# Patient Record
Sex: Female | Born: 1970
Health system: Southern US, Community
[De-identification: ages and names within clinical notes are randomized; demographics above are authoritative.]

## PROBLEM LIST (undated history)

## (undated) DIAGNOSIS — R42 Dizziness and giddiness: Secondary | ICD-10-CM

## (undated) DIAGNOSIS — F1120 Opioid dependence, uncomplicated: Secondary | ICD-10-CM

## (undated) DIAGNOSIS — F4310 Post-traumatic stress disorder, unspecified: Secondary | ICD-10-CM

## (undated) DIAGNOSIS — G2581 Restless legs syndrome: Secondary | ICD-10-CM

## (undated) DIAGNOSIS — E663 Overweight: Secondary | ICD-10-CM

## (undated) DIAGNOSIS — R7301 Impaired fasting glucose: Secondary | ICD-10-CM

## (undated) DIAGNOSIS — M329 Systemic lupus erythematosus, unspecified: Secondary | ICD-10-CM

## (undated) DIAGNOSIS — K219 Gastro-esophageal reflux disease without esophagitis: Secondary | ICD-10-CM

## (undated) DIAGNOSIS — M79671 Pain in right foot: Secondary | ICD-10-CM

## (undated) DIAGNOSIS — I10 Essential (primary) hypertension: Secondary | ICD-10-CM

## (undated) DIAGNOSIS — E1165 Type 2 diabetes mellitus with hyperglycemia: Secondary | ICD-10-CM

## (undated) DIAGNOSIS — G4730 Sleep apnea, unspecified: Secondary | ICD-10-CM

## (undated) DIAGNOSIS — J301 Allergic rhinitis due to pollen: Secondary | ICD-10-CM

## (undated) DIAGNOSIS — M79609 Pain in unspecified limb: Secondary | ICD-10-CM

## (undated) DIAGNOSIS — R911 Solitary pulmonary nodule: Secondary | ICD-10-CM

## (undated) DIAGNOSIS — M797 Fibromyalgia: Secondary | ICD-10-CM

## (undated) DIAGNOSIS — Z720 Tobacco use: Secondary | ICD-10-CM

## (undated) DIAGNOSIS — I209 Angina pectoris, unspecified: Secondary | ICD-10-CM

## (undated) DIAGNOSIS — E119 Type 2 diabetes mellitus without complications: Secondary | ICD-10-CM

## (undated) DIAGNOSIS — E785 Hyperlipidemia, unspecified: Secondary | ICD-10-CM

## (undated) DIAGNOSIS — K754 Autoimmune hepatitis: Secondary | ICD-10-CM

## (undated) DIAGNOSIS — R7302 Impaired glucose tolerance (oral): Secondary | ICD-10-CM

## (undated) DIAGNOSIS — M1990 Unspecified osteoarthritis, unspecified site: Secondary | ICD-10-CM

## (undated) DIAGNOSIS — R110 Nausea: Secondary | ICD-10-CM

## (undated) DIAGNOSIS — G609 Hereditary and idiopathic neuropathy, unspecified: Secondary | ICD-10-CM

## (undated) DIAGNOSIS — H409 Unspecified glaucoma: Secondary | ICD-10-CM

## (undated) DIAGNOSIS — F329 Major depressive disorder, single episode, unspecified: Secondary | ICD-10-CM

## (undated) DIAGNOSIS — IMO0002 Reserved for concepts with insufficient information to code with codable children: Secondary | ICD-10-CM

## (undated) DIAGNOSIS — I880 Nonspecific mesenteric lymphadenitis: Secondary | ICD-10-CM

## (undated) DIAGNOSIS — M359 Systemic involvement of connective tissue, unspecified: Secondary | ICD-10-CM

## (undated) DIAGNOSIS — K589 Irritable bowel syndrome without diarrhea: Secondary | ICD-10-CM

## (undated) DIAGNOSIS — R0602 Shortness of breath: Secondary | ICD-10-CM

## (undated) DIAGNOSIS — M549 Dorsalgia, unspecified: Secondary | ICD-10-CM

## (undated) DIAGNOSIS — I499 Cardiac arrhythmia, unspecified: Secondary | ICD-10-CM

## (undated) DIAGNOSIS — I639 Cerebral infarction, unspecified: Secondary | ICD-10-CM

## (undated) DIAGNOSIS — F419 Anxiety disorder, unspecified: Secondary | ICD-10-CM

## (undated) DIAGNOSIS — I2510 Atherosclerotic heart disease of native coronary artery without angina pectoris: Secondary | ICD-10-CM

## (undated) DIAGNOSIS — F32A Depression, unspecified: Secondary | ICD-10-CM

## (undated) HISTORY — DX: Pain in right foot: M79.671

## (undated) HISTORY — DX: Post-traumatic stress disorder, unspecified: F43.10

## (undated) HISTORY — PX: BACK SURGERY: SHX140

## (undated) HISTORY — DX: Major depressive disorder, single episode, unspecified: F32.9

## (undated) HISTORY — PX: ABDOMINAL HYSTERECTOMY: SHX81

## (undated) HISTORY — PX: TOTAL ABDOMINAL HYSTERECTOMY W/ BILATERAL SALPINGOOPHORECTOMY: SHX83

## (undated) HISTORY — DX: Sleep apnea, unspecified: G47.30

## (undated) HISTORY — DX: Gastro-esophageal reflux disease without esophagitis: K21.9

## (undated) HISTORY — DX: Type 2 diabetes mellitus with hyperglycemia: E11.65

## (undated) HISTORY — PX: CHOLECYSTECTOMY: SHX55

## (undated) HISTORY — PX: TUBAL LIGATION: SHX77

## (undated) HISTORY — DX: Restless legs syndrome: G25.81

## (undated) HISTORY — DX: Cerebral infarction, unspecified: I63.9

## (undated) HISTORY — DX: Opioid dependence, uncomplicated: F11.20

## (undated) HISTORY — DX: Systemic lupus erythematosus, unspecified: M32.9

## (undated) HISTORY — DX: Atherosclerotic heart disease of native coronary artery without angina pectoris: I25.10

## (undated) HISTORY — DX: Overweight: E66.3

## (undated) HISTORY — DX: Morbid (severe) obesity due to excess calories: E66.01

## (undated) HISTORY — DX: Impaired fasting glucose: R73.01

## (undated) HISTORY — DX: Impaired glucose tolerance (oral): R73.02

## (undated) HISTORY — DX: Type 2 diabetes mellitus without complications: E11.9

## (undated) HISTORY — DX: Hereditary and idiopathic neuropathy, unspecified: G60.9

## (undated) HISTORY — DX: Allergic rhinitis due to pollen: J30.1

## (undated) HISTORY — DX: Essential (primary) hypertension: I10

## (undated) HISTORY — DX: Irritable bowel syndrome, unspecified: K58.9

## (undated) HISTORY — DX: Tobacco use: Z72.0

## (undated) HISTORY — DX: Solitary pulmonary nodule: R91.1

## (undated) HISTORY — DX: Nausea: R11.0

## (undated) HISTORY — PX: ESOPHAGOGASTRODUODENOSCOPY ENDOSCOPY: SHX5814

## (undated) HISTORY — DX: Dorsalgia, unspecified: M54.9

## (undated) HISTORY — DX: Systemic involvement of connective tissue, unspecified: M35.9

## (undated) HISTORY — PX: LIVER BIOPSY: SHX301

## (undated) HISTORY — DX: Fibromyalgia: M79.7

## (undated) HISTORY — DX: Nonspecific mesenteric lymphadenitis: I88.0

## (undated) HISTORY — DX: Hyperlipidemia, unspecified: E78.5

## (undated) HISTORY — DX: Dizziness and giddiness: R42

## (undated) HISTORY — DX: Reserved for concepts with insufficient information to code with codable children: IMO0002

## (undated) HISTORY — DX: Anxiety disorder, unspecified: F41.9

## (undated) HISTORY — DX: Pain in unspecified limb: M79.609

## (undated) HISTORY — DX: Depression, unspecified: F32.A

## (undated) HISTORY — DX: Autoimmune hepatitis: K75.4

## (undated) HISTORY — DX: Unspecified glaucoma: H40.9

---

## 2000-02-01 ENCOUNTER — Ambulatory Visit (HOSPITAL_COMMUNITY): Admission: RE | Admit: 2000-02-01 | Discharge: 2000-02-01 | Payer: Self-pay | Admitting: Pulmonary Disease

## 2000-02-01 ENCOUNTER — Encounter: Payer: Self-pay | Admitting: Pulmonary Disease

## 2000-06-15 ENCOUNTER — Emergency Department (HOSPITAL_COMMUNITY): Admission: EM | Admit: 2000-06-15 | Discharge: 2000-06-15 | Payer: Self-pay | Admitting: Emergency Medicine

## 2000-12-03 ENCOUNTER — Emergency Department (HOSPITAL_COMMUNITY): Admission: EM | Admit: 2000-12-03 | Discharge: 2000-12-03 | Payer: Self-pay | Admitting: Emergency Medicine

## 2001-01-06 ENCOUNTER — Emergency Department (HOSPITAL_COMMUNITY): Admission: EM | Admit: 2001-01-06 | Discharge: 2001-01-06 | Payer: Self-pay | Admitting: *Deleted

## 2001-01-06 ENCOUNTER — Encounter: Payer: Self-pay | Admitting: *Deleted

## 2001-02-05 ENCOUNTER — Other Ambulatory Visit: Admission: RE | Admit: 2001-02-05 | Discharge: 2001-02-05 | Payer: Self-pay | Admitting: Obstetrics and Gynecology

## 2001-03-27 ENCOUNTER — Emergency Department (HOSPITAL_COMMUNITY): Admission: EM | Admit: 2001-03-27 | Discharge: 2001-03-27 | Payer: Self-pay | Admitting: *Deleted

## 2001-06-13 ENCOUNTER — Inpatient Hospital Stay (HOSPITAL_COMMUNITY): Admission: RE | Admit: 2001-06-13 | Discharge: 2001-06-15 | Payer: Self-pay | Admitting: *Deleted

## 2002-01-21 ENCOUNTER — Emergency Department (HOSPITAL_COMMUNITY): Admission: EM | Admit: 2002-01-21 | Discharge: 2002-01-21 | Payer: Self-pay | Admitting: Emergency Medicine

## 2002-01-21 ENCOUNTER — Encounter: Payer: Self-pay | Admitting: Emergency Medicine

## 2002-02-22 ENCOUNTER — Emergency Department (HOSPITAL_COMMUNITY): Admission: EM | Admit: 2002-02-22 | Discharge: 2002-02-22 | Payer: Self-pay | Admitting: Emergency Medicine

## 2002-08-01 ENCOUNTER — Encounter: Payer: Self-pay | Admitting: Emergency Medicine

## 2002-08-01 ENCOUNTER — Emergency Department (HOSPITAL_COMMUNITY): Admission: EM | Admit: 2002-08-01 | Discharge: 2002-08-01 | Payer: Self-pay | Admitting: Emergency Medicine

## 2002-08-11 ENCOUNTER — Ambulatory Visit (HOSPITAL_COMMUNITY): Admission: RE | Admit: 2002-08-11 | Discharge: 2002-08-11 | Payer: Self-pay | Admitting: Pulmonary Disease

## 2002-08-21 ENCOUNTER — Inpatient Hospital Stay (HOSPITAL_COMMUNITY): Admission: EM | Admit: 2002-08-21 | Discharge: 2002-08-26 | Payer: Self-pay | Admitting: Internal Medicine

## 2002-08-22 ENCOUNTER — Encounter (INDEPENDENT_AMBULATORY_CARE_PROVIDER_SITE_OTHER): Payer: Self-pay | Admitting: Internal Medicine

## 2002-09-24 ENCOUNTER — Ambulatory Visit (HOSPITAL_COMMUNITY): Admission: RE | Admit: 2002-09-24 | Discharge: 2002-09-24 | Payer: Self-pay | Admitting: Internal Medicine

## 2002-09-24 ENCOUNTER — Encounter (INDEPENDENT_AMBULATORY_CARE_PROVIDER_SITE_OTHER): Payer: Self-pay | Admitting: Internal Medicine

## 2002-11-18 ENCOUNTER — Encounter (HOSPITAL_COMMUNITY): Admission: RE | Admit: 2002-11-18 | Discharge: 2002-12-18 | Payer: Self-pay | Admitting: Pulmonary Disease

## 2002-11-20 ENCOUNTER — Encounter (INDEPENDENT_AMBULATORY_CARE_PROVIDER_SITE_OTHER): Payer: Self-pay | Admitting: Internal Medicine

## 2002-11-20 ENCOUNTER — Ambulatory Visit (HOSPITAL_COMMUNITY): Admission: RE | Admit: 2002-11-20 | Discharge: 2002-11-20 | Payer: Self-pay | Admitting: Internal Medicine

## 2002-11-28 ENCOUNTER — Encounter (INDEPENDENT_AMBULATORY_CARE_PROVIDER_SITE_OTHER): Payer: Self-pay | Admitting: Internal Medicine

## 2002-11-28 ENCOUNTER — Encounter (HOSPITAL_COMMUNITY): Admission: RE | Admit: 2002-11-28 | Discharge: 2002-12-28 | Payer: Self-pay | Admitting: Internal Medicine

## 2002-12-31 ENCOUNTER — Emergency Department (HOSPITAL_COMMUNITY): Admission: EM | Admit: 2002-12-31 | Discharge: 2002-12-31 | Payer: Self-pay | Admitting: *Deleted

## 2003-01-17 ENCOUNTER — Emergency Department (HOSPITAL_COMMUNITY): Admission: EM | Admit: 2003-01-17 | Discharge: 2003-01-17 | Payer: Self-pay | Admitting: Emergency Medicine

## 2003-03-16 ENCOUNTER — Encounter: Payer: Self-pay | Admitting: Emergency Medicine

## 2003-03-16 ENCOUNTER — Emergency Department (HOSPITAL_COMMUNITY): Admission: EM | Admit: 2003-03-16 | Discharge: 2003-03-16 | Payer: Self-pay | Admitting: Emergency Medicine

## 2003-03-26 ENCOUNTER — Encounter: Payer: Self-pay | Admitting: Emergency Medicine

## 2003-03-26 ENCOUNTER — Emergency Department (HOSPITAL_COMMUNITY): Admission: EM | Admit: 2003-03-26 | Discharge: 2003-03-26 | Payer: Self-pay | Admitting: *Deleted

## 2003-06-03 ENCOUNTER — Ambulatory Visit (HOSPITAL_COMMUNITY): Admission: RE | Admit: 2003-06-03 | Discharge: 2003-06-03 | Payer: Self-pay | Admitting: Internal Medicine

## 2003-06-11 ENCOUNTER — Emergency Department (HOSPITAL_COMMUNITY): Admission: EM | Admit: 2003-06-11 | Discharge: 2003-06-11 | Payer: Self-pay | Admitting: Emergency Medicine

## 2003-08-24 ENCOUNTER — Ambulatory Visit (HOSPITAL_COMMUNITY): Admission: RE | Admit: 2003-08-24 | Discharge: 2003-08-24 | Payer: Self-pay | Admitting: Orthopedic Surgery

## 2003-09-25 ENCOUNTER — Emergency Department (HOSPITAL_COMMUNITY): Admission: EM | Admit: 2003-09-25 | Discharge: 2003-09-25 | Payer: Self-pay | Admitting: Internal Medicine

## 2003-10-28 ENCOUNTER — Ambulatory Visit (HOSPITAL_COMMUNITY): Admission: RE | Admit: 2003-10-28 | Discharge: 2003-10-28 | Payer: Self-pay | Admitting: Neurology

## 2003-11-19 ENCOUNTER — Ambulatory Visit (HOSPITAL_COMMUNITY): Admission: RE | Admit: 2003-11-19 | Discharge: 2003-11-19 | Payer: Self-pay | Admitting: Internal Medicine

## 2003-12-06 ENCOUNTER — Emergency Department (HOSPITAL_COMMUNITY): Admission: EM | Admit: 2003-12-06 | Discharge: 2003-12-06 | Payer: Self-pay | Admitting: *Deleted

## 2004-02-19 ENCOUNTER — Ambulatory Visit (HOSPITAL_COMMUNITY): Admission: RE | Admit: 2004-02-19 | Discharge: 2004-02-19 | Payer: Self-pay | Admitting: Internal Medicine

## 2004-05-19 ENCOUNTER — Ambulatory Visit (HOSPITAL_COMMUNITY): Admission: RE | Admit: 2004-05-19 | Discharge: 2004-05-19 | Payer: Self-pay

## 2004-05-19 ENCOUNTER — Ambulatory Visit (HOSPITAL_COMMUNITY): Payer: Self-pay | Admitting: Internal Medicine

## 2004-06-23 ENCOUNTER — Ambulatory Visit: Payer: Self-pay | Admitting: Internal Medicine

## 2004-07-15 ENCOUNTER — Emergency Department (HOSPITAL_COMMUNITY): Admission: EM | Admit: 2004-07-15 | Discharge: 2004-07-15 | Payer: Self-pay | Admitting: *Deleted

## 2004-07-19 ENCOUNTER — Emergency Department (HOSPITAL_COMMUNITY): Admission: EM | Admit: 2004-07-19 | Discharge: 2004-07-19 | Payer: Self-pay | Admitting: Emergency Medicine

## 2004-09-15 ENCOUNTER — Ambulatory Visit (HOSPITAL_COMMUNITY): Admission: RE | Admit: 2004-09-15 | Discharge: 2004-09-15 | Payer: Self-pay | Admitting: Neurology

## 2004-09-26 ENCOUNTER — Ambulatory Visit: Payer: Self-pay | Admitting: Internal Medicine

## 2004-11-24 ENCOUNTER — Ambulatory Visit: Payer: Self-pay | Admitting: Orthopedic Surgery

## 2004-11-28 ENCOUNTER — Ambulatory Visit (HOSPITAL_COMMUNITY): Admission: RE | Admit: 2004-11-28 | Discharge: 2004-11-28 | Payer: Self-pay

## 2004-11-29 ENCOUNTER — Ambulatory Visit (HOSPITAL_COMMUNITY): Admission: RE | Admit: 2004-11-29 | Discharge: 2004-11-29 | Payer: Self-pay | Admitting: Pulmonary Disease

## 2004-11-30 ENCOUNTER — Encounter (HOSPITAL_COMMUNITY): Admission: RE | Admit: 2004-11-30 | Discharge: 2004-12-30 | Payer: Self-pay | Admitting: Orthopedic Surgery

## 2004-12-14 ENCOUNTER — Ambulatory Visit: Payer: Self-pay | Admitting: Orthopedic Surgery

## 2005-01-02 ENCOUNTER — Encounter (HOSPITAL_COMMUNITY): Admission: RE | Admit: 2005-01-02 | Discharge: 2005-02-01 | Payer: Self-pay | Admitting: Orthopedic Surgery

## 2005-02-27 ENCOUNTER — Ambulatory Visit: Payer: Self-pay | Admitting: Internal Medicine

## 2005-03-02 ENCOUNTER — Ambulatory Visit (HOSPITAL_COMMUNITY): Admission: RE | Admit: 2005-03-02 | Discharge: 2005-03-02 | Payer: Self-pay | Admitting: Internal Medicine

## 2005-03-03 ENCOUNTER — Ambulatory Visit (HOSPITAL_COMMUNITY): Admission: RE | Admit: 2005-03-03 | Discharge: 2005-03-03 | Payer: Self-pay | Admitting: Internal Medicine

## 2005-03-03 ENCOUNTER — Ambulatory Visit: Payer: Self-pay | Admitting: Internal Medicine

## 2005-03-30 ENCOUNTER — Ambulatory Visit (HOSPITAL_COMMUNITY): Admission: RE | Admit: 2005-03-30 | Discharge: 2005-03-30 | Payer: Self-pay | Admitting: Pulmonary Disease

## 2005-05-15 ENCOUNTER — Ambulatory Visit: Payer: Self-pay | Admitting: Internal Medicine

## 2005-05-25 ENCOUNTER — Ambulatory Visit: Payer: Self-pay | Admitting: Orthopedic Surgery

## 2005-06-16 ENCOUNTER — Ambulatory Visit (HOSPITAL_COMMUNITY): Admission: RE | Admit: 2005-06-16 | Discharge: 2005-06-16 | Payer: Self-pay | Admitting: Neurology

## 2005-08-14 ENCOUNTER — Ambulatory Visit: Payer: Self-pay | Admitting: Internal Medicine

## 2005-10-30 ENCOUNTER — Ambulatory Visit: Payer: Self-pay | Admitting: Orthopedic Surgery

## 2005-12-05 ENCOUNTER — Encounter (INDEPENDENT_AMBULATORY_CARE_PROVIDER_SITE_OTHER): Payer: Self-pay | Admitting: Pathology

## 2005-12-05 ENCOUNTER — Ambulatory Visit (HOSPITAL_COMMUNITY): Admission: RE | Admit: 2005-12-05 | Discharge: 2005-12-05 | Payer: Self-pay | Admitting: General Surgery

## 2006-02-21 ENCOUNTER — Ambulatory Visit: Payer: Self-pay | Admitting: Internal Medicine

## 2006-02-28 ENCOUNTER — Ambulatory Visit (HOSPITAL_COMMUNITY): Admission: RE | Admit: 2006-02-28 | Discharge: 2006-02-28 | Payer: Self-pay | Admitting: Pulmonary Disease

## 2006-05-24 ENCOUNTER — Encounter (HOSPITAL_COMMUNITY): Admission: RE | Admit: 2006-05-24 | Discharge: 2006-06-23 | Payer: Self-pay | Admitting: Rheumatology

## 2006-06-29 ENCOUNTER — Encounter (HOSPITAL_COMMUNITY): Admission: RE | Admit: 2006-06-29 | Discharge: 2006-07-09 | Payer: Self-pay | Admitting: Rheumatology

## 2006-07-24 ENCOUNTER — Encounter (HOSPITAL_COMMUNITY): Admission: RE | Admit: 2006-07-24 | Discharge: 2006-08-23 | Payer: Self-pay | Admitting: Rheumatology

## 2006-08-20 ENCOUNTER — Ambulatory Visit: Payer: Self-pay | Admitting: Cardiovascular Disease

## 2006-08-29 ENCOUNTER — Ambulatory Visit: Payer: Self-pay | Admitting: Cardiovascular Disease

## 2006-08-29 ENCOUNTER — Ambulatory Visit (HOSPITAL_COMMUNITY): Admission: RE | Admit: 2006-08-29 | Discharge: 2006-08-29 | Payer: Self-pay | Admitting: Cardiovascular Disease

## 2006-08-30 ENCOUNTER — Ambulatory Visit: Payer: Self-pay | Admitting: Internal Medicine

## 2006-10-09 ENCOUNTER — Ambulatory Visit: Payer: Self-pay | Admitting: Cardiovascular Disease

## 2006-10-13 ENCOUNTER — Emergency Department (HOSPITAL_COMMUNITY): Admission: EM | Admit: 2006-10-13 | Discharge: 2006-10-13 | Payer: Self-pay | Admitting: Emergency Medicine

## 2007-02-18 ENCOUNTER — Ambulatory Visit (HOSPITAL_COMMUNITY): Admission: RE | Admit: 2007-02-18 | Discharge: 2007-02-18 | Payer: Self-pay | Admitting: Pulmonary Disease

## 2007-04-01 ENCOUNTER — Emergency Department (HOSPITAL_COMMUNITY): Admission: EM | Admit: 2007-04-01 | Discharge: 2007-04-01 | Payer: Self-pay | Admitting: Emergency Medicine

## 2007-05-17 ENCOUNTER — Ambulatory Visit: Payer: Self-pay | Admitting: Cardiovascular Disease

## 2007-10-09 ENCOUNTER — Ambulatory Visit (HOSPITAL_COMMUNITY): Payer: Self-pay | Admitting: Psychiatry

## 2007-10-16 ENCOUNTER — Ambulatory Visit (HOSPITAL_COMMUNITY): Payer: Self-pay | Admitting: Psychiatry

## 2007-10-23 ENCOUNTER — Ambulatory Visit (HOSPITAL_COMMUNITY): Payer: Self-pay | Admitting: Psychiatry

## 2007-10-29 ENCOUNTER — Ambulatory Visit (HOSPITAL_COMMUNITY): Payer: Self-pay | Admitting: Psychiatry

## 2007-11-06 ENCOUNTER — Ambulatory Visit (HOSPITAL_COMMUNITY): Payer: Self-pay | Admitting: Psychiatry

## 2007-11-21 ENCOUNTER — Ambulatory Visit (HOSPITAL_COMMUNITY): Payer: Self-pay | Admitting: Psychiatry

## 2007-11-26 ENCOUNTER — Ambulatory Visit (HOSPITAL_COMMUNITY): Payer: Self-pay | Admitting: Psychiatry

## 2007-12-04 ENCOUNTER — Ambulatory Visit (HOSPITAL_COMMUNITY): Payer: Self-pay | Admitting: Psychiatry

## 2007-12-10 ENCOUNTER — Ambulatory Visit (HOSPITAL_COMMUNITY): Payer: Self-pay | Admitting: Psychiatry

## 2007-12-12 ENCOUNTER — Ambulatory Visit (HOSPITAL_COMMUNITY): Payer: Self-pay | Admitting: Psychiatry

## 2007-12-18 ENCOUNTER — Ambulatory Visit (HOSPITAL_COMMUNITY): Payer: Self-pay | Admitting: Psychiatry

## 2007-12-25 ENCOUNTER — Ambulatory Visit (HOSPITAL_COMMUNITY): Payer: Self-pay | Admitting: Psychiatry

## 2007-12-31 ENCOUNTER — Ambulatory Visit (HOSPITAL_COMMUNITY): Payer: Self-pay | Admitting: Psychiatry

## 2008-01-08 ENCOUNTER — Ambulatory Visit (HOSPITAL_COMMUNITY): Payer: Self-pay | Admitting: Psychiatry

## 2008-01-15 ENCOUNTER — Ambulatory Visit (HOSPITAL_COMMUNITY): Payer: Self-pay | Admitting: Psychiatry

## 2008-01-16 ENCOUNTER — Ambulatory Visit (HOSPITAL_COMMUNITY): Payer: Self-pay | Admitting: Psychiatry

## 2008-01-23 ENCOUNTER — Ambulatory Visit (HOSPITAL_COMMUNITY): Payer: Self-pay | Admitting: Psychiatry

## 2008-01-27 ENCOUNTER — Encounter: Payer: Self-pay | Admitting: Cardiology

## 2008-01-27 LAB — CONVERTED CEMR LAB
HCT: 39.4 %
MCV: 91 fL
WBC: 11.7 10*3/uL

## 2008-03-03 ENCOUNTER — Ambulatory Visit: Payer: Self-pay | Admitting: Cardiology

## 2008-03-04 ENCOUNTER — Encounter: Payer: Self-pay | Admitting: Cardiology

## 2008-03-04 LAB — CONVERTED CEMR LAB
Cholesterol: 178 mg/dL
Glucose, Bld: 76 mg/dL
HDL: 40 mg/dL
Triglyceride fasting, serum: 151 mg/dL

## 2008-03-24 ENCOUNTER — Ambulatory Visit (HOSPITAL_COMMUNITY): Payer: Self-pay | Admitting: Psychiatry

## 2008-03-26 ENCOUNTER — Ambulatory Visit (HOSPITAL_COMMUNITY): Payer: Self-pay | Admitting: Psychiatry

## 2008-04-03 ENCOUNTER — Ambulatory Visit (HOSPITAL_COMMUNITY): Payer: Self-pay | Admitting: Psychiatry

## 2008-04-30 ENCOUNTER — Ambulatory Visit (HOSPITAL_COMMUNITY): Payer: Self-pay | Admitting: Psychiatry

## 2008-05-06 ENCOUNTER — Ambulatory Visit (HOSPITAL_COMMUNITY): Payer: Self-pay | Admitting: Psychiatry

## 2008-05-26 ENCOUNTER — Ambulatory Visit (HOSPITAL_COMMUNITY): Payer: Self-pay | Admitting: Psychiatry

## 2008-06-02 ENCOUNTER — Ambulatory Visit (HOSPITAL_COMMUNITY): Admission: RE | Admit: 2008-06-02 | Discharge: 2008-06-02 | Payer: Self-pay | Admitting: Internal Medicine

## 2008-06-02 ENCOUNTER — Ambulatory Visit (HOSPITAL_COMMUNITY): Payer: Self-pay | Admitting: Psychiatry

## 2008-07-14 ENCOUNTER — Ambulatory Visit (HOSPITAL_COMMUNITY): Payer: Self-pay | Admitting: Psychiatry

## 2008-07-23 ENCOUNTER — Ambulatory Visit (HOSPITAL_COMMUNITY): Payer: Self-pay | Admitting: Psychiatry

## 2008-08-23 ENCOUNTER — Emergency Department (HOSPITAL_COMMUNITY): Admission: EM | Admit: 2008-08-23 | Discharge: 2008-08-23 | Payer: Self-pay | Admitting: Emergency Medicine

## 2008-10-23 ENCOUNTER — Emergency Department (HOSPITAL_COMMUNITY): Admission: EM | Admit: 2008-10-23 | Discharge: 2008-10-23 | Payer: Self-pay | Admitting: Emergency Medicine

## 2008-10-30 ENCOUNTER — Ambulatory Visit (HOSPITAL_COMMUNITY): Admission: RE | Admit: 2008-10-30 | Discharge: 2008-10-30 | Payer: Self-pay | Admitting: Pulmonary Disease

## 2008-11-23 ENCOUNTER — Encounter (HOSPITAL_COMMUNITY): Admission: RE | Admit: 2008-11-23 | Discharge: 2008-12-04 | Payer: Self-pay | Admitting: Pulmonary Disease

## 2008-12-03 ENCOUNTER — Emergency Department (HOSPITAL_COMMUNITY): Admission: EM | Admit: 2008-12-03 | Discharge: 2008-12-03 | Payer: Self-pay | Admitting: Emergency Medicine

## 2008-12-25 ENCOUNTER — Ambulatory Visit (HOSPITAL_COMMUNITY): Admission: RE | Admit: 2008-12-25 | Discharge: 2008-12-25 | Payer: Self-pay | Admitting: Pulmonary Disease

## 2009-01-06 DIAGNOSIS — R609 Edema, unspecified: Secondary | ICD-10-CM

## 2009-01-06 DIAGNOSIS — IMO0001 Reserved for inherently not codable concepts without codable children: Secondary | ICD-10-CM

## 2009-07-12 ENCOUNTER — Ambulatory Visit (HOSPITAL_COMMUNITY): Admission: RE | Admit: 2009-07-12 | Discharge: 2009-07-12 | Payer: Self-pay | Admitting: Pulmonary Disease

## 2009-10-03 ENCOUNTER — Emergency Department (HOSPITAL_COMMUNITY): Admission: EM | Admit: 2009-10-03 | Discharge: 2009-10-03 | Payer: Self-pay | Admitting: Emergency Medicine

## 2009-12-21 ENCOUNTER — Encounter (HOSPITAL_COMMUNITY): Admission: RE | Admit: 2009-12-21 | Discharge: 2010-01-20 | Payer: Self-pay | Admitting: Anesthesiology

## 2010-01-21 ENCOUNTER — Encounter (HOSPITAL_COMMUNITY): Admission: RE | Admit: 2010-01-21 | Discharge: 2010-02-20 | Payer: Self-pay | Admitting: Anesthesiology

## 2010-04-11 ENCOUNTER — Encounter (INDEPENDENT_AMBULATORY_CARE_PROVIDER_SITE_OTHER): Payer: Self-pay | Admitting: *Deleted

## 2010-04-11 LAB — CONVERTED CEMR LAB
ALT: 21 units/L
Calcium: 9 mg/dL
Chloride: 104 meq/L
Creatinine, Ser: 0.59 mg/dL
Glucose, Bld: 150 mg/dL
MCV: 91.8 fL
Platelets: 308 10*3/uL
Potassium: 4.5 meq/L
RBC: 4.41 M/uL
Total Protein: 6.8 g/dL

## 2010-04-15 ENCOUNTER — Encounter (INDEPENDENT_AMBULATORY_CARE_PROVIDER_SITE_OTHER): Payer: Self-pay | Admitting: *Deleted

## 2010-04-18 ENCOUNTER — Ambulatory Visit (HOSPITAL_COMMUNITY): Admission: RE | Admit: 2010-04-18 | Discharge: 2010-04-18 | Payer: Self-pay | Admitting: Pulmonary Disease

## 2010-04-28 ENCOUNTER — Encounter (INDEPENDENT_AMBULATORY_CARE_PROVIDER_SITE_OTHER): Payer: Self-pay | Admitting: *Deleted

## 2010-04-28 ENCOUNTER — Ambulatory Visit: Payer: Self-pay | Admitting: Cardiology

## 2010-04-28 DIAGNOSIS — F17200 Nicotine dependence, unspecified, uncomplicated: Secondary | ICD-10-CM | POA: Insufficient documentation

## 2010-04-28 DIAGNOSIS — F1910 Other psychoactive substance abuse, uncomplicated: Secondary | ICD-10-CM

## 2010-04-28 DIAGNOSIS — J984 Other disorders of lung: Secondary | ICD-10-CM

## 2010-04-28 DIAGNOSIS — K754 Autoimmune hepatitis: Secondary | ICD-10-CM

## 2010-05-11 ENCOUNTER — Encounter: Payer: Self-pay | Admitting: Cardiology

## 2010-05-11 ENCOUNTER — Ambulatory Visit (HOSPITAL_COMMUNITY): Admission: RE | Admit: 2010-05-11 | Discharge: 2010-05-11 | Payer: Self-pay | Admitting: Cardiology

## 2010-05-12 ENCOUNTER — Ambulatory Visit: Payer: Self-pay | Admitting: Cardiology

## 2010-07-21 ENCOUNTER — Ambulatory Visit
Admission: RE | Admit: 2010-07-21 | Discharge: 2010-07-21 | Payer: Self-pay | Source: Home / Self Care | Attending: Cardiology | Admitting: Cardiology

## 2010-08-11 NOTE — Letter (Signed)
Summary: Stress Echocardiogram Information Sheet  [facility] at [facility]  [address], [place] [address]   Phone: [phone]  Fax: [phone]      April 28, 2010 MRN: [id number] light prior to the test.   Lindsay Walsh  Doctor: Appointment Date: Appointment Time: Appointment Location: [facility]  Stress Echocardiogram Information Sheet    Instructions:   1. DO NOT  take your furosemide,amlodipine,fexofenadine, medicine the morning of the test.  2. nothing to eat or drink  3. Dress prepared to exercise.  4. DO NOT use ANY caffine or tobacco products 3 hours before appointment.  5. Report to the Short Stay Center on the1st floor.  6. Please bring all current prescription medications.  7. If you have any questions, please call [phone]

## 2010-08-11 NOTE — Miscellaneous (Signed)
Summary: labs cbcd,cmp,04/11/2010  Clinical Lists Changes  Observations: Added new observation of CALCIUM: 9.0 mg/dL (09/81/1914 78:29) Added new observation of ALBUMIN: 3.9 g/dL (56/21/3086 57:84) Added new observation of PROTEIN, TOT: 6.8 g/dL (69/62/9528 41:32) Added new observation of SGPT (ALT): 21 units/L (04/11/2010 11:59) Added new observation of SGOT (AST): 24 units/L (04/11/2010 11:59) Added new observation of ALK PHOS: 84 units/L (04/11/2010 11:59) Added new observation of CREATININE: 0.59 mg/dL (44/07/270 53:66) Added new observation of BUN: 6 mg/dL (44/09/4740 59:56) Added new observation of BG RANDOM: 150 mg/dL (38/75/6433 29:51) Added new observation of CO2 PLSM/SER: 24 meq/L (04/11/2010 11:59) Added new observation of CL SERUM: 104 meq/L (04/11/2010 11:59) Added new observation of K SERUM: 4.5 meq/L (04/11/2010 11:59) Added new observation of NA: 138 meq/L (04/11/2010 11:59) Added new observation of PLATELETK/UL: 308 K/uL (04/11/2010 11:59) Added new observation of RDW: 13.2 % (04/11/2010 11:59) Added new observation of MCHC RBC: 34.3 g/dL (88/41/6606 30:16) Added new observation of MCV: 91.8 fL (04/11/2010 11:59) Added new observation of HCT: 40.5 % (04/11/2010 11:59) Added new observation of HGB: 13.9 g/dL (07/18/3233 57:32) Added new observation of RBC M/UL: 4.41 M/uL (04/11/2010 11:59) Added new observation of WBC COUNT: 9.5 10*3/microliter (04/11/2010 11:59)

## 2010-08-11 NOTE — Assessment & Plan Note (Signed)
Summary: ROV F1M   Visit Type:  Follow-up Primary Provider:  Dr. Kari Baars   History of Present Illness: [name] returns to the office as scheduled for continued assessment and treatment of chest discomfort.  A stress echocardiogram showed no evidence for myocardial ischemia.  She has done well symptomatically with occasional episodes that are relieved by treatment with sublingual nitroglycerin.  She describes a spell during which she felt dizzy without nausea or chest discomfort.  Using an automatic sphygmomanometer, she found her heart rate and blood pressure to be normal.  She has had no dyspnea, orthopnea, PND or pedal edema.  She has not experienced palpitations.   Current Medications (verified): 1)  Dicyclomine Hcl 10 Mg Caps (Dicyclomine Hcl) .... Take As Needed 2)  Cymbalta 30 Mg Cpep (Duloxetine Hcl) .... Take 1 Tab Two Times A Day 3)  Furosemide 40 Mg Tabs (Furosemide) .... Take 1 Tab Daily 4)  Nexium 40 Mg Cpdr (Esomeprazole Magnesium) .... Take 1 Tab Daily 5)  Amlodipine Besy-Benazepril Hcl 10-20 Mg Caps (Amlodipine Besy-Benazepril Hcl) .... Take 1 Tab Daily 6)  Methadone Hcl 5 Mg Tabs (Methadone Hcl) .... Take Qid 7)  Azathioprine 50 Mg Tabs (Azathioprine) .... Take 1 Tab Daily 8)  Vitamin C 100 Mg Tabs (Ascorbic Acid) .... Take 1 Tab Daily 9)  Fexofenadine Hcl 60 Mg Tabs (Fexofenadine Hcl) .... Take 1 Tab Two Times A Day 10)  Clonazepam 0.5 Mg Tabs (Clonazepam) .... Take As Needed 11)  Methocarbamol 500 Mg Tabs (Methocarbamol) .... Take 1 Tab Three Times A Day 12)  Pramipexole Dihydrochloride 0.5 Mg Tabs (Pramipexole Dihydrochloride) .... Take 1 Tab At Bedtime 13)  Zolpidem Tartrate 10 Mg Tabs (Zolpidem Tartrate) .... Take As Needed 14)  Oscal 500/200 D-3 500-200 Mg-Unit Tabs (Calcium-Vitamin D) .... Take 1 Tab Two Times A Day 15)  Levetiracetam 750 Mg Tabs (Levetiracetam) .... Take 1 Tab At Bedtime 16)  Alora 0.075 Mg/24hr Pttw (Estradiol) .... Apply 1 Time  Weekly 17)  Vitamin B-12 1000 Mcg Tabs (Cyanocobalamin) .... Take 1 Tab Daily 18)  Daily Multi  Tabs (Multiple Vitamins-Minerals) .... Take 1 Tab Daily 19)  Nitrostat 0.4 Mg Subl (Nitroglycerin) .Marland Kitchen.. 1 Tablet Under Tongue At Onset of Chest Pain; You May Repeat Every 5 Minutes For Up To 3 Doses.  Allergies (verified): 1)  ! Doxycycline  Comments:  Nurse/Medical Assistant: patient didn't bring med or list [facility] is pharmacy reviewed meds from previous med list  Past History:  PMH, FH, and Social History reviewed and updated.  Past Medical History: PVC's Hypertension Chest pain: normal myoview and echo-2008; normal stress echo 2011 Pedal edema Tobacco abuse: 30-40 pack years; continuing at one pack per day Globus hystericus; gastroesophageal reflux disease Fibromyalgia; irritable bowel syndrome Overweight; sleep apnea Autoimmune hepatitis Narcotic Dependence; depression Pulmonary Nodule    Review of Systems       See history of present illness.  Vital Signs:  Patient profile:   40 year old female Weight:      190 pounds O2 Sat:      97 % on Room air Pulse rate:   102 / minute BP sitting:   124 / 83  (left arm)  Vitals Entered By: Dreama Saa, CNA (July 21, 2010 11:40 AM)  O2 Flow:  Room air  Physical Exam  General:  Overweight; well developed; no acute distress:   Weight-190, 5 pounds less than at her last visit Neck-No JVD; no carotid bruits: Lungs-No tachypnea, no rales; no  rhonchi; no wheezes: Cardiovascular-normal PMI; normal S1 and S2: Abdomen-BS normal; soft and non-tender without masses or organomegaly:  Musculoskeletal-No deformities, no cyanosis or clubbing: Neurologic-Normal cranial nerves; symmetric strength and tone:  Skin-Warm, no significant lesions: Extremities-Nl distal pulses; no edema:     Impression & Recommendations:  Problem # 1:  CHEST PAIN (ICD-786.50) Symptoms are currently well controlled.  Possible etiologies include  coronary spasm, syndrome X, esophageal spasm, anxiety or a host of other conditions, which are less likely.  None of these is likely to be associated with significant risk for mortality or important morbidity.  She does not appear to have ischemic heart disease.  Additional testing would likely prove futile.  She wonders about her episode of dizziness.  With normal blood pressure and heart rate,  a significant arrhythmia is likely excluded.  I advised her that it is difficult to determine the etiology of her symptoms, and that we would consider performing additional testing for a recurrent event.  Problem # 2:  TOBACCO ABUSE (ICD-305.1) Patient initially was abstinent from tobacco with the use of transdermal patches, but subsequently experienced increased stress leading her to smoke again.  I emphasized the need for a continuing commitment and multiple quit attempts.  We have referred her to the [facility] where nicotine replacement therapy is currently available at no cost.  She will see the cardiology nurse in one month to review her progress.  I will reassess this nice woman in one year.  Patient Instructions: 1)  Your physician recommends that you schedule a follow-up appointment in: 1 year 2)  You have been referred to nurse visit in 1 month to assess tobacco cessation and bp check

## 2010-08-11 NOTE — Assessment & Plan Note (Signed)
Summary: F2Y PT LAST SEEN 02/2009 CP TIGHTNESS IN CHEST/TMJ   Visit Type:  Follow-up Primary Provider:  Dr. Kari Baars   History of Present Illness: [name] returns to the office as scheduled for continued assessment and treatment of chest discomfort.  She had done well following her most recent visit in August of 2009, but has intermittently experienced exacerbation of chest discomfort, most recently over the past few months.  She experiences a deep pain in the left chest with onset at rest.  Typical spells persist for approximately 45 minutes.  She is unaware of anything she can do to ameliorate or exacerbate her discomfort.  There is some associated dyspnea but not much in way of diaphoresis.  She has had intermittent nausea as well.  Current Medications (verified): 1)  Dicyclomine Hcl 10 Mg Caps (Dicyclomine Hcl) .... Take As Needed 2)  Cymbalta 30 Mg Cpep (Duloxetine Hcl) .... Take 1 Tab Two Times A Day 3)  Furosemide 40 Mg Tabs (Furosemide) .... Take 1 Tab Daily 4)  Nexium 40 Mg Cpdr (Esomeprazole Magnesium) .... Take 1 Tab Daily 5)  Amlodipine Besy-Benazepril Hcl 10-20 Mg Caps (Amlodipine Besy-Benazepril Hcl) .... Take 1 Tab Daily 6)  Methadone Hcl 5 Mg Tabs (Methadone Hcl) .... Take As Needed 7)  Azathioprine 50 Mg Tabs (Azathioprine) .... Take 1 Tab Daily 8)  Vitamin C 100 Mg Tabs (Ascorbic Acid) .... Take 1 Tab Daily 9)  Fexofenadine Hcl 60 Mg Tabs (Fexofenadine Hcl) .... Take 1 Tab Two Times A Day 10)  Clonazepam 0.5 Mg Tabs (Clonazepam) .... Take 1 Tab Two Times A Day 11)  Methocarbamol 500 Mg Tabs (Methocarbamol) .... Take 1 Tab Three Times A Day 12)  Pramipexole Dihydrochloride 0.5 Mg Tabs (Pramipexole Dihydrochloride) .... Take 1 Tab At Bedtime 13)  Zolpidem Tartrate 10 Mg Tabs (Zolpidem Tartrate) .... Take 1 Tab At Bedtime 14)  Oscal 500/200 D-3 500-200 Mg-Unit Tabs (Calcium-Vitamin D) .... Take 1 Tab Two Times A Day 15)  Levetiracetam 750 Mg Tabs  (Levetiracetam) .... Take 1 Tab At Bedtime 16)  Alora 0.075 Mg/24hr Pttw (Estradiol) .... Apply 1 Time Weekly 17)  Vitamin B-12 1000 Mcg Tabs (Cyanocobalamin) .... Take 1 Tab Daily 18)  Daily Multi  Tabs (Multiple Vitamins-Minerals) .... Take 1 Tab Daily 19)  Nitrostat 0.4 Mg Subl (Nitroglycerin) .Marland Kitchen.. 1 Tablet Under Tongue At Onset of Chest Pain; You May Repeat Every 5 Minutes For Up To 3 Doses.  Allergies (verified): 1)  ! Doxycycline  Comments:  Nurse/Medical Assistant: patient brought med bottles states she takes just like bottle reads  Past History:  PMH, FH, and Social History reviewed and updated.  Past Medical History: PVC's Hypertension CHEST PAIN-UNSPECIFIED (ICD-786.50) normal myoview 2008 normal echo 2008 Pedal edema Tobacco abuse: 30-40 pack years; continuing at one pack per day Globus hystericus; gastroesophageal reflux disease Fibromyalgia; irritable bowel syndrome Overweight; sleep apnea Autoimmune hepatitis Narcotic Dependence; depression Pulmonary Nodule    Social History: Disabled  Tobacco Use - Yes.  1 1/2 packs dailyx30 years Alcohol Use - yes - occasional Drug Use - no  Review of Systems  The patient denies weight loss, weight gain, syncope, peripheral edema, prolonged cough, and headaches.     Vital Signs:  Patient profile:   40 year old female Height:      60 inches Weight:      195 pounds BMI:     38.22 Pulse rate:   99 / minute BP sitting:   110 / 69  (  right arm)  Vitals Entered By: Dreama Saa, CNA (April 28, 2010 1:46 PM)  Physical Exam  General:  overweight white female in no distress. Weight is 195, which continues to increase from 177 in April.  HEENT:  Unremarkable. Carotids are without bruit.  No lymphadenopathy, thyromegaly, or JVP elevation. LUNGS:  Clear with good diaphragmatic motion.  No wheezing. S1 and S2.  Distant heart sounds.  PMI is not palpable. ABDOMEN:  Benign.  She is obese.  No tenderness.  No AAA.   No hepatosplenomegaly or hepatojugular reflux. Distal pulses are intact.  No edema. NEURO:  Nonfocal. SKIN:  Warm and dry.  No muscular weakness.    Impression & Recommendations:  Problem # 1:  CHEST PAIN (ICD-786.50) Chest discomfort persists.  A- stress nuclear study was performed nearly 4 years ago.  We will proceed with a stress echocardiogram.  Patient will try nitroglycerin for her symptoms.  Problem # 2:  HYPERTENSION (ICD-401.1) Blood pressure control is good with current medication, which will be continued.  Problem # 3:  TOBACCO ABUSE (ICD-305.1) Patient expressed an interest in discontinuing cigarette smoking.  She previously used transdermal nicotine systems with some benefit; however, she stopped when she could no longer afford to purchase these.  I explained that Medicaid will provide coverage for that type of smoking cessation therapy.  She will resume her efforts to discontinue tobacco use immediately.  I will reassess this nice woman in 2 months.  Other Orders: Stress Echo (Stress Echo)  Patient Instructions: 1)  Your physician recommends that you schedule a follow-up appointment in: 1 month 2)  Your physician has recommended you make the following change in your medication: nitrogllycerin 0.4mg  under tongue every 5 monutes x3 for chest pain, nicotine patches 3)  Your physician has requested that you have a stress echocardiogram. For further information please visit [email or web address]  Please follow instruction sheet as given. Prescriptions: NITROSTAT 0.4 MG SUBL (NITROGLYCERIN) 1 tablet under tongue at onset of chest pain; you may repeat every 5 minutes for up to 3 doses.  #25 x 3   Entered by:   Teressa Lower RN   Authorized by:   Kathlen Brunswick, MD, [facility]   Signed by:   Teressa Lower RN on 04/28/2010   Method used:   Electronically to        [facility] (retail)       [address]       [place], [place]  [address]        Ph: [phone]       Fax: [phone]   RxID:   [id number]

## 2010-08-22 ENCOUNTER — Encounter (INDEPENDENT_AMBULATORY_CARE_PROVIDER_SITE_OTHER): Payer: Self-pay | Admitting: *Deleted

## 2010-08-22 ENCOUNTER — Ambulatory Visit: Payer: Self-pay

## 2010-08-30 ENCOUNTER — Ambulatory Visit (INDEPENDENT_AMBULATORY_CARE_PROVIDER_SITE_OTHER): Payer: Medicare Other | Admitting: Internal Medicine

## 2010-08-30 DIAGNOSIS — R1011 Right upper quadrant pain: Secondary | ICD-10-CM

## 2010-08-30 DIAGNOSIS — R1013 Epigastric pain: Secondary | ICD-10-CM

## 2010-08-31 NOTE — Letter (Signed)
Summary: Appointment - Missed  [facility] [place]  [address] [place] [address]   Phone: [phone]  Fax: [phone]     August 22, 2010 MRN: [id number]   Lindsay Walsh [address] [place], [place]  [address]   Dear Ms. Hinote,  Our records indicate you missed your appointment on     08/22/10 NURSE VISIT.                                    It is very important that we reach you to reschedule this appointment. We look forward to participating in your health care needs. Please contact us at the number listed above at your earliest convenience to reschedule this appointment.     Sincerely,    Glass blower/designer

## 2010-09-13 ENCOUNTER — Encounter (HOSPITAL_BASED_OUTPATIENT_CLINIC_OR_DEPARTMENT_OTHER): Payer: Medicare Other | Admitting: Internal Medicine

## 2010-09-13 ENCOUNTER — Ambulatory Visit (HOSPITAL_COMMUNITY)
Admission: RE | Admit: 2010-09-13 | Discharge: 2010-09-13 | Disposition: A | Discharge status: Home / Self Care | Payer: Medicare Other | Source: Ambulatory Visit | Attending: Internal Medicine | Admitting: Internal Medicine

## 2010-09-13 DIAGNOSIS — I10 Essential (primary) hypertension: Secondary | ICD-10-CM | POA: Insufficient documentation

## 2010-09-13 DIAGNOSIS — Z79899 Other long term (current) drug therapy: Secondary | ICD-10-CM | POA: Insufficient documentation

## 2010-09-13 DIAGNOSIS — R1013 Epigastric pain: Secondary | ICD-10-CM

## 2010-09-13 DIAGNOSIS — K2960 Other gastritis without bleeding: Secondary | ICD-10-CM

## 2010-09-13 DIAGNOSIS — K219 Gastro-esophageal reflux disease without esophagitis: Secondary | ICD-10-CM

## 2010-09-13 DIAGNOSIS — R1011 Right upper quadrant pain: Secondary | ICD-10-CM

## 2010-09-13 HISTORY — PX: UPPER GASTROINTESTINAL ENDOSCOPY: SHX188

## 2010-10-10 NOTE — Consult Note (Signed)
Lindsay Walsh, Lindsay Walsh              ACCOUNT NO.:  [id number]  MEDICAL RECORD NO.:  [id number]           PATIENT TYPE:  AMB  LOCATION: [place]                    [facility]   PHYSICIAN:  Lionel December, M.D.    DATE OF BIRTH:  1971-05-25  DATE:08/30/2010                                 OFFICE VISIT.   PRESENTING COMPLAINT:  Recurrent epigastric and right upper quadrant pain.  The patient has history of autoimmune hepatitis and remains in remission.  HISTORY OF PRESENT ILLNESS:  Lindsay Walsh is a 40 year old Caucasian female patient of Dr. Juanetta Gosling who is here for scheduled visit.  She was last seen by me in [place] office.  She continues to complain of pain in her epigastric and right upper quadrant region.  She has had this pain for the past couple of years.  She states lately pain has gotten worse and experiences every day.  It is intermittent and at times may last for hours.  She states pain can be very intense and sort of immobilize her and she cannot even stand up or do anything until the pain goes away. She has been using heating pad which helps.  She is not sure if this pain is triggered with food or any activity.  She had laparoscopic cholecystectomy by Dr. Toy Cookey of [facility] in December 2010.  She states she was pain-free for only 8 or 10 days.  She did have chronic cholecystitis on a biopsy.  The patient was seen by Dr. Larey Dresser, her hepatologist at the [facility] on August 08, 2010.  He thought her upper GI tract should be examined to make sure she does not have PUD or any other lesion to account for these symptoms.  Her transaminases have remained normal.  She denies nausea or vomiting.  She states her appetite generally is good.  She has not lost any weight recently.  She has history of IBS. She states on most days she has 3-4 bowel movements and they could be loose to soft.  On her worse days, she may have 10 stools in a day and this happens no more than once  a month.  She states that if she does not taper Bentyl, stool frequency increases.  She does complain of intermittent bloating.  She denies melena, rectal bleeding, or dysphagia.  She states her heartburn is well controlled with PPI and diet.  CURRENT MEDICATIONS: 1. Fexofenadine 60 mg p.o. b.i.d. 2. Clonazepam 0.5 mg p.o. daily p.r.n. 3. Methadone 5 mg p.o. q.i.d. 4. Sulfamethoxazole DS b.i.d. 5. Nexium 40 mg p.o. q.a.m. 6. Azathioprine 50 mg daily. 7. Amlodipine/benazepril 10/20 daily. 8. Furosemide 40 mg daily. 9. Cymbalta 30 mg b.i.d. 10.Zolpidem 10 mg p.o. at bedtime p.r.n. 11.Dicyclomine 10 mg 3 times a day. 12.Pramipexole 0.25 mg p.o. at bedtime. 13.Methocarbamol 500 mg daily p.r.n. 14.Keppra 750 mg p.o. at bedtime. 15.Vitamin C 500 mg daily. 16.Vitamin B12 of 2.5 mg daily. 17.Nitroglycerin sublingual p.r.n.. 18.Os-Cal 500 mg b.i.d. 19.Vitamin D3 question dose b.i.d. 20.Centrum 1 daily. 21.Estradiol patch 0.075 mg patch once a week.  PAST MEDICAL HISTORY:  She has had 3 benign cysts removed from her left breast.  Last one was removed with Dr. Malvin Johns possibly last year.  She had BSO with hysterectomy in 2002 and prior to that, she had C-section x3.  She was diagnosed with autoimmune hepatitis in 2004 when she presented with bilirubin in teens and SGOT and SGPT over 2000.  She had repeat liver biopsy in 2007.  It is unclear whether or not she had a biopsy at the time of laparoscopic cholecystectomy.  At any rate, she has remained in remission for number of years.  Lab studies from January 2012 from [facility], her WBC was 8.5, hemoglobin 13.2, and platelet count 302,000.  Bilirubin was 0.4, AP 108, SGOT 30, SGPT 21, and albumin was 3.7.  IgG was 1340. INR was 1, and creatinine was 0.8.  She also has fibromyalgia, peripheral neuropathy, and IBS.  She has a chronic low back pain and she is being treated at the [facility] and she states she has had 3-4 injections.  She was  recently found to have a lung nodule by Dr. Juanetta Gosling and is scheduled to have a follow up scan in a few months.  She has chronic GERD and hypertension.  She also has arrhythmia, details not available.  ALLERGIES: 1. DOXYCYCLINE. 2. IODINE DYE. 3. FENTANYL PATCH.  FAMILY HISTORY:  Father is 66 years old, he is a [place] veteran and has multiple medical problems including CAD, DM, and neuropathy.  Mother is in good health.  She has a half-sister and half-brother who are both in good health.  SOCIAL HISTORY:  She is divorced.  She has 3 children.  Her son has some heart problems and goes to [employer].  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  She does not drink alcohol but still smoking about a pack a day which she has done for over 10 years.  OBJECTIVE:  VITAL SIGNS:  Weight 189 pounds, she is 60 inches tall. Pulse 74 per minute, blood pressure 126/70, and temperature is 98.7. HEENT:  Conjunctivae are pink.  Sclerae are nonicteric.  Oral pharyngeal mucosa is normal. NECK;  No neck masses or thyromegaly noted. CARDIAC:  Regular rhythm.  Normal S1 and S2.  No murmur or gallop noted. LUNGS:  Clear to auscultation. ABDOMEN:  Full.  Bowel sounds are normal.  On palpation, she has soft abdomen without hepatosplenomegaly.  She has mild mid epigastric tenderness as well as some tenderness below the right costal margin both on superficial and deep palpation. RECTAL:  Deferred. EXTREMITIES:  No peripheral edema or clubbing noted.  LABORATORY DATA:  As above.  ASSESSMENT:  Lindsay Walsh is a 40 year old Caucasian female who has history of autoimmune hepatitis and remains in biochemical histologic remission. She also has chronic gastroesophageal reflux disease with satisfactory control of her heartburn and irritable bowel syndrome with reasonable control of her bowel frequency.  She continues to experience pain in her epigastric region and right upper quadrant.  She had laparoscopic  cholecystectomy in December 2010, without sustained relief.  I suspect her pain is wall pain and maybe related to fibromyalgia or neuropathy.  We will proceed with diagnostic peptic ulcer disease as has been recommended by Dr. Larey Dresser to make sure we are not missing significant GI disease in upper GI tract that might explain her recurrent pain.  RECOMMENDATIONS:  Diagnostic esophagogastroduodenoscopy to be performed at [facility] in near future.  The patient counseled about the need to quit cigarette smoking.  If she needs some therapy, she will discuss with Dr. Juanetta Gosling on her next  visit.     Lionel December, M.D.     NR/MEDQ  D:  08/30/2010  T:  08/31/2010  Job:  [id number]  cc:   Ramon Dredge [name], M.D. Fax: [phone]  Larey Dresser, MD [facility]  Electronically Signed by Lionel December M.D. on 10/10/2010 09:35:28 AM

## 2010-10-10 NOTE — Op Note (Signed)
  NAMEFRAN, NEISWONGER              ACCOUNT NO.:  [id number]  MEDICAL RECORD NO.:  [id number]           PATIENT TYPE:  O  LOCATION:  [place]                          FACILITY:  [facility]  PHYSICIAN:  Lionel December, M.D.    DATE OF BIRTH:  Jun 27, 1971  DATE OF PROCEDURE:  09/13/2010 DATE OF DISCHARGE:                              OPERATIVE REPORT   PROCEDURE:  Esophagogastroduodenoscopy.  INDICATION:  Demoni is 40 year old Caucasian female with multiple medical problems including autoimmune hepatitis which was in remission, chronic GERD maintained on PPI, and history of IBS, who keeps experiencing epigastric in right upper quadrant pain.  At one point, it was felt her symptoms are due to biliary tract disease.  She had laparoscopic cholecystectomy at [facility] in December 2010, but without symptomatic relief.  She is undergoing diagnostic EGD to make sure she does not have peptic ulcer disease.  Procedures were reviewed with the patient. Informed consent was obtained.  MEDS FOR CONSCIOUS SEDATION:  Cetacaine spray for oropharyngeal topical anesthesia, Demerol 50 mg IV, Versed 8 mg IV in divided dose.  FINDINGS:  Procedure performed in endoscopy suite.  The patient's vital signs and O2 sat were monitored during the procedure and remained stable.  The patient was placed in left lateral recumbent position and Pentax videoscope was passed through oropharynx without any difficulty into esophagus.  Esophagus.  Mucosa of the esophagus normal.  GE junction was located at 36 cm from the incisors and was unremarkable.  Stomach.  It had moderate amount of food debris.  Examination of stomach was somewhat limited.  Multiple antral erosions were noted, however, no ulcer crater was found.  Pyloric channel was patent.  Angularis, fundus, and cardia were examined by retroflexion of scope and were normal.  Only a part of mucosa and gastric body was not seen because of food debris.  Duodenum.  Bulbar mucosa  was normal.  Scope was passed in second part of duodenum where mucosa and folds were normal.  Endoscope was withdrawn. The patient tolerated the procedure well.  FINAL DIAGNOSES: 1. No evidence of peptic ulcer disease. 2. Multiple antral erosions.  We will check her H. pylori serology. 3. Food debris in the stomach with a patent pylorus.  Suspect she may     have gastroparesis secondary to her meds.  She is on methadone     dicyclomine, etc.  RECOMMENDATIONS: 1. She will continue antireflux measures and Nexium as before.  The     patient advised to begin gastroparesis diet.  We will give her a     handout before she leaves the facility. 2. H. pylori serology to be to checked today.     Lionel December, M.D.     NR/MEDQ  D:  09/13/2010  T:  09/13/2010  Job:  [id number]  cc:   Dr. Juanetta Gosling  Dr. Larey Dresser [facility]  Electronically Signed by Lionel December M.D. on 10/10/2010 09:37:31 AM

## 2010-10-15 ENCOUNTER — Emergency Department (HOSPITAL_COMMUNITY)
Admission: EM | Admit: 2010-10-15 | Discharge: 2010-10-15 | Disposition: A | Discharge status: Home / Self Care | Payer: Medicare Other | Attending: Emergency Medicine | Admitting: Emergency Medicine

## 2010-10-15 DIAGNOSIS — F17200 Nicotine dependence, unspecified, uncomplicated: Secondary | ICD-10-CM | POA: Insufficient documentation

## 2010-10-15 LAB — RAPID STREP SCREEN (MED CTR MEBANE ONLY): Streptococcus, Group A Screen (Direct): NEGATIVE

## 2010-10-18 ENCOUNTER — Ambulatory Visit (INDEPENDENT_AMBULATORY_CARE_PROVIDER_SITE_OTHER): Payer: Medicare Other | Admitting: Psychiatry

## 2010-10-18 DIAGNOSIS — F331 Major depressive disorder, recurrent, moderate: Secondary | ICD-10-CM

## 2010-10-19 NOTE — Progress Notes (Signed)
[name], Lindsay Walsh              ACCOUNT NO.:  [id number]  MEDICAL RECORD NO.:  [id number]           PATIENT TYPE:  A  LOCATION:  [facility]                           FACILITY:  [facility]  PHYSICIAN:  Jailey Booton T. Jaeley Wiker, M.D.   DATE OF BIRTH:  [birth year]                                PROGRESS NOTE   HISTORY OF PRESENT ILLNESS: The patient is 40 year old Caucasian female who has been seen in this office, the last time in January 2010.  She came again today after she was feeling more depressed and having crying spells and needed help. The patient told that she stopped coming because she was feeling better but for the past few months, she feels that her depression is getting worse.  She did not mention any acute stressor however reported that she still dealing with her chronic illness, lack of family support, and going through the grief of losing her ex-boyfriend a few years ago.  The patient's current medications remain the same.  She is still on Cymbalta 30 mg twice a day, Klonopin 0.5 mg twice a day, and Ambien 6.25 mg as needed.  She endorses poor sleep, decreased energy, and frequent mood swings.  She stays most of this time to herself but at times feeling hopeless and helpless.  However, she denies any suicidal thinking, homicidal thinking, or any paranoia.  She recently moved from her apartment to her grandmother's house who died last year, and now her aunt who inherited that house is renting to her.  She feels better since she moved one week ago from her apartment to her grandmother's house. She is more comfortable there.  She is living with her 73 sons, 20 years old and 64 years old, and her 37 year old daughter lives on her own. Her medical condition remains the same.  She is still taking multiple medications for her physical illness.  She has a history of autoimmune hepatitis, peripheral neuropathy, fibromyalgia, history of benign cyst removed from her left breast, history of hysterectomy,  and IBS.  She also has chronic low back pain which she is getting treated at pain clinic at [facility].  She has recently found a lung nodule by Dr. Juanetta Gosling and she is scheduled to have a follow-up scan in a few months.  She also has a chronic heart condition.  She has chronic hypertension.  CURRENT MEDICATIONS: 1. Fexofenadine 60 mg twice a day. 2. Clonazepam 0.5 mg twice a day as needed. 3. Methadone 5 mg q.i.d. 4. Sulfamethazole DS b.i.d. 5. Nexium 40 mg in the morning. 6. Azathioprine  50 mg daily. 7. Amlodipine/benazepril 10/20 mg daily. 8. Lasix 40 mg daily. 9. Cymbalta 30 mg twice a day. 10.Ambien 6.25 mg daily as needed. 11.Dicyclomine 10 mg 3 times a day. 12.Methocarbamol 500 mg as needed. 13.Paxil 0.25 mg at bedtime. 14.Keppra 750 mg at bedtime. 15.Vitamin C 500 mg daily. 16.Vitamin B12 daily. 17.Nitroglycerin sublingual p.r.n. 18.Calcium 500 mg twice a day. 19.Vitamin D3 twice a day. 20.Estradiol patch 0.075 once a week.  She is seeing a doctor at [facility] for her chronic liver disease.  So far, she was told that her  liver has been stable with current medication. However, if these current medications stop working, then she may need to go on liver transplant list.  MENTAL STATUS EXAMINATION: The patient is a middle-aged woman, mildly obese, casually dressed, had fair eye contact.  She is tearful and anxious and described her mood as depressed and her affect was constricted.  There was no suicidal thinking, homicidal thinking, paranoia, or delusion present.  Her thought processes were linear, logical, and goal-directed.  Her speech is also soft and coherent.  She is alert and oriented x3.  There was no delusion or obsession noted.  Her attention and concentration were okay. Her insight, judgement, and impulse control were okay.  DIAGNOSIS: Axis I:  Major depressive disorder, recurrent. Axis II:  Deferred. Axis III:  See medical history. Axis IV:  Chronic psychiatric  illness, multiple medical problems, limited support. Axis V:  60.  PLAN: I talked to the patient in detail and discuss about her risk and benefits of medication.  At this time, she is taking multiple medications including Cymbalta, Klonopin, and Ambien with no side effects.  The patient is very reluctant to try a new medication given the fact that she has autoimmune hepatitis, however she shows a great interest for counseling in this office.  She has seen in the past Peggy Bynum in our office which had helped her a lot.  I do believe that she will get much help if she resumed the counseling in this office. However, if she does not show much improvement with the therapy, I am going to reconsider changing her antidepressant.  I have explained the risks and benefits of medication in detail.  I will see her again in 6 weeks and the patient is scheduled to see a therapist in this office as soon as possible.     Emery Binz T. Lolly Mustache, M.D.     STA/MEDQ  D:  10/18/2010  T:  10/18/2010  Job:  [id number]  Electronically Signed by Kathryne Sharper M.D. on 10/19/2010 01:57:08 PM

## 2010-10-25 LAB — COMPREHENSIVE METABOLIC PANEL
AST: 23 U/L (ref 0–37)
Alkaline Phosphatase: 82 U/L (ref 39–117)
Calcium: 8.6 mg/dL (ref 8.4–10.5)
GFR calc Af Amer: >60 mL/min (ref >60)
GFR calc non Af Amer: >60 mL/min (ref >60)
Total Bilirubin: 0.7 mg/dL (ref 0.3–1.2)
Total Protein: 6.7 g/dL (ref 6.0–8.3)

## 2010-10-25 LAB — DIFFERENTIAL
Basophils Absolute: 0 10*3/uL (ref 0.0–0.1)
Eosinophils Absolute: 0.1 10*3/uL (ref 0.0–0.7)
Lymphocytes Relative: 3 % — ABNORMAL LOW (ref 12–46)
Monocytes Absolute: 0.7 10*3/uL (ref 0.1–1.0)
Monocytes Relative: 5 % (ref 3–12)
Neutro Abs: 13.8 10*3/uL — ABNORMAL HIGH (ref 1.7–7.7)
Neutrophils Relative %: 92 % — ABNORMAL HIGH (ref 43–77)

## 2010-10-25 LAB — URINALYSIS, ROUTINE W REFLEX MICROSCOPIC

## 2010-10-25 LAB — LIPASE, BLOOD: Lipase: 25 U/L (ref 11–59)

## 2010-10-25 LAB — CBC
MCHC: 34.9 g/dL (ref 30.0–36.0)
Platelets: 254 10*3/uL (ref 150–400)
RBC: 4.28 MIL/uL (ref 3.87–5.11)

## 2010-11-11 ENCOUNTER — Ambulatory Visit (INDEPENDENT_AMBULATORY_CARE_PROVIDER_SITE_OTHER): Payer: Medicare Other | Admitting: Psychiatry

## 2010-11-11 DIAGNOSIS — F331 Major depressive disorder, recurrent, moderate: Secondary | ICD-10-CM

## 2010-11-22 ENCOUNTER — Encounter (INDEPENDENT_AMBULATORY_CARE_PROVIDER_SITE_OTHER): Payer: Medicare Other | Admitting: Psychiatry

## 2010-11-22 DIAGNOSIS — F331 Major depressive disorder, recurrent, moderate: Secondary | ICD-10-CM

## 2010-11-22 NOTE — Letter (Signed)
March 03, 2008    Ramon Dredge [name], M.D.  [address]  [place], [place] [address]   RE:  RUHI, KOPKE  MRN:  [id number]  /  DOB:  06-13-1971   Dear Ed:   Ms. Demma returns to the office, having originally been followed by  me, subsequently by Dr. [name] and now back to my practice.  She first  presented in 1998 with chest discomfort.  This was attributed to  fibromyalgia.  She also has had mild hypertension that has been  extremely well controlled with medical therapy.  Lipid status has been  normal.  She has not had diabetes.  She is followed at [facility] for  autoimmune hepatitis.  Previous cardiac testing showed only frequent  PVCs that were not clearly symptomatic.  She had a negative stress  nuclear study in 2008 with normal left ventricular systolic function.   Since she last saw Dr. [name], she has done well.  Pain control is much  improved - she is no longer using narcotics.  She does intermittently  have chest discomfort that resolved spontaneously.  There is no  relationship to exertion.   CURRENT MEDICATIONS:  1. Imuran 50 mg daily.  2. Norflex 100 mg b.i.d.  3. Bentyl 10 mg q.i.d. p.r.n.  4. Multivitamin.  5. Os-Cal with vitamin D.  6. Estradiol.  7. Lotrel 10/20 mg daily.  8. Furosemide 20 mg every other day p.r.n.  9. Keppra 750 mg nightly.  10.Protonix 20 mg daily.  11.Diazepam 5 mg t.i.d. p.r.n.  12.Transdermal lidocaine.  13.Cymbalta 60 mg daily.   PHYSICAL EXAMINATION:  GENERAL:  Overweight pleasant woman.  VITAL SIGNS:  The weight is 186, 1 pound more than November 2008.  Blood  pressure 105/70, heart rate 75 and regular, respirations 15.  NECK:  No jugular venous distention; no carotid bruits.  LUNGS:  Clear.  CARDIAC:  Normal first and second heart sounds.  ABDOMEN:  Soft and nontender; no masses; no organomegaly.  EXTREMITIES:  Trace edema; decreased to absent dorsalis pedis pulses;  adequate posterior tibial pulses.   IMPRESSION:  Ms.  Mcafee is doing much better in terms of her  fibromyalgia and pain management.  Blood pressure control remains  excellent.  A chemistry profile and lipid profile will be obtained.  If  results are good, I will reassess this nice woman in 1 year.    Sincerely,      Gerrit Friends. Dietrich Pates, MD, [facility]  Electronically Signed    RMR/MedQ  DD: 03/03/2008  DT: 03/04/2008  Job #: [id number]

## 2010-11-22 NOTE — Assessment & Plan Note (Signed)
[facility]                       [place] [facility]ARDIOLOG[facility] OFFICE NOTE   Lindsay Walsh, Lindsay Walsh                     MRN:          [id number]  DATE:05/17/2007                            DOB:          05/14/1971    Lindsay Walsh returns today for followup.  I have seen her for somatic  complaints before.  She has significant fibromyalgia over the last 3-4  years.  When I last saw her, she desperately needed to simplify her  medications.  She told me that she has been taken off Lyrica since I  last saw her.  She also has gone to [facility] and had some sort of L4-5 nerve  block.  She has ongoing appointments up there.  She continues to have  atypical chest pain.  The pain is sharp in the center of her chest.  It  is not related to exertion.  She gets occasional lightheadedness with  it.  There is no associated diaphoresis or shortness of breath.  The  pain can happen 1-2 times per week.  It is the same pain she has had all  along.  She had a nonischemic Myoview in February of this year and a  normal echo as well.  I am convinced that these pains are noncardiac in  etiology.   REVIEW OF SYSTEMS:  Remarkable for persistent pain, particularly in her  lower back, otherwise negative.   CURRENT MEDICATIONS:  1. Aciphex 20 a day.  2. Imuran 50 a day.  3. Lexapro 10 t.i.d.  4. Norflex 100 b.i.d.  5. Bentyl 10 p.r.n.  6. Multivitamins.  7. Os-Cal.  8. Estradiol.  9. Lotrel.  10.Lasix 20 a day.  11.Keppra 750 nightly.  12.Opana, at least 10 mg once daily.   PHYSICAL EXAMINATION:  Exam is remarkable for a somewhat lethargic,  overweight white female in no distress.  Blood pressure is 110/68.  Weight is 185, which continues to increase  from 177 in April.  Pulse is 86 and regular.  Afebrile.  Respiratory  rate 14.  HEENT:  Unremarkable.  Carotids are without bruit.  No lymphadenopathy, thyromegaly, or JVP  elevation.  LUNGS:  Clear with good diaphragmatic motion.  No  wheezing.  S1 and S2.  Distant heart sounds.  PMI is not palpable.  ABDOMEN:  Benign.  She is obese.  No tenderness.  No AAA.  No  hepatosplenomegaly or hepatojugular reflux.  Distal pulses are intact.  No edema.  NEURO:  Nonfocal.  SKIN:  Warm and dry.  No muscular weakness.   IMPRESSION:  1. Atypical chest pain, no evidence of coronary artery disease.      Nonischemic Myoview.  Continue baby aspirin daily.  Previous history of hypertension, now quite low.  Apparently, the  patient has stopped her Azor and was switched to Lotrel by her primary  care doctor.  In either case, she seems to be doing fine with it, and  there is no evidence of significant rebound hypertension at this time.  1. Fibromyalgia:  Continued problems regarding polypharmacy.  At least      her Lyrica has been stopped.  Follow  up with her primary care      doctor at [facility] in regards to trying to simplify her regimen.  2. Opiate dependence:  Patient is apparently trying to get off her      Opana.  This is part of the reason she has gone to [facility] for nerve      injections.  She understands that this is an addictive drug, and I      will leave it up to the people at [facility] to see if she can be weaned      off it.  3. Fibromyalgia:  The root cause of all of the patient's problems,      polypharmacy, and general disability.  Follow up with primary care      doctor at [facility].  4. Lower extremity edema, mild:  Currently well controlled on 20 of      Lasix daily.  Elevate legs at the end of the day.  Avoid salt.   Overall, Lindsay Walsh's heart is doing fine.  I will see her back in six  months.     Lindsay Walsh. [name], MD, [facility]  Electronically Signed    PCN/MedQ  DD: 05/17/2007  DT: 05/18/2007  Job #: [id number]

## 2010-11-25 ENCOUNTER — Encounter (HOSPITAL_COMMUNITY): Payer: Medicare Other | Admitting: Psychiatry

## 2010-11-25 ENCOUNTER — Emergency Department (HOSPITAL_COMMUNITY)
Admission: EM | Admit: 2010-11-25 | Discharge: 2010-11-25 | Disposition: A | Discharge status: Home / Self Care | Payer: Medicare Other | Attending: Emergency Medicine | Admitting: Emergency Medicine

## 2010-11-25 DIAGNOSIS — R1310 Dysphagia, unspecified: Secondary | ICD-10-CM | POA: Insufficient documentation

## 2010-11-25 DIAGNOSIS — IMO0001 Reserved for inherently not codable concepts without codable children: Secondary | ICD-10-CM | POA: Insufficient documentation

## 2010-11-25 DIAGNOSIS — I10 Essential (primary) hypertension: Secondary | ICD-10-CM | POA: Insufficient documentation

## 2010-11-25 DIAGNOSIS — K219 Gastro-esophageal reflux disease without esophagitis: Secondary | ICD-10-CM | POA: Insufficient documentation

## 2010-11-25 DIAGNOSIS — F19982 Other psychoactive substance use, unspecified with psychoactive substance-induced sleep disorder: Secondary | ICD-10-CM | POA: Insufficient documentation

## 2010-11-25 DIAGNOSIS — K589 Irritable bowel syndrome without diarrhea: Secondary | ICD-10-CM | POA: Insufficient documentation

## 2010-11-25 DIAGNOSIS — Z79899 Other long term (current) drug therapy: Secondary | ICD-10-CM | POA: Insufficient documentation

## 2010-11-25 DIAGNOSIS — T43205A Adverse effect of unspecified antidepressants, initial encounter: Secondary | ICD-10-CM | POA: Insufficient documentation

## 2010-11-25 NOTE — Group Therapy Note (Signed)
   NAME:  Lindsay Walsh, Lindsay Walsh                        ACCOUNT NO.:  [id number]   MEDICAL RECORD NO.:  [id number]                   PATIENT TYPE:  INP   LOCATION:  A303                                 [facility]ACILITY:  [facility]   PHYSICIAN:  Edward [name], M.D.             DATE OF BIRTH:  [birth year]   DATE OF PROCEDURE:  DATE OF DISCHARGE:                                   PROGRESS NOTE   PROBLEMS:  Autoimmune hepatitis.   SUBJECTIVE:  The patient says she is feeling pretty well and really has no  complaints this morning.  She denies any nausea, vomiting, fever, chills,  abdominal or chest pain.   PHYSICAL EXAMINATION:  GENERAL:  She looks much less jaundiced and has no  new complaints.  She is not having any pain.  She does not have any fever.   ASSESSMENT:  She is doing pretty well.   PLAN:  Continue treatments.  Continue medications.  Plan is to discharge  today.  Please see discharge summary for details.                                               Edward [name], M.D.    ELH/MEDQ  D:  08/26/2002  T:  08/26/2002  Job:  [id number]

## 2010-11-25 NOTE — Op Note (Signed)
   NAME:  Lindsay Walsh, Lindsay Walsh                        ACCOUNT NO.:  [id number]   MEDICAL RECORD NO.:  [id number]                   PATIENT TYPE:  INP   LOCATION:  A303                                 FACILITY:  [facility]   PHYSICIAN:  Lionel December, M.D.                 DATE OF BIRTH:  20-Nov-1970   DATE OF PROCEDURE:  08/22/2002  DATE OF DISCHARGE:                                 OPERATIVE REPORT   PREOPERATIVE DIAGNOSIS:   POSTOPERATIVE DIAGNOSIS:   PROCEDURE:  Percutaneous liver biopsy.   SURGEON:  Lionel December, M.D.   INDICATIONS:  The patient is a 40 year old Caucasian female with acute  hepatitis.  Viral markers are negative.  Sedimentation rate, ANA, and SMA  are also negative.  Her bilirubin is now over 20 mg.  She is suspected to  have autoimmune hepatitis.  She is undergoing percutaneous liver biopsy.  The procedure and risks are reviewed with the patient and her family and  informed consent was obtained from her.   FINDINGS:  The procedure performed at bedside.  Earlier today she had liver  ultrasound which I have reviewed.   DESCRIPTION OF PROCEDURE:  I marked the site for liver biopsy.  The skin was  prepped in the usual fashion with Betadine solution. It was isolated using  sterile sheets.  Xylocaine 1% was injected into the skin and subcutaneous  tissue.  Deeper injection was made with a 22-gauge spinal needle.  A small  nick was made in the skin to facilitate entry of liver biopsy needle.  Liver  biopsy was performed using soft tissue _________ needle.  One pass was made  with the patient holding her breath at end expiration.  An adequate tissue  sample was obtained.   The puncture site was covered with band-aid.  The patient was asked to lie  on the right side.  The patient tolerated the procedure well.    FINAL DIAGNOSES:  1. Acute hepatitis, possibly autoimmune, status post percutaneous liver     biopsy.  2. The patient will be closely monitored over the next 4-6  hours.  We will     check the H&H in 4 hours.  As planned we will start her on steroids     today.                                                  Lionel December, M.D.    NR/MEDQ  D:  08/22/2002  T:  08/22/2002  Job:  [id number]   cc:   Ramon Dredge [name], M.D.  [address]  [place]  [place] [address]  Fax: [phone]

## 2010-11-25 NOTE — Discharge Summary (Signed)
[facility]  Patient:    Lindsay Walsh, Lindsay Walsh Visit Number: [id number] MRN: [id number]          Service Type: SUR Location: 4A A428 01 Attending Physician:  Jeri Cos. Dictated by:   Langley Gauss, M.D. Admit Date:  06/13/2001 Discharge Date: 06/15/2001                             Discharge Summary  DISCHARGE DIAGNOSES: 1. Menorrhagia with irregular menses. 2. Dysmenorrhea. 3. Dyspareunia.  PROCEDURE:  Total abdominal hysterectomy with bilateral salpingo-oophorectomy.  FOLLOWUP:  Follow up in the office in four days time for staple removal.  DISCHARGE MEDICATIONS: 1. Lortab 10/500 #30 with no refill. 2. Climara patch in place at 0.1 mg per day at the time of discharge.  LABORATORY DATA AND X-RAY FINDINGS:  Hemoglobin and hematocrit 13.2 and 37.5, postop day #1 10.4 and 29.9.  HOSPITAL COURSE:  The patient was admitted on June 13, 2001, at which time an uncomplicated TAH/BSO was performed.  Postoperatively, the patient did very well.  She did have a Foley catheter and a JP drain in place which were both removed on postop day #1.  After removal of the Foley catheter, the patient was able to ambulate and void without difficulty.  She had minimal hot flashes with the Climara patch in place.  The p.m. of postop day #1, the JP catheter was removed from the subcutaneous space.  On postop day #2, early in the morning, the patient had only had one episode of flatus; however, with continued ambulation.  On June 15, 2001, the patient had resumption of bowel function with excellent passage of flatus and abdomen noted to be soft, flat and nontender.  The patient was discharged to home on June 15, 2001. Dictated by:   Langley Gauss, M.D. Attending Physician:  Jeri Cos. DD:  06/17/01 TD:  06/17/01 Job: [id number]

## 2010-11-25 NOTE — H&P (Signed)
Lindsay Walsh, LEVERT              ACCOUNT NO.:  [id number]   MEDICAL RECORD NO.:  [id number]          PATIENT TYPE:  AMB   LOCATION:  DAY                           FACILITY:  [facility]   PHYSICIAN:  Jerolyn Shin C. [name], M.D.   DATE OF BIRTH:  Feb 24, 1971   DATE OF ADMISSION:  DATE OF DISCHARGE:  LH                                HISTORY & PHYSICAL   A 40 year old female with history of persistent breast mass which has been  present for about 6 weeks.  She was thought to have an inflammatory nodule  of her breast and was treated by Dr. Juanetta Gosling for over a month without  improvement.  She had a reaction to DOXYCYCLINE with increased heart rate  and shortness of breath.  The patient was seen in the office 2 weeks ago and  was noted to have 2 inflammatory masses of her breast, one at about the 3  o'clock position and another at about the 6 o'clock position.  She is  scheduled to have excision of the mass under anesthesia.   PAST MEDICAL HISTORY:  1.  Cervical and lumbar disk disease.  2.  Depression.  3.  Hypertension.  4.  Irritable bowel syndrome.  5.  Peripheral neuropathy.  6.  Autoimmune hepatitis.  7.  Fibromyalgia.  8.  Sleep apnea.  9.  Gastroesophageal reflux disease.   PAST SURGICAL HISTORY:  1.  C section x3.  2.  Hysterectomy.   MEDICATIONS:  1.  Diazepam 5 mg 1 or 2 every 6 hours as needed.  2.  Meclizine 25 mg 1 or 2 every 4 hours as needed.  3.  Ativan 1 mg 1 twice daily.  4.  Aciphex 20 mg daily.  5.  Lexapro 10 mg 3 daily.  6.  Benadryl 10 mg 3 times daily.  7.  Lasix 40 mg every other day.  8.  Potassium 20 mEq every other day.  9.  Morphine sulfate tablets 1 or 2 twice daily.  10. Prednisone in decreasing doses.  11. DynaCirc CR 10 mg daily.  12. Folic acid 400 mcg daily.  13. Biotin.  14. Os-Cal Plus D.  15. Multivitamins.   SOCIAL HISTORY:  She is disabled.  She smokes 1-1/2 packs per day.  She  drinks an occasional beer.   PHYSICAL EXAMINATION:  VITAL  SIGNS: Blood pressure 120/76, pulse 86,  respirations 18.  Weight 168 pounds.  HEENT:  Unremarkable except for keloid at the right earlobe.  NECK:  Supple.  No JVD, bruit, adenopathy, or thyromegaly.  CHEST: Clear to auscultation.  HEART: Regular rate and rhythm without murmur, gallop, or rub.  BREASTS: Small nodule at 3 o'clock position left breast and in the  inframammary region about 6 o'clock position left breast.  Right breast  normal.  ABDOMEN:  Soft and nontender.  No masses.  EXTREMITIES: No cyanosis, clubbing, or edema.  NEUROLOGIC: No focal deficits.   IMPRESSION:  Inflammatory masses, left breast.   PLAN:  Excisional biopsy, left breast.      Jerolyn Shin C. [name], M.D.  Electronically Signed  LCS/MEDQ  D:  12/04/2005  T:  12/04/2005  Job:  [id number]

## 2010-11-25 NOTE — Procedures (Signed)
NAMELOUVENIA, GOLOMB              ACCOUNT NO.:  [id number]   MEDICAL RECORD NO.:  [id number]          PATIENT TYPE:  OUT   LOCATION:  RAD                           FACILITY:  [facility]   PHYSICIAN:  [name]. [name], MD, FACCDATE OF BIRTH:  08-07-1970   DATE OF PROCEDURE:  08/29/2006  DATE OF DISCHARGE:                                ECHOCARDIOGRAM   PROCEDURE:  2D echocardiogram.   INDICATIONS:  Hypertension, palpitations, dyspnea.   Left ventricular cavity size was normal.  Ejection fraction was 60%.  There was no LVH.  No wall motion abnormalities.  Left atrium and right  sided cardiac chambers were normal.  There is no evidence of pulmonary  hypertension.  There is trivial TR.  Aortic valve was trileaflet and  structurally normal.  The aortic root was normal.  Subcostal imaging  revealed no atrial septal defect, no source of embolus, and no effusion.   M-mode measurements included an aortic diameter 27 mm, left atrial  diameter of 40 mm, septal thickness 9 mm, LV diastolic dimension 45 mm,  LV systolic dimension 32 mm.   IMPRESSION:  Normal 2D echocardiogram, ejection fraction 60%.      [name]. [name], MD, [facility]  Electronically Signed     PCN/MEDQ  D:  08/29/2006  T:  08/29/2006  Job:  [id number]   cc:   Ramon Dredge [name], M.D.  Fax: [phone]

## 2010-11-25 NOTE — Assessment & Plan Note (Signed)
[facility]                       [place] CARDIOLOGY OFFICE NOTE   Lindsay Walsh, Lindsay Walsh                     MRN:          [id number]  DATE:08/20/2006                            DOB:          Feb 12, 1971    Ms. Marin is a 40 year old patient referred by Dr. Juanetta Gosling for  multiple complaints. The patient has a fairly complicated past medical  history. She has chronic pain syndrome with fibromyalgia. She has  neuropathy. There is a history of autoimmune hepatitis, depression.   The patient has had palpitations in the past. She was evaluated in  [place] many years ago and she was not treated for this. Apparently,  there has been some labile blood pressure recently as well. Dr. Antoine Poche  started the patient on Azor 10/20 and this seems to have done the trick.  A lot of her blood pressure fluctuations also would seem to relate  around her pain control management.   The patient also has been having chest pain. The chest pain is somewhat  atypical. It is not necessarily exertional. It is central in her chest.  It can radiate to both shoulders. She denies any relationship to her  fibromyalgia. There is also no associated reflux, diaphoresis. She has  some chronic exertional dyspnea, but no cough or phlegm production.   REVIEW OF SYSTEMS:  Otherwise, remarkable for chronic fatigue.   PAST MEDICAL HISTORY:  As indicated. She has:  1. Autoimmune hepatitis.  2. Neuropathy.  3. Hypertension.  4. Depression.  5. Reflux.   She denies any allergies.   She has not had previous surgeries.   She is with her boyfriend today. She has three children who live in the  house. I believe their age ranges between 70 and 79. She smokes one to  two packs per day.   She can still walk, but is otherwise fairly sedentary.   FAMILY HISTORY:  Is noncontributory.   PHYSICAL EXAMINATION:  She is overweight. The blood pressure is 106/60,  pulse is 88 with  occasional PACs.  HEENT: Is normal. There are no carotid bruits. No lymphadenopathy. No  JVP distention.  LUNGS:  Are clear.  She has an S1, S2 without murmur, rub, gallop or click.  ABDOMEN: Is benign.  LOWER EXTREMITIES: Intact pulses. No edema.   CURRENT MEDICATIONS:  1. Azor 10/20.  2. Aciphex 20 a day.  3. Imuran 50 a day.  4. Potassium 10 a day.  5. Lexapro 10 t.i.d.  6. Norflex 100 b.i.d.  7. Lasix 40 a day.  8. Keppra 500 mg q nightly.  9. Bentyl 10 mg p.r.n.  10.Lyrica 50 t.i.d.  11.Estradiol 0.075 mg weekly.   EKG: Shows sinus rhythm with nonspecific ST-T wave changes. QT is mildly  prolonged at 390.   IMPRESSION:  Atypical chest pain in the setting of coronary risk factors  of hypertension and smoking. The patient also is having palpitations,  which sound like benign PACs or PVCs. She has chronic dyspnea, which may  be related to her smoking and sedentary lifestyle. The patient is on a  slew of medications for pain control.  I think for the time-being we should give her a CardioNet monitor for  three weeks and document and benign PACs. I do not think she is having  paroxysmal atrial fibrillation. Will also do a 2D echocardiogram to  assess RV and LV function in the setting of her dyspnea. She will have a  stress Myoview to rule out coronary disease given her chest pain.   I think that she can walk on a treadmill far enough to do a diagnostic  study.   She will continue her Azor for hypertension.   It would be nice if people taking care of her fibromyalgia can simplify  her pain regimen.   Her last set of LFTs were normal and it would appear that her autoimmune  hepatitis is under control.   Further recommendations will be based on the results of her test and  followup examination in four weeks.     [name]. [name], MD, [facility]  Electronically Signed    PCN/MedQ  DD: 08/20/2006  DT: 08/20/2006  Job #: [id number]   cc:   Ramon Dredge [name], M.D.

## 2010-11-25 NOTE — Op Note (Signed)
[name], Lindsay Walsh              ACCOUNT NO.:  [id number]   MEDICAL RECORD NO.:  [id number]          PATIENT TYPE:  AMB   LOCATION:  DAY                           FACILITY:  [facility]   PHYSICIAN:  Lionel December, M.D.    DATE OF BIRTH:  27-Dec-1970   DATE OF PROCEDURE:  03/03/2005  DATE OF DISCHARGE:                                 OPERATIVE REPORT   PROCEDURE:  Esophagogastroduodenoscopy.   INDICATION:  Donyelle is a 40 year old Caucasian female with recurrent  epigastric pain and nausea and vomiting. She has multiple medical problems  including autoimmune hepatitis, but this is felt to be in remission. She has  been able to come off steroids without any relapse. She had an ultrasound  earlier this week which was negative for cholelithiasis or other  abnormalities. She is undergoing diagnostic EGD.   Procedure and risks were reviewed with the patient, and informed consent was  obtained.   PREMEDICATION:  Cetacaine spray pharyngeal topical anesthesia, Demerol 25 mg  IV, Versed 10 mg IV.   FINDINGS:  Procedure performed in endoscopy suite. The patient's vital signs  and O2 saturation were monitored during the procedure and remained stable.  The patient was placed in left lateral position, and Olympus videoscope was  passed via oropharynx without any difficulty into esophagus.   Esophagus. Mucosa of the esophagus was normal. No varices were identified.  GE junction was wavy, located at 36 cm from the incisors.   Stomach. It was empty. Distended very well insufflation. Folds of proximal  stomach were normal. Examination of mucosa at body was normal. There was  antral/prepyloric erythema. Pyloric channel was wide open. Angularis, fundus  and cardia examined by retroflexing the scope and were normal.   Duodenum. Bulbar mucosa was normal. Scope was passed to the second part of  the duodenum where mucosa and folds were normal. Endoscope was withdrawn.  The patient tolerated the procedure  well.   FINAL DIAGNOSIS:  Nonerosive and had antral gastritis, otherwise normal EGD.  I do not believe these findings will explain the patient's symptomatology.   RECOMMENDATIONS:  1.  She will continue anti-reflux measures as before. Will increase her      Aciphex to 20 mg p.o. b.i.d. to see if it helps. Please note that she      has been on the PPI for chronic GERD.  2.  She will have LFTs next week as planned, and she will have H pylori      serology at that time.      Lionel December, M.D.  Electronically Signed     NR/MEDQ  D:  03/03/2005  T:  03/03/2005  Job:  [id number]   cc:   Juanetta Gosling, M.D.

## 2010-11-25 NOTE — H&P (Signed)
[facility]  Patient:    Lindsay Walsh, Lindsay Walsh Visit Number: [id number] MRN: [id number]          Service Type: SUR Location: 4A A428 01 Attending Physician:  Jeri Cos. Dictated by:   Langley Gauss, M.D. Admit Date:  06/13/2001 Discharge Date: 06/15/2001                           History and Physical  CHIEF COMPLAINT:  This is a 40 year old gravida 3 para 3, three prior low transverse cesarean sections, who is admitted for total abdominal hysterectomy and bilateral salpingo-oophorectomy.  HISTORY OF PRESENT ILLNESS:  The patient is noted to have a history of irregular menses with passage of small clots and significant dysmenorrhea. The patient states she has pain during nearly all times of the month either on the left, right, or both sides, and this sharp pain increases with onset of her menses.  She also experiences deep penetration dyspareunia, which has interfered with her life-style.  Pertinently, the patient denies any GI symptoms.  No complaint of chronic constipation, diarrhea.  No symptoms of irritable bowel syndrome.  No history of anxiety disorder.  Past medical history is significant for two prior laparoscopies with lysis of adhesions and ovarian cystectomy, most recently Nov 18, 1999 underwent laparoscopic procedure by myself - at which time lysis of multiple adhesions was performed, likely related to the previous cesarean sections.  The patient was complaining of left lower quadrant adnexal pain at that time and was noted to have a hemorrhagic cyst on the left ovary.  Pertinently, after this operative procedure the patient did have significant pain relief of the left adnexal pain x several months duration, but the pain has been rapidly worsening over the past six months such that she is taking Vioxx intermittently p.r.n. q.a.m. for pain relief.  The pain increases as the day progresses and she frequently requires intermittent use of  narcotics for pain relief.  Most recently ultrasound performed in the office on February 05, 2001 revealed hemorrhagic left ovarian cyst with free fluid identified within the pelvis.  Previous ultrasound on November 04, 1999 revealed small left ovarian cyst, no other abnormalities identified.  The patient at this point in time is having pain sufficient enough to require narcotic medication.  At times she has missed days of both work and school as a result of the pain.  She has been tried on multiple NSAIDs with only minimal improvement in her functioning.  SOCIAL HISTORY:  The patient is currently attending nursing school at [employer] and is also working part-time as a Electrical engineer.  She has had missed days of both work and school as a result of this at times very sharp incapacitating pain.  Does smoke one pack per day.  PAST MEDICAL HISTORY:  1.  As stated previously, two prior laparoscopies for evaluation of pelvic      pain.  2.  Three prior low transverse cesarean sections - in 1987, 1991, and 1992.  3.  Intraoperative tubal ligation performed in 1992 with cesarean section.  4.  Fractured right foot in 1994 and 1995.  Currently has some degree of      arthritis type pain in this foot.  CURRENT MEDICATIONS:  Hydrocodone p.r.n.  ALLERGIES:  1.  DOXYCYCLINE.  2.  IV CONTRAST.  PHYSICAL EXAMINATION:  GENERAL:  Healthy, alert, pleasant white female.  VITAL SIGNS:  Weight 165 pounds.  Height 5 feet 0  inches.  Blood pressure 134/89, pulse 85, respiratory rate 20.  HEENT:  Negative.  NECK:  No adenopathy.  Neck supple.  Thyroid not palpable.  LUNGS:  Clear.  CARDIOVASCULAR:  Regular rate and rhythm.  ABDOMEN:  Soft, nontender.  Pfannenstiel incision noted from prior cesarean sections.  Likewise, she has incisional scars from from laparoscopies.  EXTREMITIES:  Normal.  PELVIC:  Normal external genitalia.  Cervix noted to be without lesions.  On bimanual examination there is  moderate cervical motion tenderness.  The uterus is noted to be multiparous in size, tender to manipulation.  The right and left ovary are moderately tender to manipulation but no significant adnexal masses are appreciated.  LABORATORY DATA:  Most recent transvaginal ultrasound on February 05, 2001.  ASSESSMENT:  Patient with history of recurrent bilateral adnexal pain, with episodes of abnormal ultrasounds.  Pertinently, the patient did have transient improvement in adnexal pain after prior surgical procedures.  She likewise had significant adhesive disease which has been managed laparoscopically previously.  The patient, however, as noted previously, has had now acceleration of her pain interfering with her life-style.  Thus, at this point in time she is prepared to proceed with definitive surgical management. Initially we discussed with the patient performance of the hysterectomy to deal with the dysmenorrhea, menorrhagia, and chronic pelvic pain.  It is pertinent in the history that she has had episodes of hemorrhaging ovarian cyst both on the left and on the right, thus concern is that if the ovaries are left in place, particularly at the time of hysterectomy being performed, the patient will experience adhesive disease in the adnexa bilaterally and with cyst development will experience significant pain likely requiring reoperation in the future.  Thus, at this point in time the patient has decided to proceed with removal of the ovaries bilaterally.  She does understand that this will result in surgical menopause, which will necessitate immediate use of hormone replacement therapy to avoid the severe symptoms associated with surgical menopause.  Risks and benefits of the surgery were discussed with the patient to include risks of anesthesia, risk of hemorrhage and possible transfusion if indicated.  The patient also understands the risk of bowel or bladder injury.  The patient will be  admitted on June 13, 2001 for performance of total abdominal hysterectomy and bilateral  salpingo-oophorectomy through the previous Pfannenstiel incisions from cesarean section. Dictated by:   Langley Gauss, M.D. Attending Physician:  Jeri Cos. DD:  06/12/01 TD:  06/13/01 Job: [id number]

## 2010-11-25 NOTE — Group Therapy Note (Signed)
   NAME:  Lindsay Walsh, Lindsay Walsh                        ACCOUNT NO.:  [id number]   MEDICAL RECORD NO.:  [id number]                   PATIENT TYPE:  INP   LOCATION:  A303                                 FACILITY:  [facility]   PHYSICIAN:  Edward [name], M.D.             DATE OF BIRTH:  08/01/1970   DATE OF PROCEDURE:  DATE OF DISCHARGE:                                   PROGRESS NOTE   PROBLEM:  Autoimmune hepatitis.   SUBJECTIVE:  This patient says that she is feeling better; and, in general,  does look better.  She appears to be a bit less jaundiced.  Her exam shows  that she is more alert.  Her chest is clear.  Her heart is regular.  Her  abdomen is soft.   OBJECTIVE:  Lab work shows that her potassium is 3.2, glucose 123, bilirubin  is down to 5.7.  Her alkaline phosphatase 222, SGOT 377, SGPT 612.   ASSESSMENT:  She is doing much better.   PLAN:  I am going to leave her in the hospital 1 more day on prednisone and  she will probably be able to be discharged tomorrow as well as she is doing.                                               Edward [name], M.D.    ELH/MEDQ  D:  08/25/2002  T:  08/25/2002  Job:  [id number]

## 2010-11-25 NOTE — Consult Note (Signed)
NAME:  Lindsay Walsh, Lindsay Walsh                        ACCOUNT NO.:  [id number]   MEDICAL RECORD NO.:  [id number]                   PATIENT TYPE:  INP   LOCATION:  A303                                 FACILITY:  [facility]   PHYSICIAN:  Lionel December, M.D.                 DATE OF BIRTH:  [birth year]   DATE OF CONSULTATION:  08/21/2002  DATE OF DISCHARGE:                                   CONSULTATION   GASTROINTESTINAL CONSULTATION NOTE:   PRESENTING COMPLAINT:  Progressive hyperbilirubinemia in a patient with  recent diagnosis of icteric hepatitis.   HISTORY OF PRESENT ILLNESS:  This patient is a 40 year old Caucasian female  whose present illness began about 3 weeks ago with chest pain.  She also  noted not feeling well and having malaise around the same time.  She came to  have vague abdominal pain.  She was seen by Dr. Juanetta Gosling and sent over to  [facility] for upper abdominal ultrasound.  This study showed  thickened gallbladder wall, but there was no evidence of cholelithiasis or  dilated bile duct.  When she went to the hospital for her ultrasound she was  noted to be jaundiced by the staff member.  An ultrasound was done on  08/11/2002.  She was initially seen in consultation by Dr. Jena Gauss on  08/12/2002.  Her LFTs revealed bilirubin of 10.9, AP of 273, ASP of 1320,  ALT of 2274 (08/11/2002).  Dr. Jena Gauss felt that she had acute hepatitis  probably viral in origin.  Further studies included viral markers, hepatitis  B surface antigen, hepatitis B core antibody, IgM and hepatitis A antibody  IgM were all negative and hepatitis B antibody was nonreactive.  She was  treated symptomatically.  She was seen again in follow up on 08/14/2002.  Further studies included a negative AMA, negative antismooth muscle  antibody.  Sedimentation rate was 12.  Her IgG was mildly elevated at 1700  mg/dL.  LFTs on 08/15/2002 revealed total bilirubin of 13.5, AST of 1101,  ALT of 1693 and her INR was 1.1.   The patient has continued to feel poorly.  She has had generalized aches and pains.  She has had sharp intermittent  right-sided pain and she also has constant achy feeling in her right upper  quadrant.  She has had poor appetite and nausea with diminished intake and  she has lost a few pounds.  She has not had pruritus.  She has had low-grade  fever.  She came to the emergency room.  She was evaluated by Dr. Sherwood Gambler and  hospitalized for IV fluids and further management.   The patient is on Estratest.  She has been using Phenergan.  She does not  take any other OTC meds or herbal medications.   PAST MEDICAL HISTORY:  No prior history of hepatitis or jaundice.  She has  had C-section x3.  She had a  hysterectomy over a year ago with oophorectomy  and she has been maintained on hormones. She did have a tattoo several years  ago.  There is no history of transfusion or IV drug abuse.   ALLERGIES:  Doxycycline.   FAMILY HISTORY:  Noncontributory.  Both parents are in good health.  Her  paternal grandmother had lupus.   SOCIAL HISTORY:  She is divorced.  She has 3 children.  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  She has been smoking about a pack a  day for years and tried to quit.  She does not drink alcohol.   PHYSICAL EXAMINATION:  GENERAL:  A pleasant, well-developed, mildly obese,  Caucasian female who is deeply jaundiced.  She does not appear to be in  distress.  VITAL SIGNS:  She weighs 160 pounds.  She is 5 feet tall.  Pulse 96/minute,  blood pressure 108/56, respirations 20; and she is afebrile.  HEENT:  Conjunctivae are pale.  Sclerae are not icteric.  Oropharyngeal  mucosa is normal.  NECK:  Without masses or thyromegaly.  CARDIAC:  Exam with regular rhythm.  Normal S1, S2, no murmur or gallop  noted.  LUNGS:  Clear to auscultation.  ABDOMEN:  Abdomen is full.  It is soft.  She has hepatomegaly.  Liver edge  is 5 cm below the right costal  margin and on  inspiration it is soft, but  tender.  Spleen is not palpable.  She does not have dullness in the flanks.  EXTREMITIES: She also does not have peripheral edema, skin rash, or swelling  of her ankles, knees, and small joints of her hands. She is awake and alert  and does not have asterixis.   LABORATORY DATA:  Labs from today:  WBC is 6.7, H&H is 13.4 and 38.3.  Platelet count is 252.  INR is 1.1.  Total bilirubin is 20.8; direct is  11.9; AP is 227, AST 1547; ALT 1461; total protein 6.9 with albumin of 3.1.   ASSESSMENT:  The patient is a 40 year old Caucasian female who has ________  hepatitis which is progressive.  She has marked elevation of her  transaminases and her bilirubin has gone up from about 10, about 10 days  ago, to over 20 now.  She has constitutional symptoms in the way of malaise,  anorexia, nausea, as well as low grade fever.  She does not have skin rash  or arthralgias.  Viral markers for A, B, and C are negative.  Conceivably  she could have acute hepatitis C with negative HCV antibody, but she really  does not have any risk factors for hepatitis C.  Therefore will hold off  doing HCV iron for now.  Her sedimentation rate, ANA, and smooth muscle  antibody also are negative which typically would be seen in autoimmune  hepatitis Type 1.  Family history is negative other than SLE in paternal  grandmother.   The patient's illness is very suspicious for autoimmune hepatitis.  She  should have liver biopsy prior to initiating steroid therapy.  It is  unlikely that we are dealing with acute hepatitis secondary to Wilson  disease.  Drug hepatotoxicity is also unlikely because she is not on many  medications.   RECOMMENDATIONS:  I have recommended percutaneous liver biopsy.  I have  reviewed the procedure and risks with the patient as well as the alternatives. I feel that percutaneous biopsy would be very appropriate.  However, I would like to  repeat her ultrasound to make  sure that there are  no new findings or ascites.  We will also do anti-liver-kidney microsomal  antibody 1, anti-SLA, and anti-LC1 (soluble liver antigen and liver  Cytosol).  I would also freeze some serum in case further studies are  needed, such as for ceruloplasmin, HCVR, and anti-PCR, alpha 1 antitrypsin,  etc.   We appreciate the opportunity to participate in the care of this nice lady.                                               Lionel December, M.D.    NR/MEDQ  D:  08/21/2002  T:  08/22/2002  Job:  [id number]

## 2010-11-25 NOTE — Group Therapy Note (Signed)
   NAME:  Lindsay Walsh, RUBLE                        ACCOUNT NO.:  [id number]   MEDICAL RECORD NO.:  [id number]                   PATIENT TYPE:  INP   LOCATION:  A303                                 FACILITY:  [facility]   PHYSICIAN:  Edward [name], M.D.             DATE OF BIRTH:  [birth year]   DATE OF PROCEDURE:  08/22/2002  DATE OF DISCHARGE:                                   PROGRESS NOTE   PROBLEM:  Hepatitis.   SUBJECTIVE:  The patient has had her ultrasound of the liver today.  She  does not have any ascites.  I have discussed her situation with Dr. Karilyn Cota,  who plans to do liver biopsy and then start her on steroids.  Otherwise, she  is about the same.  She is still nervous about her procedure but otherwise  doing okay.  There are no new problems noted.   PHYSICAL EXAMINATION:  GENERAL:  Exam shows that she is still deeply  jaundiced.  CHEST:  Clear.  ABDOMEN:  She is minimally tender, if any, in the right upper quadrant.   ASSESSMENT:  She is a little better.   PLAN:  Plan is to continue treatments and follow.                                               Edward [name], M.D.    ELH/MEDQ  D:  08/22/2002  T:  08/22/2002  Job:  [id number]

## 2010-11-25 NOTE — Assessment & Plan Note (Signed)
[facility]                       [place] CARDIOLOGY OFFICE NOTE   [place], [place]                     MRN:          [id number]  DATE:10/09/2006                            DOB:          October 13, 1970    Lindsay Walsh returns today for followup.  She continues to be very lethargic.  She continues to complain of dyspnea and dizziness.  She had an  abundance of records to go over.  She had probably 40 pages of Card[employer]  monitoring, which primarily showed sinus rhythm with an occasional PVC.  Her echocardiogram was normal as was her stress test.   The patient unfortunately is on way too many pain medicines.  She  apparently is seen by Beaulah Corin, an anesthesiologist at [facility].  He is apparently out of the [place] for a few months, and Lindsay Walsh  is seeing her.   I explained to the patient that I thought some of her dizziness and  lethargy are certainly related to her medications.  She apparently has  told the people at [facility] that she is way too lethargic, and their  response was that if you are asleep you are not having pain.   CURRENT MEDICATIONS:  1. Azor 10/20.  2. AcipHex 20 a day.  3. Imuran 50 a day.  4. Potassium 10 a day.  5. Lexapro 10 t.i.d.  6. Norflex 100 b.i.d.  7. Lasix 40 a day.  8. Keppra 500 mg at night.  9. Bentyl 10 mg p.r.n.  10.Lyrica 50 t.i.d.  11.Estradiol.   PHYSICAL EXAMINATION:  VITAL SIGNS:  Her blood pressure is 110/70, pulse  is 90 and regular, she is not postural.  HEENT:  Normal.  NECK:  There is no thyromegaly, no lymphadenopathy.  LUNGS:  Clear.  HEART:  There is an S1 and S2 with normal heart sounds.  ABDOMEN:  Benign.  EXTREMITIES:  Intact pulses, no edema.   IMPRESSION:  Multiple somatic complaints not related to her heart,  normal echocardiogram, normal Myoview, and a cardiac event monitor  showing only occasional premature ventricular contractions with no  evidence of atrial fibrillation.  The  patient will continue her Azor for  her blood pressure as this has kept it well controlled.  She is on  multiple drugs for her fibromyalgia, and these clearly need to be  changed.  I do not think anyone should be on Imuran, Lexapro, Norflex,  Keppra, Bentyl, and Lyrica particularly at the dosages that she is on.   She will follow up with Lindsay Walsh at [facility] and Lindsay Walsh  in regards to simplifying her pain management.   We will see her back on a p.r.n. basis.     Lindsay Walsh. [name], MD, [facility]  Electronically Signed    PCN/MedQ  DD: 10/09/2006  DT: 10/09/2006  Job #: [id number]   cc:   Lindsay Walsh, M.D.

## 2010-11-25 NOTE — H&P (Signed)
NAME:  Lindsay Walsh, Lindsay Walsh                        ACCOUNT NO.:  [id number]   MEDICAL RECORD NO.:  [id number]                   PATIENT TYPE:  INP   LOCATION:  A303                                 FACILITY:  [facility]   PHYSICIAN:  Sarita Bottom, M.D.                  DATE OF BIRTH:  17-Nov-1970   DATE OF ADMISSION:  08/21/2002  DATE OF DISCHARGE:                                HISTORY & PHYSICAL   PRIMARY CARE PHYSICIAN:  Oneal Deputy. Juanetta Gosling, M.D.   CHIEF COMPLAINT:  My eyes are yellow.   HISTORY OF PRESENT ILLNESS:  The patient is a 40 year old lady with a  history of hysterectomy.  She was apparently well until about a month ago  when she developed malaise and weakness.  She later noticed that her skin  was yellow and also her eyes were yellow.  She saw her primary M.D., who did  some blood tests, according to the patient.  He did check for viral  hepatitis, which according to her was negative.  She was subsequently  referred to see a GI consultant, who also did some tests and requested her  to come for admission for further evaluation.   REVIEW OF SYSTEMS:  She denies any fever or weight loss.  She admits to  nausea, but denies any vomiting.  She denies any melanotic stools or  hematemesis.  She admits to clay-colored stools.  All other systems were  reviewed and were negative.   PAST MEDICAL HISTORY:  She had a hysterectomy about two years ago for  ovarian cysts.  She denied any blood transfusion at that time.   CURRENT MEDICATIONS:  She takes oral contraceptive pills for which she has  been taking for about two years now.   ALLERGIES:  She is allergic to DOXYCYCLINE and CONTRAST DYES.   FAMILY HISTORY:  Her grandmother had jaundice.  She also had lupus.  Her  mother is alive and well.   SOCIAL HISTORY:  She is divorced.  She has three children.  She smokes about  one packs of cigarettes per day.  She drinks alcohol occasionally.  She  denies any IV or street drugs.  She has no  regular sexual partner, but she  says that she uses protection any time she has sex.   PHYSICAL EXAMINATION:  VITAL SIGNS:  The BP is 120/78, heart rate 108, and  temperature 98.7 degrees.  GENERAL APPEARANCE:  She is a young lady lying comfortably in bed and not in  any apparent distress.  HEENT:  She is deeply jaundiced.  She is not pale.  Her oral mucosa is  moist.  NECK:  Supple.  There is no thyromegaly.  CHEST:  Air entry is good bilaterally.  Breath sounds are fascicular.  No  crackles or wheezes were heard.  CARDIOVASCULAR:  Heart sounds 1 and 2 are normal.  Rhythm is regular.  No  murmurs were heard.  ABDOMEN:  Soft.  The liver is not palpable.  The spleen is not palpable.  No  masses were felt.  She has slight tenderness in the right upper quadrant.  CENTRAL NERVOUS SYSTEM:  Alert and oriented x 3.  EXTREMITIES:  She has no pedal edema.   LABORATORY DATA:  Ultrasound done on August 11, 2002, showed gallbladder  wall thickening.  She did not have any pericholecystic fluid collection.  Her liver appeared normal on the ultrasound.  Her total bilirubin now is  18.9, direct bilirubin 12.0, indirect bilirubin 2.2, SGOT 1540, SGPT 1465,  alkaline phosphatase 218, total protein 7.1, and albumin 3.1.  Her PTT is  38, PT 14.5, and INR 1.1.  The lipase is 38 and the amylase is 55.  The  sodium is 133, potassium 2.5, chloride 112, CO2 25, glucose 130, BUN 2,  creatinine 1.0, and calcium 8.6.  The WBC is 6.7, hemoglobin 13.4,  hematocrit 38.5, MCV 88.5, and platelet count 252.   ASSESSMENT AND PLAN:  Jaundice, probably cholecystic in nature with a  differential of hepatocellular disease.  Will also rule out viral hepatitis  because there is no viral hepatitis profile on the results available to me  at this time.  The patient will be admitted under the care of Edward L.  Juanetta Gosling, M.D.  We will send for a hepatitis profile.  We also called a GI  consult for a possible ERCP and further  evaluation.  Further work-up and  management will depend on the clinical course.                                               Sarita Bottom, M.D.    DW/MEDQ  D:  08/21/2002  T:  08/21/2002  Job:  [id number]

## 2010-11-25 NOTE — Procedures (Signed)
[facility]  Patient:    Lindsay Walsh, Lindsay Walsh Visit Number: [id number] MRN: [id number]          Service Type: EMS Location: ED Attending Physician:  Hilario Quarry Dictated by:   Kari Baars, M.D. Proc. Date: 01/21/02 Admit Date:  01/21/2002 Discharge Date: 01/21/2002                            EKG Interpretations  TIME/DATE:  1755, January 21, 2002.  DESCRIPTION:  The rhythm is sinus rhythm with a rate in the 90s.  Otherwise, normal electrocardiogram. Dictated by:   Kari Baars, M.D. Attending Physician:  Hilario Quarry DD:  01/22/02 TD:  01/27/02 Job: [id number]

## 2010-11-25 NOTE — Discharge Summary (Signed)
   NAME:  Lindsay Walsh, Lindsay Walsh                        ACCOUNT NO.:  [id number]   MEDICAL RECORD NO.:  [id number]                   PATIENT TYPE:  INP   LOCATION:  A303                                 FACILITY:  [facility]   PHYSICIAN:  Edward [name], M.D.             DATE OF BIRTH:  [birth year]   DATE OF ADMISSION:  08/21/2002  DATE OF DISCHARGE:  08/26/2002                                 DISCHARGE SUMMARY   FINAL DISCHARGE DIAGNOSES:  1. Autoimmune hepatitis.  2. Hypokalemia.   HISTORY OF PRESENT ILLNESS:  This is a 40 year old who developed a sensation  of being nauseated and just not feeling well.  She came to my office,  underwent an ultrasound of the gallbladder, and eventually was referred to  Dr. Geni Bers and Dr. Karilyn Cota, at which point she was found to be quite jaundiced.  She was felt to have some form of hepatitis, had viral hepatitis markers  which were negative for hepatitis A, B, and C and was felt to have probably  an autoimmune hepatitis.  She presented to the emergency room on the day of  admission with marked increase in her symptoms and was markedly jaundiced  and generally felt bad.  She was treated with fluids in the emergency room  but clearly was not able to be discharged at that point.   PHYSICAL EXAMINATION:  GENERAL:  Exam showed that she was markedly  jaundiced.  VITAL SIGNS:  Her blood pressure 120/80, pulse was 80, she was afebrile.   LABORATORY DATA:  Her potassium was 3.2.  Her AST was very elevated.  LDH  and O2 were also very elevated.  Her amylase and lipase were normal.   HOSPITAL COURSE:  She underwent a liver biopsy by Dr. Karilyn Cota and was  immediately started on prednisone.  She showed marked improvement after  starting prednisone and by the time of discharge her bilirubin was down to  5.7 on the day prior to discharge, down from approximately 20.   DISCHARGE MEDICATIONS:  1. Prednisone 30 mg daily.  2. Oxycodone 5 mg b.i.d. p.r.n. pain.   FOLLOW UP:   To follow up in my office in about a week for lab work and  follow up with Dr. Karilyn Cota in about a week as well.                                               Edward [name], M.D.    ELH/MEDQ  D:  08/26/2002  T:  08/26/2002  Job:  [id number]

## 2010-11-25 NOTE — Op Note (Signed)
Lindsay Walsh, Lindsay Walsh              ACCOUNT NO.:  [id number]   MEDICAL RECORD NO.:  [id number]          PATIENT TYPE:  AMB   LOCATION:  DAY                           FACILITY:  [facility]   PHYSICIAN:  Jerolyn Shin C. [name], M.D.   DATE OF BIRTH:  1971/04/16   DATE OF PROCEDURE:  12/05/2005  DATE OF DISCHARGE:                                 OPERATIVE REPORT   PREOPERATIVE DIAGNOSIS:  Left breast masses.   POSTOPERATIVE DIAGNOSIS:  Left breast masses.   PROCEDURE:  Left partial mastectomy.   SURGEON:  Dirk Dress. [name], M.D.   DESCRIPTION OF PROCEDURE:  Under general anesthesia the left breast and  chest were prepped and draped in a sterile field.  There was a mass at the 3  o'clock position and another mass at the 6 o'clock position of the breast.  An elliptical incision was made encompassing the mass in the 3 o'clock  position.  The incision was extended through the subcutaneous tissue.  The  mass seemed to be limited to the subcutaneous tissue and some of the skin.  Breast tissue was excised along with the mass.  It was sent for pathologic  evaluation.  The deep tissue was closed with 3-0 chromic.  Subcutaneous  tissue was also closed with 3-0 chromic.   Another elliptical incision was made around the mass in the inferior aspect  of the breast at about the 6 o'clock position.  It was near the inframammary  fold.  Incision extended down through the subcutaneous tissue.  It was  excised with electrocautery.  The tissue and subcutaneous tissue were closed  with 3-0 chromic.  Dermabond was placed over the incisions and allowed to  dry.  The patient tolerated the procedure well.  She was awakened from  anesthesia, transferred to a bed, and taken to the postanesthetic care unit  in satisfactory condition.      Dirk Dress. [name], M.D.  Electronically Signed     LCS/MEDQ  D:  12/05/2005  T:  12/05/2005  Job:  [id number]   cc:   Ramon Dredge [name], M.D.  Fax: [phone]

## 2010-11-28 ENCOUNTER — Encounter (HOSPITAL_COMMUNITY): Payer: Medicare Other | Admitting: Psychiatry

## 2010-12-02 ENCOUNTER — Encounter (INDEPENDENT_AMBULATORY_CARE_PROVIDER_SITE_OTHER): Payer: Medicare Other | Admitting: Psychiatry

## 2010-12-02 DIAGNOSIS — F331 Major depressive disorder, recurrent, moderate: Secondary | ICD-10-CM

## 2010-12-06 ENCOUNTER — Encounter (HOSPITAL_COMMUNITY): Payer: Medicare Other | Admitting: Psychiatry

## 2010-12-08 ENCOUNTER — Encounter (HOSPITAL_COMMUNITY): Payer: Medicare Other | Admitting: Psychiatry

## 2010-12-22 ENCOUNTER — Encounter (INDEPENDENT_AMBULATORY_CARE_PROVIDER_SITE_OTHER): Payer: Medicare Other | Admitting: Psychiatry

## 2010-12-22 DIAGNOSIS — F331 Major depressive disorder, recurrent, moderate: Secondary | ICD-10-CM

## 2010-12-26 ENCOUNTER — Ambulatory Visit (INDEPENDENT_AMBULATORY_CARE_PROVIDER_SITE_OTHER): Payer: Medicare Other | Admitting: Internal Medicine

## 2010-12-30 ENCOUNTER — Encounter (INDEPENDENT_AMBULATORY_CARE_PROVIDER_SITE_OTHER): Payer: Medicare Other | Admitting: Psychiatry

## 2010-12-30 DIAGNOSIS — F331 Major depressive disorder, recurrent, moderate: Secondary | ICD-10-CM

## 2011-01-04 ENCOUNTER — Encounter (INDEPENDENT_AMBULATORY_CARE_PROVIDER_SITE_OTHER): Payer: Medicare Other | Admitting: Psychiatry

## 2011-01-04 DIAGNOSIS — F331 Major depressive disorder, recurrent, moderate: Secondary | ICD-10-CM

## 2011-01-14 ENCOUNTER — Other Ambulatory Visit: Payer: Self-pay | Admitting: Cardiology

## 2011-01-19 ENCOUNTER — Encounter (HOSPITAL_COMMUNITY): Payer: Medicare Other | Admitting: Psychiatry

## 2011-01-27 ENCOUNTER — Encounter (INDEPENDENT_AMBULATORY_CARE_PROVIDER_SITE_OTHER): Payer: Medicare Other | Admitting: Psychiatry

## 2011-01-27 DIAGNOSIS — F331 Major depressive disorder, recurrent, moderate: Secondary | ICD-10-CM

## 2011-02-02 ENCOUNTER — Encounter (HOSPITAL_COMMUNITY): Payer: Medicare Other | Admitting: Psychiatry

## 2011-02-07 ENCOUNTER — Encounter (INDEPENDENT_AMBULATORY_CARE_PROVIDER_SITE_OTHER): Payer: Medicare Other | Admitting: Psychiatry

## 2011-02-07 DIAGNOSIS — F331 Major depressive disorder, recurrent, moderate: Secondary | ICD-10-CM

## 2011-02-13 ENCOUNTER — Encounter (HOSPITAL_COMMUNITY): Payer: Medicare Other | Admitting: Psychiatry

## 2011-02-16 ENCOUNTER — Encounter (INDEPENDENT_AMBULATORY_CARE_PROVIDER_SITE_OTHER): Payer: Self-pay

## 2011-03-01 ENCOUNTER — Other Ambulatory Visit: Payer: Self-pay | Admitting: Neurology

## 2011-03-01 DIAGNOSIS — R200 Anesthesia of skin: Secondary | ICD-10-CM

## 2011-03-02 ENCOUNTER — Encounter (INDEPENDENT_AMBULATORY_CARE_PROVIDER_SITE_OTHER): Payer: Medicare Other | Admitting: Psychiatry

## 2011-03-02 DIAGNOSIS — F331 Major depressive disorder, recurrent, moderate: Secondary | ICD-10-CM

## 2011-03-03 ENCOUNTER — Ambulatory Visit (HOSPITAL_COMMUNITY)
Admission: RE | Admit: 2011-03-03 | Discharge: 2011-03-03 | Disposition: A | Discharge status: Home / Self Care | Payer: Medicare Other | Source: Ambulatory Visit | Attending: Neurology | Admitting: Neurology

## 2011-03-03 DIAGNOSIS — R200 Anesthesia of skin: Secondary | ICD-10-CM

## 2011-03-03 DIAGNOSIS — M545 Low back pain, unspecified: Secondary | ICD-10-CM | POA: Insufficient documentation

## 2011-03-03 DIAGNOSIS — M542 Cervicalgia: Secondary | ICD-10-CM | POA: Insufficient documentation

## 2011-03-03 DIAGNOSIS — R209 Unspecified disturbances of skin sensation: Secondary | ICD-10-CM | POA: Insufficient documentation

## 2011-03-09 ENCOUNTER — Encounter (INDEPENDENT_AMBULATORY_CARE_PROVIDER_SITE_OTHER): Payer: Medicare Other | Admitting: Psychiatry

## 2011-03-09 DIAGNOSIS — F331 Major depressive disorder, recurrent, moderate: Secondary | ICD-10-CM

## 2011-03-20 ENCOUNTER — Encounter (INDEPENDENT_AMBULATORY_CARE_PROVIDER_SITE_OTHER): Payer: Medicare Other | Admitting: Psychiatry

## 2011-03-20 DIAGNOSIS — F331 Major depressive disorder, recurrent, moderate: Secondary | ICD-10-CM

## 2011-03-27 ENCOUNTER — Encounter (INDEPENDENT_AMBULATORY_CARE_PROVIDER_SITE_OTHER): Payer: Medicare Other | Admitting: Psychiatry

## 2011-03-27 DIAGNOSIS — F331 Major depressive disorder, recurrent, moderate: Secondary | ICD-10-CM

## 2011-03-28 ENCOUNTER — Ambulatory Visit (INDEPENDENT_AMBULATORY_CARE_PROVIDER_SITE_OTHER): Payer: Medicare Other | Admitting: Internal Medicine

## 2011-03-28 ENCOUNTER — Encounter (INDEPENDENT_AMBULATORY_CARE_PROVIDER_SITE_OTHER): Payer: Self-pay | Admitting: Internal Medicine

## 2011-03-28 DIAGNOSIS — K3184 Gastroparesis: Secondary | ICD-10-CM

## 2011-03-28 DIAGNOSIS — R140 Abdominal distension (gaseous): Secondary | ICD-10-CM

## 2011-03-28 DIAGNOSIS — R141 Gas pain: Secondary | ICD-10-CM

## 2011-03-28 DIAGNOSIS — R112 Nausea with vomiting, unspecified: Secondary | ICD-10-CM

## 2011-03-28 NOTE — Progress Notes (Signed)
Presenting complaint; nausea vomiting and bloating Subjective; patient is a 40 year old Caucasian female with multiple medical problems including autoimmune hepatitis chronic GERD irritable bowel syndrome as well as fibromyalgia neuropathy chronic back pain who presents for scheduled visit. She was last seen in February this year for epigastric and right upper quadrant pain. Her hepatologist Dr. [name] of [facility] device esophagogastroduodenoscopy which was performed in March 2012. She had erosive gastritis but no evidence of peptic ulcer disease and she had food debris in the stomach suggestive of gastroparesis. She was given instructions to be on gastroparesis he is diet. She stayed on this diet for a while but she states she is a limited income and she cannot buy the foods that are recommended in the diet. She is back on her usual diet.  She now presents with frequent nausea and intermittent vomiting. She's had 3 episodes of vomiting this month. She also complains of extreme bloating after she eats. She is having epigastric pain but then she says she has pain all over her body. He sees pain expert at [facility] she says methadone is not working. She does not have a good appetite. On Sunday she may have single-needle. However she has not lost any weight. She denies hematemesis melena or rectal bleeding. She also denies fever or chills. She still has right upper quadrant pain but not like she used to have prior to her cholecystectomy.  Current medications; Current Outpatient Prescriptions on File Prior to Visit  Medication Sig Dispense Refill  . amLODipine-benazepril (LOTREL) 10-20 MG per capsule Take 1 capsule by mouth daily.        . Ascorbic Acid (VITAMIN C) 100 MG tablet Take by mouth daily.        Marland Kitchen azaTHIOprine (IMURAN) 50 MG tablet Take 50 mg by mouth daily.        . calcium-vitamin D (OS-CAL 500 + D) 500-200 MG-UNIT per tablet Take 1 tablet by mouth 2 (two) times daily.       . clonazePAM  (KLONOPIN) 0.5 MG tablet Take 0.5 mg by mouth as needed.       . Cyanocobalamin (B-12) 100 MCG TABS Take by mouth.        . dicyclomine (BENTYL) 10 MG capsule Take 10 mg by mouth as needed.       . DULoxetine (CYMBALTA) 30 MG capsule Take 30 mg by mouth daily.        Marland Kitchen esomeprazole (NEXIUM) 40 MG capsule Take 40 mg by mouth daily before breakfast.        . estradiol (CLIMARA - DOSED IN MG/24 HR) 0.075 mg/24hr Place 1 patch onto the skin once a week.        . fexofenadine (ALLEGRA) 60 MG tablet Take 60 mg by mouth daily.        . furosemide (LASIX) 40 MG tablet Take 40 mg by mouth 2 (two) times daily.        . methadone (DOLOPHINE) 10 MG/5ML solution Take by mouth every 6 (six) hours as needed.        . multivitamin-iron-minerals-folic acid (CENTRUM) chewable tablet Chew 1 tablet by mouth daily.        Marland Kitchen NITROSTAT 0.4 MG SL tablet place 1 tablet under the tongue if needed every 5 minutes for chest pain for 3 doses IF NO RELIEF AFTER 3RD DOSE CALL PRESCRIBER OR 911.  25 tablet  3  . pramipexole (MIRAPEX) 0.25 MG tablet Take 0.25 mg by mouth as needed.       Marland Kitchen  zolpidem (AMBIEN CR) 12.5 MG CR tablet Take by mouth at bedtime as needed. As Needed       Objective; BP 130/78  Pulse 76  Temp(Src) 98.2 F (36.8 C) (Oral)  Resp 16  Ht 5' (1.524 m)  Wt 188 lb (85.276 kg)  BMI 36.72 kg/m2 Her weight has been stable. Conjunctiva is pink. Sclerae nonicteric. Oropharyngeal mucosa is normal no neck masses or thyromegaly noted. Cardiac exam with regular rhythm normal S1 and S2. No murmur or gallop noted. Lungs are clear to auscultation Abdomen is full. Bowel sounds are normal. On palpation soft abdomen without tenderness  organomegaly or masses. No peripheral edema noted. Lab data from Dr. Juanetta Gosling office from 03/22/2011 bilirubin 0.3 AP 103, AST 20, ALT 17, total protein 7.4, albumin 4.3, vitamin D level 53. Assessment #1. Nausea, vomiting and bloating. These symptoms are suggestive of gastroparesis.  This could be do to her narcotic as well as other medications. If she has mild gastroparesis dietary measures may help however if she has severe gastroparesis she may need a promotility agent. #2. History of autoimmune hepatitis. She's been in remission for several years. #3. Chronic GERD. #4. IBS doing well with therapy. Plan Solid phase gastric emptying study

## 2011-03-28 NOTE — Patient Instructions (Signed)
Gastric Emptying Study to be scheduled. Take Advil on as needed basis.

## 2011-03-30 ENCOUNTER — Encounter (HOSPITAL_COMMUNITY): Payer: Medicare Other | Admitting: Psychiatry

## 2011-03-30 ENCOUNTER — Encounter (INDEPENDENT_AMBULATORY_CARE_PROVIDER_SITE_OTHER): Payer: Medicare Other | Admitting: Psychiatry

## 2011-03-30 ENCOUNTER — Encounter (HOSPITAL_COMMUNITY)
Admission: RE | Admit: 2011-03-30 | Discharge: 2011-03-30 | Disposition: A | Discharge status: Home / Self Care | Payer: Medicare Other | Source: Ambulatory Visit | Attending: Internal Medicine | Admitting: Internal Medicine

## 2011-03-30 ENCOUNTER — Encounter (HOSPITAL_COMMUNITY): Payer: Self-pay

## 2011-03-30 DIAGNOSIS — F331 Major depressive disorder, recurrent, moderate: Secondary | ICD-10-CM

## 2011-03-30 DIAGNOSIS — R140 Abdominal distension (gaseous): Secondary | ICD-10-CM

## 2011-03-30 DIAGNOSIS — R112 Nausea with vomiting, unspecified: Secondary | ICD-10-CM | POA: Insufficient documentation

## 2011-03-30 HISTORY — DX: Essential (primary) hypertension: I10

## 2011-03-30 MED ORDER — TECHNETIUM TC 99M SULFUR COLLOID
2.0000 | Freq: Once | INTRAVENOUS | Status: AC | PRN
  Administered 2011-03-30: 2 via ORAL

## 2011-04-03 ENCOUNTER — Other Ambulatory Visit (HOSPITAL_COMMUNITY): Payer: Self-pay | Admitting: Pulmonary Disease

## 2011-04-03 DIAGNOSIS — Z139 Encounter for screening, unspecified: Secondary | ICD-10-CM

## 2011-04-05 ENCOUNTER — Encounter (INDEPENDENT_AMBULATORY_CARE_PROVIDER_SITE_OTHER): Payer: Medicare Other | Admitting: Psychiatry

## 2011-04-05 DIAGNOSIS — F331 Major depressive disorder, recurrent, moderate: Secondary | ICD-10-CM

## 2011-04-06 ENCOUNTER — Encounter (HOSPITAL_COMMUNITY): Payer: Medicare Other | Admitting: Psychiatry

## 2011-04-12 ENCOUNTER — Other Ambulatory Visit (INDEPENDENT_AMBULATORY_CARE_PROVIDER_SITE_OTHER): Payer: Self-pay | Admitting: Internal Medicine

## 2011-04-12 ENCOUNTER — Encounter (INDEPENDENT_AMBULATORY_CARE_PROVIDER_SITE_OTHER): Payer: Medicare Other | Admitting: Psychiatry

## 2011-04-12 DIAGNOSIS — K754 Autoimmune hepatitis: Secondary | ICD-10-CM

## 2011-04-12 DIAGNOSIS — F331 Major depressive disorder, recurrent, moderate: Secondary | ICD-10-CM

## 2011-04-19 ENCOUNTER — Encounter (HOSPITAL_COMMUNITY): Payer: Medicare Other | Admitting: Psychiatry

## 2011-04-20 ENCOUNTER — Other Ambulatory Visit: Payer: Self-pay | Admitting: Neurology

## 2011-04-20 DIAGNOSIS — R200 Anesthesia of skin: Secondary | ICD-10-CM

## 2011-04-24 ENCOUNTER — Ambulatory Visit (HOSPITAL_COMMUNITY)
Admission: RE | Admit: 2011-04-24 | Discharge: 2011-04-24 | Disposition: A | Discharge status: Home / Self Care | Payer: Medicare Other | Source: Ambulatory Visit | Attending: Pulmonary Disease | Admitting: Pulmonary Disease

## 2011-04-24 DIAGNOSIS — Z139 Encounter for screening, unspecified: Secondary | ICD-10-CM

## 2011-04-24 DIAGNOSIS — Z1231 Encounter for screening mammogram for malignant neoplasm of breast: Secondary | ICD-10-CM | POA: Insufficient documentation

## 2011-04-25 ENCOUNTER — Ambulatory Visit (HOSPITAL_COMMUNITY)
Admission: RE | Admit: 2011-04-25 | Discharge: 2011-04-25 | Disposition: A | Discharge status: Home / Self Care | Payer: Medicare Other | Source: Ambulatory Visit | Attending: Neurology | Admitting: Neurology

## 2011-04-25 DIAGNOSIS — R209 Unspecified disturbances of skin sensation: Secondary | ICD-10-CM | POA: Insufficient documentation

## 2011-04-25 DIAGNOSIS — J329 Chronic sinusitis, unspecified: Secondary | ICD-10-CM | POA: Insufficient documentation

## 2011-04-25 DIAGNOSIS — R200 Anesthesia of skin: Secondary | ICD-10-CM

## 2011-05-01 ENCOUNTER — Other Ambulatory Visit: Payer: Self-pay | Admitting: Pulmonary Disease

## 2011-05-01 DIAGNOSIS — R928 Other abnormal and inconclusive findings on diagnostic imaging of breast: Secondary | ICD-10-CM

## 2011-05-04 ENCOUNTER — Other Ambulatory Visit: Payer: Self-pay | Admitting: Pulmonary Disease

## 2011-05-04 ENCOUNTER — Encounter (INDEPENDENT_AMBULATORY_CARE_PROVIDER_SITE_OTHER): Payer: Medicare Other | Admitting: Psychiatry

## 2011-05-04 DIAGNOSIS — R928 Other abnormal and inconclusive findings on diagnostic imaging of breast: Secondary | ICD-10-CM

## 2011-05-04 DIAGNOSIS — F331 Major depressive disorder, recurrent, moderate: Secondary | ICD-10-CM

## 2011-05-11 ENCOUNTER — Encounter (HOSPITAL_COMMUNITY): Payer: Medicare Other | Admitting: Psychiatry

## 2011-05-11 ENCOUNTER — Ambulatory Visit
Admission: RE | Admit: 2011-05-11 | Discharge: 2011-05-11 | Disposition: A | Discharge status: Home / Self Care | Payer: Medicare Other | Source: Ambulatory Visit | Attending: Pulmonary Disease | Admitting: Pulmonary Disease

## 2011-05-11 ENCOUNTER — Other Ambulatory Visit: Payer: Self-pay | Admitting: Pulmonary Disease

## 2011-05-11 ENCOUNTER — Ambulatory Visit
Admission: RE | Admit: 2011-05-11 | Discharge: 2011-05-11 | Disposition: A | Discharge status: Home / Self Care | Payer: PRIVATE HEALTH INSURANCE | Source: Ambulatory Visit | Attending: Pulmonary Disease | Admitting: Pulmonary Disease

## 2011-05-11 DIAGNOSIS — R928 Other abnormal and inconclusive findings on diagnostic imaging of breast: Secondary | ICD-10-CM

## 2011-05-25 ENCOUNTER — Encounter (HOSPITAL_COMMUNITY): Payer: Self-pay | Admitting: Psychiatry

## 2011-05-25 ENCOUNTER — Ambulatory Visit (INDEPENDENT_AMBULATORY_CARE_PROVIDER_SITE_OTHER): Payer: Medicare Other | Admitting: Psychiatry

## 2011-05-25 ENCOUNTER — Encounter (HOSPITAL_COMMUNITY): Payer: Medicare Other | Admitting: Psychiatry

## 2011-05-25 VITALS — Wt 185.0 lb

## 2011-05-25 DIAGNOSIS — F331 Major depressive disorder, recurrent, moderate: Secondary | ICD-10-CM

## 2011-05-25 MED ORDER — AMPHETAMINE-DEXTROAMPHETAMINE 10 MG PO TABS: 10.0000 mg | ORAL_TABLET | Freq: Every day | ORAL | Status: DC

## 2011-05-25 NOTE — Progress Notes (Signed)
Patient came today for her followup appointment she is taking Adderall 10 mg daily. She has loss 3 pounds from her previous visit. She denies any more seizure like activity. She believes her depression has been much better since she is starting Adderall. She has good energy and able to do things however she continued to concern about her other physical conditions. Patient has multiple have issue and has been seeing Dr. Ollen Bowl and recently Dr. Harrel Carina for seizure like activities. She's been taking Keppra and tolerating the medication very well. She denies any crying spells social isolation anhedonia or depressed mood. She denies any side effects of medication including heart palpitation insomnia or anxiety. She does not require Ambien and sleeping good. She take Cymbalta which has been prescribed by her primary care physician.  Mental status examination patient is casually dressed and fairly groomed. Her speech is slow but soft and clear. Her thought process is logical linear and goal-directed. There no psychotic symptoms present. She denies any active or passive suicidal thinking or homicidal thinking. Her attention and concentration is fair. She described her mood is tired and her affect is mood congruent. She is alert and oriented x3. Her insight judgment and impulse control is okay.  Assessment depressive disorder NOS rule out Major depressive disorder  Plan. I will continue Adderall 10 mg daily. She is taking Cymbalta 30 mg twice a day from her primary care Dr. She is not taking Klonopin anymore and does not require Ambien. I have explained the risks and benefits of medication in detail I will see her again in 2 months.

## 2011-05-31 ENCOUNTER — Encounter (HOSPITAL_COMMUNITY): Payer: Medicare Other

## 2011-06-07 ENCOUNTER — Ambulatory Visit (HOSPITAL_COMMUNITY): Payer: Medicare Other | Admitting: Psychiatry

## 2011-06-10 ENCOUNTER — Other Ambulatory Visit (HOSPITAL_COMMUNITY): Payer: Self-pay | Admitting: Psychiatry

## 2011-06-16 ENCOUNTER — Telehealth: Payer: Self-pay | Admitting: Cardiology

## 2011-06-16 NOTE — Telephone Encounter (Signed)
PT WAS SEEN AT [facility] FOR A PROCEDURE TODAY AND TOLD THAT SHE HAD A PREMATURE HEARTBEAT, WAS TOLD TO LET CARDIO DOC KNOW.   PT IS SCHEDULED FOR ANOTHER PROCEDURE NEXT WEEK.

## 2011-06-20 ENCOUNTER — Telehealth: Payer: Self-pay | Admitting: *Deleted

## 2011-06-20 ENCOUNTER — Other Ambulatory Visit (HOSPITAL_COMMUNITY): Payer: Self-pay | Admitting: Pulmonary Disease

## 2011-06-20 DIAGNOSIS — R911 Solitary pulmonary nodule: Secondary | ICD-10-CM

## 2011-06-20 NOTE — Telephone Encounter (Signed)
Patient states that she had a procedure at [facility] and was told that she had a "premature heart beat" and to notify our office.  In addition, states she has had to use prn ntg for intermittent chest pain.  Advised her to obtain records from [facility] and we will schedule her for a follow up to see Samara Deist next week, however to seek immediate attention for persistent chest pain.  Verbalized understanding.

## 2011-06-21 ENCOUNTER — Ambulatory Visit (HOSPITAL_COMMUNITY)
Admission: RE | Admit: 2011-06-21 | Discharge: 2011-06-21 | Disposition: A | Discharge status: Home / Self Care | Payer: PRIVATE HEALTH INSURANCE | Source: Ambulatory Visit | Attending: Pulmonary Disease | Admitting: Pulmonary Disease

## 2011-06-21 DIAGNOSIS — R002 Palpitations: Secondary | ICD-10-CM | POA: Insufficient documentation

## 2011-06-21 NOTE — Progress Notes (Signed)
*  PRELIMINARY RESULTS* Echocardiogram 48H Holter monitor has been performed.  Lindsay Walsh 06/21/2011, 11:03 AM

## 2011-06-23 ENCOUNTER — Ambulatory Visit (HOSPITAL_COMMUNITY)
Admission: RE | Admit: 2011-06-23 | Discharge: 2011-06-23 | Disposition: A | Discharge status: Home / Self Care | Payer: PRIVATE HEALTH INSURANCE | Source: Ambulatory Visit | Attending: Pulmonary Disease | Admitting: Pulmonary Disease

## 2011-06-23 DIAGNOSIS — J984 Other disorders of lung: Secondary | ICD-10-CM | POA: Insufficient documentation

## 2011-06-23 DIAGNOSIS — R911 Solitary pulmonary nodule: Secondary | ICD-10-CM

## 2011-06-27 ENCOUNTER — Encounter: Payer: Self-pay | Admitting: Cardiology

## 2011-06-27 ENCOUNTER — Ambulatory Visit (INDEPENDENT_AMBULATORY_CARE_PROVIDER_SITE_OTHER): Payer: PRIVATE HEALTH INSURANCE | Admitting: Cardiology

## 2011-06-27 DIAGNOSIS — R079 Chest pain, unspecified: Secondary | ICD-10-CM

## 2011-06-27 DIAGNOSIS — F1910 Other psychoactive substance abuse, uncomplicated: Secondary | ICD-10-CM

## 2011-06-27 DIAGNOSIS — R7301 Impaired fasting glucose: Secondary | ICD-10-CM | POA: Insufficient documentation

## 2011-06-27 DIAGNOSIS — I10 Essential (primary) hypertension: Secondary | ICD-10-CM

## 2011-06-27 DIAGNOSIS — F329 Major depressive disorder, single episode, unspecified: Secondary | ICD-10-CM | POA: Insufficient documentation

## 2011-06-27 DIAGNOSIS — K219 Gastro-esophageal reflux disease without esophagitis: Secondary | ICD-10-CM | POA: Insufficient documentation

## 2011-06-27 DIAGNOSIS — F17200 Nicotine dependence, unspecified, uncomplicated: Secondary | ICD-10-CM

## 2011-06-27 NOTE — Patient Instructions (Signed)
**Note De-identified  Obfuscation** Your physician recommends that you continue on your current medications as directed. Please refer to the Current Medication list given to you today.  Your physician recommends that you schedule a follow-up appointment in: as needed  

## 2011-06-27 NOTE — Assessment & Plan Note (Signed)
Patient once again reports transient discontinuation of tobacco use with resumption once she experienced stress.  I explained that multiple quit attempts may be necessary and encouraged her to persist in her efforts to discontinue cigarette smoking.  She has a prescription for Chantix given to her by Dr. Juanetta Gosling and agrees to another trial with it.    In the absence of more pressing cardiovascular issues, I will not plan a routine return visit but will be available to assist this nice woman as needed.

## 2011-06-27 NOTE — Assessment & Plan Note (Addendum)
Blood pressure control remains excellent despite a decrease in the intensity of her treatment.  Hypertension must be quite mild if controlled with a moderate dose of ACE inhibitor.  Patient owns a sphygmomanometer and is advised to check her blood pressure now and again.

## 2011-06-27 NOTE — Progress Notes (Signed)
Patient ID: Lindsay Walsh, female   DOB: 03-15-1971, 40 y.o.   MRN: [id number] HPI: Return visit for this very nice young woman with a history of fibromyalgia treated in Pain Clinic at [facility].  She recently underwent a drug infusion there that required 3 hours of observation at which time PVCs were noted prompting referral back to a cardiologist.  Subsequent Holter monitoring revealed relatively infrequent PVCs and PACs with no significant arrhythmias.  Approximately 8 symptomatic spells were reported that included dyspnea and chest discomfort with 2 or 3 also noting palpitations.  Most of the episodes lasted a matter of seconds, and there was no clear association between symptoms and heart rhythm.  Patient has developed no new medical problems since her last office visit and has not required evaluation in the emergency department or hospitalization.  She developed episodes of dizziness prompting a reduction in her medications for hypertension approximately one week ago.  She has experienced no problems since.  Prior to Admission medications   Medication Sig Start Date End Date Taking? Authorizing Provider  amphetamine-dextroamphetamine (ADDERALL, 10MG ,) 10 MG tablet Take 1 tablet (10 mg total) by mouth daily. 05/25/11 05/24/12 Yes Syed T. Arfeen, MD  Ascorbic Acid (VITAMIN C) 100 MG tablet Take by mouth daily.     Yes Historical Provider, MD  azaTHIOprine (IMURAN) 50 MG tablet take 1 tablet by mouth once daily 04/12/11  Yes Malissa Hippo, MD  calcium-vitamin D (OS-CAL 500 + D) 500-200 MG-UNIT per tablet Take 1 tablet by mouth 2 (two) times daily.    Yes Historical Provider, MD  clonazePAM (KLONOPIN) 0.5 MG tablet Take 0.5 mg by mouth as needed.    Yes Historical Provider, MD  Cyanocobalamin (B-12) 100 MCG TABS Take by mouth.     Yes Historical Provider, MD  dicyclomine (BENTYL) 10 MG capsule Take 10 mg by mouth as needed.    Yes Historical Provider, MD  DULoxetine (CYMBALTA) 30 MG capsule Take 30 mg  by mouth daily.     Yes Historical Provider, MD  esomeprazole (NEXIUM) 40 MG capsule Take 40 mg by mouth daily before breakfast.     Yes Historical Provider, MD  estradiol (CLIMARA - DOSED IN MG/24 HR) 0.075 mg/24hr Place 1 patch onto the skin once a week.     Yes Historical Provider, MD  fexofenadine (ALLEGRA) 60 MG tablet Take 60 mg by mouth daily.     Yes Historical Provider, MD  furosemide (LASIX) 40 MG tablet Take 40 mg by mouth daily.    Yes Historical Provider, MD  GARLIC PO Take by mouth daily.     Yes Historical Provider, MD  levETIRAcetam (KEPPRA) 750 MG tablet Take 750 mg by mouth daily.     Yes Historical Provider, MD  Lido-Capsaicin-Men-Methyl Sal (MEDI-PATCH-LIDOCAINE EX) Apply topically as needed.     Yes Historical Provider, MD  lisinopril (PRINIVIL,ZESTRIL) 10 MG tablet Take 10 mg by mouth daily.     Yes Historical Provider, MD  methadone (DOLOPHINE) 10 MG/5ML solution Take by mouth every 6 (six) hours as needed.     Yes Historical Provider, MD  methocarbamol (ROBAXIN) 500 MG tablet Take 500 mg by mouth 3 (three) times daily.     Yes Historical Provider, MD  multivitamin-iron-minerals-folic acid (CENTRUM) chewable tablet Chew 1 tablet by mouth daily.     Yes Historical Provider, MD  NASAL SPRAY SALINE NA Place into the nose as needed.     Yes Historical Provider, MD  NITROSTAT 0.4 MG SL  tablet place 1 tablet under the tongue if needed every 5 minutes for chest pain for 3 doses IF NO RELIEF AFTER 3RD DOSE CALL PRESCRIBER OR 911. 01/14/11  Yes Gerrit Friends. Yanette Tripoli, MD  Nutritional Supplements (COLD AND FLU PO) Take by mouth as needed.     Yes Historical Provider, MD  pramipexole (MIRAPEX) 0.25 MG tablet Take 0.25 mg by mouth as needed.    Yes Historical Provider, MD  Probiotic Product (ALIGN PO) Take by mouth daily.     Yes Historical Provider, MD  zolpidem (AMBIEN) 10 MG tablet Take 10 mg by mouth at bedtime as needed.     Yes Historical Provider, MD    Allergies  Allergen Reactions    . Doxycycline     REACTION: rash  . Fentanyl   . Iohexol   . Ivp Dye (Iodinated Diagnostic Agents)       Past medical history, social history, and family history reviewed and updated.  ROS: Intermittent chest discomfort continues, but has not been severe enough to prompt medical evaluation.  She denies orthopnea, PND, syncope and pedal edema.  PHYSICAL EXAM: BP 126/85  Pulse 83  Resp 18  Ht 5' (1.524 m)  Wt 83.915 kg (185 lb)  BMI 36.13 kg/m2   General-Well developed; no acute distress Body habitus-moderately overweight Neck-No JVD; no carotid bruits Lungs-clear lung fields; resonant to percussion Cardiovascular-normal PMI; normal S1 and S2S4 present Abdomen-normal bowel sounds; soft and non-tender without masses or organomegaly Musculoskeletal-No deformities, no cyanosis or clubbing Neurologic-Normal cranial nerves; symmetric strength and tone Skin-Warm, no significant lesions Extremities-distal pulses intact; no edema  ASSESSMENT AND PLAN:  [place] Bing, MD 06/27/2011 1:30 PM

## 2011-06-27 NOTE — Assessment & Plan Note (Signed)
Symptoms are relatively mild and brief at present and do not appear to require additional evaluation or treatment.  Amlodipine had been used for hypertension in hopes of a beneficial effect on chest discomfort.  Since that medication was discontinued, there has been no apparent change in the pattern of her symptoms.

## 2011-06-28 ENCOUNTER — Encounter: Payer: Self-pay | Admitting: Cardiology

## 2011-06-28 NOTE — Procedures (Signed)
NAMEZOIEY, CHRISTY NO.:  [id number]  MEDICAL RECORD NO.:  [id number]  LOCATION:  RAD                           FACILITY:  [facility]  PHYSICIAN:  Gerrit Friends. Dietrich Pates, MD, FACCDATE OF BIRTH:  [birth year]  DATE OF PROCEDURE:  06/27/2011 DATE OF DISCHARGE:  06/23/2011                               HOLTER MONITOR   REFERRING PHYSICIAN:  Ramon Dredge [name], MD  CLINICAL DATA:  A 40 year old woman with palpitations. 1. Continuous electrocardiographic recording was maintained for 48     hours during which the predominant rhythm was normal sinus with     some sinus tachycardia to a peak heart rate of 130 BPM. 2. Occasional PVCs occurred at an average rate of 13 per hour with a     few paired PVCs. 3. Occasional PACs were also noted, occurring at an average rate of 8     per hour.  No significant arrhythmias were identified. 4. No ST-segment elevation or depression was noted. 5. Multiple symptomatic spells were reported, predominantly with     dyspnea and/or chest discomfort.  Palpitations occurred on 3 occasions during which     either normal sinus or normal sinus rhythm with a single PVC was     present as was the case for episodes with other symptoms.  IMPRESSION:  Unremarkable continuous electrocardiographic tracing revealing occasional PACs and PVCs without significant arrhythmias. While PVCs were noted during most of this symptomatic spells, there was not a clear correlation between rhythm and symptoms.     Gerrit Friends. Dietrich Pates, MD, [facility]     RMR/MEDQ  D:  06/27/2011  T:  06/28/2011  Job:  [id number]

## 2011-07-25 ENCOUNTER — Ambulatory Visit: Payer: Medicare Other | Admitting: Cardiology

## 2011-07-25 ENCOUNTER — Ambulatory Visit (INDEPENDENT_AMBULATORY_CARE_PROVIDER_SITE_OTHER): Payer: PRIVATE HEALTH INSURANCE | Admitting: Psychiatry

## 2011-07-25 ENCOUNTER — Encounter (HOSPITAL_COMMUNITY): Payer: Self-pay | Admitting: Psychiatry

## 2011-07-25 VITALS — Wt 185.0 lb

## 2011-07-25 DIAGNOSIS — F329 Major depressive disorder, single episode, unspecified: Secondary | ICD-10-CM

## 2011-07-25 MED ORDER — DULOXETINE HCL 30 MG PO CPEP: 30.0000 mg | ORAL_CAPSULE | Freq: Every day | ORAL | Status: DC

## 2011-07-25 MED ORDER — AMPHETAMINE-DEXTROAMPHETAMINE 15 MG PO TABS: 15.0000 mg | ORAL_TABLET | Freq: Every day | ORAL | Status: DC

## 2011-07-25 NOTE — Progress Notes (Signed)
Patient came in for her followup appointment. She is doing much better since we added Adderall. She has stopped taking Klonopin and Ambien and feel some increased energy. She is wondering of her Adderall can be further increase. She has recently seen Dr. Ollen Bowl and cardiologist. She has placed  Hol;ter monitor as she has complained of heart palpitations. However as per patient there is no problem in her heart and she was recommended to stop smoking and cut down her caffeine. Patient is reducing her caffeine and has noticed improvement in her heart palpitations. She felt better with the Adderall. Her affect is bright. She is more social and able to do things. She had a good Christmas. She denies any agitation anger or any crying spells. She reported no side effects of medication.  Mental status examination Patient is pleasant cooperative and maintained good eye contact. Her speech is soft but clear and coherent. Her thought process is logical linear and goal-directed. She described her mood is good and her affect is bright. There no psychotic symptoms present. She denies any active or passive suicidal thoughts or homicidal thoughts. Her attention and concentration is improved. She's alert oriented x3. Her insight judgment and impulse control is okay.  Assessment Maj. depressive disorder Depressive disorder due to general medical condition  Plan I will try Adderall 15 mg daily however I have explained in detail about the risks and benefits of medication including worsening of heart palpitations and that K. she need to stop and call us immediately. She'll continue to take Cymbalta 30 mg prescribed by her primary care physician. At this time she is not taking any benzodiazepine. She's also loss 5 pounds from her last visit. She's been were involved in exercise, socialization and watching her diet. I have explained risks and benefits of medication in detail and recommended to call us if she has any question or  concern or if she feels worsening of symptoms. I will see her again in one month.

## 2011-08-01 ENCOUNTER — Ambulatory Visit (INDEPENDENT_AMBULATORY_CARE_PROVIDER_SITE_OTHER): Payer: PRIVATE HEALTH INSURANCE | Admitting: Internal Medicine

## 2011-08-01 ENCOUNTER — Encounter (INDEPENDENT_AMBULATORY_CARE_PROVIDER_SITE_OTHER): Payer: Self-pay | Admitting: Internal Medicine

## 2011-08-01 VITALS — BP 120/78 | HR 82 | Temp 99.3°F | Resp 14 | Ht 60.0 in | Wt 187.5 lb

## 2011-08-01 DIAGNOSIS — K580 Irritable bowel syndrome with diarrhea: Secondary | ICD-10-CM | POA: Insufficient documentation

## 2011-08-01 DIAGNOSIS — Z79899 Other long term (current) drug therapy: Secondary | ICD-10-CM

## 2011-08-01 DIAGNOSIS — R350 Frequency of micturition: Secondary | ICD-10-CM

## 2011-08-01 DIAGNOSIS — K754 Autoimmune hepatitis: Secondary | ICD-10-CM

## 2011-08-01 DIAGNOSIS — K589 Irritable bowel syndrome without diarrhea: Secondary | ICD-10-CM

## 2011-08-01 DIAGNOSIS — K219 Gastro-esophageal reflux disease without esophagitis: Secondary | ICD-10-CM

## 2011-08-01 NOTE — Progress Notes (Signed)
Presenting complaint; Followup for GERD and IBS. Patient complains of increased urinary frequency and having to strain. Subjective: Mega is 41 year old Caucasian female who is here for scheduled visit. She was last seen in September 2012 for nausea vomiting bloating. At that time she had gastric emptying study which was within normal limits. In October she saw Dr. Iva Boop of [facility] and had her lab studies and she tells me all of these were normal. Dr. [name] recommended continuing current dose of azathioprine. She states she hasn't had much in the way of nausea or vomiting but she still has moments when she feels the food just sits in her stomach. She may have one or 2 episodes of heartburn per month. On most days she has formed stool she may have diarrhea occasionally. She denies melena or rectal bleeding. She says she does not feel well but she states she feels like this on most days. She denies sore throat shortness of breath or cough. She says she is joining [employer] so that she can start exercising regularly. She tells me she has a free membership  through courtesy of her insurance. She also complains of having to strain to urinate. Sometimes she passes to much urine and other times very little comes out. She denies dysuria, hematuria or flank pain. Current Medications: Current Outpatient Prescriptions  Medication Sig Dispense Refill  . amphetamine-dextroamphetamine (ADDERALL, 15MG ,) 15 MG tablet Take 1 tablet (15 mg total) by mouth daily.  30 tablet  0  . Ascorbic Acid (VITAMIN C) 100 MG tablet Take by mouth daily.        Marland Kitchen azaTHIOprine (IMURAN) 50 MG tablet take 1 tablet by mouth once daily  30 tablet  5  . calcium-vitamin D (OS-CAL 500 + D) 500-200 MG-UNIT per tablet Take 1 tablet by mouth 2 (two) times daily.       . Cyanocobalamin (B-12) 100 MCG TABS Take by mouth.        . dicyclomine (BENTYL) 10 MG capsule Take 10 mg by mouth as needed.       . DULoxetine (CYMBALTA) 30 MG capsule Take 1  capsule (30 mg total) by mouth daily.  30 capsule  1  . esomeprazole (NEXIUM) 40 MG capsule Take 40 mg by mouth daily before breakfast.        . estradiol (CLIMARA - DOSED IN MG/24 HR) 0.075 mg/24hr Place 1 patch onto the skin once a week.        . fexofenadine (ALLEGRA) 60 MG tablet Take 60 mg by mouth daily.        . furosemide (LASIX) 40 MG tablet Take 40 mg by mouth daily.       Marland Kitchen GARLIC PO Take by mouth daily.        Marland Kitchen levETIRAcetam (KEPPRA) 750 MG tablet Take 750 mg by mouth daily.        . Lido-Capsaicin-Men-Methyl Sal (MEDI-PATCH-LIDOCAINE EX) Apply topically as needed.        Marland Kitchen lisinopril (PRINIVIL,ZESTRIL) 10 MG tablet Take 10 mg by mouth daily.        . methadone (DOLOPHINE) 10 MG/5ML solution Take by mouth every 6 (six) hours as needed.        . methocarbamol (ROBAXIN) 500 MG tablet Take 500 mg by mouth 3 (three) times daily. As needed      . multivitamin-iron-minerals-folic acid (CENTRUM) chewable tablet Chew 1 tablet by mouth daily.        Marland Kitchen NASAL SPRAY SALINE NA Place into the  nose as needed.        Marland Kitchen NITROSTAT 0.4 MG SL tablet place 1 tablet under the tongue if needed every 5 minutes for chest pain for 3 doses IF NO RELIEF AFTER 3RD DOSE CALL PRESCRIBER OR 911.  25 tablet  3  . Nutritional Supplements (COLD AND FLU PO) Take by mouth as needed.        . pramipexole (MIRAPEX) 0.25 MG tablet Take 0.25 mg by mouth as needed.       . Probiotic Product (ALIGN PO) Take by mouth daily.          Objective: Blood pressure 120/78, pulse 82, temperature 99.3 F (37.4 C), temperature source Oral, resp. rate 14, height 5' (1.524 m), weight 187 lb 8 oz (85.049 kg). Conjunctiva is pink. Sclera is nonicteric Oral pharyngeal mucosa is normal. No neck masses or thyromegaly noted. Cardiac exam with regular rhythm normal S1 and S2. No murmur or gallop noted. Lungs are clear to auscultation. Abdomen is full. Bowel sounds are normal. Mild tenderness noted at LLQ and RUQ. Liver edge is indistinct  but does not appear to be enlarged. Spleen is not palpable. No LE edema or clubbing noted. Assessment: #1. Chronic autoimmune hepatitis. She appears to be in remission on low-dose azathioprine. Last set of labs was at [facility] about 3 months ago. #2. Urinary hesitancy and increased frequency. It may be related to her medications or she could have cystitis or  structural abnormality. May need evaluation by urologist if UA is negative. #3. Chronic GERD. Symptoms well controlled with therapy. #4. Irritable bowel syndrome. Symptoms reasonably well controlled with therapy.   Plan: Patient advised to call us if her temp is greater than 100.5 F. She will go to the lab for CBC, comprehensive chemistry panel and urine analysis. We will call patient with results of these studies and plan to see her back in 6 months.

## 2011-08-01 NOTE — Patient Instructions (Signed)
Notify if temperature above 100.5 f. Physician will contact you with results of urine analysis.

## 2011-08-02 LAB — CBC
Hemoglobin: 14 g/dL (ref 12.0–15.0)
MCH: 31.3 pg (ref 26.0–34.0)
MCV: 91.5 fL (ref 78.0–100.0)
RBC: 4.47 MIL/uL (ref 3.87–5.11)

## 2011-08-02 LAB — COMPREHENSIVE METABOLIC PANEL
CO2: 30 mEq/L (ref 19–32)
Calcium: 9.4 mg/dL (ref 8.4–10.5)
Creat: 0.64 mg/dL (ref 0.50–1.10)
Glucose, Bld: 74 mg/dL (ref 70–99)
Total Bilirubin: 0.3 mg/dL (ref 0.3–1.2)
Total Protein: 7.3 g/dL (ref 6.0–8.3)

## 2011-08-02 LAB — URINALYSIS, ROUTINE W REFLEX MICROSCOPIC
Bilirubin Urine: NEGATIVE
Glucose, UA: NEGATIVE mg/dL
Ketones, ur: NEGATIVE mg/dL
Specific Gravity, Urine: 1.006 (ref 1.005–1.030)
pH: 7.5 (ref 5.0–8.0)

## 2011-08-11 ENCOUNTER — Encounter (HOSPITAL_COMMUNITY): Payer: Self-pay | Admitting: Psychiatry

## 2011-08-11 ENCOUNTER — Ambulatory Visit (INDEPENDENT_AMBULATORY_CARE_PROVIDER_SITE_OTHER): Payer: PRIVATE HEALTH INSURANCE | Admitting: Psychiatry

## 2011-08-11 DIAGNOSIS — F331 Major depressive disorder, recurrent, moderate: Secondary | ICD-10-CM
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
## 2011-08-11 NOTE — Patient Instructions (Signed)
Discussed orally 

## 2011-08-16 ENCOUNTER — Ambulatory Visit (HOSPITAL_COMMUNITY): Payer: Medicare Other | Admitting: Psychiatry

## 2011-08-23 ENCOUNTER — Other Ambulatory Visit (HOSPITAL_COMMUNITY): Payer: Self-pay | Admitting: Urology

## 2011-08-23 DIAGNOSIS — R39198 Other difficulties with micturition: Secondary | ICD-10-CM

## 2011-08-23 DIAGNOSIS — R32 Unspecified urinary incontinence: Secondary | ICD-10-CM

## 2011-08-24 ENCOUNTER — Ambulatory Visit (INDEPENDENT_AMBULATORY_CARE_PROVIDER_SITE_OTHER): Payer: PRIVATE HEALTH INSURANCE | Admitting: Psychiatry

## 2011-08-24 ENCOUNTER — Encounter (HOSPITAL_COMMUNITY): Payer: Self-pay | Admitting: Psychiatry

## 2011-08-24 VITALS — Wt 184.0 lb

## 2011-08-24 DIAGNOSIS — F329 Major depressive disorder, single episode, unspecified: Secondary | ICD-10-CM

## 2011-08-24 MED ORDER — AMPHETAMINE-DEXTROAMPHETAMINE 15 MG PO TABS: 15.0000 mg | ORAL_TABLET | Freq: Every day | ORAL | Status: DC
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
## 2011-08-28 ENCOUNTER — Ambulatory Visit (HOSPITAL_COMMUNITY)
Admission: RE | Admit: 2011-08-28 | Discharge: 2011-08-28 | Disposition: A | Discharge status: Home / Self Care | Payer: PRIVATE HEALTH INSURANCE | Source: Ambulatory Visit | Attending: Urology | Admitting: Urology

## 2011-08-28 DIAGNOSIS — R300 Dysuria: Secondary | ICD-10-CM | POA: Insufficient documentation

## 2011-08-28 DIAGNOSIS — R109 Unspecified abdominal pain: Secondary | ICD-10-CM | POA: Insufficient documentation

## 2011-08-28 DIAGNOSIS — N393 Stress incontinence (female) (male): Secondary | ICD-10-CM | POA: Insufficient documentation

## 2011-08-28 DIAGNOSIS — R3989 Other symptoms and signs involving the genitourinary system: Secondary | ICD-10-CM | POA: Insufficient documentation

## 2011-08-28 DIAGNOSIS — R39198 Other difficulties with micturition: Secondary | ICD-10-CM

## 2011-08-28 DIAGNOSIS — R32 Unspecified urinary incontinence: Secondary | ICD-10-CM

## 2011-08-30 ENCOUNTER — Other Ambulatory Visit: Payer: Self-pay | Admitting: Pulmonary Disease

## 2011-08-30 DIAGNOSIS — N6320 Unspecified lump in the left breast, unspecified quadrant: Secondary | ICD-10-CM

## 2011-09-01 ENCOUNTER — Ambulatory Visit (HOSPITAL_COMMUNITY): Payer: PRIVATE HEALTH INSURANCE | Admitting: Psychiatry

## 2011-09-08 ENCOUNTER — Telehealth: Payer: Self-pay | Admitting: Cardiology

## 2011-09-08 NOTE — Telephone Encounter (Signed)
Please advise.  According to last OVN in 12/12, pt is to follow up prn, therefore I would presume no further testing would be needed.

## 2011-09-08 NOTE — Telephone Encounter (Signed)
PT IS HAVING A TVT-TENTION VAGINA SLING. CHOUICE ANST. OUT PATIENT DR JAVIS WILL BE DOING PROCEDURE. THIS IS SCHEDULED FOR 09/12/11 AND THEY NEED A CLEARANCE LETTER.

## 2011-09-09 NOTE — Telephone Encounter (Signed)
Patient has no known cardiac disease and has not been followed actively in this office; however, she does have multiple significant medical problems.  Preoperative assessment by her primary care physician would be the most appropriate venue.

## 2011-09-11 ENCOUNTER — Other Ambulatory Visit: Payer: Self-pay

## 2011-09-11 ENCOUNTER — Encounter (HOSPITAL_COMMUNITY): Payer: Self-pay

## 2011-09-11 ENCOUNTER — Encounter (HOSPITAL_COMMUNITY)
Admission: RE | Admit: 2011-09-11 | Discharge: 2011-09-11 | Disposition: A | Discharge status: Home / Self Care | Payer: PRIVATE HEALTH INSURANCE | Source: Ambulatory Visit | Attending: Urology | Admitting: Urology

## 2011-09-11 ENCOUNTER — Encounter (HOSPITAL_COMMUNITY): Payer: Self-pay | Admitting: Pharmacy Technician

## 2011-09-11 HISTORY — DX: Cardiac arrhythmia, unspecified: I49.9

## 2011-09-11 HISTORY — DX: Unspecified osteoarthritis, unspecified site: M19.90

## 2011-09-11 HISTORY — DX: Angina pectoris, unspecified: I20.9

## 2011-09-11 HISTORY — DX: Shortness of breath: R06.02

## 2011-09-11 LAB — CBC
Hemoglobin: 13.6 g/dL (ref 12.0–15.0)
Platelets: 277 10*3/uL (ref 150–400)
RBC: 4.35 MIL/uL (ref 3.87–5.11)
WBC: 8.7 10*3/uL (ref 4.0–10.5)

## 2011-09-11 LAB — DIFFERENTIAL
Lymphs Abs: 2.1 10*3/uL (ref 0.7–4.0)
Monocytes Relative: 9 % (ref 3–12)
Neutro Abs: 5.7 10*3/uL (ref 1.7–7.7)
Neutrophils Relative %: 65 % (ref 43–77)

## 2011-09-11 LAB — SURGICAL PCR SCREEN
MRSA, PCR: NEGATIVE
Staphylococcus aureus: NEGATIVE

## 2011-09-11 LAB — BASIC METABOLIC PANEL
BUN: 6 mg/dL (ref 6–23)
Chloride: 102 mEq/L (ref 96–112)
GFR calc Af Amer: >90 mL/min (ref >90)
GFR calc non Af Amer: >90 mL/min (ref >90)
Glucose, Bld: 94 mg/dL (ref 70–99)
Potassium: 4.6 mEq/L (ref 3.5–5.1)
Sodium: 140 mEq/L (ref 135–145)

## 2011-09-11 NOTE — Patient Instructions (Addendum)
19 Lindsay Walsh  09/11/2011   Your procedure is scheduled on:  09/12/11  Report to [facility] at 08:30 AM.  Call this number if you have problems the morning of surgery: [phone]   Remember:   Do not eat food:After Midnight.  May have clear liquids:until Midnight .  Clear liquids include soda, tea, black coffee, apple or grape juice, broth.  Take these medicines the morning of surgery with A SIP OF WATER: cymbalta, adderall, klonopin, nexium, lisinopril, methadone, keppra   Do not wear jewelry, make-up or nail polish.  Do not wear lotions, powders, or perfumes. You may wear deodorant.  Do not shave 48 hours prior to surgery.  Do not bring valuables to the hospital.  Contacts, dentures or bridgework may not be worn into surgery.  Leave suitcase in the car. After surgery it may be brought to your room.  For patients admitted to the hospital, checkout time is 11:00 AM the day of discharge.   Patients discharged the day of surgery will not be allowed to drive home.  Name and phone number of your driver:   Special Instructions: CHG Shower Use Special Wash: 1/2 bottle night before surgery and 1/2 bottle morning of surgery.   Please read over the following fact sheets that you were given: Pain Booklet, MRSA Information, Surgical Site Infection Prevention, Anesthesia Post-op Instructions and Care and Recovery After Surgery    Tension Free Vaginal Tape System (TVT) Tension free vaginal tape (TVT or TFT) procedure is a surgery procedure to correct involuntary leaking from the bladder(urinary stress incontinence) in women. A 1/3-inch prolene mesh tape is used as a sling underneath the area where the bladder and tube that drains the bladder(urethra) connect. This restores the muscle and connective tissue (fascia) that have been stretched and damaged from having babies, or from chronic stress and straining. The operation takes less than an hour. It is an outpatient procedure (you go home the same  day). It has an 80 to 90% success rate. LET YOUR CAREGIVER KNOW ABOUT:   If you have any allergies, especially to medicines.   All the medicines you are taking, including prescription drugs, over-the-counter drugs, herbal medicines, eye drops, and creams.   Previous problems with anesthesia or Novocaine.   History of blood clots or other bleeding problems.   Previous surgery, especially bladder or vaginal surgery.   Other health problems or medical diseases.  RISKS AND COMPLICATIONS   Bleeding.   Infection.   Injury to surrounding organs and blood vessels.   Problems with urination after the surgery.   Failure of the operation to work as expected.   Allergy to the mesh tape.   Problems or allergies to the anesthetic.  BEFORE THE PROCEDURE   Do not eat or drink anything for 12 hours before the surgery.   Do not take aspirin or blood thinners for one week before the surgery.   Let your caregiver know if you develop a cold or an infection before the operation.   If you are admitted on the day of surgery, arrive at the hospital an hour before the operation, to sign any necessary forms and to get prepared for the surgery.   There is a waiting area available for family and friends during the surgery.   Let your caregiver know if you think you may be pregnant.  PROCEDURE  An intravenous (IV) will be placed in your arm, and a medicine will be given to relax you, but not put  you to sleep. You will be given a local anesthetic in the area above the pubic bone and along the top of the vagina. Two very small incisions will be made just above the pubic bone. The lining (mucosa) at the top of the vagina will be opened and separated on each side of the urethra and bladder. A catheter is then placed in the bladder, for filling and drainage during the procedure. Then the prolene mesh tape is placed under the urethra, and the ends are passed up behind the pubic bone, just under the skin.  After the mesh tape is properly placed, the catheter is removed. The patient is asked to cough, so that adjustments can be made to the tape, to make sure there is no leaking of urine before stitching (suturing) the mesh tape in place. The ends of the mesh tape are brought up, just under the skin, and sutured in place. The skin incisions are sutured closed. The vaginal lining is also stitched closed, and you are taken to the recovery room. This operation should not be done if the woman is pregnant or plans to get pregnant, is on blood thinners (anticoagulant treatment), or has a urinary tract infection. AFTER THE PROCEDURE  You will go to a postoperative recovery area until your vital signs (blood pressure, pulse, breathing, and temperature) are stable. You may need to stay in the hospital a day or two, but most patients are sent home the day of the surgery. You may be sent home with pain pills or an antibiotic, if necessary. HOME CARE INSTRUCTIONS   Follow your caregiver's advice about diet, rest, driving, exercise, medicines, and follow-up appointments.   You may take over-the-counter pain medicine, with your caregiver's recommendation.   Do not take aspirin. It can cause bleeding.   Do not lift over 5 pounds, until your caregiver says it is ok.   Do not have sexual intercourse, until your caregiver says it is ok.   Do not use tampons.   You may take a laxative if needed, with your caregiver's recommendation.   You may take sitz baths 2 to 3 times a day with your caregiver's advice.  SEEK MEDICAL CARE IF:   There is increasing pain in the wound area.   There is increased swelling and redness in the wound area.   You develop a rash.   You develop nausea, diarrhea, constipation or vomiting.   You think the stitches in the wound are breaking up.   You lose urine when you cough or sneeze.  SEEK IMMEDIATE MEDICAL CARE IF:   You have an oral temperature above 102 F (38.9 C), not  controlled by medicine.   You begin to bleed from the wound area, above the pubic bone or vagina.   You see pus coming from the incisions.   You develop an abnormal vaginal discharge.   You cannot urinate.   You develop bloody or painful urination.   You pass out.   You develop chest or leg pain.   You develop abdominal pain.  Document Released: 07/18/2009 Document Revised: 06/15/2011 Document Reviewed: 07/18/2009 [facility] Patient Information 2012 [place], [place].   PATIENT INSTRUCTIONS POST-ANESTHESIA  IMMEDIATELY FOLLOWING SURGERY:  Do not drive or operate machinery for the first twenty four hours after surgery.  Do not make any important decisions for twenty four hours after surgery or while taking narcotic pain medications or sedatives.  If you develop intractable nausea and vomiting or a severe headache please notify your  doctor immediately.  FOLLOW-UP:  Please make an appointment with your surgeon as instructed. You do not need to follow up with anesthesia unless specifically instructed to do so.  WOUND CARE INSTRUCTIONS (if applicable):  Keep a dry clean dressing on the anesthesia/puncture wound site if there is drainage.  Once the wound has quit draining you may leave it open to air.  Generally you should leave the bandage intact for twenty four hours unless there is drainage.  If the epidural site drains for more than 36-48 hours please call the anesthesia department.  QUESTIONS?:  Please feel free to call your physician or the hospital operator if you have any questions, and they will be happy to assist you.     [facility] Anesthesia Department [address] [place] [phone]

## 2011-09-11 NOTE — Telephone Encounter (Signed)
Discussed suggestions with Elease Hashimoto in Dr Rudean Haskell office.  Dr Juanetta Gosling is out of town this week, therefore will pass information on to Dr Jayme Cloud during her pre op assessment today, per Trula Ore in day surgery.

## 2011-09-12 ENCOUNTER — Ambulatory Visit (HOSPITAL_COMMUNITY): Payer: PRIVATE HEALTH INSURANCE | Admitting: Anesthesiology

## 2011-09-12 ENCOUNTER — Encounter (HOSPITAL_COMMUNITY): Payer: Self-pay | Admitting: Anesthesiology

## 2011-09-12 ENCOUNTER — Observation Stay (HOSPITAL_COMMUNITY)
Admission: RE | Admit: 2011-09-12 | Discharge: 2011-09-13 | Disposition: A | Discharge status: Home / Self Care | Payer: PRIVATE HEALTH INSURANCE | Source: Ambulatory Visit | Attending: Urology | Admitting: Urology

## 2011-09-12 ENCOUNTER — Encounter (HOSPITAL_COMMUNITY)
Admission: RE | Disposition: A | Discharge status: Home / Self Care | Payer: Self-pay | Source: Ambulatory Visit | Attending: Urology

## 2011-09-12 ENCOUNTER — Encounter (HOSPITAL_COMMUNITY): Payer: Self-pay | Admitting: *Deleted

## 2011-09-12 DIAGNOSIS — K754 Autoimmune hepatitis: Secondary | ICD-10-CM

## 2011-09-12 DIAGNOSIS — K219 Gastro-esophageal reflux disease without esophagitis: Secondary | ICD-10-CM | POA: Insufficient documentation

## 2011-09-12 DIAGNOSIS — K589 Irritable bowel syndrome without diarrhea: Secondary | ICD-10-CM

## 2011-09-12 DIAGNOSIS — I10 Essential (primary) hypertension: Secondary | ICD-10-CM

## 2011-09-12 DIAGNOSIS — F17200 Nicotine dependence, unspecified, uncomplicated: Secondary | ICD-10-CM

## 2011-09-12 DIAGNOSIS — Z01810 Encounter for preprocedural cardiovascular examination: Secondary | ICD-10-CM | POA: Insufficient documentation

## 2011-09-12 DIAGNOSIS — R7301 Impaired fasting glucose: Secondary | ICD-10-CM

## 2011-09-12 DIAGNOSIS — F1910 Other psychoactive substance abuse, uncomplicated: Secondary | ICD-10-CM

## 2011-09-12 DIAGNOSIS — Z01812 Encounter for preprocedural laboratory examination: Secondary | ICD-10-CM | POA: Insufficient documentation

## 2011-09-12 DIAGNOSIS — F329 Major depressive disorder, single episode, unspecified: Secondary | ICD-10-CM

## 2011-09-12 DIAGNOSIS — F3289 Other specified depressive episodes: Secondary | ICD-10-CM | POA: Insufficient documentation

## 2011-09-12 DIAGNOSIS — N393 Stress incontinence (female) (male): Principal | ICD-10-CM | POA: Insufficient documentation

## 2011-09-12 DIAGNOSIS — IMO0001 Reserved for inherently not codable concepts without codable children: Secondary | ICD-10-CM | POA: Insufficient documentation

## 2011-09-12 DIAGNOSIS — J984 Other disorders of lung: Secondary | ICD-10-CM

## 2011-09-12 HISTORY — PX: BLADDER SUSPENSION: SHX72

## 2011-09-12 SURGERY — URETHROPEXY, USING TRANSVAGINAL TAPE
Anesthesia: General | Wound class: Clean Contaminated

## 2011-09-12 MED ORDER — ROCURONIUM BROMIDE 100 MG/10ML IV SOLN
INTRAVENOUS | Status: DC | PRN
  Administered 2011-09-12: 5 mg via INTRAVENOUS
  Administered 2011-09-12: 35 mg via INTRAVENOUS

## 2011-09-12 MED ORDER — SODIUM CHLORIDE 0.9 % IR SOLN
Status: DC | PRN
  Administered 2011-09-12: 3000 mL

## 2011-09-12 MED ORDER — ONDANSETRON HCL 4 MG/2ML IJ SOLN: 4.0000 mg | Freq: Once | INTRAMUSCULAR | Status: DC | PRN

## 2011-09-12 MED ORDER — CIPROFLOXACIN HCL 250 MG PO TABS: 250.0000 mg | ORAL_TABLET | Freq: Two times a day (BID) | ORAL | Status: DC

## 2011-09-12 MED ORDER — CEFAZOLIN SODIUM 1-5 GM-% IV SOLN
INTRAVENOUS | Status: AC
  Filled 2011-09-12: qty 50

## 2011-09-12 MED ORDER — ROCURONIUM BROMIDE 50 MG/5ML IV SOLN
INTRAVENOUS | Status: AC
  Filled 2011-09-12: qty 1

## 2011-09-12 MED ORDER — SODIUM CHLORIDE 0.9 % IV SOLN: 0.5000 mg/h | Freq: Once | INTRAVENOUS | Status: DC

## 2011-09-12 MED ORDER — HYDROMORPHONE HCL PF 1 MG/ML IJ SOLN: 0.5000 mg | Freq: Once | INTRAMUSCULAR | Status: DC

## 2011-09-12 MED ORDER — CEFAZOLIN SODIUM 1-5 GM-% IV SOLN: 1.0000 g | Freq: Once | INTRAVENOUS | Status: DC

## 2011-09-12 MED ORDER — GLYCOPYRROLATE 0.2 MG/ML IJ SOLN
INTRAMUSCULAR | Status: AC
  Filled 2011-09-12: qty 1

## 2011-09-12 MED ORDER — NEOSTIGMINE METHYLSULFATE 1 MG/ML IJ SOLN
INTRAMUSCULAR | Status: DC | PRN
  Administered 2011-09-12: 3 mg via INTRAVENOUS

## 2011-09-12 MED ORDER — HYDROMORPHONE HCL PF 1 MG/ML IJ SOLN
0.2500 mg | INTRAMUSCULAR | Status: DC | PRN
  Administered 2011-09-12 (×2): 0.5 mg via INTRAVENOUS

## 2011-09-12 MED ORDER — GLYCOPYRROLATE 0.2 MG/ML IJ SOLN
INTRAMUSCULAR | Status: DC | PRN
  Administered 2011-09-12: .6 mg via INTRAVENOUS

## 2011-09-12 MED ORDER — LIDOCAINE HCL (PF) 1 % IJ SOLN
INTRAMUSCULAR | Status: AC
  Filled 2011-09-12: qty 5

## 2011-09-12 MED ORDER — MIDAZOLAM HCL 2 MG/2ML IJ SOLN
1.0000 mg | INTRAMUSCULAR | Status: DC | PRN
  Administered 2011-09-12: 2 mg via INTRAVENOUS

## 2011-09-12 MED ORDER — SODIUM CHLORIDE 0.9 % IR SOLN
Status: DC | PRN
  Administered 2011-09-12: 1000 mL

## 2011-09-12 MED ORDER — EPHEDRINE SULFATE 50 MG/ML IJ SOLN
INTRAMUSCULAR | Status: AC
  Filled 2011-09-12: qty 1

## 2011-09-12 MED ORDER — LIDOCAINE HCL 1 % IJ SOLN
INTRAMUSCULAR | Status: DC | PRN
  Administered 2011-09-12: 40 mg via INTRADERMAL

## 2011-09-12 MED ORDER — HYDROMORPHONE HCL PF 1 MG/ML IJ SOLN
INTRAMUSCULAR | Status: AC
  Administered 2011-09-12: 0.5 mg via INTRAVENOUS
  Filled 2011-09-12: qty 1

## 2011-09-12 MED ORDER — CIPROFLOXACIN HCL 250 MG PO TABS
250.0000 mg | ORAL_TABLET | Freq: Two times a day (BID) | ORAL | Status: DC
  Administered 2011-09-12 – 2011-09-13 (×2): 250 mg via ORAL
  Filled 2011-09-12 (×2): qty 1

## 2011-09-12 MED ORDER — NICOTINE 21 MG/24HR TD PT24
21.0000 mg | MEDICATED_PATCH | Freq: Every day | TRANSDERMAL | Status: DC
  Administered 2011-09-13: 21 mg via TRANSDERMAL
  Filled 2011-09-12: qty 1

## 2011-09-12 MED ORDER — NEOSTIGMINE METHYLSULFATE 1 MG/ML IJ SOLN
INTRAMUSCULAR | Status: AC
  Filled 2011-09-12: qty 10

## 2011-09-12 MED ORDER — PROPOFOL 10 MG/ML IV BOLUS
INTRAVENOUS | Status: DC | PRN
  Administered 2011-09-12: 150 mg via INTRAVENOUS

## 2011-09-12 MED ORDER — SUFENTANIL CITRATE 50 MCG/ML IV SOLN
INTRAVENOUS | Status: AC
  Filled 2011-09-12: qty 1

## 2011-09-12 MED ORDER — PROPOFOL 10 MG/ML IV EMUL
INTRAVENOUS | Status: AC
  Filled 2011-09-12: qty 20

## 2011-09-12 MED ORDER — SUFENTANIL CITRATE 50 MCG/ML IV SOLN
INTRAVENOUS | Status: DC | PRN
  Administered 2011-09-12: 5 ug via INTRAVENOUS
  Administered 2011-09-12: 10 ug via INTRAVENOUS
  Administered 2011-09-12 (×2): 5 ug via INTRAVENOUS

## 2011-09-12 MED ORDER — OXYCODONE-ACETAMINOPHEN 5-325 MG PO TABS: 1.5000 | ORAL_TABLET | Freq: Four times a day (QID) | ORAL | Status: DC | PRN

## 2011-09-12 MED ORDER — OXYCODONE-ACETAMINOPHEN 5-325 MG PO TABS
1.5000 | ORAL_TABLET | Freq: Four times a day (QID) | ORAL | Status: DC | PRN
  Administered 2011-09-12 – 2011-09-13 (×3): 1.5 via ORAL
  Filled 2011-09-12 (×3): qty 2

## 2011-09-12 MED ORDER — STERILE WATER FOR IRRIGATION IR SOLN
Status: DC | PRN
  Administered 2011-09-12: 1000 mL

## 2011-09-12 MED ORDER — NICOTINE 21 MG/24HR TD PT24
21.0000 mg | MEDICATED_PATCH | Freq: Every day | TRANSDERMAL | Status: DC
  Filled 2011-09-12: qty 1

## 2011-09-12 MED ORDER — CEFAZOLIN SODIUM 1-5 GM-% IV SOLN
INTRAVENOUS | Status: DC | PRN
  Administered 2011-09-12: 1 g via INTRAVENOUS

## 2011-09-12 MED ORDER — LACTATED RINGERS IV SOLN
INTRAVENOUS | Status: DC
  Administered 2011-09-12 (×2): via INTRAVENOUS

## 2011-09-12 MED ORDER — MIDAZOLAM HCL 2 MG/2ML IJ SOLN
INTRAMUSCULAR | Status: AC
  Administered 2011-09-12: 2 mg via INTRAVENOUS
  Filled 2011-09-12: qty 2

## 2011-09-12 SURGICAL SUPPLY — 44 items
ADH SKN CLS APL DERMABOND .7 (GAUZE/BANDAGES/DRESSINGS) ×2
BAG DRAIN URO TABLE W/ADPT NS (DRAPE) ×1 IMPLANT
BAG DRN 8 ADPR NS SKTRN CSTL (DRAPE) ×1
BAG URINE DRAINAGE (UROLOGICAL SUPPLIES) IMPLANT
CATH FOLEY 2WAY SLVR  5CC 18FR (CATHETERS) ×1
CATH FOLEY 2WAY SLVR  5CC 20FR (CATHETERS) ×1
CATH FOLEY 2WAY SLVR 5CC 18FR (CATHETERS) ×1 IMPLANT
CATH FOLEY 2WAY SLVR 5CC 20FR (CATHETERS) ×1 IMPLANT
CLOTH BEACON ORANGE TIMEOUT ST (SAFETY) ×2 IMPLANT
COVER LIGHT HANDLE STERIS (MISCELLANEOUS) ×4 IMPLANT
DERMABOND ADVANCED (GAUZE/BANDAGES/DRESSINGS) ×2
DERMABOND ADVANCED .7 DNX12 (GAUZE/BANDAGES/DRESSINGS) IMPLANT
ELECT REM PT RETURN 9FT ADLT (ELECTROSURGICAL) ×2
ELECTRODE REM PT RTRN 9FT ADLT (ELECTROSURGICAL) ×1 IMPLANT
GLOVE BIO SURGEON STRL SZ7 (GLOVE) ×2 IMPLANT
GLOVE ECLIPSE 6.5 STRL STRAW (GLOVE) ×1 IMPLANT
GLOVE EXAM NITRILE MD LF STRL (GLOVE) ×1 IMPLANT
GLOVE INDICATOR 7.0 STRL GRN (GLOVE) ×2 IMPLANT
GLOVE SS BIOGEL STRL SZ 6.5 (GLOVE) IMPLANT
GLOVE SUPERSENSE BIOGEL SZ 6.5 (GLOVE) ×1
GOWN STRL REIN XL XLG (GOWN DISPOSABLE) ×6 IMPLANT
INST SET MINOR GENERAL (KITS) ×2 IMPLANT
IV NS IRRIG 3000ML ARTHROMATIC (IV SOLUTION) ×2 IMPLANT
KIT ROOM TURNOVER AP CYSTO (KITS) ×2 IMPLANT
LUBRICANT JELLY 4.5OZ STERILE (MISCELLANEOUS) ×2 IMPLANT
MANIFOLD NEPTUNE II (INSTRUMENTS) ×2 IMPLANT
NDL HYPO 18GX1.5 BLUNT FILL (NEEDLE) ×1 IMPLANT
NEEDLE HYPO 18GX1.5 BLUNT FILL (NEEDLE) ×2 IMPLANT
NEEDLE HYPO 22GX1.5 SAFETY (NEEDLE) ×2 IMPLANT
NS IRRIG 1000ML POUR BTL (IV SOLUTION) ×2 IMPLANT
PACK PERI GYN (CUSTOM PROCEDURE TRAY) ×2 IMPLANT
PAD ARMBOARD 7.5X6 YLW CONV (MISCELLANEOUS) ×2 IMPLANT
SET BASIN LINEN APH (SET/KITS/TRAYS/PACK) ×2 IMPLANT
SET IRRIG Y TYPE TUR BLADDER L (SET/KITS/TRAYS/PACK) ×2 IMPLANT
SLING RETROPUBLIC (Sling) ×1 IMPLANT
SPONGE INTESTINAL PEANUT (DISPOSABLE) ×1 IMPLANT
SUT SILK 0 FSL (SUTURE) ×1 IMPLANT
SUT VIC AB 3-0 SH 27 (SUTURE) ×4
SUT VIC AB 3-0 SH 27X BRD (SUTURE) ×1 IMPLANT
SYR BULB IRRIGATION 50ML (SYRINGE) ×2 IMPLANT
SYR CONTROL 10ML LL (SYRINGE) ×2 IMPLANT
SYRINGE 10CC LL (SYRINGE) ×2 IMPLANT
WATER STERILE IRR 1000ML POUR (IV SOLUTION) ×2 IMPLANT
YANKAUER SUCT 12FT TUBE ARGYLE (SUCTIONS) ×2 IMPLANT

## 2011-09-12 NOTE — Anesthesia Postprocedure Evaluation (Addendum)
  Anesthesia Post-op Note  Patient: Lindsay Walsh  Procedure(s) Performed: Procedure(s) (LRB): TRANSVAGINAL TAPE (TVT) PROCEDURE (N/A)  Patient Location: PACU  Anesthesia Type: General  Level of Consciousness: awake, alert  and oriented  Airway and Oxygen Therapy: Patient Spontanous Breathing and Patient connected to face mask oxygen  Post-op Pain: mild  Post-op Assessment: Post-op Vital signs reviewed, Patient's Cardiovascular Status Stable, Respiratory Function Stable, Patent Airway and No signs of Nausea or vomiting  Post-op Vital Signs: Reviewed and stable  Complications: No apparent anesthesia complications  09/12/11:  Re-entered chart to change charges to show ancef was given pre-incision.

## 2011-09-12 NOTE — Progress Notes (Signed)
No change in H&Pon reexamination. i told her again that in case she goes into retention i will have to go and remove the tape and in that case she will still have stress urinary in continence. This surgery is only to correct stress urinary incontinence.

## 2011-09-12 NOTE — Addendum Note (Signed)
Addendum  created 09/12/11 1422 by Glynn Octave, CRNA   Modules edited:Charges VN, Notes Section

## 2011-09-12 NOTE — Progress Notes (Signed)
[phone]

## 2011-09-12 NOTE — Anesthesia Procedure Notes (Signed)
Procedure Name: Intubation Date/Time: 09/12/2011 9:10 AM Performed by: Glynn Octave Pre-anesthesia Checklist: Patient identified, Patient being monitored, Timeout performed, Emergency Drugs available and Suction available Patient Re-evaluated:Patient Re-evaluated prior to inductionOxygen Delivery Method: Circle System Utilized Preoxygenation: Pre-oxygenation with 100% oxygen Intubation Type: IV induction, Rapid sequence and Cricoid Pressure applied Laryngoscope Size: Mac and 3 Grade View: Grade II Tube type: Oral Tube size: 7.0 mm Number of attempts: 1 Airway Equipment and Method: stylet Placement Confirmation: ETT inserted through vocal cords under direct vision,  positive ETCO2 and breath sounds checked- equal and bilateral Secured at: 20 cm Tube secured with: Tape Dental Injury: Teeth and Oropharynx as per pre-operative assessment

## 2011-09-12 NOTE — Brief Op Note (Signed)
09/12/2011  10:16 AM  PATIENT:  Lindsay Walsh  41 y.o. female  PRE-OPERATIVE DIAGNOSIS:  stress urinary incontinence  POST-OPERATIVE DIAGNOSIS:  stress urinary incontinence  PROCEDURE:  Procedure(s) (LRB): TRANSVAGINAL TAPE (TVT) PROCEDURE (N/A)  SURGEON:  Surgeon(s) and Role:    * Ky Barban, MD - Primary  PHYSICIAN ASSISTANT:   ASSISTANTS: none   ANESTHESIA:   general  EBL:  Total I/O In: 1000 [I.V.:1000] Out: -   BLOOD ADMINISTERED:none  DRAINS: none   LOCAL MEDICATIONS USED:  NONE  SPECIMEN:  No Specimen  DISPOSITION OF SPECIMEN:  N/A  COUNTS:  YES  TOURNIQUET:  * No tourniquets in log *  DICTATION: .  PLAN OF CARE:   PATIENT DISPOSITION:  dictation#[id number]   Delay start of Pharmacological VTE agent (>24hrs) due to surgical blood loss or risk of bleeding:

## 2011-09-12 NOTE — Progress Notes (Signed)
Dr Jerre Simon notified of pts inability to void. Orders received to catherize pt and admit overnight. To go home in am with catheter. To go to Dr Rudean Haskell office tomorrow. Pt. catherized without difficulty. Obtained 350 ml clear yellow urine.

## 2011-09-12 NOTE — Progress Notes (Signed)
Pt unable to void. States she feel the need to void badly.

## 2011-09-12 NOTE — Anesthesia Preprocedure Evaluation (Signed)
Anesthesia Evaluation  Patient identified by MRN, date of birth, ID band Patient awake    Reviewed: Allergy & Precautions, H&P , NPO status , Patient's Chart, lab work & pertinent test results  History of Anesthesia Complications Negative for: history of anesthetic complications  Airway Mallampati: II      Dental  (+) Teeth Intact, Poor Dentition and Partial Upper   Pulmonary shortness of breath, Current Smoker,  breath sounds clear to auscultation        Cardiovascular hypertension, Pt. on medications + angina (none recently) + dysrhythmias (PAC, PVC) Rhythm:Regular Rate:Normal     Neuro/Psych PSYCHIATRIC DISORDERS Depression    GI/Hepatic GERD-  Medicated and Controlled,(+) Hepatitis -, Autoimmune  Endo/Other    Renal/GU      Musculoskeletal  (+) Fibromyalgia -  Abdominal (+) + obese,   Peds  Hematology   Anesthesia Other Findings   Reproductive/Obstetrics                           Anesthesia Physical Anesthesia Plan  ASA: III  Anesthesia Plan: General   Post-op Pain Management:    Induction: Intravenous, Rapid sequence and Cricoid pressure planned  Airway Management Planned: Oral ETT  Additional Equipment:   Intra-op Plan:   Post-operative Plan: Extubation in OR  Informed Consent: I have reviewed the patients History and Physical, chart, labs and discussed the procedure including the risks, benefits and alternatives for the proposed anesthesia with the patient or authorized representative who has indicated his/her understanding and acceptance.     Plan Discussed with:   Anesthesia Plan Comments:         Anesthesia Quick Evaluation

## 2011-09-12 NOTE — Discharge Instructions (Signed)
Tension Free Vaginal Tape System Care After Please read the instructions below. Refer to these instructions for the next few weeks. These instructions provide you with general information on caring for yourself after your operation. Your caregiver may also give you specific instructions. While your treatment has been planned according to the most current medical practices available, unavoidable problems sometimes occur. Discomfort from the operation area is normal for a couple of weeks. If you have any questions or problems after discharge, please call your caregiver. HOME CARE INSTRUCTIONS   Take your prescribed medications as directed by your caregiver.   You may take over-the-counter medication for minor pain with your caregiver's recommendation.   You may resume your usual diet.   Do not take aspirin. It can cause bleeding.   It is helpful to have a responsible person with you for a few days or a week after the surgery.   Shower or bath as directed.   Do not lift anything over 5 pounds.   Change your dressing as directed.   Do not drive until your caregiver says it is OK.   Do not have sexual intercourse until your caregiver says it is OK.   Do not use tampons.   You may take a laxative with your caregiver's recommendation.   You may take sitz baths 2 to 3 times a day with your caregiver's advice.   Make and keep your postoperative appointments.  SEEK MEDICAL CARE IF:   There is increasing pain in the wound area.   There is swelling or redness in the wound area.   You develop abnormal vaginal discharge.   You develop a rash.   You develop nausea, vomiting, constipation or diarrhea.   You think the stitches in the wound are breaking.   You lose urine when you cough.   You have problems with your medications.  SEEK IMMEDIATE MEDICAL CARE IF:   You develop a temperature of 102 F (38.9 C) or higher.   You have bleeding from the wound area above the pubic bone or  from the vagina.   You see pus coming from the incisions.   You cannot urinate.   You develop bloody or painful urination.   You pass out.   You develop leg or chest pain.   You develop abdominal pain.   You develop shortness of breath.  Document Released: 09/22/2008 Document Revised: 06/15/2011 Document Reviewed: 09/22/2008 [facility] Patient Information 2012 [place], [place].

## 2011-09-12 NOTE — Consult Note (Signed)
NAMEALOIS, Lindsay Walsh              ACCOUNT NO.:  [id number]  MEDICAL RECORD NO.:  [id number]  LOCATION:                                FACILITY:  [facility]  PHYSICIAN:  Ky Barban, M.D.DATE OF BIRTH:  [birth year]  DATE OF CONSULTATION: DATE OF DISCHARGE:                                CONSULTATION   CHIEF COMPLAINT:  Stress urinary incontinence.  A 41 year old female says that she has to strain to void but she also leaks urine when she coughs or sneezes.  No urgency or urge incontinence.  She underwent complete workup in the office.  Cystoscopy shows that she has marked stress incontinence which usually goes away. She was asked to stand up to do the stress test.  She leaked lot of urine when she coughed in standing position and I have told her that she can benefit from tension-free vaginal tape.  I made sure that she understands that one of the biggest complication is urinary retention. If that happens, I have to go back and remove the tape and when I remove the tape, then she can continue to have stress incontinence.  She understands, wanted me to go ahead and do the procedure.  I also told her that if she has any other thing that she might think that will get better with the operation and this operation is only going to help her with stress incontinence.  It is not going to help out of the way she voids.  PAST MEDICAL HISTORY:  She had liver biopsy done x3.  Has hypertension, takes medicine, no diabetes.  She had cardiac arrhythmia. Cholecystectomy in 2011, hysterectomy and oophorectomy in 2002.  She has chronic autoimmune hepatitis.  She is on low-dose of azathioprine.  She also has chronic GERD and irritable bowel syndrome.  PERSONAL HISTORY:  She smokes 1-1/2 pack a day.  Does not drink.  REVIEW OF SYSTEMS:  Unremarkable.  PHYSICAL EXAMINATION:  VITAL SIGNS:  Blood pressure 152/93, temperature 98.7.  Urinalysis is normal.  CENTRAL NERVOUS SYSTEM:  No  gross neurological deficit. HEAD, NECK, EYES, ENT:  Negative. CHEST:  Symmetrical. HEART:  Regular sinus rhythm. ABDOMEN:  Soft, flat.  Liver, spleen, kidneys not palpable. PELVIC:  No adnexal mass or tenderness.  IMPRESSION: 1. Urinary stress incontinence. 2. Chronic autoimmune liver disease. 3. Hypertension. 4. Irritable bowel syndrome.  PLAN:  Tension-free vaginal tape under anesthesia as outpatient.     Ky Barban, M.D.     MIJ/MEDQ  D:  09/11/2011  T:  09/12/2011  Job:  [id number]

## 2011-09-12 NOTE — Progress Notes (Signed)
Dr Jerre Simon notified of pt's requesting more pain med at home.

## 2011-09-12 NOTE — Transfer of Care (Signed)
Immediate Anesthesia Transfer of Care Note  Patient: Lindsay Walsh  Procedure(s) Performed: Procedure(s) (LRB): TRANSVAGINAL TAPE (TVT) PROCEDURE (N/A)  Patient Location: PACU  Anesthesia Type: General  Level of Consciousness: awake, alert  and oriented  Airway & Oxygen Therapy: Patient Spontanous Breathing and Patient connected to face mask oxygen  Post-op Assessment: Report given to PACU RN  Post vital signs: Reviewed and stable  Complications: No apparent anesthesia complications

## 2011-09-13 MED ORDER — OXYCODONE-ACETAMINOPHEN 5-325 MG PO TABS: 1.5000 | ORAL_TABLET | Freq: Four times a day (QID) | ORAL | Status: AC | PRN

## 2011-09-13 MED ORDER — CIPROFLOXACIN HCL 250 MG PO TABS: 250.0000 mg | ORAL_TABLET | Freq: Two times a day (BID) | ORAL | Status: AC

## 2011-09-13 NOTE — Progress Notes (Signed)
UR Chart Review Completed  

## 2011-09-13 NOTE — Plan of Care (Signed)
Problem: Phase III Progression Outcomes Goal: Voiding independently Outcome: Not Met (add Reason) Going home with foley in place

## 2011-09-13 NOTE — Consult Note (Signed)
NAMEJANARA, Lindsay Walsh NO.:  [id number]  MEDICAL RECORD NO.:  [id number]  LOCATION:                                 FACILITY:  PHYSICIAN:  Ky Barban, M.D.DATE OF BIRTH:  [birth year]  DATE OF CONSULTATION: DATE OF DISCHARGE:                                CONSULTATION   A 41 year old female.  I have done tension-free vaginal tape today, and she was unable to void in the recovery room, so I put a Foley catheter. I am going to keep her overnight in the hospital for observation. Tomorrow morning, I will send her home with Foley catheter, which I will take it out in the office.  She has no other significant medical problem going on right now.  There is no change in her physical exam today.  No complication during this surgery.     Ky Barban, M.D.     MIJ/MEDQ  D:  09/12/2011  T:  09/13/2011  Job:  [id number]

## 2011-09-13 NOTE — Progress Notes (Signed)
Patient discharged home with family.  Instructed on new medications and how to take them.  Foley bag changed to leg bag, to go home with catheter in place per MD.  Will follow up with MD i office on Monday.  Instructed on how to care for and empty catheter bag.  Pt verbalizes/demonstrates understanding of discharge instructions, no questions at this time

## 2011-09-13 NOTE — Plan of Care (Signed)
Problem: Phase I Progression Outcomes Goal: Voiding-avoid urinary catheter unless indicated Outcome: Adequate for Discharge Patient to be discharged with foley in place, and follow up outpatient with Dr. Jerre Simon

## 2011-09-13 NOTE — Op Note (Signed)
NAMESTESHA, NEYENS NO.:  [id number]  MEDICAL RECORD NO.:  [id number]  LOCATION:  A326                          FACILITY:  [facility]  PHYSICIAN:  Ky Barban, M.D.DATE OF BIRTH:  [birth year]  DATE OF PROCEDURE: DATE OF DISCHARGE:                              OPERATIVE REPORT   SURGEON:  Ky Barban, MD  PREOPERATIVE DIAGNOSIS:  Stress urinary incontinence.  POSTOPERATIVE DIAGNOSIS:  Stress urinary incontinence.  PROCEDURE:  Tension-free vaginal tape.  ANESTHESIA:  General.  PROCEDURE:  The patient under general endotracheal anesthesia in lithotomy position.  After usual prep and drape, #20 Foley catheter was inserted into the bladder and vaginal speculum Sims-type was introduced. Bladder neck was identified and I already had marked the suprapubic area on each side of the midline at the level of the pubic tubercle.  After identifying the bladder neck, Foley catheter was removed and I introduced the Foley catheter with a stylet in it to deflect the bladder neck to the right side and incision over the midurethra was made about 1 cm long.  Once the incision was made, then I grabbed the edges of the vaginal mucosa and on each side, I developed the flap in a way that it can accommodate tip of the needle on each side.  First, on the left side, I introduced the TVT needle and hugging the backside of the pubic symphysis came out at the previously marked site in the suprapubic area. At this point, the tape was attached to the needle and the green cable was pulled up in the suprapubic area and the bladder was inspected with right angle lens.  I filled up the bladder with 300 mL of saline to make sure the tape is outside the bladder, which it is.  Similarly, the right limb of the tape was introduced, and again I looked into the bladder to make sure the tape is outside the bladder, which it is.  Once the tape was properly placed, #20 Foley catheter was  introduced into the bladder and I introduced curved Mayo scissor between the tape and the Foley catheter to adjust the tension on the tape.  The plastic tube around the tape was removed first on the left then on the right side making sure when the plastic tubing was being pulled out, the tape does not get tight.  So it is left slightly lose on sitting on top of the urethra. The wound was irrigated and the vaginal wound was then closed with interrupted sutures of 3-0 Vicryl.  Once again, I looked into the bladder at the end with right angle lens after filling it up with 300 mL of saline to make sure the tape is outside the bladder.  In the suprapubic area, the tape was cut, flushed with the skin.  Then using the Dermabond, the suprapubic skin incisions were simply approximated.  The vaginal speculum and the lap in the vagina was removed.  The patient was stable, left the operating room in satisfactory condition.     Ky Barban, M.D.     MIJ/MEDQ  D:  09/12/2011  T:  09/13/2011  Job:  [id number]

## 2011-09-15 ENCOUNTER — Encounter (HOSPITAL_COMMUNITY): Payer: Self-pay | Admitting: Urology

## 2011-09-20 NOTE — Discharge Summary (Signed)
[phone]

## 2011-09-21 ENCOUNTER — Ambulatory Visit (HOSPITAL_COMMUNITY): Payer: PRIVATE HEALTH INSURANCE | Admitting: Psychiatry

## 2011-09-21 NOTE — Discharge Summary (Signed)
Lindsay Walsh, VANZEE              ACCOUNT NO.:  [id number]  MEDICAL RECORD NO.:  [id number]  LOCATION:  A326                          FACILITY:  [facility]  PHYSICIAN:  Ky Barban, M.D.DATE OF BIRTH:  12-19-1970  DATE OF ADMISSION:  09/12/2011 DATE OF DISCHARGE:  03/06/2013LH                              DISCHARGE SUMMARY   A 41 year old female with longstanding history of stress urinary incontinence.  Workup showed that she can benefit from tension-free vaginal tape and after the workup, she was advised that she agree to it, so she was brought as outpatient.  After having routine admission workup, CBC, BMET, EKG, chest x-ray was done which was normal.  She also suffers from depression and takes several different medicines.  I have told her that if she goes into retention, I may have to remove the tape. She understood.  She was taken to the operating room.  TVT was done. Postoperatively she was unable to void, so I kept her in the hospital overnight.  Next day voiding trial failed again, so I decided to send her home with Foley catheter.  FINAL DISCHARGE DIAGNOSES: 1. Stress urinary incontinence. 2. Postop urinary retention.  PLAN:  I will remove the Foley catheter on Monday in the office, give her voiding trial.  Final discharge medicine, she is advised to continue her usual medicines for depression.     Ky Barban, M.D.     MIJ/MEDQ  D:  09/20/2011  T:  09/21/2011  Job:  [id number]

## 2011-09-26 ENCOUNTER — Ambulatory Visit (INDEPENDENT_AMBULATORY_CARE_PROVIDER_SITE_OTHER): Payer: PRIVATE HEALTH INSURANCE | Admitting: Psychiatry

## 2011-09-26 ENCOUNTER — Encounter (HOSPITAL_COMMUNITY): Payer: Self-pay | Admitting: Psychiatry

## 2011-09-26 VITALS — Wt 181.6 lb

## 2011-09-26 DIAGNOSIS — F329 Major depressive disorder, single episode, unspecified: Secondary | ICD-10-CM

## 2011-09-26 MED ORDER — AMPHETAMINE-DEXTROAMPHETAMINE 15 MG PO TABS: 15.0000 mg | ORAL_TABLET | Freq: Every day | ORAL | Status: DC
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
## 2011-09-29 ENCOUNTER — Ambulatory Visit (INDEPENDENT_AMBULATORY_CARE_PROVIDER_SITE_OTHER): Payer: PRIVATE HEALTH INSURANCE | Admitting: Psychiatry

## 2011-09-29 DIAGNOSIS — F331 Major depressive disorder, recurrent, moderate: Secondary | ICD-10-CM

## 2011-09-29 NOTE — Patient Instructions (Signed)
Chart pain level

## 2011-10-02 NOTE — Progress Notes (Signed)
Patient:  Lindsay Walsh   DOB: 09-06-1970  MR Number: [id number]  [facility]:  [address] [place],  [place], [address]  Start: Friday 09/29/2011 3:20 PM End: Friday 09/29/2011 3:55 PM  Provider/Observer:     Florencia Reasons, MSW, LCSW   Chief Complaint:      Chief Complaint  Patient presents with  . Depression    Reason For Service:     The patient is resuming services as she is experiencing increased symptoms of depression. She has a long history of symptoms of depression that have been exacerbated in the past several months due to to problems with housing, the death of her pet, and unresolved issues related to her boyfriend's death in Dec 31, 2007.  Interventions Strategy:  Supportive therapy, cognitive behavioral therapy  Participation Level:   Active  Participation Quality:  Appropriate      Behavioral Observation:  Fairly Groomed, Alert, and Tearful.   Current Psychosocial Factors: 1. Patient recently moved,  Content of Session:   Reviewing symptoms, identifying stressors, processing feelings, identifying coping and relaxation techniques  Current Status:   The patient reports increased depressed mood, crying spells, isolative behaviors, and increased irritability.  Patient Progress:   Fair. The patient reports increased stress due to adjustment to new residence as well as increased health issues.  She expresses frustration with her medical provider who doesn't listen per patient's report. She continues to experience chronic pain despite working with the pain management clinic.   Therapist works with patient to identify ways to chart pain level/intensity and explore actlvities to do at each level.   Target Goals:   1. Improve mood and resume normal interest in activities. 2. Improve assertiveness skills and ability to set and maintain boundaries. 3 Improve coping and relaxation skills.  Last Reviewed:   01/04/2011  Goals Addressed Today:    Improve coping and  relaxation skills  Impression/Diagnosis:   The patient has a long-standing history of recurrent periods of depression with symptoms worsening in the past several months and include crying spells, loss of interest in activities, depressed mood, worry, and social withdrawal. Diagnoses: major depressive disorder, recurrent, moderate  Diagnosis:  Axis I:  1. Major depressive disorder, recurrent, moderate             Axis II: Deferred

## 2011-10-06 ENCOUNTER — Other Ambulatory Visit (INDEPENDENT_AMBULATORY_CARE_PROVIDER_SITE_OTHER): Payer: Self-pay | Admitting: Internal Medicine

## 2011-10-16 ENCOUNTER — Ambulatory Visit (HOSPITAL_COMMUNITY): Payer: PRIVATE HEALTH INSURANCE | Admitting: Psychiatry

## 2011-10-26 ENCOUNTER — Ambulatory Visit (INDEPENDENT_AMBULATORY_CARE_PROVIDER_SITE_OTHER): Payer: PRIVATE HEALTH INSURANCE | Admitting: Psychiatry

## 2011-10-26 ENCOUNTER — Encounter (HOSPITAL_COMMUNITY): Payer: Self-pay | Admitting: Psychiatry

## 2011-10-26 VITALS — Wt 183.4 lb

## 2011-10-26 DIAGNOSIS — F329 Major depressive disorder, single episode, unspecified: Secondary | ICD-10-CM

## 2011-10-26 MED ORDER — AMPHETAMINE-DEXTROAMPHETAMINE 20 MG PO TABS: 20.0000 mg | ORAL_TABLET | Freq: Every day | ORAL | Status: DC
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
## 2011-11-07 ENCOUNTER — Other Ambulatory Visit (HOSPITAL_COMMUNITY): Payer: Self-pay | Admitting: Psychiatry

## 2011-11-08 ENCOUNTER — Ambulatory Visit
Admission: RE | Admit: 2011-11-08 | Discharge: 2011-11-08 | Disposition: A | Discharge status: Home / Self Care | Payer: PRIVATE HEALTH INSURANCE | Source: Ambulatory Visit | Attending: Pulmonary Disease | Admitting: Pulmonary Disease

## 2011-11-08 DIAGNOSIS — N6320 Unspecified lump in the left breast, unspecified quadrant: Secondary | ICD-10-CM

## 2011-11-10 ENCOUNTER — Other Ambulatory Visit (INDEPENDENT_AMBULATORY_CARE_PROVIDER_SITE_OTHER): Payer: Self-pay | Admitting: Internal Medicine

## 2011-11-10 ENCOUNTER — Other Ambulatory Visit (HOSPITAL_COMMUNITY): Payer: Self-pay

## 2011-11-13 ENCOUNTER — Telehealth (HOSPITAL_COMMUNITY): Payer: Self-pay | Admitting: *Deleted

## 2011-11-13 DIAGNOSIS — F329 Major depressive disorder, single episode, unspecified: Secondary | ICD-10-CM

## 2011-11-13 MED ORDER — DULOXETINE HCL 30 MG PO CPEP: 30.0000 mg | ORAL_CAPSULE | Freq: Every day | ORAL | Status: DC

## 2011-11-13 NOTE — Telephone Encounter (Signed)
Refill authorized by Dr. Arfeen 

## 2011-11-14 ENCOUNTER — Ambulatory Visit (INDEPENDENT_AMBULATORY_CARE_PROVIDER_SITE_OTHER): Payer: PRIVATE HEALTH INSURANCE | Admitting: Psychiatry

## 2011-11-14 DIAGNOSIS — F331 Major depressive disorder, recurrent, moderate: Secondary | ICD-10-CM

## 2011-11-14 NOTE — Progress Notes (Signed)
Patient:  Lindsay Walsh   DOB: [birth year]  MR Number: [id number]  Location: [facility]:  [address] [place],  [place], [address]  Start: Tuesday 11/14/2011 1:15 PM End: Tuesday 11/14/2011 1:50 PM  Provider/Observer:     Florencia Reasons, MSW, LCSW   Chief Complaint:      Chief Complaint  Patient presents with  . Depression    Reason For Service:     The patient is resuming services as she is experiencing increased symptoms of depression. She has a long history of symptoms of depression that have been exacerbated in the past several months due to to problems with housing, the death of her pet, and unresolved issues related to her boyfriend's death in 12/27/2007.  Interventions Strategy:  Supportive therapy, cognitive behavioral therapy  Participation Level:   Active  Participation Quality:  Appropriate      Behavioral Observation:  Fairly Groomed, Alert, and Tearful.   Current Psychosocial Factors: 1. Patient continues to have chronic health issues  Content of Session:   Reviewing symptoms, processing feelings, identifying and challenging common thinking errors, identifying ways to increase light exposure  Current Status:   The patient reports increased depressed mood, crying spells, and increased irritability.  Patient Progress:   Fair. The patient reports increased depressed mood due to ongoing health issues and recent medical tests.  She reports recent visit to the doctor alone to obtain medical results triggered memories of her deceased fiancs' support regarding her medical issues in the past. She states feeling alone and that people In her life are not supportive. Patient admits that she does not ask for assistance. Therapist works with patient to identify and challenge common thinking errors as well as identify the effects of thinking patterns on patient's mood and behavior. Patient also admits being more depressed since we've had several days of rainy and cloudy weather.  She also reports being without her antidepressant for a week due to problems with medication refill that now  have been resolved. Therapist works with patient to identify ways to increase light exposure and to review coping and relaxation techniques.  Target Goals:   1. Improve mood and resume normal interest in activities. 2. Improve assertiveness skills and ability to set and maintain boundaries. 3 Improve coping and relaxation skills.  Last Reviewed:   01/04/2011  Goals Addressed Today:    Improve coping and relaxation skills, improve assertiveness skills  Impression/Diagnosis:   The patient has a long-standing history of recurrent periods of depression with symptoms worsening in the past several months and include crying spells, loss of interest in activities, depressed mood, worry, and social withdrawal. Diagnoses: major depressive disorder, recurrent, moderate  Diagnosis:  Axis I:  1. Major depressive disorder, recurrent, moderate             Axis II: Deferred

## 2011-11-14 NOTE — Patient Instructions (Signed)
Discussed orally 

## 2011-11-21 ENCOUNTER — Ambulatory Visit (HOSPITAL_COMMUNITY): Payer: Self-pay | Admitting: Psychiatry

## 2011-11-28 ENCOUNTER — Ambulatory Visit (HOSPITAL_COMMUNITY)
Admission: RE | Admit: 2011-11-28 | Discharge: 2011-11-28 | Disposition: A | Discharge status: Home / Self Care | Payer: PRIVATE HEALTH INSURANCE | Source: Ambulatory Visit | Attending: Neurology | Admitting: Neurology

## 2011-11-28 ENCOUNTER — Encounter (HOSPITAL_COMMUNITY): Payer: Self-pay | Admitting: Psychiatry

## 2011-11-28 ENCOUNTER — Other Ambulatory Visit: Payer: Self-pay | Admitting: Neurology

## 2011-11-28 ENCOUNTER — Ambulatory Visit (INDEPENDENT_AMBULATORY_CARE_PROVIDER_SITE_OTHER): Payer: PRIVATE HEALTH INSURANCE | Admitting: Psychiatry

## 2011-11-28 VITALS — Wt 184.0 lb

## 2011-11-28 DIAGNOSIS — M545 Low back pain, unspecified: Secondary | ICD-10-CM | POA: Insufficient documentation

## 2011-11-28 DIAGNOSIS — M79606 Pain in leg, unspecified: Secondary | ICD-10-CM

## 2011-11-28 DIAGNOSIS — F329 Major depressive disorder, single episode, unspecified: Secondary | ICD-10-CM

## 2011-11-28 DIAGNOSIS — M79609 Pain in unspecified limb: Secondary | ICD-10-CM | POA: Insufficient documentation

## 2011-11-28 DIAGNOSIS — M25559 Pain in unspecified hip: Secondary | ICD-10-CM | POA: Insufficient documentation

## 2011-11-28 MED ORDER — AMPHETAMINE-DEXTROAMPHETAMINE 20 MG PO TABS: 20.0000 mg | ORAL_TABLET | Freq: Every day | ORAL | Status: DC

## 2011-11-28 MED ORDER — DULOXETINE HCL 60 MG PO CPEP: 60.0000 mg | ORAL_CAPSULE | Freq: Every day | ORAL | Status: DC
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
## 2011-12-19 ENCOUNTER — Ambulatory Visit (INDEPENDENT_AMBULATORY_CARE_PROVIDER_SITE_OTHER): Payer: PRIVATE HEALTH INSURANCE | Admitting: Psychiatry

## 2011-12-19 DIAGNOSIS — F331 Major depressive disorder, recurrent, moderate: Secondary | ICD-10-CM

## 2011-12-19 NOTE — Patient Instructions (Addendum)
Discussed orally 

## 2011-12-20 ENCOUNTER — Other Ambulatory Visit (HOSPITAL_COMMUNITY): Payer: Self-pay | Admitting: Pulmonary Disease

## 2011-12-20 DIAGNOSIS — R29890 Loss of height: Secondary | ICD-10-CM

## 2011-12-20 NOTE — Progress Notes (Signed)
Patient:  Lindsay Walsh   DOB: [birth year]  MR Number: [id number]  Location: [facility]:  [address] [place],  [place], [address]  Start: Tuesday 12/19/2011 3:10 PM End: Tuesday 12/19/2011 3:55 PM  Provider/Observer:     Florencia Reasons, MSW, LCSW   Chief Complaint:      Chief Complaint  Patient presents with  . Depression  . Anxiety    Reason For Service:     The patient is resuming services as she is experiencing increased symptoms of depression. She has a long history of symptoms of depression that have been exacerbated in the past several months due to to problems with housing, the death of her pet, and unresolved issues related to her boyfriend's death in 2007/12/11.  Interventions Strategy:  Supportive therapy, cognitive behavioral therapy  Participation Level:   Active  Participation Quality:  Appropriate      Behavioral Observation:  Fairly Groomed, Alert, and Tearful.   Current Psychosocial Factors: 1. Patient continues to have chronic health issues  Content of Session:   Reviewing symptoms, processing feelings, identifying and challenging common thinking errors, identifying ways to improve assertiveness skills  Current Status:   The patient reports continued depressed mood, crying spells, and increased irritability.  Patient Progress:   Fair. The patient reports continued depressed mood due to ongoing health issues and recent medical tests.  She reports seeing a doctor and a psychologist at [facility] yesterday and being upset by their demeanor. Patient states feeling as though the doctors don't really care about her condition and tend to rush through their visits. She also reports thinking that the doctors have negative impressions about patient. Therapist works with patient to identify and challenge thinking patterns. Therapist also works with patient to identify ways to express her concerns to her physician's including developing a list of questions as well as  identifying statements to possibly use. Patient also expresses frustration regarding continued chronic pain. She reports forgetting to use relaxation techniques discussed in session but plans to begin using the techniques including the meditation. She has been trying to use distracting activities such as walking but reports this provides little relief.  Target Goals:   1. Improve mood and resume normal interest in activities. 2. Improve assertiveness skills and ability to set and maintain boundaries. 3 Improve coping and relaxation skills.  Last Reviewed:   01/04/2011  Goals Addressed Today:    Improve coping and relaxation skills, improve assertiveness skills  Impression/Diagnosis:   The patient has a long-standing history of recurrent periods of depression with symptoms worsening in the past several months and include crying spells, loss of interest in activities, depressed mood, worry, and social withdrawal. Diagnoses: major depressive disorder, recurrent, moderate  Diagnosis:  Axis I:  1. Major depressive disorder, recurrent, moderate             Axis II: Deferred

## 2011-12-22 ENCOUNTER — Telehealth (HOSPITAL_COMMUNITY): Payer: Self-pay | Admitting: Dietician

## 2011-12-22 NOTE — Telephone Encounter (Signed)
Received referral from Dr. Juanetta Gosling via fax on 12/21/11 for dx: hyperglycemia.

## 2011-12-25 NOTE — Telephone Encounter (Signed)
Appointment scheduled for 12/27/11 at 10:00 AM. Mailed appointment confirmation letter and instructions to pt home via [place] [employer].

## 2011-12-27 ENCOUNTER — Encounter (HOSPITAL_COMMUNITY): Payer: Self-pay | Admitting: Dietician

## 2011-12-27 NOTE — Progress Notes (Signed)
Outpatient Initial Nutrition Assessment  Date:12/27/2011   Time: 10:00 AM  Referring Physician: Dr. Juanetta Gosling Reason for Visit: hyperglycemia (prediabetes), obesity  Nutrition Assessment:  Height: 4' 11.5" (151.1 cm)   Weight: 179 lb (81.194 kg)   IBW: 99# %IBW: 181% UBW: 120# %UBW: 149% Body mass index is 35.55 kg/(m^2).  Goal Weight: 140-145# (per pt) Weight hx: Pr reports her lowest adult weight was 105#, around 15-20 years ago. She reports UBW of of 120-125# for the majority of her adult life. Her highest weight was 200#, about 1 year ago. She has been actively trying to lose weight, as she is concerned about developing diabetes and to help with her back pain.  Estimated nutritional needs: 1673-1825 kcals daily, 65-81 grams protein daily, 1.7-1.8 L fluid daily  PMH:  Past Medical History  Diagnosis Date  . Gastroesophageal reflux disease     Chronic abdominal pain; gastroparesis; globus hystericus; irritable bowel syndrome  . Hypertension   . Depression   . Fibromyalgia   . Chest pain     Normal stress echo in 2011; PVCs; pedal edema  . Tobacco abuse     one pack per day; 35 pack years  . Overweight   . Narcotic dependence   . Pulmonary nodule   . Fasting hyperglycemia   . Shortness of breath     with exertion  . Sleep apnea   . Autoimmune hepatitis   . Irritable bowel syndrome   . Arthritis     back  . Angina     took SL nitro one week ago  . Dysrhythmia     palpatations    Medications:  Current Outpatient Rx  Name Route Sig Dispense Refill  . AMPHETAMINE-DEXTROAMPHETAMINE 20 MG PO TABS Oral Take 1 tablet (20 mg total) by mouth daily. 30 tablet 0  . VITAMIN C 1000 MG PO TABS Oral Take 1,000 mg by mouth daily.    . AZATHIOPRINE 50 MG PO TABS      . CALCIUM CARBONATE-VITAMIN D 500-200 MG-UNIT PO TABS Oral Take 1 tablet by mouth 2 (two) times daily.     . CYANOCOBALAMIN 2000 MCG PO TABS Oral Take 2,000 mcg by mouth daily.    Marland Kitchen DICYCLOMINE HCL 10 MG PO CAPS Oral  Take 10 mg by mouth 3 (three) times daily as needed. For cramps    . DULOXETINE HCL 60 MG PO CPEP Oral Take 1 capsule (60 mg total) by mouth daily. 30 capsule 0  . ESTRADIOL 0.075 MG/24HR TD PTWK Transdermal Place 1 patch onto the skin once a week.      Marland Kitchen FEXOFENADINE HCL 60 MG PO TABS Oral Take 60 mg by mouth daily.      . FUROSEMIDE 40 MG PO TABS Oral Take 40 mg by mouth daily.     Marland Kitchen GARLIC PO Oral Take 1,000 mg by mouth daily.     Marland Kitchen LEVETIRACETAM 750 MG PO TABS Oral Take 750 mg by mouth at bedtime.     Marland Kitchen LIDOCAINE 5 % EX PTCH Transdermal Place 1 patch onto the skin daily. Remove & Discard patch within 12 hours or as directed by MD    . LISINOPRIL 10 MG PO TABS Oral Take 10 mg by mouth daily.      Marland Kitchen METHADONE HCL 5 MG PO TABS Oral Take 5 mg by mouth 4 (four) times daily.    Marland Kitchen METHOCARBAMOL 500 MG PO TABS Oral Take 500 mg by mouth 3 (three) times daily as needed. For  pain    . METOPROLOL SUCCINATE ER 50 MG PO TB24      . CENTRUM PO CHEW Oral Chew 1 tablet by mouth daily.      Marland Kitchen NEXIUM 40 MG PO CPDR  take 1 capsule by mouth every morning 30 capsule 11  . NITROSTAT 0.4 MG SL SUBL  place 1 tablet under the tongue if needed every 5 minutes for chest pain for 3 doses IF NO RELIEF AFTER 3RD DOSE CALL PRESCRIBER OR 911. 25 tablet 3  . PRAMIPEXOLE DIHYDROCHLORIDE 0.25 MG PO TABS Oral Take 0.25 mg by mouth as needed. For restless legs    . ALIGN PO Oral Take 1 capsule by mouth daily.       Labs: CMP     Component Value Date/Time   NA 140 09/11/2011 1635   K 4.6 09/11/2011 1635   CL 102 09/11/2011 1635   CO2 31 09/11/2011 1635   GLUCOSE 94 09/11/2011 1635   BUN 6 09/11/2011 1635   CREATININE 0.59 09/11/2011 1635   CREATININE 0.64 08/01/2011 1522   CALCIUM 9.7 09/11/2011 1635   PROT 7.3 08/01/2011 1522   ALBUMIN 4.5 08/01/2011 1522   AST 26 08/01/2011 1522   ALT 22 08/01/2011 1522   ALKPHOS 100 08/01/2011 1522   BILITOT 0.3 08/01/2011 1522   GFRNONAA >90 09/11/2011 1635   GFRAA >90 09/11/2011 1635    Lipid Panel       Component Value Date/Time   CHOL 178 03/04/2008   HDL 40 03/04/2008   LDLCALC 108t 03/04/2008     No results found for this basename: HGBA1C   Lab Results  Component Value Date   LDLCALC 108t 03/04/2008   CREATININE 0.59 09/11/2011    Per Dr. Juanetta Gosling records, on 12/20/11, estimate average blood glucose: 126, Hgb A1c: 6.0. Prior A1c: 5.8 on 09/26/11.   Lifestyle/ social habits: Ms. Pua is a very pleasant lady who lives in [place] with her two sons, ages 71 and 26. Her youngest son accompanied her today. He mother and father lives nearby and are very supportive. She is divorced. She has been disabled since 2004. Prior to disability, she worked as a Electrical engineer. She smokes 1.5 PPD and is actively trying to quit; she has quit before. She denies alcohol or drug use. She reports her stress level at 9/10, citing health and finances as her main stressors.   Nutrition hx/habits: Ms. Marshburn reports that within the past 6 months she has lost her appetite and has not eaten as much as she normally does. She is unsure if her appetite changes are related to illness. She reports that she "craves something sweet everyday" and this is not normal for her. She reports that Dr. Dahlia Bailiff told her she has a "slow digestive system" and told her not to eat many wheat products. She also reports that she cannot eat salt substitutes or sugar substitutes due to her liver medications (however, pt reports eating sugar free candy, but will not drink diet sodas or use artificial sweeteners in her coffee and tea). She is trying to reduce the amount of sugar she uses in her tea (she currently uses 1.5 cups of sugar per gallon of tea and 1 tbsp of sugar in her coffee). She does not eat lunch because she does not feel hungry. She reports she does not check her blood sugars as instructed because her insurance company does not pay for the test strips. She currently has a [employer], but stopped going  4-8 months ago.  Diet  recall: 11:00 AM: 12 oz can Dr. Reino Kent; 11:30 AM: bowl of cereal (Fruity Pebbles or [employer]) with whole or 2% milk; 12:00 PM: 2 pieces of [employer] cinnamon toast with Country Crock Honey Butter; Snack: pack of nabs or 1/2 snadwich; 9-10 PM: whole sandwich OR spaghetti OR baked chicken OR hot dogs. She drinks mostly coffee, Dr. Reino Kent, and tea.  Nutrition Diagnosis: Inconsistent carbohydrate intake r/t disordered eating pattern, diet high in refined sugars AEB Hgb A1c: 6.0.  Nutrition Intervention: Nutrition rx: 1200 kcal NAS, no added sugar diet; 3 meals per day; low calorie beverages only; 30 minutes physical activity 5 times per week  Education/Counseling Provided: Educated pt on diabetic diet principles. Emphasized plate method, portion sizes, and sources of carbohydrate. Discussed healthy food preparation methods. Discussed nutritional content in commonly eaten foods and discussed alternatives. Educated pt on food label reading. Educated pt on importance of physical activity along with a healthy diet to optimize glycemic and weight goals. Encouraged 0.5-2# weight loss per week or 7-10% weight loss. Showed pt functionality of MyFitnessPal app. Provided plate method handout and handouts from the Academy of Nutrition and Dietetics Nutrition Care Manual re: weight management.   Understanding, Motivation, Ability to Follow Recommendations: Expect fair to good compliance.  Monitoring and Evaluation: Goals: 1) 1-2# weight loss per week; 2) 30 minutes physical activity 5 times per week; 3) Hgb A1c < 6.0  Recommendations: 1) For weight loss: 1173-1325 kcals daily; 2) Break up exercise into smaller, more frequent sessions; 3) Keep food diary (ex. myFitnessPal)  F/U: PRN. Pt did not have calendar on hand. She reports she will call back to schedule follow-up appointment.   Orlene Plum, RD  12/27/2011  Time: 10:00 AM

## 2011-12-28 ENCOUNTER — Ambulatory Visit (INDEPENDENT_AMBULATORY_CARE_PROVIDER_SITE_OTHER): Payer: PRIVATE HEALTH INSURANCE | Admitting: Psychiatry

## 2011-12-28 ENCOUNTER — Telehealth (HOSPITAL_COMMUNITY): Payer: Self-pay | Admitting: Dietician

## 2011-12-28 ENCOUNTER — Ambulatory Visit (HOSPITAL_COMMUNITY)
Admission: RE | Admit: 2011-12-28 | Discharge: 2011-12-28 | Disposition: A | Discharge status: Home / Self Care | Payer: PRIVATE HEALTH INSURANCE | Source: Ambulatory Visit | Attending: Pulmonary Disease | Admitting: Pulmonary Disease

## 2011-12-28 ENCOUNTER — Encounter (HOSPITAL_COMMUNITY): Payer: Self-pay | Admitting: Psychiatry

## 2011-12-28 VITALS — Wt 181.2 lb

## 2011-12-28 DIAGNOSIS — R29890 Loss of height: Secondary | ICD-10-CM | POA: Insufficient documentation

## 2011-12-28 DIAGNOSIS — F17200 Nicotine dependence, unspecified, uncomplicated: Secondary | ICD-10-CM | POA: Insufficient documentation

## 2011-12-28 DIAGNOSIS — F329 Major depressive disorder, single episode, unspecified: Secondary | ICD-10-CM

## 2011-12-28 DIAGNOSIS — Z780 Asymptomatic menopausal state: Secondary | ICD-10-CM | POA: Insufficient documentation

## 2011-12-28 LAB — HM DEXA SCAN: HM Dexa Scan: NORMAL

## 2011-12-28 MED ORDER — AMPHETAMINE-DEXTROAMPHETAMINE 20 MG PO TABS: 20.0000 mg | ORAL_TABLET | Freq: Every day | ORAL | Status: DC

## 2011-12-28 MED ORDER — DULOXETINE HCL 60 MG PO CPEP: 60.0000 mg | ORAL_CAPSULE | Freq: Every day | ORAL | Status: DC
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
## 2012-01-01 ENCOUNTER — Ambulatory Visit (HOSPITAL_COMMUNITY): Payer: Self-pay | Admitting: Psychiatry

## 2012-01-04 ENCOUNTER — Ambulatory Visit (INDEPENDENT_AMBULATORY_CARE_PROVIDER_SITE_OTHER): Payer: PRIVATE HEALTH INSURANCE | Admitting: Psychiatry

## 2012-01-04 DIAGNOSIS — F329 Major depressive disorder, single episode, unspecified: Secondary | ICD-10-CM

## 2012-01-04 NOTE — Patient Instructions (Signed)
Discussed orally 

## 2012-01-04 NOTE — Telephone Encounter (Signed)
Pt has not returned call. Referral filed.

## 2012-01-04 NOTE — Progress Notes (Signed)
Patient:  Lindsay Walsh   DOB: [birth year]  MR Number: [id number]  Location: Behavioral Health Center:  [address] [place],  [place], [address]  Start: Thursday 01/04/2012 3:05 PM End: Thursday 01/04/2012 3:55 PM  Provider/Observer:     Florencia Reasons, MSW, LCSW   Chief Complaint:      Chief Complaint  Patient presents with  . Depression    Reason For Service:     The patient is resuming services as she is experiencing increased symptoms of depression. She has a long history of symptoms of depression that have been exacerbated in the past several months due to to problems with housing, the death of her pet, and unresolved issues related to her boyfriend's death in 01/02/08.  Interventions Strategy:  Supportive therapy, cognitive behavioral therapy  Participation Level:   Active  Participation Quality:  Appropriate      Behavioral Observation:  Fairly Groomed, Alert, and Pleasant   Current Psychosocial Factors: 1. Patient continues to have chronic health issues  Content of Session:   Reviewing symptoms, processing feelings, identifying community resources, identifying and challenging cognitive distortions  Current Status:   The patient reports continued improved mood, decreased crying spells, and decreased irritability but continued anxiety.  Patient Progress:   Good. The patient reports recently going to [facility] for an epidural injection and working with the psychologist at the pain clinic. Patient reports feeling better as she had positive interaction with her doctors at her last visit. Therapist works with patient to identify her thinking patterns and effects on her mood and behavior regarding her relationship with her doctors. Patient is optimistic about getting help regarding pain management and states commitment to completing the homework assignments her doctors. Patient reports increased concern about her son who has Aarskog Syndrome. She states it is stressful having an adult son  with special needs and is concerned about trying to obtain help for her son. She is in the process of applying for disability for her son. Therapist also works with patient to identify other community resources. This also works with patient to review relaxation and coping techniques.  Target Goals:   1. Improve mood and resume normal interest in activities. 2. Improve assertiveness skills and ability to set and maintain boundaries. 3 Improve coping and relaxation skills.  Last Reviewed:   01/04/2011  Goals Addressed Today:    Improve coping and relaxation skills, improve assertiveness skills  Impression/Diagnosis:   The patient has a long-standing history of recurrent periods of depression with symptoms worsening in the past several months and include crying spells, loss of interest in activities, depressed mood, worry, and social withdrawal. Diagnoses: major depressive disorder, recurrent, moderate  Diagnosis:  Axis I:  1. Major depressive disorder             Axis II: Deferred

## 2012-01-08 HISTORY — PX: LUMBAR EPIDURAL INJECTION: SHX1980

## 2012-01-18 ENCOUNTER — Ambulatory Visit (INDEPENDENT_AMBULATORY_CARE_PROVIDER_SITE_OTHER): Payer: PRIVATE HEALTH INSURANCE | Admitting: Psychiatry

## 2012-01-18 DIAGNOSIS — F329 Major depressive disorder, single episode, unspecified: Secondary | ICD-10-CM

## 2012-01-18 NOTE — Progress Notes (Signed)
Patient:  Lindsay Walsh   DOB: 09-17-1970  MR Number: [id number]  Location: Behavioral Health Center:  [address] [place],  [place], [address]  Start: Thursday 01/18/2012 3:10 PM End: Thursday 01/18/2012 3:55 PM  Provider/Observer:     Florencia Reasons, MSW, LCSW   Chief Complaint:      Chief Complaint  Patient presents with  . Depression    Reason For Service:     The patient is resuming services as she is experiencing increased symptoms of depression. She has a long history of symptoms of depression that have been exacerbated in the past several months due to to problems with housing, the death of her pet, and unresolved issues related to her boyfriend's death in 12/19/07. The patient is seen today for a followup appointment.  Interventions Strategy:  Supportive therapy, cognitive behavioral therapy  Participation Level:   Active  Participation Quality:  Appropriate      Behavioral Observation:  Fairly Groomed, Alert, and depressed  Current Psychosocial Factors: 1. Patient continues to have chronic health issues Content of Session:   Reviewing symptoms, processing feelings,  Improving assertiveness skills and identify ways to set and maintain boundaries, identifying ways to improve organization and  structure   Current Status:   The patient reports increased anxiety and irritability.  Patient Progress:   Fair. The patient expresses excitement about recently going with her daughter to have an ultrasound and seeing her unborn grandchild. She also reports being more active and performing various household tasks. However, she reports feeling overwhelmed as she has been assisting other people with their  errands and accompanying people to medical appointments in addition to having to attend to her errands and medical appointments. Patient expresses frustration that other people do not seem to be sensitive to her needs and the effects of her medical conditions on her health and energy level.  Therapist works with patient to process her feelings, identify ways to improve assertiveness skills. and ways to set and maintain boundaries. Therapist also works with patient to identify her thought patterns and the effects on her mood and behavior. Therapist and patient also discuss the process of change and identify areas within patient's control. Therapist also works with patient to identify ways to develop a schedule to balance her responsibilities and self-care.   Target Goals:   1. Improve mood and resume normal interest in activities. 2. Improve assertiveness skills and ability to set and maintain boundaries. 3 Improve coping and relaxation skills.  Last Reviewed:   01/04/2011  Goals Addressed Today:    Improve coping and relaxation skills, improve assertiveness skills  Impression/Diagnosis:   The patient has a long-standing history of recurrent periods of depression with symptoms worsening in the past several months and include crying spells, loss of interest in activities, depressed mood, worry, and social withdrawal. Diagnoses: major depressive disorder, recurrent, moderate  Diagnosis:  Axis I:  1. Major depressive disorder             Axis II: Deferred

## 2012-01-18 NOTE — Patient Instructions (Signed)
Discussed orally 

## 2012-01-29 ENCOUNTER — Ambulatory Visit (INDEPENDENT_AMBULATORY_CARE_PROVIDER_SITE_OTHER): Payer: PRIVATE HEALTH INSURANCE | Admitting: Internal Medicine

## 2012-01-29 ENCOUNTER — Encounter (INDEPENDENT_AMBULATORY_CARE_PROVIDER_SITE_OTHER): Payer: Self-pay | Admitting: Internal Medicine

## 2012-01-29 VITALS — BP 130/98 | HR 80 | Temp 98.4°F | Resp 20 | Ht 60.0 in | Wt 187.2 lb

## 2012-01-29 DIAGNOSIS — R531 Weakness: Secondary | ICD-10-CM | POA: Insufficient documentation

## 2012-01-29 DIAGNOSIS — D72829 Elevated white blood cell count, unspecified: Secondary | ICD-10-CM

## 2012-01-29 DIAGNOSIS — K754 Autoimmune hepatitis: Secondary | ICD-10-CM

## 2012-01-29 DIAGNOSIS — R5383 Other fatigue: Secondary | ICD-10-CM

## 2012-01-29 LAB — BASIC METABOLIC PANEL
BUN: 5 mg/dL — ABNORMAL LOW (ref 6–23)
CO2: 31 mEq/L (ref 19–32)
Chloride: 102 mEq/L (ref 96–112)
Glucose, Bld: 73 mg/dL (ref 70–99)
Potassium: 4.3 mEq/L (ref 3.5–5.3)

## 2012-01-29 LAB — CBC WITH DIFFERENTIAL/PLATELET
HCT: 38.6 % (ref 36.0–46.0)
Hemoglobin: 13.6 g/dL (ref 12.0–15.0)
Lymphocytes Relative: 24 % (ref 12–46)
Lymphs Abs: 2.4 10*3/uL (ref 0.7–4.0)
MCHC: 35.2 g/dL (ref 30.0–36.0)
Monocytes Absolute: 0.9 10*3/uL (ref 0.1–1.0)
Monocytes Relative: 9 % (ref 3–12)
Neutro Abs: 6.9 10*3/uL (ref 1.7–7.7)

## 2012-01-29 NOTE — Patient Instructions (Signed)
Physician will contact you with results of blood work. 

## 2012-01-29 NOTE — Progress Notes (Signed)
Presenting complaint;  Extreme weakness and fatigue. History of autoimmune hepatitis.  Subjective:  Patient is 41 year old Caucasian female who has history of autoimmune hepatitis remains in remission on therapy who presents with profound weakness which started about 3 weeks ago. She called her hepatologist Dr. Iva Boop of [facility]. He had her go to the lab on 01/10/2012. While her LFTs are normal her white cell count was 13.4. She denies fever or chills. She has no energy to do anything. She does complain of dysuria ever since she had procedure done for stress incontinence 4 months ago. She has been treated with antibiotics twice. She has appointment to see Dr. Jerre Simon a urologist next week. She complains of nausea denies vomiting. She continues to complain of polyarthralgia but there's been no change in his symptoms. She says her lower back pain has improved since she had epidural 3-4 weeks ago and she is scheduled to have 2 more injections. Her appetite is fair. Her weight has been stable in the last 6 months. She is using dicyclomine on when necessary basis. Her heartburn is well controlled with Nexium. She still has pain over right costal margin but much less frequently than prior to cholecystectomy. She denies chronic cough. He is still smoking one to one and a half pack of cigarettes per day. Patient tells me that she has an appointment to see dietitian since her blood glucose levels been creeping up but she has not been told she is diabetic  Current Medications: Current Outpatient Prescriptions  Medication Sig Dispense Refill  . amphetamine-dextroamphetamine (ADDERALL, 20MG ,) 20 MG tablet Take 1 tablet (20 mg total) by mouth daily.  30 tablet  0  . Ascorbic Acid (VITAMIN C) 1000 MG tablet Take 1,000 mg by mouth daily.      Marland Kitchen azaTHIOprine (IMURAN) 50 MG tablet 50 mg daily.       . Biotin 2500 MCG CAPS Take 2,500 mcg by mouth daily.      . calcium-vitamin D (OS-CAL 500 + D) 500-200  MG-UNIT per tablet Take 1 tablet by mouth 2 (two) times daily.       . cyanocobalamin 2000 MCG tablet Take 2,000 mcg by mouth daily.      Marland Kitchen dicyclomine (BENTYL) 10 MG capsule Take 10 mg by mouth 3 (three) times daily as needed. For cramps      . DULoxetine (CYMBALTA) 60 MG capsule Take 1 capsule (60 mg total) by mouth daily.  30 capsule  1  . estradiol (CLIMARA - DOSED IN MG/24 HR) 0.075 mg/24hr Place 1 patch onto the skin once a week.        . fexofenadine (ALLEGRA) 60 MG tablet Take 60 mg by mouth daily.        . furosemide (LASIX) 40 MG tablet Take 40 mg by mouth daily.       Marland Kitchen GARLIC PO Take 3,244 mg by mouth daily.       Marland Kitchen levETIRAcetam (KEPPRA) 750 MG tablet Take 750 mg by mouth at bedtime.       . lidocaine (LIDODERM) 5 % Place 1 patch onto the skin daily. Remove & Discard patch within 12 hours or as directed by MD      . lisinopril (PRINIVIL,ZESTRIL) 10 MG tablet Take 20 mg by mouth daily.       . methadone (DOLOPHINE) 5 MG tablet Take 5 mg by mouth 4 (four) times daily.      . metoprolol succinate (TOPROL-XL) 50 MG 24 hr tablet Take 50  mg by mouth daily.       . multivitamin-iron-minerals-folic acid (CENTRUM) chewable tablet Chew 1 tablet by mouth daily.        Marland Kitchen NEXIUM 40 MG capsule take 1 capsule by mouth every morning  30 capsule  11  . NITROSTAT 0.4 MG SL tablet place 1 tablet under the tongue if needed every 5 minutes for chest pain for 3 doses IF NO RELIEF AFTER 3RD DOSE CALL PRESCRIBER OR 911.  25 tablet  3  . penicillin v potassium (VEETID) 500 MG tablet Take 500 mg by mouth 4 (four) times daily.      . pramipexole (MIRAPEX) 0.25 MG tablet Take 0.25 mg by mouth as needed. For restless legs      . Probiotic Product (ALIGN PO) Take 1 capsule by mouth daily.          Objective: Blood pressure 130/98, pulse 80, temperature 98.4 F (36.9 C), temperature source Oral, resp. rate 20, height 5' (1.524 m), weight 187 lb 3.2 oz (84.913 kg). Patient is alert and does not appear to be in  any distress. Conjunctiva is pink. Sclera is nonicteric Oropharyngeal mucosa is normal. No neck masses or thyromegaly noted. Cardiac exam with regular rhythm normal S1 and S2. No murmur or gallop noted. Lungs are clear to auscultation. Abdomen is full but soft and nontender without organomegaly or masses.  No LE edema or clubbing noted.  Labs/studies Results: Lab studies from 01/10/2012 WBC 13.4, hemoglobin 13.7, hematocrit 38.8, platelet count 308K. Total bilirubin oh 0.3, AP 94, AST 14, ALT 11, albumin 4.1.  Assessment:  #1. Weakness. Suspect this symptom is secondary to her medications but will make sure that she is not hypokalemic or hypothyroid. #2. While leukocytosis. She is afebrile and she does not appear toxic. She has dysuria and she has been treated with antibiotics by her urologist Dr. Jerre Simon. Since she has appointment with him in one week will not order UA. #3. Autoimmune hepatitis diagnosed in September 2004. She remains in remission. #4. Chronic GERD. Symptoms well controlled with therapy. #5. IBS. She is requiring dicyclomine when necessary.   Plan:  She will go to lab for CBC with differential, metabolic 7 and TSH. She will continue azathioprine at current dose which is 50 mg daily.

## 2012-02-02 ENCOUNTER — Ambulatory Visit (HOSPITAL_COMMUNITY): Payer: Self-pay | Admitting: Psychiatry

## 2012-02-20 ENCOUNTER — Encounter (INDEPENDENT_AMBULATORY_CARE_PROVIDER_SITE_OTHER): Payer: Self-pay

## 2012-02-26 ENCOUNTER — Ambulatory Visit (HOSPITAL_COMMUNITY)
Admission: RE | Admit: 2012-02-26 | Discharge: 2012-02-26 | Disposition: A | Discharge status: Home / Self Care | Payer: PRIVATE HEALTH INSURANCE | Source: Ambulatory Visit | Attending: Pulmonary Disease | Admitting: Pulmonary Disease

## 2012-02-26 ENCOUNTER — Other Ambulatory Visit (HOSPITAL_COMMUNITY): Payer: Self-pay | Admitting: Pulmonary Disease

## 2012-02-26 DIAGNOSIS — W19XXXA Unspecified fall, initial encounter: Secondary | ICD-10-CM | POA: Insufficient documentation

## 2012-02-26 DIAGNOSIS — M25579 Pain in unspecified ankle and joints of unspecified foot: Secondary | ICD-10-CM | POA: Insufficient documentation

## 2012-02-26 DIAGNOSIS — S99919A Unspecified injury of unspecified ankle, initial encounter: Secondary | ICD-10-CM | POA: Insufficient documentation

## 2012-02-26 DIAGNOSIS — R52 Pain, unspecified: Secondary | ICD-10-CM

## 2012-02-26 DIAGNOSIS — S8990XA Unspecified injury of unspecified lower leg, initial encounter: Secondary | ICD-10-CM | POA: Insufficient documentation

## 2012-02-27 ENCOUNTER — Encounter (HOSPITAL_COMMUNITY): Payer: Self-pay | Admitting: Psychiatry

## 2012-02-27 ENCOUNTER — Telehealth (HOSPITAL_COMMUNITY): Payer: Self-pay | Admitting: *Deleted

## 2012-02-27 ENCOUNTER — Ambulatory Visit (INDEPENDENT_AMBULATORY_CARE_PROVIDER_SITE_OTHER): Payer: 59 | Admitting: Psychiatry

## 2012-02-27 VITALS — BP 130/85 | HR 90 | Wt 190.0 lb

## 2012-02-27 DIAGNOSIS — F329 Major depressive disorder, single episode, unspecified: Secondary | ICD-10-CM

## 2012-02-27 MED ORDER — DULOXETINE HCL 60 MG PO CPEP: 60.0000 mg | ORAL_CAPSULE | Freq: Every day | ORAL | Status: DC

## 2012-02-27 MED ORDER — AMPHETAMINE-DEXTROAMPHETAMINE 20 MG PO TABS: 20.0000 mg | ORAL_TABLET | Freq: Every day | ORAL | Status: DC
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
## 2012-02-28 ENCOUNTER — Ambulatory Visit (HOSPITAL_COMMUNITY): Payer: Self-pay | Admitting: Psychiatry

## 2012-03-05 ENCOUNTER — Ambulatory Visit (HOSPITAL_COMMUNITY): Payer: PRIVATE HEALTH INSURANCE | Admitting: Psychiatry

## 2012-03-05 ENCOUNTER — Other Ambulatory Visit: Payer: Self-pay | Admitting: Cardiology

## 2012-03-14 ENCOUNTER — Ambulatory Visit (INDEPENDENT_AMBULATORY_CARE_PROVIDER_SITE_OTHER): Payer: PRIVATE HEALTH INSURANCE | Admitting: Orthopedic Surgery

## 2012-03-14 ENCOUNTER — Encounter: Payer: Self-pay | Admitting: Orthopedic Surgery

## 2012-03-14 ENCOUNTER — Encounter: Payer: Self-pay | Admitting: *Deleted

## 2012-03-14 ENCOUNTER — Encounter: Payer: Self-pay | Admitting: Adult Health

## 2012-03-14 ENCOUNTER — Ambulatory Visit (INDEPENDENT_AMBULATORY_CARE_PROVIDER_SITE_OTHER): Payer: PRIVATE HEALTH INSURANCE | Admitting: Adult Health

## 2012-03-14 VITALS — BP 140/90 | HR 77 | Ht 60.0 in | Wt 188.0 lb

## 2012-03-14 VITALS — BP 132/80 | Ht 60.0 in | Wt 190.0 lb

## 2012-03-14 DIAGNOSIS — I10 Essential (primary) hypertension: Secondary | ICD-10-CM

## 2012-03-14 DIAGNOSIS — R000 Tachycardia, unspecified: Secondary | ICD-10-CM

## 2012-03-14 DIAGNOSIS — S93609A Unspecified sprain of unspecified foot, initial encounter: Secondary | ICD-10-CM

## 2012-03-14 MED ORDER — METOPROLOL SUCCINATE ER 25 MG PO TB24: 75.0000 mg | ORAL_TABLET | Freq: Every day | ORAL | Status: DC

## 2012-03-14 NOTE — Progress Notes (Signed)
HPI: Lindsay Walsh is a 41 year old patient of Dr. Molly Maduro for it he follows for ongoing assessment and treatment of hypertension with history of chest pain and palpitations.. The patient has not been seen since December of 2012 and at that time was stable. She is followed by Heber Montz for fibromyalgia treatment at the pain clinic. During recent evaluation at the pain clinic the patient  At heart rhythm with an EKG revealing a rate of 160s per minute with a nonspecific T-wave abnormality noted inferiorly. The patient admits to not taking her metoprolol today that she went to the pain clinic or other daily medications prior to that visit. She comes today for reevaluation of abnormal EKG with continued complaints of frequent  palpitations. She has been seen recently by Dr. Juanetta Gosling with an increase in her lisinopril from 20 mg a day to 40 mg a day secondary to blood pressure in the 170s systolic and his office.  She states that she has been having some pressure in her chest, and GERD symptoms, and frequent belching over the last 2 days prior to this office visit. This has gone away on its own. She continues to take her PPI, Nexium 40 mg daily. She is wearing a brace on her left foot secondary to an injury of stepping in a hole at home. Other history includes depression, tobacco abuse, obesity, sleep apnea, autoimmune hepatitis (followed by [facility]-recent appointment on 03/02/2012) and hyperglycemia.  Allergies  Allergen Reactions  . Fentanyl Itching and Other (See Comments)    Heart racing, SOB  . Iohexol Other (See Comments)    Knots on body   . Ivp Dye (Iodinated Diagnostic Agents) Other (See Comments)    Knots on body  . Wellbutrin (Bupropion Hcl) Other (See Comments)    unknown  . Doxycycline Rash  . Tape Rash    Current Outpatient Prescriptions  Medication Sig Dispense Refill  . amphetamine-dextroamphetamine (ADDERALL) 20 MG tablet Take 1 tablet (20 mg total) by mouth daily.  30 tablet   0  . Ascorbic Acid (VITAMIN C) 1000 MG tablet Take 1,000 mg by mouth daily.      Marland Kitchen azaTHIOprine (IMURAN) 50 MG tablet 50 mg daily.       . Biotin 2500 MCG CAPS Take 2,500 mcg by mouth daily.      . calcium-vitamin D (OS-CAL 500 + D) 500-200 MG-UNIT per tablet Take 1 tablet by mouth 2 (two) times daily.       . clonazePAM (KLONOPIN) 0.5 MG tablet Take 0.5 mg by mouth 2 (two) times daily as needed.      . cyanocobalamin 2000 MCG tablet Take 1,500 mcg by mouth daily. Patient is taking 1500 mcg daily.      Marland Kitchen dicyclomine (BENTYL) 10 MG capsule Take 10 mg by mouth 3 (three) times daily as needed. For cramps      . DULoxetine (CYMBALTA) 60 MG capsule Take 1 capsule (60 mg total) by mouth daily.  30 capsule  1  . estradiol (CLIMARA - DOSED IN MG/24 HR) 0.075 mg/24hr Place 1 patch onto the skin once a week.        . fexofenadine (ALLEGRA) 60 MG tablet Take 180 mg by mouth daily.       . furosemide (LASIX) 40 MG tablet Take 40 mg by mouth daily.       Marland Kitchen GARLIC PO Take 1,324 mg by mouth daily.       Marland Kitchen levETIRAcetam (KEPPRA) 750 MG tablet Take 750 mg  by mouth at bedtime.       . lidocaine (LIDODERM) 5 % Place 1 patch onto the skin daily. Remove & Discard patch within 12 hours or as directed by MD      . lisinopril (PRINIVIL,ZESTRIL) 10 MG tablet Take 20 mg by mouth daily.       . methadone (DOLOPHINE) 5 MG tablet Take 5 mg by mouth 4 (four) times daily.      . methocarbamol (ROBAXIN) 500 MG tablet Take 500 mg by mouth 2 (two) times daily.      . metoprolol succinate (TOPROL-XL) 50 MG 24 hr tablet Take 50 mg by mouth daily.       . multivitamin-iron-minerals-folic acid (CENTRUM) chewable tablet Chew 1 tablet by mouth daily.        Marland Kitchen NEXIUM 40 MG capsule take 1 capsule by mouth every morning  30 capsule  11  . NITROSTAT 0.4 MG SL tablet place 1 tablet under the tongue if needed every 5 minutes for chest pain for 3 doses IF NO RELIEF AFTER 3RD DOSE CALL PRESCRIBER OR 911.  25 tablet  6  . penicillin v  potassium (VEETID) 500 MG tablet Take 500 mg by mouth 4 (four) times daily.      . pramipexole (MIRAPEX) 0.25 MG tablet Take 0.25 mg by mouth as needed. For restless legs      . Probiotic Product (ALIGN PO) Take 1 capsule by mouth daily.       Marland Kitchen zolpidem (AMBIEN CR) 6.25 MG CR tablet Take 6.25 mg by mouth at bedtime as needed.        Past Medical History  Diagnosis Date  . Gastroesophageal reflux disease     Chronic abdominal pain; gastroparesis; globus hystericus; irritable bowel syndrome  . Hypertension   . Depression   . Fibromyalgia   . Chest pain     Normal stress echo in 2011; PVCs; pedal edema  . Tobacco abuse     one pack per day; 35 pack years  . Overweight   . Narcotic dependence   . Pulmonary nodule   . Fasting hyperglycemia   . Shortness of breath     with exertion  . Sleep apnea   . Autoimmune hepatitis   . Irritable bowel syndrome   . Arthritis     back  . Angina     took SL nitro one week ago  . Dysrhythmia     palpatations    Past Surgical History  Procedure Date  . Upper gastrointestinal endoscopy 09/13/2010  . Liver biopsy     x4  . Total abdominal hysterectomy w/ bilateral salpingoophorectomy   . Cesarean section     X3  . Tubal ligation     Bilateral  . Abdominal hysterectomy   . Cholecystectomy   . Bladder suspension 09/12/2011    Procedure: TRANSVAGINAL TAPE (TVT) PROCEDURE;  Surgeon: Ky Barban, MD;  Location: [facility];  Service: Urology;  Laterality: N/A;  . Lumbar epidural injection 01/2012    OZH:YQMVHQ of systems complete and found to be negative unless listed above  PHYSICAL EXAM BP 140/90  Pulse 77  Ht 5' (1.524 m)  Wt 188 lb (85.276 kg)  BMI 36.72 kg/m2  General: Well developed, well nourished, in no acute distress obese.  Head: Eyes PERRLA, No xanthomas.   Normal cephalic and atramatic  Lungs: Clear bilaterally to auscultation and percussion. Heart: HRRR S1 S2, without MRG.  Pulses are 2+ & equal.  No carotid  bruit. No JVD.  No abdominal bruits. No femoral bruits. Abdomen: Bowel sounds are positive, abdomen soft and non-tender without masses or                  Hernia's noted. Msk:  Back normal, normal gait. Normal strength and tone for age. Pain with palpation of chest. Extremities: No clubbing, cyanosis or edema. Left foot brace.  DP +1 Neuro: Alert and oriented X 3. Psych:  Good affect, responds appropriately  EKG:SR with PVC's. Inferior ST abnormality.  ASSESSMENT AND PLAN

## 2012-03-14 NOTE — Patient Instructions (Addendum)
Your physician has requested that you have a lexiscan myoview. For further information please visit [email or web address] Please follow instruction sheet, as given.  LABS TODAY:  BMET.  Increase Metoprolol to 75 mg daily.  Your physician recommends that you schedule a follow-up appointment after stress test in approx  2 weeks with Dr. Dietrich Pates.

## 2012-03-14 NOTE — Assessment & Plan Note (Signed)
Lindsay Walsh brings with her an EKG that was taken at [facility] revealing some nonspecific ST-T wave abnormalities inferiorly with a hard of 106 beats per minute. The patient had not taken her medication prior to her office visit where this occurred. Review of the nose reveals a blood pressure 133/91 on her initial assessment vital signs. Today's EKG reveals normal sinus rhythm with PVCs was continued as he abnormalities noted in the inferior leads.  We will plan a Lexus cannot stress Myoview as she is unable to walk on the treadmill secondary to a left foot sprain. Review of most recent stress nuclear study was completed in 2009 and found to be normal. She also had a stress echocardiogram in 2011 which was also found to be normal without any wall motion abnormalities. There was also no EKG changes at that time. I will check a potassium level as well secondary to frequent PVCs and nonspecific changes in her EKG.

## 2012-03-14 NOTE — Assessment & Plan Note (Signed)
Blood pressure is moderately controlled. She has recently had increase of her lisinopril from 20 mg daily to 40 mg daily per Dr. Juanetta Gosling. We will increase her metoprolol as well to 75 mg daily which will help with the frequent PVCs and hypertension. She will followup with Dr. Dietrich Pates after the stress test is completed along with review of labs, for further recommendations. She verbalizes understanding of the changes in her medications and the plan stress test and is willing to proceed. It is noted that she had been on Norvasc in the past, with no changes in her symptoms. If she is unable to tolerate increased beta blocker, this may be reinstituted.

## 2012-03-14 NOTE — Patient Instructions (Addendum)
Wear brace x 4 weeks   Foot Sprain You have a sprained foot. When you twist your foot, the ligaments that hold the joints together are injured. This may cause pain, swelling, bruising, and difficulty walking. Proper treatment will shorten your disability and help you prevent re-injury. To treat a sprained foot you should:  Elevate your foot for the next 2-3 days to reduce swelling.   Apply ice packs to the foot for 20-30 minutes every 2-3 hours.   Wrap your foot with a compression bandage as long as it is swollen or tender.   Do not walk on your foot if it still hurts a lot.  Use crutches or a cane until weight bearing becomes painless.   Special podiatric shoes or shoes with rigid soles may be useful in allowing earlier walking.  Only take over-the-counter or prescription medicines for pain, discomfort, or fever as directed by your caregiver. Most foot sprains will heal completely in 3-6 weeks with proper rest.  If you still have pain or swelling after 2-3 weeks, or if your pain worsens, you should see your doctor for further evaluation. Document Released: 08/03/2004 Document Revised: 06/15/2011 Document Reviewed: 06/27/2008 [facility] Patient Information 2012 [place], [place].

## 2012-03-14 NOTE — Progress Notes (Signed)
  Subjective:    Patient ID: Lindsay Walsh, female    DOB: 03-30-1971, 41 y.o.   MRN: [id number]  HPI Comments: The patient says with persistent left foot pain and swelling on the lateral side of the foot after a inversion injury in her yard. She tried a postop shoe didn't fit well and didn't help her pain. There is some swelling is worse when she walks on it is better if she stays off of it.  Foot Pain This is a new problem. The current episode started more than 1 month ago. The problem occurs intermittently. The problem has been unchanged. Associated symptoms include abdominal pain, arthralgias, chest pain and numbness.      Review of Systems  Constitutional: Negative.   Eyes: Positive for visual disturbance.  Respiratory: Positive for shortness of breath.   Cardiovascular: Positive for chest pain.  Gastrointestinal: Positive for abdominal pain.  Musculoskeletal: Positive for arthralgias.  Skin: Negative.   Neurological: Positive for numbness.       Objective:   Physical Exam  Nursing note and vitals reviewed. Constitutional: She is oriented to person, place, and time. She appears well-developed and well-nourished. No distress.  Cardiovascular: Intact distal pulses.   Musculoskeletal:       Ambulation is marked by a antalgic gait there tenderness on the lateral border of the foot and also in the calcaneocuboid joint. Range of motion in the ankle is normal the collateral ligaments are stable muscle tone in the foot is normal and skin is normal.  Lymphadenopathy:       Negative lymph nodes left leg  Neurological: She is alert and oriented to person, place, and time. She has normal reflexes.  Skin: Skin is warm and dry. No rash noted. No erythema. No pallor.  Psychiatric: She has a normal mood and affect. Her behavior is normal. Judgment and thought content normal.     X-rays were done at the hospital including ankle and foot no abnormalities were seen    Assessment & Plan:    Sprain foot  Cam Walker short version times one month followup 4 weeks repeat exam

## 2012-03-15 ENCOUNTER — Encounter: Payer: Self-pay | Admitting: Adult Health

## 2012-03-16 LAB — BASIC METABOLIC PANEL
CO2: 31 mEq/L (ref 19–32)
Calcium: 9.6 mg/dL (ref 8.4–10.5)
Creat: 0.6 mg/dL (ref 0.50–1.10)
Glucose, Bld: 83 mg/dL (ref 70–99)

## 2012-03-20 ENCOUNTER — Encounter (HOSPITAL_COMMUNITY)
Admission: RE | Admit: 2012-03-20 | Discharge: 2012-03-20 | Disposition: A | Discharge status: Home / Self Care | Payer: PRIVATE HEALTH INSURANCE | Source: Ambulatory Visit | Attending: Adult Health | Admitting: Adult Health

## 2012-03-20 ENCOUNTER — Encounter (HOSPITAL_COMMUNITY): Payer: Self-pay

## 2012-03-20 ENCOUNTER — Ambulatory Visit (INDEPENDENT_AMBULATORY_CARE_PROVIDER_SITE_OTHER): Payer: PRIVATE HEALTH INSURANCE

## 2012-03-20 DIAGNOSIS — R000 Tachycardia, unspecified: Secondary | ICD-10-CM

## 2012-03-20 DIAGNOSIS — I10 Essential (primary) hypertension: Secondary | ICD-10-CM

## 2012-03-20 DIAGNOSIS — R079 Chest pain, unspecified: Secondary | ICD-10-CM

## 2012-03-20 DIAGNOSIS — R002 Palpitations: Secondary | ICD-10-CM | POA: Insufficient documentation

## 2012-03-20 MED ORDER — TECHNETIUM TC 99M SESTAMIBI - CARDIOLITE
10.0000 | Freq: Once | INTRAVENOUS | Status: AC | PRN
  Administered 2012-03-20: 9.6 via INTRAVENOUS

## 2012-03-20 MED ORDER — TECHNETIUM TC 99M SESTAMIBI - CARDIOLITE
30.0000 | Freq: Once | INTRAVENOUS | Status: AC | PRN
  Administered 2012-03-20: 30 via INTRAVENOUS

## 2012-03-20 NOTE — Progress Notes (Signed)
Stress Lab Nurses Notes - [facility]  TERRIAN RIDLON 03/20/2012 Reason for doing test: Chest Pain and palpitation  Type of test: Steffanie Dunn Nurse performing test: Parke Poisson, RN Nuclear Medicine Tech: Lyndel Pleasure Echo Tech: Not Applicable MD performing test: R. Rothbart Family MD: Juanetta Gosling Test explained and consent signed: yes IV started: 22g jelco, Saline lock flushed, No redness or edema and Saline lock started in radiology Symptoms: SOB & stomach discomfort Treatment/Intervention: None Reason test stopped: protocol completed After recovery IV was: Discontinued via X-ray tech and No redness or edema Patient to return to Nuc. Med at : 12:15 Patient discharged: Home Patient's Condition upon discharge was: stable Comments: During test BP 132/78 & HR 112.  Recovery BP 120/80 & HR 90.  Symptoms resolved in recovery. Erskine Speed T

## 2012-03-29 ENCOUNTER — Telehealth: Payer: Self-pay | Admitting: Cardiology

## 2012-03-29 NOTE — Telephone Encounter (Signed)
Patient called with results of testing.

## 2012-03-29 NOTE — Telephone Encounter (Signed)
Patient's appointment was rescheduled due to Dr.Rothbart not having an extender.  Patient was due for 2 wk f/u.  Her appointment now has been pushed for 3 weeks.  Can you please call patient with her test results as she said she has been really worried about them. / tg

## 2012-04-02 ENCOUNTER — Ambulatory Visit: Payer: Self-pay | Admitting: Cardiology

## 2012-04-03 ENCOUNTER — Ambulatory Visit (HOSPITAL_COMMUNITY): Payer: Self-pay | Admitting: Psychiatry

## 2012-04-04 ENCOUNTER — Other Ambulatory Visit (HOSPITAL_COMMUNITY): Payer: Self-pay | Admitting: Pulmonary Disease

## 2012-04-04 DIAGNOSIS — Z09 Encounter for follow-up examination after completed treatment for conditions other than malignant neoplasm: Secondary | ICD-10-CM

## 2012-04-10 ENCOUNTER — Ambulatory Visit: Payer: Self-pay | Admitting: Physician Assistant

## 2012-04-10 ENCOUNTER — Ambulatory Visit (HOSPITAL_COMMUNITY)
Admission: RE | Admit: 2012-04-10 | Discharge: 2012-04-10 | Disposition: A | Discharge status: Home / Self Care | Payer: PRIVATE HEALTH INSURANCE | Source: Ambulatory Visit | Attending: Pulmonary Disease | Admitting: Pulmonary Disease

## 2012-04-10 DIAGNOSIS — N63 Unspecified lump in unspecified breast: Secondary | ICD-10-CM | POA: Insufficient documentation

## 2012-04-10 DIAGNOSIS — Z09 Encounter for follow-up examination after completed treatment for conditions other than malignant neoplasm: Secondary | ICD-10-CM | POA: Insufficient documentation

## 2012-04-11 ENCOUNTER — Ambulatory Visit: Payer: Self-pay | Admitting: Orthopedic Surgery

## 2012-04-11 ENCOUNTER — Encounter: Payer: Self-pay | Admitting: Orthopedic Surgery

## 2012-04-15 ENCOUNTER — Other Ambulatory Visit (INDEPENDENT_AMBULATORY_CARE_PROVIDER_SITE_OTHER): Payer: Self-pay | Admitting: Internal Medicine

## 2012-04-30 ENCOUNTER — Encounter (HOSPITAL_COMMUNITY): Payer: Self-pay | Admitting: Psychiatry

## 2012-04-30 ENCOUNTER — Ambulatory Visit (INDEPENDENT_AMBULATORY_CARE_PROVIDER_SITE_OTHER): Payer: 59 | Admitting: Psychiatry

## 2012-04-30 VITALS — Wt 186.0 lb

## 2012-04-30 DIAGNOSIS — F329 Major depressive disorder, single episode, unspecified: Secondary | ICD-10-CM

## 2012-04-30 MED ORDER — DULOXETINE HCL 60 MG PO CPEP: 60.0000 mg | ORAL_CAPSULE | Freq: Every day | ORAL | Status: DC

## 2012-04-30 MED ORDER — AMPHETAMINE-DEXTROAMPHETAMINE 20 MG PO TABS: 20.0000 mg | ORAL_TABLET | Freq: Every day | ORAL | Status: DC
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
## 2012-05-09 ENCOUNTER — Ambulatory Visit (INDEPENDENT_AMBULATORY_CARE_PROVIDER_SITE_OTHER): Payer: 59 | Admitting: Psychiatry

## 2012-05-09 DIAGNOSIS — F329 Major depressive disorder, single episode, unspecified: Secondary | ICD-10-CM

## 2012-05-09 NOTE — Patient Instructions (Signed)
Discussed orally 
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
## 2012-05-13 ENCOUNTER — Encounter: Payer: Self-pay | Admitting: Adult Health

## 2012-05-13 ENCOUNTER — Ambulatory Visit (INDEPENDENT_AMBULATORY_CARE_PROVIDER_SITE_OTHER): Payer: PRIVATE HEALTH INSURANCE | Admitting: Adult Health

## 2012-05-13 VITALS — BP 119/72 | HR 74 | Ht 60.0 in | Wt 184.1 lb

## 2012-05-13 DIAGNOSIS — I10 Essential (primary) hypertension: Secondary | ICD-10-CM

## 2012-05-13 DIAGNOSIS — R079 Chest pain, unspecified: Secondary | ICD-10-CM

## 2012-05-13 NOTE — Assessment & Plan Note (Signed)
Excellent control of BP currently. She states that she notices her BP rising with anxiety most. She is trying to quit smoking. I have explained to her that smoking can cause vasodilitation and therefore increase BP.  I have encouraged her to quit smoking.  She will continue on current medication regimen. I will see her in one year unless new symptoms occur.

## 2012-05-13 NOTE — Progress Notes (Signed)
HPI: Mrs.Lindsay Walsh comes for ongoing assessment and treatment of hypertension with history of chest pain and palpitations. She also suffers from fibromyalgia, anxiety and has ongoing tobacco abuse. She states she is taking NTG 1-2 times a week for recurrent chest pain. She states that she sometimes notices her BP going up without warning, either at rest or exertion, and sometimes with anxiety attack.  She is to start taking Chantix tomorrow for smoking cessation.   Allergies  Allergen Reactions  . Fentanyl Itching and Other (See Comments)    Heart racing, SOB  . Iohexol Other (See Comments)    Knots on body   . Ivp Dye (Iodinated Diagnostic Agents) Other (See Comments)    Knots on body  . Wellbutrin (Bupropion Hcl) Other (See Comments)    unknown  . Doxycycline Rash  . Tape Rash    Current Outpatient Prescriptions  Medication Sig Dispense Refill  . amphetamine-dextroamphetamine (ADDERALL) 20 MG tablet Take 1 tablet (20 mg total) by mouth daily.  30 tablet  0  . Ascorbic Acid (VITAMIN C) 1000 MG tablet Take 1,000 mg by mouth daily.      Marland Kitchen azaTHIOprine (IMURAN) 50 MG tablet 50 mg daily.       . Biotin 2500 MCG CAPS Take 2,500 mcg by mouth daily.      . calcium-vitamin D (OS-CAL 500 + D) 500-200 MG-UNIT per tablet Take 1 tablet by mouth 2 (two) times daily.       . cyanocobalamin 2000 MCG tablet Take 1,500 mcg by mouth daily. Patient is taking 1500 mcg daily.      Marland Kitchen dicyclomine (BENTYL) 10 MG capsule Take 10 mg by mouth 3 (three) times daily as needed. For cramps      . DULoxetine (CYMBALTA) 60 MG capsule Take 1 capsule (60 mg total) by mouth daily.  30 capsule  1  . estradiol (CLIMARA - DOSED IN MG/24 HR) 0.075 mg/24hr Place 1 patch onto the skin once a week.        Marland Kitchen ESZOPICLONE 3 MG tablet Take 3 mg by mouth as needed.       . fexofenadine (ALLEGRA) 60 MG tablet Take 180 mg by mouth daily.       . furosemide (LASIX) 40 MG tablet Take 40 mg by mouth daily.       Marland Kitchen GARLIC PO Take  1,000 mg by mouth daily.       Marland Kitchen levETIRAcetam (KEPPRA) 750 MG tablet Take 750 mg by mouth at bedtime.       . lidocaine (LIDODERM) 5 % Place 1 patch onto the skin daily. Remove & Discard patch within 12 hours or as directed by MD      . lisinopril (PRINIVIL,ZESTRIL) 10 MG tablet Take 40 mg by mouth daily.       . methocarbamol (ROBAXIN) 500 MG tablet Take 500 mg by mouth as needed.       . metoprolol succinate (TOPROL-XL) 25 MG 24 hr tablet Take 3 tablets (75 mg total) by mouth daily.  270 tablet  3  . multivitamin-iron-minerals-folic acid (CENTRUM) chewable tablet Chew 1 tablet by mouth daily.        Marland Kitchen NEXIUM 40 MG capsule take 1 capsule by mouth every morning  30 capsule  11  . NITROSTAT 0.4 MG SL tablet place 1 tablet under the tongue if needed every 5 minutes for chest pain for 3 doses IF NO RELIEF AFTER 3RD DOSE CALL PRESCRIBER OR 911.  25 tablet  6  . oxymorphone (OPANA ER) 5 MG 12 hr tablet Take 5 mg by mouth every 12 (twelve) hours.      . penicillin v potassium (VEETID) 500 MG tablet Take 500 mg by mouth 4 (four) times daily.      . pramipexole (MIRAPEX) 0.25 MG tablet Take 0.25 mg by mouth as needed. For restless legs      . Probiotic Product (ALIGN PO) Take 1 capsule by mouth daily.         Past Medical History  Diagnosis Date  . Gastroesophageal reflux disease     Chronic abdominal pain; gastroparesis; globus hystericus; irritable bowel syndrome  . Hypertension   . Depression   . Fibromyalgia   . Chest pain     Normal stress echo in 2011; PVCs; pedal edema  . Tobacco abuse     one pack per day; 35 pack years  . Overweight   . Narcotic dependence   . Pulmonary nodule   . Fasting hyperglycemia   . Shortness of breath     with exertion  . Sleep apnea   . Autoimmune hepatitis   . Irritable bowel syndrome   . Arthritis     back  . Angina     took SL nitro one week ago  . Dysrhythmia     palpatations    Past Surgical History  Procedure Date  . Upper  gastrointestinal endoscopy 09/13/2010  . Liver biopsy     x4  . Total abdominal hysterectomy w/ bilateral salpingoophorectomy   . Cesarean section     X3  . Tubal ligation     Bilateral  . Abdominal hysterectomy   . Cholecystectomy   . Bladder suspension 09/12/2011    Procedure: TRANSVAGINAL TAPE (TVT) PROCEDURE;  Surgeon: Ky Barban, MD;  Location: [facility];  Service: Urology;  Laterality: N/A;  . Lumbar epidural injection 01/2012    ZOX:[id number] of systems complete and found to be negative unless listed above PHYSICAL EXAM BP 119/72  Pulse 74  Ht 5' (1.524 m)  Wt 184 lb 1.3 oz (83.498 kg)  BMI 35.95 kg/m2  General: Well developed, well nourished, in no acute distress Head: Eyes PERRLA, No xanthomas.   Normal cephalic and atramatic  Lungs: Clear bilaterally to auscultation and percussion. Heart: HRRR S1 S2, without MRG.  Pulses are 2+ & equal.            No carotid bruit. No JVD.  No abdominal bruits. No femoral bruits. Abdomen: Bowel sounds are positive, abdomen soft and non-tender without masses or                  Hernia's noted. Msk:  Back normal, normal gait. Normal strength and tone for age. Extremities: No clubbing, cyanosis or edema.  DP +1 Neuro: Alert and oriented X 3. Psych:  Good affect, responds appropriately  ASSESSMENT AND PLAN:

## 2012-05-13 NOTE — Assessment & Plan Note (Signed)
She is having occasional discomfort in her chest but it is not related to activity. Use of NTG is intermittent. Will not place on long acting nitrate at this time.  Cloudy etiology with multiple reasons, to include anxiety, fibromyalgia.  Will continue to monitor this. Will see her in one year unless symptomatic.

## 2012-05-13 NOTE — Patient Instructions (Addendum)
Your physician recommends that you schedule a follow-up appointment in: 1 year follow up  

## 2012-05-24 ENCOUNTER — Ambulatory Visit (HOSPITAL_COMMUNITY): Payer: Self-pay | Admitting: Psychiatry

## 2012-06-28 ENCOUNTER — Telehealth: Payer: Self-pay | Admitting: Cardiology

## 2012-06-28 ENCOUNTER — Encounter (HOSPITAL_COMMUNITY): Payer: Self-pay | Admitting: Psychiatry

## 2012-06-28 ENCOUNTER — Ambulatory Visit (INDEPENDENT_AMBULATORY_CARE_PROVIDER_SITE_OTHER): Payer: 59 | Admitting: Psychiatry

## 2012-06-28 VITALS — Wt 186.4 lb

## 2012-06-28 DIAGNOSIS — F17200 Nicotine dependence, unspecified, uncomplicated: Secondary | ICD-10-CM

## 2012-06-28 DIAGNOSIS — IMO0001 Reserved for inherently not codable concepts without codable children: Secondary | ICD-10-CM

## 2012-06-28 DIAGNOSIS — F329 Major depressive disorder, single episode, unspecified: Secondary | ICD-10-CM

## 2012-06-28 MED ORDER — NICOTINE 21 MG/24HR TD PT24: 1.0000 | MEDICATED_PATCH | TRANSDERMAL | Status: DC

## 2012-06-28 MED ORDER — NICOTINE 7 MG/24HR TD PT24: 1.0000 | MEDICATED_PATCH | TRANSDERMAL | Status: DC

## 2012-06-28 MED ORDER — DULOXETINE HCL 60 MG PO CPEP: 60.0000 mg | ORAL_CAPSULE | Freq: Every day | ORAL | Status: DC

## 2012-06-28 MED ORDER — NICOTINE 14 MG/24HR TD PT24: 1.0000 | MEDICATED_PATCH | TRANSDERMAL | Status: DC

## 2012-06-28 MED ORDER — AMPHETAMINE-DEXTROAMPHETAMINE 20 MG PO TABS: 20.0000 mg | ORAL_TABLET | Freq: Every day | ORAL | Status: DC

## 2012-06-28 NOTE — Patient Instructions (Addendum)
Minipress 1 mg up to 8 mg at night is very helpful for getting 8 hours of sleep and for suppressing night mares for 8 hours too.  This will need to be adjusted by those prescribing the other BP meds.     CUT BACK/CUT OUT on sugar and carbohydrates, that means very limited fruits and starchy vegetables and very limited grains, breads  The goal is low GLYCEMIC INDEX.  CUT OUT all wheat, rye, or barley for the gluten in them.  HIGH fat and LOW carbohydrate diet is the KEY.  Eat avocados, eggs, lean meat like grass fed beef and chicken  Nuts and seeds would be good foods as well.   Stevia is an excellent sweetener.  Safe for the brain.   Almond butter is awesome.  For smoking cessation, should continue 21 mg daily for 4 weeks, then 14 mg daily for 4 weeks, then 7 mg daily for 4 final weeks.  Call the Smoking Cessation hotline at  647 250 5524  for support.  REMEMBER these cost the about the same as a pack of cigarettes a day, but when you stop, you have $5 a day more to spend on yourself and your health.  Put the same money you ar currently spending on cigarettes into the bank and don't touch it until June when you can go to the beach with a nice sum of money.   Have a happy holiday.

## 2012-06-28 NOTE — Telephone Encounter (Signed)
Please advise 

## 2012-06-28 NOTE — Telephone Encounter (Signed)
Patient was seen by Dr.Walker at [facility].  Patient was diagnosed with PTSD.  Patient states that Dr.Walker wants to put  her on Minipress 1mg - 8mg  at night to help her sleep.  States that this may interfere with BP meds and would like Cardiologist to adjust as needed. / tgs
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
## 2012-07-02 NOTE — Telephone Encounter (Signed)
Minipress, especially at the lower doses, should not cause problems. Please ask patient to monitor her blood pressure and to call for dizziness or systolics less than 100.

## 2012-07-04 NOTE — Telephone Encounter (Signed)
Patient called with recommendations.  

## 2012-07-18 ENCOUNTER — Emergency Department (HOSPITAL_COMMUNITY)
Admission: EM | Admit: 2012-07-18 | Discharge: 2012-07-18 | Disposition: A | Discharge status: Home / Self Care | Payer: PRIVATE HEALTH INSURANCE | Attending: Emergency Medicine | Admitting: Emergency Medicine

## 2012-07-18 ENCOUNTER — Telehealth: Payer: Self-pay | Admitting: Cardiology

## 2012-07-18 ENCOUNTER — Emergency Department (HOSPITAL_COMMUNITY): Payer: PRIVATE HEALTH INSURANCE

## 2012-07-18 ENCOUNTER — Encounter (HOSPITAL_COMMUNITY): Payer: Self-pay | Admitting: Radiology

## 2012-07-18 DIAGNOSIS — I10 Essential (primary) hypertension: Secondary | ICD-10-CM | POA: Insufficient documentation

## 2012-07-18 DIAGNOSIS — R05 Cough: Secondary | ICD-10-CM | POA: Insufficient documentation

## 2012-07-18 DIAGNOSIS — Z79899 Other long term (current) drug therapy: Secondary | ICD-10-CM | POA: Insufficient documentation

## 2012-07-18 DIAGNOSIS — Z8679 Personal history of other diseases of the circulatory system: Secondary | ICD-10-CM | POA: Insufficient documentation

## 2012-07-18 DIAGNOSIS — F329 Major depressive disorder, single episode, unspecified: Secondary | ICD-10-CM | POA: Insufficient documentation

## 2012-07-18 DIAGNOSIS — R209 Unspecified disturbances of skin sensation: Secondary | ICD-10-CM | POA: Insufficient documentation

## 2012-07-18 DIAGNOSIS — F4310 Post-traumatic stress disorder, unspecified: Secondary | ICD-10-CM | POA: Insufficient documentation

## 2012-07-18 DIAGNOSIS — R059 Cough, unspecified: Secondary | ICD-10-CM | POA: Insufficient documentation

## 2012-07-18 DIAGNOSIS — Z8709 Personal history of other diseases of the respiratory system: Secondary | ICD-10-CM | POA: Insufficient documentation

## 2012-07-18 DIAGNOSIS — Z8719 Personal history of other diseases of the digestive system: Secondary | ICD-10-CM | POA: Insufficient documentation

## 2012-07-18 DIAGNOSIS — R61 Generalized hyperhidrosis: Secondary | ICD-10-CM | POA: Insufficient documentation

## 2012-07-18 DIAGNOSIS — R079 Chest pain, unspecified: Secondary | ICD-10-CM

## 2012-07-18 DIAGNOSIS — F17200 Nicotine dependence, unspecified, uncomplicated: Secondary | ICD-10-CM | POA: Insufficient documentation

## 2012-07-18 DIAGNOSIS — Z8739 Personal history of other diseases of the musculoskeletal system and connective tissue: Secondary | ICD-10-CM | POA: Insufficient documentation

## 2012-07-18 DIAGNOSIS — F3289 Other specified depressive episodes: Secondary | ICD-10-CM | POA: Insufficient documentation

## 2012-07-18 LAB — CBC WITH DIFFERENTIAL/PLATELET
Eosinophils Relative: 2 % (ref 0–5)
HCT: 37.3 % (ref 36.0–46.0)
Lymphocytes Relative: 22 % (ref 12–46)
Lymphs Abs: 2.1 10*3/uL (ref 0.7–4.0)
MCV: 91 fL (ref 78.0–100.0)
Monocytes Absolute: 1 10*3/uL (ref 0.1–1.0)
Monocytes Relative: 10 % (ref 3–12)
RBC: 4.1 MIL/uL (ref 3.87–5.11)
WBC: 9.8 10*3/uL (ref 4.0–10.5)

## 2012-07-18 LAB — BASIC METABOLIC PANEL
CO2: 29 mEq/L (ref 19–32)
Chloride: 103 mEq/L (ref 96–112)
Sodium: 138 mEq/L (ref 135–145)

## 2012-07-18 MED ORDER — OXYCODONE-ACETAMINOPHEN 5-325 MG PO TABS
1.0000 | ORAL_TABLET | Freq: Once | ORAL | Status: AC
  Administered 2012-07-18: 1 via ORAL
  Filled 2012-07-18: qty 1

## 2012-07-18 MED ORDER — NITROGLYCERIN 0.4 MG SL SUBL
0.4000 mg | SUBLINGUAL_TABLET | SUBLINGUAL | Status: AC | PRN
  Administered 2012-07-18 (×3): 0.4 mg via SUBLINGUAL
  Filled 2012-07-18: qty 25

## 2012-07-18 MED ORDER — ASPIRIN 325 MG PO TABS
325.0000 mg | ORAL_TABLET | Freq: Once | ORAL | Status: AC
  Administered 2012-07-18: 325 mg via ORAL
  Filled 2012-07-18: qty 1

## 2012-07-18 NOTE — ED Notes (Signed)
Has had crackers and coke per Dr. Bebe Shaggy permission, pt tolerated well and feels better

## 2012-07-18 NOTE — Telephone Encounter (Signed)
Advised patient to be seen in the ED, as she states she has had constant chest pain with hypertension.

## 2012-07-18 NOTE — ED Notes (Signed)
Left sided cp that started last night.  Pt reports taking Nitro SL x 2 last night with relief.  Woke up this morning with cp again.  Took 1 nitro PTA with minimal relief.  Denies sob/n/v/weakness.

## 2012-07-18 NOTE — ED Provider Notes (Signed)
History   This chart was scribed for Lindsay Gaskins, MD, by Frederik Pear, ER scribe. The patient was seen in room [facility]04[facility] and the patient's care was started at 1734.    CSN: [id number]  Arrival date & time 07/18/12  1705   First MD Initiated Contact with Patient 07/18/12 1734      Chief complaint - chest pain  Patient is a 42 y.o. female presenting with chest pain. The history is provided by the patient. No language interpreter was used.  Chest Pain The chest pain began yesterday. The chest pain is unchanged. The pain is associated with stress. Primary symptoms include cough. Pertinent negatives for primary symptoms include no fever, no shortness of breath, no abdominal pain and no vomiting.  Associated symptoms include diaphoresis and numbness.  Pertinent negatives for associated symptoms include no lower extremity edema and no near-syncope. She tried nitroglycerin for the symptoms. Risk factors include smoking/tobacco exposure and stress.  Her past medical history is significant for hypertension.  Pertinent negatives for past medical history include no DVT and no MI.  Procedure history is positive for stress echo.  Procedure history is negative for cardiac catheterization.    TAELA Walsh is a 42 y.o. female who presents to the Emergency Department complaining of constant, moderate, squeezing, 4/10  chest pain that radiates under her left arm with associated left arm paresthesia that is aggravated by stress and began last night. She also reports associated diaphoresis, which has since resolved. She has a h/o of similar waxing and waning pain that will last for hours at a time. She denies any emesis, SOB, syncope, abdominal pain, or extremity pain or edema. She took 2 nitro last night with relief from the chest pain. She has a h/o of hypertension and a family h/o of cardiac disease. She denies any recent travel, surgery, or h/o of MIs and DVTs. She has previously taken stress tests,  which were negative. She is a current, everyday smoker.   cardiologist is Dr. Dietrich Pates, but she denies h/o CAD  Past Medical History  Diagnosis Date  . Gastroesophageal reflux disease     Chronic abdominal pain; gastroparesis; globus hystericus; irritable bowel syndrome  . Hypertension   . Depression   . Fibromyalgia   . Chest pain     Normal stress echo in 2011; PVCs; pedal edema  . Tobacco abuse     one pack per day; 35 pack years  . Overweight   . Narcotic dependence   . Pulmonary nodule   . Fasting hyperglycemia   . Shortness of breath     with exertion  . Sleep apnea   . Autoimmune hepatitis   . Irritable bowel syndrome   . Arthritis     back  . Angina     took SL nitro one week ago  . Dysrhythmia     palpatations  . PTSD (post-traumatic stress disorder)     Past Surgical History  Procedure Date  . Upper gastrointestinal endoscopy 09/13/2010  . Liver biopsy     x4  . Total abdominal hysterectomy w/ bilateral salpingoophorectomy   . Cesarean section     X3  . Tubal ligation     Bilateral  . Abdominal hysterectomy   . Cholecystectomy   . Bladder suspension 09/12/2011    Procedure: TRANSVAGINAL TAPE (TVT) PROCEDURE;  Surgeon: Ky Barban, MD;  Location: [facility];  Service: Urology;  Laterality: N/A;  . Lumbar epidural injection 01/2012    Family  History  Problem Relation Age of Onset  . Diabetes Mother   . Hypertension Mother   . Anxiety disorder Mother   . Depression Mother   . Drug abuse Mother   . Heart disease Father   . Diabetes Father   . Anxiety disorder Father   . Alcohol abuse Father   . Depression Father   . OCD Father   . Healthy Sister   . Healthy Brother   . Healthy Daughter   . Healthy Son   . ADD / ADHD Son   . Anesthesia problems Neg Hx   . Malignant hyperthermia Neg Hx   . Pseudochol deficiency Neg Hx   . Hypotension Neg Hx   . Bipolar disorder Neg Hx   . Dementia Neg Hx   . Paranoid behavior Neg Hx   . Schizophrenia Neg  Hx   . Sexual abuse Neg Hx   . Physical abuse Neg Hx   . Alcohol abuse Paternal Grandfather   . Depression Paternal Grandfather   . Seizures Paternal Grandmother   . ADD / ADHD Son     History  Substance Use Topics  . Smoking status: Current Every Day Smoker -- 1.5 packs/day for 25 years    Types: Cigarettes  . Smokeless tobacco: Never Used  . Alcohol Use: No    OB History    Grav Para Term Preterm Abortions TAB SAB Ect Mult Living                  Review of Systems  Constitutional: Positive for diaphoresis. Negative for fever.  Respiratory: Positive for cough. Negative for shortness of breath.   Cardiovascular: Positive for chest pain. Negative for near-syncope.  Gastrointestinal: Negative for vomiting and abdominal pain.  Neurological: Positive for numbness. Negative for syncope.  All other systems reviewed and are negative.    Allergies  Doxycycline; Fentanyl; Iohexol; Ivp dye; Wellbutrin; and Tape  Home Medications   Current Outpatient Rx  Name  Route  Sig  Dispense  Refill  . AMPHETAMINE-DEXTROAMPHETAMINE 20 MG PO TABS   Oral   Take 1 tablet (20 mg total) by mouth daily.   30 tablet   0   . VITAMIN C 1000 MG PO TABS   Oral   Take 1,000 mg by mouth every morning.          . AZATHIOPRINE 50 MG PO TABS   Oral   Take 50 mg by mouth daily.          Marland Kitchen BIOTIN 2500 MCG PO CAPS   Oral   Take 2,500 mcg by mouth every morning.          Marland Kitchen CALCIUM CARBONATE-VITAMIN D 500-200 MG-UNIT PO TABS   Oral   Take 1 tablet by mouth 2 (two) times daily.          . CYANOCOBALAMIN 2000 MCG PO TABS   Oral   Take 1,500 mcg by mouth daily. Patient is taking 1500 mcg daily.         Marland Kitchen DICYCLOMINE HCL 10 MG PO CAPS   Oral   Take 10 mg by mouth 3 (three) times daily as needed. For cramps         . DULOXETINE HCL 60 MG PO CPEP   Oral   Take 1 capsule (60 mg total) by mouth daily.   30 capsule   1   . ESOMEPRAZOLE MAGNESIUM 40 MG PO CPDR   Oral   Take 40 mg  by  mouth every morning.         Marland Kitchen ESTRADIOL 0.075 MG/24HR TD PTWK   Transdermal   Place 1 patch onto the skin once a week.           Alfonso Patten 3 MG PO TABS   Oral   Take 3 mg by mouth at bedtime as needed. For sleep         . FUROSEMIDE 40 MG PO TABS   Oral   Take 40 mg by mouth daily.          Marland Kitchen GARLIC PO   Oral   Take 1,000 mg by mouth daily.          Marland Kitchen LEVETIRACETAM 750 MG PO TABS   Oral   Take 750 mg by mouth at bedtime.          Marland Kitchen LIDOCAINE 5 % EX PTCH   Transdermal   Place 1 patch onto the skin daily. Remove & Discard patch within 12 hours or as directed by MD         . METHOCARBAMOL 500 MG PO TABS   Oral   Take 500 mg by mouth daily as needed. For spasm         . METOPROLOL SUCCINATE ER 25 MG PO TB24   Oral   Take 75 mg by mouth every morning.         . CENTRUM PO CHEW   Oral   Chew 1 tablet by mouth daily.           Marland Kitchen NITROSTAT 0.4 MG SL SUBL      place 1 tablet under the tongue if needed every 5 minutes for chest pain for 3 doses IF NO RELIEF AFTER 3RD DOSE CALL PRESCRIBER OR 911.   25 tablet   6     Send future refills to primary care doctor.   Marland Kitchen PRAMIPEXOLE DIHYDROCHLORIDE 0.25 MG PO TABS   Oral   Take 0.25 mg by mouth at bedtime as needed. For restless legs         . ALIGN PO   Oral   Take 1 capsule by mouth daily.          Marland Kitchen METHADONE HCL 5 MG PO TABS   Oral   Take 5 mg by mouth 4 (four) times daily as needed. For pain           BP 127/71  Pulse 70  Temp 98.4 F (36.9 C) (Oral)  Resp 17  Ht 5' (1.524 m)  Wt 184 lb (83.462 kg)  BMI 35.94 kg/m2  SpO2 100%  Physical Exam  CONSTITUTIONAL: Well developed/well nourished HEAD AND FACE: Normocephalic/atraumatic EYES: EOMI/PERRL ENMT: Mucous membranes moist NECK: supple no meningeal signs SPINE:entire spine nontender CV: S1/S2 noted, no murmurs/rubs/gallops noted, left chest wall tenderness LUNGS: Lungs are clear to auscultation bilaterally, no apparent  distress ABDOMEN: soft, nontender, no rebound or guarding GU:no cva tenderness NEURO: Pt is awake/alert, moves all extremitiesx4 EXTREMITIES: pulses normal, full ROM, no calf tenderness or edema SKIN: warm, color normal PSYCH: no abnormalities of mood noted  ED Course  Procedures DIAGNOSTIC STUDIES: Oxygen Saturation is 98% on room air, normal by my interpretation.    COORDINATION OF CARE:  19:03- Discussed planned course of treatment with the patient, including Nitrostat and aspirin, who is agreeable at this time.  19:03- Medication Orders- nitroglycerin (nitrostat) SL tablet 0.4 mg- every 5 hours.  19:15- Medication Orders- aspirin tablet 325 mg- once.  21:45-  Medication Orders- oxycodone-acetaminophen (percocet/roxicet) 5-325 mg per tablet 1 tablet- once.   HEART score less than 3.  Pt with some atypical features (pain reproducible, brief squeezing)  But she does report response to NTG.  Will check two separate troponins and if negative can f/u with caridology next week.  Her BP has improved from home readings  Pt improved.  Ekg/troponins negative.  Stable for d/c  Results for orders placed during the hospital encounter of 07/18/12  BASIC METABOLIC PANEL      Component Value Range   Sodium 138  135 - 145 mEq/L   Potassium 3.5  3.5 - 5.1 mEq/L   Chloride 103  96 - 112 mEq/L   CO2 29  19 - 32 mEq/L   Glucose, Bld 110 (*) 70 - 99 mg/dL   BUN 7  6 - 23 mg/dL   Creatinine, Ser 1.61  0.50 - 1.10 mg/dL   Calcium 9.0  8.4 - 09.6 mg/dL   GFR calc non Af Amer >90  >90 mL/min   GFR calc Af Amer >90  >90 mL/min  CBC WITH DIFFERENTIAL      Component Value Range   WBC 9.8  4.0 - 10.5 K/uL   RBC 4.10  3.87 - 5.11 MIL/uL   Hemoglobin 13.2  12.0 - 15.0 g/dL   HCT 04.5  40.9 - 81.1 %   MCV 91.0  78.0 - 100.0 fL   MCH 32.2  26.0 - 34.0 pg   MCHC 35.4  30.0 - 36.0 g/dL   RDW 91.4  78.2 - 95.6 %   Platelets 278  150 - 400 K/uL   Neutrophils Relative 67  43 - 77 %   Neutro Abs 6.5   1.7 - 7.7 K/uL   Lymphocytes Relative 22  12 - 46 %   Lymphs Abs 2.1  0.7 - 4.0 K/uL   Monocytes Relative 10  3 - 12 %   Monocytes Absolute 1.0  0.1 - 1.0 K/uL   Eosinophils Relative 2  0 - 5 %   Eosinophils Absolute 0.2  0.0 - 0.7 K/uL   Basophils Relative 0  0 - 1 %   Basophils Absolute 0.0  0.0 - 0.1 K/uL  TROPONIN I      Component Value Range   Troponin I <0.30  <0.30 ng/mL   Dg Chest 2 View  07/18/2012  *RADIOLOGY REPORT*  Clinical Data: Chest pain.  CHEST - 2 VIEW  Comparison: Chest CT 06/23/2011.  Findings: Previously seen pulmonary nodule is not resolved by radiography.  Cardiopericardial silhouette within normal limits. Mediastinal contours normal. Trachea midline.  No airspace disease or effusion. Cholecystectomy clips are present in the right upper quadrant.  IMPRESSION: No active cardiopulmonary disease.   Original Report Authenticated By: Andreas Newport, M.D.       MDM  Nursing notes including past medical history and social history reviewed and considered in documentation xrays reviewed and considered Previous records reviewed and considered Labs/vital reviewed and considered - phone call notes reviewed    Date: 07/18/2012  Rate: 76  Rhythm: normal sinus rhythm  QRS Axis: normal  Intervals: normal  ST/T Wave abnormalities: normal  Conduction Disutrbances:none  Narrative Interpretation:   Old EKG Reviewed: unchanged    Date: 07/18/2012 22:31  Rate: 72  Rhythm: normal sinus rhythm  QRS Axis: normal  Intervals: normal  ST/T Wave abnormalities: normal  Conduction Disutrbances:none  Narrative Interpretation:   Old EKG Reviewed: unchanged     I personally performed  the services described in this documentation, which was scribed in my presence. The recorded information has been reviewed and is accurate.          Lindsay Gaskins, MD 07/18/12 [id number]

## 2012-07-18 NOTE — ED Notes (Signed)
MD at bedside. 

## 2012-07-18 NOTE — ED Notes (Signed)
Urine sample collected

## 2012-07-18 NOTE — Telephone Encounter (Signed)
PT HAS BEEN HAVING CP ON AND OFF AND BP IS 170/108/TMJ

## 2012-07-18 NOTE — ED Notes (Addendum)
Pt Repositioned self  to left side,states her chest pain is gone but "the soreness" remains. States discomfort is worse with movement of left arm

## 2012-07-29 ENCOUNTER — Ambulatory Visit (INDEPENDENT_AMBULATORY_CARE_PROVIDER_SITE_OTHER): Payer: Self-pay | Admitting: Internal Medicine

## 2012-08-02 ENCOUNTER — Ambulatory Visit (INDEPENDENT_AMBULATORY_CARE_PROVIDER_SITE_OTHER): Payer: PRIVATE HEALTH INSURANCE | Admitting: Adult Health

## 2012-08-02 ENCOUNTER — Other Ambulatory Visit: Payer: Self-pay | Admitting: *Deleted

## 2012-08-02 ENCOUNTER — Encounter: Payer: Self-pay | Admitting: Adult Health

## 2012-08-02 VITALS — BP 131/89 | HR 77 | Wt 189.0 lb

## 2012-08-02 DIAGNOSIS — R079 Chest pain, unspecified: Secondary | ICD-10-CM

## 2012-08-02 MED ORDER — ISOSORBIDE MONONITRATE 15 MG HALF TABLET: 15.0000 mg | ORAL_TABLET | Freq: Every day | ORAL | Status: DC

## 2012-08-02 MED ORDER — ISOSORBIDE MONONITRATE ER 30 MG PO TB24: 30.0000 mg | ORAL_TABLET | Freq: Every day | ORAL | Status: DC

## 2012-08-02 NOTE — Assessment & Plan Note (Signed)
Chest pain is atypical aggravated by emotional stress. Pain is reproducible with palpation of the midsternum and bilateral chest walls. She continues to insist that the pain may be related to her heart, but admits that becoming angry or upset causes the pain, and not exertion. It is not accompanied by diaphoresis, or dyspnea. NTG helps on occasion.  I will start her on low dose nitrates, isosorbide mono 15 mg daily. Consider tako-tsubo CM or coronary spasm, but no cath has been completed to evaluate for this.  Stress echo in 2011 was negative for WMA or ischemia. She will follow up with Dr. [name] in 3 months. Doubt cardiac etiology for chest pain.

## 2012-08-02 NOTE — Progress Notes (Deleted)
Name: Lindsay Walsh    DOB: October 12, 1970  Age: 42 y.o.  MR#: [id number]       PCP:  Fredirick Maudlin, MD      Insurance: @PAYORNAME @   CC:   No chief complaint on file.   VS BP 131/89  Pulse 77  Wt 189 lb (85.73 kg)  Weights Current Weight  08/02/12 189 lb (85.73 kg)  07/18/12 184 lb (83.462 kg)  06/28/12 186 lb 6.4 oz (84.55 kg)    Blood Pressure  BP Readings from Last 3 Encounters:  08/02/12 131/89  07/18/12 116/69  05/13/12 119/72     Admit date:  (Not on file) Last encounter with [facility]:  05/13/2012   Allergy Allergies  Allergen Reactions  . Doxycycline Shortness Of Breath and Rash  . Fentanyl Shortness Of Breath, Itching and Other (See Comments)    Heart racing, SOB  . Iohexol Other (See Comments)    Knots on body   . Ivp Dye (Iodinated Diagnostic Agents) Other (See Comments)    Knots on body  . Wellbutrin (Bupropion Hcl) Other (See Comments)    unknown  . Tape Rash    Current Outpatient Prescriptions  Medication Sig Dispense Refill  . amphetamine-dextroamphetamine (ADDERALL) 20 MG tablet Take 1 tablet (20 mg total) by mouth daily.  30 tablet  0  . Ascorbic Acid (VITAMIN C) 1000 MG tablet Take 1,000 mg by mouth every morning.       Marland Kitchen azaTHIOprine (IMURAN) 50 MG tablet Take 50 mg by mouth daily.       . Biotin 2500 MCG CAPS Take 2,500 mcg by mouth every morning.       . calcium-vitamin D (OS-CAL 500 + D) 500-200 MG-UNIT per tablet Take 1 tablet by mouth 2 (two) times daily.       . cyanocobalamin 2000 MCG tablet Take 1,500 mcg by mouth daily. Patient is taking 1500 mcg daily.      Marland Kitchen dicyclomine (BENTYL) 10 MG capsule Take 10 mg by mouth 3 (three) times daily as needed. For cramps      . DULoxetine (CYMBALTA) 60 MG capsule Take 1 capsule (60 mg total) by mouth daily.  30 capsule  1  . esomeprazole (NEXIUM) 40 MG capsule Take 40 mg by mouth every morning.      Marland Kitchen estradiol (CLIMARA - DOSED IN MG/24 HR) 0.075 mg/24hr Place 1 patch onto the skin once a week.        Marland Kitchen  ESZOPICLONE 3 MG tablet Take 3 mg by mouth at bedtime as needed. For sleep      . furosemide (LASIX) 40 MG tablet Take 40 mg by mouth daily.       Marland Kitchen GARLIC PO Take 4,098 mg by mouth daily.       Marland Kitchen levETIRAcetam (KEPPRA) 750 MG tablet Take 750 mg by mouth at bedtime.       . lidocaine (LIDODERM) 5 % Place 1 patch onto the skin daily. Remove & Discard patch within 12 hours or as directed by MD      . methadone (DOLOPHINE) 5 MG tablet Take 5 mg by mouth 4 (four) times daily as needed. For pain      . methocarbamol (ROBAXIN) 500 MG tablet Take 500 mg by mouth daily as needed. For spasm      . metoprolol succinate (TOPROL-XL) 25 MG 24 hr tablet Take 75 mg by mouth every morning.      . multivitamin-iron-minerals-folic acid (CENTRUM) chewable tablet Chew 1  tablet by mouth daily.        Marland Kitchen NITROSTAT 0.4 MG SL tablet place 1 tablet under the tongue if needed every 5 minutes for chest pain for 3 doses IF NO RELIEF AFTER 3RD DOSE CALL PRESCRIBER OR 911.  25 tablet  6  . permethrin (ELIMITE) 5 % cream       . pramipexole (MIRAPEX) 0.25 MG tablet Take 0.25 mg by mouth at bedtime as needed. For restless legs      . prazosin (MINIPRESS) 2 MG capsule       . Probiotic Product (ALIGN PO) Take 1 capsule by mouth daily.         Discontinued Meds:   There are no discontinued medications.  Patient Active Problem List  Diagnosis  . TOBACCO ABUSE  . NARCOTIC ABUSE  . PULMONARY NODULE  . AUTOIMMUNE HEPATITIS  . FIBROMYALGIA  . Gastroesophageal reflux disease  . Hypertension  . Depression  . Chest pain  . Fasting hyperglycemia  . IBS (irritable bowel syndrome)  . Weakness  . Hepatitis, autoimmune  . Sprain of foot    LABS Admission on 07/18/2012, Discharged on 07/18/2012  Component Date Value  . Sodium 07/18/2012 138   . Potassium 07/18/2012 3.5   . Chloride 07/18/2012 103   . CO2 07/18/2012 29   . Glucose, Bld 07/18/2012 110*  . BUN 07/18/2012 7   . Creatinine, Ser 07/18/2012 0.59   . Calcium  07/18/2012 9.0   . GFR calc non Af Amer 07/18/2012 >90   . GFR calc Af Amer 07/18/2012 >90   . WBC 07/18/2012 9.8   . RBC 07/18/2012 4.10   . Hemoglobin 07/18/2012 13.2   . HCT 07/18/2012 37.3   . MCV 07/18/2012 91.0   . [facility] 07/18/2012 32.2   . MCHC 07/18/2012 35.4   . RDW 07/18/2012 12.8   . Platelets 07/18/2012 278   . Neutrophils Relative 07/18/2012 67   . Neutro Abs 07/18/2012 6.5   . Lymphocytes Relative 07/18/2012 22   . Lymphs Abs 07/18/2012 2.1   . Monocytes Relative 07/18/2012 10   . Monocytes Absolute 07/18/2012 1.0   . Eosinophils Relative 07/18/2012 2   . Eosinophils Absolute 07/18/2012 0.2   . Basophils Relative 07/18/2012 0   . Basophils Absolute 07/18/2012 0.0   . Troponin I 07/18/2012 <0.30   . Troponin I 07/18/2012 <0.30      Results for this Opt Visit:     Results for orders placed during the hospital encounter of 07/18/12  BASIC METABOLIC PANEL      Component Value Range   Sodium 138  135 - 145 mEq/L   Potassium 3.5  3.5 - 5.1 mEq/L   Chloride 103  96 - 112 mEq/L   CO2 29  19 - 32 mEq/L   Glucose, Bld 110 (*) 70 - 99 mg/dL   BUN 7  6 - 23 mg/dL   Creatinine, Ser 4.25  0.50 - 1.10 mg/dL   Calcium 9.0  8.4 - 95.6 mg/dL   GFR calc non Af Amer >90  >90 mL/min   GFR calc Af Amer >90  >90 mL/min  CBC WITH DIFFERENTIAL      Component Value Range   WBC 9.8  4.0 - 10.5 K/uL   RBC 4.10  3.87 - 5.11 MIL/uL   Hemoglobin 13.2  12.0 - 15.0 g/dL   HCT 38.7  56.4 - 33.2 %   MCV 91.0  78.0 - 100.0 fL   MCH 32.2  26.0 - 34.0 pg   MCHC 35.4  30.0 - 36.0 g/dL   RDW 16.1  09.6 - 04.5 %   Platelets 278  150 - 400 K/uL   Neutrophils Relative 67  43 - 77 %   Neutro Abs 6.5  1.7 - 7.7 K/uL   Lymphocytes Relative 22  12 - 46 %   Lymphs Abs 2.1  0.7 - 4.0 K/uL   Monocytes Relative 10  3 - 12 %   Monocytes Absolute 1.0  0.1 - 1.0 K/uL   Eosinophils Relative 2  0 - 5 %   Eosinophils Absolute 0.2  0.0 - 0.7 K/uL   Basophils Relative 0  0 - 1 %   Basophils Absolute  0.0  0.0 - 0.1 K/uL  TROPONIN I      Component Value Range   Troponin I <0.30  <0.30 ng/mL  TROPONIN I      Component Value Range   Troponin I <0.30  <0.30 ng/mL    EKG Orders placed during the hospital encounter of 07/18/12  . EKG 12-LEAD  . EKG 12-LEAD  . ED EKG  . ED EKG  . EKG     Prior Assessment and Plan Problem List as of 08/02/2012            Cardiology Problems   Hypertension   Last Assessment & Plan Note   05/13/2012 Office Visit Signed 05/13/2012  2:58 PM by Jodelle Gross, NP    Excellent control of BP currently. She states that she notices her BP rising with anxiety most. She is trying to quit smoking. I have explained to her that smoking can cause vasodilitation and therefore increase BP.  I have encouraged her to quit smoking.  She will continue on current medication regimen. I will see her in one year unless new symptoms occur.      Other   TOBACCO ABUSE   Last Assessment & Plan Note   06/27/2011 Office Visit Signed 06/27/2011  1:50 PM by Kathlen Brunswick, MD    Patient once again reports transient discontinuation of tobacco use with resumption once she experienced stress.  I explained that multiple quit attempts may be necessary and encouraged her to persist in her efforts to discontinue cigarette smoking.  She has a prescription for Chantix given to her by Dr. Juanetta Gosling and agrees to another trial with it.    In the absence of more pressing cardiovascular issues, I will not plan a routine return visit but will be available to assist this nice woman as needed.    NARCOTIC ABUSE   PULMONARY NODULE   AUTOIMMUNE HEPATITIS   FIBROMYALGIA   Gastroesophageal reflux disease   Depression   Chest pain   Last Assessment & Plan Note   05/13/2012 Office Visit Signed 05/13/2012  3:00 PM by Jodelle Gross, NP    She is having occasional discomfort in her chest but it is not related to activity. Use of NTG is intermittent. Will not place on long acting nitrate at  this time.  Cloudy etiology with multiple reasons, to include anxiety, fibromyalgia.  Will continue to monitor this. Will see her in one year unless symptomatic.    Fasting hyperglycemia   IBS (irritable bowel syndrome)   Weakness   Hepatitis, autoimmune   Sprain of foot       Imaging: Dg Chest 2 View  07/18/2012  *RADIOLOGY REPORT*  Clinical Data: Chest pain.  CHEST - 2 VIEW  Comparison: Chest  CT 06/23/2011.  Findings: Previously seen pulmonary nodule is not resolved by radiography.  Cardiopericardial silhouette within normal limits. Mediastinal contours normal. Trachea midline.  No airspace disease or effusion. Cholecystectomy clips are present in the right upper quadrant.  IMPRESSION: No active cardiopulmonary disease.   Original Report Authenticated By: Andreas Newport, M.D.      [facility] Calculation: Score not calculated. Missing: Total Cholesterol

## 2012-08-02 NOTE — Progress Notes (Signed)
HPI: Lindsay Walsh is a 42 y/o patient of Dr.Nishan we are seeing for ongoing assessment and treatment of recurrent chest pain. She has had several cardiac studies and has been ruled out for CAD, most recent stress test was completed in 2013 which was negative for ischemia. She was seen in the ER after becoming angry with someone and experiencing chest pain. She states that the pain began substernal and radiated into her axilla. She took NTG which relieved the pain. However, the next morning, the pain reoccurred. In ER she was ruled out for ACS. She was provided with oxycodone 5/325 once and asked to follow up with cardiology. She has had no further complaints of chest pain.    She has a long history of anxiety, depression, fibromyalgia on opiate dependence being seen by pain management, ADD, BiPolar and is on multiple medications for treatment.   Allergies  Allergen Reactions  . Doxycycline Shortness Of Breath and Rash  . Fentanyl Shortness Of Breath, Itching and Other (See Comments)    Heart racing, SOB  . Iohexol Other (See Comments)    Knots on body   . Ivp Dye (Iodinated Diagnostic Agents) Other (See Comments)    Knots on body  . Wellbutrin (Bupropion Hcl) Other (See Comments)    unknown  . Tape Rash    Current Outpatient Prescriptions  Medication Sig Dispense Refill  . amphetamine-dextroamphetamine (ADDERALL) 20 MG tablet Take 1 tablet (20 mg total) by mouth daily.  30 tablet  0  . Ascorbic Acid (VITAMIN C) 1000 MG tablet Take 1,000 mg by mouth every morning.       Marland Kitchen azaTHIOprine (IMURAN) 50 MG tablet Take 50 mg by mouth daily.       . Biotin 2500 MCG CAPS Take 2,500 mcg by mouth every morning.       . calcium-vitamin D (OS-CAL 500 + D) 500-200 MG-UNIT per tablet Take 1 tablet by mouth 2 (two) times daily.       . cyanocobalamin 2000 MCG tablet Take 1,500 mcg by mouth daily. Patient is taking 1500 mcg daily.      Marland Kitchen dicyclomine (BENTYL) 10 MG capsule Take 10 mg by mouth 3 (three)  times daily as needed. For cramps      . DULoxetine (CYMBALTA) 60 MG capsule Take 1 capsule (60 mg total) by mouth daily.  30 capsule  1  . esomeprazole (NEXIUM) 40 MG capsule Take 40 mg by mouth every morning.      Marland Kitchen estradiol (CLIMARA - DOSED IN MG/24 HR) 0.075 mg/24hr Place 1 patch onto the skin once a week.        Marland Kitchen ESZOPICLONE 3 MG tablet Take 3 mg by mouth at bedtime as needed. For sleep      . furosemide (LASIX) 40 MG tablet Take 40 mg by mouth daily.       Marland Kitchen GARLIC PO Take 4,098 mg by mouth daily.       Marland Kitchen levETIRAcetam (KEPPRA) 750 MG tablet Take 750 mg by mouth at bedtime.       . lidocaine (LIDODERM) 5 % Place 1 patch onto the skin daily. Remove & Discard patch within 12 hours or as directed by MD      . methadone (DOLOPHINE) 5 MG tablet Take 5 mg by mouth 4 (four) times daily as needed. For pain      . methocarbamol (ROBAXIN) 500 MG tablet Take 500 mg by mouth daily as needed. For spasm      .  metoprolol succinate (TOPROL-XL) 25 MG 24 hr tablet Take 75 mg by mouth every morning.      . multivitamin-iron-minerals-folic acid (CENTRUM) chewable tablet Chew 1 tablet by mouth daily.        Marland Kitchen NITROSTAT 0.4 MG SL tablet place 1 tablet under the tongue if needed every 5 minutes for chest pain for 3 doses IF NO RELIEF AFTER 3RD DOSE CALL PRESCRIBER OR 911.  25 tablet  6  . permethrin (ELIMITE) 5 % cream       . pramipexole (MIRAPEX) 0.25 MG tablet Take 0.25 mg by mouth at bedtime as needed. For restless legs      . prazosin (MINIPRESS) 2 MG capsule       . Probiotic Product (ALIGN PO) Take 1 capsule by mouth daily.       . isosorbide mononitrate (IMDUR) 15 mg TB24 Take 0.5 tablets (15 mg total) by mouth daily.  30 each  11    Past Medical History  Diagnosis Date  . Gastroesophageal reflux disease     Chronic abdominal pain; gastroparesis; globus hystericus; irritable bowel syndrome  . Hypertension   . Depression   . Fibromyalgia   . Chest pain     Normal stress echo in 2011; PVCs;  pedal edema  . Tobacco abuse     one pack per day; 35 pack years  . Overweight   . Narcotic dependence   . Pulmonary nodule   . Fasting hyperglycemia   . Shortness of breath     with exertion  . Sleep apnea   . Autoimmune hepatitis   . Irritable bowel syndrome   . Arthritis     back  . Angina     took SL nitro one week ago  . Dysrhythmia     palpatations  . PTSD (post-traumatic stress disorder)     Past Surgical History  Procedure Date  . Upper gastrointestinal endoscopy 09/13/2010  . Liver biopsy     x4  . Total abdominal hysterectomy w/ bilateral salpingoophorectomy   . Cesarean section     X3  . Tubal ligation     Bilateral  . Abdominal hysterectomy   . Cholecystectomy   . Bladder suspension 09/12/2011    Procedure: TRANSVAGINAL TAPE (TVT) PROCEDURE;  Surgeon: Ky Barban, MD;  Location: [facility];  Service: Urology;  Laterality: N/A;  . Lumbar epidural injection 01/2012    [id number] of systems complete and found to be negative unless listed above  PHYSICAL EXAM BP 131/89  Pulse 77  Wt 189 lb (85.73 kg)  General: Well developed, well nourished, in no acute distress Head: Eyes PERRLA, No xanthomas.   Normal cephalic and atramatic  Lungs: Clear bilaterally to auscultation and percussion. Heart: HRRR S1 S2, without MRG.  Pulses are 2+ & equal.            No carotid bruit. No JVD.  No abdominal bruits. No femoral bruits. Abdomen: Bowel sounds are positive, abdomen soft and non-tender without masses or                  Hernia's noted. Msk:  Back normal, normal gait. Normal strength and tone for age .Pain is reproducible to mild palpation of the sternum and left chest area. Extremities: No clubbing, cyanosis or edema.  DP +1 Neuro: Alert and oriented X 3. Psych:  Flat affect, responds appropriately  :  ASSESSMENT AND PLAN

## 2012-08-02 NOTE — Patient Instructions (Addendum)
Your physician recommends that you schedule a follow-up appointment in: 3 months  Your physician has recommended you make the following change in your medication:  1 - Start Imdur 30 mg daily - Addendum- as pharmacy will not split these tablet due to the fact that they are extended release nitrate tabs, therefore will not take 15 mg.  Lindsay Walsh notified and has agreed with change.  Patient notified.

## 2012-08-14 ENCOUNTER — Ambulatory Visit (HOSPITAL_COMMUNITY): Payer: Self-pay | Admitting: Psychiatry

## 2012-08-21 ENCOUNTER — Ambulatory Visit (HOSPITAL_COMMUNITY): Payer: Self-pay | Admitting: Psychiatry

## 2012-09-06 ENCOUNTER — Telehealth (HOSPITAL_COMMUNITY): Payer: Self-pay | Admitting: Psychiatry

## 2012-09-06 MED ORDER — DULOXETINE HCL 60 MG PO CPEP: 60.0000 mg | ORAL_CAPSULE | Freq: Every day | ORAL | Status: DC

## 2012-09-06 NOTE — Telephone Encounter (Signed)
Completed.

## 2012-09-18 ENCOUNTER — Ambulatory Visit (INDEPENDENT_AMBULATORY_CARE_PROVIDER_SITE_OTHER): Payer: 59 | Admitting: Psychiatry

## 2012-09-18 ENCOUNTER — Encounter (HOSPITAL_COMMUNITY): Payer: Self-pay | Admitting: Psychiatry

## 2012-09-18 VITALS — Wt 186.4 lb

## 2012-09-18 DIAGNOSIS — F329 Major depressive disorder, single episode, unspecified: Secondary | ICD-10-CM

## 2012-09-18 MED ORDER — DULOXETINE HCL 60 MG PO CPEP: 60.0000 mg | ORAL_CAPSULE | Freq: Every day | ORAL | Status: DC

## 2012-09-18 MED ORDER — AMPHETAMINE-DEXTROAMPHETAMINE 20 MG PO TABS: ORAL_TABLET | ORAL | Status: DC

## 2012-09-18 NOTE — Patient Instructions (Addendum)
Save an empty pack form yesterday and mark it as the "spare pack". Take a cigarette out of the pack you are using and put it in the spare pack.  Do this each day until you have saved up a whole pack of cigarettes in the spare pack.  Then pull two cigarettes out of your daily pack and put them in a empty pack from some time ago.  Do this until you have saved up another whole pack a couple of times.  Then do the same until you get down to only a HALF a pack a day.  This is saving you some on cigarettes each day and helping you to make a conscious effort to cut back each day.  When you get to a half a pack a day then you will need to save your empty packs about every other day or so and mark them as the "spare pack".  This is helping you see that you are making progress.  Put a quarter in a piggy bank for each cigarette that you put back!  That money will be used for a nice vacation or a hot tub or something for yourself.  Strongly consider attending at least 6 [employer] Meetings to help you learn about how your helping others to the exclusion of helping yourself is actually hurting yourself and is actually an addiction to fixing others and that you need to work the 12 Step to Happiness through the [employer]. Al-Anon Family Groups could be helpful with how to deal with substance abusing family and friends. Or your own issues of being in victim role.  There are only 40 Alanon Family Group meetings a week here in [place].  Online are current listing of those meetings @ [email or web address]  There are [employer].  Search on line and there you can learn the format and can access the schedule for yourself.  Their number is [phone]  CUT BACK/CUT OUT on sugar and carbohydrates, that means very limited fruits and starchy vegetables and very limited grains, breads  The goal is low GLYCEMIC INDEX.  CUT OUT all wheat, rye, or barley for the GLUTEN in them.  HIGH fat and LOW  carbohydrate diet is the KEY.  Eat avocados, eggs, lean meat like grass fed beef and chicken  Nuts and seeds would be good foods as well.   Stevia is an excellent sweetener.  Safe for the brain.   Lowella Grip is also a good safe sweetener.  Almond butter is awesome.  Check out all this on the Internet.  Dr Heber Stevenson is on the Internet with some good info about this.   Yoga is a very helpful exercise method.  On TV, on line, or by DVD Adelfa Koh is a source of high quality information about yoga and videos on yoga.  Renee Ramus is the world's number one video yoga instructor according to some experts.  There are exceptional health benefits that can be achieved through yoga.  The main principles of yoga is acceptance, no competition, no comparison, and no judgement.  It is exceptional in helping people meditate and get to a very relaxed state.   Positions that may be helpful for relaxing your back include monkey, plank and side plank.  Lying spinal twist is also very helpful.     Call if problems or concerns.
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
## 2012-09-24 ENCOUNTER — Ambulatory Visit (INDEPENDENT_AMBULATORY_CARE_PROVIDER_SITE_OTHER): Payer: Self-pay | Admitting: Internal Medicine

## 2012-09-30 ENCOUNTER — Ambulatory Visit (INDEPENDENT_AMBULATORY_CARE_PROVIDER_SITE_OTHER): Payer: PRIVATE HEALTH INSURANCE | Admitting: Internal Medicine

## 2012-09-30 ENCOUNTER — Encounter (INDEPENDENT_AMBULATORY_CARE_PROVIDER_SITE_OTHER): Payer: Self-pay | Admitting: Internal Medicine

## 2012-09-30 VITALS — BP 102/68 | HR 76 | Temp 97.3°F | Resp 20 | Ht 60.0 in | Wt 189.5 lb

## 2012-09-30 DIAGNOSIS — K589 Irritable bowel syndrome without diarrhea: Secondary | ICD-10-CM

## 2012-09-30 DIAGNOSIS — K754 Autoimmune hepatitis: Secondary | ICD-10-CM

## 2012-09-30 DIAGNOSIS — K219 Gastro-esophageal reflux disease without esophagitis: Secondary | ICD-10-CM

## 2012-09-30 MED ORDER — DICYCLOMINE HCL 10 MG PO CAPS: 10.0000 mg | ORAL_CAPSULE | Freq: Three times a day (TID) | ORAL | Status: DC

## 2012-09-30 NOTE — Progress Notes (Signed)
Presenting complaint;  Followup for autoimmune hepatitis, GERD and IBS.  Subjective:  Patient is 42 year old Caucasian female who is here for scheduled visit. She was last in July 2013. She states she has an appointment to see Dr. Iva Boop [facility] next month. She was seen in emergency room on 07/18/2012 but did not have LFTs. Her CBC and metabolic 7 are normal. Since then she's been seen at cardiology clinic and most of her pain is felt to be musculoskeletal. According to Ms. Metta Clines NPs note cardiac studies in the past have been negative. She says her heartburn is well controlled with therapy. She rarely has regurgitation nausea or vomiting. Her appetite is very good. She has craving for sweets all the time and seemed to be concerned. Her weight has gone up by 2 pounds since her last visit 9 months ago. She remains with intermittent diarrhea. She says she has diarrhea 3-4 days each week. On some days she has 10-15 watery stools. She denies melena or rectal bleeding. She continues to complain of low back pain as well as pain in her lower extremities.  Current Medications: Current Outpatient Prescriptions  Medication Sig Dispense Refill  . amphetamine-dextroamphetamine (ADDERALL) 20 MG tablet Take 1 and 1/4 tab a day  38 tablet  0  . Ascorbic Acid (VITAMIN C) 1000 MG tablet Take 1,000 mg by mouth every morning.       Marland Kitchen azaTHIOprine (IMURAN) 50 MG tablet Take 50 mg by mouth daily.       . Biotin 2500 MCG CAPS Take 2,500 mcg by mouth every morning.       . calcium-vitamin D (OS-CAL 500 + D) 500-200 MG-UNIT per tablet Take 1 tablet by mouth 2 (two) times daily.       . cyanocobalamin 2000 MCG tablet Take 1,500 mcg by mouth daily. Patient is taking 1500 mcg daily.      Marland Kitchen dicyclomine (BENTYL) 10 MG capsule Take 10 mg by mouth 3 (three) times daily as needed. For cramps      . DULoxetine (CYMBALTA) 60 MG capsule Take 1 capsule (60 mg total) by mouth daily.  30 capsule  1  . esomeprazole  (NEXIUM) 40 MG capsule Take 40 mg by mouth every morning.      Marland Kitchen estradiol (CLIMARA - DOSED IN MG/24 HR) 0.075 mg/24hr Place 1 patch onto the skin once a week.        Marland Kitchen ESZOPICLONE 3 MG tablet Take 3 mg by mouth at bedtime as needed. For sleep      . furosemide (LASIX) 40 MG tablet Take 40 mg by mouth daily.       Marland Kitchen gabapentin (NEURONTIN) 300 MG capsule Take 300 mg by mouth 3 (three) times daily.      Marland Kitchen GARLIC PO Take 9,811 mg by mouth daily.       . isosorbide mononitrate (IMDUR) 30 MG 24 hr tablet Take 1 tablet (30 mg total) by mouth daily.  30 each  11  . levETIRAcetam (KEPPRA) 750 MG tablet Take 750 mg by mouth at bedtime.       . lidocaine (LIDODERM) 5 % Place 1 patch onto the skin daily. Remove & Discard patch within 12 hours or as directed by MD      . lisinopril (PRINIVIL,ZESTRIL) 40 MG tablet Take 40 mg by mouth daily.       . methadone (DOLOPHINE) 5 MG tablet Take 5 mg by mouth 4 (four) times daily as needed. For pain      .  methocarbamol (ROBAXIN) 500 MG tablet Take 500 mg by mouth daily as needed. For spasm      . metoprolol succinate (TOPROL-XL) 25 MG 24 hr tablet Take 75 mg by mouth every morning.      . multivitamin-iron-minerals-folic acid (CENTRUM) chewable tablet Chew 1 tablet by mouth daily.        . pramipexole (MIRAPEX) 0.25 MG tablet Take 0.25 mg by mouth at bedtime as needed. For restless legs      . prazosin (MINIPRESS) 2 MG capsule Take 2 mg by mouth at bedtime.       . Probiotic Product (ALIGN PO) Take 1 capsule by mouth daily.        No current facility-administered medications for this visit.     Objective: Blood pressure 102/68, pulse 76, temperature 97.3 F (36.3 C), temperature source Oral, resp. rate 20, height 5' (1.524 m), weight 189 lb 8 oz (85.957 kg). Patient is alert and in no acute distress. Conjunctiva is pink. Sclera is nonicteric Oropharyngeal mucosa is normal. No neck masses or thyromegaly noted. Cardiac exam with regular rhythm normal S1 and  S2. No murmur or gallop noted. Lungs are clear to auscultation. Abdomen. Bowel sounds are normal. Abdomen is soft and nontender without organomegaly or masses  No LE edema or clubbing noted.  Labs/studies Results: CBC from 07/18/2012 within normal limits. LFTs from 01/10/2012. Bilirubin oh 0.3, AP 94, 8 AST 14, ALT 11, protein 7.0 and albumin 4.1    Assessment:  #1. Autoimmune hepatitis diagnosed 10 years ago. She remains in remission on low-dose azathioprine. She will continue periodic followup with Dr. [name] of [facility]. #2. GERD. Symptoms well controlled with therapy. I doubt that her chest pain is secondary to GERD. #3. Irritable bowel syndrome. Since she has frequent diarrhea she needs to take dicyclomine on regular basis rather than when necessary..    Plan:  Patient will go to the lab for comprehensive chemistry panel. New prescription for dicyclomine 10 mg by mouth 3 times a day given. Patient will be contacted with results of blood work. Office visit in 6 months.

## 2012-09-30 NOTE — Patient Instructions (Signed)
Physician will contact you with results of blood test when available.

## 2012-10-17 ENCOUNTER — Telehealth (HOSPITAL_COMMUNITY): Payer: Self-pay

## 2012-10-18 ENCOUNTER — Encounter (HOSPITAL_COMMUNITY): Payer: Self-pay | Admitting: Psychiatry

## 2012-10-18 ENCOUNTER — Ambulatory Visit (INDEPENDENT_AMBULATORY_CARE_PROVIDER_SITE_OTHER): Payer: PRIVATE HEALTH INSURANCE | Admitting: Psychiatry

## 2012-10-18 ENCOUNTER — Telehealth (HOSPITAL_COMMUNITY): Payer: Self-pay | Admitting: Psychiatry

## 2012-10-18 VITALS — Wt 194.6 lb

## 2012-10-18 DIAGNOSIS — F515 Nightmare disorder: Secondary | ICD-10-CM

## 2012-10-18 DIAGNOSIS — F4310 Post-traumatic stress disorder, unspecified: Secondary | ICD-10-CM | POA: Insufficient documentation

## 2012-10-18 DIAGNOSIS — F329 Major depressive disorder, single episode, unspecified: Secondary | ICD-10-CM

## 2012-10-18 DIAGNOSIS — IMO0001 Reserved for inherently not codable concepts without codable children: Secondary | ICD-10-CM

## 2012-10-18 DIAGNOSIS — F17200 Nicotine dependence, unspecified, uncomplicated: Secondary | ICD-10-CM

## 2012-10-18 MED ORDER — AMPHETAMINE-DEXTROAMPHETAMINE 20 MG PO TABS: ORAL_TABLET | ORAL | Status: DC

## 2012-10-18 MED ORDER — PRAZOSIN HCL 2 MG PO CAPS: 2.0000 mg | ORAL_CAPSULE | Freq: Every day | ORAL | Status: DC

## 2012-10-18 MED ORDER — LIDOCAINE 5 % EX PTCH: 1.0000 | MEDICATED_PATCH | CUTANEOUS | Status: DC

## 2012-10-18 MED ORDER — METHOCARBAMOL 750 MG PO TABS: 750.0000 mg | ORAL_TABLET | Freq: Every day | ORAL | Status: DC | PRN

## 2012-10-18 MED ORDER — DULOXETINE HCL 60 MG PO CPEP: 60.0000 mg | ORAL_CAPSULE | Freq: Every day | ORAL | Status: DC

## 2012-10-18 NOTE — Patient Instructions (Signed)
Set a timer for 8 minutes and walk for that amount of time in the house or in the yard.  Mark "8" on a calendar for that day.  Do that every day this week.  Then next week increase the time to 9 minutes and then mark the calendar with a 9 for that day.  Each week increase your exercise by one minute.  Keep a record of this so you can see what progress you are making.  Do this every day, just like eating and sleeping.  It is good for pain control, depression, and for your soul/spirit.  Bring the record in for your next visit so we can talk about your effort and how you feel with the new exercise program going and working for you.  CUT BACK/CUT OUT on sugar and carbohydrates, that means very limited fruits and starchy vegetables and very limited grains, breads  The goal is low GLYCEMIC INDEX.  CUT OUT all wheat, rye, or barley for the GLUTEN in them.  HIGH fat and LOW carbohydrate diet is the KEY.  Eat avocados, eggs, lean meat like grass fed beef and chicken  Nuts and seeds would be good foods as well.   Stevia is an excellent sweetener.  Safe for the brain.   Lowella Grip is also a good safe sweetener, not the baking blend form of Truvia  Almond butter is awesome.  Check out all this on the Internet.  Dr Heber [place] is on the Internet with some good info about this.   http://www.drperlmutter.com is where that is.  An excellent site for info on this diet is http://paleoleap.com  [employer] makes dark chocolate that is sweetened with Stevia that is safe.  Take care of yourself.  No one else is standing up to do the job and only you know what you need.   GET SERIOUS about taking care of yourself.  Do the next right thing and that often means doing something to care for yourself along the lines of are you hungry, are you angry, are you lonely, are you tired, are you scared?  [employer] is what that stands for.  Call if problems or concerns.
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
## 2012-10-18 NOTE — Telephone Encounter (Signed)
Pt scheduled for today.  

## 2012-10-21 LAB — COMPREHENSIVE METABOLIC PANEL
AST: 18 U/L (ref 0–37)
Albumin: 3.6 g/dL (ref 3.5–5.2)
BUN: 10 mg/dL (ref 6–23)
CO2: 28 mEq/L (ref 19–32)
Calcium: 8.8 mg/dL (ref 8.4–10.5)
Chloride: 109 mEq/L (ref 96–112)
Glucose, Bld: 114 mg/dL — ABNORMAL HIGH (ref 70–99)
Potassium: 4.5 mEq/L (ref 3.5–5.3)

## 2012-10-22 ENCOUNTER — Telehealth (INDEPENDENT_AMBULATORY_CARE_PROVIDER_SITE_OTHER): Payer: Self-pay | Admitting: *Deleted

## 2012-10-22 NOTE — Telephone Encounter (Signed)
Called to get her labs results. The return phone number is [phone].

## 2012-10-23 ENCOUNTER — Telehealth (INDEPENDENT_AMBULATORY_CARE_PROVIDER_SITE_OTHER): Payer: Self-pay | Admitting: *Deleted

## 2012-10-23 DIAGNOSIS — R7309 Other abnormal glucose: Secondary | ICD-10-CM

## 2012-10-23 NOTE — Telephone Encounter (Signed)
Refer to previous note.

## 2012-10-23 NOTE — Telephone Encounter (Signed)
Per Dr.[name] may get U/A. Patient should follow up with PCP, Dr.Hawkins.

## 2012-10-23 NOTE — Telephone Encounter (Signed)
Patient was called and given her lab results. Her Glucose was elevated 114, she says that this was fasting. She also shares that she is craving sweets, has dry mouth and keepes something in her hand to drink all the time. Urine  has been dark and has a different odor to it. Patient was advised that Dr.[name] would review and he would call her back or he would have me to call her.

## 2012-10-23 NOTE — Telephone Encounter (Signed)
Per Dr.[name] we can get a U/A on patient. Lindsay Walsh should follow-up with Dr.Hawkins, patient was called and made aware. Order was faxed to [facility]

## 2012-10-24 ENCOUNTER — Other Ambulatory Visit (INDEPENDENT_AMBULATORY_CARE_PROVIDER_SITE_OTHER): Payer: Self-pay | Admitting: Internal Medicine

## 2012-10-25 LAB — URINALYSIS
Ketones, ur: NEGATIVE mg/dL
Nitrite: NEGATIVE
Specific Gravity, Urine: 1.01 (ref 1.005–1.030)
Urobilinogen, UA: 0.2 mg/dL (ref 0.0–1.0)

## 2012-10-28 ENCOUNTER — Ambulatory Visit: Payer: Self-pay | Admitting: Cardiovascular Disease

## 2012-10-30 ENCOUNTER — Ambulatory Visit (HOSPITAL_COMMUNITY): Payer: Self-pay | Admitting: Psychiatry

## 2012-11-13 ENCOUNTER — Ambulatory Visit (INDEPENDENT_AMBULATORY_CARE_PROVIDER_SITE_OTHER): Payer: 59 | Admitting: Psychiatry

## 2012-11-13 ENCOUNTER — Encounter (HOSPITAL_COMMUNITY): Payer: Self-pay | Admitting: Psychiatry

## 2012-11-13 VITALS — BP 138/82 | Ht 60.0 in | Wt 191.8 lb

## 2012-11-13 DIAGNOSIS — F17200 Nicotine dependence, unspecified, uncomplicated: Secondary | ICD-10-CM

## 2012-11-13 DIAGNOSIS — F329 Major depressive disorder, single episode, unspecified: Secondary | ICD-10-CM

## 2012-11-13 DIAGNOSIS — F4310 Post-traumatic stress disorder, unspecified: Secondary | ICD-10-CM

## 2012-11-13 DIAGNOSIS — IMO0001 Reserved for inherently not codable concepts without codable children: Secondary | ICD-10-CM

## 2012-11-13 MED ORDER — AMPHETAMINE-DEXTROAMPHETAMINE 20 MG PO TABS: ORAL_TABLET | ORAL | Status: DC

## 2012-11-13 MED ORDER — METHOCARBAMOL 750 MG PO TABS: 750.0000 mg | ORAL_TABLET | Freq: Every day | ORAL | Status: DC | PRN

## 2012-11-13 MED ORDER — DULOXETINE HCL 60 MG PO CPEP: 60.0000 mg | ORAL_CAPSULE | Freq: Every day | ORAL | Status: DC

## 2012-11-13 MED ORDER — LIDOCAINE 5 % EX PTCH: 1.0000 | MEDICATED_PATCH | CUTANEOUS | Status: DC

## 2012-11-13 MED ORDER — PRAZOSIN HCL 2 MG PO CAPS: 2.0000 mg | ORAL_CAPSULE | Freq: Every day | ORAL | Status: DC

## 2012-11-13 NOTE — Patient Instructions (Signed)
Keep it up  Please ask PCP to consider taking over the prescribing of the Lidoderm and Robaxin. Thanks  Take care of yourself.  No one else is standing up to do the job and only you know what you need.   GET SERIOUS about taking care of yourself.  Do the next right thing and that often means doing something to care for yourself along the lines of are you hungry, are you angry, are you lonely, are you tired, are you scared?  [employer] is what that stands for.  Call if problems or concerns.
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
## 2012-11-23 ENCOUNTER — Other Ambulatory Visit (INDEPENDENT_AMBULATORY_CARE_PROVIDER_SITE_OTHER): Payer: Self-pay | Admitting: Internal Medicine

## 2012-12-16 ENCOUNTER — Other Ambulatory Visit (HOSPITAL_COMMUNITY): Payer: Self-pay

## 2012-12-16 DIAGNOSIS — R0602 Shortness of breath: Secondary | ICD-10-CM

## 2012-12-25 ENCOUNTER — Encounter: Payer: Self-pay | Admitting: Cardiovascular Disease

## 2012-12-25 ENCOUNTER — Ambulatory Visit (INDEPENDENT_AMBULATORY_CARE_PROVIDER_SITE_OTHER): Payer: PRIVATE HEALTH INSURANCE | Admitting: Cardiovascular Disease

## 2012-12-25 VITALS — BP 148/97 | HR 89 | Ht 60.0 in | Wt 189.0 lb

## 2012-12-25 DIAGNOSIS — R079 Chest pain, unspecified: Secondary | ICD-10-CM

## 2012-12-25 DIAGNOSIS — F17200 Nicotine dependence, unspecified, uncomplicated: Secondary | ICD-10-CM

## 2012-12-25 DIAGNOSIS — I10 Essential (primary) hypertension: Secondary | ICD-10-CM

## 2012-12-25 NOTE — Progress Notes (Signed)
Patient ID: Lindsay Walsh, female   DOB: 1971/07/05, 42 y.o.   MRN: [id number] Mrs.Ishii comes for ongoing assessment and treatment of hypertension with history of chest pain and palpitations. She also suffers from fibromyalgia, anxiety and has ongoing tobacco abuse. She states she is taking NTG 1-2 times a week for recurrent chest pain. She states that she sometimes notices her BP going up without warning, either at rest or exertion, and sometimes with anxiety attack. Last seen by PA in 11/13  Was scarred to start chantix  I would agree that it is not a great drug to be combined with her other behavioral health issues.  Discussed stopping imdur and adderral  Don not think the latter is helping her BP any.  1ppd can use 14 mg patch  ROS: Denies fever, malais, weight loss, blurry vision, decreased visual acuity, cough, sputum, SOB, hemoptysis, pleuritic pain, palpitaitons, heartburn, abdominal pain, melena, lower extremity edema, claudication, or rash.  All other systems reviewed and negative  General: Affect appropriate Healthy:  appears stated age HEENT: normal Neck supple with no adenopathy JVP normal no bruits no thyromegaly Lungs clear with no wheezing and good diaphragmatic motion Heart:  S1/S2 no murmur, no rub, gallop or click PMI normal Abdomen: benighn, BS positve, no tenderness, no AAA no bruit.  No HSM or HJR Distal pulses intact with no bruits No edema Neuro non-focal Skin warm and dry No muscular weakness   Current Outpatient Prescriptions  Medication Sig Dispense Refill  . amphetamine-dextroamphetamine (ADDERALL) 20 MG tablet Take 1 and 1/4 tab a day  38 tablet  0  . Ascorbic Acid (VITAMIN C) 1000 MG tablet Take 1,000 mg by mouth every morning.       Marland Kitchen azaTHIOprine (IMURAN) 50 MG tablet take 1 tablet by mouth once daily  30 tablet  5  . Biotin 2500 MCG CAPS Take 2,500 mcg by mouth every morning.       . calcium-vitamin D (OS-CAL 500 + D) 500-200 MG-UNIT per tablet Take  1 tablet by mouth 2 (two) times daily.       . cyanocobalamin 2000 MCG tablet Take 1,500 mcg by mouth daily. Patient is taking 1500 mcg daily.      Marland Kitchen dicyclomine (BENTYL) 10 MG capsule Take 1 capsule (10 mg total) by mouth 3 (three) times daily before meals. For cramps  90 capsule  5  . DULoxetine (CYMBALTA) 60 MG capsule Take 1 capsule (60 mg total) by mouth daily.  30 capsule  2  . esomeprazole (NEXIUM) 40 MG capsule Take 40 mg by mouth every morning.      Marland Kitchen estradiol (CLIMARA - DOSED IN MG/24 HR) 0.075 mg/24hr Place 1 patch onto the skin once a week.        Marland Kitchen ESZOPICLONE 3 MG tablet Take 3 mg by mouth at bedtime as needed. For sleep      . furosemide (LASIX) 40 MG tablet Take 40 mg by mouth daily.       Marland Kitchen gabapentin (NEURONTIN) 300 MG capsule Take 300 mg by mouth 3 (three) times daily.      Marland Kitchen GARLIC PO Take 4,098 mg by mouth daily.       . isosorbide mononitrate (IMDUR) 30 MG 24 hr tablet Take 1 tablet (30 mg total) by mouth daily.  30 each  11  . levETIRAcetam (KEPPRA) 750 MG tablet Take 750 mg by mouth at bedtime.       . lidocaine (LIDODERM) 5 % Place 1  patch onto the skin daily. Remove & Discard patch within 12 hours or as directed by MD  30 patch  1  . lisinopril (PRINIVIL,ZESTRIL) 40 MG tablet Take 40 mg by mouth daily.       . methadone (DOLOPHINE) 5 MG tablet Take 5 mg by mouth 4 (four) times daily as needed. For pain      . methocarbamol (ROBAXIN) 750 MG tablet Take 1 tablet (750 mg total) by mouth daily as needed. For spasm  90 tablet  1  . metoprolol succinate (TOPROL-XL) 25 MG 24 hr tablet Take 75 mg by mouth every morning.      Marland Kitchen NEXIUM 40 MG capsule take 1 tablet every morning  30 capsule  11  . prazosin (MINIPRESS) 2 MG capsule Take 1 capsule (2 mg total) by mouth at bedtime.  30 capsule  2  . PROAIR HFA 108 (90 BASE) MCG/ACT inhaler       . Probiotic Product (ALIGN PO) Take 1 capsule by mouth daily.       . multivitamin-iron-minerals-folic acid (CENTRUM) chewable tablet Chew 1  tablet by mouth daily.        . pramipexole (MIRAPEX) 0.25 MG tablet Take 0.25 mg by mouth at bedtime as needed. For restless legs       No current facility-administered medications for this visit.    Allergies  Doxycycline; Fentanyl; Iohexol; Ivp dye; Wellbutrin; and Tape  Electrocardiogram:  07/19/12  SR rate 76 normal ECG   Assessment and Plan

## 2012-12-25 NOTE — Patient Instructions (Addendum)
Your physician recommends that you schedule a follow-up appointment in: 6 months  

## 2012-12-25 NOTE — Assessment & Plan Note (Signed)
Would not use chantix given other psychoacitve meds.  Discussed nicorette gum and patches.

## 2012-12-25 NOTE — Assessment & Plan Note (Signed)
Atypical not likely cardiac Fairly resolved Normal ECG  Discussed stopping imdur

## 2012-12-25 NOTE — Assessment & Plan Note (Signed)
Stable would try to get off adderrall and if she can stop smoking this would help Continue ACE and beta blocker

## 2013-01-01 ENCOUNTER — Ambulatory Visit (HOSPITAL_COMMUNITY)
Admission: RE | Admit: 2013-01-01 | Discharge: 2013-01-01 | Disposition: A | Discharge status: Home / Self Care | Payer: PRIVATE HEALTH INSURANCE | Source: Ambulatory Visit | Attending: Pulmonary Disease | Admitting: Pulmonary Disease

## 2013-01-01 DIAGNOSIS — R0602 Shortness of breath: Secondary | ICD-10-CM | POA: Insufficient documentation

## 2013-01-01 LAB — BLOOD GAS, ARTERIAL
Bicarbonate: 23.1 mEq/L (ref 20.0–24.0)
O2 Saturation: 97 %
pO2, Arterial: 89.7 mmHg (ref 80.0–100.0)

## 2013-01-02 NOTE — Procedures (Signed)
[name], Lindsay Walsh              ACCOUNT NO.:  [id number]  MEDICAL RECORD NO.:  [id number]  LOCATION:  RESP                          FACILITY:  [facility]  PHYSICIAN:  Juri Dinning [name], M.D.DATE OF BIRTH:  [birth year]  DATE OF PROCEDURE: DATE OF DISCHARGE:  01/01/2013                           PULMONARY FUNCTION TEST   Reason for pulmonary function testing is shortness of breath. 1. Spirometry shows no ventilatory defect and no evidence of airflow     obstruction. 2. Lung volumes showed some air trapping. 3. DLCO is normal. 4. Airway resistance is normal. 5. Although she has a significant smoking history, her pulmonary     function test does not show a pulmonary cause of shortness of     breath.     [name] [name], M.D.     ELH/MEDQ  D:  01/01/2013  T:  01/02/2013  Job:  [id number]

## 2013-01-13 LAB — PULMONARY FUNCTION TEST

## 2013-01-14 ENCOUNTER — Ambulatory Visit (HOSPITAL_COMMUNITY): Payer: Self-pay | Admitting: Psychiatry

## 2013-01-16 ENCOUNTER — Encounter (HOSPITAL_COMMUNITY): Payer: Self-pay | Admitting: Psychiatry

## 2013-01-16 ENCOUNTER — Ambulatory Visit (INDEPENDENT_AMBULATORY_CARE_PROVIDER_SITE_OTHER): Payer: PRIVATE HEALTH INSURANCE | Admitting: Psychiatry

## 2013-01-16 VITALS — BP 157/86 | HR 75 | Wt 190.0 lb

## 2013-01-16 DIAGNOSIS — F329 Major depressive disorder, single episode, unspecified: Secondary | ICD-10-CM

## 2013-01-16 DIAGNOSIS — IMO0001 Reserved for inherently not codable concepts without codable children: Secondary | ICD-10-CM

## 2013-01-16 DIAGNOSIS — F515 Nightmare disorder: Secondary | ICD-10-CM

## 2013-01-16 DIAGNOSIS — F4310 Post-traumatic stress disorder, unspecified: Secondary | ICD-10-CM

## 2013-01-16 MED ORDER — DULOXETINE HCL 60 MG PO CPEP: 60.0000 mg | ORAL_CAPSULE | Freq: Every day | ORAL | Status: DC

## 2013-01-16 MED ORDER — PRAZOSIN HCL 2 MG PO CAPS: 2.0000 mg | ORAL_CAPSULE | Freq: Every day | ORAL | Status: DC

## 2013-01-16 MED ORDER — AMPHETAMINE-DEXTROAMPHETAMINE 10 MG PO TABS: 10.0000 mg | ORAL_TABLET | Freq: Every day | ORAL | Status: DC
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
## 2013-03-04 ENCOUNTER — Emergency Department (HOSPITAL_COMMUNITY): Payer: PRIVATE HEALTH INSURANCE

## 2013-03-04 ENCOUNTER — Other Ambulatory Visit: Payer: Self-pay

## 2013-03-04 ENCOUNTER — Encounter (HOSPITAL_COMMUNITY): Payer: Self-pay | Admitting: *Deleted

## 2013-03-04 ENCOUNTER — Emergency Department (HOSPITAL_COMMUNITY)
Admission: EM | Admit: 2013-03-04 | Discharge: 2013-03-04 | Disposition: A | Discharge status: Home / Self Care | Payer: PRIVATE HEALTH INSURANCE | Attending: Emergency Medicine | Admitting: Emergency Medicine

## 2013-03-04 DIAGNOSIS — R072 Precordial pain: Secondary | ICD-10-CM | POA: Diagnosis not present

## 2013-03-04 DIAGNOSIS — E663 Overweight: Secondary | ICD-10-CM | POA: Insufficient documentation

## 2013-03-04 DIAGNOSIS — I499 Cardiac arrhythmia, unspecified: Secondary | ICD-10-CM | POA: Insufficient documentation

## 2013-03-04 DIAGNOSIS — Z8719 Personal history of other diseases of the digestive system: Secondary | ICD-10-CM | POA: Insufficient documentation

## 2013-03-04 DIAGNOSIS — R55 Syncope and collapse: Secondary | ICD-10-CM | POA: Diagnosis not present

## 2013-03-04 DIAGNOSIS — F3289 Other specified depressive episodes: Secondary | ICD-10-CM | POA: Insufficient documentation

## 2013-03-04 DIAGNOSIS — Z8659 Personal history of other mental and behavioral disorders: Secondary | ICD-10-CM | POA: Insufficient documentation

## 2013-03-04 DIAGNOSIS — F329 Major depressive disorder, single episode, unspecified: Secondary | ICD-10-CM | POA: Insufficient documentation

## 2013-03-04 DIAGNOSIS — Z8739 Personal history of other diseases of the musculoskeletal system and connective tissue: Secondary | ICD-10-CM | POA: Insufficient documentation

## 2013-03-04 DIAGNOSIS — K219 Gastro-esophageal reflux disease without esophagitis: Secondary | ICD-10-CM | POA: Insufficient documentation

## 2013-03-04 DIAGNOSIS — R42 Dizziness and giddiness: Secondary | ICD-10-CM | POA: Diagnosis not present

## 2013-03-04 DIAGNOSIS — F17200 Nicotine dependence, unspecified, uncomplicated: Secondary | ICD-10-CM | POA: Diagnosis not present

## 2013-03-04 DIAGNOSIS — F4310 Post-traumatic stress disorder, unspecified: Secondary | ICD-10-CM | POA: Insufficient documentation

## 2013-03-04 DIAGNOSIS — Z8679 Personal history of other diseases of the circulatory system: Secondary | ICD-10-CM | POA: Insufficient documentation

## 2013-03-04 DIAGNOSIS — Z79899 Other long term (current) drug therapy: Secondary | ICD-10-CM | POA: Insufficient documentation

## 2013-03-04 DIAGNOSIS — R079 Chest pain, unspecified: Secondary | ICD-10-CM | POA: Diagnosis present

## 2013-03-04 DIAGNOSIS — I10 Essential (primary) hypertension: Secondary | ICD-10-CM | POA: Insufficient documentation

## 2013-03-04 LAB — BASIC METABOLIC PANEL
BUN: 7 mg/dL (ref 6–23)
Calcium: 9.6 mg/dL (ref 8.4–10.5)
Creatinine, Ser: 0.6 mg/dL (ref 0.50–1.10)
GFR calc non Af Amer: >90 mL/min (ref >90)
Glucose, Bld: 128 mg/dL — ABNORMAL HIGH (ref 70–99)

## 2013-03-04 LAB — CBC WITH DIFFERENTIAL/PLATELET
Eosinophils Absolute: 0.1 10*3/uL (ref 0.0–0.7)
Eosinophils Relative: 1 % (ref 0–5)
Hemoglobin: 12.9 g/dL (ref 12.0–15.0)
Lymphs Abs: 2.1 10*3/uL (ref 0.7–4.0)
MCH: 31.8 pg (ref 26.0–34.0)
MCV: 89.9 fL (ref 78.0–100.0)
Monocytes Relative: 9 % (ref 3–12)
Platelets: 234 10*3/uL (ref 150–400)
RBC: 4.06 MIL/uL (ref 3.87–5.11)

## 2013-03-04 NOTE — ED Notes (Signed)
Patient with no complaints at this time. Respirations even and unlabored. Skin warm/dry. Discharge instructions reviewed with patient at this time. Patient given opportunity to voice concerns/ask questions. Patient discharged at this time and left Emergency Department with steady gait.   
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
## 2013-03-04 NOTE — ED Notes (Signed)
Pt states chest pain that started this morning. Pt states she became dizzy & felt like she was going to pass out.

## 2013-03-11 ENCOUNTER — Other Ambulatory Visit: Payer: Self-pay | Admitting: Cardiology

## 2013-03-11 MED ORDER — METOPROLOL SUCCINATE ER 25 MG PO TB24: 75.0000 mg | ORAL_TABLET | Freq: Every morning | ORAL | Status: DC

## 2013-03-13 ENCOUNTER — Ambulatory Visit: Payer: Self-pay | Admitting: Adult Health

## 2013-04-01 ENCOUNTER — Ambulatory Visit (INDEPENDENT_AMBULATORY_CARE_PROVIDER_SITE_OTHER): Payer: PRIVATE HEALTH INSURANCE | Admitting: Internal Medicine

## 2013-04-01 ENCOUNTER — Encounter (INDEPENDENT_AMBULATORY_CARE_PROVIDER_SITE_OTHER): Payer: Self-pay | Admitting: Internal Medicine

## 2013-04-01 VITALS — BP 108/68 | HR 76 | Temp 98.2°F | Resp 20 | Ht 60.0 in | Wt 195.0 lb

## 2013-04-01 DIAGNOSIS — K219 Gastro-esophageal reflux disease without esophagitis: Secondary | ICD-10-CM

## 2013-04-01 DIAGNOSIS — K754 Autoimmune hepatitis: Secondary | ICD-10-CM

## 2013-04-01 DIAGNOSIS — K589 Irritable bowel syndrome without diarrhea: Secondary | ICD-10-CM

## 2013-04-01 NOTE — Patient Instructions (Signed)
Please call Dr. [name] to find it if you could go back on Adderall may be a lower dose.

## 2013-04-01 NOTE — Progress Notes (Signed)
Presenting complaint;  Followup for GERD IBS and autoimmune hepatitis.  Subjective:  Patient is 42 year old Caucasian female with multiple medical problems who is for scheduled visit. She was last seen 6 months ago. She does not feel well. She feels weak and tired all the time and does not feel like doing anything. The symptoms started over 2 months ago when the Adderall was discontinued by Dr. [name] because he thought it was making her blood pressure management difficult. Patient states she was begun on this medication by Dr. Iva Boop of [facility]. She was evaluated in the emergency room on 03/04/2013 for pre-syncopal spell but workup was negative. She denies fever chills nausea vomiting melena or rectal bleeding. She also denies abdominal pain. She has heartburn normal in once or twice a month. Her bowels move 2-3 times a day. She has gained 6 pounds since her last visit.  Current Medications: Current Outpatient Prescriptions  Medication Sig Dispense Refill  . Ascorbic Acid (VITAMIN C) 1000 MG tablet Take 1,000 mg by mouth every morning.       Marland Kitchen azaTHIOprine (IMURAN) 50 MG tablet take 1 tablet by mouth once daily  30 tablet  5  . Biotin 2500 MCG CAPS Take 2,500 mcg by mouth every morning.       . calcium-vitamin D (OS-CAL 500 + D) 500-200 MG-UNIT per tablet Take 1 tablet by mouth 2 (two) times daily.       . cyanocobalamin 2000 MCG tablet Take 1,500 mcg by mouth daily. Patient is taking 1500 mcg daily.      Marland Kitchen dicyclomine (BENTYL) 10 MG capsule Take 1 capsule (10 mg total) by mouth 3 (three) times daily before meals. For cramps  90 capsule  5  . DULoxetine (CYMBALTA) 60 MG capsule Take 1 capsule (60 mg total) by mouth daily.  30 capsule  2  . esomeprazole (NEXIUM) 40 MG capsule Take 40 mg by mouth every morning.      Marland Kitchen estradiol (CLIMARA - DOSED IN MG/24 HR) 0.075 mg/24hr Place 1 patch onto the skin once a week.        Marland Kitchen ESZOPICLONE 3 MG tablet Take 3 mg by mouth at bedtime as needed. For  sleep      . furosemide (LASIX) 40 MG tablet Take 40 mg by mouth daily.       Marland Kitchen gabapentin (NEURONTIN) 300 MG capsule Take 300 mg by mouth at bedtime as needed.       Marland Kitchen GARLIC PO Take 1,610 mg by mouth daily.       . isosorbide mononitrate (IMDUR) 30 MG 24 hr tablet Take 1 tablet (30 mg total) by mouth daily.  30 each  11  . levETIRAcetam (KEPPRA) 750 MG tablet Take 750 mg by mouth at bedtime.       . lidocaine (LIDODERM) 5 % Place 1 patch onto the skin daily. Remove & Discard patch within 12 hours or as directed by MD  30 patch  1  . lisinopril (PRINIVIL,ZESTRIL) 40 MG tablet Take 40 mg by mouth daily.       . methadone (DOLOPHINE) 5 MG tablet Take 5 mg by mouth 4 (four) times daily as needed. For pain      . metoprolol succinate (TOPROL-XL) 25 MG 24 hr tablet Take 3 tablets (75 mg total) by mouth every morning.  90 tablet  6  . Multiple Vitamins-Minerals (CENTRUM) tablet Take 1 tablet by mouth daily.      . pramipexole (MIRAPEX) 0.25 MG tablet  Take 0.25 mg by mouth at bedtime as needed. For restless legs      . prazosin (MINIPRESS) 2 MG capsule Take 1 capsule (2 mg total) by mouth at bedtime.  30 capsule  2  . PROAIR HFA 108 (90 BASE) MCG/ACT inhaler Inhale 2 puffs into the lungs as needed.       . Probiotic Product (ALIGN PO) Take 1 capsule by mouth daily.        No current facility-administered medications for this visit.     Objective: Blood pressure 108/68, pulse 76, temperature 98.2 F (36.8 C), temperature source Oral, resp. rate 20, height 5' (1.524 m), weight 195 lb (88.451 kg). Patient is alert and in no acute distress. Conjunctiva is pink. Sclera is nonicteric Oropharyngeal mucosa is normal. No neck masses or thyromegaly noted. Cardiac exam with regular rhythm normal S1 and S2. No murmur or gallop noted. Lungs are clear to auscultation. Abdomen is full. It is soft and nontender without organomegaly or masses.  No LE edema or clubbing noted.  Labs/studies Results: From  03/04/2013. WBC 10.2, H&H 12.9 and 36.5 and platelet count 234K Metabolic 7 was normal except glucose of 128.  LFTs normal on 10/21/2012   Assessment:  #1. Autoimmune hepatitis. This condition was diagnosed over 10 years ago when she presented with jaundice and transaminases in 2000 range. She has been in remission for a number of years. She is also being followed by Dr. Iva Boop of [facility]. Patient is gaining weight and therefore putting herself at risk for fatty liver disease. She needs to change her eating habits and stopped exercising as soon as she can. #2. GERD. Symptoms well controlled with therapy. #3. IBS. Once again she is doing well with therapy. #4. Weakness. It is possibly related to her medications to clearly antihypertensives. She will need to check with Dr. [name] or Dr. Juanetta Gosling if she could go back on Adderall may be a lower dose.   Plan:  Continue GI meds as before. Patient advised to call Dr. Fabio Bering office and find out if she could go back on Adderall may be at a lower dose. Will not do LFTs now since she'll be having blood tests at [facility]. OV in 6 months.

## 2013-04-02 ENCOUNTER — Other Ambulatory Visit (HOSPITAL_COMMUNITY): Payer: Self-pay | Admitting: Pulmonary Disease

## 2013-04-02 DIAGNOSIS — Z139 Encounter for screening, unspecified: Secondary | ICD-10-CM

## 2013-04-14 ENCOUNTER — Ambulatory Visit (HOSPITAL_COMMUNITY)
Admission: RE | Admit: 2013-04-14 | Discharge: 2013-04-14 | Disposition: A | Discharge status: Home / Self Care | Payer: PRIVATE HEALTH INSURANCE | Source: Ambulatory Visit | Attending: Pulmonary Disease | Admitting: Pulmonary Disease

## 2013-04-14 DIAGNOSIS — Z139 Encounter for screening, unspecified: Secondary | ICD-10-CM

## 2013-04-14 DIAGNOSIS — Z1231 Encounter for screening mammogram for malignant neoplasm of breast: Secondary | ICD-10-CM | POA: Insufficient documentation

## 2013-04-17 ENCOUNTER — Ambulatory Visit (HOSPITAL_COMMUNITY): Payer: Self-pay | Admitting: Psychiatry

## 2013-04-17 ENCOUNTER — Other Ambulatory Visit: Payer: Self-pay | Admitting: Pulmonary Disease

## 2013-04-17 ENCOUNTER — Ambulatory Visit (INDEPENDENT_AMBULATORY_CARE_PROVIDER_SITE_OTHER): Payer: PRIVATE HEALTH INSURANCE | Admitting: Psychiatry

## 2013-04-17 ENCOUNTER — Encounter (HOSPITAL_COMMUNITY): Payer: Self-pay | Admitting: Psychiatry

## 2013-04-17 VITALS — BP 170/90 | Ht 60.0 in | Wt 195.0 lb

## 2013-04-17 DIAGNOSIS — F4310 Post-traumatic stress disorder, unspecified: Secondary | ICD-10-CM

## 2013-04-17 DIAGNOSIS — F329 Major depressive disorder, single episode, unspecified: Secondary | ICD-10-CM

## 2013-04-17 DIAGNOSIS — R928 Other abnormal and inconclusive findings on diagnostic imaging of breast: Secondary | ICD-10-CM

## 2013-04-17 MED ORDER — PRAZOSIN HCL 2 MG PO CAPS: 2.0000 mg | ORAL_CAPSULE | Freq: Every day | ORAL | Status: DC

## 2013-04-17 MED ORDER — AMPHETAMINE-DEXTROAMPHETAMINE 20 MG PO TABS: 20.0000 mg | ORAL_TABLET | Freq: Every day | ORAL | Status: DC
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
## 2013-04-18 ENCOUNTER — Telehealth (HOSPITAL_COMMUNITY): Payer: Self-pay | Admitting: *Deleted

## 2013-05-02 ENCOUNTER — Ambulatory Visit
Admission: RE | Admit: 2013-05-02 | Discharge: 2013-05-02 | Disposition: A | Discharge status: Home / Self Care | Payer: PRIVATE HEALTH INSURANCE | Source: Ambulatory Visit | Attending: Pulmonary Disease | Admitting: Pulmonary Disease

## 2013-05-02 DIAGNOSIS — R928 Other abnormal and inconclusive findings on diagnostic imaging of breast: Secondary | ICD-10-CM

## 2013-05-09 ENCOUNTER — Other Ambulatory Visit (INDEPENDENT_AMBULATORY_CARE_PROVIDER_SITE_OTHER): Payer: Self-pay | Admitting: Internal Medicine

## 2013-05-15 ENCOUNTER — Other Ambulatory Visit: Payer: Self-pay

## 2013-06-04 ENCOUNTER — Ambulatory Visit (HOSPITAL_COMMUNITY)
Admission: RE | Admit: 2013-06-04 | Discharge: 2013-06-04 | Disposition: A | Discharge status: Home / Self Care | Payer: PRIVATE HEALTH INSURANCE | Source: Ambulatory Visit | Attending: Pulmonary Disease | Admitting: Pulmonary Disease

## 2013-06-04 ENCOUNTER — Other Ambulatory Visit (HOSPITAL_COMMUNITY): Payer: Self-pay | Admitting: Pulmonary Disease

## 2013-06-04 DIAGNOSIS — M948X9 Other specified disorders of cartilage, unspecified sites: Secondary | ICD-10-CM | POA: Insufficient documentation

## 2013-06-04 DIAGNOSIS — R059 Cough, unspecified: Secondary | ICD-10-CM | POA: Insufficient documentation

## 2013-06-04 DIAGNOSIS — F17200 Nicotine dependence, unspecified, uncomplicated: Secondary | ICD-10-CM | POA: Insufficient documentation

## 2013-06-04 DIAGNOSIS — R05 Cough: Secondary | ICD-10-CM

## 2013-06-04 DIAGNOSIS — M25551 Pain in right hip: Secondary | ICD-10-CM

## 2013-06-04 DIAGNOSIS — M6520 Calcific tendinitis, unspecified site: Secondary | ICD-10-CM | POA: Insufficient documentation

## 2013-06-04 DIAGNOSIS — M25559 Pain in unspecified hip: Secondary | ICD-10-CM | POA: Insufficient documentation

## 2013-06-16 ENCOUNTER — Ambulatory Visit (HOSPITAL_COMMUNITY): Payer: Self-pay | Admitting: Psychiatry

## 2013-06-26 ENCOUNTER — Encounter (HOSPITAL_COMMUNITY): Payer: Self-pay | Admitting: Psychiatry

## 2013-06-26 ENCOUNTER — Ambulatory Visit (INDEPENDENT_AMBULATORY_CARE_PROVIDER_SITE_OTHER): Payer: PRIVATE HEALTH INSURANCE | Admitting: Psychiatry

## 2013-06-26 VITALS — BP 120/88 | Ht 60.0 in | Wt 192.0 lb

## 2013-06-26 DIAGNOSIS — F329 Major depressive disorder, single episode, unspecified: Secondary | ICD-10-CM

## 2013-06-26 DIAGNOSIS — F4310 Post-traumatic stress disorder, unspecified: Secondary | ICD-10-CM

## 2013-06-26 MED ORDER — DULOXETINE HCL 30 MG PO CPEP: ORAL_CAPSULE | ORAL | Status: DC

## 2013-06-26 MED ORDER — AMPHETAMINE-DEXTROAMPHETAMINE 20 MG PO TABS: 20.0000 mg | ORAL_TABLET | Freq: Every day | ORAL | Status: DC

## 2013-06-26 MED ORDER — PRAZOSIN HCL 2 MG PO CAPS: 2.0000 mg | ORAL_CAPSULE | Freq: Every day | ORAL | Status: DC
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
## 2013-07-01 ENCOUNTER — Ambulatory Visit (INDEPENDENT_AMBULATORY_CARE_PROVIDER_SITE_OTHER): Payer: PRIVATE HEALTH INSURANCE | Admitting: Adult Health

## 2013-07-01 ENCOUNTER — Encounter: Payer: Self-pay | Admitting: Adult Health

## 2013-07-01 VITALS — BP 156/97 | HR 84 | Ht 60.0 in | Wt 191.0 lb

## 2013-07-01 DIAGNOSIS — I10 Essential (primary) hypertension: Secondary | ICD-10-CM

## 2013-07-01 DIAGNOSIS — F17200 Nicotine dependence, unspecified, uncomplicated: Secondary | ICD-10-CM

## 2013-07-01 DIAGNOSIS — R079 Chest pain, unspecified: Secondary | ICD-10-CM

## 2013-07-01 NOTE — Patient Instructions (Signed)
Your physician recommends that you schedule a follow-up appointment in: 1 year You will receive a reminder letter two months in advance reminding you to call and schedule your appointment. If you don't receive this letter, please contact our office.  Your physician recommends that you continue on your current medications as directed. Please refer to the Current Medication list given to you today.   

## 2013-07-01 NOTE — Progress Notes (Deleted)
Name: Lindsay Walsh    DOB: 1970/07/14  Age: 42 y.o.  MR#: [id number]       PCP:  Fredirick Maudlin, MD      Insurance: Payor: [employer] MEDICARE / Plan: Ollen Gross / Product Type: *No Product type* /   CC:    Chief Complaint  Patient presents with  . Hypertension    VS Filed Vitals:   07/01/13 1517  BP: 156/97  Pulse: 84  Height: 5' (1.524 m)  Weight: 191 lb (86.637 kg)    Weights Current Weight  07/01/13 191 lb (86.637 kg)  06/26/13 192 lb (87.091 kg)  04/17/13 195 lb (88.451 kg)    Blood Pressure  BP Readings from Last 3 Encounters:  07/01/13 156/97  06/26/13 120/88  04/17/13 170/90     Admit date:  (Not on file) Last encounter with RMR:  Visit date not found   Allergy Doxycycline; Fentanyl; Iohexol; Ivp dye; Wellbutrin; and Tape  Current Outpatient Prescriptions  Medication Sig Dispense Refill  . amphetamine-dextroamphetamine (ADDERALL) 20 MG tablet Take 1 tablet (20 mg total) by mouth daily.  30 tablet  0  . Ascorbic Acid (VITAMIN C) 1000 MG tablet Take 1,000 mg by mouth every morning.       Marland Kitchen azaTHIOprine (IMURAN) 50 MG tablet take 1 tablet by mouth once daily  30 tablet  5  . calcium-vitamin D (OS-CAL 500 + D) 500-200 MG-UNIT per tablet Take 1 tablet by mouth 2 (two) times daily.       . cyanocobalamin 2000 MCG tablet Take 1,500 mcg by mouth daily. Patient is taking 1500 mcg daily.      Marland Kitchen dicyclomine (BENTYL) 10 MG capsule Take 1 capsule (10 mg total) by mouth 3 (three) times daily before meals. For cramps  90 capsule  5  . DULoxetine (CYMBALTA) 30 MG capsule Take one three times a day  90 capsule  2  . esomeprazole (NEXIUM) 40 MG capsule Take 40 mg by mouth every morning.      Marland Kitchen estradiol (CLIMARA - DOSED IN MG/24 HR) 0.075 mg/24hr Place 1 patch onto the skin once a week.        Marland Kitchen ESZOPICLONE 3 MG tablet Take 3 mg by mouth at bedtime as needed. For sleep      . furosemide (LASIX) 40 MG tablet Take 40 mg by mouth daily.       Marland Kitchen gabapentin (NEURONTIN) 300  MG capsule Take 300 mg by mouth at bedtime as needed.       Marland Kitchen GARLIC PO Take 4,098 mg by mouth daily.       . isosorbide mononitrate (IMDUR) 30 MG 24 hr tablet Take 1 tablet (30 mg total) by mouth daily.  30 each  11  . levETIRAcetam (KEPPRA) 750 MG tablet Take 750 mg by mouth at bedtime.       . lidocaine (LIDODERM) 5 % Place 1 patch onto the skin daily. Remove & Discard patch within 12 hours or as directed by MD  30 patch  1  . lisinopril (PRINIVIL,ZESTRIL) 40 MG tablet Take 40 mg by mouth daily.       . methadone (DOLOPHINE) 5 MG tablet Take 5 mg by mouth 4 (four) times daily as needed. For pain      . metoprolol succinate (TOPROL-XL) 25 MG 24 hr tablet Take 3 tablets (75 mg total) by mouth every morning.  90 tablet  6  . Multiple Vitamins-Minerals (CENTRUM) tablet Take 1 tablet by mouth daily.      Marland Kitchen  pramipexole (MIRAPEX) 0.25 MG tablet Take 0.25 mg by mouth at bedtime as needed. For restless legs      . prazosin (MINIPRESS) 2 MG capsule Take 1 capsule (2 mg total) by mouth at bedtime.  30 capsule  2  . PROAIR HFA 108 (90 BASE) MCG/ACT inhaler Inhale 2 puffs into the lungs as needed.       . Probiotic Product (ALIGN PO) Take 1 capsule by mouth daily.        No current facility-administered medications for this visit.    Discontinued Meds:    Medications Discontinued During This Encounter  Medication Reason  . amphetamine-dextroamphetamine (ADDERALL) 20 MG tablet Error  . Biotin 2500 MCG CAPS Error  . amphetamine-dextroamphetamine (ADDERALL) 20 MG tablet Error    Patient Active Problem List   Diagnosis Date Noted  . Nightmares associated with chronic post-traumatic stress disorder 10/18/2012  . Sprain of foot 03/14/2012  . Weakness 01/29/2012  . Hepatitis, autoimmune 01/29/2012  . IBS (irritable bowel syndrome) 08/01/2011  . Gastroesophageal reflux disease   . Hypertension   . Depression   . Chest pain   . Fasting hyperglycemia   . TOBACCO ABUSE 04/28/2010  . NARCOTIC ABUSE  04/28/2010  . PULMONARY NODULE 04/28/2010  . AUTOIMMUNE HEPATITIS 04/28/2010  . FIBROMYALGIA 01/06/2009    LABS    Component Value Date/Time   NA 138 03/04/2013 0604   NA 145 10/21/2012 0910   NA 138 07/18/2012 1915   K 3.6 03/04/2013 0604   K 4.5 10/21/2012 0910   K 3.5 07/18/2012 1915   CL 100 03/04/2013 0604   CL 109 10/21/2012 0910   CL 103 07/18/2012 1915   CO2 28 03/04/2013 0604   CO2 28 10/21/2012 0910   CO2 29 07/18/2012 1915   GLUCOSE 128* 03/04/2013 0604   GLUCOSE 114* 10/21/2012 0910   GLUCOSE 110* 07/18/2012 1915   BUN 7 03/04/2013 0604   BUN 10 10/21/2012 0910   BUN 7 07/18/2012 1915   CREATININE 0.60 03/04/2013 0604   CREATININE 0.60 10/21/2012 0910   CREATININE 0.59 07/18/2012 1915   CREATININE 0.60 03/14/2012 1400   CREATININE 0.56 01/29/2012 1240   CREATININE 0.59 09/11/2011 1635   CALCIUM 9.6 03/04/2013 0604   CALCIUM 8.8 10/21/2012 0910   CALCIUM 9.0 07/18/2012 1915   GFRNONAA >90 03/04/2013 0604   GFRNONAA >90 07/18/2012 1915   GFRNONAA >90 09/11/2011 1635   GFRAA >90 03/04/2013 0604   GFRAA >90 07/18/2012 1915   GFRAA >90 09/11/2011 1635   CMP     Component Value Date/Time   NA 138 03/04/2013 0604   K 3.6 03/04/2013 0604   CL 100 03/04/2013 0604   CO2 28 03/04/2013 0604   GLUCOSE 128* 03/04/2013 0604   BUN 7 03/04/2013 0604   CREATININE 0.60 03/04/2013 0604   CREATININE 0.60 10/21/2012 0910   CALCIUM 9.6 03/04/2013 0604   PROT 6.4 10/21/2012 0910   ALBUMIN 3.6 10/21/2012 0910   AST 18 10/21/2012 0910   ALT 13 10/21/2012 0910   ALKPHOS 80 10/21/2012 0910   BILITOT 0.4 10/21/2012 0910   GFRNONAA >90 03/04/2013 0604   GFRAA >90 03/04/2013 0604       Component Value Date/Time   WBC 10.2 03/04/2013 0604   WBC 9.8 07/18/2012 1915   WBC 10.4 01/29/2012 1240   HGB 12.9 03/04/2013 0604   HGB 13.2 07/18/2012 1915   HGB 13.6 01/29/2012 1240   HCT 36.5 03/04/2013 0604   HCT 37.3 07/18/2012 1915  HCT 38.6 01/29/2012 1240   MCV 89.9 03/04/2013 0604   MCV 91.0 07/18/2012 1915   MCV 90.8 01/29/2012 1240     Lipid Panel     Component Value Date/Time   CHOL 178 03/04/2008   HDL 40 03/04/2008   LDLCALC 108t 03/04/2008    ABG    Component Value Date/Time   PHART 7.389 01/01/2013 1000   PCO2ART 39.0 01/01/2013 1000   PO2ART 89.7 01/01/2013 1000   HCO3 23.1 01/01/2013 1000   TCO2 20.7 01/01/2013 1000   ACIDBASEDEF 1.2 01/01/2013 1000   O2SAT 97.0 01/01/2013 1000     Lab Results  Component Value Date   TSH 3.381 01/29/2012   BNP (last 3 results) No results found for this basename: PROBNP,  in the last 8760 hours Cardiac Panel (last 3 results) No results found for this basename: CKTOTAL, CKMB, TROPONINI, RELINDX,  in the last 72 hours  Iron/TIBC/Ferritin No results found for this basename: iron, tibc, ferritin     EKG Orders placed in visit on 03/04/13  . EKG 12-LEAD     Prior Assessment and Plan Problem List as of 07/01/2013   TOBACCO ABUSE   Last Assessment & Plan   12/25/2012 Office Visit Written 12/25/2012 12:10 PM by Wendall Stade, MD     Would not use chantix given other psychoacitve meds.  Discussed nicorette gum and patches.      NARCOTIC ABUSE   PULMONARY NODULE   AUTOIMMUNE HEPATITIS   FIBROMYALGIA   Gastroesophageal reflux disease   Hypertension   Last Assessment & Plan   12/25/2012 Office Visit Written 12/25/2012 12:12 PM by Wendall Stade, MD     Stable would try to get off adderrall and if she can stop smoking this would help Continue ACE and beta blocker    Depression   Chest pain   Last Assessment & Plan   12/25/2012 Office Visit Written 12/25/2012 12:11 PM by Wendall Stade, MD     Atypical not likely cardiac Fairly resolved Normal ECG  Discussed stopping imdur    Fasting hyperglycemia   IBS (irritable bowel syndrome)   Weakness   Hepatitis, autoimmune   Sprain of foot   Nightmares associated with chronic post-traumatic stress disorder       Imaging: Dg Chest 2 View  06/04/2013   CLINICAL DATA:  Cough.  One and 1/2 pack a day smoker.  EXAM: CHEST  2  VIEW  COMPARISON:  03/04/2013.  06/23/2011.  FINDINGS: Cardiopericardial silhouette within normal limits. Mediastinal contours normal. Trachea midline. No airspace disease or effusion. No interval change.  IMPRESSION: No active cardiopulmonary disease.   Electronically Signed   By: Andreas Newport M.D.   On: 06/04/2013 12:17   Dg Hip Complete Right  06/04/2013   CLINICAL DATA:  Bilateral hip pain.  No injury.  EXAM: RIGHT HIP - COMPLETE 2+ VIEW  COMPARISON:  None.  FINDINGS: Calcific tendinitis of the left gluteal tendons is present. There is no calcific tendinitis of the right hip. "Whiskering" of the iliac crests is present compatible with diffuse idiopathic skeletal hyperostosis. Right hip joint space is preserved. No osteophytes or joint space narrowing. SI joints appear within normal limits. Pelvic rings intact.  IMPRESSION: 1. No acute abnormality. 2. Left gluteal calcific tendinitis. 3. DISH.   Electronically Signed   By: Andreas Newport M.D.   On: 06/04/2013 12:25

## 2013-07-01 NOTE — Assessment & Plan Note (Signed)
Controlled currently. Continue current medications as directed. Labs per primary.

## 2013-07-01 NOTE — Progress Notes (Signed)
HPI: Lindsay Walsh is a 42 year old patient of Dr. [name] we are following for ongoing assessment and management of hypertension with a history of noncardiac chest pain and palpitation. Patient sobers from not fibromyalgia anxiety and has ongoing tobacco abuse. She was recently seen in the ER in August of 2014 with complaints of chest discomfort which again was found to be noncardiac and related to fibromyalgia and GERD. She was not admitted and provided no treatment. She is asked to drink plenty of fluids and rest.   She comes today with multiple somatic complaints. Wants to stop smoking. States that she passed out 2 months ago when she was sitting on the floor playing with her cat. She continues to have DOE. She admits to drinking a lot of caffeine.   Allergies  Allergen Reactions  . Doxycycline Shortness Of Breath and Rash  . Fentanyl Shortness Of Breath, Itching and Other (See Comments)    Heart racing, SOB  . Iohexol Other (See Comments)    Knots on body   . Ivp Dye [Iodinated Diagnostic Agents] Other (See Comments)    Knots on body  . Wellbutrin [Bupropion Hcl] Other (See Comments)    unknown  . Tape Rash    Current Outpatient Prescriptions  Medication Sig Dispense Refill  . amphetamine-dextroamphetamine (ADDERALL) 20 MG tablet Take 1 tablet (20 mg total) by mouth daily.  30 tablet  0  . Ascorbic Acid (VITAMIN C) 1000 MG tablet Take 1,000 mg by mouth every morning.       Marland Kitchen azaTHIOprine (IMURAN) 50 MG tablet take 1 tablet by mouth once daily  30 tablet  5  . calcium-vitamin D (OS-CAL 500 + D) 500-200 MG-UNIT per tablet Take 1 tablet by mouth 2 (two) times daily.       . cyanocobalamin 2000 MCG tablet Take 1,500 mcg by mouth daily. Patient is taking 1500 mcg daily.      Marland Kitchen dicyclomine (BENTYL) 10 MG capsule Take 1 capsule (10 mg total) by mouth 3 (three) times daily before meals. For cramps  90 capsule  5  . DULoxetine (CYMBALTA) 30 MG capsule Take one three times a day  90 capsule   2  . esomeprazole (NEXIUM) 40 MG capsule Take 40 mg by mouth every morning.      Marland Kitchen estradiol (CLIMARA - DOSED IN MG/24 HR) 0.075 mg/24hr Place 1 patch onto the skin once a week.        Marland Kitchen ESZOPICLONE 3 MG tablet Take 3 mg by mouth at bedtime as needed. For sleep      . furosemide (LASIX) 40 MG tablet Take 40 mg by mouth daily.       Marland Kitchen gabapentin (NEURONTIN) 300 MG capsule Take 300 mg by mouth at bedtime as needed.       Marland Kitchen GARLIC PO Take 5,409 mg by mouth daily.       . isosorbide mononitrate (IMDUR) 30 MG 24 hr tablet Take 1 tablet (30 mg total) by mouth daily.  30 each  11  . levETIRAcetam (KEPPRA) 750 MG tablet Take 750 mg by mouth at bedtime.       . lidocaine (LIDODERM) 5 % Place 1 patch onto the skin daily. Remove & Discard patch within 12 hours or as directed by MD  30 patch  1  . lisinopril (PRINIVIL,ZESTRIL) 40 MG tablet Take 40 mg by mouth daily.       . methadone (DOLOPHINE) 5 MG tablet Take 5 mg by mouth 4 (  four) times daily as needed. For pain      . metoprolol succinate (TOPROL-XL) 25 MG 24 hr tablet Take 3 tablets (75 mg total) by mouth every morning.  90 tablet  6  . Multiple Vitamins-Minerals (CENTRUM) tablet Take 1 tablet by mouth daily.      . pramipexole (MIRAPEX) 0.25 MG tablet Take 0.25 mg by mouth at bedtime as needed. For restless legs      . prazosin (MINIPRESS) 2 MG capsule Take 1 capsule (2 mg total) by mouth at bedtime.  30 capsule  2  . PROAIR HFA 108 (90 BASE) MCG/ACT inhaler Inhale 2 puffs into the lungs as needed.       . Probiotic Product (ALIGN PO) Take 1 capsule by mouth daily.        No current facility-administered medications for this visit.    Past Medical History  Diagnosis Date  . Gastroesophageal reflux disease     Chronic abdominal pain; gastroparesis; globus hystericus; irritable bowel syndrome  . Hypertension   . Depression   . Fibromyalgia   . Chest pain     Normal stress echo in 2011; PVCs; pedal edema  . Tobacco abuse     one pack per  day; 35 pack years  . Overweight(278.02)   . Narcotic dependence   . Pulmonary nodule   . Fasting hyperglycemia   . Shortness of breath     with exertion  . Sleep apnea   . Autoimmune hepatitis   . Irritable bowel syndrome   . Arthritis     back  . Angina     took SL nitro one week ago  . Dysrhythmia     palpatations  . PTSD (post-traumatic stress disorder)     Past Surgical History  Procedure Laterality Date  . Upper gastrointestinal endoscopy  09/13/2010  . Liver biopsy      x4  . Total abdominal hysterectomy w/ bilateral salpingoophorectomy    . Cesarean section      X3  . Tubal ligation      Bilateral  . Abdominal hysterectomy    . Cholecystectomy    . Bladder suspension  09/12/2011    Procedure: TRANSVAGINAL TAPE (TVT) PROCEDURE;  Surgeon: Ky Barban, MD;  Location: [facility];  Service: Urology;  Laterality: N/A;  . Lumbar epidural injection  01/2012    ROS: Review of systems complete and found to be negative unless listed above  PHYSICAL EXAM BP 156/97  Pulse 84  Ht 5' (1.524 m)  Wt 191 lb (86.637 kg)  BMI 37.30 kg/m2  General: Well developed, well nourished, in no acute distress Head: Eyes PERRLA, No xanthomas.   Normal cephalic and atramatic  Lungs: Clear bilaterally to auscultation and percussion. Heart: HRRR S1 S2,  tachycardic without MRG.  Pulses are 2+ & equal.            No carotid bruit. No JVD.  No abdominal bruits. No femoral bruits. Abdomen: Bowel sounds are positive, abdomen soft and non-tender without masses or                  Hernia's noted. Msk:  Back normal, normal gait. Normal strength and tone for age. Pain is reproducible with palpation. Extremities: No clubbing, cyanosis or edema.  DP +1 Neuro: Alert and oriented X 3. Psych:  Good affect, responds appropriately    ASSESSMENT AND PLAN

## 2013-07-01 NOTE — Assessment & Plan Note (Signed)
Nicoderm patches are now available OTC. She is advised to try these. I will not place her on chantix due to her multiple psychotropic medications. Encouragement to quit.

## 2013-07-01 NOTE — Assessment & Plan Note (Signed)
Found to be negative for cardiac etiology for chest pain. She is not planned for any further cardiac testing at this time.

## 2013-07-16 ENCOUNTER — Encounter: Payer: Self-pay | Admitting: Orthopedic Surgery

## 2013-07-16 ENCOUNTER — Ambulatory Visit (INDEPENDENT_AMBULATORY_CARE_PROVIDER_SITE_OTHER): Payer: PRIVATE HEALTH INSURANCE

## 2013-07-16 ENCOUNTER — Ambulatory Visit (INDEPENDENT_AMBULATORY_CARE_PROVIDER_SITE_OTHER): Payer: PRIVATE HEALTH INSURANCE | Admitting: Orthopedic Surgery

## 2013-07-16 VITALS — BP 132/93 | Ht 60.0 in | Wt 193.0 lb

## 2013-07-16 DIAGNOSIS — IMO0001 Reserved for inherently not codable concepts without codable children: Secondary | ICD-10-CM

## 2013-07-16 DIAGNOSIS — F1910 Other psychoactive substance abuse, uncomplicated: Secondary | ICD-10-CM

## 2013-07-16 DIAGNOSIS — M76899 Other specified enthesopathies of unspecified lower limb, excluding foot: Secondary | ICD-10-CM

## 2013-07-16 DIAGNOSIS — M25552 Pain in left hip: Principal | ICD-10-CM

## 2013-07-16 DIAGNOSIS — M25551 Pain in right hip: Secondary | ICD-10-CM | POA: Insufficient documentation

## 2013-07-16 DIAGNOSIS — F515 Nightmare disorder: Secondary | ICD-10-CM

## 2013-07-16 DIAGNOSIS — M5137 Other intervertebral disc degeneration, lumbosacral region: Secondary | ICD-10-CM

## 2013-07-16 DIAGNOSIS — M7062 Trochanteric bursitis, left hip: Secondary | ICD-10-CM

## 2013-07-16 DIAGNOSIS — M51369 Other intervertebral disc degeneration, lumbar region without mention of lumbar back pain or lower extremity pain: Secondary | ICD-10-CM | POA: Insufficient documentation

## 2013-07-16 DIAGNOSIS — F32A Depression, unspecified: Secondary | ICD-10-CM

## 2013-07-16 DIAGNOSIS — M7061 Trochanteric bursitis, right hip: Secondary | ICD-10-CM | POA: Insufficient documentation

## 2013-07-16 DIAGNOSIS — M25559 Pain in unspecified hip: Secondary | ICD-10-CM

## 2013-07-16 DIAGNOSIS — F17200 Nicotine dependence, unspecified, uncomplicated: Secondary | ICD-10-CM

## 2013-07-16 DIAGNOSIS — F3289 Other specified depressive episodes: Secondary | ICD-10-CM

## 2013-07-16 DIAGNOSIS — F329 Major depressive disorder, single episode, unspecified: Secondary | ICD-10-CM

## 2013-07-16 DIAGNOSIS — F4310 Post-traumatic stress disorder, unspecified: Secondary | ICD-10-CM

## 2013-07-16 DIAGNOSIS — IMO0002 Reserved for concepts with insufficient information to code with codable children: Secondary | ICD-10-CM

## 2013-07-16 DIAGNOSIS — G894 Chronic pain syndrome: Secondary | ICD-10-CM

## 2013-07-16 DIAGNOSIS — M5136 Other intervertebral disc degeneration, lumbar region: Secondary | ICD-10-CM | POA: Insufficient documentation

## 2013-07-16 NOTE — Progress Notes (Signed)
Patient ID: Lindsay Walsh, female   DOB: 1970/11/30, 43 y.o.   MRN: [id number] Chief Complaint  Patient presents with  . Hip Pain    Bilateral hip pain. Consult from DR. Luan Pulling    History: This is a 43 year old female who has a history of chronic pain and chronic back pain and back problems who presents with bilateral hip pain right greater than left times a year and a half. The pain is over the greater trochanter and is worse when she's laying on her right or left side it is also noted when walking. The pain however is constant it is rated 9/10 it is described as sharp throbbing stabbing and burning  Review of systems weight gain and fatigue, palpitations. Shortness of breath. Wheezing and snoring. Heartburn and diarrhea. Joint pain and swelling, stiffness and muscle pain. Numbness and tingling, dizziness, anxiety and depression, easy bruising: Excessive thirst and heat intolerance cold intolerance seasonal allergies. The other systems were reviewed and were normal.  The past, family history and social history have been reviewed and are recorded in the corresponding sections of epic   General appearance is normal, the patient is alert and oriented x3 with normal mood and affect. BP 132/93  Ht 5' (1.524 m)  Wt 193 lb (87.544 kg)  BMI 37.69 kg/m2 Body habitus endomorphic  She does have some back tenderness some back pain some SI joint discomfort but she also has tenderness and swelling over the greater trochanter both hips. Hip range of motion is otherwise normal both hips are stable she has normal muscle tone in both thighs and her skin is intact over both hips and thighs. She has normal pulse in both lower extremities  Her x-rays show mild degenerative disc disease normal hip joints  Impression hip bursitis with the following comorbidities Encounter Diagnoses  Name Primary?  . Bilateral hip pain Yes  . Depression   . FIBROMYALGIA   . NARCOTIC ABUSE   . TOBACCO ABUSE   . Nightmares  associated with chronic post-traumatic stress disorder   . Degenerative disc disease, lumbar   . Chronic pain syndrome     We injected both hips for bursitis  She can be one every 4 months. Recommend ice for control of discomfort  Followup as needed  Send copy to Dr. Luan Pulling  Hip Injection Verbal consent was obtained  Timeout procedure was completed for each hip  Alcohol was used as a prep with ethyl chloride as an anesthetizing agent  Using a 22-gauge spinal needle the greater trochanter bursa was entered and injected with Depo-Medrol 40 mg.  We also diluted out with 3 cc 1% plain lidocaine

## 2013-07-22 ENCOUNTER — Telehealth (INDEPENDENT_AMBULATORY_CARE_PROVIDER_SITE_OTHER): Payer: Self-pay | Admitting: *Deleted

## 2013-07-22 ENCOUNTER — Telehealth: Payer: Self-pay | Admitting: Orthopedic Surgery

## 2013-07-22 NOTE — Telephone Encounter (Signed)
Lindsay Walsh received injections in both hips 07/16/13, the next day she noticed a rash on each hip around the injection site. The right is worse. She has been using alcohol And triple antibiotic cream but it is getting worse.  Says she will be in the area this afternoon if you want to see her. Please advise

## 2013-07-22 NOTE — Telephone Encounter (Signed)
Patient advised of doctor's reply

## 2013-07-22 NOTE — Telephone Encounter (Signed)
Needs samples of Align. She can not afford the refill until she gets paid. The return phone number is [phone].

## 2013-07-22 NOTE — Telephone Encounter (Signed)
No need to see the patient at this time the rash will resolve within 14 days if still present after 14 day schedule appointment for evaluation

## 2013-07-23 ENCOUNTER — Ambulatory Visit (HOSPITAL_COMMUNITY): Payer: Self-pay | Admitting: Psychiatry

## 2013-07-23 NOTE — Telephone Encounter (Signed)
Spoke with patient. We do not have samples

## 2013-08-07 ENCOUNTER — Ambulatory Visit (HOSPITAL_COMMUNITY): Payer: Self-pay | Admitting: Psychiatry

## 2013-08-18 ENCOUNTER — Telehealth (HOSPITAL_COMMUNITY): Payer: Self-pay | Admitting: *Deleted

## 2013-09-17 ENCOUNTER — Telehealth (HOSPITAL_COMMUNITY): Payer: Self-pay | Admitting: *Deleted

## 2013-09-22 ENCOUNTER — Encounter (HOSPITAL_COMMUNITY): Payer: Self-pay | Admitting: Psychiatry

## 2013-09-22 ENCOUNTER — Ambulatory Visit (INDEPENDENT_AMBULATORY_CARE_PROVIDER_SITE_OTHER): Payer: PRIVATE HEALTH INSURANCE | Admitting: Psychiatry

## 2013-09-22 VITALS — BP 160/90 | Ht 60.0 in | Wt 192.0 lb

## 2013-09-22 DIAGNOSIS — F4312 Post-traumatic stress disorder, chronic: Secondary | ICD-10-CM

## 2013-09-22 DIAGNOSIS — F329 Major depressive disorder, single episode, unspecified: Secondary | ICD-10-CM

## 2013-09-22 DIAGNOSIS — F4310 Post-traumatic stress disorder, unspecified: Secondary | ICD-10-CM

## 2013-09-22 DIAGNOSIS — F515 Nightmare disorder: Secondary | ICD-10-CM

## 2013-09-22 MED ORDER — AMPHETAMINE-DEXTROAMPHETAMINE 20 MG PO TABS: 20.0000 mg | ORAL_TABLET | Freq: Every day | ORAL | Status: DC

## 2013-09-22 MED ORDER — DULOXETINE HCL 30 MG PO CPEP: ORAL_CAPSULE | ORAL | Status: DC

## 2013-09-22 MED ORDER — PRAZOSIN HCL 2 MG PO CAPS: 2.0000 mg | ORAL_CAPSULE | Freq: Every day | ORAL | Status: DC
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
## 2013-09-23 ENCOUNTER — Ambulatory Visit: Payer: Self-pay | Admitting: Urology

## 2013-09-29 ENCOUNTER — Telehealth (HOSPITAL_COMMUNITY): Payer: Self-pay | Admitting: *Deleted

## 2013-09-29 ENCOUNTER — Ambulatory Visit (INDEPENDENT_AMBULATORY_CARE_PROVIDER_SITE_OTHER): Payer: PRIVATE HEALTH INSURANCE | Admitting: Internal Medicine

## 2013-09-29 ENCOUNTER — Encounter (INDEPENDENT_AMBULATORY_CARE_PROVIDER_SITE_OTHER): Payer: Self-pay | Admitting: Internal Medicine

## 2013-09-29 VITALS — BP 108/74 | HR 76 | Temp 97.3°F | Resp 16 | Ht 60.0 in | Wt 194.0 lb

## 2013-09-29 DIAGNOSIS — E669 Obesity, unspecified: Secondary | ICD-10-CM

## 2013-09-29 DIAGNOSIS — IMO0002 Reserved for concepts with insufficient information to code with codable children: Secondary | ICD-10-CM

## 2013-09-29 DIAGNOSIS — M792 Neuralgia and neuritis, unspecified: Secondary | ICD-10-CM

## 2013-09-29 DIAGNOSIS — K589 Irritable bowel syndrome without diarrhea: Secondary | ICD-10-CM

## 2013-09-29 DIAGNOSIS — K219 Gastro-esophageal reflux disease without esophagitis: Secondary | ICD-10-CM

## 2013-09-29 DIAGNOSIS — K754 Autoimmune hepatitis: Secondary | ICD-10-CM

## 2013-09-29 LAB — CBC WITH DIFFERENTIAL/PLATELET
BASOS PCT: 0 % (ref 0–1)
Basophils Absolute: 0 10*3/uL (ref 0.0–0.1)
Eosinophils Absolute: 0.2 10*3/uL (ref 0.0–0.7)
Eosinophils Relative: 2 % (ref 0–5)
HEMATOCRIT: 35.2 % — AB (ref 36.0–46.0)
HEMOGLOBIN: 12.1 g/dL (ref 12.0–15.0)
LYMPHS ABS: 2.2 10*3/uL (ref 0.7–4.0)
LYMPHS PCT: 21 % (ref 12–46)
MCH: 31 pg (ref 26.0–34.0)
MCHC: 34.4 g/dL (ref 30.0–36.0)
MCV: 90.3 fL (ref 78.0–100.0)
MONO ABS: 1.1 10*3/uL — AB (ref 0.1–1.0)
MONOS PCT: 10 % (ref 3–12)
NEUTROS ABS: 7.2 10*3/uL (ref 1.7–7.7)
NEUTROS PCT: 67 % (ref 43–77)
Platelets: 269 10*3/uL (ref 150–400)
RBC: 3.9 MIL/uL (ref 3.87–5.11)
RDW: 13.6 % (ref 11.5–15.5)
WBC: 10.7 10*3/uL — AB (ref 4.0–10.5)

## 2013-09-29 MED ORDER — LIDOCAINE 5 % EX PTCH: 1.0000 | MEDICATED_PATCH | CUTANEOUS | Status: DC

## 2013-09-29 NOTE — Patient Instructions (Signed)
Physician will contact you with results of blood tests.

## 2013-09-29 NOTE — Progress Notes (Signed)
Presenting complaint;  Followup for autoimmune hepatitis, GERD and IBS.  Subjective:  Patient is a 43 year old Caucasian female was here for scheduled visit. She was last seen 6 months ago. She was last seen at [facility] by Dr. Lockie Mola but he has left [facility] since. She has not had LFTs recently. Her blood glucose levels been running high and she's been informed by Dr. Luan Pulling about making dietary changes. She states she has craving for sweets which he eats at night. She believes her glucose level drops but she's never had it checked. She says Nexium is working and she does not experience heartburn often. She denies dysphagia. She remains with irregular bowel movements. She either has diarrhea constipation and normal stools less often. She has diarrhea more than constipation. She denies melena or rectal bleeding. She states she develops bloating if she does not take probiotic. She is having back pain and is scheduled to see Dr. Lovena Le who is a Restaurant manager, fast food. She would like to get Lidoderm prescription. She uses this medication for right rib cage pain and at times for low back pain.            Current Medications: Outpatient Encounter Prescriptions as of 09/29/2013  Medication Sig  . amphetamine-dextroamphetamine (ADDERALL) 20 MG tablet Take 1 tablet (20 mg total) by mouth daily.  . Ascorbic Acid (VITAMIN C) 1000 MG tablet Take 1,000 mg by mouth every morning.   Marland Kitchen azaTHIOprine (IMURAN) 50 MG tablet take 1 tablet by mouth once daily  . calcium-vitamin D (OS-CAL 500 + D) 500-200 MG-UNIT per tablet Take 1 tablet by mouth 2 (two) times daily.   . cyanocobalamin 2000 MCG tablet Take 1,500 mcg by mouth daily. Patient is taking 1500 mcg daily.  Marland Kitchen dicyclomine (BENTYL) 10 MG capsule Take 1 capsule (10 mg total) by mouth 3 (three) times daily before meals. For cramps  . DULoxetine (CYMBALTA) 30 MG capsule Take one three times a day  . esomeprazole (NEXIUM) 40 MG capsule Take 40 mg by mouth every  morning.  Marland Kitchen estradiol (CLIMARA - DOSED IN MG/24 HR) 0.075 mg/24hr Place 1 patch onto the skin once a week.    Marland Kitchen ESZOPICLONE 3 MG tablet Take 3 mg by mouth at bedtime as needed. For sleep  . furosemide (LASIX) 40 MG tablet Take 40 mg by mouth daily.   Marland Kitchen gabapentin (NEURONTIN) 300 MG capsule Take 300 mg by mouth at bedtime as needed.   Marland Kitchen GARLIC PO Take 9,735 mg by mouth daily.   . isosorbide mononitrate (IMDUR) 30 MG 24 hr tablet Take 1 tablet (30 mg total) by mouth daily.  Marland Kitchen levETIRAcetam (KEPPRA) 750 MG tablet Take 750 mg by mouth at bedtime.   . lidocaine (LIDODERM) 5 % Place 1 patch onto the skin daily. Remove & Discard patch within 12 hours or as directed by MD  . lisinopril (PRINIVIL,ZESTRIL) 40 MG tablet Take 40 mg by mouth daily.   . methadone (DOLOPHINE) 5 MG tablet Take 5 mg by mouth 4 (four) times daily as needed. For pain  . metoprolol succinate (TOPROL-XL) 25 MG 24 hr tablet Take 3 tablets (75 mg total) by mouth every morning.  . Multiple Vitamins-Minerals (CENTRUM) tablet Take 1 tablet by mouth daily.  . pramipexole (MIRAPEX) 0.25 MG tablet Take 0.25 mg by mouth at bedtime as needed. For restless legs  . prazosin (MINIPRESS) 2 MG capsule Take 1 capsule (2 mg total) by mouth at bedtime.  Marland Kitchen PROAIR HFA 108 (90 BASE) MCG/ACT inhaler Inhale  2 puffs into the lungs as needed.   . Probiotic Product (ALIGN PO) Take 1 capsule by mouth daily.   . nicotine (NICODERM CQ - DOSED IN MG/24 HOURS) 21 mg/24hr patch Place 21 mg onto the skin daily.  . [DISCONTINUED] amphetamine-dextroamphetamine (ADDERALL) 20 MG tablet Take 1 tablet (20 mg total) by mouth daily with breakfast.  . [DISCONTINUED] amphetamine-dextroamphetamine (ADDERALL) 20 MG tablet Take 1 tablet (20 mg total) by mouth daily.     Objective: Blood pressure 108/74, pulse 76, temperature 97.3 F (36.3 C), temperature source Oral, resp. rate 16, height 5' (1.524 m), weight 194 lb (87.998 kg). Patient is alert and in no acute  distress. Conjunctiva is pink. Sclera is nonicteric Oropharyngeal mucosa is normal. No neck masses or thyromegaly noted. Cardiac exam with regular rhythm normal S1 and S2. No murmur or gallop noted. Lungs are clear to auscultation. Abdomen is full but soft and nontender without organomegaly or masses. No LE edema or clubbing noted.    Assessment:  #1. Autoimmune hepatitis. It was diagnosed 11 years ago. She has remained in biochemical and histologic remission. She had second liver biopsy at the time of cholecystectomy about 3 or 4 years ago at [facility] and she was felt to be in remission. #2. GERD. She is doing well with therapy. #3. Irritable bowel syndrome. Symptoms not well controlled with therapy. Medications may be contribution. #4. Right chest wall pain. This pain is chronic and she is requiring Lidoderm less often.   Plan:  New prescription issued for Lidoderm patches. Patient will go to lab for CBC with differential and LFTs. Office visit in 6 months.

## 2013-09-30 DIAGNOSIS — E669 Obesity, unspecified: Secondary | ICD-10-CM | POA: Insufficient documentation

## 2013-09-30 LAB — HEPATIC FUNCTION PANEL
ALT: 13 U/L (ref 0–35)
AST: 17 U/L (ref 0–37)
Albumin: 3.9 g/dL (ref 3.5–5.2)
Alkaline Phosphatase: 85 U/L (ref 39–117)
BILIRUBIN DIRECT: 0.1 mg/dL (ref 0.0–0.3)
BILIRUBIN INDIRECT: 0.2 mg/dL (ref 0.2–1.2)
BILIRUBIN TOTAL: 0.3 mg/dL (ref 0.2–1.2)
Total Protein: 6.6 g/dL (ref 6.0–8.3)

## 2013-10-06 ENCOUNTER — Ambulatory Visit (HOSPITAL_COMMUNITY): Payer: Self-pay | Admitting: Psychiatry

## 2013-10-07 ENCOUNTER — Encounter (HOSPITAL_COMMUNITY): Payer: Self-pay | Admitting: Psychiatry

## 2013-10-08 ENCOUNTER — Telehealth: Payer: Self-pay | Admitting: *Deleted

## 2013-10-08 MED ORDER — METOPROLOL SUCCINATE ER 25 MG PO TB24: 75.0000 mg | ORAL_TABLET | Freq: Every morning | ORAL | Status: DC

## 2013-10-08 NOTE — Telephone Encounter (Signed)
Medication sent via escribe.  

## 2013-10-08 NOTE — Telephone Encounter (Signed)
Metoprolol succ 25mg  #90

## 2013-10-28 ENCOUNTER — Ambulatory Visit: Payer: Self-pay | Admitting: Urology

## 2013-11-07 ENCOUNTER — Other Ambulatory Visit (INDEPENDENT_AMBULATORY_CARE_PROVIDER_SITE_OTHER): Payer: Self-pay | Admitting: Internal Medicine

## 2013-12-08 ENCOUNTER — Other Ambulatory Visit (INDEPENDENT_AMBULATORY_CARE_PROVIDER_SITE_OTHER): Payer: Self-pay | Admitting: Internal Medicine

## 2013-12-09 NOTE — Telephone Encounter (Signed)
Per Dr.[name] may fill with 5 additional refills. 

## 2013-12-23 ENCOUNTER — Ambulatory Visit (INDEPENDENT_AMBULATORY_CARE_PROVIDER_SITE_OTHER): Payer: PRIVATE HEALTH INSURANCE | Admitting: Psychiatry

## 2013-12-23 ENCOUNTER — Encounter (HOSPITAL_COMMUNITY): Payer: Self-pay | Admitting: Psychiatry

## 2013-12-23 VITALS — BP 140/86 | Ht 60.0 in | Wt 192.0 lb

## 2013-12-23 DIAGNOSIS — F4310 Post-traumatic stress disorder, unspecified: Secondary | ICD-10-CM

## 2013-12-23 DIAGNOSIS — F329 Major depressive disorder, single episode, unspecified: Secondary | ICD-10-CM

## 2013-12-23 DIAGNOSIS — F515 Nightmare disorder: Secondary | ICD-10-CM

## 2013-12-23 MED ORDER — AMPHETAMINE-DEXTROAMPHETAMINE 20 MG PO TABS: 20.0000 mg | ORAL_TABLET | Freq: Every day | ORAL | Status: DC

## 2013-12-23 MED ORDER — DULOXETINE HCL 30 MG PO CPEP: ORAL_CAPSULE | ORAL | Status: DC

## 2013-12-23 MED ORDER — PRAZOSIN HCL 2 MG PO CAPS: 2.0000 mg | ORAL_CAPSULE | Freq: Every day | ORAL | Status: DC
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
## 2014-01-01 ENCOUNTER — Encounter (HOSPITAL_COMMUNITY): Payer: Self-pay | Admitting: Dietician

## 2014-01-01 NOTE — Progress Notes (Signed)
Pt was a no-show for appointment scheduled for 01/01/2014 at 1730.  

## 2014-01-14 ENCOUNTER — Ambulatory Visit (HOSPITAL_COMMUNITY): Payer: Self-pay | Admitting: Psychiatry

## 2014-01-29 ENCOUNTER — Ambulatory Visit (HOSPITAL_COMMUNITY): Payer: Self-pay | Admitting: Psychiatry

## 2014-02-10 ENCOUNTER — Ambulatory Visit (HOSPITAL_COMMUNITY): Payer: Self-pay | Admitting: Psychiatry

## 2014-02-11 ENCOUNTER — Encounter: Payer: Self-pay | Admitting: Adult Health

## 2014-02-11 ENCOUNTER — Ambulatory Visit (INDEPENDENT_AMBULATORY_CARE_PROVIDER_SITE_OTHER): Payer: PRIVATE HEALTH INSURANCE | Admitting: Adult Health

## 2014-02-11 VITALS — BP 138/96 | HR 61 | Ht 60.0 in | Wt 194.0 lb

## 2014-02-11 DIAGNOSIS — R079 Chest pain, unspecified: Secondary | ICD-10-CM

## 2014-02-11 DIAGNOSIS — I10 Essential (primary) hypertension: Secondary | ICD-10-CM

## 2014-02-11 DIAGNOSIS — F17200 Nicotine dependence, unspecified, uncomplicated: Secondary | ICD-10-CM
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
## 2014-02-11 NOTE — Assessment & Plan Note (Signed)
Blood pressure is well controlled. No changes in medications.

## 2014-02-11 NOTE — Progress Notes (Deleted)
Name: TANASHA MENEES    DOB: 01-08-1971  Age: 43 y.o.  MR#: [id number]       PCP:  Alonza Bogus, MD      Insurance: Payor: [employer] MEDICARE / Plan: Cottie Banda / Product Type: *No Product type* /   CC:    Chief Complaint  Patient presents with  . Hypertension  . Palpitations    VS Filed Vitals:   02/11/14 1449  BP: 138/96  Pulse: 61  Height: 5' (1.524 m)  Weight: 194 lb (87.998 kg)  SpO2: 93%    Weights Current Weight  02/11/14 194 lb (87.998 kg)  12/23/13 192 lb (87.091 kg)  09/29/13 194 lb (87.998 kg)    Blood Pressure  BP Readings from Last 3 Encounters:  02/11/14 138/96  12/23/13 140/86  09/29/13 108/74     Admit date:  (Not on file) Last encounter with RMR:  Visit date not found   Allergy Doxycycline; Fentanyl; Iohexol; Ivp dye; Wellbutrin; Latex; and Tape  Current Outpatient Prescriptions  Medication Sig Dispense Refill  . amphetamine-dextroamphetamine (ADDERALL) 20 MG tablet Take 1 tablet (20 mg total) by mouth daily.  30 tablet  0  . amphetamine-dextroamphetamine (ADDERALL) 20 MG tablet Take 1 tablet (20 mg total) by mouth daily.  30 tablet  0  . amphetamine-dextroamphetamine (ADDERALL) 20 MG tablet Take 1 tablet (20 mg total) by mouth daily.  30 tablet  0  . Ascorbic Acid (VITAMIN C) 1000 MG tablet Take 1,000 mg by mouth every morning.       Marland Kitchen azaTHIOprine (IMURAN) 50 MG tablet take 1 tablet by mouth once daily  30 tablet  5  . calcium-vitamin D (OS-CAL 500 + D) 500-200 MG-UNIT per tablet Take 1 tablet by mouth 2 (two) times daily.       . cyanocobalamin 2000 MCG tablet Take 1,500 mcg by mouth daily. Patient is taking 1500 mcg daily.      Marland Kitchen dicyclomine (BENTYL) 10 MG capsule Take 1 capsule (10 mg total) by mouth 3 (three) times daily before meals. For cramps  90 capsule  5  . DULoxetine (CYMBALTA) 30 MG capsule Take one three times a day  90 capsule  2  . esomeprazole (NEXIUM) 40 MG capsule Take 40 mg by mouth every morning.      Marland Kitchen estradiol  (CLIMARA - DOSED IN MG/24 HR) 0.075 mg/24hr Place 1 patch onto the skin once a week.        Marland Kitchen ESZOPICLONE 3 MG tablet Take 3 mg by mouth at bedtime as needed. For sleep      . furosemide (LASIX) 40 MG tablet Take 40 mg by mouth daily.       Marland Kitchen gabapentin (NEURONTIN) 300 MG capsule Take 300 mg by mouth at bedtime as needed.       Marland Kitchen GARLIC PO Take 9,323 mg by mouth daily.       . isosorbide mononitrate (IMDUR) 30 MG 24 hr tablet Take 1 tablet (30 mg total) by mouth daily.  30 each  11  . levETIRAcetam (KEPPRA) 750 MG tablet Take 750 mg by mouth at bedtime.       . lidocaine (LIDODERM) 5 % Place 1 patch onto the skin daily. Remove & Discard patch within 12 hours or as directed by MD  30 patch  0  . lisinopril (PRINIVIL,ZESTRIL) 40 MG tablet Take 40 mg by mouth daily.       . methadone (DOLOPHINE) 5 MG tablet Take 5 mg by mouth  4 (four) times daily as needed. For pain      . metoprolol succinate (TOPROL-XL) 25 MG 24 hr tablet Take 3 tablets (75 mg total) by mouth every morning.  90 tablet  6  . Multiple Vitamins-Minerals (CENTRUM) tablet Take 1 tablet by mouth daily.      Marland Kitchen NEXIUM 40 MG capsule take 1 capsule by mouth every morning  30 capsule  5  . nicotine (NICODERM CQ - DOSED IN MG/24 HOURS) 21 mg/24hr patch Place 21 mg onto the skin daily.      . pramipexole (MIRAPEX) 0.25 MG tablet Take 0.25 mg by mouth at bedtime as needed. For restless legs      . prazosin (MINIPRESS) 2 MG capsule Take 1 capsule (2 mg total) by mouth at bedtime.  30 capsule  2  . PROAIR HFA 108 (90 BASE) MCG/ACT inhaler Inhale 2 puffs into the lungs as needed.       . Probiotic Product (ALIGN PO) Take 1 capsule by mouth daily.        No current facility-administered medications for this visit.    Discontinued Meds:   There are no discontinued medications.  Patient Active Problem List   Diagnosis Date Noted  . Obesity 09/30/2013  . Bilateral hip pain 07/16/2013  . Chronic pain syndrome 07/16/2013  . Degenerative disc  disease, lumbar 07/16/2013  . Greater trochanteric bursitis of both hips 07/16/2013  . Nightmares associated with chronic post-traumatic stress disorder 10/18/2012  . Sprain of foot 03/14/2012  . Weakness 01/29/2012  . Hepatitis, autoimmune 01/29/2012  . IBS (irritable bowel syndrome) 08/01/2011  . Gastroesophageal reflux disease   . Hypertension   . Depression   . Chest pain   . Fasting hyperglycemia   . TOBACCO ABUSE 04/28/2010  . NARCOTIC ABUSE 04/28/2010  . PULMONARY NODULE 04/28/2010  . AUTOIMMUNE HEPATITIS 04/28/2010  . FIBROMYALGIA 01/06/2009    LABS    Component Value Date/Time   NA 138 03/04/2013 0604   NA 145 10/21/2012 0910   NA 138 07/18/2012 1915   K 3.6 03/04/2013 0604   K 4.5 10/21/2012 0910   K 3.5 07/18/2012 1915   CL 100 03/04/2013 0604   CL 109 10/21/2012 0910   CL 103 07/18/2012 1915   CO2 28 03/04/2013 0604   CO2 28 10/21/2012 0910   CO2 29 07/18/2012 1915   GLUCOSE 128* 03/04/2013 0604   GLUCOSE 114* 10/21/2012 0910   GLUCOSE 110* 07/18/2012 1915   BUN 7 03/04/2013 0604   BUN 10 10/21/2012 0910   BUN 7 07/18/2012 1915   CREATININE 0.60 03/04/2013 0604   CREATININE 0.60 10/21/2012 0910   CREATININE 0.59 07/18/2012 1915   CREATININE 0.60 03/14/2012 1400   CREATININE 0.56 01/29/2012 1240   CREATININE 0.59 09/11/2011 1635   CALCIUM 9.6 03/04/2013 0604   CALCIUM 8.8 10/21/2012 0910   CALCIUM 9.0 07/18/2012 1915   GFRNONAA >90 03/04/2013 0604   GFRNONAA >90 07/18/2012 1915   GFRNONAA >90 09/11/2011 1635   GFRAA >90 03/04/2013 0604   GFRAA >90 07/18/2012 1915   GFRAA >90 09/11/2011 1635   CMP     Component Value Date/Time   NA 138 03/04/2013 0604   K 3.6 03/04/2013 0604   CL 100 03/04/2013 0604   CO2 28 03/04/2013 0604   GLUCOSE 128* 03/04/2013 0604   BUN 7 03/04/2013 0604   CREATININE 0.60 03/04/2013 0604   CREATININE 0.60 10/21/2012 0910   CALCIUM 9.6 03/04/2013 0604   PROT 6.6 09/29/2013  1703   ALBUMIN 3.9 09/29/2013 1703   AST 17 09/29/2013 1703   ALT 13 09/29/2013 1703   ALKPHOS 85  09/29/2013 1703   BILITOT 0.3 09/29/2013 1703   GFRNONAA >90 03/04/2013 0604   GFRAA >90 03/04/2013 0604       Component Value Date/Time   WBC 10.7* 09/29/2013 1703   WBC 10.2 03/04/2013 0604   WBC 9.8 07/18/2012 1915   HGB 12.1 09/29/2013 1703   HGB 12.9 03/04/2013 0604   HGB 13.2 07/18/2012 1915   HCT 35.2* 09/29/2013 1703   HCT 36.5 03/04/2013 0604   HCT 37.3 07/18/2012 1915   MCV 90.3 09/29/2013 1703   MCV 89.9 03/04/2013 0604   MCV 91.0 07/18/2012 1915    Lipid Panel     Component Value Date/Time   CHOL 178 03/04/2008   HDL 40 03/04/2008   LDLCALC 108t 03/04/2008    ABG    Component Value Date/Time   PHART 7.389 01/01/2013 1000   PCO2ART 39.0 01/01/2013 1000   PO2ART 89.7 01/01/2013 1000   HCO3 23.1 01/01/2013 1000   TCO2 20.7 01/01/2013 1000   ACIDBASEDEF 1.2 01/01/2013 1000   O2SAT 97.0 01/01/2013 1000     Lab Results  Component Value Date   TSH 3.381 01/29/2012   BNP (last 3 results) No results found for this basename: PROBNP,  in the last 8760 hours Cardiac Panel (last 3 results) No results found for this basename: CKTOTAL, CKMB, TROPONINI, RELINDX,  in the last 72 hours  Iron/TIBC/Ferritin/ %Sat No results found for this basename: iron, tibc, ferritin, ironpctsat     EKG Orders placed in visit on 03/04/13  . EKG 12-LEAD     Prior Assessment and Plan Problem List as of 02/11/2014     Cardiovascular and Mediastinum   Hypertension   Last Assessment & Plan   07/01/2013 Office Visit Written 07/01/2013  3:45 PM by Lendon Colonel, NP     Controlled currently. Continue current medications as directed. Labs per primary.      Respiratory   PULMONARY NODULE     Digestive   AUTOIMMUNE HEPATITIS   Gastroesophageal reflux disease   IBS (irritable bowel syndrome)   Hepatitis, autoimmune     Endocrine   Fasting hyperglycemia     Nervous and Auditory   Nightmares associated with chronic post-traumatic stress disorder     Musculoskeletal and Integument   FIBROMYALGIA    Sprain of foot   Degenerative disc disease, lumbar   Greater trochanteric bursitis of both hips     Other   TOBACCO ABUSE   Last Assessment & Plan   07/01/2013 Office Visit Written 07/01/2013  3:48 PM by Lendon Colonel, NP     Nicoderm patches are now available OTC. She is advised to try these. I will not place her on chantix due to her multiple psychotropic medications. Encouragement to quit.     NARCOTIC ABUSE   Depression   Chest pain   Last Assessment & Plan   07/01/2013 Office Visit Written 07/01/2013  3:45 PM by Lendon Colonel, NP     Found to be negative for cardiac etiology for chest pain. She is not planned for any further cardiac testing at this time.    Weakness   Bilateral hip pain   Chronic pain syndrome   Obesity       Imaging: No results found.

## 2014-02-11 NOTE — Patient Instructions (Signed)
Your physician recommends that you schedule a follow-up appointment in: 1 year with Dr Blima Singer will receive a reminder letter two months in advance reminding you to call and schedule your appointment. If you don't receive this letter, please contact our office.  Your physician recommends that you continue on your current medications as directed. Please refer to the Current Medication list given to you today.

## 2014-02-11 NOTE — Assessment & Plan Note (Signed)
Non cardiac. I think it is more musculoskeletal in the setting of fibromyalgia, with documented normal stress test 2 years ago. She has sleep apnea but is not wearing CPAP. Waking at night short of breath is likely related to this. I have advised her to see PCP Dr. Luan Pulling for recommendations of CPAP alternative masks to allow it to be more comfortable and for improved compliance. No planned cardiac testing at this time.

## 2014-02-11 NOTE — Progress Notes (Signed)
HPI: Lindsay Walsh is a 43 y/o patient of Dr. Johnsie Cancel we are following for hypertension and palpitations with known history of anxiety, tobacco abuse, and fibromyalgia. She was last seen in the office in December of 2014 with multiple non-cardiac somatic complaints. No further cardiac testing was planned as NM study in 2013 was normal   She comes today with multiple somatic complaints. Chronic neck and back pain, bilateral arm pain, and waking at night short of breath. She sometimes as some chest pressure. She has OSA, but has not been wearing her CPAP because she states it did not fit. She is often tired during the day.   Allergies  Allergen Reactions  . Doxycycline Shortness Of Breath and Rash  . Fentanyl Shortness Of Breath, Itching and Other (See Comments)    Heart racing, SOB  . Iohexol Other (See Comments)    Knots on body   . Ivp Dye [Iodinated Diagnostic Agents] Other (See Comments)    Knots on body  . Wellbutrin [Bupropion Hcl] Other (See Comments)    unknown  . Latex Rash  . Tape Rash    Current Outpatient Prescriptions  Medication Sig Dispense Refill  . amphetamine-dextroamphetamine (ADDERALL) 20 MG tablet Take 1 tablet (20 mg total) by mouth daily.  30 tablet  0  . amphetamine-dextroamphetamine (ADDERALL) 20 MG tablet Take 1 tablet (20 mg total) by mouth daily.  30 tablet  0  . amphetamine-dextroamphetamine (ADDERALL) 20 MG tablet Take 1 tablet (20 mg total) by mouth daily.  30 tablet  0  . Ascorbic Acid (VITAMIN C) 1000 MG tablet Take 1,000 mg by mouth every morning.       Marland Kitchen azaTHIOprine (IMURAN) 50 MG tablet take 1 tablet by mouth once daily  30 tablet  5  . calcium-vitamin D (OS-CAL 500 + D) 500-200 MG-UNIT per tablet Take 1 tablet by mouth 2 (two) times daily.       . cyanocobalamin 2000 MCG tablet Take 1,500 mcg by mouth daily. Patient is taking 1500 mcg daily.      Marland Kitchen dicyclomine (BENTYL) 10 MG capsule Take 1 capsule (10 mg total) by mouth 3 (three) times daily  before meals. For cramps  90 capsule  5  . DULoxetine (CYMBALTA) 30 MG capsule Take one three times a day  90 capsule  2  . esomeprazole (NEXIUM) 40 MG capsule Take 40 mg by mouth every morning.      Marland Kitchen estradiol (CLIMARA - DOSED IN MG/24 HR) 0.075 mg/24hr Place 1 patch onto the skin once a week.        Marland Kitchen ESZOPICLONE 3 MG tablet Take 3 mg by mouth at bedtime as needed. For sleep      . furosemide (LASIX) 40 MG tablet Take 40 mg by mouth daily.       Marland Kitchen gabapentin (NEURONTIN) 300 MG capsule Take 300 mg by mouth at bedtime as needed.       Marland Kitchen GARLIC PO Take 3,244 mg by mouth daily.       . isosorbide mononitrate (IMDUR) 30 MG 24 hr tablet Take 1 tablet (30 mg total) by mouth daily.  30 each  11  . levETIRAcetam (KEPPRA) 750 MG tablet Take 750 mg by mouth at bedtime.       . lidocaine (LIDODERM) 5 % Place 1 patch onto the skin daily. Remove & Discard patch within 12 hours or as directed by MD  30 patch  0  . lisinopril (PRINIVIL,ZESTRIL) 40 MG tablet  Take 40 mg by mouth daily.       . methadone (DOLOPHINE) 5 MG tablet Take 5 mg by mouth 4 (four) times daily as needed. For pain      . metoprolol succinate (TOPROL-XL) 25 MG 24 hr tablet Take 3 tablets (75 mg total) by mouth every morning.  90 tablet  6  . Multiple Vitamins-Minerals (CENTRUM) tablet Take 1 tablet by mouth daily.      Marland Kitchen NEXIUM 40 MG capsule take 1 capsule by mouth every morning  30 capsule  5  . nicotine (NICODERM CQ - DOSED IN MG/24 HOURS) 21 mg/24hr patch Place 21 mg onto the skin daily.      . pramipexole (MIRAPEX) 0.25 MG tablet Take 0.25 mg by mouth at bedtime as needed. For restless legs      . prazosin (MINIPRESS) 2 MG capsule Take 1 capsule (2 mg total) by mouth at bedtime.  30 capsule  2  . PROAIR HFA 108 (90 BASE) MCG/ACT inhaler Inhale 2 puffs into the lungs as needed.       . Probiotic Product (ALIGN PO) Take 1 capsule by mouth daily.        No current facility-administered medications for this visit.    Past Medical  History  Diagnosis Date  . Gastroesophageal reflux disease     Chronic abdominal pain; gastroparesis; globus hystericus; irritable bowel syndrome  . Hypertension   . Depression   . Fibromyalgia   . Chest pain     Normal stress echo in 2011; PVCs; pedal edema  . Tobacco abuse     one pack per day; 35 pack years  . Overweight(278.02)   . Narcotic dependence   . Pulmonary nodule   . Fasting hyperglycemia   . Shortness of breath     with exertion  . Sleep apnea   . Autoimmune hepatitis   . Irritable bowel syndrome   . Arthritis     back  . Angina     took SL nitro one week ago  . Dysrhythmia     palpatations  . PTSD (post-traumatic stress disorder)     Past Surgical History  Procedure Laterality Date  . Upper gastrointestinal endoscopy  09/13/2010  . Liver biopsy      x4  . Total abdominal hysterectomy w/ bilateral salpingoophorectomy    . Cesarean section      X3  . Tubal ligation      Bilateral  . Abdominal hysterectomy    . Cholecystectomy    . Bladder suspension  09/12/2011    Procedure: TRANSVAGINAL TAPE (TVT) PROCEDURE;  Surgeon: [name], MD;  Location: [facility];  Service: Urology;  Laterality: N/A;  . Lumbar epidural injection  01/2012    ROS: Review of systems complete and found to be negative unless listed above  PHYSICAL EXAM BP 138/96  Pulse 61  Ht 5' (1.524 m)  Wt 194 lb (87.998 kg)  BMI 37.89 kg/m2  SpO2 93% General: Well developed, well nourished, in no acute distress Head: Eyes PERRLA, No xanthomas.   Normal cephalic and atramatic  Lungs: Clear bilaterally to auscultation and percussion. Heart: HRRR S1 S2, without MRG.  Pulses are 2+ & equal.            No carotid bruit. No JVD.  No abdominal bruits. No femoral bruits. Abdomen: Bowel sounds are positive, abdomen soft and non-tender without masses or  Hernia's noted. Msk:  Back normal, normal gait. Normal strength and tone for age. Extremities: No clubbing, cyanosis or  edema.  DP +1 Neuro: Alert and oriented X 3. Psych:  Good affect, responds appropriately   EKG:  NSR with non-specific T-wave abnormalities. Rate of  75 bpm.  ASSESSMENT AND PLAN

## 2014-02-12 NOTE — Addendum Note (Signed)
Addended by: Barbarann Ehlers A on: 02/12/2014 11:10 AM   Modules accepted: Orders

## 2014-03-07 ENCOUNTER — Other Ambulatory Visit: Payer: Self-pay | Admitting: Adult Health

## 2014-03-24 ENCOUNTER — Other Ambulatory Visit (HOSPITAL_COMMUNITY): Payer: Self-pay | Admitting: Respiratory Therapy

## 2014-03-24 ENCOUNTER — Other Ambulatory Visit (HOSPITAL_COMMUNITY): Payer: Self-pay | Admitting: Pulmonary Disease

## 2014-03-24 DIAGNOSIS — J441 Chronic obstructive pulmonary disease with (acute) exacerbation: Secondary | ICD-10-CM

## 2014-03-24 DIAGNOSIS — Z1231 Encounter for screening mammogram for malignant neoplasm of breast: Secondary | ICD-10-CM

## 2014-03-25 ENCOUNTER — Encounter (HOSPITAL_COMMUNITY): Payer: Self-pay | Admitting: Psychiatry

## 2014-03-25 ENCOUNTER — Ambulatory Visit (INDEPENDENT_AMBULATORY_CARE_PROVIDER_SITE_OTHER): Payer: PRIVATE HEALTH INSURANCE | Admitting: Psychiatry

## 2014-03-25 VITALS — BP 150/100 | HR 89 | Ht 60.0 in | Wt 199.0 lb

## 2014-03-25 DIAGNOSIS — F331 Major depressive disorder, recurrent, moderate: Secondary | ICD-10-CM

## 2014-03-25 DIAGNOSIS — F4310 Post-traumatic stress disorder, unspecified: Secondary | ICD-10-CM

## 2014-03-25 DIAGNOSIS — F515 Nightmare disorder: Secondary | ICD-10-CM

## 2014-03-25 DIAGNOSIS — IMO0002 Reserved for concepts with insufficient information to code with codable children: Secondary | ICD-10-CM

## 2014-03-25 MED ORDER — AMPHETAMINE-DEXTROAMPHETAMINE 20 MG PO TABS: 20.0000 mg | ORAL_TABLET | Freq: Every day | ORAL | Status: DC

## 2014-03-25 MED ORDER — DULOXETINE HCL 60 MG PO CPEP
60.0000 mg | ORAL_CAPSULE | Freq: Two times a day (BID) | ORAL | Status: DC
Start: 2014-03-25 — End: 2014-04-17

## 2014-03-25 MED ORDER — PRAZOSIN HCL 2 MG PO CAPS: 2.0000 mg | ORAL_CAPSULE | Freq: Every day | ORAL | Status: DC
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
## 2014-03-27 ENCOUNTER — Other Ambulatory Visit (HOSPITAL_COMMUNITY): Payer: Self-pay | Admitting: Respiratory Therapy

## 2014-03-27 DIAGNOSIS — G4730 Sleep apnea, unspecified: Secondary | ICD-10-CM

## 2014-03-30 ENCOUNTER — Encounter: Payer: Self-pay | Admitting: Adult Health

## 2014-03-31 ENCOUNTER — Encounter: Payer: Self-pay | Admitting: *Deleted

## 2014-04-01 ENCOUNTER — Ambulatory Visit (HOSPITAL_COMMUNITY)
Admission: RE | Admit: 2014-04-01 | Discharge: 2014-04-01 | Disposition: A | Discharge status: Home / Self Care | Payer: PRIVATE HEALTH INSURANCE | Source: Ambulatory Visit | Attending: Pulmonary Disease | Admitting: Pulmonary Disease

## 2014-04-01 DIAGNOSIS — R0989 Other specified symptoms and signs involving the circulatory and respiratory systems: Principal | ICD-10-CM | POA: Insufficient documentation

## 2014-04-01 DIAGNOSIS — R0609 Other forms of dyspnea: Secondary | ICD-10-CM | POA: Insufficient documentation

## 2014-04-01 MED ORDER — ALBUTEROL SULFATE (2.5 MG/3ML) 0.083% IN NEBU
2.5000 mg | INHALATION_SOLUTION | Freq: Once | RESPIRATORY_TRACT | Status: AC
  Administered 2014-04-01: 2.5 mg via RESPIRATORY_TRACT

## 2014-04-02 ENCOUNTER — Encounter: Payer: Self-pay | Admitting: *Deleted

## 2014-04-06 ENCOUNTER — Encounter (INDEPENDENT_AMBULATORY_CARE_PROVIDER_SITE_OTHER): Payer: Self-pay | Admitting: Internal Medicine

## 2014-04-06 ENCOUNTER — Ambulatory Visit (INDEPENDENT_AMBULATORY_CARE_PROVIDER_SITE_OTHER): Payer: PRIVATE HEALTH INSURANCE | Admitting: Internal Medicine

## 2014-04-06 VITALS — BP 130/80 | HR 76 | Temp 97.5°F | Resp 18 | Ht 60.0 in | Wt 199.5 lb

## 2014-04-06 DIAGNOSIS — K219 Gastro-esophageal reflux disease without esophagitis: Secondary | ICD-10-CM

## 2014-04-06 DIAGNOSIS — K589 Irritable bowel syndrome without diarrhea: Secondary | ICD-10-CM

## 2014-04-06 DIAGNOSIS — K754 Autoimmune hepatitis: Secondary | ICD-10-CM

## 2014-04-06 NOTE — Progress Notes (Signed)
Presenting complaint;  Followup for autoimmune hepatitis, GERD and IBS.  Subjective:  Patient is 43 year old Caucasian female who is here for a scheduled visit. She was last seen 6 months ago. She brings along a copy of her blood work and wants me to review it. This blood work was done by Dr. Luan Pulling. She is afraid that she may develop diabetes because she craves sweets and has continued to gain weight. She states family history is positive for diabetes. She tells me she has an appointment with avulsed famous doctor at [facility] to help her lose weight. She denies abdominal pain. She complains of intermittent bloating. At times she feels she is pregnant. She is using Gas-X which is helping. She occasionally has pain over her right rib cage not like she used to. Heart is well controlled with therapy. She denies Nausea vomiting or dysphagia. She still has intermittent postprandial diarrhea but she denies melena or rectal bleeding. She has gained 6 pounds since her last visit.   Current Medications: Outpatient Encounter Prescriptions as of 04/06/2014  Medication Sig  . amphetamine-dextroamphetamine (ADDERALL) 20 MG tablet Take 1 tablet (20 mg total) by mouth daily.  . Ascorbic Acid (VITAMIN C) 1000 MG tablet Take 1,000 mg by mouth every morning.   Marland Kitchen azaTHIOprine (IMURAN) 50 MG tablet take 1 tablet by mouth once daily  . calcium-vitamin D (OS-CAL 500 + D) 500-200 MG-UNIT per tablet Take 1 tablet by mouth 2 (two) times daily.   . cyanocobalamin 2000 MCG tablet Take 1,500 mcg by mouth daily. Patient is taking 1500 mcg daily.  Marland Kitchen dicyclomine (BENTYL) 10 MG capsule Take 1 capsule (10 mg total) by mouth 3 (three) times daily before meals. For cramps  . DULoxetine (CYMBALTA) 60 MG capsule Take 1 capsule (60 mg total) by mouth 2 (two) times daily.  Marland Kitchen estradiol (CLIMARA - DOSED IN MG/24 HR) 0.075 mg/24hr Place 1 patch onto the skin once a week.    Marland Kitchen ESZOPICLONE 3 MG tablet Take 3 mg by mouth at bedtime as needed.  For sleep  . furosemide (LASIX) 40 MG tablet Take 40 mg by mouth daily.   Marland Kitchen gabapentin (NEURONTIN) 300 MG capsule Take 300 mg by mouth at bedtime as needed.   Marland Kitchen GARLIC PO Take 6,734 mg by mouth daily.   . isosorbide mononitrate (IMDUR) 30 MG 24 hr tablet TAKE ONE (1) TABLET EACH DAY  . levETIRAcetam (KEPPRA) 750 MG tablet Take 750 mg by mouth at bedtime.   . lidocaine (LIDODERM) 5 % Place 1 patch onto the skin daily. Remove & Discard patch within 12 hours or as directed by MD  . lisinopril (PRINIVIL,ZESTRIL) 40 MG tablet Take 40 mg by mouth daily.   . methadone (DOLOPHINE) 5 MG tablet Take 5 mg by mouth 4 (four) times daily as needed. For pain  . metoprolol succinate (TOPROL-XL) 25 MG 24 hr tablet Take 3 tablets (75 mg total) by mouth every morning.  . Multiple Vitamins-Minerals (CENTRUM) tablet Take 1 tablet by mouth daily.  Marland Kitchen NEXIUM 40 MG capsule take 1 capsule by mouth every morning  . pramipexole (MIRAPEX) 0.25 MG tablet Take 0.25 mg by mouth at bedtime as needed. For restless legs  . prazosin (MINIPRESS) 2 MG capsule Take 1 capsule (2 mg total) by mouth at bedtime.  Marland Kitchen PROAIR HFA 108 (90 BASE) MCG/ACT inhaler Inhale 2 puffs into the lungs as needed.   . Probiotic Product (ALIGN PO) Take 1 capsule by mouth daily.   . nicotine (NICODERM  CQ - DOSED IN MG/24 HOURS) 21 mg/24hr patch Place 21 mg onto the skin daily.  . [DISCONTINUED] amphetamine-dextroamphetamine (ADDERALL) 20 MG tablet Take 1 tablet (20 mg total) by mouth daily.  . [DISCONTINUED] amphetamine-dextroamphetamine (ADDERALL) 20 MG tablet Take 1 tablet (20 mg total) by mouth daily.  . [DISCONTINUED] DULoxetine (CYMBALTA) 30 MG capsule Take one three times a day  . [DISCONTINUED] esomeprazole (NEXIUM) 40 MG capsule Take 40 mg by mouth every morning.     Objective: Blood pressure 130/80, pulse 76, temperature 97.5 F (36.4 C), temperature source Oral, resp. rate 18, height 5' (1.524 m), weight 199 lb 8 oz (90.493 kg). Patient is  alert and does not have asterixis. Conjunctiva is pink. Sclera is nonicteric Oropharyngeal mucosa is normal. No neck masses or thyromegaly noted. Cardiac exam with regular rhythm normal S1 and S2. No murmur or gallop noted. Lungs are clear to auscultation. Abdomen is full but soft and nontender without organomegaly or masses.  No LE edema or clubbing noted.  Labs/studies Results: Lab data from 03/24/2014  WBC 8.3, H&H 12.8 and 37.4 and platelet count 261K  ANC 4700  Paraben 0.5, AP 81, AST 19, ALT 20, total protein 6.6, albumin 3.8 and globulin 2.8. Calcium is 8.8. Serum sodium 140, potassium 4.0, protein 105, CO2 28, BUN 7, creatinine 0.56 and glucose 118  TSH 5.456    Assessment:  #1. Autoimmune hepatitis. Patient remains in biochemical remission. Disease was diagnosed about 13 years ago. #2.GERD. Symptoms are well controlled with therapy. #3. Irritable bowel syndrome. Symptom control is not satisfactory I am reluctant to increase dicyclomine for fear of more side effects and less benefit. #4. Obesity. Patient is aware that she is at risk for fatty liver disease. She has an appointment to see a physician at [facility] to review her treatment options for weight loss. #5. Bloating. Patient advised to decrease intake of carbohydrates and she will try probiotic.  Plan:  Patient will sent as a copy of her next blood work to be done and of this year. Can try probiotic OTC daily for bloating. Office visit in 6 months.

## 2014-04-06 NOTE — Patient Instructions (Signed)
Please send us a copy of your next blood work 

## 2014-04-07 LAB — PULMONARY FUNCTION TEST
DL/VA % PRED: 96 %
DL/VA: 4.11 ml/min/mmHg/L
DLCO COR % PRED: 81 %
DLCO UNC: 15.39 ml/min/mmHg
DLCO cor: 15.39 ml/min/mmHg
DLCO unc % pred: 81 %
FEF 25-75 POST: 2.68 L/s
FEF 25-75 Pre: 3.18 L/sec
FEF2575-%CHANGE-POST: -15 %
FEF2575-%PRED-POST: 97 %
FEF2575-%PRED-PRE: 115 %
FEV1-%Change-Post: -3 %
FEV1-%PRED-PRE: 93 %
FEV1-%Pred-Post: 90 %
FEV1-POST: 2.32 L
FEV1-PRE: 2.4 L
FEV1FVC-%CHANGE-POST: 2 %
FEV1FVC-%PRED-PRE: 106 %
FEV6-%CHANGE-POST: -5 %
FEV6-%PRED-POST: 84 %
FEV6-%Pred-Pre: 90 %
FEV6-PRE: 2.79 L
FEV6-Post: 2.63 L
FEV6FVC-%Pred-Post: 102 %
FEV6FVC-%Pred-Pre: 102 %
FVC-%Change-Post: -5 %
FVC-%PRED-POST: 83 %
FVC-%Pred-Pre: 88 %
FVC-Post: 2.63 L
FVC-Pre: 2.79 L
POST FEV1/FVC RATIO: 88 %
POST FEV6/FVC RATIO: 100 %
Pre FEV1/FVC ratio: 86 %
Pre FEV6/FVC Ratio: 100 %
RV % PRED: 118 %
RV: 1.74 L
TLC % pred: 101 %
TLC: 4.52 L

## 2014-04-08 ENCOUNTER — Encounter: Payer: Self-pay | Admitting: Adult Health

## 2014-04-09 ENCOUNTER — Ambulatory Visit: Payer: PRIVATE HEALTH INSURANCE | Attending: Pulmonary Disease | Admitting: Sleep Medicine

## 2014-04-09 VITALS — Ht 60.0 in | Wt 199.0 lb

## 2014-04-09 DIAGNOSIS — G4733 Obstructive sleep apnea (adult) (pediatric): Secondary | ICD-10-CM | POA: Diagnosis not present

## 2014-04-09 DIAGNOSIS — I493 Ventricular premature depolarization: Secondary | ICD-10-CM | POA: Diagnosis not present

## 2014-04-09 DIAGNOSIS — E669 Obesity, unspecified: Secondary | ICD-10-CM | POA: Insufficient documentation

## 2014-04-09 DIAGNOSIS — G4710 Hypersomnia, unspecified: Secondary | ICD-10-CM | POA: Insufficient documentation

## 2014-04-09 DIAGNOSIS — R5383 Other fatigue: Secondary | ICD-10-CM | POA: Diagnosis present

## 2014-04-09 DIAGNOSIS — Z6839 Body mass index (BMI) 39.0-39.9, adult: Secondary | ICD-10-CM | POA: Diagnosis not present

## 2014-04-17 ENCOUNTER — Telehealth (HOSPITAL_COMMUNITY): Payer: Self-pay | Admitting: *Deleted

## 2014-04-17 ENCOUNTER — Other Ambulatory Visit (HOSPITAL_COMMUNITY): Payer: Self-pay | Admitting: *Deleted

## 2014-04-17 DIAGNOSIS — F4310 Post-traumatic stress disorder, unspecified: Secondary | ICD-10-CM

## 2014-04-17 DIAGNOSIS — F515 Nightmare disorder: Principal | ICD-10-CM

## 2014-04-17 MED ORDER — PRAZOSIN HCL 2 MG PO CAPS: 2.0000 mg | ORAL_CAPSULE | Freq: Every day | ORAL | Status: DC

## 2014-04-17 MED ORDER — DULOXETINE HCL 60 MG PO CPEP: 60.0000 mg | ORAL_CAPSULE | Freq: Two times a day (BID) | ORAL | Status: DC

## 2014-04-17 NOTE — Telephone Encounter (Signed)
Please send them there

## 2014-04-17 NOTE — Telephone Encounter (Signed)
Pt is aware medications will be sent to [facility]

## 2014-04-17 NOTE — Telephone Encounter (Signed)
Per Dr. Harrington Challenger to go ahead and send pt medications to [facility] in [place] due to pt request

## 2014-04-17 NOTE — Telephone Encounter (Signed)
Pt pharmacy [facility]) is requesting pt Cymbalta an Prazosin. Per pt she no longer go to the pharmacy that we sent her medications to. Pt would like her meds to be sent to [facility] in [place].

## 2014-04-18 NOTE — Sleep Study (Signed)
[place] A. Merlene Laughter, MD     [email or web address]        NOCTURNAL POLYSOMNOGRAM    LOCATION: SLEEP LAB FACILITY: [facility]   PHYSICIAN: Corah Willeford A. Merlene Walsh, M.D.   DATE OF STUDY: 04/09/2014.   REFERRING PHYSICIAN: Sinda Du.   INDICATIONS: This is a 43 year old who presents with hypersomnia, fatigue and obesity.  MEDICATIONS:  Prior to Admission medications   Medication Sig Start Date End Date Taking? Authorizing Provider  amphetamine-dextroamphetamine (ADDERALL) 20 MG tablet Take 1 tablet (20 mg total) by mouth daily. 03/25/14 03/25/15  [name], MD  Ascorbic Acid (VITAMIN C) 1000 MG tablet Take 1,000 mg by mouth every morning.     Historical Provider, MD  azaTHIOprine (IMURAN) 50 MG tablet take 1 tablet by mouth once daily 11/07/13   Rogene Houston, MD  calcium-vitamin D (OS-CAL 500 + D) 500-200 MG-UNIT per tablet Take 1 tablet by mouth 2 (two) times daily.     Historical Provider, MD  cyanocobalamin 2000 MCG tablet Take 1,500 mcg by mouth daily. Patient is taking 1500 mcg daily.    Historical Provider, MD  dicyclomine (BENTYL) 10 MG capsule Take 1 capsule (10 mg total) by mouth 3 (three) times daily before meals. For cramps 09/30/12   Rogene Houston, MD  DULoxetine (CYMBALTA) 60 MG capsule Take 1 capsule (60 mg total) by mouth 2 (two) times daily. 04/17/14 04/17/15  [name], MD  estradiol (CLIMARA - DOSED IN MG/24 HR) 0.075 mg/24hr Place 1 patch onto the skin once a week.      Historical Provider, MD  ESZOPICLONE 3 MG tablet Take 3 mg by mouth at bedtime as needed. For sleep 04/18/12   Alonza Bogus, MD  furosemide (LASIX) 40 MG tablet Take 40 mg by mouth daily.     Historical Provider, MD  gabapentin (NEURONTIN) 300 MG capsule Take 300 mg by mouth at bedtime as needed.     Historical Provider, MD  GARLIC PO Take 5,176 mg by mouth daily.     Historical Provider, MD  isosorbide mononitrate (IMDUR) 30 MG 24 hr tablet TAKE ONE (1) TABLET EACH DAY  03/09/14   Lendon Colonel, NP  levETIRAcetam (KEPPRA) 750 MG tablet Take 750 mg by mouth at bedtime.     Historical Provider, MD  lidocaine (LIDODERM) 5 % Place 1 patch onto the skin daily. Remove & Discard patch within 12 hours or as directed by MD 09/29/13   Rogene Houston, MD  lisinopril (PRINIVIL,ZESTRIL) 40 MG tablet Take 40 mg by mouth daily.  09/23/12   Historical Provider, MD  methadone (DOLOPHINE) 5 MG tablet Take 5 mg by mouth 4 (four) times daily as needed. For pain 06/23/12   Historical Provider, MD  metoprolol succinate (TOPROL-XL) 25 MG 24 hr tablet Take 3 tablets (75 mg total) by mouth every morning. 10/08/13   Lendon Colonel, NP  Multiple Vitamins-Minerals (CENTRUM) tablet Take 1 tablet by mouth daily.    Historical Provider, MD  NEXIUM 40 MG capsule take 1 capsule by mouth every morning    Rogene Houston, MD  nicotine (NICODERM CQ - DOSED IN MG/24 HOURS) 21 mg/24hr patch Place 21 mg onto the skin daily.    Historical Provider, MD  pramipexole (MIRAPEX) 0.25 MG tablet Take 0.25 mg by mouth at bedtime as needed. For restless legs    Historical Provider, MD  prazosin (MINIPRESS) 2 MG capsule Take 1 capsule (2 mg total) by mouth at bedtime. 04/17/14  [name], MD  PROAIR HFA 108 [phone] BASE) MCG/ACT inhaler Inhale 2 puffs into the lungs as needed.  12/16/12   Historical Provider, MD  Probiotic Product (ALIGN PO) Take 1 capsule by mouth daily.     Historical Provider, MD      EPWORTH SLEEPINESS SCALE: 14.   BMI: 39.   ARCHITECTURAL SUMMARY: Total recording time was 360 minutes. Sleep efficiency 72 %. Sleep latency 28 minutes. REM latency 225 minutes. Stage NI 6 %, N2 79 % and N3 10 % and REM sleep 5 %.    RESPIRATORY DATA:  The study is a split-night recording with the initial portion be diagnostic in the second portion a titration recording. Baseline oxygen saturation is 99 %. The lowest saturation is 86 %. The diagnostic AHI is 45. The patient was placed on positive pressure  starting at 5 and increased to 12. Optimal pressure is 12 with resolution of obstructive events and good tolerance.  LIMB MOVEMENT SUMMARY: PLM index 0.   ELECTROCARDIOGRAM SUMMARY: Average heart rate is 76. Frequent PVCs are observed.  IMPRESSION:  1. Severe obstructive sleep apnea syndrome which responds well to a CPAP of 12. 2. Frequent premature ventricular contractures/complexes.  Thanks for this referral.  Elis Rawlinson A. Merlene Walsh, M.D. Diplomat, Tax adviser of Sleep Medicine.

## 2014-04-24 ENCOUNTER — Other Ambulatory Visit: Payer: Self-pay

## 2014-05-04 ENCOUNTER — Other Ambulatory Visit (HOSPITAL_COMMUNITY): Payer: Self-pay | Admitting: Pulmonary Disease

## 2014-05-04 ENCOUNTER — Ambulatory Visit (HOSPITAL_COMMUNITY): Payer: Self-pay

## 2014-05-04 DIAGNOSIS — Z1231 Encounter for screening mammogram for malignant neoplasm of breast: Secondary | ICD-10-CM

## 2014-05-06 ENCOUNTER — Ambulatory Visit (INDEPENDENT_AMBULATORY_CARE_PROVIDER_SITE_OTHER): Payer: Medicare Other | Admitting: Adult Health

## 2014-05-06 ENCOUNTER — Encounter: Payer: Self-pay | Admitting: Adult Health

## 2014-05-06 ENCOUNTER — Other Ambulatory Visit (HOSPITAL_COMMUNITY)
Admission: RE | Admit: 2014-05-06 | Discharge: 2014-05-06 | Disposition: A | Discharge status: Home / Self Care | Payer: PRIVATE HEALTH INSURANCE | Source: Ambulatory Visit | Attending: Adult Health | Admitting: Adult Health

## 2014-05-06 VITALS — BP 150/80 | Ht 60.0 in | Wt 196.5 lb

## 2014-05-06 DIAGNOSIS — Z1212 Encounter for screening for malignant neoplasm of rectum: Secondary | ICD-10-CM

## 2014-05-06 DIAGNOSIS — Z01419 Encounter for gynecological examination (general) (routine) without abnormal findings: Secondary | ICD-10-CM | POA: Diagnosis present

## 2014-05-06 DIAGNOSIS — IMO0002 Reserved for concepts with insufficient information to code with codable children: Secondary | ICD-10-CM

## 2014-05-06 DIAGNOSIS — Z1151 Encounter for screening for human papillomavirus (HPV): Secondary | ICD-10-CM | POA: Diagnosis present

## 2014-05-06 DIAGNOSIS — Z124 Encounter for screening for malignant neoplasm of cervix: Secondary | ICD-10-CM

## 2014-05-06 DIAGNOSIS — N941 Dyspareunia: Secondary | ICD-10-CM

## 2014-05-06 HISTORY — DX: Reserved for concepts with insufficient information to code with codable children: IMO0002

## 2014-05-06 LAB — HEMOCCULT GUIAC POC 1CARD (OFFICE): FECAL OCCULT BLD: NEGATIVE

## 2014-05-06 MED ORDER — ESTRADIOL 0.1 MG/GM VA CREA: TOPICAL_CREAM | VAGINAL | Status: DC

## 2014-05-06 NOTE — Patient Instructions (Signed)
Dyspareunia Dyspareunia is pain during sexual intercourse. It is most common in women, but it also happens in men.  CAUSES  Female The pain from this condition is usually felt when anything is put into the vagina, but any part of the genitals may cause pain during sex. Even sitting or wearing pants can cause pain. Sometimes, a cause cannot be found. Some causes of pain during intercourse are:  Infections of the skin around the vagina.  Vaginal infections, such as a yeast, bacterial, or viral infection.  Vaginismus. This is the inability to have anything put in the vagina even when the woman wants it to happen. There is an automatic muscle contraction and pain. The pain of the muscle contraction can be so severe that intercourse is impossible.  Allergic reaction from spermicides, semen, condoms, scented tampons, soaps, douches, and vaginal sprays.  A fluid-filled sac (cyst) on the Bartholin or Skene glands, located at the opening of the vagina.  Scar tissue in the vagina from a surgically enlarged opening (episiotomy) or tearing after delivering a baby.  Vaginal dryness. This is more common in menopause. The normal secretions of the vagina are decreased. Changes in estrogen levels and increased difficulty becoming aroused can cause painful sex. Vaginal dryness can also happen when taking birth control pills.  Thinning of the tissue (atrophy) of the vulva and vagina. This makes the area thinner, smaller, unable to stretch to accommodate a penis, and prone to infection and tearing.  Vulvar vestibulitis or vestibulodynia.This is a condition that causes pain involving the area around the entrance to the vagina.The most common cause in young women is birth control pills.Women with low estrogen levels (postmenopausal women) may also experience this.Other causes include allergic reactions, too many nerve endings, skin conditions, and pelvic muscles that cannot relax.  Vulvar dermatoses. This  includes skin conditions such as lichen sclerosus and lichen planus.  Lack of foreplay to lubricate the vagina. This can cause vaginal dryness.  Noncancerous tumors (fibroids) in the uterus.  Uterus lining tissue growing outside the uterus (endometriosis).  Pregnancy that starts in the fallopian tube (tubal pregnancy).  Pregnancy or breastfeeding your baby. This can cause vaginal dryness.  A tilting or prolapse of the uterus. Prolapse is when weak and stretched muscles around the uterus allow it to fall into the vagina.  Problems with the ovaries, cysts, or scar tissue. This may be worse with certain sexual positions.  Previous surgeries causing adhesions or scar tissue in the vagina or pelvis.  Bladder and intestinal problems.  Psychological problems (such as depression or anxiety). This may make pain worse.  Negative attitudes about sex, experiencing rape, sexual assault, and misinformation about sex. These issues are often related to some types of pain.  Previous pelvic infection, causing scar tissue in the pelvis and on the female organs.  Cyst or tumor on the ovary.  Cancer of the female organs.  Certain medicines.  Medical problems such as diabetes, arthritis, or thyroid disease. Female In men, there are many physical causes of sexual discomfort. Some causes of pain during intercourse are:  Infections of the prostate, bladder, or seminal vesicles. This can cause pain after ejaculation.  An inflamed bladder (interstitial cystitis). This may cause pain from ejaculation.  Gonorrheal infections. This may cause pain during ejaculation.  An inflamed urethra (urethritis) or inflamed prostate (prostatitis). This can make genital stimulation painful or uncomfortable.  Deformities of the penis, such as Peyronie's disease.  A tight foreskin.  Cancer of the female organs.    Psychological problems. This may make pain worse. DIAGNOSIS   Your caregiver will take a history and  have you describe where the pain is located (outside the vagina, in the vagina, in the pelvis). You may be asked when you experience pain, such as with penetration or with thrusting.  Following this, your caregiver will do a physical exam. Let your caregiver know if the exam is too painful.  During the final part of the female exam, your caregiver will feel your uterus and ovaries with one hand on the abdomen and one finger in your vagina. This is a pelvic exam.  Blood tests, a Pap test, cultures for infection, an ultrasound test, and X-rays may be done. You may need to see a specialist for female problems (gynecologist).  Your caregiver may do a CT scan, MRI, or laparoscopy. Laparoscopy is a procedure to look into the pelvis with a lighted tube, through a cut (incision) in the abdomen. TREATMENT  Your caregiver can help you determine the best course of treatment. Sometimes, more testing is done. Continue with the suggested testing until your caregiver feels sure about your diagnosis and how to treat it. Sometimes, it is difficult to find the reason for the pain. The search for the cause and treatment can be frustrating. Treatment often takes several weeks to a few months before you notice any improvement. You may also need to avoid sexual activity until symptoms improve.Continuing to have sex when it hurts can delay healing and actually make the problem worse. The treatment depends on the cause of the pain. Treatment may include:  Medicines such as antibiotics, vaginal or skin creams, hormones, or antidepressants.  Minor or major surgery.  Psychological counseling or group therapy.  Kegel exercises and vaginal dilators to help certain cases of vaginismus (spasms). Do this only if recommended by your caregiver.Kegel exercises can make some problems worse.  Applying lubrication as recommended by your caregiver if you have dryness.  Sex therapy for you and your sex partner. It is common for  the pain to continue after the reason for the pain has been treated. Some reasons for this include a conditioned response. This means the person having the pain becomes so familiar with the pain that the pain continues as a response, even though the cause is removed. Sex therapy can help with this problem. HOME CARE INSTRUCTIONS   Follow your caregiver's instructions about taking medicines, tests, counseling, and follow-up treatment.  Do not use scented tampons, douches, vaginal sprays, or soaps.  Use water-based lubricants for dryness. Oil lubricants can cause irritation.  Do not use spermicides or condoms that irritate you.  Openly discuss with your partner your sexual experience, your desires, foreplay, and different sexual positions for a more comfortable and enjoyable sexual relationship.  Join group sessions for therapy, if needed.  Practice safe sex at all times.  Empty your bladder before having intercourse.  Try different positions during sexual intercourse.  Take over-the-counter pain medicine recommended by your caregiver before having sexual intercourse.  Do not wear pantyhose. Knee-high and thigh-high hose are okay.  Avoid scrubbing your vulva with a washcloth. Wash the area gently and pat dry with a towel. SEEK MEDICAL CARE IF:   You develop vaginal bleeding after sexual intercourse.  You develop a lump at the opening of your vagina, even if it is not painful.  You have abnormal vaginal discharge.  You have vaginal dryness.  You have itching or irritation of the vulva or vagina.  You   develop a rash or reaction to your medicine. SEEK IMMEDIATE MEDICAL CARE IF:   You develop severe abdominal pain during or shortly after sexual intercourse. You could have a ruptured ovarian cyst or ruptured tubal pregnancy.  You have a fever.  You have painful or bloody urination.  You have painful sexual intercourse, and you never had it before.  You pass out after having  sexual intercourse. Document Released: 07/16/2007 Document Revised: 09/18/2011 Document Reviewed: 09/26/2010 [facility] Patient Information 2015 [place], [place]. This information is not intended to replace advice given to you by your health care provider. Make sure you discuss any questions you have with your health care provider. Use estrace cream 1 gm in vagina at hs x 2 weeks then 2-3 x weekly USE ASTRO GLIDE with sex Follow up in 4 weeks

## 2014-05-06 NOTE — Progress Notes (Signed)
Subjective:     Patient ID: LAASIA ARCOS, female   DOB: [birth year], 43 y.o.   MRN: [id number]  HPI Jasmeen is a 43 year old white female, married, in for a pap smear, she had physical 03/20/14 with Dr Luan Pulling and got flu shot.She is complaining of pain with sex and has history of BV.She had a hysterectomy in 2002 by Dr Isaiah Blakes.  Review of Systems See HPI Reviewed past medical,surgical, social and family history. Reviewed medications and allergies.     Objective:   Physical Exam BP 150/80  Ht 5' (1.524 m)  Wt 196 lb 8 oz (89.132 kg)  BMI 38.38 kg/m2   Skin warm and dry.Pelvic: external genitalia is normal in appearance for age, no lesions, vagina:pale with loss of rugae and moisture and exam was uncomfortable, cervix:smooth, and atrophic,pap with HPV performed, uterus: absent, adnexa: no masses or tenderness noted.  On rectal exam has good sphincter tone, no hemorrhoids or polyps felt and hemoccult was negative.  Assessment:     Encounter for cervical pap and pelvic exam Dyspareunia     Plan:     Rx estrace cream use 1 gm in vagina at hs x 2 weeks then 2-3 x weekly with 3 refills Use astro glide with sex,but wait 2 weeks before having sex  Follow up in 4 weeks for recheck Review handout on dyspareunia

## 2014-05-07 ENCOUNTER — Ambulatory Visit (HOSPITAL_COMMUNITY)
Admission: RE | Admit: 2014-05-07 | Discharge: 2014-05-07 | Disposition: A | Discharge status: Home / Self Care | Payer: PRIVATE HEALTH INSURANCE | Source: Ambulatory Visit | Attending: Pulmonary Disease | Admitting: Pulmonary Disease

## 2014-05-07 DIAGNOSIS — Z1231 Encounter for screening mammogram for malignant neoplasm of breast: Secondary | ICD-10-CM | POA: Diagnosis present

## 2014-05-08 LAB — CYTOLOGY - PAP

## 2014-05-11 ENCOUNTER — Encounter: Payer: Self-pay | Admitting: Adult Health

## 2014-05-18 ENCOUNTER — Other Ambulatory Visit (INDEPENDENT_AMBULATORY_CARE_PROVIDER_SITE_OTHER): Payer: Self-pay | Admitting: Internal Medicine

## 2014-05-18 ENCOUNTER — Other Ambulatory Visit: Payer: Self-pay | Admitting: Adult Health

## 2014-06-03 ENCOUNTER — Ambulatory Visit: Payer: Medicare Other | Admitting: Adult Health

## 2014-06-12 ENCOUNTER — Telehealth: Payer: Self-pay | Admitting: Adult Health

## 2014-06-12 ENCOUNTER — Ambulatory Visit: Payer: Medicare Other | Admitting: Adult Health

## 2014-06-12 NOTE — Telephone Encounter (Signed)
Voice mail not set up @ 12:39pm. JSY

## 2014-06-15 NOTE — Telephone Encounter (Signed)
Voice mail not set up @ 9:27 am. [employer]

## 2014-06-17 NOTE — Telephone Encounter (Signed)
Voice mail not set up @ 10:57 am. I have tried 3 different times to reach pt. Encounter closed. [place]

## 2014-06-24 ENCOUNTER — Encounter (INDEPENDENT_AMBULATORY_CARE_PROVIDER_SITE_OTHER): Payer: Self-pay

## 2014-06-24 ENCOUNTER — Ambulatory Visit (INDEPENDENT_AMBULATORY_CARE_PROVIDER_SITE_OTHER): Payer: PRIVATE HEALTH INSURANCE | Admitting: Psychiatry

## 2014-06-24 ENCOUNTER — Encounter (HOSPITAL_COMMUNITY): Payer: Self-pay | Admitting: Psychiatry

## 2014-06-24 VITALS — BP 140/90 | HR 88 | Ht 60.0 in | Wt 197.6 lb

## 2014-06-24 DIAGNOSIS — F331 Major depressive disorder, recurrent, moderate: Secondary | ICD-10-CM

## 2014-06-24 DIAGNOSIS — F515 Nightmare disorder: Secondary | ICD-10-CM

## 2014-06-24 DIAGNOSIS — F4310 Post-traumatic stress disorder, unspecified: Secondary | ICD-10-CM

## 2014-06-24 DIAGNOSIS — F332 Major depressive disorder, recurrent severe without psychotic features: Secondary | ICD-10-CM

## 2014-06-24 DIAGNOSIS — F4312 Post-traumatic stress disorder, chronic: Secondary | ICD-10-CM

## 2014-06-24 MED ORDER — PRAZOSIN HCL 2 MG PO CAPS: 2.0000 mg | ORAL_CAPSULE | Freq: Every day | ORAL | Status: DC

## 2014-06-24 MED ORDER — AMPHETAMINE-DEXTROAMPHETAMINE 20 MG PO TABS: 20.0000 mg | ORAL_TABLET | Freq: Every day | ORAL | Status: DC

## 2014-06-24 MED ORDER — DULOXETINE HCL 60 MG PO CPEP: 60.0000 mg | ORAL_CAPSULE | Freq: Two times a day (BID) | ORAL | Status: DC
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
## 2014-07-10 DIAGNOSIS — I639 Cerebral infarction, unspecified: Secondary | ICD-10-CM

## 2014-07-10 HISTORY — DX: Cerebral infarction, unspecified: I63.9

## 2014-07-14 ENCOUNTER — Ambulatory Visit: Payer: Medicaid Other | Admitting: Adult Health

## 2014-07-14 ENCOUNTER — Encounter: Payer: Self-pay | Admitting: Adult Health

## 2014-07-14 DIAGNOSIS — M791 Myalgia: Secondary | ICD-10-CM | POA: Diagnosis not present

## 2014-07-14 DIAGNOSIS — M47816 Spondylosis without myelopathy or radiculopathy, lumbar region: Secondary | ICD-10-CM | POA: Diagnosis not present

## 2014-07-14 DIAGNOSIS — M797 Fibromyalgia: Secondary | ICD-10-CM | POA: Diagnosis not present

## 2014-07-14 DIAGNOSIS — G629 Polyneuropathy, unspecified: Secondary | ICD-10-CM | POA: Diagnosis not present

## 2014-07-21 ENCOUNTER — Ambulatory Visit (HOSPITAL_COMMUNITY): Payer: Self-pay | Admitting: Psychiatry

## 2014-07-24 ENCOUNTER — Encounter (HOSPITAL_COMMUNITY): Payer: Self-pay | Admitting: Psychiatry

## 2014-09-01 DIAGNOSIS — M9903 Segmental and somatic dysfunction of lumbar region: Secondary | ICD-10-CM | POA: Diagnosis not present

## 2014-09-01 DIAGNOSIS — M9901 Segmental and somatic dysfunction of cervical region: Secondary | ICD-10-CM | POA: Diagnosis not present

## 2014-09-01 DIAGNOSIS — M545 Low back pain: Secondary | ICD-10-CM | POA: Diagnosis not present

## 2014-09-01 DIAGNOSIS — M542 Cervicalgia: Secondary | ICD-10-CM | POA: Diagnosis not present

## 2014-09-04 DIAGNOSIS — M545 Low back pain: Secondary | ICD-10-CM | POA: Diagnosis not present

## 2014-09-04 DIAGNOSIS — M9903 Segmental and somatic dysfunction of lumbar region: Secondary | ICD-10-CM | POA: Diagnosis not present

## 2014-09-04 DIAGNOSIS — M542 Cervicalgia: Secondary | ICD-10-CM | POA: Diagnosis not present

## 2014-09-04 DIAGNOSIS — M9901 Segmental and somatic dysfunction of cervical region: Secondary | ICD-10-CM | POA: Diagnosis not present

## 2014-09-07 DIAGNOSIS — K754 Autoimmune hepatitis: Secondary | ICD-10-CM | POA: Diagnosis not present

## 2014-09-08 DIAGNOSIS — K754 Autoimmune hepatitis: Secondary | ICD-10-CM | POA: Diagnosis not present

## 2014-09-09 DIAGNOSIS — K754 Autoimmune hepatitis: Secondary | ICD-10-CM | POA: Diagnosis not present

## 2014-09-10 DIAGNOSIS — K754 Autoimmune hepatitis: Secondary | ICD-10-CM | POA: Diagnosis not present

## 2014-09-11 DIAGNOSIS — K754 Autoimmune hepatitis: Secondary | ICD-10-CM | POA: Diagnosis not present

## 2014-09-14 DIAGNOSIS — Z029 Encounter for administrative examinations, unspecified: Secondary | ICD-10-CM

## 2014-09-14 DIAGNOSIS — K754 Autoimmune hepatitis: Secondary | ICD-10-CM | POA: Diagnosis not present

## 2014-09-15 DIAGNOSIS — K754 Autoimmune hepatitis: Secondary | ICD-10-CM | POA: Diagnosis not present

## 2014-09-16 DIAGNOSIS — K754 Autoimmune hepatitis: Secondary | ICD-10-CM | POA: Diagnosis not present

## 2014-09-17 DIAGNOSIS — K754 Autoimmune hepatitis: Secondary | ICD-10-CM | POA: Diagnosis not present

## 2014-09-18 DIAGNOSIS — K754 Autoimmune hepatitis: Secondary | ICD-10-CM | POA: Diagnosis not present

## 2014-09-21 DIAGNOSIS — K754 Autoimmune hepatitis: Secondary | ICD-10-CM | POA: Diagnosis not present

## 2014-09-22 ENCOUNTER — Ambulatory Visit (HOSPITAL_COMMUNITY)
Admission: RE | Admit: 2014-09-22 | Discharge: 2014-09-22 | Disposition: A | Discharge status: Home / Self Care | Payer: Medicare Other | Source: Ambulatory Visit | Attending: Pulmonary Disease | Admitting: Pulmonary Disease

## 2014-09-22 ENCOUNTER — Other Ambulatory Visit (HOSPITAL_COMMUNITY): Payer: Self-pay | Admitting: Pulmonary Disease

## 2014-09-22 DIAGNOSIS — M79671 Pain in right foot: Secondary | ICD-10-CM

## 2014-09-22 DIAGNOSIS — M19071 Primary osteoarthritis, right ankle and foot: Secondary | ICD-10-CM | POA: Diagnosis not present

## 2014-09-22 DIAGNOSIS — K754 Autoimmune hepatitis: Secondary | ICD-10-CM | POA: Diagnosis not present

## 2014-09-23 ENCOUNTER — Encounter (HOSPITAL_COMMUNITY): Payer: Self-pay | Admitting: Psychiatry

## 2014-09-23 ENCOUNTER — Ambulatory Visit (INDEPENDENT_AMBULATORY_CARE_PROVIDER_SITE_OTHER): Payer: Medicare Other | Admitting: Psychiatry

## 2014-09-23 VITALS — BP 154/107 | HR 95 | Ht 60.0 in | Wt 196.4 lb

## 2014-09-23 DIAGNOSIS — F331 Major depressive disorder, recurrent, moderate: Secondary | ICD-10-CM

## 2014-09-23 DIAGNOSIS — F515 Nightmare disorder: Secondary | ICD-10-CM

## 2014-09-23 DIAGNOSIS — F329 Major depressive disorder, single episode, unspecified: Secondary | ICD-10-CM

## 2014-09-23 DIAGNOSIS — F4310 Post-traumatic stress disorder, unspecified: Secondary | ICD-10-CM

## 2014-09-23 DIAGNOSIS — K754 Autoimmune hepatitis: Secondary | ICD-10-CM | POA: Diagnosis not present

## 2014-09-23 MED ORDER — DULOXETINE HCL 60 MG PO CPEP: 60.0000 mg | ORAL_CAPSULE | Freq: Two times a day (BID) | ORAL | Status: DC

## 2014-09-23 MED ORDER — PRAZOSIN HCL 2 MG PO CAPS: 2.0000 mg | ORAL_CAPSULE | Freq: Every day | ORAL | Status: DC

## 2014-09-23 MED ORDER — AMPHETAMINE-DEXTROAMPHETAMINE 20 MG PO TABS: 20.0000 mg | ORAL_TABLET | Freq: Every day | ORAL | Status: DC

## 2014-09-23 MED ORDER — AMPHETAMINE-DEXTROAMPHETAMINE 20 MG PO TABS: 20.0000 mg | ORAL_TABLET | Freq: Two times a day (BID) | ORAL | Status: DC
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
## 2014-09-24 DIAGNOSIS — K754 Autoimmune hepatitis: Secondary | ICD-10-CM | POA: Diagnosis not present

## 2014-09-25 DIAGNOSIS — K754 Autoimmune hepatitis: Secondary | ICD-10-CM | POA: Diagnosis not present

## 2014-09-28 DIAGNOSIS — K754 Autoimmune hepatitis: Secondary | ICD-10-CM | POA: Diagnosis not present

## 2014-09-29 DIAGNOSIS — K754 Autoimmune hepatitis: Secondary | ICD-10-CM | POA: Diagnosis not present

## 2014-09-30 DIAGNOSIS — K754 Autoimmune hepatitis: Secondary | ICD-10-CM | POA: Diagnosis not present

## 2014-10-01 DIAGNOSIS — K754 Autoimmune hepatitis: Secondary | ICD-10-CM | POA: Diagnosis not present

## 2014-10-02 DIAGNOSIS — K754 Autoimmune hepatitis: Secondary | ICD-10-CM | POA: Diagnosis not present

## 2014-10-05 DIAGNOSIS — K754 Autoimmune hepatitis: Secondary | ICD-10-CM | POA: Diagnosis not present

## 2014-10-06 ENCOUNTER — Encounter (INDEPENDENT_AMBULATORY_CARE_PROVIDER_SITE_OTHER): Payer: Self-pay | Admitting: Internal Medicine

## 2014-10-06 ENCOUNTER — Ambulatory Visit (INDEPENDENT_AMBULATORY_CARE_PROVIDER_SITE_OTHER): Payer: Self-pay | Admitting: Internal Medicine

## 2014-10-06 ENCOUNTER — Ambulatory Visit (INDEPENDENT_AMBULATORY_CARE_PROVIDER_SITE_OTHER): Payer: Medicare Other | Admitting: Internal Medicine

## 2014-10-06 VITALS — BP 104/80 | HR 72 | Temp 98.5°F | Ht 60.0 in | Wt 192.2 lb

## 2014-10-06 DIAGNOSIS — K754 Autoimmune hepatitis: Secondary | ICD-10-CM

## 2014-10-06 NOTE — Progress Notes (Addendum)
Subjective:    Patient ID: Lindsay Walsh, female    DOB: October 04, 1970, 44 y.o.   MRN: [id number]  HPI Here today for f/u. She was last seen in September of 2015. She tells me she is doing okay. She has yet to be diagnosed with diabetes. Hx of autoimmune hepatitis. Diagnosed greater than 10 yrs ago. Apetite is okay. She has lost 4 pounds since her last visit in September. No nausea or vomiing. She usually has a BM once a day.  Sometimes she has several BMs a day. No melena or BRRB. She is disabled.    Lab data from 03/24/2014  WBC 8.3, H&H 12.8 and 37.4 and platelet count 261K  ANC 4700  Paraben 0.5, AP 81, AST 19, ALT 20, total protein 6.6, albumin 3.8 and globulin 2.8. Calcium is 8.8. Serum sodium 140, potassium 4.0, protein 105, CO2 28, BUN 7, creatinine 0.56 and glucose 118  TSH 5.456    Review of Systems Past Medical History  Diagnosis Date  . Gastroesophageal reflux disease     Chronic abdominal pain; gastroparesis; globus hystericus; irritable bowel syndrome  . Hypertension   . Depression   . Fibromyalgia   . Chest pain     Normal stress echo in 2011; PVCs; pedal edema  . Tobacco abuse     one pack per day; 35 pack years  . Overweight(278.02)   . Narcotic dependence   . Pulmonary nodule   . Fasting hyperglycemia   . Shortness of breath     with exertion  . Sleep apnea   . Autoimmune hepatitis   . Irritable bowel syndrome   . Arthritis     back  . Angina     took SL nitro one week ago  . Dysrhythmia     palpatations  . PTSD (post-traumatic stress disorder)   . Sleep apnea     on CPAP machine  . Dyspareunia 05/06/2014    Past Surgical History  Procedure Laterality Date  . Upper gastrointestinal endoscopy  09/13/2010  . Liver biopsy      x4  . Total abdominal hysterectomy w/ bilateral salpingoophorectomy    . Cesarean section      X3  . Tubal ligation      Bilateral  . Abdominal hysterectomy    . Cholecystectomy    . Bladder suspension  09/12/2011      Procedure: TRANSVAGINAL TAPE (TVT) PROCEDURE;  Surgeon: [name], MD;  Location: [facility];  Service: Urology;  Laterality: N/A;  . Lumbar epidural injection  01/2012  . Back surgery    . Esophagogastroduodenoscopy endoscopy      "throat stretched" per pt.    Allergies  Allergen Reactions  . Doxycycline Shortness Of Breath and Rash  . Fentanyl Shortness Of Breath, Itching and Other (See Comments)    Heart racing, SOB  . Ceftin [Cefuroxime Axetil]   . Iohexol Other (See Comments)    Knots on body   . Ivp Dye [Iodinated Diagnostic Agents] Other (See Comments)    Knots on body  . Norvasc [Amlodipine Besylate]   . Wellbutrin [Bupropion Hcl] Other (See Comments)    unknown  . Latex Rash  . Tape Rash    Current Outpatient Prescriptions on File Prior to Visit  Medication Sig Dispense Refill  . amphetamine-dextroamphetamine (ADDERALL) 20 MG tablet Take 1 tablet (20 mg total) by mouth daily. 30 tablet 0  . amphetamine-dextroamphetamine (ADDERALL) 20 MG tablet Take 1 tablet (20 mg total) by  mouth 2 (two) times daily. 60 tablet 0  . amphetamine-dextroamphetamine (ADDERALL) 20 MG tablet Take 1 tablet (20 mg total) by mouth 2 (two) times daily. 60 tablet 0  . Ascorbic Acid (VITAMIN C) 1000 MG tablet Take 1,000 mg by mouth every morning.     Marland Kitchen azaTHIOprine (IMURAN) 50 MG tablet take 1 tablet by mouth once daily 30 tablet 5  . calcium-vitamin D (OS-CAL 500 + D) 500-200 MG-UNIT per tablet Take 1 tablet by mouth 2 (two) times daily.     Marland Kitchen dicyclomine (BENTYL) 10 MG capsule Take 1 capsule (10 mg total) by mouth 3 (three) times daily before meals. For cramps 90 capsule 5  . DULoxetine (CYMBALTA) 60 MG capsule Take 1 capsule (60 mg total) by mouth 2 (two) times daily. 60 capsule 2  . estradiol (CLIMARA - DOSED IN MG/24 HR) 0.075 mg/24hr Place 1 patch onto the skin once a week.      . estradiol (ESTRACE VAGINAL) 0.1 MG/GM vaginal cream Use 1 gm in vagina at hs x 14 days then use 2-3 x weekly  42.5 g 3  . ESZOPICLONE 3 MG tablet Take 3 mg by mouth at bedtime as needed. For sleep    . furosemide (LASIX) 40 MG tablet Take 40 mg by mouth daily.     Marland Kitchen gabapentin (NEURONTIN) 300 MG capsule Take 300 mg by mouth at bedtime as needed.     Marland Kitchen GARLIC PO Take 7,619 mg by mouth daily.     . isosorbide mononitrate (IMDUR) 30 MG 24 hr tablet TAKE ONE (1) TABLET EACH DAY 30 tablet 11  . levETIRAcetam (KEPPRA) 750 MG tablet Take 750 mg by mouth at bedtime.     . lidocaine (LIDODERM) 5 % Place 1 patch onto the skin daily. Remove & Discard patch within 12 hours or as directed by MD 30 patch 0  . lisinopril (PRINIVIL,ZESTRIL) 40 MG tablet Take 40 mg by mouth daily.     . methadone (DOLOPHINE) 5 MG tablet Take 5 mg by mouth 4 (four) times daily as needed. For pain    . metoprolol succinate (TOPROL-XL) 25 MG 24 hr tablet take 3 tablet by mouth every morning 90 tablet 6  . Multiple Vitamins-Minerals (CENTRUM) tablet Take 1 tablet by mouth daily.    Marland Kitchen NEXIUM 40 MG capsule take 1 capsule by mouth every morning 30 capsule 5  . pramipexole (MIRAPEX) 0.25 MG tablet Take 0.25 mg by mouth at bedtime as needed. For restless legs    . prazosin (MINIPRESS) 2 MG capsule Take 1 capsule (2 mg total) by mouth at bedtime. 30 capsule 2  . PROAIR HFA 108 (90 BASE) MCG/ACT inhaler Inhale 2 puffs into the lungs as needed.     . Probiotic Product (ALIGN PO) Take 1 capsule by mouth daily.      No current facility-administered medications on file prior to visit.        Objective:   Physical Exam Blood pressure 104/80, pulse 72, temperature 98.5 F (36.9 C), height 5' (1.524 m), weight 192 lb 3.2 oz (87.181 kg).  Alert and oriented. Skin warm and dry. Oral mucosa is moist.   . Sclera anicteric, conjunctivae is pink. Thyroid not enlarged. No cervical lymphadenopathy. Lungs clear. Heart regular rate and rhythm.  Abdomen is soft. Bowel sounds are positive. No hepatomegaly. No abdominal masses felt. No tenderness.  No edema to  lower extremities  Abrasion to rt knee from a fall which occurred Sunday  Assessment & Plan:  Auto immune hepatitis. She seems to be doing well. Will get labs today.' Hepatic function and CBC. OV in 6 months.

## 2014-10-06 NOTE — Patient Instructions (Signed)
CBC,  Hepatic function.  OV in 6 months

## 2014-10-07 LAB — CBC WITH DIFFERENTIAL/PLATELET
Basophils Absolute: 0.1 10*3/uL (ref 0.0–0.1)
Basophils Relative: 1 % (ref 0–1)
EOS ABS: 0.1 10*3/uL (ref 0.0–0.7)
Eosinophils Relative: 1 % (ref 0–5)
HCT: 40.7 % (ref 36.0–46.0)
Hemoglobin: 13.8 g/dL (ref 12.0–15.0)
LYMPHS PCT: 26 % (ref 12–46)
Lymphs Abs: 2.2 10*3/uL (ref 0.7–4.0)
MCH: 30.8 pg (ref 26.0–34.0)
MCHC: 33.9 g/dL (ref 30.0–36.0)
MCV: 90.8 fL (ref 78.0–100.0)
MPV: 10.4 fL (ref 8.6–12.4)
Monocytes Absolute: 0.6 10*3/uL (ref 0.1–1.0)
Monocytes Relative: 7 % (ref 3–12)
NEUTROS ABS: 5.5 10*3/uL (ref 1.7–7.7)
NEUTROS PCT: 65 % (ref 43–77)
Platelets: 237 10*3/uL (ref 150–400)
RBC: 4.48 MIL/uL (ref 3.87–5.11)
RDW: 13.4 % (ref 11.5–15.5)
WBC: 8.5 10*3/uL (ref 4.0–10.5)

## 2014-10-07 LAB — HEPATIC FUNCTION PANEL
ALK PHOS: 84 U/L (ref 39–117)
ALT: 21 U/L (ref 0–35)
AST: 22 U/L (ref 0–37)
Albumin: 4 g/dL (ref 3.5–5.2)
Bilirubin, Direct: 0.1 mg/dL (ref 0.0–0.3)
Indirect Bilirubin: 0.3 mg/dL (ref 0.2–1.2)
Total Bilirubin: 0.4 mg/dL (ref 0.2–1.2)
Total Protein: 7.1 g/dL (ref 6.0–8.3)

## 2014-10-08 ENCOUNTER — Encounter (HOSPITAL_COMMUNITY): Payer: Self-pay | Admitting: Psychiatry

## 2014-10-09 DIAGNOSIS — K754 Autoimmune hepatitis: Secondary | ICD-10-CM | POA: Diagnosis not present

## 2014-10-12 DIAGNOSIS — M4696 Unspecified inflammatory spondylopathy, lumbar region: Secondary | ICD-10-CM | POA: Diagnosis not present

## 2014-10-12 DIAGNOSIS — F1190 Opioid use, unspecified, uncomplicated: Secondary | ICD-10-CM | POA: Diagnosis not present

## 2014-10-12 DIAGNOSIS — M5416 Radiculopathy, lumbar region: Secondary | ICD-10-CM | POA: Diagnosis not present

## 2014-10-12 DIAGNOSIS — K754 Autoimmune hepatitis: Secondary | ICD-10-CM | POA: Diagnosis not present

## 2014-10-12 DIAGNOSIS — M797 Fibromyalgia: Secondary | ICD-10-CM | POA: Diagnosis not present

## 2014-10-12 DIAGNOSIS — G629 Polyneuropathy, unspecified: Secondary | ICD-10-CM | POA: Diagnosis not present

## 2014-10-13 DIAGNOSIS — H40053 Ocular hypertension, bilateral: Secondary | ICD-10-CM | POA: Diagnosis not present

## 2014-10-13 DIAGNOSIS — K754 Autoimmune hepatitis: Secondary | ICD-10-CM | POA: Diagnosis not present

## 2014-10-13 DIAGNOSIS — H25013 Cortical age-related cataract, bilateral: Secondary | ICD-10-CM | POA: Diagnosis not present

## 2014-10-14 DIAGNOSIS — K754 Autoimmune hepatitis: Secondary | ICD-10-CM | POA: Diagnosis not present

## 2014-10-15 DIAGNOSIS — K754 Autoimmune hepatitis: Secondary | ICD-10-CM | POA: Diagnosis not present

## 2014-10-16 DIAGNOSIS — K754 Autoimmune hepatitis: Secondary | ICD-10-CM | POA: Diagnosis not present

## 2014-10-19 DIAGNOSIS — K754 Autoimmune hepatitis: Secondary | ICD-10-CM | POA: Diagnosis not present

## 2014-10-20 DIAGNOSIS — K754 Autoimmune hepatitis: Secondary | ICD-10-CM | POA: Diagnosis not present

## 2014-10-21 DIAGNOSIS — K754 Autoimmune hepatitis: Secondary | ICD-10-CM | POA: Diagnosis not present

## 2014-10-22 DIAGNOSIS — K754 Autoimmune hepatitis: Secondary | ICD-10-CM | POA: Diagnosis not present

## 2014-10-23 DIAGNOSIS — K754 Autoimmune hepatitis: Secondary | ICD-10-CM | POA: Diagnosis not present

## 2014-10-26 DIAGNOSIS — K754 Autoimmune hepatitis: Secondary | ICD-10-CM | POA: Diagnosis not present

## 2014-10-27 DIAGNOSIS — K754 Autoimmune hepatitis: Secondary | ICD-10-CM | POA: Diagnosis not present

## 2014-10-28 DIAGNOSIS — K754 Autoimmune hepatitis: Secondary | ICD-10-CM | POA: Diagnosis not present

## 2014-10-28 DIAGNOSIS — Z9225 Personal history of immunosupression therapy: Secondary | ICD-10-CM | POA: Diagnosis not present

## 2014-10-29 DIAGNOSIS — K754 Autoimmune hepatitis: Secondary | ICD-10-CM | POA: Diagnosis not present

## 2014-10-30 DIAGNOSIS — K754 Autoimmune hepatitis: Secondary | ICD-10-CM | POA: Diagnosis not present

## 2014-11-02 DIAGNOSIS — K754 Autoimmune hepatitis: Secondary | ICD-10-CM | POA: Diagnosis not present

## 2014-11-03 DIAGNOSIS — K754 Autoimmune hepatitis: Secondary | ICD-10-CM | POA: Diagnosis not present

## 2014-11-04 DIAGNOSIS — K754 Autoimmune hepatitis: Secondary | ICD-10-CM | POA: Diagnosis not present

## 2014-11-09 DIAGNOSIS — H40053 Ocular hypertension, bilateral: Secondary | ICD-10-CM | POA: Diagnosis not present

## 2014-11-12 DIAGNOSIS — M79671 Pain in right foot: Secondary | ICD-10-CM | POA: Diagnosis not present

## 2014-11-12 DIAGNOSIS — G4730 Sleep apnea, unspecified: Secondary | ICD-10-CM | POA: Diagnosis not present

## 2014-11-12 DIAGNOSIS — I10 Essential (primary) hypertension: Secondary | ICD-10-CM | POA: Diagnosis not present

## 2014-11-12 DIAGNOSIS — R079 Chest pain, unspecified: Secondary | ICD-10-CM | POA: Diagnosis not present

## 2014-11-19 DIAGNOSIS — N390 Urinary tract infection, site not specified: Secondary | ICD-10-CM | POA: Diagnosis not present

## 2014-11-19 DIAGNOSIS — M79671 Pain in right foot: Secondary | ICD-10-CM | POA: Diagnosis not present

## 2014-11-19 DIAGNOSIS — R079 Chest pain, unspecified: Secondary | ICD-10-CM | POA: Diagnosis not present

## 2014-11-19 DIAGNOSIS — I10 Essential (primary) hypertension: Secondary | ICD-10-CM | POA: Diagnosis not present

## 2014-11-19 DIAGNOSIS — G4730 Sleep apnea, unspecified: Secondary | ICD-10-CM | POA: Diagnosis not present

## 2014-11-20 ENCOUNTER — Other Ambulatory Visit (HOSPITAL_COMMUNITY): Payer: Self-pay | Admitting: Pulmonary Disease

## 2014-11-20 ENCOUNTER — Ambulatory Visit (HOSPITAL_COMMUNITY)
Admission: RE | Admit: 2014-11-20 | Discharge: 2014-11-20 | Disposition: A | Discharge status: Home / Self Care | Payer: Medicare Other | Source: Ambulatory Visit | Attending: Pulmonary Disease | Admitting: Pulmonary Disease

## 2014-11-20 DIAGNOSIS — R079 Chest pain, unspecified: Secondary | ICD-10-CM

## 2014-11-23 ENCOUNTER — Ambulatory Visit (INDEPENDENT_AMBULATORY_CARE_PROVIDER_SITE_OTHER): Payer: Medicare Other | Admitting: Psychiatry

## 2014-11-23 ENCOUNTER — Encounter (HOSPITAL_COMMUNITY): Payer: Self-pay | Admitting: Psychiatry

## 2014-11-23 VITALS — BP 157/112 | HR 111 | Ht 60.0 in | Wt 194.6 lb

## 2014-11-23 DIAGNOSIS — F331 Major depressive disorder, recurrent, moderate: Secondary | ICD-10-CM

## 2014-11-23 DIAGNOSIS — F4312 Post-traumatic stress disorder, chronic: Secondary | ICD-10-CM

## 2014-11-23 DIAGNOSIS — H40053 Ocular hypertension, bilateral: Secondary | ICD-10-CM | POA: Diagnosis not present

## 2014-11-23 DIAGNOSIS — F329 Major depressive disorder, single episode, unspecified: Secondary | ICD-10-CM | POA: Diagnosis not present

## 2014-11-23 DIAGNOSIS — F515 Nightmare disorder: Secondary | ICD-10-CM

## 2014-11-23 DIAGNOSIS — F4310 Post-traumatic stress disorder, unspecified: Secondary | ICD-10-CM

## 2014-11-23 MED ORDER — AMPHETAMINE-DEXTROAMPHETAMINE 20 MG PO TABS: 20.0000 mg | ORAL_TABLET | Freq: Two times a day (BID) | ORAL | Status: DC

## 2014-11-23 MED ORDER — AMPHETAMINE-DEXTROAMPHETAMINE 20 MG PO TABS: 20.0000 mg | ORAL_TABLET | Freq: Every day | ORAL | Status: DC

## 2014-11-23 MED ORDER — PRAZOSIN HCL 2 MG PO CAPS: 2.0000 mg | ORAL_CAPSULE | Freq: Every day | ORAL | Status: DC

## 2014-11-23 MED ORDER — DULOXETINE HCL 60 MG PO CPEP: 60.0000 mg | ORAL_CAPSULE | Freq: Two times a day (BID) | ORAL | Status: DC
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
## 2014-11-30 DIAGNOSIS — I739 Peripheral vascular disease, unspecified: Secondary | ICD-10-CM | POA: Diagnosis not present

## 2014-11-30 DIAGNOSIS — M7671 Peroneal tendinitis, right leg: Secondary | ICD-10-CM | POA: Diagnosis not present

## 2014-11-30 DIAGNOSIS — M25571 Pain in right ankle and joints of right foot: Secondary | ICD-10-CM | POA: Diagnosis not present

## 2014-11-30 DIAGNOSIS — M79671 Pain in right foot: Secondary | ICD-10-CM | POA: Diagnosis not present

## 2014-11-30 DIAGNOSIS — G629 Polyneuropathy, unspecified: Secondary | ICD-10-CM | POA: Diagnosis not present

## 2014-12-15 DIAGNOSIS — M25671 Stiffness of right ankle, not elsewhere classified: Secondary | ICD-10-CM | POA: Diagnosis not present

## 2014-12-15 DIAGNOSIS — M25471 Effusion, right ankle: Secondary | ICD-10-CM | POA: Diagnosis not present

## 2014-12-15 DIAGNOSIS — M25571 Pain in right ankle and joints of right foot: Secondary | ICD-10-CM | POA: Diagnosis not present

## 2014-12-15 DIAGNOSIS — R262 Difficulty in walking, not elsewhere classified: Secondary | ICD-10-CM | POA: Diagnosis not present

## 2014-12-28 DIAGNOSIS — M25471 Effusion, right ankle: Secondary | ICD-10-CM | POA: Diagnosis not present

## 2014-12-28 DIAGNOSIS — R262 Difficulty in walking, not elsewhere classified: Secondary | ICD-10-CM | POA: Diagnosis not present

## 2014-12-28 DIAGNOSIS — M25671 Stiffness of right ankle, not elsewhere classified: Secondary | ICD-10-CM | POA: Diagnosis not present

## 2014-12-28 DIAGNOSIS — M25571 Pain in right ankle and joints of right foot: Secondary | ICD-10-CM | POA: Diagnosis not present

## 2014-12-30 DIAGNOSIS — M25671 Stiffness of right ankle, not elsewhere classified: Secondary | ICD-10-CM | POA: Diagnosis not present

## 2014-12-30 DIAGNOSIS — R262 Difficulty in walking, not elsewhere classified: Secondary | ICD-10-CM | POA: Diagnosis not present

## 2014-12-30 DIAGNOSIS — M25571 Pain in right ankle and joints of right foot: Secondary | ICD-10-CM | POA: Diagnosis not present

## 2014-12-30 DIAGNOSIS — M25471 Effusion, right ankle: Secondary | ICD-10-CM | POA: Diagnosis not present

## 2015-01-06 DIAGNOSIS — R262 Difficulty in walking, not elsewhere classified: Secondary | ICD-10-CM | POA: Diagnosis not present

## 2015-01-06 DIAGNOSIS — M25571 Pain in right ankle and joints of right foot: Secondary | ICD-10-CM | POA: Diagnosis not present

## 2015-01-06 DIAGNOSIS — M25471 Effusion, right ankle: Secondary | ICD-10-CM | POA: Diagnosis not present

## 2015-01-06 DIAGNOSIS — M25671 Stiffness of right ankle, not elsewhere classified: Secondary | ICD-10-CM | POA: Diagnosis not present

## 2015-01-07 DIAGNOSIS — R262 Difficulty in walking, not elsewhere classified: Secondary | ICD-10-CM | POA: Diagnosis not present

## 2015-01-07 DIAGNOSIS — M25471 Effusion, right ankle: Secondary | ICD-10-CM | POA: Diagnosis not present

## 2015-01-07 DIAGNOSIS — M25571 Pain in right ankle and joints of right foot: Secondary | ICD-10-CM | POA: Diagnosis not present

## 2015-01-07 DIAGNOSIS — M25671 Stiffness of right ankle, not elsewhere classified: Secondary | ICD-10-CM | POA: Diagnosis not present

## 2015-01-08 DIAGNOSIS — M797 Fibromyalgia: Secondary | ICD-10-CM | POA: Diagnosis not present

## 2015-01-08 DIAGNOSIS — F1190 Opioid use, unspecified, uncomplicated: Secondary | ICD-10-CM | POA: Diagnosis not present

## 2015-01-08 DIAGNOSIS — M4696 Unspecified inflammatory spondylopathy, lumbar region: Secondary | ICD-10-CM | POA: Diagnosis not present

## 2015-01-08 DIAGNOSIS — Z79891 Long term (current) use of opiate analgesic: Secondary | ICD-10-CM | POA: Diagnosis not present

## 2015-01-08 DIAGNOSIS — M791 Myalgia: Secondary | ICD-10-CM | POA: Diagnosis not present

## 2015-01-12 DIAGNOSIS — M25671 Stiffness of right ankle, not elsewhere classified: Secondary | ICD-10-CM | POA: Diagnosis not present

## 2015-01-12 DIAGNOSIS — M25571 Pain in right ankle and joints of right foot: Secondary | ICD-10-CM | POA: Diagnosis not present

## 2015-01-12 DIAGNOSIS — M25471 Effusion, right ankle: Secondary | ICD-10-CM | POA: Diagnosis not present

## 2015-01-12 DIAGNOSIS — R262 Difficulty in walking, not elsewhere classified: Secondary | ICD-10-CM | POA: Diagnosis not present

## 2015-01-13 DIAGNOSIS — M25571 Pain in right ankle and joints of right foot: Secondary | ICD-10-CM | POA: Diagnosis not present

## 2015-01-13 DIAGNOSIS — M7671 Peroneal tendinitis, right leg: Secondary | ICD-10-CM | POA: Diagnosis not present

## 2015-01-13 DIAGNOSIS — M79671 Pain in right foot: Secondary | ICD-10-CM | POA: Diagnosis not present

## 2015-01-13 DIAGNOSIS — I739 Peripheral vascular disease, unspecified: Secondary | ICD-10-CM | POA: Diagnosis not present

## 2015-01-15 ENCOUNTER — Other Ambulatory Visit (HOSPITAL_COMMUNITY): Payer: Self-pay | Admitting: Podiatry

## 2015-01-15 DIAGNOSIS — I739 Peripheral vascular disease, unspecified: Secondary | ICD-10-CM

## 2015-01-21 DIAGNOSIS — R262 Difficulty in walking, not elsewhere classified: Secondary | ICD-10-CM | POA: Diagnosis not present

## 2015-01-21 DIAGNOSIS — M25571 Pain in right ankle and joints of right foot: Secondary | ICD-10-CM | POA: Diagnosis not present

## 2015-01-21 DIAGNOSIS — M25671 Stiffness of right ankle, not elsewhere classified: Secondary | ICD-10-CM | POA: Diagnosis not present

## 2015-01-21 DIAGNOSIS — M25471 Effusion, right ankle: Secondary | ICD-10-CM | POA: Diagnosis not present

## 2015-01-22 ENCOUNTER — Ambulatory Visit (HOSPITAL_COMMUNITY)
Admission: RE | Admit: 2015-01-22 | Discharge: 2015-01-22 | Disposition: A | Discharge status: Home / Self Care | Payer: Medicare Other | Source: Ambulatory Visit | Attending: Podiatry | Admitting: Podiatry

## 2015-01-22 DIAGNOSIS — G629 Polyneuropathy, unspecified: Secondary | ICD-10-CM | POA: Diagnosis not present

## 2015-01-22 DIAGNOSIS — I739 Peripheral vascular disease, unspecified: Secondary | ICD-10-CM

## 2015-01-22 DIAGNOSIS — G2581 Restless legs syndrome: Secondary | ICD-10-CM | POA: Diagnosis not present

## 2015-01-22 DIAGNOSIS — F17210 Nicotine dependence, cigarettes, uncomplicated: Secondary | ICD-10-CM | POA: Diagnosis not present

## 2015-01-22 DIAGNOSIS — I10 Essential (primary) hypertension: Secondary | ICD-10-CM | POA: Diagnosis not present

## 2015-01-22 DIAGNOSIS — E785 Hyperlipidemia, unspecified: Secondary | ICD-10-CM | POA: Diagnosis not present

## 2015-01-22 DIAGNOSIS — G540 Brachial plexus disorders: Secondary | ICD-10-CM | POA: Diagnosis not present

## 2015-01-25 DIAGNOSIS — M25471 Effusion, right ankle: Secondary | ICD-10-CM | POA: Diagnosis not present

## 2015-01-25 DIAGNOSIS — M25571 Pain in right ankle and joints of right foot: Secondary | ICD-10-CM | POA: Diagnosis not present

## 2015-01-25 DIAGNOSIS — R262 Difficulty in walking, not elsewhere classified: Secondary | ICD-10-CM | POA: Diagnosis not present

## 2015-01-25 DIAGNOSIS — M25671 Stiffness of right ankle, not elsewhere classified: Secondary | ICD-10-CM | POA: Diagnosis not present

## 2015-01-27 DIAGNOSIS — I739 Peripheral vascular disease, unspecified: Secondary | ICD-10-CM | POA: Diagnosis not present

## 2015-01-28 DIAGNOSIS — R262 Difficulty in walking, not elsewhere classified: Secondary | ICD-10-CM | POA: Diagnosis not present

## 2015-01-28 DIAGNOSIS — M25671 Stiffness of right ankle, not elsewhere classified: Secondary | ICD-10-CM | POA: Diagnosis not present

## 2015-01-28 DIAGNOSIS — M25571 Pain in right ankle and joints of right foot: Secondary | ICD-10-CM | POA: Diagnosis not present

## 2015-01-28 DIAGNOSIS — M25471 Effusion, right ankle: Secondary | ICD-10-CM | POA: Diagnosis not present

## 2015-02-03 DIAGNOSIS — M25671 Stiffness of right ankle, not elsewhere classified: Secondary | ICD-10-CM | POA: Diagnosis not present

## 2015-02-03 DIAGNOSIS — Z0000 Encounter for general adult medical examination without abnormal findings: Secondary | ICD-10-CM | POA: Diagnosis not present

## 2015-02-03 DIAGNOSIS — M25571 Pain in right ankle and joints of right foot: Secondary | ICD-10-CM | POA: Diagnosis not present

## 2015-02-03 DIAGNOSIS — R262 Difficulty in walking, not elsewhere classified: Secondary | ICD-10-CM | POA: Diagnosis not present

## 2015-02-03 DIAGNOSIS — M25471 Effusion, right ankle: Secondary | ICD-10-CM | POA: Diagnosis not present

## 2015-02-04 DIAGNOSIS — M25571 Pain in right ankle and joints of right foot: Secondary | ICD-10-CM | POA: Diagnosis not present

## 2015-02-04 DIAGNOSIS — R262 Difficulty in walking, not elsewhere classified: Secondary | ICD-10-CM | POA: Diagnosis not present

## 2015-02-04 DIAGNOSIS — M25671 Stiffness of right ankle, not elsewhere classified: Secondary | ICD-10-CM | POA: Diagnosis not present

## 2015-02-04 DIAGNOSIS — M25471 Effusion, right ankle: Secondary | ICD-10-CM | POA: Diagnosis not present

## 2015-02-08 DIAGNOSIS — M25471 Effusion, right ankle: Secondary | ICD-10-CM | POA: Diagnosis not present

## 2015-02-08 DIAGNOSIS — R262 Difficulty in walking, not elsewhere classified: Secondary | ICD-10-CM | POA: Diagnosis not present

## 2015-02-08 DIAGNOSIS — M25571 Pain in right ankle and joints of right foot: Secondary | ICD-10-CM | POA: Diagnosis not present

## 2015-02-08 DIAGNOSIS — M25671 Stiffness of right ankle, not elsewhere classified: Secondary | ICD-10-CM | POA: Diagnosis not present

## 2015-02-11 DIAGNOSIS — R262 Difficulty in walking, not elsewhere classified: Secondary | ICD-10-CM | POA: Diagnosis not present

## 2015-02-11 DIAGNOSIS — M25671 Stiffness of right ankle, not elsewhere classified: Secondary | ICD-10-CM | POA: Diagnosis not present

## 2015-02-11 DIAGNOSIS — M25471 Effusion, right ankle: Secondary | ICD-10-CM | POA: Diagnosis not present

## 2015-02-11 DIAGNOSIS — M25571 Pain in right ankle and joints of right foot: Secondary | ICD-10-CM | POA: Diagnosis not present

## 2015-02-16 DIAGNOSIS — M25671 Stiffness of right ankle, not elsewhere classified: Secondary | ICD-10-CM | POA: Diagnosis not present

## 2015-02-16 DIAGNOSIS — M25571 Pain in right ankle and joints of right foot: Secondary | ICD-10-CM | POA: Diagnosis not present

## 2015-02-16 DIAGNOSIS — M25471 Effusion, right ankle: Secondary | ICD-10-CM | POA: Diagnosis not present

## 2015-02-16 DIAGNOSIS — R262 Difficulty in walking, not elsewhere classified: Secondary | ICD-10-CM | POA: Diagnosis not present

## 2015-02-17 DIAGNOSIS — M25671 Stiffness of right ankle, not elsewhere classified: Secondary | ICD-10-CM | POA: Diagnosis not present

## 2015-02-17 DIAGNOSIS — M25471 Effusion, right ankle: Secondary | ICD-10-CM | POA: Diagnosis not present

## 2015-02-17 DIAGNOSIS — M25571 Pain in right ankle and joints of right foot: Secondary | ICD-10-CM | POA: Diagnosis not present

## 2015-02-17 DIAGNOSIS — R262 Difficulty in walking, not elsewhere classified: Secondary | ICD-10-CM | POA: Diagnosis not present

## 2015-02-23 ENCOUNTER — Encounter (HOSPITAL_COMMUNITY): Payer: Self-pay | Admitting: Psychiatry

## 2015-02-23 ENCOUNTER — Ambulatory Visit (HOSPITAL_COMMUNITY): Payer: Self-pay | Admitting: Psychiatry

## 2015-03-02 ENCOUNTER — Other Ambulatory Visit (HOSPITAL_COMMUNITY): Payer: Self-pay | Admitting: Pulmonary Disease

## 2015-03-02 ENCOUNTER — Ambulatory Visit (HOSPITAL_COMMUNITY)
Admission: RE | Admit: 2015-03-02 | Discharge: 2015-03-02 | Disposition: A | Discharge status: Home / Self Care | Payer: Medicare Other | Source: Ambulatory Visit | Attending: Pulmonary Disease | Admitting: Pulmonary Disease

## 2015-03-02 DIAGNOSIS — I10 Essential (primary) hypertension: Secondary | ICD-10-CM | POA: Insufficient documentation

## 2015-03-02 DIAGNOSIS — M5136 Other intervertebral disc degeneration, lumbar region: Secondary | ICD-10-CM | POA: Diagnosis not present

## 2015-03-02 DIAGNOSIS — Z720 Tobacco use: Secondary | ICD-10-CM | POA: Insufficient documentation

## 2015-03-02 DIAGNOSIS — Z8249 Family history of ischemic heart disease and other diseases of the circulatory system: Secondary | ICD-10-CM | POA: Diagnosis not present

## 2015-03-02 DIAGNOSIS — G4730 Sleep apnea, unspecified: Secondary | ICD-10-CM

## 2015-03-02 DIAGNOSIS — F4310 Post-traumatic stress disorder, unspecified: Secondary | ICD-10-CM | POA: Diagnosis not present

## 2015-03-02 DIAGNOSIS — R200 Anesthesia of skin: Secondary | ICD-10-CM

## 2015-03-02 DIAGNOSIS — E669 Obesity, unspecified: Secondary | ICD-10-CM | POA: Diagnosis not present

## 2015-03-02 DIAGNOSIS — E119 Type 2 diabetes mellitus without complications: Secondary | ICD-10-CM | POA: Diagnosis not present

## 2015-03-02 DIAGNOSIS — K754 Autoimmune hepatitis: Secondary | ICD-10-CM | POA: Diagnosis not present

## 2015-03-02 DIAGNOSIS — R531 Weakness: Secondary | ICD-10-CM | POA: Diagnosis not present

## 2015-03-02 DIAGNOSIS — Z833 Family history of diabetes mellitus: Secondary | ICD-10-CM | POA: Diagnosis not present

## 2015-03-02 DIAGNOSIS — K219 Gastro-esophageal reflux disease without esophagitis: Secondary | ICD-10-CM | POA: Diagnosis not present

## 2015-03-02 DIAGNOSIS — Z6837 Body mass index (BMI) 37.0-37.9, adult: Secondary | ICD-10-CM | POA: Diagnosis not present

## 2015-03-02 DIAGNOSIS — F17210 Nicotine dependence, cigarettes, uncomplicated: Secondary | ICD-10-CM | POA: Diagnosis not present

## 2015-03-02 DIAGNOSIS — M797 Fibromyalgia: Secondary | ICD-10-CM | POA: Diagnosis not present

## 2015-03-02 DIAGNOSIS — I639 Cerebral infarction, unspecified: Secondary | ICD-10-CM | POA: Diagnosis not present

## 2015-03-05 ENCOUNTER — Inpatient Hospital Stay (HOSPITAL_COMMUNITY)
Admission: EM | Admit: 2015-03-05 | Discharge: 2015-03-08 | DRG: 066 | Disposition: A | Discharge status: Home / Self Care | Payer: Medicare Other | Attending: Pulmonary Disease | Admitting: Pulmonary Disease

## 2015-03-05 ENCOUNTER — Emergency Department (HOSPITAL_COMMUNITY): Payer: Medicare Other

## 2015-03-05 ENCOUNTER — Inpatient Hospital Stay (HOSPITAL_COMMUNITY): Payer: Medicare Other

## 2015-03-05 ENCOUNTER — Encounter (HOSPITAL_COMMUNITY): Payer: Self-pay | Admitting: *Deleted

## 2015-03-05 DIAGNOSIS — F17210 Nicotine dependence, cigarettes, uncomplicated: Secondary | ICD-10-CM | POA: Diagnosis present

## 2015-03-05 DIAGNOSIS — K219 Gastro-esophageal reflux disease without esophagitis: Secondary | ICD-10-CM | POA: Diagnosis present

## 2015-03-05 DIAGNOSIS — Z8249 Family history of ischemic heart disease and other diseases of the circulatory system: Secondary | ICD-10-CM | POA: Diagnosis not present

## 2015-03-05 DIAGNOSIS — E669 Obesity, unspecified: Secondary | ICD-10-CM | POA: Diagnosis present

## 2015-03-05 DIAGNOSIS — I638 Other cerebral infarction: Secondary | ICD-10-CM | POA: Diagnosis not present

## 2015-03-05 DIAGNOSIS — K754 Autoimmune hepatitis: Secondary | ICD-10-CM | POA: Diagnosis present

## 2015-03-05 DIAGNOSIS — F17200 Nicotine dependence, unspecified, uncomplicated: Secondary | ICD-10-CM | POA: Diagnosis present

## 2015-03-05 DIAGNOSIS — I6523 Occlusion and stenosis of bilateral carotid arteries: Secondary | ICD-10-CM | POA: Diagnosis not present

## 2015-03-05 DIAGNOSIS — I10 Essential (primary) hypertension: Secondary | ICD-10-CM

## 2015-03-05 DIAGNOSIS — F4310 Post-traumatic stress disorder, unspecified: Secondary | ICD-10-CM | POA: Diagnosis present

## 2015-03-05 DIAGNOSIS — M792 Neuralgia and neuritis, unspecified: Secondary | ICD-10-CM

## 2015-03-05 DIAGNOSIS — M5136 Other intervertebral disc degeneration, lumbar region: Secondary | ICD-10-CM | POA: Diagnosis present

## 2015-03-05 DIAGNOSIS — K589 Irritable bowel syndrome without diarrhea: Secondary | ICD-10-CM

## 2015-03-05 DIAGNOSIS — I6789 Other cerebrovascular disease: Secondary | ICD-10-CM | POA: Diagnosis not present

## 2015-03-05 DIAGNOSIS — G4730 Sleep apnea, unspecified: Secondary | ICD-10-CM | POA: Diagnosis present

## 2015-03-05 DIAGNOSIS — Z720 Tobacco use: Secondary | ICD-10-CM

## 2015-03-05 DIAGNOSIS — M797 Fibromyalgia: Secondary | ICD-10-CM | POA: Diagnosis present

## 2015-03-05 DIAGNOSIS — I639 Cerebral infarction, unspecified: Principal | ICD-10-CM

## 2015-03-05 DIAGNOSIS — I6330 Cerebral infarction due to thrombosis of unspecified cerebral artery: Secondary | ICD-10-CM | POA: Diagnosis not present

## 2015-03-05 DIAGNOSIS — Z833 Family history of diabetes mellitus: Secondary | ICD-10-CM | POA: Diagnosis not present

## 2015-03-05 DIAGNOSIS — R531 Weakness: Secondary | ICD-10-CM | POA: Diagnosis present

## 2015-03-05 DIAGNOSIS — Z6837 Body mass index (BMI) 37.0-37.9, adult: Secondary | ICD-10-CM | POA: Diagnosis not present

## 2015-03-05 DIAGNOSIS — IMO0001 Reserved for inherently not codable concepts without codable children: Secondary | ICD-10-CM | POA: Diagnosis present

## 2015-03-05 LAB — COMPREHENSIVE METABOLIC PANEL
ALBUMIN: 3.6 g/dL (ref 3.5–5.0)
ALK PHOS: 81 U/L (ref 38–126)
ALT: 17 U/L (ref 14–54)
AST: 21 U/L (ref 15–41)
Anion gap: 6 (ref 5–15)
BILIRUBIN TOTAL: 0.4 mg/dL (ref 0.3–1.2)
BUN: 8 mg/dL (ref 6–20)
CALCIUM: 8.5 mg/dL — AB (ref 8.9–10.3)
CO2: 28 mmol/L (ref 22–32)
Chloride: 105 mmol/L (ref 101–111)
Creatinine, Ser: 0.6 mg/dL (ref 0.44–1.00)
GFR calc Af Amer: >60 mL/min (ref >60)
GFR calc non Af Amer: >60 mL/min (ref >60)
GLUCOSE: 106 mg/dL — AB (ref 65–99)
POTASSIUM: 3.7 mmol/L (ref 3.5–5.1)
SODIUM: 139 mmol/L (ref 135–145)
TOTAL PROTEIN: 6.8 g/dL (ref 6.5–8.1)

## 2015-03-05 LAB — CBC WITH DIFFERENTIAL/PLATELET
BASOS ABS: 0 10*3/uL (ref 0.0–0.1)
Basophils Relative: 0 % (ref 0–1)
EOS PCT: 1 % (ref 0–5)
Eosinophils Absolute: 0.1 10*3/uL (ref 0.0–0.7)
HEMATOCRIT: 35.4 % — AB (ref 36.0–46.0)
Hemoglobin: 12.3 g/dL (ref 12.0–15.0)
LYMPHS PCT: 28 % (ref 12–46)
Lymphs Abs: 2.3 10*3/uL (ref 0.7–4.0)
MCH: 31.1 pg (ref 26.0–34.0)
MCHC: 34.7 g/dL (ref 30.0–36.0)
MCV: 89.4 fL (ref 78.0–100.0)
MONO ABS: 0.7 10*3/uL (ref 0.1–1.0)
MONOS PCT: 8 % (ref 3–12)
NEUTROS ABS: 5.2 10*3/uL (ref 1.7–7.7)
Neutrophils Relative %: 63 % (ref 43–77)
PLATELETS: 230 10*3/uL (ref 150–400)
RBC: 3.96 MIL/uL (ref 3.87–5.11)
RDW: 12.2 % (ref 11.5–15.5)
WBC: 8.2 10*3/uL (ref 4.0–10.5)

## 2015-03-05 MED ORDER — ALBUTEROL SULFATE HFA 108 (90 BASE) MCG/ACT IN AERS
1.0000 | INHALATION_SPRAY | Freq: Four times a day (QID) | RESPIRATORY_TRACT | Status: DC | PRN
  Filled 2015-03-05: qty 6.7

## 2015-03-05 MED ORDER — LIDOCAINE 5 % EX PTCH
1.0000 | MEDICATED_PATCH | CUTANEOUS | Status: DC
  Administered 2015-03-07: 1 via TRANSDERMAL
  Filled 2015-03-05 (×5): qty 1

## 2015-03-05 MED ORDER — PRAZOSIN HCL 1 MG PO CAPS
2.0000 mg | ORAL_CAPSULE | Freq: Every day | ORAL | Status: DC
  Administered 2015-03-06 – 2015-03-07 (×3): 2 mg via ORAL
  Filled 2015-03-05: qty 1
  Filled 2015-03-05: qty 2
  Filled 2015-03-05: qty 1
  Filled 2015-03-05: qty 2
  Filled 2015-03-05: qty 1

## 2015-03-05 MED ORDER — AMPHETAMINE-DEXTROAMPHETAMINE 10 MG PO TABS
20.0000 mg | ORAL_TABLET | Freq: Two times a day (BID) | ORAL | Status: DC
  Filled 2015-03-05: qty 2

## 2015-03-05 MED ORDER — METOPROLOL SUCCINATE ER 50 MG PO TB24
75.0000 mg | ORAL_TABLET | Freq: Every day | ORAL | Status: DC
  Administered 2015-03-05 – 2015-03-08 (×4): 75 mg via ORAL
  Filled 2015-03-05 (×8): qty 1

## 2015-03-05 MED ORDER — STROKE: EARLY STAGES OF RECOVERY BOOK
Freq: Once | Status: AC
  Administered 2015-03-06: 17:00:00
  Filled 2015-03-05: qty 1

## 2015-03-05 MED ORDER — AMPHETAMINE-DEXTROAMPHETAMINE 10 MG PO TABS: 20.0000 mg | ORAL_TABLET | Freq: Two times a day (BID) | ORAL | Status: DC

## 2015-03-05 MED ORDER — PANTOPRAZOLE SODIUM 40 MG PO TBEC
40.0000 mg | DELAYED_RELEASE_TABLET | Freq: Every day | ORAL | Status: DC
  Administered 2015-03-05 – 2015-03-08 (×4): 40 mg via ORAL
  Filled 2015-03-05 (×4): qty 1

## 2015-03-05 MED ORDER — ESTRADIOL 0.075 MG/24HR TD PTWK: 0.0750 mg | MEDICATED_PATCH | TRANSDERMAL | Status: DC

## 2015-03-05 MED ORDER — METOPROLOL SUCCINATE ER 25 MG PO TB24
25.0000 mg | ORAL_TABLET | Freq: Every day | ORAL | Status: DC
  Filled 2015-03-05 (×4): qty 1

## 2015-03-05 MED ORDER — ISOSORBIDE MONONITRATE ER 30 MG PO TB24
30.0000 mg | ORAL_TABLET | Freq: Every day | ORAL | Status: DC
  Administered 2015-03-06 – 2015-03-08 (×3): 30 mg via ORAL
  Filled 2015-03-05 (×3): qty 1

## 2015-03-05 MED ORDER — SODIUM CHLORIDE 0.9 % IV BOLUS (SEPSIS)
500.0000 mL | Freq: Once | INTRAVENOUS | Status: AC
Start: 2015-03-05 — End: 2015-03-05
  Administered 2015-03-05: 500 mL via INTRAVENOUS

## 2015-03-05 MED ORDER — LISINOPRIL 10 MG PO TABS: 40.0000 mg | ORAL_TABLET | Freq: Every day | ORAL | Status: DC

## 2015-03-05 MED ORDER — ASPIRIN 300 MG RE SUPP
300.0000 mg | Freq: Every day | RECTAL | Status: DC
  Filled 2015-03-05 (×5): qty 1

## 2015-03-05 MED ORDER — ALBUTEROL SULFATE (2.5 MG/3ML) 0.083% IN NEBU: 3.0000 mL | INHALATION_SOLUTION | Freq: Four times a day (QID) | RESPIRATORY_TRACT | Status: DC | PRN

## 2015-03-05 MED ORDER — SODIUM CHLORIDE 0.9 % IV SOLN
INTRAVENOUS | Status: DC
  Administered 2015-03-07: 22:00:00 via INTRAVENOUS

## 2015-03-05 MED ORDER — ENOXAPARIN SODIUM 40 MG/0.4ML ~~LOC~~ SOLN
40.0000 mg | SUBCUTANEOUS | Status: DC
  Administered 2015-03-06 – 2015-03-07 (×2): 40 mg via SUBCUTANEOUS
  Filled 2015-03-05 (×2): qty 0.4

## 2015-03-05 MED ORDER — LISINOPRIL 10 MG PO TABS
40.0000 mg | ORAL_TABLET | Freq: Every day | ORAL | Status: DC
  Administered 2015-03-06 – 2015-03-08 (×3): 40 mg via ORAL
  Filled 2015-03-05 (×3): qty 4

## 2015-03-05 MED ORDER — DULOXETINE HCL 60 MG PO CPEP
60.0000 mg | ORAL_CAPSULE | Freq: Two times a day (BID) | ORAL | Status: DC
  Administered 2015-03-05 – 2015-03-08 (×6): 60 mg via ORAL
  Filled 2015-03-05 (×6): qty 1

## 2015-03-05 MED ORDER — CLONIDINE HCL 0.2 MG PO TABS
0.2000 mg | ORAL_TABLET | Freq: Two times a day (BID) | ORAL | Status: DC
  Administered 2015-03-05 – 2015-03-08 (×6): 0.2 mg via ORAL
  Filled 2015-03-05 (×6): qty 1

## 2015-03-05 MED ORDER — SODIUM CHLORIDE 0.9 % IV SOLN
INTRAVENOUS | Status: DC
  Administered 2015-03-05: 17:00:00 via INTRAVENOUS

## 2015-03-05 MED ORDER — CLONIDINE HCL 0.1 MG PO TABS: 0.1000 mg | ORAL_TABLET | Freq: Two times a day (BID) | ORAL | Status: DC

## 2015-03-05 MED ORDER — AZATHIOPRINE 50 MG PO TABS
50.0000 mg | ORAL_TABLET | Freq: Every day | ORAL | Status: DC
  Administered 2015-03-06 – 2015-03-08 (×3): 50 mg via ORAL
  Filled 2015-03-05 (×6): qty 1

## 2015-03-05 MED ORDER — ASPIRIN 325 MG PO TABS
325.0000 mg | ORAL_TABLET | Freq: Every day | ORAL | Status: DC
  Administered 2015-03-06 – 2015-03-08 (×3): 325 mg via ORAL
  Filled 2015-03-05 (×3): qty 1

## 2015-03-05 MED ORDER — PRAZOSIN HCL 1 MG PO CAPS
ORAL_CAPSULE | ORAL | Status: AC
  Filled 2015-03-05: qty 2

## 2015-03-05 MED ORDER — GABAPENTIN 300 MG PO CAPS
300.0000 mg | ORAL_CAPSULE | Freq: Every evening | ORAL | Status: DC | PRN
  Filled 2015-03-05: qty 1

## 2015-03-05 MED ORDER — FUROSEMIDE 40 MG PO TABS
40.0000 mg | ORAL_TABLET | ORAL | Status: DC
  Administered 2015-03-06 – 2015-03-07 (×2): 40 mg via ORAL
  Filled 2015-03-05 (×2): qty 1

## 2015-03-05 MED ORDER — DICYCLOMINE HCL 10 MG PO CAPS
10.0000 mg | ORAL_CAPSULE | Freq: Three times a day (TID) | ORAL | Status: DC
  Administered 2015-03-06 – 2015-03-08 (×4): 10 mg via ORAL
  Filled 2015-03-05 (×6): qty 1

## 2015-03-05 MED ORDER — CIPROFLOXACIN-DEXAMETHASONE 0.3-0.1 % OT SUSP
2.0000 [drp] | Freq: Every day | OTIC | Status: DC
  Administered 2015-03-06 – 2015-03-08 (×3): 2 [drp] via OTIC
  Filled 2015-03-05 (×2): qty 7.5

## 2015-03-05 MED ORDER — CALCIUM CARBONATE-VITAMIN D 500-200 MG-UNIT PO TABS
1.0000 | ORAL_TABLET | Freq: Two times a day (BID) | ORAL | Status: DC
  Administered 2015-03-05 – 2015-03-08 (×6): 1 via ORAL
  Filled 2015-03-05 (×6): qty 1

## 2015-03-05 NOTE — ED Notes (Signed)
Returned from MRI 

## 2015-03-05 NOTE — ED Notes (Signed)
Pt states she has been hypertensive x 3 days, stating 170/103. Pt states numbness to to left fingertips, left arm, and mouth x 4 days. Pt had head CT 2 days ago ordered by PCP. NAD at this time. Pt states PCP added clonidine to meds but states she is feeling no better. States generalized weakness.

## 2015-03-05 NOTE — ED Provider Notes (Signed)
CSN: [id number]     Arrival date & time 03/05/15  1342 History   First MD Initiated Contact with Patient 03/05/15 1358     Chief Complaint  Patient presents with  . Hypertension     (Consider location/radiation/quality/duration/timing/severity/associated sxs/prior Treatment) Patient is a 44 y.o. female presenting with hypertension. The history is provided by the patient.  Hypertension Pertinent negatives include no chest pain, no abdominal pain, no headaches and no shortness of breath.   patient followed by Dr. Luan Pulling. Patient with known history of hypertension. Here lately blood pressure is been elevated Dr. Luan Pulling added on clonidine to her lisinopril and Toprol regiment. This was done on Tuesday also ordered a head CT at that time. Patient with four-day history of left-sided facial numbness and left arm numbness. In the more recently the left facial numbness has intensified and become constant. Patient also states that she has a generalized weakness and feels like when she walks that she is off balance. Patient has been concerned about her blood pressure being elevated does not seem to be responding to the medications. Patient denies headache denies any visual changes with the eyes no fever no nausea no vomiting.  Past Medical History  Diagnosis Date  . Gastroesophageal reflux disease     Chronic abdominal pain; gastroparesis; globus hystericus; irritable bowel syndrome  . Hypertension   . Depression   . Fibromyalgia   . Chest pain     Normal stress echo in 2011; PVCs; pedal edema  . Tobacco abuse     one pack per day; 35 pack years  . Overweight(278.02)   . Narcotic dependence   . Pulmonary nodule   . Fasting hyperglycemia   . Shortness of breath     with exertion  . Sleep apnea   . Autoimmune hepatitis   . Irritable bowel syndrome   . Arthritis     back  . Angina     took SL nitro one week ago  . Dysrhythmia     palpatations  . PTSD (post-traumatic stress disorder)   .  Sleep apnea     on CPAP machine  . Dyspareunia 05/06/2014   Past Surgical History  Procedure Laterality Date  . Upper gastrointestinal endoscopy  09/13/2010  . Liver biopsy      x4  . Total abdominal hysterectomy w/ bilateral salpingoophorectomy    . Cesarean section      X3  . Tubal ligation      Bilateral  . Abdominal hysterectomy    . Cholecystectomy    . Bladder suspension  09/12/2011    Procedure: TRANSVAGINAL TAPE (TVT) PROCEDURE;  Surgeon: [name], MD;  Location: [facility];  Service: Urology;  Laterality: N/A;  . Lumbar epidural injection  01/2012  . Back surgery    . Esophagogastroduodenoscopy endoscopy      "throat stretched" per pt.   Family History  Problem Relation Age of Onset  . Diabetes Mother   . Hypertension Mother   . Anxiety disorder Mother   . Depression Mother   . Drug abuse Mother   . Heart disease Father   . Diabetes Father   . Anxiety disorder Father   . Alcohol abuse Father   . Depression Father   . OCD Father   . Thyroid disease Sister   . Healthy Brother   . Healthy Daughter   . Healthy Son   . ADD / ADHD Son   . Anesthesia problems Neg Hx   . Malignant  hyperthermia Neg Hx   . Pseudochol deficiency Neg Hx   . Hypotension Neg Hx   . Bipolar disorder Neg Hx   . Dementia Neg Hx   . Paranoid behavior Neg Hx   . Schizophrenia Neg Hx   . Sexual abuse Neg Hx   . Physical abuse Neg Hx   . Alcohol abuse Paternal Grandfather   . Depression Paternal Grandfather   . Cancer Paternal Grandfather     lung,skin  . Tuberculosis Paternal Grandfather   . Seizures Paternal Grandmother   . Lupus Paternal Grandmother   . ADD / ADHD Son    Social History  Substance Use Topics  . Smoking status: Current Every Day Smoker -- 1.50 packs/day for 25 years    Types: Cigarettes  . Smokeless tobacco: Never Used     Comment: 15-20 cigs a day as of 11/12/2012  . Alcohol Use: No   OB History    Gravida Para Term Preterm AB TAB SAB Ectopic Multiple  Living   3 3        3      Review of Systems  Constitutional: Negative for fever.  HENT: Negative for congestion.   Eyes: Negative for visual disturbance.  Respiratory: Negative for shortness of breath.   Cardiovascular: Negative for chest pain.  Gastrointestinal: Negative for nausea, vomiting and abdominal pain.  Genitourinary: Negative for dysuria.  Musculoskeletal: Negative for back pain and neck pain.  Skin: Negative for rash.  Neurological: Positive for weakness and numbness. Negative for dizziness, syncope, speech difficulty and headaches.  Hematological: Does not bruise/bleed easily.  Psychiatric/Behavioral: Negative for confusion.      Allergies  Doxycycline; Fentanyl; Ceftin; Iohexol; Ivp dye; Norvasc; Wellbutrin; Latex; and Tape  Home Medications   Prior to Admission medications   Medication Sig Start Date End Date Taking? Authorizing Provider  amphetamine-dextroamphetamine (ADDERALL) 20 MG tablet Take 1 tablet (20 mg total) by mouth 2 (two) times daily. 11/23/14 11/23/15  Cloria Spring, MD  amphetamine-dextroamphetamine (ADDERALL) 20 MG tablet Take 1 tablet (20 mg total) by mouth 2 (two) times daily. 11/23/14 11/23/15  Cloria Spring, MD  amphetamine-dextroamphetamine (ADDERALL) 20 MG tablet Take 1 tablet (20 mg total) by mouth daily. 11/23/14 11/23/15  Cloria Spring, MD  Ascorbic Acid (VITAMIN C) 1000 MG tablet Take 1,000 mg by mouth every morning.     Historical Provider, MD  azaTHIOprine (IMURAN) 50 MG tablet take 1 tablet by mouth once daily 05/19/14   Rogene Houston, MD  calcium-vitamin D (OS-CAL 500 + D) 500-200 MG-UNIT per tablet Take 1 tablet by mouth 2 (two) times daily.     Historical Provider, MD  dicyclomine (BENTYL) 10 MG capsule Take 1 capsule (10 mg total) by mouth 3 (three) times daily before meals. For cramps 09/30/12   Rogene Houston, MD  DULoxetine (CYMBALTA) 60 MG capsule Take 1 capsule (60 mg total) by mouth 2 (two) times daily. 11/23/14 11/23/15  Cloria Spring, MD  estradiol (CLIMARA - DOSED IN MG/24 HR) 0.075 mg/24hr Place 1 patch onto the skin once a week.      Historical Provider, MD  estradiol (ESTRACE VAGINAL) 0.1 MG/GM vaginal cream Use 1 gm in vagina at hs x 14 days then use 2-3 x weekly 05/06/14   Estill Dooms, NP  ESZOPICLONE 3 MG tablet Take 3 mg by mouth at bedtime as needed. For sleep 04/18/12   Sinda Du, MD  furosemide (LASIX) 40 MG tablet Take 40 mg by  mouth daily.     Historical Provider, MD  gabapentin (NEURONTIN) 300 MG capsule Take 300 mg by mouth at bedtime as needed.     Historical Provider, MD  GARLIC PO Take 6,440 mg by mouth daily.     Historical Provider, MD  isosorbide mononitrate (IMDUR) 30 MG 24 hr tablet TAKE ONE (1) TABLET EACH DAY 03/09/14   Lendon Colonel, NP  levETIRAcetam (KEPPRA) 750 MG tablet Take 750 mg by mouth at bedtime.     Historical Provider, MD  lidocaine (LIDODERM) 5 % Place 1 patch onto the skin daily. Remove & Discard patch within 12 hours or as directed by MD 09/29/13   Rogene Houston, MD  lisinopril (PRINIVIL,ZESTRIL) 40 MG tablet Take 40 mg by mouth daily.  09/23/12   Historical Provider, MD  methadone (DOLOPHINE) 5 MG tablet Take 5 mg by mouth 4 (four) times daily as needed. For pain 06/23/12   Historical Provider, MD  metoprolol succinate (TOPROL-XL) 25 MG 24 hr tablet take 3 tablet by mouth every morning 05/18/14   Lendon Colonel, NP  Multiple Vitamins-Minerals (CENTRUM) tablet Take 1 tablet by mouth daily.    Historical Provider, MD  NEXIUM 40 MG capsule take 1 capsule by mouth every morning    Rogene Houston, MD  pramipexole (MIRAPEX) 0.25 MG tablet Take 0.25 mg by mouth at bedtime as needed. For restless legs    Historical Provider, MD  prazosin (MINIPRESS) 2 MG capsule Take 1 capsule (2 mg total) by mouth at bedtime. 11/23/14   Cloria Spring, MD  PROAIR HFA 108 (90 BASE) MCG/ACT inhaler Inhale 2 puffs into the lungs as needed.  12/16/12   Historical Provider, MD  Probiotic  Product (ALIGN PO) Take 1 capsule by mouth daily.     Historical Provider, MD   BP 155/94 mmHg  Pulse 76  Temp(Src) 97.8 F (36.6 C) (Oral)  Resp 18  Ht 5' (1.524 m)  Wt 191 lb (86.637 kg)  BMI 37.30 kg/m2  SpO2 99% Physical Exam  Constitutional: She appears well-developed and well-nourished. No distress.  HENT:  Head: Normocephalic.  Mouth/Throat: Oropharynx is clear and moist.  Eyes: Conjunctivae and EOM are normal. Pupils are equal, round, and reactive to light.  Neck: Normal range of motion. Neck supple.  Cardiovascular: Normal rate, regular rhythm and normal heart sounds.   No murmur heard. Pulmonary/Chest: Effort normal. No respiratory distress.  Abdominal: Soft. Bowel sounds are normal. There is no tenderness.  Musculoskeletal: Normal range of motion. She exhibits no edema.  Neurological: A cranial nerve deficit is present. She exhibits normal muscle tone. Coordination abnormal.  Patient with numbness to the left side of the face and numbness to the left arm. Patient's coordination is slightly off.  Skin: Skin is warm. No rash noted.  Nursing note and vitals reviewed.   ED Course  Procedures (including critical care time) Labs Review Labs Reviewed  COMPREHENSIVE METABOLIC PANEL - Abnormal; Notable for the following:    Glucose, Bld 106 (*)    Calcium 8.5 (*)    All other components within normal limits  CBC WITH DIFFERENTIAL/PLATELET - Abnormal; Notable for the following:    HCT 35.4 (*)    All other components within normal limits   Results for orders placed or performed during the hospital encounter of 03/05/15  Comprehensive metabolic panel  Result Value Ref Range   Sodium 139 135 - 145 mmol/L   Potassium 3.7 3.5 - 5.1 mmol/L   Chloride  105 101 - 111 mmol/L   CO2 28 22 - 32 mmol/L   Glucose, Bld 106 (H) 65 - 99 mg/dL   BUN 8 6 - 20 mg/dL   Creatinine, Ser 0.60 0.44 - 1.00 mg/dL   Calcium 8.5 (L) 8.9 - 10.3 mg/dL   Total Protein 6.8 6.5 - 8.1 g/dL    Albumin 3.6 3.5 - 5.0 g/dL   AST 21 15 - 41 U/L   ALT 17 14 - 54 U/L   Alkaline Phosphatase 81 38 - 126 U/L   Total Bilirubin 0.4 0.3 - 1.2 mg/dL   GFR calc non Af Amer >60 >60 mL/min   GFR calc Af Amer >60 >60 mL/min   Anion gap 6 5 - 15  CBC with Differential/Platelet  Result Value Ref Range   WBC 8.2 4.0 - 10.5 K/uL   RBC 3.96 3.87 - 5.11 MIL/uL   Hemoglobin 12.3 12.0 - 15.0 g/dL   HCT 35.4 (L) 36.0 - 46.0 %   MCV 89.4 78.0 - 100.0 fL   MCH 31.1 26.0 - 34.0 pg   MCHC 34.7 30.0 - 36.0 g/dL   RDW 12.2 11.5 - 15.5 %   Platelets 230 150 - 400 K/uL   Neutrophils Relative % 63 43 - 77 %   Neutro Abs 5.2 1.7 - 7.7 K/uL   Lymphocytes Relative 28 12 - 46 %   Lymphs Abs 2.3 0.7 - 4.0 K/uL   Monocytes Relative 8 3 - 12 %   Monocytes Absolute 0.7 0.1 - 1.0 K/uL   Eosinophils Relative 1 0 - 5 %   Eosinophils Absolute 0.1 0.0 - 0.7 K/uL   Basophils Relative 0 0 - 1 %   Basophils Absolute 0.0 0.0 - 0.1 K/uL    Imaging Review Mr Brain Wo Contrast  03/05/2015   CLINICAL DATA:  44 year old female with left side weakness and numbness. Initial encounter.  EXAM: MRI HEAD WITHOUT CONTRAST  TECHNIQUE: Multiplanar, multiecho pulse sequences of the brain and surrounding structures were obtained without intravenous contrast.  COMPARISON:  Head CT without contrast 03/02/2015, brain MRI 04/25/2011, and earlier  FINDINGS: 5 mm round focus of restricted diffusion in the right superior midbrain, just lateral to the red nucleus an just inferior to the right thalamus. Associated T2 and FLAIR hyperintensity without hemorrhage or mass effect.  No other restricted diffusion. Major intracranial vascular flow voids are within normal limits. Fetal type right PCA origin suspected.  Elsewhere Stable and normal gray and white matter signal. No cortical encephalomalacia or chronic cerebral blood products.  No midline shift, mass effect, evidence of mass lesion, ventriculomegaly, extra-axial collection or acute intracranial  hemorrhage. Cervicomedullary junction and pituitary are within normal limits. Negative visualized cervical spine. Visualized bone marrow signal is within normal limits. Mastoids are clear. Trace paranasal sinus mucosal thickening. Negative orbit and scalp soft tissues.  IMPRESSION: 1. Small acute lacunar infarct in the right midbrain. No hemorrhage or mass effect. 2. Elsewhere stable and negative noncontrast MRI appearance of the brain.   Electronically Signed   By: Genevie Ann M.D.   On: 03/05/2015 15:24   I have personally reviewed and evaluated these images and lab results as part of my medical decision-making.   EKG Interpretation None      MDM   Final diagnoses:  Cerebral infarction due to unspecified mechanism  Essential hypertension    The patient's MRI consistent with a midbrain right CVA which could very well explain her left-sided symptoms of facial numbness  and numbness in the left arm. Patient describes generalized motor weakness but no acute or weakness. Patient had CT of her head 2 days ago which was negative. Ordered by her primary care doctor. Patient's blood pressure has been high for the past several days. Patient did have clonidine added for this. However patient did not take the clonidine or her Toprol today. Only took her lisinopril. Patient's blood pressure without any additional medication treatment is 155/94. Had ordered her Toprol will hold due to stroke. Patient blood pressure when she first came in the highest was 176/107. Patient without any shortness of breath no chest pain no severe headache.  Due to the delay in presentation patient not a candidate for TPA on the stroke protocol. Patient will be admitted by the hospitalist service.  Fredia Sorrow, MD 03/05/15 1626

## 2015-03-05 NOTE — ED Notes (Signed)
In MRI at this time

## 2015-03-05 NOTE — H&P (Signed)
[facility] History and Physical  Lindsay Walsh IDP:[id number] DOB: 09-18-1970 DOA: 03/05/2015  Referring physician: ER PCP: Alonza Bogus, MD   Chief Complaint: left-sided tingling/numbness.  HPI: Lindsay Walsh is a 43 y.o. female  This is a 44 year old lady who has known history of hypertension and who noticed that her blood pressure was significantly elevated 3-4 days ago. She saw her primary care physician, Dr. Luan Pulling for her hypertension. Clonidine was added to her medications for hypertension. She also at this time was describing left-sided facial numbness and left arm numbness. Dr. Luan Pulling ordered a CT brain scan 2 days ago. This did not show any evidence of acute CVA. She presents again because of concern of elevated blood pressure and continued symptoms. She does not describe significant weakness in the left side but sensory symptoms rather motor symptoms. She denies any headache, visual changes or speech difficulties. There is no nausea or vomiting. She has not had any syncopal episodes. There is no chest pain or palpitations.she continues to smoke cigarettes. There is a history of narcotic dependence also. Evaluation in the emergency room with MRI brain scan shows acute small lacunar infarct in the right midbrain. She is now being admitted for further investigation and management.   Review of Systems:  Apart from symptoms above, all systems negative.  Past Medical History  Diagnosis Date  . Gastroesophageal reflux disease     Chronic abdominal pain; gastroparesis; globus hystericus; irritable bowel syndrome  . Hypertension   . Depression   . Fibromyalgia   . Chest pain     Normal stress echo in 2011; PVCs; pedal edema  . Tobacco abuse     one pack per day; 35 pack years  . Overweight(278.02)   . Narcotic dependence   . Pulmonary nodule   . Fasting hyperglycemia   . Shortness of breath     with exertion  . Sleep apnea   . Autoimmune hepatitis   .  Irritable bowel syndrome   . Arthritis     back  . Angina     took SL nitro one week ago  . Dysrhythmia     palpatations  . PTSD (post-traumatic stress disorder)   . Sleep apnea     on CPAP machine  . Dyspareunia 05/06/2014   Past Surgical History  Procedure Laterality Date  . Upper gastrointestinal endoscopy  09/13/2010  . Liver biopsy      x4  . Total abdominal hysterectomy w/ bilateral salpingoophorectomy    . Cesarean section      X3  . Tubal ligation      Bilateral  . Abdominal hysterectomy    . Cholecystectomy    . Bladder suspension  09/12/2011    Procedure: TRANSVAGINAL TAPE (TVT) PROCEDURE;  Surgeon: [name], MD;  Location: [facility];  Service: Urology;  Laterality: N/A;  . Lumbar epidural injection  01/2012  . Back surgery    . Esophagogastroduodenoscopy endoscopy      "throat stretched" per pt.   Social History:  reports that she has been smoking Cigarettes.  She has a 37.5 pack-year smoking history. She has never used smokeless tobacco. She reports that she does not drink alcohol or use illicit drugs.  Allergies  Allergen Reactions  . Doxycycline Shortness Of Breath and Rash  . Fentanyl Shortness Of Breath, Itching and Other (See Comments)    Heart racing, SOB  . Ceftin [Cefuroxime Axetil]   . Iohexol Other (See Comments)    Knots on  body   . Ivp Dye [Iodinated Diagnostic Agents] Other (See Comments)    Knots on body  . Norvasc [Amlodipine Besylate]   . Wellbutrin [Bupropion Hcl] Other (See Comments)    unknown  . Latex Rash  . Tape Rash    Family History  Problem Relation Age of Onset  . Diabetes Mother   . Hypertension Mother   . Anxiety disorder Mother   . Depression Mother   . Drug abuse Mother   . Heart disease Father   . Diabetes Father   . Anxiety disorder Father   . Alcohol abuse Father   . Depression Father   . OCD Father   . Thyroid disease Sister   . Healthy Brother   . Healthy Daughter   . Healthy Son   . ADD / ADHD Son    . Anesthesia problems Neg Hx   . Malignant hyperthermia Neg Hx   . Pseudochol deficiency Neg Hx   . Hypotension Neg Hx   . Bipolar disorder Neg Hx   . Dementia Neg Hx   . Paranoid behavior Neg Hx   . Schizophrenia Neg Hx   . Sexual abuse Neg Hx   . Physical abuse Neg Hx   . Alcohol abuse Paternal Grandfather   . Depression Paternal Grandfather   . Cancer Paternal Grandfather     lung,skin  . Tuberculosis Paternal Grandfather   . Seizures Paternal Grandmother   . Lupus Paternal Grandmother   . ADD / ADHD Son     Prior to Admission medications   Medication Sig Start Date End Date Taking? Authorizing Provider  amphetamine-dextroamphetamine (ADDERALL) 20 MG tablet Take 1 tablet (20 mg total) by mouth 2 (two) times daily. 11/23/14 11/23/15 Yes Cloria Spring, MD  Ascorbic Acid (VITAMIN C) 1000 MG tablet Take 1,000 mg by mouth every morning.    Yes Historical Provider, MD  azaTHIOprine (IMURAN) 50 MG tablet take 1 tablet by mouth once daily 05/19/14  Yes Rogene Houston, MD  calcium-vitamin D (OS-CAL 500 + D) 500-200 MG-UNIT per tablet Take 1 tablet by mouth 2 (two) times daily.    Yes Historical Provider, MD  CIPRODEX otic suspension Place 2 drops into the left ear daily.  02/19/15  Yes Historical Provider, MD  cloNIDine (CATAPRES) 0.1 MG tablet Take 0.1 mg by mouth 2 (two) times daily.  03/02/15  Yes Historical Provider, MD  dicyclomine (BENTYL) 10 MG capsule Take 1 capsule (10 mg total) by mouth 3 (three) times daily before meals. For cramps Patient taking differently: Take 10 mg by mouth daily as needed. For cramps 09/30/12  Yes Rogene Houston, MD  DULoxetine (CYMBALTA) 60 MG capsule Take 1 capsule (60 mg total) by mouth 2 (two) times daily. 11/23/14 11/23/15 Yes Cloria Spring, MD  estradiol (CLIMARA - DOSED IN MG/24 HR) 0.075 mg/24hr Place 1 patch onto the skin once a week.     Yes Historical Provider, MD  furosemide (LASIX) 40 MG tablet Take 40 mg by mouth every other day.    Yes  Historical Provider, MD  gabapentin (NEURONTIN) 300 MG capsule Take 300 mg by mouth at bedtime as needed (for pain).    Yes Historical Provider, MD  GARLIC PO Take 5,631 mg by mouth daily.    Yes Historical Provider, MD  isosorbide mononitrate (IMDUR) 30 MG 24 hr tablet TAKE ONE (1) TABLET EACH DAY 03/09/14  Yes Lendon Colonel, NP  lidocaine (LIDODERM) 5 % Place 1 patch onto the  skin daily. Remove & Discard patch within 12 hours or as directed by MD Patient taking differently: Place 1 patch onto the skin daily as needed (for pain).  09/29/13  Yes Rogene Houston, MD  lisinopril (PRINIVIL,ZESTRIL) 40 MG tablet Take 40 mg by mouth daily.  09/23/12  Yes Historical Provider, MD  methadone (DOLOPHINE) 5 MG tablet Take 5 mg by mouth 4 (four) times daily as needed. For pain 06/23/12  Yes Historical Provider, MD  metoprolol succinate (TOPROL-XL) 25 MG 24 hr tablet take 3 tablet by mouth every morning 05/18/14  Yes Lendon Colonel, NP  Multiple Vitamins-Minerals (CENTRUM) tablet Take 1 tablet by mouth daily.   Yes Historical Provider, MD  NEXIUM 40 MG capsule take 1 capsule by mouth every morning   Yes Rogene Houston, MD  pramipexole (MIRAPEX) 0.25 MG tablet Take 0.25 mg by mouth at bedtime as needed. For restless legs   Yes Historical Provider, MD  prazosin (MINIPRESS) 2 MG capsule Take 1 capsule (2 mg total) by mouth at bedtime. 11/23/14  Yes Cloria Spring, MD  PROAIR HFA 108 [phone] BASE) MCG/ACT inhaler Inhale 1-2 puffs into the lungs every 6 (six) hours as needed for wheezing or shortness of breath.  12/16/12  Yes Historical Provider, MD  Probiotic Product (ALIGN PO) Take 1 capsule by mouth daily.    Yes Historical Provider, MD  estradiol (ESTRACE VAGINAL) 0.1 MG/GM vaginal cream Use 1 gm in vagina at hs x 14 days then use 2-3 x weekly Patient not taking: Reported on 03/05/2015 05/06/14   Estill Dooms, NP  levofloxacin (LEVAQUIN) 500 MG tablet 7 day course starting on 8/16 02/23/15   Historical  Provider, MD   Physical Exam: Filed Vitals:   03/05/15 1551 03/05/15 1600 03/05/15 1700 03/05/15 1730  BP: 155/94 193/113 181/97 166/93  Pulse: 76 81 82 83  Temp:      TempSrc:      Resp:  20 23 14   Height:      Weight:      SpO2:  99% 100% 98%    Wt Readings from Last 3 Encounters:  03/05/15 86.637 kg (191 lb)  03/05/15 86.637 kg (191 lb)  11/23/14 88.27 kg (194 lb 9.6 oz)    General:  Appears somewhat anxious. Blood pressure is elevated. She is alert.obese. Eyes: PERRL, normal lids, irises & conjunctiva ENT: grossly normal hearing, lips & tongue Neck: no LAD, masses or thyromegaly Cardiovascular: RRR, no m/r/g. No LE edema. Telemetry: SR, no arrhythmias  Respiratory: CTA bilaterally, no w/r/r. Normal respiratory effort. Abdomen: soft, ntnd Skin: no rash or induration seen on limited exam Musculoskeletal: grossly normal tone BUE/BLE Psychiatric: anxious. Neurologic: grossly non-focal.no obvious focal neurological signs.          Labs on Admission:  Basic Metabolic Panel:  Recent Labs Lab 03/05/15 1432  NA 139  K 3.7  CL 105  CO2 28  GLUCOSE 106*  BUN 8  CREATININE 0.60  CALCIUM 8.5*   Liver Function Tests:  Recent Labs Lab 03/05/15 1432  AST 21  ALT 17  ALKPHOS 81  BILITOT 0.4  PROT 6.8  ALBUMIN 3.6   No results for input(s): LIPASE, AMYLASE in the last 168 hours. No results for input(s): AMMONIA in the last 168 hours. CBC:  Recent Labs Lab 03/05/15 1432  WBC 8.2  NEUTROABS 5.2  HGB 12.3  HCT 35.4*  MCV 89.4  PLT 230   Cardiac Enzymes: No results for input(s): CKTOTAL, CKMB, CKMBINDEX,  TROPONINI in the last 168 hours.  BNP (last 3 results) No results for input(s): BNP in the last 8760 hours.  ProBNP (last 3 results) No results for input(s): PROBNP in the last 8760 hours.  CBG: No results for input(s): GLUCAP in the last 168 hours.  Radiological Exams on Admission: Mr Herby Abraham Contrast  03/05/2015   CLINICAL DATA:  44 year old  female with left side weakness and numbness. Initial encounter.  EXAM: MRI HEAD WITHOUT CONTRAST  TECHNIQUE: Multiplanar, multiecho pulse sequences of the brain and surrounding structures were obtained without intravenous contrast.  COMPARISON:  Head CT without contrast 03/02/2015, brain MRI 04/25/2011, and earlier  FINDINGS: 5 mm round focus of restricted diffusion in the right superior midbrain, just lateral to the red nucleus an just inferior to the right thalamus. Associated T2 and FLAIR hyperintensity without hemorrhage or mass effect.  No other restricted diffusion. Major intracranial vascular flow voids are within normal limits. Fetal type right PCA origin suspected.  Elsewhere Stable and normal gray and white matter signal. No cortical encephalomalacia or chronic cerebral blood products.  No midline shift, mass effect, evidence of mass lesion, ventriculomegaly, extra-axial collection or acute intracranial hemorrhage. Cervicomedullary junction and pituitary are within normal limits. Negative visualized cervical spine. Visualized bone marrow signal is within normal limits. Mastoids are clear. Trace paranasal sinus mucosal thickening. Negative orbit and scalp soft tissues.  IMPRESSION: 1. Small acute lacunar infarct in the right midbrain. No hemorrhage or mass effect. 2. Elsewhere stable and negative noncontrast MRI appearance of the brain.   Electronically Signed   By: Genevie Ann M.D.   On: 03/05/2015 15:24    EKG: Independently reviewed. Sinus rhythm without any acute ST-T wave changes.  Assessment/Plan   1. Acute right midbrain CVA. Start aspirin. Stroke workup. Neurology consultation. 2. Uncontrolled hypertension. Increase clonidine dose. Allow permissible hypertension. 3. Tobacco abuse. I strongly counseled against further use of nicotine/smoking.  Further recommendations will depend on patient's hospital progress.   Code Status: full code.   DVT Prophylaxis:Lovenox.  Family Communication: I  discussed the plan with the patient at the bedside.   Disposition Plan: home when medically stable.  Time spent: 60 minutes.  Doree Albee [facility] Pager [phone].

## 2015-03-05 NOTE — ED Notes (Signed)
Hospitalist at bedside at this time for evaluation.

## 2015-03-05 NOTE — ED Notes (Signed)
MD at bedside. 

## 2015-03-06 ENCOUNTER — Inpatient Hospital Stay (HOSPITAL_COMMUNITY): Payer: Medicare Other

## 2015-03-06 LAB — LIPID PANEL
CHOL/HDL RATIO: 4.9 ratio
Cholesterol: 151 mg/dL (ref 0–200)
HDL: 31 mg/dL — AB (ref >40)
LDL CALC: 104 mg/dL — AB (ref 0–99)
TRIGLYCERIDES: 79 mg/dL (ref <150)
VLDL: 16 mg/dL (ref 0–40)

## 2015-03-06 MED ORDER — NICOTINE 21 MG/24HR TD PT24
21.0000 mg | MEDICATED_PATCH | Freq: Every day | TRANSDERMAL | Status: DC
  Administered 2015-03-06 – 2015-03-08 (×3): 21 mg via TRANSDERMAL
  Filled 2015-03-06 (×4): qty 1

## 2015-03-06 MED ORDER — ATORVASTATIN CALCIUM 40 MG PO TABS
80.0000 mg | ORAL_TABLET | Freq: Every day | ORAL | Status: DC
  Administered 2015-03-06 – 2015-03-07 (×2): 80 mg via ORAL
  Filled 2015-03-06 (×2): qty 2

## 2015-03-06 MED ORDER — METHADONE HCL 10 MG PO TABS
5.0000 mg | ORAL_TABLET | ORAL | Status: DC | PRN
  Administered 2015-03-06 – 2015-03-07 (×3): 5 mg via ORAL
  Filled 2015-03-06 (×4): qty 1

## 2015-03-06 NOTE — Progress Notes (Signed)
Notified Dr. Legrand Rams of the patients request for her home medication.  New orders given and followed.

## 2015-03-06 NOTE — Progress Notes (Signed)
  Echocardiogram 2D Echocardiogram has been performed.  Lindsay Walsh 03/06/2015, 1:53 PM

## 2015-03-06 NOTE — Progress Notes (Signed)
Subjective: This is a patient of the following office for many years with multiple medical problems. She had been in the office earlier this week with hypertension that was extreme at home but in the 140-150/90 range in my office. Because she was complaining of some left hand numbness as well as the blood pressure problems she had CT of the brain that did not show a stroke. However at home she continued to have trouble with her blood pressure and the hand numbness and eventually came to the emergency department. In the emergency department her blood pressure was indeed very elevated and MRI showed that she had lacunar infarction I suspect just did not show on her CT. She says she feels better. She still has some numbness of her fingertips and perhaps some mild left lower extremity weakness. In discussion with her family her father is being worked up for TIAs now.  Objective: Vital signs in last 24 hours: Temp:  [97.8 F (36.6 C)-98.9 F (37.2 C)] 98.1 F (36.7 C) (08/27 0830) Pulse Rate:  [61-97] 70 (08/27 0830) Resp:  [14-23] 18 (08/27 0830) BP: (125-193)/(75-113) 125/82 mmHg (08/27 0830) SpO2:  [97 %-100 %] 100 % (08/27 0830) Weight:  [86.637 kg (191 lb)-87.771 kg (193 lb 8 oz)] 87.544 kg (193 lb) (08/26 2041) Weight change:     Intake/Output from previous day: 08/26 0701 - 08/27 0700 In: -  Out: 1100 [Urine:1100]  PHYSICAL EXAM General appearance: alert, cooperative, mild distress and moderately obese Resp: clear to auscultation bilaterally Cardio: regular rate and rhythm, S1, S2 normal, no murmur, click, rub or gallop GI: soft, non-tender; bowel sounds normal; no masses,  no organomegaly Extremities: extremities normal, atraumatic, no cyanosis or edema  Lab Results:  Results for orders placed or performed during the hospital encounter of 03/05/15 (from the past 48 hour(s))  Comprehensive metabolic panel     Status: Abnormal   Collection Time: 03/05/15  2:32 PM  Result Value Ref  Range   Sodium 139 135 - 145 mmol/[facility]   Potassium 3.7 3.5 - 5.1 mmol/[facility]   Chloride 105 101 - 111 mmol/[facility]   CO2 28 22 - 32 mmol/[facility]   Glucose, Bld 106 (H) 65 - 99 mg/dL   BUN 8 6 - 20 mg/dL   Creatinine, Ser 0.60 0.44 - 1.00 mg/dL   Calcium 8.5 ([facility]) 8.9 - 10.3 mg/dL   Total Protein 6.8 6.5 - 8.1 g/dL   Albumin 3.6 3.5 - 5.0 g/dL   AST 21 15 - 41 U/[facility]   ALT 17 14 - 54 U/[facility]   Alkaline Phosphatase 81 38 - 126 U/[facility]   Total Bilirubin 0.4 0.3 - 1.2 mg/dL   GFR calc non Af Amer >60 >60 mL/min   GFR calc Af Amer >60 >60 mL/min    Comment: (NOTE) The eGFR has been calculated using the CKD EPI equation. This calculation has not been validated in all clinical situations. eGFR's persistently <60 mL/min signify possible Chronic Kidney Disease.    Anion gap 6 5 - 15  CBC with Differential/Platelet     Status: Abnormal   Collection Time: 03/05/15  2:32 PM  Result Value Ref Range   WBC 8.2 4.0 - 10.5 K/uL   RBC 3.96 3.87 - 5.11 MIL/uL   Hemoglobin 12.3 12.0 - 15.0 g/dL   HCT 35.4 ([facility]) 36.0 - 46.0 %   MCV 89.4 78.0 - 100.0 fL   MCH 31.1 26.0 - 34.0 pg   MCHC 34.7 30.0 - 36.0 g/dL  RDW 12.2 11.5 - 15.5 %   Platelets 230 150 - 400 K/uL   Neutrophils Relative % 63 43 - 77 %   Neutro Abs 5.2 1.7 - 7.7 K/uL   Lymphocytes Relative 28 12 - 46 %   Lymphs Abs 2.3 0.7 - 4.0 K/uL   Monocytes Relative 8 3 - 12 %   Monocytes Absolute 0.7 0.1 - 1.0 K/uL   Eosinophils Relative 1 0 - 5 %   Eosinophils Absolute 0.1 0.0 - 0.7 K/uL   Basophils Relative 0 0 - 1 %   Basophils Absolute 0.0 0.0 - 0.1 K/uL  Lipid panel     Status: Abnormal   Collection Time: 03/06/15  5:22 AM  Result Value Ref Range   Cholesterol 151 0 - 200 mg/dL   Triglycerides 79 <150 mg/dL   HDL 31 ([place]) >40 mg/dL   Total CHOL/HDL Ratio 4.9 RATIO   VLDL 16 0 - 40 mg/dL   LDL Cholesterol 104 (H) 0 - 99 mg/dL    Comment:        Total Cholesterol/HDL:CHD Risk Coronary Heart Disease Risk Table                     Men   Women  1/2 Average Risk    3.4   3.3  Average Risk       5.0   4.4  2 X Average Risk   9.6   7.1  3 X Average Risk  23.4   11.0        Use the calculated Patient Ratio above and the CHD Risk Table to determine the patient's CHD Risk.        ATP III CLASSIFICATION (LDL):  <100     mg/dL   Optimal  100-129  mg/dL   Near or Above                    Optimal  130-159  mg/dL   Borderline  160-189  mg/dL   High  >190     mg/dL   Very High     ABGS No results for input(s): PHART, PO2ART, TCO2, HCO3 in the last 72 hours.  Invalid input(s): PCO2 CULTURES No results found for this or any previous visit (from the past 240 hour(s)). Studies/Results: Mr Lindsay Walsh IN Contrast  03/05/2015   CLINICAL DATA:  Stroke  EXAM: MRA HEAD WITHOUT CONTRAST  TECHNIQUE: Angiographic images of the Circle of Willis were obtained using MRA technique without intravenous contrast.  COMPARISON:  MRI head 03/05/2015  FINDINGS: Both vertebral arteries are patent to the basilar. AICA patent bilaterally. PICA not visualized. Basilar widely patent. Superior cerebellar and posterior cerebral arteries patent bilaterally. Fetal origin right posterior cerebral artery with hypoplastic right P1 segment.  Internal carotid artery widely patent bilaterally. Anterior and middle cerebral arteries widely patent bilaterally  Negative for cerebral aneurysm.  IMPRESSION: Negative   Electronically Signed   By: Franchot Gallo M.D.   On: 03/05/2015 21:11   Mr Brain Wo Contrast  03/05/2015   CLINICAL DATA:  44 year old female with left side weakness and numbness. Initial encounter.  EXAM: MRI HEAD WITHOUT CONTRAST  TECHNIQUE: Multiplanar, multiecho pulse sequences of the brain and surrounding structures were obtained without intravenous contrast.  COMPARISON:  Head CT without contrast 03/02/2015, brain MRI 04/25/2011, and earlier  FINDINGS: 5 mm round focus of restricted diffusion in the right superior midbrain, just lateral to the red nucleus an just inferior to  the  right thalamus. Associated T2 and FLAIR hyperintensity without hemorrhage or mass effect.  No other restricted diffusion. Major intracranial vascular flow voids are within normal limits. Fetal type right PCA origin suspected.  Elsewhere Stable and normal gray and white matter signal. No cortical encephalomalacia or chronic cerebral blood products.  No midline shift, mass effect, evidence of mass lesion, ventriculomegaly, extra-axial collection or acute intracranial hemorrhage. Cervicomedullary junction and pituitary are within normal limits. Negative visualized cervical spine. Visualized bone marrow signal is within normal limits. Mastoids are clear. Trace paranasal sinus mucosal thickening. Negative orbit and scalp soft tissues.  IMPRESSION: 1. Small acute lacunar infarct in the right midbrain. No hemorrhage or mass effect. 2. Elsewhere stable and negative noncontrast MRI appearance of the brain.   Electronically Signed   By: Genevie Ann M.D.   On: 03/05/2015 15:24    Medications:  Prior to Admission:  Prescriptions prior to admission  Medication Sig Dispense Refill Last Dose  . amphetamine-dextroamphetamine (ADDERALL) 20 MG tablet Take 1 tablet (20 mg total) by mouth 2 (two) times daily. 60 tablet 0 03/04/2015 at Unknown time  . Ascorbic Acid (VITAMIN C) 1000 MG tablet Take 1,000 mg by mouth every morning.    03/04/2015 at Unknown time  . azaTHIOprine (IMURAN) 50 MG tablet take 1 tablet by mouth once daily 30 tablet 5 03/04/2015 at Unknown time  . calcium-vitamin D (OS-CAL 500 + D) 500-200 MG-UNIT per tablet Take 1 tablet by mouth 2 (two) times daily.    03/04/2015 at Unknown time  . CIPRODEX otic suspension Place 2 drops into the left ear daily.    03/05/2015 at Unknown time  . cloNIDine (CATAPRES) 0.1 MG tablet Take 0.1 mg by mouth 2 (two) times daily.    03/04/2015 at Unknown time  . dicyclomine (BENTYL) 10 MG capsule Take 1 capsule (10 mg total) by mouth 3 (three) times daily before meals. For cramps  (Patient taking differently: Take 10 mg by mouth daily as needed. For cramps) 90 capsule 5 Past Week at Unknown time  . DULoxetine (CYMBALTA) 60 MG capsule Take 1 capsule (60 mg total) by mouth 2 (two) times daily. 60 capsule 2 03/04/2015 at Unknown time  . estradiol (CLIMARA - DOSED IN MG/24 HR) 0.075 mg/24hr Place 1 patch onto the skin once a week.     Past Week at Unknown time  . furosemide (LASIX) 40 MG tablet Take 40 mg by mouth every other day.    03/04/2015 at Unknown time  . gabapentin (NEURONTIN) 300 MG capsule Take 300 mg by mouth at bedtime as needed (for pain).    Past Week at Unknown time  . GARLIC PO Take 3,568 mg by mouth daily.    03/04/2015 at Unknown time  . isosorbide mononitrate (IMDUR) 30 MG 24 hr tablet TAKE ONE (1) TABLET EACH DAY 30 tablet 11 03/04/2015 at Unknown time  . lidocaine (LIDODERM) 5 % Place 1 patch onto the skin daily. Remove & Discard patch within 12 hours or as directed by MD (Patient taking differently: Place 1 patch onto the skin daily as needed (for pain). ) 30 patch 0 Past Month at Unknown time  . lisinopril (PRINIVIL,ZESTRIL) 40 MG tablet Take 40 mg by mouth daily.    03/05/2015 at Unknown time  . methadone (DOLOPHINE) 5 MG tablet Take 5 mg by mouth 4 (four) times daily as needed. For pain   03/04/2015 at Unknown time  . metoprolol succinate (TOPROL-XL) 25 MG 24 hr tablet take  3 tablet by mouth every morning 90 tablet 6 03/04/2015 at 1200n  . Multiple Vitamins-Minerals (CENTRUM) tablet Take 1 tablet by mouth daily.   03/04/2015 at Unknown time  . NEXIUM 40 MG capsule take 1 capsule by mouth every morning 30 capsule 5 03/04/2015 at Unknown time  . pramipexole (MIRAPEX) 0.25 MG tablet Take 0.25 mg by mouth at bedtime as needed. For restless legs   03/04/2015 at Unknown time  . prazosin (MINIPRESS) 2 MG capsule Take 1 capsule (2 mg total) by mouth at bedtime. 30 capsule 2 03/04/2015 at Unknown time  . PROAIR HFA 108 (90 BASE) MCG/ACT inhaler Inhale 1-2 puffs into the lungs  every 6 (six) hours as needed for wheezing or shortness of breath.    unknown  . Probiotic Product (ALIGN PO) Take 1 capsule by mouth daily.    03/04/2015 at Unknown time  . estradiol (ESTRACE VAGINAL) 0.1 MG/GM vaginal cream Use 1 gm in vagina at hs x 14 days then use 2-3 x weekly (Patient not taking: Reported on 03/05/2015) 42.5 g 3 Taking  . levofloxacin (LEVAQUIN) 500 MG tablet 7 day course starting on 8/16   Completed Course at Unknown time   Scheduled: .  stroke: mapping our early stages of recovery book   Does not apply Once  . amphetamine-dextroamphetamine  20 mg Oral BID  . aspirin  300 mg Rectal Daily   Or  . aspirin  325 mg Oral Daily  . azaTHIOprine  50 mg Oral Daily  . calcium-vitamin D  1 tablet Oral BID  . ciprofloxacin-dexamethasone  2 drop Left Ear Daily  . cloNIDine  0.2 mg Oral BID  . dicyclomine  10 mg Oral TID AC  . DULoxetine  60 mg Oral BID  . enoxaparin (LOVENOX) injection  40 mg Subcutaneous Q24H  . furosemide  40 mg Oral QODAY  . isosorbide mononitrate  30 mg Oral Daily  . lidocaine  1 patch Transdermal Q24H  . lisinopril  40 mg Oral Daily  . metoprolol succinate  75 mg Oral Daily  . nicotine  21 mg Transdermal Daily  . pantoprazole  40 mg Oral Daily  . prazosin  2 mg Oral QHS   Continuous: . sodium chloride     WEX:[id number], gabapentin  Assesment: She has had a lacunar infarction. She has multiple medical problems at baseline including hypertension that has recently become uncontrolled. She has had ongoing tobacco abuse and understands that she must stop this now.  She has a history of chest pain but had normal stress echocardiogram in 2011. She will need to see a cardiologist as an outpatient to make sure she doesn't have any other vascular abnormalities.  She has autoimmune hepatitis and has been following with a hepatologist  She has chronic pain and follows in a pain clinic and has been on methadone long-term she is also on duloxetine both for pain  and for depression and fibromyalgia and posttraumatic stress disorder  She has sleep apnea on C Pap which is stable Active Problems:   TOBACCO ABUSE   Hypertension   CVA (cerebral infarction)    Plan: Long discussion with her family and the patient including her parents and her children. Obviously the major goal now is to get her blood pressure controlled. Since she is only 44 years old and has now had a vascular event I told her we are going to have to work hard on having her get to a much healthier lifestyle including tobacco cessation, weight  loss, exercise program. She understands and wants to go ahead and do that. I will request neurology consultation. Try to gradually reduce her blood pressure without any abrupt drops    LOS: 1 day   Lindsay Walsh 03/06/2015, 9:59 AM

## 2015-03-06 NOTE — Evaluation (Signed)
Physical Therapy Evaluation Patient Details Name: Lindsay Walsh MRN: [id number] DOB: February 24, 1971 Today's Date: 03/06/2015   History of Present Illness  This is a 44 year old lady who has known history of hypertension and who noticed that her blood pressure was significantly elevated 3-4 days ago. She saw her primary care physician, Dr. Luan Pulling for her hypertension. Clonidine was added to her medications for hypertension. She also at this time was describing left-sided facial numbness and left arm numbness. Dr. Luan Pulling ordered a CT brain scan 2 days ago. This did not show any evidence of acute CVA. She presents again because of concern of elevated blood pressure and continued symptoms. She does not describe significant weakness in the left side but sensory symptoms rather motor symptoms. She denies any headache, visual changes or speech difficulties. There is no nausea or vomiting. She has not had any syncopal episodes. There is no chest pain or palpitations.she continues to smoke cigarettes. There is a history of narcotic dependence also. Evaluation in the emergency room with MRI brain scan shows acute small lacunar infarct in the right midbrain  Clinical Impression  Pt is functional but does exhibit some slight weakness in Lt LE as well as some balance deficits.  Pt would benefit from out-patient PT to maximize functional ability.    Follow Up Recommendations Outpatient PT    Equipment Recommendations    None recommended by PT       Precautions / Restrictions Precautions Precautions: None Restrictions Weight Bearing Restrictions: No      Mobility  Bed Mobility Overal bed mobility: Independent                Transfers Overall transfer level: Independent                  Ambulation/Gait Ambulation/Gait assistance: Independent Ambulation Distance (Feet): 120 Feet Assistive device: None Gait Pattern/deviations: Decreased step length - left;Decreased step length -  right   Gait velocity interpretation: Below normal speed for age/gender          Balance Overall balance assessment:  (single leg stance Rt 10 seconds L 3)                                           Pertinent Vitals/Pain Pain Assessment: No/denies pain    Home Living Family/patient expects to be discharged to:: Private residence Living Arrangements: Spouse/significant other Available Help at Discharge: Family Type of Home: House       Home Layout: One level        Prior Function Level of Independence: Independent               Extremity/Trunk Assessment               Lower Extremity Assessment: Overall WFL for tasks assessed               Cognition Arousal/Alertness: Awake/alert Behavior During Therapy: WFL for tasks assessed/performed Overall Cognitive Status: Within Functional Limits for tasks assessed                      General Comments      Exercises        Assessment/Plan    PT Assessment All further PT needs can be met in the next venue of care  PT Diagnosis Other (comment) (decreased balance)   PT Problem List Decreased balance  PT Treatment Interventions     PT Goals (Current goals can be found in the Care Plan section)      Frequency     Barriers to discharge                       End of Session Equipment Utilized During Treatment: Gait belt Activity Tolerance: Patient tolerated treatment well Patient left: in bed;with call bell/phone within reach;with family/visitor present      Functional Limitation: Mobility: Walking and moving around Mobility: Walking and Moving Around Current Status (F3832): At least 1 percent but less than 20 percent impaired, limited or restricted Mobility: Walking and Moving Around Goal Status [phone]): At least 1 percent but less than 20 percent impaired, limited or restricted Mobility: Walking and Moving Around Discharge Status [phone]): At least 1 percent  but less than 20 percent impaired, limited or restricted    Time: 4599-7741 PT Time Calculation (min) (ACUTE ONLY): 26 min   Charges:   PT Evaluation $Initial PT Evaluation Tier I: 1 Procedure     PT G Codes:   PT G-Codes **NOT FOR INPATIENT CLASS** Functional Limitation: Mobility: Walking and moving around Mobility: Walking and Moving Around Current Status (S2395): At least 1 percent but less than 20 percent impaired, limited or restricted Mobility: Walking and Moving Around Goal Status [phone]): At least 1 percent but less than 20 percent impaired, limited or restricted Mobility: Walking and Moving Around Discharge Status [phone]): At least 1 percent but less than 20 percent impaired, limited or restricted   Rayetta Humphrey, PT CLT [phone] 03/06/2015, 9:25 AM

## 2015-03-07 DIAGNOSIS — I10 Essential (primary) hypertension: Secondary | ICD-10-CM | POA: Diagnosis present

## 2015-03-07 LAB — RAPID URINE DRUG SCREEN, HOSP PERFORMED
AMPHETAMINES: NOT DETECTED
BENZODIAZEPINES: NOT DETECTED
Barbiturates: NOT DETECTED
Cocaine: NOT DETECTED
Opiates: NOT DETECTED
TETRAHYDROCANNABINOL: NOT DETECTED

## 2015-03-07 NOTE — Progress Notes (Signed)
Subjective: She says she feels better. She still has some tingling of her fingertips and some weakness of her left leg. She has no other new complaints.  Objective: Vital signs in last 24 hours: Temp:  [97.7 F (36.5 C)-98.1 F (36.7 C)] 97.7 F (36.5 C) (08/28 0646) Pulse Rate:  [62-69] 66 (08/28 0646) Resp:  [18] 18 (08/28 0646) BP: (100-138)/(58-83) 128/78 mmHg (08/28 0646) SpO2:  [98 %-100 %] 99 % (08/28 0646) Weight change:     Intake/Output from previous day: 08/27 0701 - 08/28 0700 In: 1560 [P.O.:960; I.V.:600] Out: 1350 [Urine:1350]  PHYSICAL EXAM General appearance: alert, cooperative and no distress Resp: clear to auscultation bilaterally Cardio: regular rate and rhythm, S1, S2 normal, no murmur, click, rub or gallop GI: soft, non-tender; bowel sounds normal; no masses,  no organomegaly Extremities: extremities normal, atraumatic, no cyanosis or edema  Lab Results:  Results for orders placed or performed during the hospital encounter of 03/05/15 (from the past 48 hour(s))  Comprehensive metabolic panel     Status: Abnormal   Collection Time: 03/05/15  2:32 PM  Result Value Ref Range   Sodium 139 135 - 145 mmol/L   Potassium 3.7 3.5 - 5.1 mmol/L   Chloride 105 101 - 111 mmol/L   CO2 28 22 - 32 mmol/L   Glucose, Bld 106 (H) 65 - 99 mg/dL   BUN 8 6 - 20 mg/dL   Creatinine, Ser 0.60 0.44 - 1.00 mg/dL   Calcium 8.5 (L) 8.9 - 10.3 mg/dL   Total Protein 6.8 6.5 - 8.1 g/dL   Albumin 3.6 3.5 - 5.0 g/dL   AST 21 15 - 41 U/L   ALT 17 14 - 54 U/L   Alkaline Phosphatase 81 38 - 126 U/L   Total Bilirubin 0.4 0.3 - 1.2 mg/dL   GFR calc non Af Amer >60 >60 mL/min   GFR calc Af Amer >60 >60 mL/min    Comment: (NOTE) The eGFR has been calculated using the CKD EPI equation. This calculation has not been validated in all clinical situations. eGFR's persistently <60 mL/min signify possible Chronic Kidney Disease.    Anion gap 6 5 - 15  CBC with Differential/Platelet      Status: Abnormal   Collection Time: 03/05/15  2:32 PM  Result Value Ref Range   WBC 8.2 4.0 - 10.5 K/uL   RBC 3.96 3.87 - 5.11 MIL/uL   Hemoglobin 12.3 12.0 - 15.0 g/dL   HCT 35.4 (L) 36.0 - 46.0 %   MCV 89.4 78.0 - 100.0 fL   MCH 31.1 26.0 - 34.0 pg   MCHC 34.7 30.0 - 36.0 g/dL   RDW 12.2 11.5 - 15.5 %   Platelets 230 150 - 400 K/uL   Neutrophils Relative % 63 43 - 77 %   Neutro Abs 5.2 1.7 - 7.7 K/uL   Lymphocytes Relative 28 12 - 46 %   Lymphs Abs 2.3 0.7 - 4.0 K/uL   Monocytes Relative 8 3 - 12 %   Monocytes Absolute 0.7 0.1 - 1.0 K/uL   Eosinophils Relative 1 0 - 5 %   Eosinophils Absolute 0.1 0.0 - 0.7 K/uL   Basophils Relative 0 0 - 1 %   Basophils Absolute 0.0 0.0 - 0.1 K/uL  Lipid panel     Status: Abnormal   Collection Time: 03/06/15  5:22 AM  Result Value Ref Range   Cholesterol 151 0 - 200 mg/dL   Triglycerides 79 <150 mg/dL   HDL  31 (L) >40 mg/dL   Total CHOL/HDL Ratio 4.9 RATIO   VLDL 16 0 - 40 mg/dL   LDL Cholesterol 104 (H) 0 - 99 mg/dL    Comment:        Total Cholesterol/HDL:CHD Risk Coronary Heart Disease Risk Table                     Men   Women  1/2 Average Risk   3.4   3.3  Average Risk       5.0   4.4  2 X Average Risk   9.6   7.1  3 X Average Risk  23.4   11.0        Use the calculated Patient Ratio above and the CHD Risk Table to determine the patient's CHD Risk.        ATP III CLASSIFICATION (LDL):  <100     mg/dL   Optimal  100-129  mg/dL   Near or Above                    Optimal  130-159  mg/dL   Borderline  160-189  mg/dL   High  >190     mg/dL   Very High   Urine rapid drug screen (hosp performed)not at [facility]     Status: None   Collection Time: 03/07/15 12:36 AM  Result Value Ref Range   Opiates NONE DETECTED NONE DETECTED   Cocaine NONE DETECTED NONE DETECTED   Benzodiazepines NONE DETECTED NONE DETECTED   Amphetamines NONE DETECTED NONE DETECTED   Tetrahydrocannabinol NONE DETECTED NONE DETECTED   Barbiturates NONE DETECTED  NONE DETECTED    Comment:        DRUG SCREEN FOR MEDICAL PURPOSES ONLY.  IF CONFIRMATION IS NEEDED FOR ANY PURPOSE, NOTIFY LAB WITHIN 5 DAYS.        LOWEST DETECTABLE LIMITS FOR URINE DRUG SCREEN Drug Class       Cutoff (ng/mL) Amphetamine      1000 Barbiturate      200 Benzodiazepine   211 Tricyclics       941 Opiates          300 Cocaine          300 THC              50     ABGS No results for input(s): PHART, PO2ART, TCO2, HCO3 in the last 72 hours.  Invalid input(s): PCO2 CULTURES No results found for this or any previous visit (from the past 240 hour(s)). Studies/Results: Mr Virgel Paling DE Contrast  03/05/2015   CLINICAL DATA:  Stroke  EXAM: MRA HEAD WITHOUT CONTRAST  TECHNIQUE: Angiographic images of the Circle of Willis were obtained using MRA technique without intravenous contrast.  COMPARISON:  MRI head 03/05/2015  FINDINGS: Both vertebral arteries are patent to the basilar. AICA patent bilaterally. PICA not visualized. Basilar widely patent. Superior cerebellar and posterior cerebral arteries patent bilaterally. Fetal origin right posterior cerebral artery with hypoplastic right P1 segment.  Internal carotid artery widely patent bilaterally. Anterior and middle cerebral arteries widely patent bilaterally  Negative for cerebral aneurysm.  IMPRESSION: Negative   Electronically Signed   By: Franchot Gallo M.D.   On: 03/05/2015 21:11   Mr Brain Wo Contrast  03/05/2015   CLINICAL DATA:  44 year old female with left side weakness and numbness. Initial encounter.  EXAM: MRI HEAD WITHOUT CONTRAST  TECHNIQUE: Multiplanar, multiecho pulse sequences of the brain and surrounding  structures were obtained without intravenous contrast.  COMPARISON:  Head CT without contrast 03/02/2015, brain MRI 04/25/2011, and earlier  FINDINGS: 5 mm round focus of restricted diffusion in the right superior midbrain, just lateral to the red nucleus an just inferior to the right thalamus. Associated T2 and  FLAIR hyperintensity without hemorrhage or mass effect.  No other restricted diffusion. Major intracranial vascular flow voids are within normal limits. Fetal type right PCA origin suspected.  Elsewhere Stable and normal gray and white matter signal. No cortical encephalomalacia or chronic cerebral blood products.  No midline shift, mass effect, evidence of mass lesion, ventriculomegaly, extra-axial collection or acute intracranial hemorrhage. Cervicomedullary junction and pituitary are within normal limits. Negative visualized cervical spine. Visualized bone marrow signal is within normal limits. Mastoids are clear. Trace paranasal sinus mucosal thickening. Negative orbit and scalp soft tissues.  IMPRESSION: 1. Small acute lacunar infarct in the right midbrain. No hemorrhage or mass effect. 2. Elsewhere stable and negative noncontrast MRI appearance of the brain.   Electronically Signed   By: Genevie Ann M.D.   On: 03/05/2015 15:24   US Carotid Bilateral  03/06/2015   CLINICAL DATA:  Stroke, history smoking, hypertension, dysrhythmia  EXAM: BILATERAL CAROTID DUPLEX ULTRASOUND  TECHNIQUE: Pearline Cables scale imaging, color Doppler and duplex ultrasound were performed of bilateral carotid and vertebral arteries in the neck.  COMPARISON:  09/15/2004  FINDINGS: Criteria: Quantification of carotid stenosis is based on velocity parameters that correlate the residual internal carotid diameter with NASCET-based stenosis levels, using the diameter of the distal internal carotid lumen as the denominator for stenosis measurement.  The following velocity measurements were obtained:  RIGHT  ICA:  71/22 cm/sec  CCA:  70/26 cm/sec  SYSTOLIC ICA/CCA RATIO:  1.0  DIASTOLIC ICA/CCA RATIO:  1.0  ECA:  148 cm/sec  LEFT  ICA:  92/37 cm/sec  CCA:  37/85 cm/sec  SYSTOLIC ICA/CCA RATIO:  1.1  DIASTOLIC ICA/CCA RATIO:  1.6  ECA:  133 cm/sec  RIGHT CAROTID ARTERY: Tiny hypoechoic plaque proximal RIGHT ICA. Laminar flow by color Doppler imaging.  Waveforms unremarkable. No high velocity jets.  RIGHT VERTEBRAL ARTERY:  Patent, antegrade  LEFT CAROTID ARTERY: Small echogenic non shadowing plaque LEFT carotid bulb. Laminar flow by color Doppler imaging. Minimal spectral broadening proximal LEFT ICA on waveform analysis. No high velocity jets.  LEFT VERTEBRAL ARTERY:  Patent, antegrade  IMPRESSION: Minimal plaque bilaterally.  No evidence of hemodynamically significant stenosis.   Electronically Signed   By: Lavonia Dana M.D.   On: 03/06/2015 14:03    Medications:  Prior to Admission:  Prescriptions prior to admission  Medication Sig Dispense Refill Last Dose  . amphetamine-dextroamphetamine (ADDERALL) 20 MG tablet Take 1 tablet (20 mg total) by mouth 2 (two) times daily. 60 tablet 0 03/04/2015 at Unknown time  . Ascorbic Acid (VITAMIN C) 1000 MG tablet Take 1,000 mg by mouth every morning.    03/04/2015 at Unknown time  . azaTHIOprine (IMURAN) 50 MG tablet take 1 tablet by mouth once daily 30 tablet 5 03/04/2015 at Unknown time  . calcium-vitamin D (OS-CAL 500 + D) 500-200 MG-UNIT per tablet Take 1 tablet by mouth 2 (two) times daily.    03/04/2015 at Unknown time  . CIPRODEX otic suspension Place 2 drops into the left ear daily.    03/05/2015 at Unknown time  . cloNIDine (CATAPRES) 0.1 MG tablet Take 0.1 mg by mouth 2 (two) times daily.    03/04/2015 at Unknown time  . dicyclomine (  BENTYL) 10 MG capsule Take 1 capsule (10 mg total) by mouth 3 (three) times daily before meals. For cramps (Patient taking differently: Take 10 mg by mouth daily as needed. For cramps) 90 capsule 5 Past Week at Unknown time  . DULoxetine (CYMBALTA) 60 MG capsule Take 1 capsule (60 mg total) by mouth 2 (two) times daily. 60 capsule 2 03/04/2015 at Unknown time  . estradiol (CLIMARA - DOSED IN MG/24 HR) 0.075 mg/24hr Place 1 patch onto the skin once a week.     Past Week at Unknown time  . furosemide (LASIX) 40 MG tablet Take 40 mg by mouth every other day.    03/04/2015 at  Unknown time  . gabapentin (NEURONTIN) 300 MG capsule Take 300 mg by mouth at bedtime as needed (for pain).    Past Week at Unknown time  . GARLIC PO Take 6,063 mg by mouth daily.    03/04/2015 at Unknown time  . isosorbide mononitrate (IMDUR) 30 MG 24 hr tablet TAKE ONE (1) TABLET EACH DAY 30 tablet 11 03/04/2015 at Unknown time  . lidocaine (LIDODERM) 5 % Place 1 patch onto the skin daily. Remove & Discard patch within 12 hours or as directed by MD (Patient taking differently: Place 1 patch onto the skin daily as needed (for pain). ) 30 patch 0 Past Month at Unknown time  . lisinopril (PRINIVIL,ZESTRIL) 40 MG tablet Take 40 mg by mouth daily.    03/05/2015 at Unknown time  . methadone (DOLOPHINE) 5 MG tablet Take 5 mg by mouth 4 (four) times daily as needed. For pain   03/04/2015 at Unknown time  . metoprolol succinate (TOPROL-XL) 25 MG 24 hr tablet take 3 tablet by mouth every morning 90 tablet 6 03/04/2015 at 1200n  . Multiple Vitamins-Minerals (CENTRUM) tablet Take 1 tablet by mouth daily.   03/04/2015 at Unknown time  . NEXIUM 40 MG capsule take 1 capsule by mouth every morning 30 capsule 5 03/04/2015 at Unknown time  . pramipexole (MIRAPEX) 0.25 MG tablet Take 0.25 mg by mouth at bedtime as needed. For restless legs   03/04/2015 at Unknown time  . prazosin (MINIPRESS) 2 MG capsule Take 1 capsule (2 mg total) by mouth at bedtime. 30 capsule 2 03/04/2015 at Unknown time  . PROAIR HFA 108 (90 BASE) MCG/ACT inhaler Inhale 1-2 puffs into the lungs every 6 (six) hours as needed for wheezing or shortness of breath.    unknown  . Probiotic Product (ALIGN PO) Take 1 capsule by mouth daily.    03/04/2015 at Unknown time  . estradiol (ESTRACE VAGINAL) 0.1 MG/GM vaginal cream Use 1 gm in vagina at hs x 14 days then use 2-3 x weekly (Patient not taking: Reported on 03/05/2015) 42.5 g 3 Taking  . levofloxacin (LEVAQUIN) 500 MG tablet 7 day course starting on 8/16   Completed Course at Unknown time   Scheduled: .  aspirin  300 mg Rectal Daily   Or  . aspirin  325 mg Oral Daily  . atorvastatin  80 mg Oral q1800  . azaTHIOprine  50 mg Oral Daily  . calcium-vitamin D  1 tablet Oral BID  . ciprofloxacin-dexamethasone  2 drop Left Ear Daily  . cloNIDine  0.2 mg Oral BID  . dicyclomine  10 mg Oral TID AC  . DULoxetine  60 mg Oral BID  . enoxaparin (LOVENOX) injection  40 mg Subcutaneous Q24H  . furosemide  40 mg Oral QODAY  . isosorbide mononitrate  30 mg Oral  Daily  . lidocaine  1 patch Transdermal Q24H  . lisinopril  40 mg Oral Daily  . metoprolol succinate  75 mg Oral Daily  . nicotine  21 mg Transdermal Daily  . pantoprazole  40 mg Oral Daily  . prazosin  2 mg Oral QHS   Continuous: . sodium chloride     KAJ:[id number], gabapentin, methadone  Assesment: She was admitted with a stroke. She is improving. Her echocardiogram was essentially normal. Carotid study showed minimal problem with her carotids. Her blood pressure is much improved. Active Problems:   TOBACCO ABUSE   Hypertension   CVA (cerebral infarction)    Plan: Probable discharge in the morning. I'd like to see what her blood pressure does another 24 hours and I will request neurology consultation to make sure there is not anything else that we need to do at this time    LOS: 2 days   , L 03/07/2015, 9:47 AM

## 2015-03-08 LAB — HEMOGLOBIN A1C
HEMOGLOBIN A1C: 6.3 % — AB (ref 4.8–5.6)
Mean Plasma Glucose: 134 mg/dL

## 2015-03-08 MED ORDER — HYDRALAZINE HCL 50 MG PO TABS: 50.0000 mg | ORAL_TABLET | Freq: Three times a day (TID) | ORAL | Status: DC

## 2015-03-08 MED ORDER — ATORVASTATIN CALCIUM 80 MG PO TABS: 80.0000 mg | ORAL_TABLET | Freq: Every day | ORAL | Status: DC

## 2015-03-08 MED ORDER — NICOTINE 21 MG/24HR TD PT24: 21.0000 mg | MEDICATED_PATCH | Freq: Every day | TRANSDERMAL | Status: DC

## 2015-03-08 MED ORDER — STROKE: EARLY STAGES OF RECOVERY BOOK
Freq: Once | Status: DC
  Filled 2015-03-08: qty 1

## 2015-03-08 MED ORDER — LIDOCAINE 5 % EX PTCH: 1.0000 | MEDICATED_PATCH | Freq: Every day | CUTANEOUS | Status: DC | PRN

## 2015-03-08 MED ORDER — HYDRALAZINE HCL 25 MG PO TABS
50.0000 mg | ORAL_TABLET | Freq: Three times a day (TID) | ORAL | Status: DC
  Filled 2015-03-08: qty 2

## 2015-03-08 MED ORDER — DICYCLOMINE HCL 10 MG PO CAPS: 10.0000 mg | ORAL_CAPSULE | Freq: Every day | ORAL | Status: DC | PRN

## 2015-03-08 NOTE — Progress Notes (Signed)
Subjective: She feels better. She is not having any new complaints except she is not tolerating the clonidine very well. Her blood pressure is excellent  Objective: Vital signs in last 24 hours: Temp:  [97.7 F (36.5 C)-98.5 F (36.9 C)] 97.7 F (36.5 C) (08/29 0532) Pulse Rate:  [55-64] 60 (08/29 0532) Resp:  [17-18] 18 (08/29 0532) BP: (100-127)/(64-72) 125/69 mmHg (08/29 0532) SpO2:  [94 %-98 %] 98 % (08/29 0532) Weight change:  Last BM Date: 03/05/15  Intake/Output from previous day: 08/28 0701 - 08/29 0700 In: 388.3 [I.V.:388.3] Out: 450 [Urine:450]  PHYSICAL EXAM General appearance: alert, cooperative and no distress Resp: clear to auscultation bilaterally Cardio: regular rate and rhythm, S1, S2 normal, no murmur, click, rub or gallop GI: soft, non-tender; bowel sounds normal; no masses,  no organomegaly Extremities: extremities normal, atraumatic, no cyanosis or edema  Lab Results:  Results for orders placed or performed during the hospital encounter of 03/05/15 (from the past 48 hour(s))  Urine rapid drug screen (hosp performed)not at [facility]     Status: None   Collection Time: 03/07/15 12:36 AM  Result Value Ref Range   Opiates NONE DETECTED NONE DETECTED   Cocaine NONE DETECTED NONE DETECTED   Benzodiazepines NONE DETECTED NONE DETECTED   Amphetamines NONE DETECTED NONE DETECTED   Tetrahydrocannabinol NONE DETECTED NONE DETECTED   Barbiturates NONE DETECTED NONE DETECTED    Comment:        DRUG SCREEN FOR MEDICAL PURPOSES ONLY.  IF CONFIRMATION IS NEEDED FOR ANY PURPOSE, NOTIFY LAB WITHIN 5 DAYS.        LOWEST DETECTABLE LIMITS FOR URINE DRUG SCREEN Drug Class       Cutoff (ng/mL) Amphetamine      1000 Barbiturate      200 Benzodiazepine   347 Tricyclics       425 Opiates          300 Cocaine          300 THC              50     ABGS No results for input(s): PHART, PO2ART, TCO2, HCO3 in the last 72 hours.  Invalid input(s): PCO2 CULTURES No  results found for this or any previous visit (from the past 240 hour(s)). Studies/Results: US Carotid Bilateral  03/06/2015   CLINICAL DATA:  Stroke, history smoking, hypertension, dysrhythmia  EXAM: BILATERAL CAROTID DUPLEX ULTRASOUND  TECHNIQUE: Pearline Cables scale imaging, color Doppler and duplex ultrasound were performed of bilateral carotid and vertebral arteries in the neck.  COMPARISON:  09/15/2004  FINDINGS: Criteria: Quantification of carotid stenosis is based on velocity parameters that correlate the residual internal carotid diameter with NASCET-based stenosis levels, using the diameter of the distal internal carotid lumen as the denominator for stenosis measurement.  The following velocity measurements were obtained:  RIGHT  ICA:  71/22 cm/sec  CCA:  95/63 cm/sec  SYSTOLIC ICA/CCA RATIO:  1.0  DIASTOLIC ICA/CCA RATIO:  1.0  ECA:  148 cm/sec  LEFT  ICA:  92/37 cm/sec  CCA:  87/56 cm/sec  SYSTOLIC ICA/CCA RATIO:  1.1  DIASTOLIC ICA/CCA RATIO:  1.6  ECA:  133 cm/sec  RIGHT CAROTID ARTERY: Tiny hypoechoic plaque proximal RIGHT ICA. Laminar flow by color Doppler imaging. Waveforms unremarkable. No high velocity jets.  RIGHT VERTEBRAL ARTERY:  Patent, antegrade  LEFT CAROTID ARTERY: Small echogenic non shadowing plaque LEFT carotid bulb. Laminar flow by color Doppler imaging. Minimal spectral broadening proximal LEFT ICA on waveform analysis. No high  velocity jets.  LEFT VERTEBRAL ARTERY:  Patent, antegrade  IMPRESSION: Minimal plaque bilaterally.  No evidence of hemodynamically significant stenosis.   Electronically Signed   By: Lavonia Dana M.D.   On: 03/06/2015 14:03    Medications:  Prior to Admission:  Prescriptions prior to admission  Medication Sig Dispense Refill Last Dose  . amphetamine-dextroamphetamine (ADDERALL) 20 MG tablet Take 1 tablet (20 mg total) by mouth 2 (two) times daily. 60 tablet 0 03/04/2015 at Unknown time  . Ascorbic Acid (VITAMIN C) 1000 MG tablet Take 1,000 mg by mouth every  morning.    03/04/2015 at Unknown time  . azaTHIOprine (IMURAN) 50 MG tablet take 1 tablet by mouth once daily 30 tablet 5 03/04/2015 at Unknown time  . calcium-vitamin D (OS-CAL 500 + D) 500-200 MG-UNIT per tablet Take 1 tablet by mouth 2 (two) times daily.    03/04/2015 at Unknown time  . CIPRODEX otic suspension Place 2 drops into the left ear daily.    03/05/2015 at Unknown time  . cloNIDine (CATAPRES) 0.1 MG tablet Take 0.1 mg by mouth 2 (two) times daily.    03/04/2015 at Unknown time  . dicyclomine (BENTYL) 10 MG capsule Take 1 capsule (10 mg total) by mouth 3 (three) times daily before meals. For cramps (Patient taking differently: Take 10 mg by mouth daily as needed. For cramps) 90 capsule 5 Past Week at Unknown time  . DULoxetine (CYMBALTA) 60 MG capsule Take 1 capsule (60 mg total) by mouth 2 (two) times daily. 60 capsule 2 03/04/2015 at Unknown time  . estradiol (CLIMARA - DOSED IN MG/24 HR) 0.075 mg/24hr Place 1 patch onto the skin once a week.     Past Week at Unknown time  . furosemide (LASIX) 40 MG tablet Take 40 mg by mouth every other day.    03/04/2015 at Unknown time  . gabapentin (NEURONTIN) 300 MG capsule Take 300 mg by mouth at bedtime as needed (for pain).    Past Week at Unknown time  . GARLIC PO Take 1,287 mg by mouth daily.    03/04/2015 at Unknown time  . isosorbide mononitrate (IMDUR) 30 MG 24 hr tablet TAKE ONE (1) TABLET EACH DAY 30 tablet 11 03/04/2015 at Unknown time  . lidocaine (LIDODERM) 5 % Place 1 patch onto the skin daily. Remove & Discard patch within 12 hours or as directed by MD (Patient taking differently: Place 1 patch onto the skin daily as needed (for pain). ) 30 patch 0 Past Month at Unknown time  . lisinopril (PRINIVIL,ZESTRIL) 40 MG tablet Take 40 mg by mouth daily.    03/05/2015 at Unknown time  . methadone (DOLOPHINE) 5 MG tablet Take 5 mg by mouth 4 (four) times daily as needed. For pain   03/04/2015 at Unknown time  . metoprolol succinate (TOPROL-XL) 25 MG 24  hr tablet take 3 tablet by mouth every morning 90 tablet 6 03/04/2015 at 1200n  . Multiple Vitamins-Minerals (CENTRUM) tablet Take 1 tablet by mouth daily.   03/04/2015 at Unknown time  . NEXIUM 40 MG capsule take 1 capsule by mouth every morning 30 capsule 5 03/04/2015 at Unknown time  . pramipexole (MIRAPEX) 0.25 MG tablet Take 0.25 mg by mouth at bedtime as needed. For restless legs   03/04/2015 at Unknown time  . prazosin (MINIPRESS) 2 MG capsule Take 1 capsule (2 mg total) by mouth at bedtime. 30 capsule 2 03/04/2015 at Unknown time  . PROAIR HFA 108 (90 BASE) MCG/ACT inhaler  Inhale 1-2 puffs into the lungs every 6 (six) hours as needed for wheezing or shortness of breath.    unknown  . Probiotic Product (ALIGN PO) Take 1 capsule by mouth daily.    03/04/2015 at Unknown time  . estradiol (ESTRACE VAGINAL) 0.1 MG/GM vaginal cream Use 1 gm in vagina at hs x 14 days then use 2-3 x weekly (Patient not taking: Reported on 03/05/2015) 42.5 g 3 Taking  . levofloxacin (LEVAQUIN) 500 MG tablet 7 day course starting on 8/16   Completed Course at Unknown time   Scheduled: . aspirin  300 mg Rectal Daily   Or  . aspirin  325 mg Oral Daily  . atorvastatin  80 mg Oral q1800  . azaTHIOprine  50 mg Oral Daily  . calcium-vitamin D  1 tablet Oral BID  . ciprofloxacin-dexamethasone  2 drop Left Ear Daily  . dicyclomine  10 mg Oral TID AC  . DULoxetine  60 mg Oral BID  . enoxaparin (LOVENOX) injection  40 mg Subcutaneous Q24H  . furosemide  40 mg Oral QODAY  . hydrALAZINE  50 mg Oral 3 times per day  . isosorbide mononitrate  30 mg Oral Daily  . lidocaine  1 patch Transdermal Q24H  . lisinopril  40 mg Oral Daily  . metoprolol succinate  75 mg Oral Daily  . nicotine  21 mg Transdermal Daily  . pantoprazole  40 mg Oral Daily  . prazosin  2 mg Oral QHS   Continuous: . sodium chloride 50 mL/hr at 03/07/15 2214   [id number], gabapentin, methadone  Assesment: She had a stroke. She had malignant  hypertension which is much better controlled. She is really not having much in the way of symptoms from her stroke now. She is having trouble tolerating clonidine Active Problems:   TOBACCO ABUSE   Myalgia and myositis   Hypertension   Hepatitis, autoimmune   Degenerative disc disease, lumbar   Obesity   CVA (cerebral infarction)   Essential hypertension, malignant    Plan: Switch her to hydralazine. I will discuss with neurology and if there is nothing that needs to be done as an inpatient and I will send her home    LOS: 3 days   Jerald Villalona L 03/08/2015, 8:42 AM

## 2015-03-08 NOTE — Care Management Important Message (Signed)
Important Message  Patient Details  Name: BIANCE MONCRIEF MRN: [id number] Date of Birth: 05-Sep-1970   Medicare Important Message Given:  Yes-second notification given    Sherald Barge, RN 03/08/2015, 11:26 AM

## 2015-03-08 NOTE — Progress Notes (Signed)
Patient discharged with instructions, prescription, and care notes.  Verbalized understanding via teach back.  IV was removed and the site was WNL. Patient voiced no further complaints or concerns at the time of discharge.  Appointments scheduled per instructions.  Patient left the floor via w/c with staff and family in stable condition. 

## 2015-03-08 NOTE — Progress Notes (Signed)
OT Screen  Patient Details Name: Lindsay Walsh MRN: [id number] DOB: Jan 17, 1971   OT Screen:     Reason evaluation not completed: Pt screened for OT needs. Pt awake and alert this am. Pt reports her legs feel wobbly but otherwise original symptoms have resolved. Pt demonstrates good BUE range of motion, LUE strength slightly weaker than RUE, however is functional at 4/5. Pt is at baseline with BADL tasks, no further acute OT needs at this time.   [name], OTR/L  [phone]  03/08/2015, 8:53 AM

## 2015-03-08 NOTE — Care Management Note (Signed)
Case Management Note  Patient Details  Name: NAVIAH BELFIELD MRN: [id number] Date of Birth: [birth year]  Expected Discharge Date:  03/08/15               Expected Discharge Plan:  Home/Self Care  In-House Referral:  NA  Discharge planning Services  CM Consult  Post Acute Care Choice:  NA Choice offered to:  NA  DME Arranged:    DME Agency:     HH Arranged:    [place] Agency:     Status of Service:  Completed, signed off  Medicare Important Message Given:  Yes-second notification given Date Medicare IM Given:    Medicare IM give by:    Date Additional Medicare IM Given:    Additional Medicare Important Message give by:     If discussed at [place] of Stay Meetings, dates discussed:    Additional Comments: Pt admitted for HTN and CVA work-up. Pt is from home, lives with her boyfriend. Pt is independent at baseline with use a walker. Pt goes to OP rehab in [place] for wound care. PT has recommended OP PT. Referral sent to [facility] to add PT services. Pt anticipates DC later this afternoon or in the morning. Pt asking for Rx for nicotine patches at DC, MD made aware.   Sherald Barge, RN 03/08/2015, 11:27 AM

## 2015-03-10 DIAGNOSIS — E119 Type 2 diabetes mellitus without complications: Secondary | ICD-10-CM | POA: Diagnosis not present

## 2015-03-10 DIAGNOSIS — I10 Essential (primary) hypertension: Secondary | ICD-10-CM | POA: Diagnosis not present

## 2015-03-10 DIAGNOSIS — E6609 Other obesity due to excess calories: Secondary | ICD-10-CM | POA: Diagnosis not present

## 2015-03-10 DIAGNOSIS — I639 Cerebral infarction, unspecified: Secondary | ICD-10-CM | POA: Diagnosis not present

## 2015-03-12 ENCOUNTER — Other Ambulatory Visit (HOSPITAL_COMMUNITY): Payer: Self-pay | Admitting: Psychiatry

## 2015-03-12 ENCOUNTER — Telehealth (HOSPITAL_COMMUNITY): Payer: Self-pay | Admitting: *Deleted

## 2015-03-12 DIAGNOSIS — F4310 Post-traumatic stress disorder, unspecified: Secondary | ICD-10-CM

## 2015-03-12 DIAGNOSIS — F515 Nightmare disorder: Principal | ICD-10-CM

## 2015-03-12 DIAGNOSIS — F4312 Post-traumatic stress disorder, chronic: Secondary | ICD-10-CM

## 2015-03-12 MED ORDER — PRAZOSIN HCL 2 MG PO CAPS: 2.0000 mg | ORAL_CAPSULE | Freq: Every day | ORAL | Status: DC

## 2015-03-12 NOTE — Telephone Encounter (Signed)
Pt pharmacy requesting refills for pt Prazosin. Pt medication was last filled 11-13-14 with 30 tabs 2 refills. Pt no showed for 02-23-15 appt. Pt is rescheduled for 04-05-15. Pharmacy number is [phone].

## 2015-03-12 NOTE — Telephone Encounter (Signed)
sent 

## 2015-03-22 DIAGNOSIS — R262 Difficulty in walking, not elsewhere classified: Secondary | ICD-10-CM | POA: Diagnosis not present

## 2015-03-22 DIAGNOSIS — M25571 Pain in right ankle and joints of right foot: Secondary | ICD-10-CM | POA: Diagnosis not present

## 2015-03-22 DIAGNOSIS — R2681 Unsteadiness on feet: Secondary | ICD-10-CM | POA: Diagnosis not present

## 2015-03-22 DIAGNOSIS — M6281 Muscle weakness (generalized): Secondary | ICD-10-CM | POA: Diagnosis not present

## 2015-03-22 NOTE — Discharge Summary (Signed)
Physician Discharge Summary  Patient ID: Lindsay Walsh MRN: [id number] DOB/AGE: 45-12-1970 45 y.o. Primary Care Physician:Addalyn Speedy L, MD Admit date: 03/05/2015 Discharge date: 03/22/2015    Discharge Diagnoses:   Active Problems:   TOBACCO ABUSE   Myalgia and myositis   Hypertension   Hepatitis, autoimmune   Degenerative disc disease, lumbar   Obesity   CVA (cerebral infarction)   Essential hypertension, malignant     Medication List    STOP taking these medications        cloNIDine 0.1 MG tablet  Commonly known as:  CATAPRES     estradiol 0.1 MG/GM vaginal cream  Commonly known as:  ESTRACE VAGINAL     levofloxacin 500 MG tablet  Commonly known as:  LEVAQUIN     prazosin 2 MG capsule  Commonly known as:  MINIPRESS      TAKE these medications        ALIGN PO  Take 1 capsule by mouth daily.     amphetamine-dextroamphetamine 20 MG tablet  Commonly known as:  ADDERALL  Take 1 tablet (20 mg total) by mouth 2 (two) times daily.     atorvastatin 80 MG tablet  Commonly known as:  LIPITOR  Take 1 tablet (80 mg total) by mouth daily at 6 PM.     azaTHIOprine 50 MG tablet  Commonly known as:  IMURAN  take 1 tablet by mouth once daily     CENTRUM tablet  Take 1 tablet by mouth daily.     CIPRODEX otic suspension  Generic drug:  ciprofloxacin-dexamethasone  Place 2 drops into the left ear daily.     dicyclomine 10 MG capsule  Commonly known as:  BENTYL  Take 1 capsule (10 mg total) by mouth daily as needed. For cramps     DULoxetine 60 MG capsule  Commonly known as:  CYMBALTA  Take 1 capsule (60 mg total) by mouth 2 (two) times daily.     estradiol 0.075 mg/24hr patch  Commonly known as:  CLIMARA - Dosed in mg/24 hr  Place 1 patch onto the skin once a week.     furosemide 40 MG tablet  Commonly known as:  LASIX  Take 40 mg by mouth every other day.     gabapentin 300 MG capsule  Commonly known as:  NEURONTIN  Take 300 mg by mouth at bedtime  as needed (for pain).     GARLIC PO  Take 0,093 mg by mouth daily.     hydrALAZINE 50 MG tablet  Commonly known as:  APRESOLINE  Take 1 tablet (50 mg total) by mouth every 8 (eight) hours.     isosorbide mononitrate 30 MG 24 hr tablet  Commonly known as:  IMDUR  TAKE ONE (1) TABLET EACH DAY     lidocaine 5 %  Commonly known as:  LIDODERM  Place 1 patch onto the skin daily as needed (for pain).     lisinopril 40 MG tablet  Commonly known as:  PRINIVIL,ZESTRIL  Take 40 mg by mouth daily.     methadone 5 MG tablet  Commonly known as:  DOLOPHINE  Take 5 mg by mouth 4 (four) times daily as needed. For pain     metoprolol succinate 25 MG 24 hr tablet  Commonly known as:  TOPROL-XL  take 3 tablet by mouth every morning     NEXIUM 40 MG capsule  Generic drug:  esomeprazole  take 1 capsule by mouth every morning  nicotine 21 mg/24hr patch  Commonly known as:  NICODERM CQ - dosed in mg/24 hours  Place 1 patch (21 mg total) onto the skin daily.     OS-CAL 500 + D 500-200 MG-UNIT per tablet  Generic drug:  calcium-vitamin D  Take 1 tablet by mouth 2 (two) times daily.     pramipexole 0.25 MG tablet  Commonly known as:  MIRAPEX  Take 0.25 mg by mouth at bedtime as needed. For restless legs     PROAIR HFA 108 (90 BASE) MCG/ACT inhaler  Generic drug:  albuterol  Inhale 1-2 puffs into the lungs every 6 (six) hours as needed for wheezing or shortness of breath.     vitamin C 1000 MG tablet  Take 1,000 mg by mouth every morning.        Discharged Condition: Improved    Consults: None  Significant Diagnostic Studies: Ct Head Wo Contrast  03/02/2015   CLINICAL DATA:  LEFT hand numbness, numbness LEFT side of nose and upper lip since last night, elevated blood pressure, history hypertension, diet-controlled diabetes mellitus, smoking, sleep apnea  EXAM: CT HEAD WITHOUT CONTRAST  TECHNIQUE: Contiguous axial images were obtained from the base of the skull through the vertex  without intravenous contrast.  COMPARISON:  07/19/2004  FINDINGS: Normal ventricular morphology.  No midline shift or mass effect.  Normal appearance of brain parenchyma.  No intracranial hemorrhage, mass lesion, or evidence acute infarction.  No extra-axial fluid collections.  Visualized paranasal sinuses and mastoid air cells clear.  Bones unremarkable.  IMPRESSION: Normal exam.   Electronically Signed   By: Lavonia Dana M.D.   On: 03/02/2015 16:45   Mr Jodene Nam Head Wo Contrast  03/05/2015   CLINICAL DATA:  Stroke  EXAM: MRA HEAD WITHOUT CONTRAST  TECHNIQUE: Angiographic images of the Circle of Willis were obtained using MRA technique without intravenous contrast.  COMPARISON:  MRI head 03/05/2015  FINDINGS: Both vertebral arteries are patent to the basilar. AICA patent bilaterally. PICA not visualized. Basilar widely patent. Superior cerebellar and posterior cerebral arteries patent bilaterally. Fetal origin right posterior cerebral artery with hypoplastic right P1 segment.  Internal carotid artery widely patent bilaterally. Anterior and middle cerebral arteries widely patent bilaterally  Negative for cerebral aneurysm.  IMPRESSION: Negative   Electronically Signed   By: Franchot Gallo M.D.   On: 03/05/2015 21:11   Mr Brain Wo Contrast  03/05/2015   CLINICAL DATA:  44 year old female with left side weakness and numbness. Initial encounter.  EXAM: MRI HEAD WITHOUT CONTRAST  TECHNIQUE: Multiplanar, multiecho pulse sequences of the brain and surrounding structures were obtained without intravenous contrast.  COMPARISON:  Head CT without contrast 03/02/2015, brain MRI 04/25/2011, and earlier  FINDINGS: 5 mm round focus of restricted diffusion in the right superior midbrain, just lateral to the red nucleus an just inferior to the right thalamus. Associated T2 and FLAIR hyperintensity without hemorrhage or mass effect.  No other restricted diffusion. Major intracranial vascular flow voids are within normal limits.  Fetal type right PCA origin suspected.  Elsewhere Stable and normal gray and white matter signal. No cortical encephalomalacia or chronic cerebral blood products.  No midline shift, mass effect, evidence of mass lesion, ventriculomegaly, extra-axial collection or acute intracranial hemorrhage. Cervicomedullary junction and pituitary are within normal limits. Negative visualized cervical spine. Visualized bone marrow signal is within normal limits. Mastoids are clear. Trace paranasal sinus mucosal thickening. Negative orbit and scalp soft tissues.  IMPRESSION: 1. Small acute lacunar infarct in the  right midbrain. No hemorrhage or mass effect. 2. Elsewhere stable and negative noncontrast MRI appearance of the brain.   Electronically Signed   By: Genevie Ann M.D.   On: 03/05/2015 15:24   US Carotid Bilateral  03/06/2015   CLINICAL DATA:  Stroke, history smoking, hypertension, dysrhythmia  EXAM: BILATERAL CAROTID DUPLEX ULTRASOUND  TECHNIQUE: Pearline Cables scale imaging, color Doppler and duplex ultrasound were performed of bilateral carotid and vertebral arteries in the neck.  COMPARISON:  09/15/2004  FINDINGS: Criteria: Quantification of carotid stenosis is based on velocity parameters that correlate the residual internal carotid diameter with NASCET-based stenosis levels, using the diameter of the distal internal carotid lumen as the denominator for stenosis measurement.  The following velocity measurements were obtained:  RIGHT  ICA:  71/22 cm/sec  CCA:  03/47 cm/sec  SYSTOLIC ICA/CCA RATIO:  1.0  DIASTOLIC ICA/CCA RATIO:  1.0  ECA:  148 cm/sec  LEFT  ICA:  92/37 cm/sec  CCA:  42/59 cm/sec  SYSTOLIC ICA/CCA RATIO:  1.1  DIASTOLIC ICA/CCA RATIO:  1.6  ECA:  133 cm/sec  RIGHT CAROTID ARTERY: Tiny hypoechoic plaque proximal RIGHT ICA. Laminar flow by color Doppler imaging. Waveforms unremarkable. No high velocity jets.  RIGHT VERTEBRAL ARTERY:  Patent, antegrade  LEFT CAROTID ARTERY: Small echogenic non shadowing plaque LEFT  carotid bulb. Laminar flow by color Doppler imaging. Minimal spectral broadening proximal LEFT ICA on waveform analysis. No high velocity jets.  LEFT VERTEBRAL ARTERY:  Patent, antegrade  IMPRESSION: Minimal plaque bilaterally.  No evidence of hemodynamically significant stenosis.   Electronically Signed   By: Lavonia Dana M.D.   On: 03/06/2015 14:03    Lab Results: Basic Metabolic Panel: No results for input(s): NA, K, CL, CO2, GLUCOSE, BUN, CREATININE, CALCIUM, MG, PHOS in the last 72 hours. Liver Function Tests: No results for input(s): AST, ALT, ALKPHOS, BILITOT, PROT, ALBUMIN in the last 72 hours.   CBC: No results for input(s): WBC, NEUTROABS, HGB, HCT, MCV, PLT in the last 72 hours.  No results found for this or any previous visit (from the past 240 hour(s)).   Hospital Course: This is a 44 year old who had been in my office about a week prior to admission with increased blood pressure problems. She had CT scanning done them because she had some numbness of her hand that did not show any evidence of a stroke. However she continued to have trouble and eventually came to the emergency department and had an MRI that did show she had had lacunar infarction. She was treated with blood pressure medications started on Lipitor had multiple evaluations which were okay and she was at baseline by the time of discharge. She still had some clumsiness of her left hand. Neurology consultation is arranged as an outpatient  Discharge Exam: Blood pressure 130/73, pulse 63, temperature 98.2 F (36.8 C), temperature source Oral, resp. rate 18, height 5' (1.524 m), weight 87.771 kg (193 lb 8 oz), SpO2 98 %. She is awake and alert. Her chest is clear. Heart is regular  Disposition: Home she will follow-up in my office and with neurology        Follow-up Information    Follow up with [facility], Trey Sailors, MD On 03/10/2015.   Specialty:  Neurology   Why:  For Follow up appointment from hospital stay for  Stroke.  Be there at 9:00 am   Contact information:   [address] [place] [address] [phone]       Signed: Abia Monaco L  03/22/2015, 7:22 AM

## 2015-03-26 DIAGNOSIS — M25571 Pain in right ankle and joints of right foot: Secondary | ICD-10-CM | POA: Diagnosis not present

## 2015-03-26 DIAGNOSIS — R262 Difficulty in walking, not elsewhere classified: Secondary | ICD-10-CM | POA: Diagnosis not present

## 2015-03-26 DIAGNOSIS — R2681 Unsteadiness on feet: Secondary | ICD-10-CM | POA: Diagnosis not present

## 2015-03-26 DIAGNOSIS — M6281 Muscle weakness (generalized): Secondary | ICD-10-CM | POA: Diagnosis not present

## 2015-03-30 DIAGNOSIS — M25571 Pain in right ankle and joints of right foot: Secondary | ICD-10-CM | POA: Diagnosis not present

## 2015-03-30 DIAGNOSIS — M6281 Muscle weakness (generalized): Secondary | ICD-10-CM | POA: Diagnosis not present

## 2015-03-30 DIAGNOSIS — R2681 Unsteadiness on feet: Secondary | ICD-10-CM | POA: Diagnosis not present

## 2015-03-30 DIAGNOSIS — R262 Difficulty in walking, not elsewhere classified: Secondary | ICD-10-CM | POA: Diagnosis not present

## 2015-03-31 ENCOUNTER — Encounter (INDEPENDENT_AMBULATORY_CARE_PROVIDER_SITE_OTHER): Payer: Self-pay | Admitting: *Deleted

## 2015-03-31 DIAGNOSIS — R2681 Unsteadiness on feet: Secondary | ICD-10-CM | POA: Diagnosis not present

## 2015-03-31 DIAGNOSIS — R262 Difficulty in walking, not elsewhere classified: Secondary | ICD-10-CM | POA: Diagnosis not present

## 2015-03-31 DIAGNOSIS — M25571 Pain in right ankle and joints of right foot: Secondary | ICD-10-CM | POA: Diagnosis not present

## 2015-03-31 DIAGNOSIS — M6281 Muscle weakness (generalized): Secondary | ICD-10-CM | POA: Diagnosis not present

## 2015-04-05 ENCOUNTER — Encounter (HOSPITAL_COMMUNITY): Payer: Self-pay | Admitting: Psychiatry

## 2015-04-05 ENCOUNTER — Ambulatory Visit (INDEPENDENT_AMBULATORY_CARE_PROVIDER_SITE_OTHER): Payer: Medicare Other | Admitting: Psychiatry

## 2015-04-05 VITALS — BP 138/90 | Ht 60.0 in | Wt 191.0 lb

## 2015-04-05 DIAGNOSIS — F4310 Post-traumatic stress disorder, unspecified: Secondary | ICD-10-CM

## 2015-04-05 DIAGNOSIS — F515 Nightmare disorder: Secondary | ICD-10-CM

## 2015-04-05 DIAGNOSIS — F331 Major depressive disorder, recurrent, moderate: Secondary | ICD-10-CM

## 2015-04-05 MED ORDER — PRAZOSIN HCL 2 MG PO CAPS: 2.0000 mg | ORAL_CAPSULE | Freq: Every day | ORAL | Status: DC

## 2015-04-05 MED ORDER — AMPHETAMINE-DEXTROAMPHETAMINE 20 MG PO TABS
20.0000 mg | ORAL_TABLET | Freq: Two times a day (BID) | ORAL | Status: DC
Start: 2015-04-05 — End: 2015-06-14

## 2015-04-05 MED ORDER — DULOXETINE HCL 60 MG PO CPEP: 60.0000 mg | ORAL_CAPSULE | Freq: Two times a day (BID) | ORAL | Status: DC

## 2015-04-05 MED ORDER — AMPHETAMINE-DEXTROAMPHETAMINE 20 MG PO TABS: 20.0000 mg | ORAL_TABLET | Freq: Two times a day (BID) | ORAL | Status: DC
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
## 2015-04-06 DIAGNOSIS — M25571 Pain in right ankle and joints of right foot: Secondary | ICD-10-CM | POA: Diagnosis not present

## 2015-04-06 DIAGNOSIS — M6281 Muscle weakness (generalized): Secondary | ICD-10-CM | POA: Diagnosis not present

## 2015-04-06 DIAGNOSIS — R262 Difficulty in walking, not elsewhere classified: Secondary | ICD-10-CM | POA: Diagnosis not present

## 2015-04-06 DIAGNOSIS — R2681 Unsteadiness on feet: Secondary | ICD-10-CM | POA: Diagnosis not present

## 2015-04-08 DIAGNOSIS — M6281 Muscle weakness (generalized): Secondary | ICD-10-CM | POA: Diagnosis not present

## 2015-04-08 DIAGNOSIS — R2681 Unsteadiness on feet: Secondary | ICD-10-CM | POA: Diagnosis not present

## 2015-04-08 DIAGNOSIS — M25571 Pain in right ankle and joints of right foot: Secondary | ICD-10-CM | POA: Diagnosis not present

## 2015-04-08 DIAGNOSIS — R262 Difficulty in walking, not elsewhere classified: Secondary | ICD-10-CM | POA: Diagnosis not present

## 2015-04-09 DIAGNOSIS — M6281 Muscle weakness (generalized): Secondary | ICD-10-CM | POA: Diagnosis not present

## 2015-04-09 DIAGNOSIS — R2681 Unsteadiness on feet: Secondary | ICD-10-CM | POA: Diagnosis not present

## 2015-04-09 DIAGNOSIS — M25571 Pain in right ankle and joints of right foot: Secondary | ICD-10-CM | POA: Diagnosis not present

## 2015-04-09 DIAGNOSIS — R262 Difficulty in walking, not elsewhere classified: Secondary | ICD-10-CM | POA: Diagnosis not present

## 2015-04-12 DIAGNOSIS — M5416 Radiculopathy, lumbar region: Secondary | ICD-10-CM | POA: Diagnosis not present

## 2015-04-12 DIAGNOSIS — M1288 Other specific arthropathies, not elsewhere classified, other specified site: Secondary | ICD-10-CM | POA: Diagnosis not present

## 2015-04-12 DIAGNOSIS — F1190 Opioid use, unspecified, uncomplicated: Secondary | ICD-10-CM | POA: Diagnosis not present

## 2015-04-12 DIAGNOSIS — Z8673 Personal history of transient ischemic attack (TIA), and cerebral infarction without residual deficits: Secondary | ICD-10-CM | POA: Diagnosis not present

## 2015-04-12 DIAGNOSIS — M797 Fibromyalgia: Secondary | ICD-10-CM | POA: Diagnosis not present

## 2015-04-12 DIAGNOSIS — Z79891 Long term (current) use of opiate analgesic: Secondary | ICD-10-CM | POA: Diagnosis not present

## 2015-04-13 DIAGNOSIS — R262 Difficulty in walking, not elsewhere classified: Secondary | ICD-10-CM | POA: Diagnosis not present

## 2015-04-13 DIAGNOSIS — M25571 Pain in right ankle and joints of right foot: Secondary | ICD-10-CM | POA: Diagnosis not present

## 2015-04-13 DIAGNOSIS — R2681 Unsteadiness on feet: Secondary | ICD-10-CM | POA: Diagnosis not present

## 2015-04-13 DIAGNOSIS — M6281 Muscle weakness (generalized): Secondary | ICD-10-CM | POA: Diagnosis not present

## 2015-04-15 DIAGNOSIS — R262 Difficulty in walking, not elsewhere classified: Secondary | ICD-10-CM | POA: Diagnosis not present

## 2015-04-15 DIAGNOSIS — R2681 Unsteadiness on feet: Secondary | ICD-10-CM | POA: Diagnosis not present

## 2015-04-15 DIAGNOSIS — M25571 Pain in right ankle and joints of right foot: Secondary | ICD-10-CM | POA: Diagnosis not present

## 2015-04-15 DIAGNOSIS — M6281 Muscle weakness (generalized): Secondary | ICD-10-CM | POA: Diagnosis not present

## 2015-04-16 DIAGNOSIS — M25571 Pain in right ankle and joints of right foot: Secondary | ICD-10-CM | POA: Diagnosis not present

## 2015-04-16 DIAGNOSIS — R262 Difficulty in walking, not elsewhere classified: Secondary | ICD-10-CM | POA: Diagnosis not present

## 2015-04-16 DIAGNOSIS — M6281 Muscle weakness (generalized): Secondary | ICD-10-CM | POA: Diagnosis not present

## 2015-04-16 DIAGNOSIS — R2681 Unsteadiness on feet: Secondary | ICD-10-CM | POA: Diagnosis not present

## 2015-04-18 NOTE — Progress Notes (Signed)
No show

## 2015-04-19 ENCOUNTER — Encounter: Payer: Medicare Other | Admitting: Cardiovascular Disease

## 2015-04-19 ENCOUNTER — Encounter: Payer: Self-pay | Admitting: Cardiovascular Disease

## 2015-04-19 ENCOUNTER — Ambulatory Visit (INDEPENDENT_AMBULATORY_CARE_PROVIDER_SITE_OTHER): Payer: Self-pay | Admitting: Internal Medicine

## 2015-04-20 DIAGNOSIS — M6281 Muscle weakness (generalized): Secondary | ICD-10-CM | POA: Diagnosis not present

## 2015-04-20 DIAGNOSIS — M25571 Pain in right ankle and joints of right foot: Secondary | ICD-10-CM | POA: Diagnosis not present

## 2015-04-20 DIAGNOSIS — R2681 Unsteadiness on feet: Secondary | ICD-10-CM | POA: Diagnosis not present

## 2015-04-20 DIAGNOSIS — R262 Difficulty in walking, not elsewhere classified: Secondary | ICD-10-CM | POA: Diagnosis not present

## 2015-04-22 ENCOUNTER — Other Ambulatory Visit (HOSPITAL_COMMUNITY): Payer: Self-pay | Admitting: Pulmonary Disease

## 2015-04-22 DIAGNOSIS — Z1231 Encounter for screening mammogram for malignant neoplasm of breast: Secondary | ICD-10-CM

## 2015-04-23 DIAGNOSIS — R262 Difficulty in walking, not elsewhere classified: Secondary | ICD-10-CM | POA: Diagnosis not present

## 2015-04-23 DIAGNOSIS — M25571 Pain in right ankle and joints of right foot: Secondary | ICD-10-CM | POA: Diagnosis not present

## 2015-04-23 DIAGNOSIS — M6281 Muscle weakness (generalized): Secondary | ICD-10-CM | POA: Diagnosis not present

## 2015-04-23 DIAGNOSIS — R2681 Unsteadiness on feet: Secondary | ICD-10-CM | POA: Diagnosis not present

## 2015-04-26 ENCOUNTER — Encounter: Payer: Self-pay | Admitting: Cardiovascular Disease

## 2015-04-26 ENCOUNTER — Ambulatory Visit (INDEPENDENT_AMBULATORY_CARE_PROVIDER_SITE_OTHER): Payer: Medicare Other | Admitting: Cardiovascular Disease

## 2015-04-26 VITALS — BP 120/70 | HR 94 | Ht 60.0 in | Wt 193.0 lb

## 2015-04-26 DIAGNOSIS — I10 Essential (primary) hypertension: Secondary | ICD-10-CM | POA: Diagnosis not present

## 2015-04-26 NOTE — Progress Notes (Signed)
Patient ID: Lindsay Walsh, female   DOB: September 19, 1970, 44 y.o.   MRN: [id number]    HPI: Lindsay Walsh is a 44 y/o patient we are following for hypertension and palpitations with known history of anxiety, tobacco abuse, and fibromyalgia. She was last seen in the office in December of 2014 with multiple non-cardiac somatic complaints. No further cardiac testing was planned as NM study in 2013 was normal   She comes today with multiple somatic complaints. Chronic neck and back pain, bilateral arm pain, and waking at night short of breath. She sometimes as some chest pressure. She has OSA, but has not been wearing her CPAP because she states it did not fit. She is often tired during the day.   Allergies  Allergen Reactions  . Doxycycline Shortness Of Breath and Rash  . Fentanyl Shortness Of Breath, Itching and Other (See Comments)    Heart racing, SOB  . Ceftin [Cefuroxime Axetil]   . Iohexol Other (See Comments)    Knots on body   . Ivp Dye [Iodinated Diagnostic Agents] Other (See Comments)    Knots on body  . Norvasc [Amlodipine Besylate]   . Wellbutrin [Bupropion Hcl] Other (See Comments)    unknown  . Latex Rash  . Tape Rash    Current Outpatient Prescriptions  Medication Sig Dispense Refill  . amphetamine-dextroamphetamine (ADDERALL) 20 MG tablet Take 1 tablet (20 mg total) by mouth 2 (two) times daily. 60 tablet 0  . amphetamine-dextroamphetamine (ADDERALL) 20 MG tablet Take 1 tablet (20 mg total) by mouth 2 (two) times daily. 60 tablet 0  . Ascorbic Acid (VITAMIN C) 1000 MG tablet Take 1,000 mg by mouth every morning.     Marland Kitchen atorvastatin (LIPITOR) 80 MG tablet Take 1 tablet (80 mg total) by mouth daily at 6 PM. 30 tablet 12  . azaTHIOprine (IMURAN) 50 MG tablet take 1 tablet by mouth once daily 30 tablet 5  . calcium-vitamin D (OS-CAL 500 + D) 500-200 MG-UNIT per tablet Take 1 tablet by mouth 2 (two) times daily.     Marland Kitchen CIPRODEX otic suspension Place 2 drops into the left ear  daily.     Marland Kitchen dicyclomine (BENTYL) 10 MG capsule Take 1 capsule (10 mg total) by mouth daily as needed. For cramps 90 capsule 5  . DULoxetine (CYMBALTA) 60 MG capsule Take 1 capsule (60 mg total) by mouth 2 (two) times daily. 60 capsule 2  . estradiol (CLIMARA - DOSED IN MG/24 HR) 0.075 mg/24hr Place 1 patch onto the skin once a week.      . furosemide (LASIX) 40 MG tablet Take 40 mg by mouth every other day.     . gabapentin (NEURONTIN) 300 MG capsule Take 300 mg by mouth at bedtime as needed (for pain).     Marland Kitchen GARLIC PO Take 9,417 mg by mouth daily.     . hydrALAZINE (APRESOLINE) 50 MG tablet Take 1 tablet (50 mg total) by mouth every 8 (eight) hours. 90 tablet 5  . isosorbide mononitrate (IMDUR) 30 MG 24 hr tablet TAKE ONE (1) TABLET EACH DAY 30 tablet 11  . lidocaine (LIDODERM) 5 % Place 1 patch onto the skin daily as needed (for pain). 30 patch 0  . lisinopril (PRINIVIL,ZESTRIL) 40 MG tablet Take 40 mg by mouth daily.     . methadone (DOLOPHINE) 5 MG tablet Take 5 mg by mouth 4 (four) times daily as needed. For pain    . metoprolol succinate (TOPROL-XL) 25 MG 24  hr tablet take 3 tablet by mouth every morning 90 tablet 6  . Multiple Vitamins-Minerals (CENTRUM) tablet Take 1 tablet by mouth daily.    Marland Kitchen NEXIUM 40 MG capsule take 1 capsule by mouth every morning 30 capsule 5  . nicotine (NICODERM CQ - DOSED IN MG/24 HOURS) 21 mg/24hr patch Place 1 patch (21 mg total) onto the skin daily. 28 patch 0  . pramipexole (MIRAPEX) 0.25 MG tablet Take 0.25 mg by mouth at bedtime as needed. For restless legs    . prazosin (MINIPRESS) 2 MG capsule Take 1 capsule (2 mg total) by mouth at bedtime. 30 capsule 2  . PROAIR HFA 108 (90 BASE) MCG/ACT inhaler Inhale 1-2 puffs into the lungs every 6 (six) hours as needed for wheezing or shortness of breath.     . Probiotic Product (ALIGN PO) Take 1 capsule by mouth daily.      No current facility-administered medications for this visit.    Past Medical History    Diagnosis Date  . Gastroesophageal reflux disease     Chronic abdominal pain; gastroparesis; globus hystericus; irritable bowel syndrome  . Hypertension   . Depression   . Fibromyalgia   . Chest pain     Normal stress echo in 2011; PVCs; pedal edema  . Tobacco abuse     one pack per day; 35 pack years  . Overweight(278.02)   . Narcotic dependence   . Pulmonary nodule   . Fasting hyperglycemia   . Shortness of breath     with exertion  . Sleep apnea   . Autoimmune hepatitis   . Irritable bowel syndrome   . Arthritis     back  . Angina     took SL nitro one week ago  . Dysrhythmia     palpatations  . PTSD (post-traumatic stress disorder)   . Sleep apnea     on CPAP machine  . Dyspareunia 05/06/2014  . CVA (cerebral infarction)     Past Surgical History  Procedure Laterality Date  . Upper gastrointestinal endoscopy  09/13/2010  . Liver biopsy      x4  . Total abdominal hysterectomy w/ bilateral salpingoophorectomy    . Cesarean section      X3  . Tubal ligation      Bilateral  . Abdominal hysterectomy    . Cholecystectomy    . Bladder suspension  09/12/2011    Procedure: TRANSVAGINAL TAPE (TVT) PROCEDURE;  Surgeon: [name], MD;  Location: [facility];  Service: Urology;  Laterality: N/A;  . Lumbar epidural injection  01/2012  . Back surgery    . Esophagogastroduodenoscopy endoscopy      "throat stretched" per pt.    ROS: Review of systems complete and found to be negative unless listed above  BP 120/70 mmHg  Pulse 94  Ht 5' (1.524 m)  Wt 87.544 kg (193 lb)  BMI 37.69 kg/m2  SpO2 97%  PHYSICAL EXAM Affect appropriate Overweight white female  HEENT: normal Neck supple with no adenopathy JVP normal no bruits no thyromegaly Lungs clear with no wheezing and good diaphragmatic motion Heart:  S1/S2 no murmur, no rub, gallop or click PMI normal Abdomen: benighn, BS positve, no tenderness, no AAA no bruit.  No HSM or HJR Distal pulses intact with no  bruits No edema Neuro non-focal Skin warm and dry tatoos on arms  No muscular weakness    EKG:  2015  NSR with non-specific T-wave abnormalities. Rate of  75 bpm.  ASSESSMENT AND PLAN  Chest Pain: Palpitations: resolved likely due to nicotine and adderall with HTN heart disease HTN: improved so long as she is compliant with meds Can probably stop imdur Anxiety: improved continue cymbalta Smoking: quit about 6 weeks ago on 14 mg patch now Relation to smoking and HTN and vascular Disease discussed Fibromyalgia:  On imuran sees [facility] really needs some simplification of her med list  FU with [place] in a year  Jenkins Rouge

## 2015-04-26 NOTE — Patient Instructions (Signed)
Your physician wants you to follow-up in: 1 year with Dr. Johnsie Cancel. You will receive a reminder letter in the mail two months in advance. If you don't receive a letter, please call our office to schedule the follow-up appointment.  Your physician recommends that you continue on your current medications as directed. Please refer to the Current Medication list given to you today.  Thanks for choosing [facility]!!!

## 2015-05-04 ENCOUNTER — Ambulatory Visit (INDEPENDENT_AMBULATORY_CARE_PROVIDER_SITE_OTHER): Payer: Self-pay | Admitting: Internal Medicine

## 2015-05-06 DIAGNOSIS — Z8673 Personal history of transient ischemic attack (TIA), and cerebral infarction without residual deficits: Secondary | ICD-10-CM | POA: Diagnosis not present

## 2015-05-06 DIAGNOSIS — Z23 Encounter for immunization: Secondary | ICD-10-CM | POA: Diagnosis not present

## 2015-05-06 DIAGNOSIS — K754 Autoimmune hepatitis: Secondary | ICD-10-CM | POA: Diagnosis not present

## 2015-05-06 DIAGNOSIS — I10 Essential (primary) hypertension: Secondary | ICD-10-CM | POA: Diagnosis not present

## 2015-05-06 DIAGNOSIS — E669 Obesity, unspecified: Secondary | ICD-10-CM | POA: Diagnosis not present

## 2015-05-07 DIAGNOSIS — M6281 Muscle weakness (generalized): Secondary | ICD-10-CM | POA: Diagnosis not present

## 2015-05-07 DIAGNOSIS — R262 Difficulty in walking, not elsewhere classified: Secondary | ICD-10-CM | POA: Diagnosis not present

## 2015-05-07 DIAGNOSIS — M25571 Pain in right ankle and joints of right foot: Secondary | ICD-10-CM | POA: Diagnosis not present

## 2015-05-07 DIAGNOSIS — R2681 Unsteadiness on feet: Secondary | ICD-10-CM | POA: Diagnosis not present

## 2015-05-10 ENCOUNTER — Ambulatory Visit (HOSPITAL_COMMUNITY)
Admission: RE | Admit: 2015-05-10 | Discharge: 2015-05-10 | Disposition: A | Discharge status: Home / Self Care | Payer: Medicare Other | Source: Ambulatory Visit | Attending: Pulmonary Disease | Admitting: Pulmonary Disease

## 2015-05-10 DIAGNOSIS — Z1231 Encounter for screening mammogram for malignant neoplasm of breast: Secondary | ICD-10-CM | POA: Diagnosis not present

## 2015-05-13 DIAGNOSIS — M25571 Pain in right ankle and joints of right foot: Secondary | ICD-10-CM | POA: Diagnosis not present

## 2015-05-13 DIAGNOSIS — R2681 Unsteadiness on feet: Secondary | ICD-10-CM | POA: Diagnosis not present

## 2015-05-13 DIAGNOSIS — M6281 Muscle weakness (generalized): Secondary | ICD-10-CM | POA: Diagnosis not present

## 2015-05-13 DIAGNOSIS — R262 Difficulty in walking, not elsewhere classified: Secondary | ICD-10-CM | POA: Diagnosis not present

## 2015-05-14 DIAGNOSIS — M25571 Pain in right ankle and joints of right foot: Secondary | ICD-10-CM | POA: Diagnosis not present

## 2015-05-14 DIAGNOSIS — R262 Difficulty in walking, not elsewhere classified: Secondary | ICD-10-CM | POA: Diagnosis not present

## 2015-05-14 DIAGNOSIS — M6281 Muscle weakness (generalized): Secondary | ICD-10-CM | POA: Diagnosis not present

## 2015-05-14 DIAGNOSIS — R2681 Unsteadiness on feet: Secondary | ICD-10-CM | POA: Diagnosis not present

## 2015-05-17 ENCOUNTER — Ambulatory Visit (INDEPENDENT_AMBULATORY_CARE_PROVIDER_SITE_OTHER): Payer: Self-pay | Admitting: Internal Medicine

## 2015-05-19 DIAGNOSIS — M25571 Pain in right ankle and joints of right foot: Secondary | ICD-10-CM | POA: Diagnosis not present

## 2015-05-19 DIAGNOSIS — R2681 Unsteadiness on feet: Secondary | ICD-10-CM | POA: Diagnosis not present

## 2015-05-19 DIAGNOSIS — M6281 Muscle weakness (generalized): Secondary | ICD-10-CM | POA: Diagnosis not present

## 2015-05-19 DIAGNOSIS — R262 Difficulty in walking, not elsewhere classified: Secondary | ICD-10-CM | POA: Diagnosis not present

## 2015-05-20 DIAGNOSIS — M6281 Muscle weakness (generalized): Secondary | ICD-10-CM | POA: Diagnosis not present

## 2015-05-20 DIAGNOSIS — R262 Difficulty in walking, not elsewhere classified: Secondary | ICD-10-CM | POA: Diagnosis not present

## 2015-05-20 DIAGNOSIS — M25571 Pain in right ankle and joints of right foot: Secondary | ICD-10-CM | POA: Diagnosis not present

## 2015-05-20 DIAGNOSIS — R2681 Unsteadiness on feet: Secondary | ICD-10-CM | POA: Diagnosis not present

## 2015-05-24 ENCOUNTER — Encounter (INDEPENDENT_AMBULATORY_CARE_PROVIDER_SITE_OTHER): Payer: Self-pay | Admitting: *Deleted

## 2015-05-26 DIAGNOSIS — M25571 Pain in right ankle and joints of right foot: Secondary | ICD-10-CM | POA: Diagnosis not present

## 2015-05-26 DIAGNOSIS — M6281 Muscle weakness (generalized): Secondary | ICD-10-CM | POA: Diagnosis not present

## 2015-05-26 DIAGNOSIS — R2681 Unsteadiness on feet: Secondary | ICD-10-CM | POA: Diagnosis not present

## 2015-05-26 DIAGNOSIS — R262 Difficulty in walking, not elsewhere classified: Secondary | ICD-10-CM | POA: Diagnosis not present

## 2015-05-27 DIAGNOSIS — R262 Difficulty in walking, not elsewhere classified: Secondary | ICD-10-CM | POA: Diagnosis not present

## 2015-05-27 DIAGNOSIS — R2681 Unsteadiness on feet: Secondary | ICD-10-CM | POA: Diagnosis not present

## 2015-05-27 DIAGNOSIS — M6281 Muscle weakness (generalized): Secondary | ICD-10-CM | POA: Diagnosis not present

## 2015-05-27 DIAGNOSIS — M25571 Pain in right ankle and joints of right foot: Secondary | ICD-10-CM | POA: Diagnosis not present

## 2015-06-08 ENCOUNTER — Other Ambulatory Visit (INDEPENDENT_AMBULATORY_CARE_PROVIDER_SITE_OTHER): Payer: Self-pay | Admitting: Internal Medicine

## 2015-06-14 ENCOUNTER — Ambulatory Visit (INDEPENDENT_AMBULATORY_CARE_PROVIDER_SITE_OTHER): Payer: Medicare Other | Admitting: Psychiatry

## 2015-06-14 ENCOUNTER — Encounter (HOSPITAL_COMMUNITY): Payer: Self-pay | Admitting: Psychiatry

## 2015-06-14 VITALS — BP 135/72 | HR 70 | Ht 60.0 in | Wt 197.0 lb

## 2015-06-14 DIAGNOSIS — F4310 Post-traumatic stress disorder, unspecified: Secondary | ICD-10-CM

## 2015-06-14 DIAGNOSIS — F329 Major depressive disorder, single episode, unspecified: Secondary | ICD-10-CM | POA: Diagnosis not present

## 2015-06-14 DIAGNOSIS — F515 Nightmare disorder: Principal | ICD-10-CM

## 2015-06-14 DIAGNOSIS — F4312 Post-traumatic stress disorder, chronic: Secondary | ICD-10-CM

## 2015-06-14 MED ORDER — DULOXETINE HCL 60 MG PO CPEP: 60.0000 mg | ORAL_CAPSULE | Freq: Two times a day (BID) | ORAL | Status: DC

## 2015-06-14 MED ORDER — AMPHETAMINE-DEXTROAMPHETAMINE 20 MG PO TABS: 20.0000 mg | ORAL_TABLET | Freq: Two times a day (BID) | ORAL | Status: DC

## 2015-06-14 MED ORDER — PRAZOSIN HCL 2 MG PO CAPS: 2.0000 mg | ORAL_CAPSULE | Freq: Every day | ORAL | Status: DC
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
## 2015-07-08 DIAGNOSIS — M1288 Other specific arthropathies, not elsewhere classified, other specified site: Secondary | ICD-10-CM | POA: Diagnosis not present

## 2015-07-08 DIAGNOSIS — M797 Fibromyalgia: Secondary | ICD-10-CM | POA: Diagnosis not present

## 2015-07-08 DIAGNOSIS — Z79891 Long term (current) use of opiate analgesic: Secondary | ICD-10-CM | POA: Diagnosis not present

## 2015-08-12 ENCOUNTER — Telehealth (HOSPITAL_COMMUNITY): Payer: Self-pay | Admitting: *Deleted

## 2015-08-24 ENCOUNTER — Ambulatory Visit (INDEPENDENT_AMBULATORY_CARE_PROVIDER_SITE_OTHER): Payer: Self-pay | Admitting: Internal Medicine

## 2015-08-24 ENCOUNTER — Encounter (INDEPENDENT_AMBULATORY_CARE_PROVIDER_SITE_OTHER): Payer: Self-pay | Admitting: Internal Medicine

## 2015-09-01 DIAGNOSIS — R42 Dizziness and giddiness: Secondary | ICD-10-CM | POA: Diagnosis not present

## 2015-09-01 DIAGNOSIS — M79606 Pain in leg, unspecified: Secondary | ICD-10-CM | POA: Diagnosis not present

## 2015-09-01 DIAGNOSIS — M549 Dorsalgia, unspecified: Secondary | ICD-10-CM | POA: Diagnosis not present

## 2015-09-01 DIAGNOSIS — I69998 Other sequelae following unspecified cerebrovascular disease: Secondary | ICD-10-CM | POA: Diagnosis not present

## 2015-09-01 DIAGNOSIS — M79603 Pain in arm, unspecified: Secondary | ICD-10-CM | POA: Diagnosis not present

## 2015-09-01 DIAGNOSIS — E1142 Type 2 diabetes mellitus with diabetic polyneuropathy: Secondary | ICD-10-CM | POA: Diagnosis not present

## 2015-09-10 ENCOUNTER — Ambulatory Visit (HOSPITAL_COMMUNITY): Payer: Self-pay | Admitting: Psychiatry

## 2015-09-16 DIAGNOSIS — K254 Chronic or unspecified gastric ulcer with hemorrhage: Secondary | ICD-10-CM | POA: Diagnosis not present

## 2015-09-16 DIAGNOSIS — Z8673 Personal history of transient ischemic attack (TIA), and cerebral infarction without residual deficits: Secondary | ICD-10-CM | POA: Diagnosis not present

## 2015-09-16 DIAGNOSIS — I10 Essential (primary) hypertension: Secondary | ICD-10-CM | POA: Diagnosis not present

## 2015-09-24 ENCOUNTER — Other Ambulatory Visit (HOSPITAL_COMMUNITY): Payer: Self-pay | Admitting: Psychiatry

## 2015-09-24 ENCOUNTER — Telehealth (HOSPITAL_COMMUNITY): Payer: Self-pay | Admitting: *Deleted

## 2015-09-24 MED ORDER — AMPHETAMINE-DEXTROAMPHETAMINE 20 MG PO TABS: 20.0000 mg | ORAL_TABLET | Freq: Two times a day (BID) | ORAL | Status: DC

## 2015-09-24 NOTE — Telephone Encounter (Signed)
Pt called stating she just took her last tablet of her Adderall today. Per pt, is it possible if Dr. Harrington Challenger could write another script for her to last her until her next appt. Pt last appt was 06-15-2015. Pt medication was last printed on 06-14-15 and 3 scripts was printed at that time. Pt number is [phone].

## 2015-09-24 NOTE — Telephone Encounter (Signed)
Printed. She will need appt

## 2015-09-27 ENCOUNTER — Encounter (HOSPITAL_COMMUNITY): Payer: Self-pay | Admitting: *Deleted

## 2015-09-27 NOTE — Telephone Encounter (Signed)
noted 

## 2015-09-27 NOTE — Telephone Encounter (Signed)
Pt is aware and shows understanding 

## 2015-09-27 NOTE — Telephone Encounter (Signed)
lmtcb number provided 

## 2015-09-27 NOTE — Progress Notes (Signed)
Pt came into office to pick up printed script for her Adderall 20 mg. Pt D/L number is [id number] with expiration date of 02-06-2018. Pt agreed and showed understanding.

## 2015-09-29 DIAGNOSIS — I10 Essential (primary) hypertension: Secondary | ICD-10-CM | POA: Diagnosis not present

## 2015-09-30 DIAGNOSIS — M1288 Other specific arthropathies, not elsewhere classified, other specified site: Secondary | ICD-10-CM | POA: Diagnosis not present

## 2015-09-30 DIAGNOSIS — M797 Fibromyalgia: Secondary | ICD-10-CM | POA: Diagnosis not present

## 2015-09-30 DIAGNOSIS — Z79891 Long term (current) use of opiate analgesic: Secondary | ICD-10-CM | POA: Diagnosis not present

## 2015-09-30 DIAGNOSIS — M791 Myalgia: Secondary | ICD-10-CM | POA: Diagnosis not present

## 2015-10-01 ENCOUNTER — Telehealth (HOSPITAL_COMMUNITY): Payer: Self-pay | Admitting: *Deleted

## 2015-10-01 ENCOUNTER — Other Ambulatory Visit (HOSPITAL_COMMUNITY): Payer: Self-pay | Admitting: Psychiatry

## 2015-10-01 DIAGNOSIS — F515 Nightmare disorder: Principal | ICD-10-CM

## 2015-10-01 DIAGNOSIS — F4310 Post-traumatic stress disorder, unspecified: Secondary | ICD-10-CM

## 2015-10-01 MED ORDER — PRAZOSIN HCL 2 MG PO CAPS: 2.0000 mg | ORAL_CAPSULE | Freq: Every day | ORAL | Status: DC

## 2015-10-01 NOTE — Telephone Encounter (Signed)
Pt pharmacy [facility] requesting refills for pt Prazosin 2 mg QHS. Pt medication last filled 06-14-15. Pt f/u appt is scheduled for 10-06-15. Pharmacy number is [phone].

## 2015-10-01 NOTE — Telephone Encounter (Signed)
done

## 2015-10-01 NOTE — Telephone Encounter (Signed)
noted 

## 2015-10-04 ENCOUNTER — Encounter (INDEPENDENT_AMBULATORY_CARE_PROVIDER_SITE_OTHER): Payer: Self-pay | Admitting: *Deleted

## 2015-10-04 ENCOUNTER — Encounter (INDEPENDENT_AMBULATORY_CARE_PROVIDER_SITE_OTHER): Payer: Self-pay | Admitting: Internal Medicine

## 2015-10-04 ENCOUNTER — Ambulatory Visit (INDEPENDENT_AMBULATORY_CARE_PROVIDER_SITE_OTHER): Payer: Medicare Other | Admitting: Internal Medicine

## 2015-10-04 VITALS — BP 134/100 | HR 64 | Temp 98.1°F | Ht 60.0 in | Wt 201.9 lb

## 2015-10-04 DIAGNOSIS — K754 Autoimmune hepatitis: Secondary | ICD-10-CM | POA: Diagnosis not present

## 2015-10-04 NOTE — Patient Instructions (Signed)
Labs and US. OV in 6 months.  

## 2015-10-04 NOTE — Progress Notes (Signed)
Subjective:    Patient ID: Lindsay Walsh, female    DOB: 07/02/1971, 45 y.o.   MRN: [id number]  HPIHere today for f/u of her autoimmune hepatitis. She was last seen in March of 2016.  She was diagnosed greater than 13 yrs Recently had a CVA August of 2016. She does not have any deficits.she had left sided weakness. Her appetite is good. No weight loss. BMs once a day. No melena or BRRB. She exercises walking the dog.  She is followed at [facility] also for her autoimmune hepatitis.  GERD is controlled. She Dicyclomine as needed for IBS. CBC    Component Value Date/Time   WBC 8.2 03/05/2015 1432   RBC 3.96 03/05/2015 1432   HGB 12.3 03/05/2015 1432   HCT 35.4* 03/05/2015 1432   PLT 230 03/05/2015 1432   MCV 89.4 03/05/2015 1432   MCH 31.1 03/05/2015 1432   MCHC 34.7 03/05/2015 1432   RDW 12.2 03/05/2015 1432   LYMPHSABS 2.3 03/05/2015 1432   MONOABS 0.7 03/05/2015 1432   EOSABS 0.1 03/05/2015 1432   BASOSABS 0.0 03/05/2015 1432    Hepatic Function Latest Ref Rng 03/05/2015 10/06/2014 09/29/2013  Total Protein 6.5 - 8.1 g/dL 6.8 7.1 6.6  Albumin 3.5 - 5.0 g/dL 3.6 4.0 3.9  AST 15 - 41 U/L _0 ALT 14 - 54 U/L _1 Alk Phosphatase 38 - 126 U/L 81 84 85  Total Bilirubin 0.3 - 1.2 mg/dL 0.4 0.4 0.3  Bilirubin, Direct 0.0 - 0.3 mg/dL - 0.1 0.1        Review of Systems Past Medical History  Diagnosis Date  . Gastroesophageal reflux disease     Chronic abdominal pain; gastroparesis; globus hystericus; irritable bowel syndrome  . Hypertension   . Depression   . Fibromyalgia   . Chest pain     Normal stress echo in 2011; PVCs; pedal edema  . Tobacco abuse     one pack per day; 35 pack years  . Overweight(278.02)   . Narcotic dependence ([place])   . Pulmonary nodule   . Fasting hyperglycemia   . Shortness of breath     with exertion  . Sleep apnea   . Autoimmune hepatitis ([place])   . Irritable bowel syndrome   . Arthritis     back  . Angina     took SL nitro  one week ago  . Dysrhythmia     palpatations  . PTSD (post-traumatic stress disorder)   . Sleep apnea     on CPAP machine  . Dyspareunia 05/06/2014  . CVA (cerebral infarction)     Past Surgical History  Procedure Laterality Date  . Upper gastrointestinal endoscopy  09/13/2010  . Liver biopsy      x4  . Total abdominal hysterectomy w/ bilateral salpingoophorectomy    . Cesarean section      X3  . Tubal ligation      Bilateral  . Abdominal hysterectomy    . Cholecystectomy    . Bladder suspension  09/12/2011    Procedure: TRANSVAGINAL TAPE (TVT) PROCEDURE;  Surgeon: [name], MD;  Location: [facility];  Service: Urology;  Laterality: N/A;  . Lumbar epidural injection  01/2012  . Back surgery    . Esophagogastroduodenoscopy endoscopy      "throat stretched" per pt.    Allergies  Allergen Reactions  . Doxycycline Shortness Of Breath and Rash  . Fentanyl Shortness Of Breath, Itching and Other (See  Comments)    Heart racing, SOB  . Ceftin [Cefuroxime Axetil]   . Iohexol Other (See Comments)    Knots on body   . Ivp Dye [Iodinated Diagnostic Agents] Other (See Comments)    Knots on body  . Norvasc [Amlodipine Besylate]   . Wellbutrin [Bupropion Hcl] Other (See Comments)    unknown  . Latex Rash  . Tape Rash    Current Outpatient Prescriptions on File Prior to Visit  Medication Sig Dispense Refill  . amphetamine-dextroamphetamine (ADDERALL) 20 MG tablet Take 1 tablet (20 mg total) by mouth 2 (two) times daily. 60 tablet 0  . Ascorbic Acid (VITAMIN C) 1000 MG tablet Take 1,000 mg by mouth every morning.     Marland Kitchen atorvastatin (LIPITOR) 80 MG tablet Take 1 tablet (80 mg total) by mouth daily at 6 PM. 30 tablet 12  . azaTHIOprine (IMURAN) 50 MG tablet take 1 tablet by mouth once daily 30 tablet 5  . calcium-vitamin D (OS-CAL 500 + D) 500-200 MG-UNIT per tablet Take 1 tablet by mouth 2 (two) times daily.     Marland Kitchen CIPRODEX otic suspension Place 2 drops into the left ear as  needed. Reported on 10/04/2015    . dicyclomine (BENTYL) 10 MG capsule Take 1 capsule (10 mg total) by mouth daily as needed. For cramps 90 capsule 5  . DULoxetine (CYMBALTA) 60 MG capsule Take 1 capsule (60 mg total) by mouth 2 (two) times daily. 60 capsule 2  . estradiol (CLIMARA - DOSED IN MG/24 HR) 0.075 mg/24hr Place 1 patch onto the skin once a week.      . furosemide (LASIX) 40 MG tablet Take 40 mg by mouth every other day.     . gabapentin (NEURONTIN) 300 MG capsule Take 300 mg by mouth at bedtime as needed (for pain).     Marland Kitchen GARLIC PO Take 5,597 mg by mouth daily.     . hydrALAZINE (APRESOLINE) 50 MG tablet Take 1 tablet (50 mg total) by mouth every 8 (eight) hours. 90 tablet 5  . isosorbide mononitrate (IMDUR) 30 MG 24 hr tablet TAKE ONE (1) TABLET EACH DAY 30 tablet 11  . lidocaine (LIDODERM) 5 % Place 1 patch onto the skin daily as needed (for pain). 30 patch 0  . lisinopril (PRINIVIL,ZESTRIL) 40 MG tablet Take 40 mg by mouth daily.     . methadone (DOLOPHINE) 5 MG tablet Take 5 mg by mouth 4 (four) times daily as needed. For pain    . metoprolol succinate (TOPROL-XL) 25 MG 24 hr tablet take 3 tablet by mouth every morning 90 tablet 6  . Multiple Vitamins-Minerals (CENTRUM) tablet Take 1 tablet by mouth daily.    Marland Kitchen NEXIUM 40 MG capsule take 1 capsule by mouth every morning 30 capsule 5  . pramipexole (MIRAPEX) 0.25 MG tablet Take 0.25 mg by mouth at bedtime as needed. For restless legs    . prazosin (MINIPRESS) 2 MG capsule Take 1 capsule (2 mg total) by mouth at bedtime. 30 capsule 2  . PROAIR HFA 108 (90 BASE) MCG/ACT inhaler Inhale 1-2 puffs into the lungs every 6 (six) hours as needed for wheezing or shortness of breath.      No current facility-administered medications on file prior to visit.        Objective:   Physical Exam Blood pressure 134/100, pulse 64, temperature 98.1 F (36.7 C), height 5' (1.524 m), weight 201 lb 14.4 oz (91.581 kg). Alert and oriented. Skin warm  and  dry. Oral mucosa is moist.   . Sclera anicteric, conjunctivae is pink. Thyroid not enlarged. No cervical lymphadenopathy. Lungs clear. Heart regular rate and rhythm.  Abdomen is soft. Bowel sounds are positive. No hepatomegaly. No abdominal masses felt. No tenderness.  No edema to lower extremities.         Assessment & Plan:   Autoimmune hepatitis. Patien seems to be in biochemical remission. Disease was diagnosed about 13 years ago.  CBC, Hepatic function, and sed rate. US abdomen OV in 6 months.

## 2015-10-05 LAB — CBC WITH DIFFERENTIAL/PLATELET
Basophils Absolute: 0 10*3/uL (ref 0.0–0.1)
Basophils Relative: 0 % (ref 0–1)
EOS PCT: 2 % (ref 0–5)
Eosinophils Absolute: 0.2 10*3/uL (ref 0.0–0.7)
HEMATOCRIT: 37.9 % (ref 36.0–46.0)
Hemoglobin: 12.8 g/dL (ref 12.0–15.0)
LYMPHS PCT: 31 % (ref 12–46)
Lymphs Abs: 2.6 10*3/uL (ref 0.7–4.0)
MCH: 30.5 pg (ref 26.0–34.0)
MCHC: 33.8 g/dL (ref 30.0–36.0)
MCV: 90.2 fL (ref 78.0–100.0)
MONO ABS: 0.8 10*3/uL (ref 0.1–1.0)
MPV: 10.5 fL (ref 8.6–12.4)
Monocytes Relative: 9 % (ref 3–12)
Neutro Abs: 4.9 10*3/uL (ref 1.7–7.7)
Neutrophils Relative %: 58 % (ref 43–77)
Platelets: 280 10*3/uL (ref 150–400)
RBC: 4.2 MIL/uL (ref 3.87–5.11)
RDW: 13.4 % (ref 11.5–15.5)
WBC: 8.5 10*3/uL (ref 4.0–10.5)

## 2015-10-05 LAB — HEPATIC FUNCTION PANEL
ALK PHOS: 100 U/L (ref 33–115)
ALT: 16 U/L (ref 6–29)
AST: 21 U/L (ref 10–35)
Albumin: 3.8 g/dL (ref 3.6–5.1)
BILIRUBIN INDIRECT: 0.3 mg/dL (ref 0.2–1.2)
Bilirubin, Direct: 0.1 mg/dL (ref <=0.2)
TOTAL PROTEIN: 7.3 g/dL (ref 6.1–8.1)
Total Bilirubin: 0.4 mg/dL (ref 0.2–1.2)

## 2015-10-05 LAB — SEDIMENTATION RATE: Sed Rate: 20 mm/hr (ref 0–20)

## 2015-10-06 ENCOUNTER — Ambulatory Visit (INDEPENDENT_AMBULATORY_CARE_PROVIDER_SITE_OTHER): Payer: Medicare Other | Admitting: Psychiatry

## 2015-10-06 ENCOUNTER — Encounter (HOSPITAL_COMMUNITY): Payer: Self-pay | Admitting: Psychiatry

## 2015-10-06 VITALS — BP 143/90 | HR 95 | Ht 60.0 in | Wt 201.0 lb

## 2015-10-06 DIAGNOSIS — F4310 Post-traumatic stress disorder, unspecified: Secondary | ICD-10-CM

## 2015-10-06 DIAGNOSIS — F515 Nightmare disorder: Secondary | ICD-10-CM | POA: Diagnosis not present

## 2015-10-06 DIAGNOSIS — F331 Major depressive disorder, recurrent, moderate: Secondary | ICD-10-CM | POA: Diagnosis not present

## 2015-10-06 MED ORDER — AMPHETAMINE-DEXTROAMPHETAMINE 20 MG PO TABS: 20.0000 mg | ORAL_TABLET | Freq: Two times a day (BID) | ORAL | Status: DC

## 2015-10-06 MED ORDER — DULOXETINE HCL 60 MG PO CPEP: 60.0000 mg | ORAL_CAPSULE | Freq: Two times a day (BID) | ORAL | Status: DC

## 2015-10-06 MED ORDER — PRAZOSIN HCL 2 MG PO CAPS: 2.0000 mg | ORAL_CAPSULE | Freq: Every day | ORAL | Status: DC
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
## 2015-10-07 ENCOUNTER — Ambulatory Visit (HOSPITAL_COMMUNITY)
Admission: RE | Admit: 2015-10-07 | Discharge: 2015-10-07 | Disposition: A | Discharge status: Home / Self Care | Payer: Medicare Other | Source: Ambulatory Visit | Attending: Internal Medicine | Admitting: Internal Medicine

## 2015-10-07 DIAGNOSIS — K754 Autoimmune hepatitis: Secondary | ICD-10-CM | POA: Insufficient documentation

## 2015-11-04 ENCOUNTER — Other Ambulatory Visit (INDEPENDENT_AMBULATORY_CARE_PROVIDER_SITE_OTHER): Payer: Self-pay | Admitting: Internal Medicine

## 2015-11-04 ENCOUNTER — Telehealth (INDEPENDENT_AMBULATORY_CARE_PROVIDER_SITE_OTHER): Payer: Self-pay | Admitting: Internal Medicine

## 2015-11-04 NOTE — Telephone Encounter (Signed)
Patient called, stated that she is still looking for her Nexium refill.  She would really like a call back today. [phone]

## 2015-11-05 ENCOUNTER — Telehealth (INDEPENDENT_AMBULATORY_CARE_PROVIDER_SITE_OTHER): Payer: Self-pay | Admitting: Internal Medicine

## 2015-11-05 NOTE — Telephone Encounter (Signed)
Message left on answering machine. This was sent in yesterday to her pharmacy.  Hope, patients should call their pharmacy so the pharmacy can send a request.

## 2015-11-08 NOTE — Telephone Encounter (Signed)
error 

## 2015-11-11 DIAGNOSIS — K754 Autoimmune hepatitis: Secondary | ICD-10-CM | POA: Diagnosis not present

## 2015-11-13 ENCOUNTER — Other Ambulatory Visit (INDEPENDENT_AMBULATORY_CARE_PROVIDER_SITE_OTHER): Payer: Self-pay | Admitting: Internal Medicine

## 2015-12-27 DIAGNOSIS — I10 Essential (primary) hypertension: Secondary | ICD-10-CM | POA: Diagnosis not present

## 2015-12-27 DIAGNOSIS — I69998 Other sequelae following unspecified cerebrovascular disease: Secondary | ICD-10-CM | POA: Diagnosis not present

## 2015-12-27 DIAGNOSIS — M79606 Pain in leg, unspecified: Secondary | ICD-10-CM | POA: Diagnosis not present

## 2015-12-27 DIAGNOSIS — G4089 Other seizures: Secondary | ICD-10-CM | POA: Diagnosis not present

## 2015-12-31 DIAGNOSIS — G8929 Other chronic pain: Secondary | ICD-10-CM | POA: Diagnosis not present

## 2015-12-31 DIAGNOSIS — M797 Fibromyalgia: Secondary | ICD-10-CM | POA: Diagnosis not present

## 2015-12-31 DIAGNOSIS — M79673 Pain in unspecified foot: Secondary | ICD-10-CM | POA: Diagnosis not present

## 2015-12-31 DIAGNOSIS — Z79891 Long term (current) use of opiate analgesic: Secondary | ICD-10-CM | POA: Diagnosis not present

## 2015-12-31 DIAGNOSIS — M5416 Radiculopathy, lumbar region: Secondary | ICD-10-CM | POA: Diagnosis not present

## 2016-01-06 ENCOUNTER — Ambulatory Visit (INDEPENDENT_AMBULATORY_CARE_PROVIDER_SITE_OTHER): Payer: Medicare Other | Admitting: Psychiatry

## 2016-01-06 ENCOUNTER — Encounter (HOSPITAL_COMMUNITY): Payer: Self-pay | Admitting: Psychiatry

## 2016-01-06 VITALS — BP 126/84 | HR 64 | Ht 60.0 in | Wt 207.2 lb

## 2016-01-06 DIAGNOSIS — F4310 Post-traumatic stress disorder, unspecified: Secondary | ICD-10-CM | POA: Diagnosis not present

## 2016-01-06 DIAGNOSIS — R569 Unspecified convulsions: Secondary | ICD-10-CM | POA: Diagnosis not present

## 2016-01-06 DIAGNOSIS — F331 Major depressive disorder, recurrent, moderate: Secondary | ICD-10-CM | POA: Diagnosis not present

## 2016-01-06 DIAGNOSIS — F4312 Post-traumatic stress disorder, chronic: Secondary | ICD-10-CM

## 2016-01-06 DIAGNOSIS — F515 Nightmare disorder: Secondary | ICD-10-CM | POA: Diagnosis not present

## 2016-01-06 MED ORDER — AMPHETAMINE-DEXTROAMPHETAMINE 20 MG PO TABS: 20.0000 mg | ORAL_TABLET | Freq: Two times a day (BID) | ORAL | Status: DC

## 2016-01-06 MED ORDER — PRAZOSIN HCL 2 MG PO CAPS: 2.0000 mg | ORAL_CAPSULE | Freq: Every day | ORAL | Status: DC

## 2016-01-06 MED ORDER — DULOXETINE HCL 60 MG PO CPEP: 60.0000 mg | ORAL_CAPSULE | Freq: Two times a day (BID) | ORAL | Status: DC
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
## 2016-02-22 DIAGNOSIS — I10 Essential (primary) hypertension: Secondary | ICD-10-CM | POA: Diagnosis not present

## 2016-02-22 DIAGNOSIS — I69998 Other sequelae following unspecified cerebrovascular disease: Secondary | ICD-10-CM | POA: Diagnosis not present

## 2016-02-22 DIAGNOSIS — M5416 Radiculopathy, lumbar region: Secondary | ICD-10-CM | POA: Diagnosis not present

## 2016-02-22 DIAGNOSIS — G5603 Carpal tunnel syndrome, bilateral upper limbs: Secondary | ICD-10-CM | POA: Diagnosis not present

## 2016-02-22 DIAGNOSIS — G4733 Obstructive sleep apnea (adult) (pediatric): Secondary | ICD-10-CM | POA: Diagnosis not present

## 2016-03-14 ENCOUNTER — Other Ambulatory Visit (HOSPITAL_COMMUNITY): Payer: Self-pay | Admitting: Psychiatry

## 2016-03-28 DIAGNOSIS — M797 Fibromyalgia: Secondary | ICD-10-CM | POA: Diagnosis not present

## 2016-03-28 DIAGNOSIS — M5416 Radiculopathy, lumbar region: Secondary | ICD-10-CM | POA: Diagnosis not present

## 2016-03-28 DIAGNOSIS — Z79891 Long term (current) use of opiate analgesic: Secondary | ICD-10-CM | POA: Diagnosis not present

## 2016-04-04 ENCOUNTER — Encounter (INDEPENDENT_AMBULATORY_CARE_PROVIDER_SITE_OTHER): Payer: Self-pay | Admitting: Internal Medicine

## 2016-04-04 ENCOUNTER — Ambulatory Visit (INDEPENDENT_AMBULATORY_CARE_PROVIDER_SITE_OTHER): Payer: Medicare Other | Admitting: Internal Medicine

## 2016-04-04 VITALS — BP 98/60 | HR 80 | Temp 98.3°F | Ht 60.0 in | Wt 209.3 lb

## 2016-04-04 DIAGNOSIS — K754 Autoimmune hepatitis: Secondary | ICD-10-CM

## 2016-04-04 LAB — CBC WITH DIFFERENTIAL/PLATELET
BASOS ABS: 0 {cells}/uL (ref 0–200)
BASOS PCT: 0 %
EOS ABS: 94 {cells}/uL (ref 15–500)
Eosinophils Relative: 1 %
HEMATOCRIT: 36.3 % (ref 35.0–45.0)
Hemoglobin: 12.2 g/dL (ref 11.7–15.5)
LYMPHS PCT: 23 %
Lymphs Abs: 2162 cells/uL (ref 850–3900)
MCH: 30.4 pg (ref 27.0–33.0)
MCHC: 33.6 g/dL (ref 32.0–36.0)
MCV: 90.5 fL (ref 80.0–100.0)
MONO ABS: 752 {cells}/uL (ref 200–950)
MONOS PCT: 8 %
MPV: 10.4 fL (ref 7.5–12.5)
NEUTROS PCT: 68 %
Neutro Abs: 6392 cells/uL (ref 1500–7800)
PLATELETS: 262 10*3/uL (ref 140–400)
RBC: 4.01 MIL/uL (ref 3.80–5.10)
RDW: 13 % (ref 11.0–15.0)
WBC: 9.4 10*3/uL (ref 3.8–10.8)

## 2016-04-04 NOTE — Progress Notes (Signed)
Subjective:    Patient ID: Lindsay Walsh, female    DOB: 12-Jan-1971, 45 y.o.   MRN: [id number]  HPI Here today for f/u of her auto immune hepatitis. She was last seen in March of this year. She was diagnosed greater than 13 yrs ago. Hx of CVA in August of 2016. No deficits. Liver enzymes were normal in March of this year. CBC normal.  Maintained on Imuran; She quit smoking a year ago. She has gained about 8 pounds. Her appetite is good. She usually has a BM daily. No melena or BRRB. GERD controlled with therapy.   10/07/2015 US abdomen complete: auto immune hepatitis   IMPRESSION: Gallbladder absent.  Liver echogenicity is diffusely increased, likely indicative of hepatic steatosis. A degree of underlying parenchymal liver disease may also be present. While no focal liver lesions are identified, it must be cautioned that the sensitivity of ultrasound for focal liver lesions is diminished in this circumstance.  Portions of the pancreas are obscured by gas. The visualized portions of the pancreas appear normal.  Study otherwise unremarkable.    HPIHere today for f/u of her autoimmune hepatitis. She was last seen in March of 2016.  She was diagnosed greater than 13 yrs Recently had a CVA August of 2016. She does not have any deficits.she had left sided weakness. Her appetite is good. No weight loss. BMs once a day. No melena or BRRB. She exercises walking the dog.  She is [facility] at [facility] also for her autoimmune hepatitis.  GERD is controlled. She Dicyclomine as needed for IBS.   CBC    Component Value Date/Time   WBC 8.5 10/04/2015 1621   RBC 4.20 10/04/2015 1621   HGB 12.8 10/04/2015 1621   HCT 37.9 10/04/2015 1621   PLT 280 10/04/2015 1621   MCV 90.2 10/04/2015 1621   MCH 30.5 10/04/2015 1621   MCHC 33.8 10/04/2015 1621   RDW 13.4 10/04/2015 1621   LYMPHSABS 2.6 10/04/2015 1621   MONOABS 0.8 10/04/2015 1621   EOSABS 0.2 10/04/2015 1621   BASOSABS 0.0  10/04/2015 1621   Hepatic Function Latest Ref Rng & Units 10/04/2015 03/05/2015 10/06/2014  Total Protein 6.1 - 8.1 g/dL 7.3 6.8 7.1  Albumin 3.6 - 5.1 g/dL 3.8 3.6 4.0  AST 10 - 35 U/L 21 21 22   ALT 6 - 29 U/L 16 17 21   Alk Phosphatase 33 - 115 U/L 100 81 84  Total Bilirubin 0.2 - 1.2 mg/dL 0.4 0.4 0.4  Bilirubin, Direct <=0.2 mg/dL 0.1 - 0.1      Review of Systems Past Medical History:  Diagnosis Date  . Angina    took SL nitro one week ago  . Arthritis    back  . Autoimmune hepatitis ([place])   . Chest pain    Normal stress echo in 2011; PVCs; pedal edema  . CVA (cerebral infarction)   . Depression   . Dyspareunia 05/06/2014  . Dysrhythmia    palpatations  . Fasting hyperglycemia   . Fibromyalgia   . Gastroesophageal reflux disease    Chronic abdominal pain; gastroparesis; globus hystericus; irritable bowel syndrome  . Hypertension   . Irritable bowel syndrome   . Narcotic dependence ([place])   . Overweight(278.02)   . PTSD (post-traumatic stress disorder)   . Pulmonary nodule   . Shortness of breath    with exertion  . Sleep apnea   . Sleep apnea    on CPAP machine  . Tobacco abuse  one pack per day; 35 pack years    Past Surgical History:  Procedure Laterality Date  . ABDOMINAL HYSTERECTOMY    . BACK SURGERY    . BLADDER SUSPENSION  09/12/2011   Procedure: TRANSVAGINAL TAPE (TVT) PROCEDURE;  Surgeon: [name], MD;  Location: [facility];  Service: Urology;  Laterality: N/A;  . CESAREAN SECTION     X3  . CHOLECYSTECTOMY    . ESOPHAGOGASTRODUODENOSCOPY ENDOSCOPY     "throat stretched" per pt.  Marland Kitchen LIVER BIOPSY     x4  . LUMBAR EPIDURAL INJECTION  01/2012  . TOTAL ABDOMINAL HYSTERECTOMY W/ BILATERAL SALPINGOOPHORECTOMY    . TUBAL LIGATION     Bilateral  . UPPER GASTROINTESTINAL ENDOSCOPY  09/13/2010    Allergies  Allergen Reactions  . Doxycycline Shortness Of Breath and Rash  . Fentanyl Shortness Of Breath, Itching and Other (See Comments)    Heart  racing, SOB  . Ceftin [Cefuroxime Axetil]   . Iohexol Other (See Comments)    Knots on body   . Ivp Dye [Iodinated Diagnostic Agents] Other (See Comments)    Knots on body  . Norvasc [Amlodipine Besylate]   . Wellbutrin [Bupropion Hcl] Other (See Comments)    unknown  . Latex Rash  . Tape Rash    Current Outpatient Prescriptions on File Prior to Visit  Medication Sig Dispense Refill  . amphetamine-dextroamphetamine (ADDERALL) 20 MG tablet Take 1 tablet (20 mg total) by mouth 2 (two) times daily. 60 tablet 0  . Ascorbic Acid (VITAMIN C) 1000 MG tablet Take 1,000 mg by mouth every morning.     Marland Kitchen aspirin 325 MG EC tablet Take 325 mg by mouth daily.    Marland Kitchen azaTHIOprine (IMURAN) 50 MG tablet take 1 tablet by mouth once daily 30 tablet 5  . calcium-vitamin D (OS-CAL 500 + D) 500-200 MG-UNIT per tablet Take 1 tablet by mouth 2 (two) times daily.     Marland Kitchen CIPRODEX otic suspension Place 2 drops into the left ear as needed. Reported on 10/04/2015    . dicyclomine (BENTYL) 10 MG capsule Take 1 capsule (10 mg total) by mouth daily as needed. For cramps 90 capsule 5  . DULoxetine (CYMBALTA) 60 MG capsule Take 1 capsule (60 mg total) by mouth 2 (two) times daily. 60 capsule 2  . esomeprazole (NEXIUM) 40 MG capsule take 1 capsule by mouth every morning 90 capsule 3  . estradiol (CLIMARA - DOSED IN MG/24 HR) 0.075 mg/24hr Place 1 patch onto the skin once a week. Reported on 10/06/2015    . furosemide (LASIX) 40 MG tablet Take 40 mg by mouth every other day.     . gabapentin (NEURONTIN) 300 MG capsule Take 300 mg by mouth at bedtime as needed (for pain).     Marland Kitchen GARLIC PO Take 5,852 mg by mouth daily.     . hydrALAZINE (APRESOLINE) 50 MG tablet Take 1 tablet (50 mg total) by mouth every 8 (eight) hours. 90 tablet 5  . isosorbide mononitrate (IMDUR) 30 MG 24 hr tablet TAKE ONE (1) TABLET EACH DAY 30 tablet 11  . lidocaine (LIDODERM) 5 % Place 1 patch onto the skin daily as needed (for pain). 30 patch 0  .  lisinopril (PRINIVIL,ZESTRIL) 40 MG tablet Take 40 mg by mouth daily.     . methadone (DOLOPHINE) 5 MG tablet Take 5 mg by mouth 4 (four) times daily as needed. For pain    . metoprolol succinate (TOPROL-XL) 25 MG 24 hr  tablet take 3 tablet by mouth every morning 90 tablet 6  . Multiple Vitamins-Minerals (CENTRUM) tablet Take 1 tablet by mouth daily.    . pramipexole (MIRAPEX) 0.25 MG tablet Take 0.25 mg by mouth at bedtime as needed. For restless legs    . prazosin (MINIPRESS) 2 MG capsule Take 1 capsule (2 mg total) by mouth at bedtime. 30 capsule 2  . PROAIR HFA 108 (90 BASE) MCG/ACT inhaler Inhale 1-2 puffs into the lungs every 6 (six) hours as needed for wheezing or shortness of breath.     . Probiotic Product (RESTORA PO) Take by mouth.    Marland Kitchen atorvastatin (LIPITOR) 10 MG tablet Take 10 mg by mouth daily.     No current facility-administered medications on file prior to visit.        Objective:   Physical Exam Blood pressure 98/60, pulse 80, temperature 98.3 F (36.8 C), height 5' (1.524 m), weight 209 lb 4.8 oz (94.9 kg). Alert and oriented. Skin warm and dry. Oral mucosa is moist.   . Sclera anicteric, conjunctivae is pink. Thyroid not enlarged. No cervical lymphadenopathy. Lungs clear. Heart regular rate and rhythm.  Abdomen is soft. Bowel sounds are positive. No hepatomegaly. No abdominal masses felt. No tenderness.  No edema to lower extremities.          Assessment & Plan:  Auto immune hepatitis. She seems to be doing well. Maintained on Imuran.  Will get a Cmet, and CBC today. GERD controlled with therapy. OV in 6 months.

## 2016-04-04 NOTE — Patient Instructions (Signed)
Cbc, and cmet OV in 6 months.

## 2016-04-05 ENCOUNTER — Ambulatory Visit (INDEPENDENT_AMBULATORY_CARE_PROVIDER_SITE_OTHER): Payer: Medicare Other | Admitting: Psychiatry

## 2016-04-05 ENCOUNTER — Encounter (HOSPITAL_COMMUNITY): Payer: Self-pay | Admitting: Psychiatry

## 2016-04-05 VITALS — BP 142/91 | HR 81 | Ht 60.0 in | Wt 208.8 lb

## 2016-04-05 DIAGNOSIS — F4310 Post-traumatic stress disorder, unspecified: Secondary | ICD-10-CM

## 2016-04-05 DIAGNOSIS — F515 Nightmare disorder: Secondary | ICD-10-CM | POA: Diagnosis not present

## 2016-04-05 DIAGNOSIS — F331 Major depressive disorder, recurrent, moderate: Secondary | ICD-10-CM | POA: Diagnosis not present

## 2016-04-05 LAB — COMPREHENSIVE METABOLIC PANEL
ALK PHOS: 102 U/L (ref 33–115)
ALT: 17 U/L (ref 6–29)
AST: 19 U/L (ref 10–35)
Albumin: 3.8 g/dL (ref 3.6–5.1)
BUN: 7 mg/dL (ref 7–25)
CALCIUM: 8.8 mg/dL (ref 8.6–10.2)
CHLORIDE: 103 mmol/L (ref 98–110)
CO2: 25 mmol/L (ref 20–31)
Creat: 0.85 mg/dL (ref 0.50–1.10)
GLUCOSE: 130 mg/dL — AB (ref 65–99)
POTASSIUM: 4.3 mmol/L (ref 3.5–5.3)
Sodium: 142 mmol/L (ref 135–146)
Total Bilirubin: 0.3 mg/dL (ref 0.2–1.2)
Total Protein: 6.8 g/dL (ref 6.1–8.1)

## 2016-04-05 MED ORDER — AMPHETAMINE-DEXTROAMPHETAMINE 20 MG PO TABS: 20.0000 mg | ORAL_TABLET | Freq: Two times a day (BID) | ORAL | 0 refills | Status: DC

## 2016-04-05 MED ORDER — DULOXETINE HCL 60 MG PO CPEP: 60.0000 mg | ORAL_CAPSULE | Freq: Two times a day (BID) | ORAL | 2 refills | Status: DC

## 2016-04-05 MED ORDER — PRAZOSIN HCL 2 MG PO CAPS: 2.0000 mg | ORAL_CAPSULE | Freq: Every day | ORAL | 2 refills | Status: DC
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
## 2016-05-03 ENCOUNTER — Other Ambulatory Visit (HOSPITAL_COMMUNITY): Payer: Self-pay | Admitting: Pulmonary Disease

## 2016-05-03 DIAGNOSIS — Z1231 Encounter for screening mammogram for malignant neoplasm of breast: Secondary | ICD-10-CM

## 2016-05-17 ENCOUNTER — Ambulatory Visit (HOSPITAL_COMMUNITY): Payer: Self-pay

## 2016-06-12 ENCOUNTER — Other Ambulatory Visit (HOSPITAL_COMMUNITY): Payer: Self-pay | Admitting: Psychiatry

## 2016-06-12 ENCOUNTER — Other Ambulatory Visit (INDEPENDENT_AMBULATORY_CARE_PROVIDER_SITE_OTHER): Payer: Self-pay | Admitting: Internal Medicine

## 2016-06-19 ENCOUNTER — Encounter (HOSPITAL_COMMUNITY): Payer: Self-pay

## 2016-06-19 ENCOUNTER — Ambulatory Visit (HOSPITAL_COMMUNITY): Payer: Medicare Other | Admitting: Psychiatry

## 2016-06-26 ENCOUNTER — Telehealth: Payer: Self-pay | Admitting: Cardiovascular Disease

## 2016-06-26 NOTE — Telephone Encounter (Signed)
Roselie from [facility] is calling to see if we received a provider Query faxed on Dec 14 ref:[phone]

## 2016-06-26 NOTE — Telephone Encounter (Signed)
Paperwork is in office. It is regarding office visit 04/26/15.  Will forward to provider's nurse to follow up on next time provider is in office.

## 2016-07-12 ENCOUNTER — Other Ambulatory Visit (HOSPITAL_COMMUNITY): Payer: Self-pay | Admitting: Psychiatry

## 2016-07-17 ENCOUNTER — Telehealth: Payer: Self-pay | Admitting: Cardiovascular Disease

## 2016-07-17 NOTE — Telephone Encounter (Signed)
Will send to Medical Records 

## 2016-07-17 NOTE — Telephone Encounter (Signed)
°  Follow Up   Calling to follow up fax sent on 06/22/16 regarding patients chart. Calling to verify fax was received. Please call.

## 2016-08-07 DIAGNOSIS — M9902 Segmental and somatic dysfunction of thoracic region: Secondary | ICD-10-CM | POA: Diagnosis not present

## 2016-08-07 DIAGNOSIS — M542 Cervicalgia: Secondary | ICD-10-CM | POA: Diagnosis not present

## 2016-08-07 DIAGNOSIS — M9901 Segmental and somatic dysfunction of cervical region: Secondary | ICD-10-CM | POA: Diagnosis not present

## 2016-08-07 DIAGNOSIS — M9903 Segmental and somatic dysfunction of lumbar region: Secondary | ICD-10-CM | POA: Diagnosis not present

## 2016-08-07 DIAGNOSIS — M545 Low back pain: Secondary | ICD-10-CM | POA: Diagnosis not present

## 2016-08-11 DIAGNOSIS — M542 Cervicalgia: Secondary | ICD-10-CM | POA: Diagnosis not present

## 2016-08-11 DIAGNOSIS — M9903 Segmental and somatic dysfunction of lumbar region: Secondary | ICD-10-CM | POA: Diagnosis not present

## 2016-08-11 DIAGNOSIS — M9902 Segmental and somatic dysfunction of thoracic region: Secondary | ICD-10-CM | POA: Diagnosis not present

## 2016-08-11 DIAGNOSIS — M545 Low back pain: Secondary | ICD-10-CM | POA: Diagnosis not present

## 2016-08-11 DIAGNOSIS — M9901 Segmental and somatic dysfunction of cervical region: Secondary | ICD-10-CM | POA: Diagnosis not present

## 2016-08-14 ENCOUNTER — Ambulatory Visit (HOSPITAL_COMMUNITY): Payer: Self-pay

## 2016-08-14 DIAGNOSIS — M542 Cervicalgia: Secondary | ICD-10-CM | POA: Diagnosis not present

## 2016-08-14 DIAGNOSIS — M9902 Segmental and somatic dysfunction of thoracic region: Secondary | ICD-10-CM | POA: Diagnosis not present

## 2016-08-14 DIAGNOSIS — M9903 Segmental and somatic dysfunction of lumbar region: Secondary | ICD-10-CM | POA: Diagnosis not present

## 2016-08-14 DIAGNOSIS — M9901 Segmental and somatic dysfunction of cervical region: Secondary | ICD-10-CM | POA: Diagnosis not present

## 2016-08-14 DIAGNOSIS — M545 Low back pain: Secondary | ICD-10-CM | POA: Diagnosis not present

## 2016-08-15 ENCOUNTER — Ambulatory Visit (INDEPENDENT_AMBULATORY_CARE_PROVIDER_SITE_OTHER): Payer: Medicare Other | Admitting: Psychiatry

## 2016-08-15 ENCOUNTER — Encounter (HOSPITAL_COMMUNITY): Payer: Self-pay | Admitting: Psychiatry

## 2016-08-15 VITALS — BP 151/114 | HR 103 | Ht 60.0 in | Wt 204.6 lb

## 2016-08-15 DIAGNOSIS — F4310 Post-traumatic stress disorder, unspecified: Secondary | ICD-10-CM | POA: Diagnosis not present

## 2016-08-15 DIAGNOSIS — Z9851 Tubal ligation status: Secondary | ICD-10-CM

## 2016-08-15 DIAGNOSIS — Z9889 Other specified postprocedural states: Secondary | ICD-10-CM

## 2016-08-15 DIAGNOSIS — Z9049 Acquired absence of other specified parts of digestive tract: Secondary | ICD-10-CM

## 2016-08-15 DIAGNOSIS — Z9109 Other allergy status, other than to drugs and biological substances: Secondary | ICD-10-CM

## 2016-08-15 DIAGNOSIS — F331 Major depressive disorder, recurrent, moderate: Secondary | ICD-10-CM | POA: Diagnosis not present

## 2016-08-15 DIAGNOSIS — Z888 Allergy status to other drugs, medicaments and biological substances status: Secondary | ICD-10-CM | POA: Diagnosis not present

## 2016-08-15 DIAGNOSIS — Z91040 Latex allergy status: Secondary | ICD-10-CM

## 2016-08-15 DIAGNOSIS — Z90710 Acquired absence of both cervix and uterus: Secondary | ICD-10-CM

## 2016-08-15 DIAGNOSIS — F515 Nightmare disorder: Secondary | ICD-10-CM

## 2016-08-15 MED ORDER — AMPHETAMINE-DEXTROAMPHETAMINE 20 MG PO TABS: 20.0000 mg | ORAL_TABLET | Freq: Two times a day (BID) | ORAL | 0 refills | Status: DC

## 2016-08-15 MED ORDER — PRAZOSIN HCL 2 MG PO CAPS: 2.0000 mg | ORAL_CAPSULE | Freq: Every day | ORAL | 2 refills | Status: DC

## 2016-08-15 MED ORDER — DULOXETINE HCL 60 MG PO CPEP: 60.0000 mg | ORAL_CAPSULE | Freq: Two times a day (BID) | ORAL | 2 refills | Status: DC
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
## 2016-08-18 DIAGNOSIS — M9902 Segmental and somatic dysfunction of thoracic region: Secondary | ICD-10-CM | POA: Diagnosis not present

## 2016-08-18 DIAGNOSIS — M545 Low back pain: Secondary | ICD-10-CM | POA: Diagnosis not present

## 2016-08-18 DIAGNOSIS — M9901 Segmental and somatic dysfunction of cervical region: Secondary | ICD-10-CM | POA: Diagnosis not present

## 2016-08-18 DIAGNOSIS — M9903 Segmental and somatic dysfunction of lumbar region: Secondary | ICD-10-CM | POA: Diagnosis not present

## 2016-08-18 DIAGNOSIS — M542 Cervicalgia: Secondary | ICD-10-CM | POA: Diagnosis not present

## 2016-08-21 DIAGNOSIS — Z79891 Long term (current) use of opiate analgesic: Secondary | ICD-10-CM | POA: Diagnosis not present

## 2016-08-21 DIAGNOSIS — M5416 Radiculopathy, lumbar region: Secondary | ICD-10-CM | POA: Diagnosis not present

## 2016-08-21 DIAGNOSIS — M797 Fibromyalgia: Secondary | ICD-10-CM | POA: Diagnosis not present

## 2016-08-22 DIAGNOSIS — M9901 Segmental and somatic dysfunction of cervical region: Secondary | ICD-10-CM | POA: Diagnosis not present

## 2016-08-22 DIAGNOSIS — M9902 Segmental and somatic dysfunction of thoracic region: Secondary | ICD-10-CM | POA: Diagnosis not present

## 2016-08-22 DIAGNOSIS — M542 Cervicalgia: Secondary | ICD-10-CM | POA: Diagnosis not present

## 2016-08-22 DIAGNOSIS — M9903 Segmental and somatic dysfunction of lumbar region: Secondary | ICD-10-CM | POA: Diagnosis not present

## 2016-08-22 DIAGNOSIS — M545 Low back pain: Secondary | ICD-10-CM | POA: Diagnosis not present

## 2016-08-24 DIAGNOSIS — M545 Low back pain: Secondary | ICD-10-CM | POA: Diagnosis not present

## 2016-08-24 DIAGNOSIS — M9902 Segmental and somatic dysfunction of thoracic region: Secondary | ICD-10-CM | POA: Diagnosis not present

## 2016-08-24 DIAGNOSIS — M542 Cervicalgia: Secondary | ICD-10-CM | POA: Diagnosis not present

## 2016-08-24 DIAGNOSIS — M9903 Segmental and somatic dysfunction of lumbar region: Secondary | ICD-10-CM | POA: Diagnosis not present

## 2016-08-24 DIAGNOSIS — M9901 Segmental and somatic dysfunction of cervical region: Secondary | ICD-10-CM | POA: Diagnosis not present

## 2016-08-28 DIAGNOSIS — J340 Abscess, furuncle and carbuncle of nose: Secondary | ICD-10-CM | POA: Diagnosis not present

## 2016-08-28 DIAGNOSIS — K651 Peritoneal abscess: Secondary | ICD-10-CM | POA: Diagnosis not present

## 2016-08-28 DIAGNOSIS — I10 Essential (primary) hypertension: Secondary | ICD-10-CM | POA: Diagnosis not present

## 2016-08-30 DIAGNOSIS — M9903 Segmental and somatic dysfunction of lumbar region: Secondary | ICD-10-CM | POA: Diagnosis not present

## 2016-08-30 DIAGNOSIS — M542 Cervicalgia: Secondary | ICD-10-CM | POA: Diagnosis not present

## 2016-08-30 DIAGNOSIS — M545 Low back pain: Secondary | ICD-10-CM | POA: Diagnosis not present

## 2016-08-30 DIAGNOSIS — M9901 Segmental and somatic dysfunction of cervical region: Secondary | ICD-10-CM | POA: Diagnosis not present

## 2016-08-30 DIAGNOSIS — M9902 Segmental and somatic dysfunction of thoracic region: Secondary | ICD-10-CM | POA: Diagnosis not present

## 2016-09-28 IMAGING — MR MR MRA HEAD W/O CM
1 series · 14 of 48 positions shown · non-contrast
Comparison: MRI head 03/05/2015

CLINICAL DATA: Stroke

EXAM:
MRA HEAD WITHOUT CONTRAST
TECHNIQUE: Angiographic images of the Circle of Willis were obtained using MRA
technique without intravenous contrast.

[Series 3: MRA · axial · 0.6mm · 0.29mm/px · z∈[-60,+54]mm · 14 of 200 slices shown]
[im 1/200]
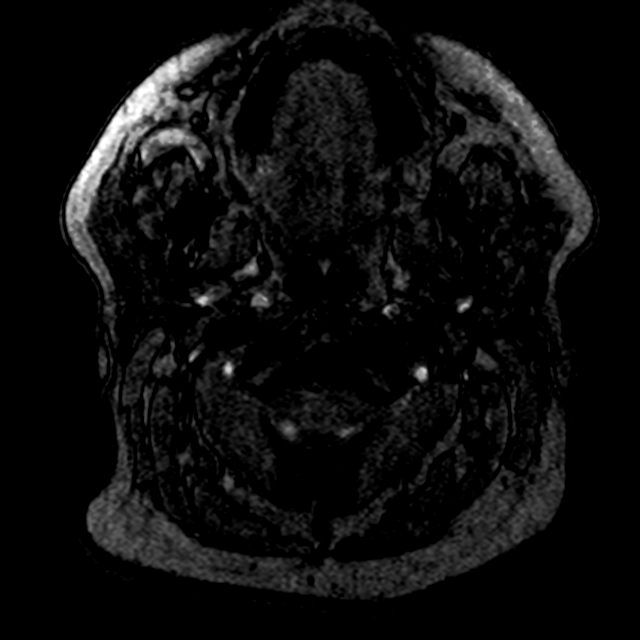
[im 5/200]
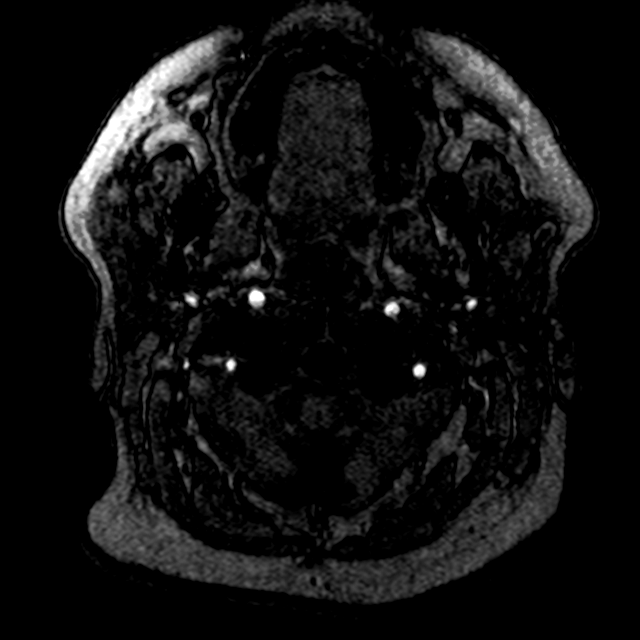
[im 9/200]
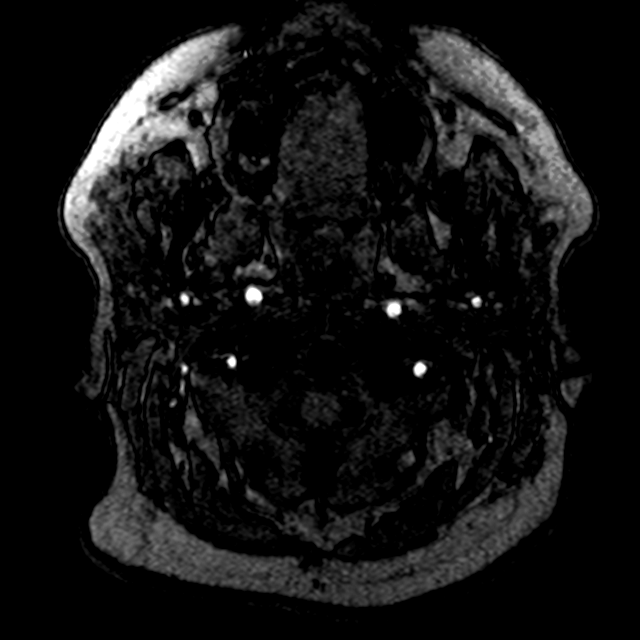
[im 13/200]
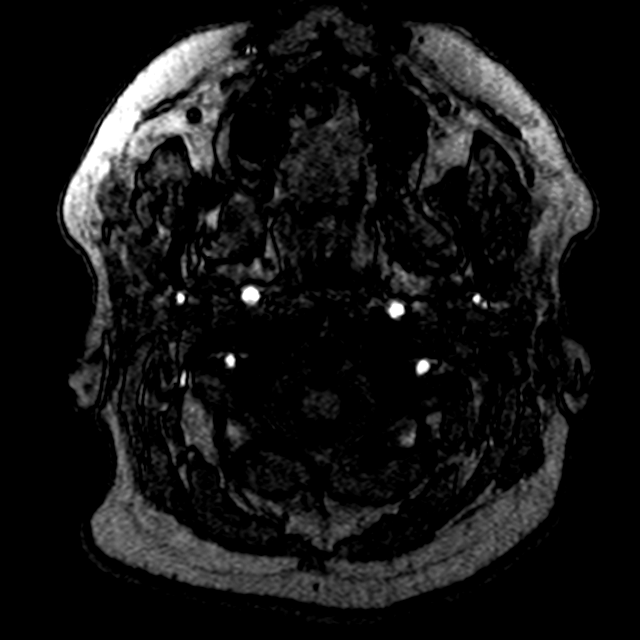
[im 34/200]
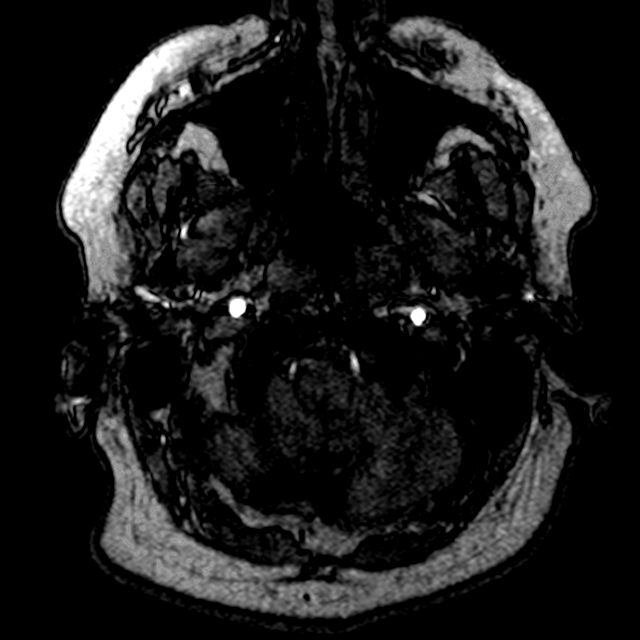
[im 39/200]
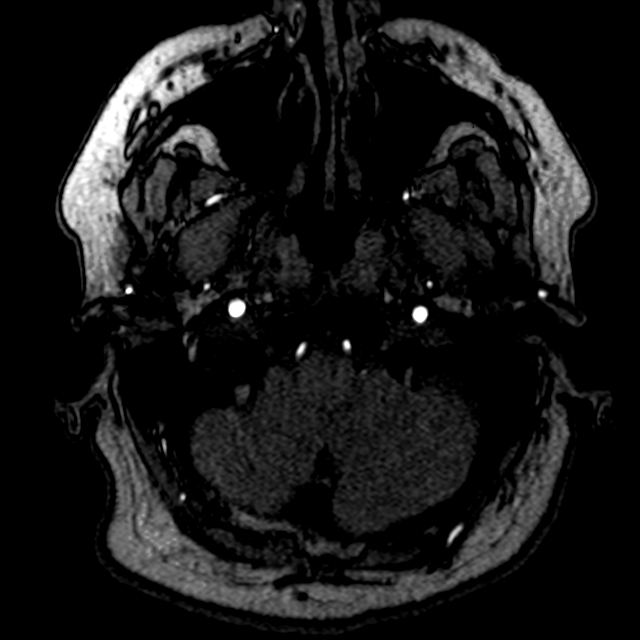
[im 64/200]
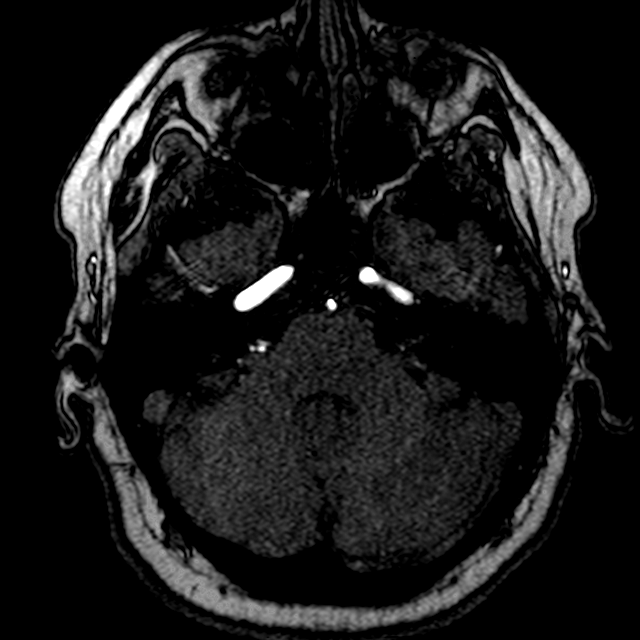
[im 89/200]
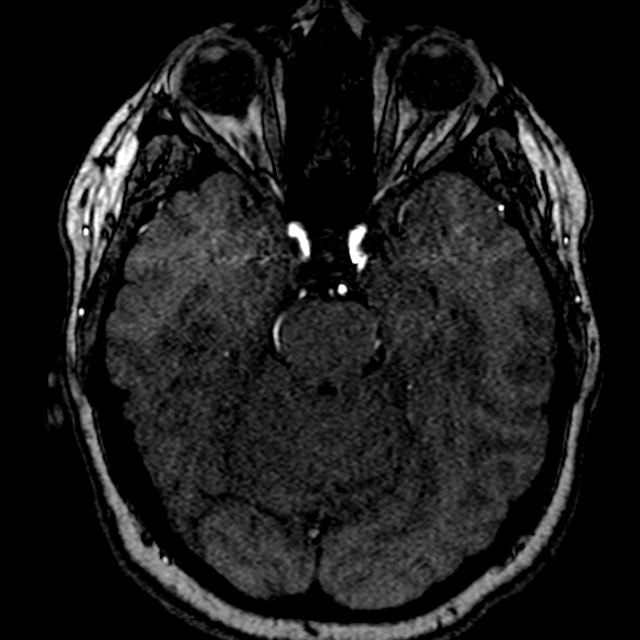
[im 102/200]
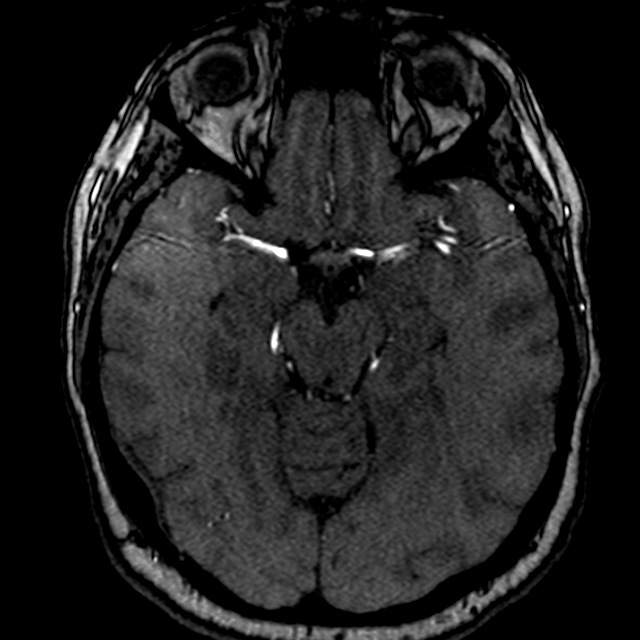
[im 115/200]
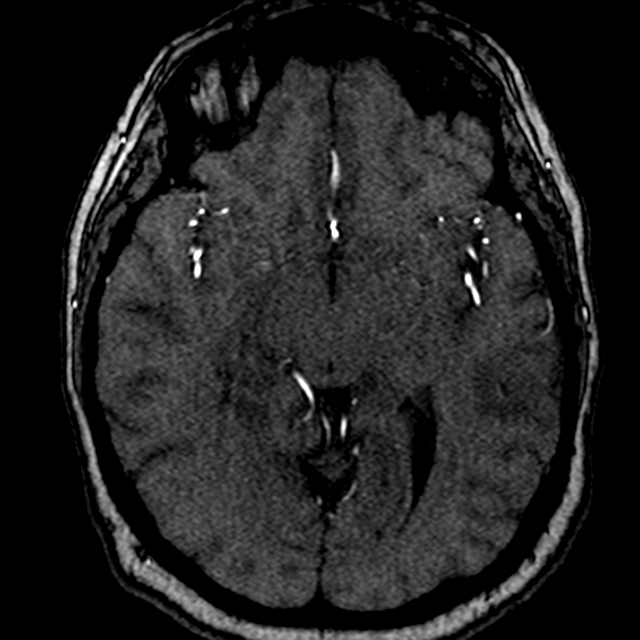
[im 140/200]
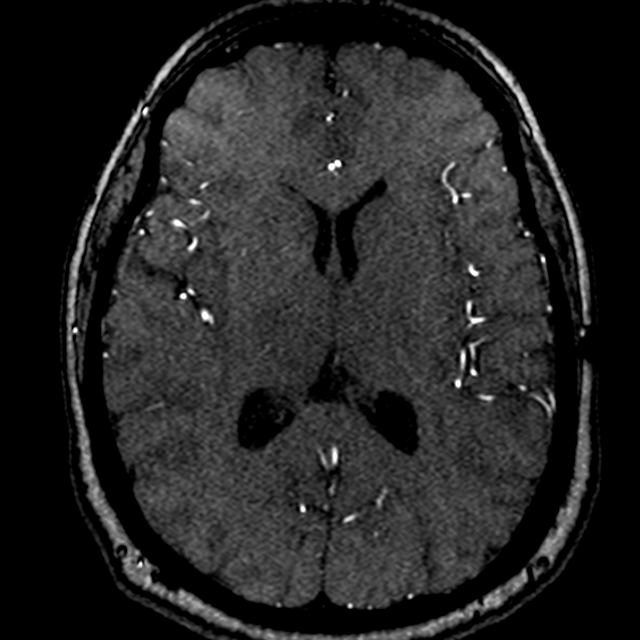
[im 166/200]
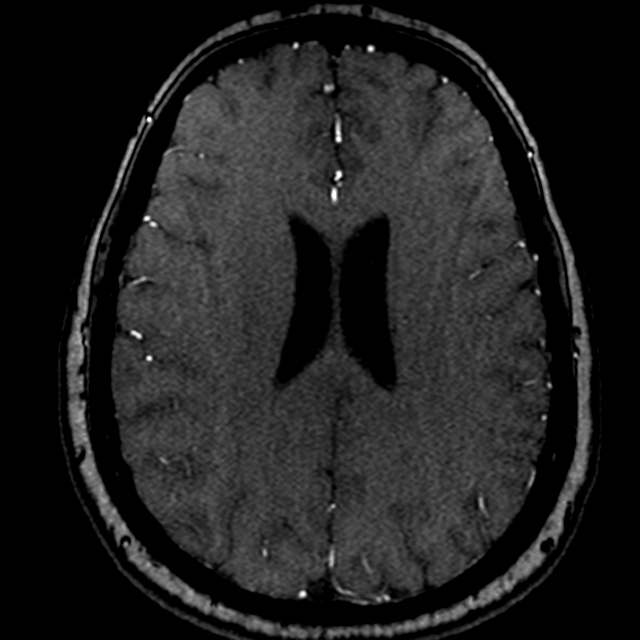
[im 170/200]
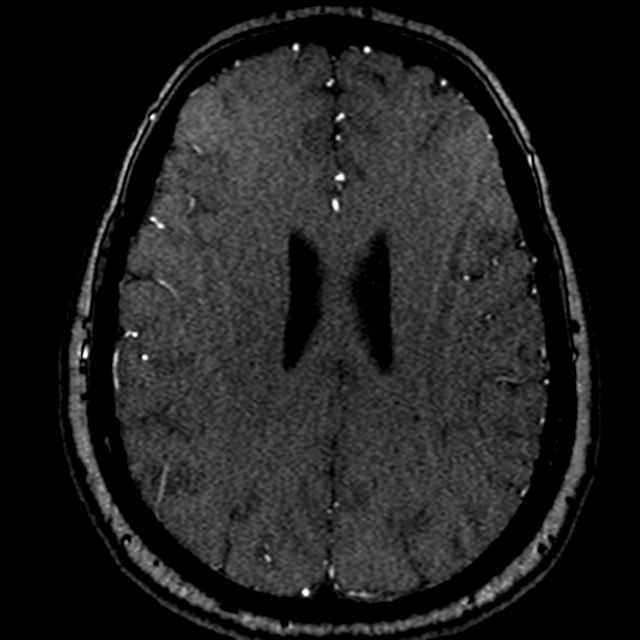
[im 191/200]
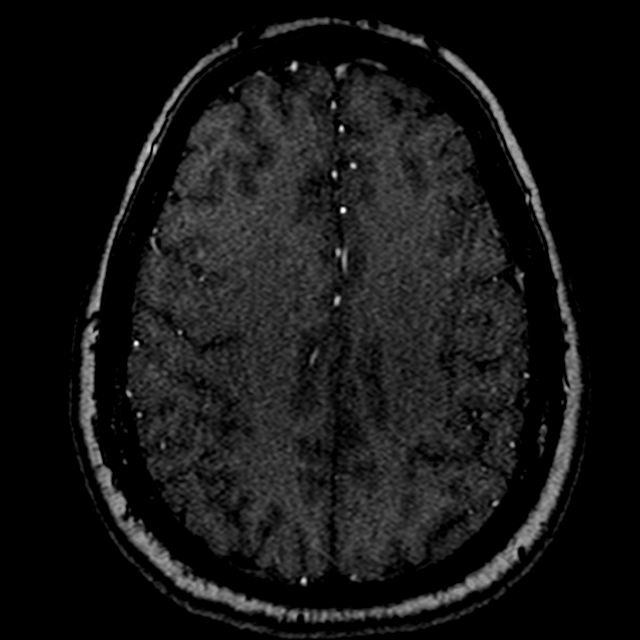

[14 of 48 positions shown; findings below may reference images not displayed]

FINDINGS: Both vertebral arteries are patent to the basilar. AICA patent
bilaterally. PICA not visualized. Basilar widely patent. Superior
cerebellar and posterior cerebral arteries patent bilaterally. Fetal
origin right posterior cerebral artery with hypoplastic right P1
segment.

Internal carotid artery widely patent bilaterally. Anterior and
middle cerebral arteries widely patent bilaterally

Negative for cerebral aneurysm.
IMPRESSION: Negative

## 2016-10-02 ENCOUNTER — Ambulatory Visit (INDEPENDENT_AMBULATORY_CARE_PROVIDER_SITE_OTHER): Payer: Self-pay | Admitting: Internal Medicine

## 2016-10-04 DIAGNOSIS — M797 Fibromyalgia: Secondary | ICD-10-CM | POA: Diagnosis not present

## 2016-10-04 DIAGNOSIS — Z5181 Encounter for therapeutic drug level monitoring: Secondary | ICD-10-CM | POA: Diagnosis not present

## 2016-10-04 DIAGNOSIS — G629 Polyneuropathy, unspecified: Secondary | ICD-10-CM | POA: Diagnosis not present

## 2016-10-04 DIAGNOSIS — M5416 Radiculopathy, lumbar region: Secondary | ICD-10-CM | POA: Diagnosis not present

## 2016-10-31 DIAGNOSIS — M79673 Pain in unspecified foot: Secondary | ICD-10-CM | POA: Diagnosis not present

## 2016-10-31 DIAGNOSIS — Z79891 Long term (current) use of opiate analgesic: Secondary | ICD-10-CM | POA: Diagnosis not present

## 2016-10-31 DIAGNOSIS — M797 Fibromyalgia: Secondary | ICD-10-CM | POA: Diagnosis not present

## 2016-10-31 DIAGNOSIS — G629 Polyneuropathy, unspecified: Secondary | ICD-10-CM | POA: Diagnosis not present

## 2016-10-31 DIAGNOSIS — G8929 Other chronic pain: Secondary | ICD-10-CM | POA: Diagnosis not present

## 2016-10-31 DIAGNOSIS — Z5181 Encounter for therapeutic drug level monitoring: Secondary | ICD-10-CM | POA: Diagnosis not present

## 2016-11-10 ENCOUNTER — Ambulatory Visit (HOSPITAL_COMMUNITY): Payer: Self-pay | Admitting: Psychiatry

## 2016-11-10 DIAGNOSIS — K745 Biliary cirrhosis, unspecified: Secondary | ICD-10-CM | POA: Diagnosis not present

## 2016-11-10 DIAGNOSIS — Z9225 Personal history of immunosupression therapy: Secondary | ICD-10-CM | POA: Diagnosis not present

## 2016-11-10 DIAGNOSIS — K754 Autoimmune hepatitis: Secondary | ICD-10-CM | POA: Diagnosis not present

## 2016-11-13 ENCOUNTER — Ambulatory Visit (HOSPITAL_COMMUNITY): Payer: Self-pay | Admitting: Psychiatry

## 2016-11-23 ENCOUNTER — Other Ambulatory Visit (INDEPENDENT_AMBULATORY_CARE_PROVIDER_SITE_OTHER): Payer: Self-pay | Admitting: Internal Medicine

## 2016-11-29 ENCOUNTER — Encounter (HOSPITAL_COMMUNITY): Payer: Self-pay

## 2016-11-29 ENCOUNTER — Ambulatory Visit (HOSPITAL_COMMUNITY): Payer: Self-pay | Admitting: Psychiatry

## 2016-11-30 ENCOUNTER — Encounter (HOSPITAL_COMMUNITY): Payer: Self-pay | Admitting: Psychiatry

## 2016-11-30 ENCOUNTER — Ambulatory Visit (INDEPENDENT_AMBULATORY_CARE_PROVIDER_SITE_OTHER): Payer: Medicare Other | Admitting: Psychiatry

## 2016-11-30 VITALS — BP 128/80 | HR 93 | Ht 60.0 in | Wt 202.4 lb

## 2016-11-30 DIAGNOSIS — F515 Nightmare disorder: Secondary | ICD-10-CM

## 2016-11-30 DIAGNOSIS — F331 Major depressive disorder, recurrent, moderate: Secondary | ICD-10-CM

## 2016-11-30 DIAGNOSIS — F4312 Post-traumatic stress disorder, chronic: Secondary | ICD-10-CM

## 2016-11-30 DIAGNOSIS — F4310 Post-traumatic stress disorder, unspecified: Secondary | ICD-10-CM

## 2016-11-30 MED ORDER — PRAZOSIN HCL 2 MG PO CAPS: 2.0000 mg | ORAL_CAPSULE | Freq: Every day | ORAL | 2 refills | Status: DC

## 2016-11-30 MED ORDER — DULOXETINE HCL 60 MG PO CPEP: 60.0000 mg | ORAL_CAPSULE | Freq: Two times a day (BID) | ORAL | 2 refills | Status: DC

## 2016-11-30 MED ORDER — AMPHETAMINE-DEXTROAMPHETAMINE 20 MG PO TABS: 20.0000 mg | ORAL_TABLET | Freq: Two times a day (BID) | ORAL | 0 refills | Status: DC
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
## 2016-12-01 DIAGNOSIS — M797 Fibromyalgia: Secondary | ICD-10-CM | POA: Diagnosis not present

## 2016-12-01 DIAGNOSIS — M79673 Pain in unspecified foot: Secondary | ICD-10-CM | POA: Diagnosis not present

## 2016-12-01 DIAGNOSIS — M5416 Radiculopathy, lumbar region: Secondary | ICD-10-CM | POA: Diagnosis not present

## 2016-12-01 DIAGNOSIS — G629 Polyneuropathy, unspecified: Secondary | ICD-10-CM | POA: Diagnosis not present

## 2016-12-01 DIAGNOSIS — G8929 Other chronic pain: Secondary | ICD-10-CM | POA: Diagnosis not present

## 2016-12-11 ENCOUNTER — Ambulatory Visit (HOSPITAL_COMMUNITY)
Admission: RE | Admit: 2016-12-11 | Discharge: 2016-12-11 | Disposition: A | Discharge status: Home / Self Care | Payer: Medicare Other | Source: Ambulatory Visit | Attending: Pulmonary Disease | Admitting: Pulmonary Disease

## 2016-12-11 DIAGNOSIS — Z1231 Encounter for screening mammogram for malignant neoplasm of breast: Secondary | ICD-10-CM | POA: Insufficient documentation

## 2016-12-14 ENCOUNTER — Other Ambulatory Visit (HOSPITAL_COMMUNITY): Payer: Self-pay | Admitting: Psychiatry

## 2016-12-14 ENCOUNTER — Other Ambulatory Visit (INDEPENDENT_AMBULATORY_CARE_PROVIDER_SITE_OTHER): Payer: Self-pay | Admitting: Internal Medicine

## 2016-12-14 DIAGNOSIS — Z8673 Personal history of transient ischemic attack (TIA), and cerebral infarction without residual deficits: Secondary | ICD-10-CM | POA: Diagnosis not present

## 2016-12-14 DIAGNOSIS — J301 Allergic rhinitis due to pollen: Secondary | ICD-10-CM | POA: Diagnosis not present

## 2016-12-14 DIAGNOSIS — I10 Essential (primary) hypertension: Secondary | ICD-10-CM | POA: Diagnosis not present

## 2016-12-14 DIAGNOSIS — F4310 Post-traumatic stress disorder, unspecified: Secondary | ICD-10-CM

## 2016-12-14 DIAGNOSIS — F4312 Post-traumatic stress disorder, chronic: Secondary | ICD-10-CM

## 2016-12-14 DIAGNOSIS — G2581 Restless legs syndrome: Secondary | ICD-10-CM | POA: Diagnosis not present

## 2016-12-14 DIAGNOSIS — F515 Nightmare disorder: Principal | ICD-10-CM

## 2016-12-25 NOTE — Progress Notes (Signed)
Patient ID: Lindsay Walsh, female   DOB: 1971/03/24, 46 y.o.   MRN: [id number]    HPI: Lindsay Walsh is a 46 y/o patient we are following for hypertension and palpitations with known history of anxiety, tobacco abuse, and fibromyalgia. She was last seen in the office 2016   She comes today with multiple somatic complaints. Chronic neck and back pain, bilateral arm pain, and waking at night short of breath. She sometimes as some chest pressure. She has OSA, but has not been wearing her CPAP because she states it did not fit. She is often tired during the day.   Echo 03/06/15 reviewed  EF normal 55-60% mild MR  Carotid Duplex 03/06/15 reviewed mild plaque no stenosis   She is divorced lives with boyfriend in [place] She is on disability for ? Autoimmune liver disease She has 3 children. Weaned off methadone at [facility] On multiple drugs for anxiety depression night mares and fatigue  History of relatively high HR due to weight and adderall   Allergies  Allergen Reactions  . Doxycycline Shortness Of Breath and Rash  . Fentanyl Shortness Of Breath, Itching and Other (See Comments)    Heart racing, SOB  . Ceftin [Cefuroxime Axetil]   . Iohexol Other (See Comments)    Knots on body   . Ivp Dye [Iodinated Diagnostic Agents] Other (See Comments)    Knots on body  . Norvasc [Amlodipine Besylate]   . Wellbutrin [Bupropion Hcl] Other (See Comments)    unknown  . Latex Rash  . Tape Rash    Current Outpatient Prescriptions  Medication Sig Dispense Refill  . amphetamine-dextroamphetamine (ADDERALL) 20 MG tablet Take 1 tablet (20 mg total) by mouth 2 (two) times daily. 60 tablet 0  . Ascorbic Acid (VITAMIN C) 1000 MG tablet Take 1,000 mg by mouth every morning.     Marland Kitchen aspirin 325 MG EC tablet Take 325 mg by mouth daily.    Marland Kitchen atorvastatin (LIPITOR) 10 MG tablet Take 10 mg by mouth daily.    Marland Kitchen azaTHIOprine (IMURAN) 50 MG tablet take 1 tablet once daily 30 tablet 5  . calcium-vitamin D  (OS-CAL 500 + D) 500-200 MG-UNIT per tablet Take 1 tablet by mouth 2 (two) times daily.     Marland Kitchen CIPRODEX otic suspension Place 2 drops into the left ear as needed. Reported on 10/04/2015    . dicyclomine (BENTYL) 10 MG capsule Take 1 capsule (10 mg total) by mouth daily as needed. For cramps 90 capsule 5  . DULoxetine (CYMBALTA) 60 MG capsule Take 1 capsule (60 mg total) by mouth 2 (two) times daily. 60 capsule 2  . esomeprazole (NEXIUM) 40 MG capsule take 1 capsule by mouth every morning 90 capsule 3  . estradiol (CLIMARA - DOSED IN MG/24 HR) 0.075 mg/24hr Place 1 patch onto the skin once a week. Reported on 10/06/2015    . furosemide (LASIX) 40 MG tablet Take 40 mg by mouth every other day.     . gabapentin (NEURONTIN) 300 MG capsule Take 300 mg by mouth at bedtime as needed (for pain).     Marland Kitchen GARLIC PO Take 3,710 mg by mouth daily.     . hydrALAZINE (APRESOLINE) 50 MG tablet Take 1 tablet (50 mg total) by mouth every 8 (eight) hours. 90 tablet 5  . isosorbide mononitrate (IMDUR) 30 MG 24 hr tablet TAKE ONE (1) TABLET EACH DAY 30 tablet 11  . lidocaine (LIDODERM) 5 % Place 1 patch onto the skin  daily as needed (for pain). 30 patch 0  . lisinopril (PRINIVIL,ZESTRIL) 40 MG tablet Take 40 mg by mouth daily.     . metoprolol succinate (TOPROL-XL) 25 MG 24 hr tablet take 3 tablet by mouth every morning 90 tablet 6  . Multiple Vitamins-Minerals (CENTRUM) tablet Take 1 tablet by mouth daily.    . pramipexole (MIRAPEX) 0.25 MG tablet Take 0.25 mg by mouth at bedtime as needed. For restless legs    . prazosin (MINIPRESS) 2 MG capsule Take 1 capsule (2 mg total) by mouth at bedtime. 30 capsule 2  . PROAIR HFA 108 (90 BASE) MCG/ACT inhaler Inhale 1-2 puffs into the lungs every 6 (six) hours as needed for wheezing or shortness of breath.     . Probiotic Product (RESTORA PO) Take by mouth daily.     Marland Kitchen topiramate (TOPAMAX) 25 MG tablet      No current facility-administered medications for this visit.     Past  Medical History:  Diagnosis Date  . Angina    took SL nitro one week ago  . Arthritis    back  . Autoimmune hepatitis ([place])   . Chest pain    Normal stress echo in 2011; PVCs; pedal edema  . CVA (cerebral infarction)   . Depression   . Dyspareunia 05/06/2014  . Dysrhythmia    palpatations  . Fasting hyperglycemia   . Fibromyalgia   . Gastroesophageal reflux disease    Chronic abdominal pain; gastroparesis; globus hystericus; irritable bowel syndrome  . Hypertension   . Irritable bowel syndrome   . Narcotic dependence ([place])   . Overweight(278.02)   . PTSD (post-traumatic stress disorder)   . Pulmonary nodule   . Shortness of breath    with exertion  . Sleep apnea   . Sleep apnea    on CPAP machine  . Tobacco abuse    one pack per day; 35 pack years    Past Surgical History:  Procedure Laterality Date  . ABDOMINAL HYSTERECTOMY    . BACK SURGERY    . BLADDER SUSPENSION  09/12/2011   Procedure: TRANSVAGINAL TAPE (TVT) PROCEDURE;  Surgeon: [name], MD;  Location: [facility];  Service: Urology;  Laterality: N/A;  . CESAREAN SECTION     X3  . CHOLECYSTECTOMY    . ESOPHAGOGASTRODUODENOSCOPY ENDOSCOPY     "throat stretched" per pt.  Marland Kitchen LIVER BIOPSY     x4  . LUMBAR EPIDURAL INJECTION  01/2012  . TOTAL ABDOMINAL HYSTERECTOMY W/ BILATERAL SALPINGOOPHORECTOMY    . TUBAL LIGATION     Bilateral  . UPPER GASTROINTESTINAL ENDOSCOPY  09/13/2010    ROS: Review of systems complete and found to be negative unless listed above  BP 106/64   Pulse (!) 110   Ht 5' (1.524 m)   Wt 93.9 kg (207 lb)   SpO2 98%   BMI 40.43 kg/m   PHYSICAL EXAM Affect appropriate Obese white female  HEENT: normal Neck supple with no adenopathy JVP normal no bruits no thyromegaly Lungs clear with no wheezing and good diaphragmatic motion Heart:  S1/S2SEM  murmur, no rub, gallop or click PMI normal Abdomen: benighn, BS positve, no tenderness, no AAA no bruit.  No HSM or HJR Distal pulses  intact with no bruits No edema Neuro non-focal Skin warm and dry tattoos  No muscular weakness     EKG:  2015  NSR with non-specific T-wave abnormalities. Rate of  75 bpm.  ASSESSMENT AND PLAN  Chest Pain: atypical  normal myovue 03/21/12  F/u ETT for starters  Palpitations: resolved likely due to nicotine and adderall with HTN heart disease increase Toprol 100 mg stressed compliance HTN: improved so long as she is compliant with meds   Anxiety: improved continue cymbalta Smoking: quit last year no active wheezing normal lung exam  Fibromyalgia:  On imuran sees [facility] really needs some simplification of her med list  FU PRN if ETT normal  Jenkins Rouge

## 2016-12-26 ENCOUNTER — Ambulatory Visit (INDEPENDENT_AMBULATORY_CARE_PROVIDER_SITE_OTHER): Payer: Medicare Other | Admitting: Cardiovascular Disease

## 2016-12-26 ENCOUNTER — Telehealth: Payer: Self-pay | Admitting: Cardiovascular Disease

## 2016-12-26 ENCOUNTER — Encounter: Payer: Self-pay | Admitting: Cardiovascular Disease

## 2016-12-26 VITALS — BP 106/64 | HR 110 | Ht 60.0 in | Wt 207.0 lb

## 2016-12-26 DIAGNOSIS — I10 Essential (primary) hypertension: Secondary | ICD-10-CM

## 2016-12-26 MED ORDER — METOPROLOL SUCCINATE ER 100 MG PO TB24: 100.0000 mg | ORAL_TABLET | Freq: Every day | ORAL | 3 refills | Status: DC

## 2016-12-26 MED ORDER — METOPROLOL SUCCINATE ER 100 MG PO TB24: ORAL_TABLET | ORAL | 3 refills | Status: DC

## 2016-12-26 NOTE — Patient Instructions (Signed)
Medication Instructions:  INCREASE TOPROL TO 100 MG DAILY  Labwork: NONE  Testing/Procedures: Your physician has requested that you have an exercise tolerance test. For further information please visit [email or web address]. Please also follow instruction sheet, as given.    Follow-Up: Your physician recommends that you schedule a follow-up appointment in: AS NEEDED    Any Other Special Instructions Will Be Listed Below (If Applicable).     If you need a refill on your cardiac medications before your next appointment, please call your pharmacy.

## 2016-12-26 NOTE — Telephone Encounter (Signed)
Pt was already taking metoprolol 100 mg,per Dr Mohammed Kindle to 100 mg am and 50 mg pm

## 2016-12-26 NOTE — Telephone Encounter (Signed)
Please give the pt a call concerning her metoprolol succinate (TOPROL-XL) 100 MG 24 hr tablet [[id number]]

## 2017-01-02 ENCOUNTER — Ambulatory Visit (HOSPITAL_COMMUNITY)
Admission: RE | Admit: 2017-01-02 | Discharge: 2017-01-02 | Disposition: A | Discharge status: Home / Self Care | Payer: Medicare Other | Source: Ambulatory Visit | Attending: Cardiovascular Disease | Admitting: Cardiovascular Disease

## 2017-01-02 DIAGNOSIS — I10 Essential (primary) hypertension: Secondary | ICD-10-CM | POA: Diagnosis not present

## 2017-01-02 LAB — EXERCISE TOLERANCE TEST
CHL CUP MPHR: 174 {beats}/min
CHL CUP RESTING HR STRESS: 82 {beats}/min
CHL RATE OF PERCEIVED EXERTION: 17
CSEPEDS: 53 s
CSEPEW: 9.7 METS
CSEPPHR: 136 {beats}/min
Exercise duration (min): 6 min
Percent HR: 78 %

## 2017-01-08 ENCOUNTER — Other Ambulatory Visit (HOSPITAL_COMMUNITY): Payer: Self-pay | Admitting: Psychiatry

## 2017-01-08 DIAGNOSIS — F4310 Post-traumatic stress disorder, unspecified: Secondary | ICD-10-CM

## 2017-01-08 DIAGNOSIS — F4312 Post-traumatic stress disorder, chronic: Secondary | ICD-10-CM

## 2017-01-08 DIAGNOSIS — F515 Nightmare disorder: Principal | ICD-10-CM

## 2017-01-17 DIAGNOSIS — Z0000 Encounter for general adult medical examination without abnormal findings: Secondary | ICD-10-CM | POA: Diagnosis not present

## 2017-01-31 DIAGNOSIS — Z79891 Long term (current) use of opiate analgesic: Secondary | ICD-10-CM | POA: Diagnosis not present

## 2017-01-31 DIAGNOSIS — M5416 Radiculopathy, lumbar region: Secondary | ICD-10-CM | POA: Diagnosis not present

## 2017-01-31 DIAGNOSIS — M797 Fibromyalgia: Secondary | ICD-10-CM | POA: Diagnosis not present

## 2017-01-31 DIAGNOSIS — M4696 Unspecified inflammatory spondylopathy, lumbar region: Secondary | ICD-10-CM | POA: Diagnosis not present

## 2017-02-07 ENCOUNTER — Other Ambulatory Visit (HOSPITAL_COMMUNITY): Payer: Self-pay | Admitting: Psychiatry

## 2017-02-07 DIAGNOSIS — F515 Nightmare disorder: Principal | ICD-10-CM

## 2017-02-07 DIAGNOSIS — F4310 Post-traumatic stress disorder, unspecified: Secondary | ICD-10-CM

## 2017-02-07 DIAGNOSIS — F4312 Post-traumatic stress disorder, chronic: Secondary | ICD-10-CM

## 2017-02-22 DIAGNOSIS — I10 Essential (primary) hypertension: Secondary | ICD-10-CM | POA: Diagnosis not present

## 2017-02-22 DIAGNOSIS — K754 Autoimmune hepatitis: Secondary | ICD-10-CM | POA: Diagnosis not present

## 2017-02-22 DIAGNOSIS — L720 Epidermal cyst: Secondary | ICD-10-CM | POA: Diagnosis not present

## 2017-02-22 DIAGNOSIS — M797 Fibromyalgia: Secondary | ICD-10-CM | POA: Diagnosis not present

## 2017-02-28 ENCOUNTER — Encounter (HOSPITAL_COMMUNITY): Payer: Self-pay | Admitting: Psychiatry

## 2017-02-28 ENCOUNTER — Ambulatory Visit (INDEPENDENT_AMBULATORY_CARE_PROVIDER_SITE_OTHER): Payer: Medicare Other | Admitting: Psychiatry

## 2017-02-28 VITALS — BP 120/81 | HR 88 | Ht 60.0 in | Wt 210.6 lb

## 2017-02-28 DIAGNOSIS — Z818 Family history of other mental and behavioral disorders: Secondary | ICD-10-CM | POA: Diagnosis not present

## 2017-02-28 DIAGNOSIS — Z91410 Personal history of adult physical and sexual abuse: Secondary | ICD-10-CM | POA: Diagnosis not present

## 2017-02-28 DIAGNOSIS — F4310 Post-traumatic stress disorder, unspecified: Secondary | ICD-10-CM | POA: Diagnosis not present

## 2017-02-28 DIAGNOSIS — Z813 Family history of other psychoactive substance abuse and dependence: Secondary | ICD-10-CM

## 2017-02-28 DIAGNOSIS — Z634 Disappearance and death of family member: Secondary | ICD-10-CM

## 2017-02-28 DIAGNOSIS — F4312 Post-traumatic stress disorder, chronic: Secondary | ICD-10-CM

## 2017-02-28 DIAGNOSIS — F515 Nightmare disorder: Secondary | ICD-10-CM

## 2017-02-28 DIAGNOSIS — Z62810 Personal history of physical and sexual abuse in childhood: Secondary | ICD-10-CM | POA: Diagnosis not present

## 2017-02-28 DIAGNOSIS — F331 Major depressive disorder, recurrent, moderate: Secondary | ICD-10-CM | POA: Diagnosis not present

## 2017-02-28 DIAGNOSIS — Z736 Limitation of activities due to disability: Secondary | ICD-10-CM

## 2017-02-28 DIAGNOSIS — Z811 Family history of alcohol abuse and dependence: Secondary | ICD-10-CM | POA: Diagnosis not present

## 2017-02-28 MED ORDER — DULOXETINE HCL 60 MG PO CPEP: 60.0000 mg | ORAL_CAPSULE | Freq: Two times a day (BID) | ORAL | 2 refills | Status: DC

## 2017-02-28 MED ORDER — AMPHETAMINE-DEXTROAMPHETAMINE 20 MG PO TABS: 20.0000 mg | ORAL_TABLET | Freq: Two times a day (BID) | ORAL | 0 refills | Status: DC

## 2017-02-28 MED ORDER — PRAZOSIN HCL 2 MG PO CAPS: 2.0000 mg | ORAL_CAPSULE | Freq: Every day | ORAL | 2 refills | Status: DC
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
## 2017-03-07 ENCOUNTER — Emergency Department (HOSPITAL_COMMUNITY)
Admission: EM | Admit: 2017-03-07 | Discharge: 2017-03-07 | Disposition: A | Discharge status: Home / Self Care | Payer: Medicare Other | Attending: Emergency Medicine | Admitting: Emergency Medicine

## 2017-03-07 ENCOUNTER — Encounter (HOSPITAL_COMMUNITY): Payer: Self-pay

## 2017-03-07 DIAGNOSIS — I10 Essential (primary) hypertension: Secondary | ICD-10-CM | POA: Diagnosis not present

## 2017-03-07 DIAGNOSIS — R1110 Vomiting, unspecified: Secondary | ICD-10-CM | POA: Diagnosis not present

## 2017-03-07 DIAGNOSIS — K219 Gastro-esophageal reflux disease without esophagitis: Secondary | ICD-10-CM | POA: Diagnosis not present

## 2017-03-07 DIAGNOSIS — Z87891 Personal history of nicotine dependence: Secondary | ICD-10-CM | POA: Diagnosis not present

## 2017-03-07 DIAGNOSIS — R739 Hyperglycemia, unspecified: Secondary | ICD-10-CM | POA: Diagnosis not present

## 2017-03-07 DIAGNOSIS — R112 Nausea with vomiting, unspecified: Secondary | ICD-10-CM | POA: Diagnosis not present

## 2017-03-07 DIAGNOSIS — R197 Diarrhea, unspecified: Secondary | ICD-10-CM | POA: Diagnosis not present

## 2017-03-07 LAB — URINALYSIS, ROUTINE W REFLEX MICROSCOPIC
BILIRUBIN URINE: NEGATIVE
GLUCOSE, UA: NEGATIVE mg/dL
HGB URINE DIPSTICK: NEGATIVE
Ketones, ur: NEGATIVE mg/dL
NITRITE: NEGATIVE
Protein, ur: NEGATIVE mg/dL
SPECIFIC GRAVITY, URINE: 1.021 (ref 1.005–1.030)
pH: 6 (ref 5.0–8.0)

## 2017-03-07 LAB — CBC WITH DIFFERENTIAL/PLATELET
BASOS ABS: 0 10*3/uL (ref 0.0–0.1)
BASOS PCT: 0 %
EOS ABS: 0.1 10*3/uL (ref 0.0–0.7)
Eosinophils Relative: 1 %
HCT: 41.2 % (ref 36.0–46.0)
Hemoglobin: 14.1 g/dL (ref 12.0–15.0)
Lymphocytes Relative: 13 %
Lymphs Abs: 2.2 10*3/uL (ref 0.7–4.0)
MCH: 31.1 pg (ref 26.0–34.0)
MCHC: 34.2 g/dL (ref 30.0–36.0)
MCV: 90.9 fL (ref 78.0–100.0)
MONO ABS: 1.1 10*3/uL — AB (ref 0.1–1.0)
MONOS PCT: 6 %
NEUTROS ABS: 13.4 10*3/uL — AB (ref 1.7–7.7)
NEUTROS PCT: 80 %
Platelets: 340 10*3/uL (ref 150–400)
RBC: 4.53 MIL/uL (ref 3.87–5.11)
RDW: 12.5 % (ref 11.5–15.5)
WBC: 16.7 10*3/uL — ABNORMAL HIGH (ref 4.0–10.5)

## 2017-03-07 LAB — COMPREHENSIVE METABOLIC PANEL
ALBUMIN: 4.3 g/dL (ref 3.5–5.0)
ALT: 20 U/L (ref 14–54)
ANION GAP: 10 (ref 5–15)
AST: 23 U/L (ref 15–41)
Alkaline Phosphatase: 99 U/L (ref 38–126)
BUN: 16 mg/dL (ref 6–20)
CALCIUM: 9 mg/dL (ref 8.9–10.3)
CHLORIDE: 100 mmol/L — AB (ref 101–111)
CO2: 26 mmol/L (ref 22–32)
CREATININE: 0.81 mg/dL (ref 0.44–1.00)
GFR calc Af Amer: >60 mL/min (ref >60)
GFR calc non Af Amer: >60 mL/min (ref >60)
Glucose, Bld: 200 mg/dL — ABNORMAL HIGH (ref 65–99)
POTASSIUM: 4 mmol/L (ref 3.5–5.1)
SODIUM: 136 mmol/L (ref 135–145)
Total Bilirubin: 0.6 mg/dL (ref 0.3–1.2)
Total Protein: 8.4 g/dL — ABNORMAL HIGH (ref 6.5–8.1)

## 2017-03-07 LAB — LIPASE, BLOOD: Lipase: 33 U/L (ref 11–51)

## 2017-03-07 LAB — CBG MONITORING, ED: GLUCOSE-CAPILLARY: 220 mg/dL — AB (ref 65–99)

## 2017-03-07 MED ORDER — SODIUM CHLORIDE 0.9 % IV BOLUS (SEPSIS)
1000.0000 mL | Freq: Once | INTRAVENOUS | Status: AC
  Administered 2017-03-07: 1000 mL via INTRAVENOUS

## 2017-03-07 MED ORDER — ONDANSETRON HCL 4 MG/2ML IJ SOLN
4.0000 mg | Freq: Once | INTRAMUSCULAR | Status: AC
  Administered 2017-03-07: 4 mg via INTRAVENOUS
  Filled 2017-03-07: qty 2

## 2017-03-07 MED ORDER — LOPERAMIDE HCL 2 MG PO CAPS: 2.0000 mg | ORAL_CAPSULE | Freq: Four times a day (QID) | ORAL | 0 refills | Status: DC | PRN

## 2017-03-07 MED ORDER — ONDANSETRON 4 MG PO TBDP: 4.0000 mg | ORAL_TABLET | Freq: Three times a day (TID) | ORAL | 0 refills | Status: DC | PRN

## 2017-03-07 NOTE — ED Triage Notes (Signed)
Pt reports fatigue since Sunday.  Started having v/d sicne waking this morning.  Pt says her cousin checked her blood sugar with his machine and reports was 280.  Pt says she was 'borderline" diabetic.

## 2017-03-07 NOTE — ED Notes (Signed)
Have given pt crackers, diet coke, and water.

## 2017-03-07 NOTE — Discharge Instructions (Signed)

## 2017-03-07 NOTE — ED Notes (Signed)
Twice unsuccessful IV attempts. Will consult with another nurse to do an ultrasound IV

## 2017-03-07 NOTE — ED Provider Notes (Signed)
Emergency Department Provider Note   I have reviewed the triage vital signs and the nursing notes.   HISTORY  Chief Complaint Hyperglycemia   HPI Lindsay Walsh is a 46 y.o. female with PMH of autoimmune heaptitis, HTN, IBS, and tobacco use presents to the emergency department for evaluation of hyperglycemia with associated vomiting and diarrhea. Symptoms began abruptly this morning. Patient states that she has known about slightly elevated blood sugars for the past year and she is working with her primary care physician in dealing with this issue. She is not taking any diabetes medication. This morning she awoke with severe vomiting and diarrhea. She states she lives with someone is diabetic who urged her present to the emergency department when found that her blood sugar was significantly elevated. While in the waiting room she had additional vomiting and diarrhea. No sick contacts. No recent travel. No antibiotics. No fever or chills.   Past Medical History:  Diagnosis Date  . Angina    took SL nitro one week ago  . Arthritis    back  . Autoimmune hepatitis ([place])   . Chest pain    Normal stress echo in 2011; PVCs; pedal edema  . CVA (cerebral infarction)   . Depression   . Dyspareunia 05/06/2014  . Dysrhythmia    palpatations  . Fasting hyperglycemia   . Fibromyalgia   . Gastroesophageal reflux disease    Chronic abdominal pain; gastroparesis; globus hystericus; irritable bowel syndrome  . Hypertension   . Irritable bowel syndrome   . Narcotic dependence ([place])   . Overweight(278.02)   . PTSD (post-traumatic stress disorder)   . Pulmonary nodule   . Shortness of breath    with exertion  . Sleep apnea   . Sleep apnea    on CPAP machine  . Tobacco abuse    one pack per day; 35 pack years    Patient Active Problem List   Diagnosis Date Noted  . Essential hypertension, malignant 03/07/2015  . CVA (cerebral infarction) 03/05/2015  . Dyspareunia 05/06/2014  .  Obesity 09/30/2013  . Bilateral hip pain 07/16/2013  . Chronic pain syndrome 07/16/2013  . Degenerative disc disease, lumbar 07/16/2013  . Greater trochanteric bursitis of both hips 07/16/2013  . Nightmares associated with chronic post-traumatic stress disorder 10/18/2012  . Sprain of foot 03/14/2012  . Weakness 01/29/2012  . Hepatitis, autoimmune ([place]) 01/29/2012  . IBS (irritable bowel syndrome) 08/01/2011  . Gastroesophageal reflux disease   . Hypertension   . Depression   . Chest pain   . Fasting hyperglycemia   . TOBACCO ABUSE 04/28/2010  . NARCOTIC ABUSE 04/28/2010  . PULMONARY NODULE 04/28/2010  . AUTOIMMUNE HEPATITIS 04/28/2010  . Myalgia and myositis 01/06/2009    Past Surgical History:  Procedure Laterality Date  . ABDOMINAL HYSTERECTOMY    . BACK SURGERY    . BLADDER SUSPENSION  09/12/2011   Procedure: TRANSVAGINAL TAPE (TVT) PROCEDURE;  Surgeon: [name], MD;  Location: [facility];  Service: Urology;  Laterality: N/A;  . CESAREAN SECTION     X3  . CHOLECYSTECTOMY    . ESOPHAGOGASTRODUODENOSCOPY ENDOSCOPY     "throat stretched" per pt.  Marland Kitchen LIVER BIOPSY     x4  . LUMBAR EPIDURAL INJECTION  01/2012  . TOTAL ABDOMINAL HYSTERECTOMY W/ BILATERAL SALPINGOOPHORECTOMY    . TUBAL LIGATION     Bilateral  . UPPER GASTROINTESTINAL ENDOSCOPY  09/13/2010    Current Outpatient Rx  . Order #: [id number] Class: Historical Med  .  Order #: [id number] Class: Print  . Order #: [id number] Class: Historical Med  . Order #: [id number] Class: Historical Med  . Order #: [id number] Class: Historical Med  . Order #: [id number] Class: Normal  . Order #: [id number] Class: Historical Med  . Order #: [id number] Class: Historical Med  . Order #: [id number] Class: Normal  . Order #: [id number] Class: Normal  . Order #: [id number] Class: Normal  . Order #: [id number] Class: Historical Med  . Order #: [id number] Class: Historical Med  . Order #: [id number] Class: Historical Med  . Order #: [id number] Class:  Historical Med  . Order #: [id number] Class: Normal  . Order #: [id number] Class: Normal  . Order #: [id number] Class: Historical Med  . Order #: [id number] Class: Normal  . Order #: [id number] Class: Historical Med  . Order #: [id number] Class: Historical Med  . Order #: [id number] Class: Normal  . Order #: [id number] Class: Historical Med  . Order #: [id number] Class: Historical Med  . Order #: [id number] Class: Print  . Order #: [id number] Class: Print    Allergies Doxycycline; Fentanyl; Ceftin [cefuroxime axetil]; Iohexol; Ivp dye [iodinated diagnostic agents]; Norvasc [amlodipine besylate]; Wellbutrin [bupropion hcl]; Latex; and Tape  Family History  Problem Relation Age of Onset  . Diabetes Mother   . Hypertension Mother   . Anxiety disorder Mother   . Depression Mother   . Drug abuse Mother   . Heart disease Father   . Diabetes Father   . Anxiety disorder Father   . Alcohol abuse Father   . Depression Father   . OCD Father   . Thyroid disease Sister   . Healthy Brother   . Healthy Daughter   . Healthy Son   . ADD / ADHD Son   . Alcohol abuse Paternal Grandfather   . Depression Paternal Grandfather   . Cancer Paternal Grandfather        lung,skin  . Tuberculosis Paternal Grandfather   . Seizures Paternal Grandmother   . Lupus Paternal Grandmother   . ADD / ADHD Son   . Anesthesia problems Neg Hx   . Malignant hyperthermia Neg Hx   . Pseudochol deficiency Neg Hx   . Hypotension Neg Hx   . Bipolar disorder Neg Hx   . Dementia Neg Hx   . Paranoid behavior Neg Hx   . Schizophrenia Neg Hx   . Sexual abuse Neg Hx   . Physical abuse Neg Hx     Social History Social History  Substance Use Topics  . Smoking status: Former Smoker    Packs/day: 1.50    Years: 25.00    Types: Cigarettes    Quit date: 04/25/2013  . Smokeless tobacco: Never Used     Comment: 15-20 cigs a day as of 11/12/2012  . Alcohol use No    Review of Systems  Constitutional: No fever/chills Eyes: No visual  changes. ENT: No sore throat. Cardiovascular: Denies chest pain. Respiratory: Denies shortness of breath. Gastrointestinal: No abdominal pain. Positive nausea, vomiting, and diarrhea.  No constipation. Genitourinary: Negative for dysuria. Musculoskeletal: Negative for back pain. Skin: Negative for rash. Neurological: Negative for headaches, focal weakness or numbness.  10-point ROS otherwise negative.  ____________________________________________   PHYSICAL EXAM:  VITAL SIGNS: ED Triage Vitals  Enc Vitals Group     BP 03/07/17 1449 102/80     Pulse Rate 03/07/17 1150 86     Resp 03/07/17 1150 19     Temp 03/07/17 1150 98.3 F (36.8 C)     Temp Source 03/07/17 1150 Oral     SpO2 03/07/17 1150 95 %  Weight 03/07/17 1151 210 lb (95.3 kg)     Height 03/07/17 1151 5' (1.524 m)     Pain Score 03/07/17 1141 7   Constitutional: Alert and oriented. Well appearing and in no acute distress. Eyes: Conjunctivae are normal.  Head: Atraumatic. Nose: No congestion/rhinnorhea. Mouth/Throat: Mucous membranes are dry.   Neck: No stridor.   Cardiovascular: Normal rate, regular rhythm. Good peripheral circulation. Grossly normal heart sounds.   Respiratory: Normal respiratory effort.  No retractions. Lungs CTAB. Gastrointestinal: Soft and nontender. No distention.  Musculoskeletal: No lower extremity tenderness nor edema. No gross deformities of extremities. Neurologic:  Normal speech and language. No gross focal neurologic deficits are appreciated.  Skin:  Skin is warm, dry and intact. No rash noted.  ____________________________________________   LABS (all labs ordered are listed, but only abnormal results are displayed)  Labs Reviewed  CBC WITH DIFFERENTIAL/PLATELET - Abnormal; Notable for the following:       Result Value   WBC 16.7 (*)    Neutro Abs 13.4 (*)    Monocytes Absolute 1.1 (*)    All other components within normal limits  COMPREHENSIVE METABOLIC PANEL - Abnormal;  Notable for the following:    Chloride 100 (*)    Glucose, Bld 200 (*)    Total Protein 8.4 (*)    All other components within normal limits  URINALYSIS, ROUTINE W REFLEX MICROSCOPIC - Abnormal; Notable for the following:    APPearance HAZY (*)    Leukocytes, UA MODERATE (*)    Bacteria, UA RARE (*)    Squamous Epithelial / LPF 6-30 (*)    All other components within normal limits  CBG MONITORING, ED - Abnormal; Notable for the following:    Glucose-Capillary 220 (*)    All other components within normal limits  LIPASE, BLOOD   ____________________________________________  RADIOLOGY  None ____________________________________________   PROCEDURES  Procedure(s) performed:   Procedures  None ____________________________________________   INITIAL IMPRESSION / ASSESSMENT AND PLAN / ED COURSE  Pertinent labs & imaging results that were available during my care of the patient were reviewed by me and considered in my medical decision making (see chart for details).  Patient presents to the emergency room in for evaluation of nausea, vomiting, diarrhea with associated hyperglycemia. Lab work from triage shows normal CO2 and anion gap. Patient has a leukocytosis that fits with infectious etiology of symptoms. No sick contacts. No recent antibiotics. Patient's abdomen is soft and non-tender with no indication for CT imaging at this time. Plan for IV fluids, Zofran, reassessment.  05:55 PM Patient feeling much better after IV fluids and Zofran. She is tolerating PO. Plan for discharge with PCP follow up and symptom mgmt at home.   At this time, I do not feel there is any life-threatening condition present. I have reviewed and discussed all results (EKG, imaging, lab, urine as appropriate), exam findings with patient. I have reviewed nursing notes and appropriate previous records.  I feel the patient is safe to be discharged home without further emergent workup. Discussed usual and  customary return precautions. Patient and family (if present) verbalize understanding and are comfortable with this plan.  Patient will follow-up with their primary care provider. If they do not have a primary care provider, information for follow-up has been provided to them. All questions have been answered.  ____________________________________________  FINAL CLINICAL IMPRESSION(S) / ED DIAGNOSES  Final diagnoses:  Vomiting and diarrhea  Hyperglycemia     MEDICATIONS GIVEN DURING THIS VISIT:  Medications  sodium chloride 0.9 % bolus 1,000 mL (0 mLs Intravenous Stopped 03/07/17 1842)  ondansetron (ZOFRAN) injection 4 mg (4 mg Intravenous Given 03/07/17 1618)     NEW OUTPATIENT MEDICATIONS STARTED DURING THIS VISIT:  Discharge Medication List as of 03/07/2017  5:57 PM    START taking these medications   Details  loperamide (IMODIUM) 2 MG capsule Take 1 capsule (2 mg total) by mouth 4 (four) times daily as needed for diarrhea or loose stools., Starting Wed 03/07/2017, Print    ondansetron (ZOFRAN ODT) 4 MG disintegrating tablet Take 1 tablet (4 mg total) by mouth every 8 (eight) hours as needed for nausea or vomiting., Starting Wed 03/07/2017, Print        Note:  This document was prepared using Dragon voice recognition software and may include unintentional dictation errors.  Nanda Quinton, MD Emergency Medicine    Koston Hennes, [facility], MD 03/07/17 2111

## 2017-03-14 ENCOUNTER — Encounter (HOSPITAL_COMMUNITY): Payer: Self-pay | Admitting: Emergency Medicine

## 2017-03-14 ENCOUNTER — Emergency Department (HOSPITAL_COMMUNITY)
Admission: EM | Admit: 2017-03-14 | Discharge: 2017-03-15 | Disposition: A | Discharge status: Home / Self Care | Payer: Medicare Other | Attending: Emergency Medicine | Admitting: Emergency Medicine

## 2017-03-14 DIAGNOSIS — Z7982 Long term (current) use of aspirin: Secondary | ICD-10-CM | POA: Diagnosis not present

## 2017-03-14 DIAGNOSIS — Z87891 Personal history of nicotine dependence: Secondary | ICD-10-CM | POA: Diagnosis not present

## 2017-03-14 DIAGNOSIS — Z79899 Other long term (current) drug therapy: Secondary | ICD-10-CM | POA: Insufficient documentation

## 2017-03-14 DIAGNOSIS — I880 Nonspecific mesenteric lymphadenitis: Secondary | ICD-10-CM | POA: Diagnosis not present

## 2017-03-14 DIAGNOSIS — Z8673 Personal history of transient ischemic attack (TIA), and cerebral infarction without residual deficits: Secondary | ICD-10-CM | POA: Diagnosis not present

## 2017-03-14 DIAGNOSIS — Z91040 Latex allergy status: Secondary | ICD-10-CM | POA: Insufficient documentation

## 2017-03-14 DIAGNOSIS — R112 Nausea with vomiting, unspecified: Secondary | ICD-10-CM | POA: Insufficient documentation

## 2017-03-14 DIAGNOSIS — R1110 Vomiting, unspecified: Secondary | ICD-10-CM | POA: Diagnosis not present

## 2017-03-14 DIAGNOSIS — I10 Essential (primary) hypertension: Secondary | ICD-10-CM | POA: Diagnosis not present

## 2017-03-14 LAB — CBC WITH DIFFERENTIAL/PLATELET
BASOS PCT: 0 %
Basophils Absolute: 0.1 10*3/uL (ref 0.0–0.1)
EOS ABS: 0.4 10*3/uL (ref 0.0–0.7)
Eosinophils Relative: 2 %
HEMATOCRIT: 42.8 % (ref 36.0–46.0)
HEMOGLOBIN: 14.4 g/dL (ref 12.0–15.0)
Lymphocytes Relative: 9 %
Lymphs Abs: 1.9 10*3/uL (ref 0.7–4.0)
MCH: 30.2 pg (ref 26.0–34.0)
MCHC: 33.6 g/dL (ref 30.0–36.0)
MCV: 89.7 fL (ref 78.0–100.0)
MONOS PCT: 9 %
Monocytes Absolute: 2 10*3/uL — ABNORMAL HIGH (ref 0.1–1.0)
NEUTROS ABS: 17.9 10*3/uL — AB (ref 1.7–7.7)
NEUTROS PCT: 80 %
Platelets: 343 10*3/uL (ref 150–400)
RBC: 4.77 MIL/uL (ref 3.87–5.11)
RDW: 12.6 % (ref 11.5–15.5)
WBC: 22.2 10*3/uL — AB (ref 4.0–10.5)

## 2017-03-14 LAB — COMPREHENSIVE METABOLIC PANEL
ALK PHOS: 98 U/L (ref 38–126)
ALT: 21 U/L (ref 14–54)
ANION GAP: 12 (ref 5–15)
AST: 26 U/L (ref 15–41)
Albumin: 4.1 g/dL (ref 3.5–5.0)
BUN: 12 mg/dL (ref 6–20)
CALCIUM: 9.7 mg/dL (ref 8.9–10.3)
CO2: 25 mmol/L (ref 22–32)
Chloride: 99 mmol/L — ABNORMAL LOW (ref 101–111)
Creatinine, Ser: 0.77 mg/dL (ref 0.44–1.00)
Glucose, Bld: 197 mg/dL — ABNORMAL HIGH (ref 65–99)
Potassium: 4 mmol/L (ref 3.5–5.1)
SODIUM: 136 mmol/L (ref 135–145)
Total Bilirubin: 0.7 mg/dL (ref 0.3–1.2)
Total Protein: 8 g/dL (ref 6.5–8.1)

## 2017-03-14 LAB — LIPASE, BLOOD: LIPASE: 37 U/L (ref 11–51)

## 2017-03-14 MED ORDER — FAMOTIDINE IN NACL 20-0.9 MG/50ML-% IV SOLN
20.0000 mg | Freq: Once | INTRAVENOUS | Status: AC
  Administered 2017-03-14: 20 mg via INTRAVENOUS
  Filled 2017-03-14: qty 50

## 2017-03-14 MED ORDER — IOPAMIDOL (ISOVUE-300) INJECTION 61%
INTRAVENOUS | Status: AC
  Administered 2017-03-15: 100 mL
  Filled 2017-03-14: qty 100

## 2017-03-14 MED ORDER — HYDROCORTISONE NA SUCCINATE PF 250 MG IJ SOLR
200.0000 mg | Freq: Once | INTRAMUSCULAR | Status: AC
  Administered 2017-03-14: 200 mg via INTRAVENOUS
  Filled 2017-03-14: qty 200

## 2017-03-14 MED ORDER — DIPHENHYDRAMINE HCL 50 MG/ML IJ SOLN
50.0000 mg | Freq: Once | INTRAMUSCULAR | Status: AC
  Administered 2017-03-14: 50 mg via INTRAVENOUS
  Filled 2017-03-14 (×2): qty 1

## 2017-03-14 MED ORDER — DIPHENHYDRAMINE HCL 25 MG PO CAPS
50.0000 mg | ORAL_CAPSULE | Freq: Once | ORAL | Status: AC
  Filled 2017-03-14: qty 2

## 2017-03-14 MED ORDER — SODIUM CHLORIDE 0.9 % IV BOLUS (SEPSIS)
1000.0000 mL | Freq: Once | INTRAVENOUS | Status: AC
  Administered 2017-03-14: 1000 mL via INTRAVENOUS

## 2017-03-14 MED ORDER — MORPHINE SULFATE (PF) 4 MG/ML IV SOLN: 4.0000 mg | Freq: Once | INTRAVENOUS | Status: DC

## 2017-03-14 NOTE — ED Provider Notes (Signed)
[facility] DEPT Provider Note   CSN: [id number] Arrival date & time: 03/14/17  1644     History   Chief Complaint Chief Complaint  Patient presents with  . Emesis    HPI Lindsay Walsh is a 46 y.o. female.  HPI Patient, with a past medical history of autoimmune hepatitis, hyperglycemia, hypertension, IBS, presents to ED for three-hour history of acute onset nausea and vomiting. She reports similar symptoms about 3 times this month. She was told by her PCP that she does have "borderline diabetes" and elevated blood sugar but her PCP has not started her on any medications for this. She has taken Zofran this morning with no relief in her symptoms. She denies any abdominal pain, hematemesis, urinary symptoms, diarrhea, constipation, blood in stool, sick contacts, recent antibiotic use, fever.  Past Medical History:  Diagnosis Date  . Angina    took SL nitro one week ago  . Arthritis    back  . Autoimmune hepatitis ([place])   . Chest pain    Normal stress echo in 2011; PVCs; pedal edema  . CVA (cerebral infarction)   . Depression   . Dyspareunia 05/06/2014  . Dysrhythmia    palpatations  . Fasting hyperglycemia   . Fibromyalgia   . Gastroesophageal reflux disease    Chronic abdominal pain; gastroparesis; globus hystericus; irritable bowel syndrome  . Hypertension   . Irritable bowel syndrome   . Narcotic dependence ([place])   . Overweight(278.02)   . PTSD (post-traumatic stress disorder)   . Pulmonary nodule   . Shortness of breath    with exertion  . Sleep apnea   . Sleep apnea    on CPAP machine  . Tobacco abuse    one pack per day; 35 pack years    Patient Active Problem List   Diagnosis Date Noted  . Essential hypertension, malignant 03/07/2015  . CVA (cerebral infarction) 03/05/2015  . Dyspareunia 05/06/2014  . Obesity 09/30/2013  . Bilateral hip pain 07/16/2013  . Chronic pain syndrome 07/16/2013  . Degenerative disc disease, lumbar 07/16/2013  . Greater  trochanteric bursitis of both hips 07/16/2013  . Nightmares associated with chronic post-traumatic stress disorder 10/18/2012  . Sprain of foot 03/14/2012  . Weakness 01/29/2012  . Hepatitis, autoimmune ([place]) 01/29/2012  . IBS (irritable bowel syndrome) 08/01/2011  . Gastroesophageal reflux disease   . Hypertension   . Depression   . Chest pain   . Fasting hyperglycemia   . TOBACCO ABUSE 04/28/2010  . NARCOTIC ABUSE 04/28/2010  . PULMONARY NODULE 04/28/2010  . AUTOIMMUNE HEPATITIS 04/28/2010  . Myalgia and myositis 01/06/2009    Past Surgical History:  Procedure Laterality Date  . ABDOMINAL HYSTERECTOMY    . BACK SURGERY    . BLADDER SUSPENSION  09/12/2011   Procedure: TRANSVAGINAL TAPE (TVT) PROCEDURE;  Surgeon: [name], MD;  Location: [facility];  Service: Urology;  Laterality: N/A;  . CESAREAN SECTION     X3  . CHOLECYSTECTOMY    . ESOPHAGOGASTRODUODENOSCOPY ENDOSCOPY     "throat stretched" per pt.  Marland Kitchen LIVER BIOPSY     x4  . LUMBAR EPIDURAL INJECTION  01/2012  . TOTAL ABDOMINAL HYSTERECTOMY W/ BILATERAL SALPINGOOPHORECTOMY    . TUBAL LIGATION     Bilateral  . UPPER GASTROINTESTINAL ENDOSCOPY  09/13/2010    OB History    Gravida Para Term Preterm AB Living   3 3       3    SAB TAB Ectopic Multiple Live Births  Home Medications    Prior to Admission medications   Medication Sig Start Date End Date Taking? Authorizing Provider  ammonium lactate (AMLACTIN) 12 % cream  12/14/16   [provider]  amphetamine-dextroamphetamine (ADDERALL) 20 MG tablet Take 1 tablet (20 mg total) by mouth 2 (two) times daily. 02/28/17 02/28/18  Cloria Spring, MD  Ascorbic Acid (VITAMIN C) 1000 MG tablet Take 1,000 mg by mouth every morning.     [provider]  aspirin 325 MG EC tablet Take 325 mg by mouth daily.    [provider]  atorvastatin (LIPITOR) 80 MG tablet Take 1 tablet by mouth daily. 12/17/16   [provider]    azaTHIOprine (IMURAN) 50 MG tablet take 1 tablet once daily 12/14/16   [name], Mechele Dawley, MD  calcium-vitamin D (OS-CAL 500 + D) 500-200 MG-UNIT per tablet Take 1 tablet by mouth 2 (two) times daily.     [provider]  CIPRODEX otic suspension Place 2 drops into the left ear as needed. Reported on 10/04/2015 02/19/15   [provider]  dicyclomine (BENTYL) 10 MG capsule Take 1 capsule (10 mg total) by mouth daily as needed. For cramps 03/08/15   Sinda Du, MD  DULoxetine (CYMBALTA) 60 MG capsule Take 1 capsule (60 mg total) by mouth 2 (two) times daily. 02/28/17   Cloria Spring, MD  esomeprazole (NEXIUM) 40 MG capsule take 1 capsule by mouth every morning 11/24/16   Setzer, Rona Ravens, NP  fexofenadine (ALLEGRA) 60 MG tablet Take 1 tablet by mouth 2 (two) times daily as needed. 06/27/10   [provider]  furosemide (LASIX) 40 MG tablet Take 40 mg by mouth every other day.     [provider]  gabapentin (NEURONTIN) 300 MG capsule Take 300 mg by mouth at bedtime as needed (for pain).     [provider]  GARLIC PO Take 3,335 mg by mouth daily.     [provider]  hydrALAZINE (APRESOLINE) 50 MG tablet Take 1 tablet (50 mg total) by mouth every 8 (eight) hours. 03/08/15   Sinda Du, MD  isosorbide mononitrate (IMDUR) 30 MG 24 hr tablet TAKE ONE (1) TABLET EACH DAY 03/09/14   Lendon Colonel, NP  lisinopril (PRINIVIL,ZESTRIL) 40 MG tablet Take 40 mg by mouth daily.  09/23/12   [provider]  loperamide (IMODIUM) 2 MG capsule Take 1 capsule (2 mg total) by mouth 4 (four) times daily as needed for diarrhea or loose stools. 03/07/17   Long, Wonda Olds, MD  metoprolol succinate (TOPROL-XL) 100 MG 24 hr tablet Take 100 mg in the am, and take 50 mg (1/2 tablet) in the pm for a total of 150 mg daily 12/26/16   Josue Hector, MD  Multiple Vitamins-Minerals (CENTRUM) tablet Take 1 tablet by mouth daily.    [provider]   ondansetron (ZOFRAN ODT) 4 MG disintegrating tablet Take 1 tablet (4 mg total) by mouth every 8 (eight) hours as needed for nausea or vomiting. 03/07/17   Long, Wonda Olds, MD  pramipexole (MIRAPEX) 0.25 MG tablet Take 0.25 mg by mouth at bedtime as needed. For restless legs    [provider]  prazosin (MINIPRESS) 2 MG capsule Take 1 capsule (2 mg total) by mouth at bedtime. 02/28/17   Cloria Spring, MD  PROAIR HFA 108 [phone] BASE) MCG/ACT inhaler Inhale 1-2 puffs into the lungs every 6 (six) hours as needed for wheezing or shortness of breath.  12/16/12   [provider]  Probiotic Product (RESTORA PO) Take by mouth daily.     [provider]    Family History Family History  Problem Relation Age of Onset  . Diabetes Mother   . Hypertension Mother   . Anxiety disorder Mother   . Depression Mother   . Drug abuse Mother   . Heart disease Father   . Diabetes Father   . Anxiety disorder Father   . Alcohol abuse Father   . Depression Father   . OCD Father   . Thyroid disease Sister   . Healthy Brother   . Healthy Daughter   . Healthy Son   . ADD / ADHD Son   . Alcohol abuse Paternal Grandfather   . Depression Paternal Grandfather   . Cancer Paternal Grandfather        lung,skin  . Tuberculosis Paternal Grandfather   . Seizures Paternal Grandmother   . Lupus Paternal Grandmother   . ADD / ADHD Son   . Anesthesia problems Neg Hx   . Malignant hyperthermia Neg Hx   . Pseudochol deficiency Neg Hx   . Hypotension Neg Hx   . Bipolar disorder Neg Hx   . Dementia Neg Hx   . Paranoid behavior Neg Hx   . Schizophrenia Neg Hx   . Sexual abuse Neg Hx   . Physical abuse Neg Hx     Social History Social History  Substance Use Topics  . Smoking status: Former Smoker    Packs/day: 1.50    Years: 25.00    Types: Cigarettes    Quit date: 04/25/2013  . Smokeless tobacco: Never Used     Comment: 15-20 cigs a day as of 11/12/2012  . Alcohol use No      Allergies   Doxycycline; Fentanyl; Ceftin [cefuroxime axetil]; Iohexol; Ivp dye [iodinated diagnostic agents]; Norvasc [amlodipine besylate]; Wellbutrin [bupropion hcl]; Latex; and Tape   Review of Systems Review of Systems  Constitutional: Negative for appetite change, chills and fever.  HENT: Negative for ear pain, rhinorrhea, sneezing and sore throat.   Eyes: Negative for photophobia and visual disturbance.  Respiratory: Negative for cough, chest tightness, shortness of breath and wheezing.   Cardiovascular: Negative for chest pain and palpitations.  Gastrointestinal: Positive for nausea and vomiting. Negative for abdominal pain, blood in stool, constipation and diarrhea.  Genitourinary: Negative for dysuria, hematuria and urgency.  Musculoskeletal: Negative for myalgias.  Skin: Negative for rash.  Neurological: Negative for dizziness, weakness and light-headedness.     Physical Exam Updated Vital Signs BP 110/73   Pulse 82   Temp 98 F (36.7 C) (Oral)   Resp 20   SpO2 100%   Physical Exam  Constitutional: She appears well-developed and well-nourished. No distress.  Nontoxic appearing and in no acute distress.  HENT:  Head: Normocephalic and atraumatic.  Nose: Nose normal.  Eyes: Conjunctivae and EOM are normal. Right eye exhibits no discharge. Left eye exhibits no discharge. No scleral icterus.  Neck: Normal range of motion. Neck supple.  Cardiovascular: Normal rate, regular rhythm, normal heart sounds and intact distal pulses.  Exam reveals no gallop and no friction rub.   No murmur heard. Pulmonary/Chest: Effort normal and breath sounds normal. No respiratory distress.  Abdominal: Soft. Bowel sounds are normal. She exhibits no distension. There is no tenderness. There is no guarding.  No abdominal tenderness to palpation.  Musculoskeletal: Normal range of motion. She exhibits no edema.  Neurological: She is alert. She  exhibits normal muscle tone. Coordination  normal.  Skin: Skin is warm and dry. No rash noted.  Psychiatric: She has a normal mood and affect.  Nursing note and vitals reviewed.    ED Treatments / Results  Labs (all labs ordered are listed, but only abnormal results are displayed) Labs Reviewed  COMPREHENSIVE METABOLIC PANEL - Abnormal; Notable for the following:       Result Value   Chloride 99 (*)    Glucose, Bld 197 (*)    All other components within normal limits  CBC WITH DIFFERENTIAL/PLATELET - Abnormal; Notable for the following:    WBC 22.2 (*)    Neutro Abs 17.9 (*)    Monocytes Absolute 2.0 (*)    All other components within normal limits  LIPASE, BLOOD    EKG  EKG Interpretation None       Radiology Ct Abdomen Pelvis W Contrast  Result Date: 03/15/2017 CLINICAL DATA:  Nausea and non bilious vomiting. EXAM: CT ABDOMEN AND PELVIS WITH CONTRAST TECHNIQUE: Multidetector CT imaging of the abdomen and pelvis was performed using the standard protocol following bolus administration of intravenous contrast. CONTRAST:  157mL ISOVUE-300 IOPAMIDOL (ISOVUE-300) INJECTION 61% COMPARISON:  None. FINDINGS: Lower chest: Right middle and lower lobe atelectasis. No confluent consolidation. No pleural fluid. Hepatobiliary: Mild hepatic steatosis. No focal lesion. Clips in the gallbladder fossa postcholecystectomy. No biliary dilatation. Pancreas: No ductal dilatation or inflammation. Spleen: Normal in size without focal abnormality. Adrenals/Urinary Tract: Normal adrenal glands. No hydronephrosis or perinephric edema. Homogeneous renal enhancement with symmetric excretion on delayed phase imaging. Urinary bladder is physiologically distended, no bladder wall thickening. Stomach/Bowel: The stomach is nondistended. No gastric wall thickening. No small bowel dilatation or inflammation. No obstruction. The appendix is normal. Small to moderate colonic stool burden without colonic wall thickening or inflammation. Vascular/Lymphatic: Small  mesenteric nodes in the left upper abdomen with adjacent mesenteric edema. Largest node measures 7 mm. Minimal aorto bi-iliac atherosclerosis. No pelvic adenopathy. Reproductive: Status post hysterectomy. No adnexal masses. Other: Mild mesenteric edema in the left mid abdomen with adjacent prominent nodes. Bowel in this region is normal. No ascites or free air. No intra-abdominal abscess. Tiny fat containing umbilical hernia. Musculoskeletal: There are no acute or suspicious osseous abnormalities. IMPRESSION: 1. Mild mesenteric edema with prominent nodes in the left upper abdomen suggesting mesenteric adenitis/panniculitis. Regional bowel loops are normal. No bowel inflammation. 2. Hepatic steatosis. 3. Minimal aorta bi-iliac atherosclerosis. Aortic Atherosclerosis (ICD10-I70.0). Electronically Signed   By: Jeb Levering M.D.   On: 03/15/2017 00:24    Procedures Procedures (including critical care time)  Medications Ordered in ED Medications  sodium chloride 0.9 % bolus 1,000 mL (0 mLs Intravenous Stopped 03/14/17 1906)  hydrocortisone sodium succinate (SOLU-CORTEF) injection 200 mg (200 mg Intravenous Given 03/14/17 2011)  diphenhydrAMINE (BENADRYL) capsule 50 mg ( Oral See Alternative 03/14/17 2300)    Or  diphenhydrAMINE (BENADRYL) injection 50 mg (50 mg Intravenous Given 03/14/17 2300)  iopamidol (ISOVUE-300) 61 % injection (100 mLs  Contrast Given 03/15/17 0003)  sodium chloride 0.9 % bolus 1,000 mL (1,000 mLs Intravenous New Bag/Given 03/14/17 2012)  famotidine (PEPCID) IVPB 20 mg premix (0 mg Intravenous Stopped 03/14/17 2226)     Initial Impression / Assessment and Plan / ED Course  I have reviewed the triage vital signs and the nursing notes.  Pertinent labs & imaging results that were available during my care of the patient were reviewed by me and considered in my medical decision making (see chart  for details).     Patient presents to ED for acute onset evaluation of nausea and vomiting.  She denies any abdominal pain. She was evaluated in ED last week for similar symptoms. She did have negative workup at that time. She does have a history of hyperglycemia but no formal diagnosis of diabetes or any medications. On physical exam she has no abdominal tenderness to palpation. She is afebrile with no history of fever. Vital signs otherwise normal. CMP, lipase unremarkable. CBC did show an increase in elevation in WBC count from previous visit from 14 to 22. Nausea controlled here with Zofran. CT of the abdomen and pelvis will be obtained to evaluate for this elevated WBC count.  CT of the abdomen and pelvis was obtained after radiation protocol for patient's allergy to contrast dye. Showed evidence of mesenteric adenitis. Will encourage symptomatic treatment and follow-up with PCP for further evaluation. She states that she has nausea medication at home and declines any pain medication because she does not have any abdominal pain. Patient appears stable for discharge at this time. Strict return precautions given.  Final Clinical Impressions(s) / ED Diagnoses   Final diagnoses:  Non-intractable vomiting with nausea, unspecified vomiting type  Mesenteric adenitis    New Prescriptions New Prescriptions   No medications on file     Delia Heady, PA-C 03/15/17 [id number]    LongWonda Olds, MD 03/15/17 1102

## 2017-03-14 NOTE — ED Triage Notes (Signed)
Per EMS: pt from PCP office with N/V starting after being a doctors appointment for her son; pt given 4mg  zofran ODT in route

## 2017-03-15 ENCOUNTER — Emergency Department (HOSPITAL_COMMUNITY): Payer: Medicare Other

## 2017-03-15 DIAGNOSIS — R1110 Vomiting, unspecified: Secondary | ICD-10-CM | POA: Diagnosis not present

## 2017-03-15 DIAGNOSIS — R112 Nausea with vomiting, unspecified: Secondary | ICD-10-CM | POA: Diagnosis not present

## 2017-03-15 NOTE — ED Notes (Signed)
Pt departed in NAD, refused use of wheelchair.  

## 2017-03-15 NOTE — Discharge Instructions (Signed)
Take nausea medication as needed. Follow-up with your PCP for further evaluation. Return to ED for worsening abdominal pain, increased vomiting, vomiting blood, fevers.

## 2017-03-16 DIAGNOSIS — I10 Essential (primary) hypertension: Secondary | ICD-10-CM | POA: Diagnosis not present

## 2017-03-16 DIAGNOSIS — R110 Nausea: Secondary | ICD-10-CM | POA: Diagnosis not present

## 2017-03-16 DIAGNOSIS — I880 Nonspecific mesenteric lymphadenitis: Secondary | ICD-10-CM | POA: Diagnosis not present

## 2017-03-16 DIAGNOSIS — R739 Hyperglycemia, unspecified: Secondary | ICD-10-CM | POA: Diagnosis not present

## 2017-03-19 ENCOUNTER — Ambulatory Visit (INDEPENDENT_AMBULATORY_CARE_PROVIDER_SITE_OTHER): Payer: Self-pay | Admitting: Internal Medicine

## 2017-03-20 ENCOUNTER — Encounter (INDEPENDENT_AMBULATORY_CARE_PROVIDER_SITE_OTHER): Payer: Self-pay | Admitting: Internal Medicine

## 2017-03-21 ENCOUNTER — Ambulatory Visit (HOSPITAL_COMMUNITY): Payer: Medicare Other | Admitting: Licensed Clinical Social Worker

## 2017-05-28 DIAGNOSIS — H2513 Age-related nuclear cataract, bilateral: Secondary | ICD-10-CM | POA: Diagnosis not present

## 2017-05-28 DIAGNOSIS — H40023 Open angle with borderline findings, high risk, bilateral: Secondary | ICD-10-CM | POA: Diagnosis not present

## 2017-05-28 DIAGNOSIS — H25013 Cortical age-related cataract, bilateral: Secondary | ICD-10-CM | POA: Diagnosis not present

## 2017-05-28 DIAGNOSIS — H5340 Unspecified visual field defects: Secondary | ICD-10-CM | POA: Diagnosis not present

## 2017-05-28 DIAGNOSIS — H40053 Ocular hypertension, bilateral: Secondary | ICD-10-CM | POA: Diagnosis not present

## 2017-05-29 ENCOUNTER — Encounter (HOSPITAL_COMMUNITY): Payer: Self-pay | Admitting: Psychiatry

## 2017-05-29 ENCOUNTER — Ambulatory Visit (INDEPENDENT_AMBULATORY_CARE_PROVIDER_SITE_OTHER): Payer: Medicare Other | Admitting: Psychiatry

## 2017-05-29 DIAGNOSIS — F4310 Post-traumatic stress disorder, unspecified: Secondary | ICD-10-CM | POA: Diagnosis not present

## 2017-05-29 DIAGNOSIS — Z736 Limitation of activities due to disability: Secondary | ICD-10-CM

## 2017-05-29 DIAGNOSIS — Z813 Family history of other psychoactive substance abuse and dependence: Secondary | ICD-10-CM

## 2017-05-29 DIAGNOSIS — Z811 Family history of alcohol abuse and dependence: Secondary | ICD-10-CM

## 2017-05-29 DIAGNOSIS — F515 Nightmare disorder: Secondary | ICD-10-CM | POA: Diagnosis not present

## 2017-05-29 DIAGNOSIS — Z87891 Personal history of nicotine dependence: Secondary | ICD-10-CM | POA: Diagnosis not present

## 2017-05-29 DIAGNOSIS — F332 Major depressive disorder, recurrent severe without psychotic features: Secondary | ICD-10-CM

## 2017-05-29 DIAGNOSIS — R197 Diarrhea, unspecified: Secondary | ICD-10-CM | POA: Diagnosis not present

## 2017-05-29 DIAGNOSIS — R109 Unspecified abdominal pain: Secondary | ICD-10-CM | POA: Diagnosis not present

## 2017-05-29 DIAGNOSIS — Z818 Family history of other mental and behavioral disorders: Secondary | ICD-10-CM | POA: Diagnosis not present

## 2017-05-29 DIAGNOSIS — Z62810 Personal history of physical and sexual abuse in childhood: Secondary | ICD-10-CM

## 2017-05-29 DIAGNOSIS — D8989 Other specified disorders involving the immune mechanism, not elsewhere classified: Secondary | ICD-10-CM | POA: Diagnosis not present

## 2017-05-29 DIAGNOSIS — Z560 Unemployment, unspecified: Secondary | ICD-10-CM

## 2017-05-29 DIAGNOSIS — F419 Anxiety disorder, unspecified: Secondary | ICD-10-CM

## 2017-05-29 DIAGNOSIS — R450 Nervousness: Secondary | ICD-10-CM

## 2017-05-29 DIAGNOSIS — F4312 Post-traumatic stress disorder, chronic: Secondary | ICD-10-CM

## 2017-05-29 MED ORDER — AMPHETAMINE-DEXTROAMPHETAMINE 20 MG PO TABS: 20.0000 mg | ORAL_TABLET | Freq: Two times a day (BID) | ORAL | 0 refills | Status: DC

## 2017-05-29 MED ORDER — PRAZOSIN HCL 2 MG PO CAPS: 2.0000 mg | ORAL_CAPSULE | Freq: Every day | ORAL | 2 refills | Status: DC

## 2017-05-29 MED ORDER — DULOXETINE HCL 60 MG PO CPEP: 60.0000 mg | ORAL_CAPSULE | Freq: Two times a day (BID) | ORAL | 2 refills | Status: DC

## 2017-05-29 MED ORDER — AMPHETAMINE-DEXTROAMPHETAMINE 20 MG PO TABS: 20.0000 mg | ORAL_TABLET | Freq: Three times a day (TID) | ORAL | 0 refills | Status: DC

## 2017-05-29 MED ORDER — AMPHETAMINE-DEXTROAMPHETAMINE 20 MG PO TABS
20.0000 mg | ORAL_TABLET | Freq: Two times a day (BID) | ORAL | 0 refills | Status: DC
Start: 2017-05-29 — End: 2017-09-03
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
## 2017-05-30 ENCOUNTER — Ambulatory Visit (HOSPITAL_COMMUNITY): Payer: Self-pay | Admitting: Psychiatry

## 2017-05-31 DIAGNOSIS — X500XXA Overexertion from strenuous movement or load, initial encounter: Secondary | ICD-10-CM | POA: Diagnosis not present

## 2017-05-31 DIAGNOSIS — I10 Essential (primary) hypertension: Secondary | ICD-10-CM | POA: Diagnosis not present

## 2017-05-31 DIAGNOSIS — E119 Type 2 diabetes mellitus without complications: Secondary | ICD-10-CM | POA: Diagnosis not present

## 2017-05-31 DIAGNOSIS — M25552 Pain in left hip: Secondary | ICD-10-CM | POA: Diagnosis not present

## 2017-05-31 DIAGNOSIS — S73102A Unspecified sprain of left hip, initial encounter: Secondary | ICD-10-CM | POA: Diagnosis not present

## 2017-05-31 DIAGNOSIS — M797 Fibromyalgia: Secondary | ICD-10-CM | POA: Diagnosis not present

## 2017-05-31 DIAGNOSIS — Z8673 Personal history of transient ischemic attack (TIA), and cerebral infarction without residual deficits: Secondary | ICD-10-CM | POA: Diagnosis not present

## 2017-05-31 DIAGNOSIS — Z87891 Personal history of nicotine dependence: Secondary | ICD-10-CM | POA: Diagnosis not present

## 2017-05-31 DIAGNOSIS — Z79899 Other long term (current) drug therapy: Secondary | ICD-10-CM | POA: Diagnosis not present

## 2017-06-20 ENCOUNTER — Ambulatory Visit (HOSPITAL_COMMUNITY): Payer: Medicare Other | Admitting: Licensed Clinical Social Worker

## 2017-07-11 DIAGNOSIS — K754 Autoimmune hepatitis: Secondary | ICD-10-CM | POA: Diagnosis not present

## 2017-07-11 DIAGNOSIS — E1165 Type 2 diabetes mellitus with hyperglycemia: Secondary | ICD-10-CM | POA: Diagnosis not present

## 2017-07-11 DIAGNOSIS — I10 Essential (primary) hypertension: Secondary | ICD-10-CM | POA: Diagnosis not present

## 2017-07-19 ENCOUNTER — Encounter: Payer: Medicare Other | Attending: Pulmonary Disease | Admitting: Dietician

## 2017-07-19 ENCOUNTER — Encounter: Payer: Self-pay | Admitting: Dietician

## 2017-07-19 DIAGNOSIS — Z713 Dietary counseling and surveillance: Secondary | ICD-10-CM | POA: Diagnosis not present

## 2017-07-19 DIAGNOSIS — E119 Type 2 diabetes mellitus without complications: Secondary | ICD-10-CM | POA: Insufficient documentation

## 2017-07-19 DIAGNOSIS — E118 Type 2 diabetes mellitus with unspecified complications: Secondary | ICD-10-CM

## 2017-07-19 NOTE — Progress Notes (Signed)

## 2017-07-25 DIAGNOSIS — H4089 Other specified glaucoma: Secondary | ICD-10-CM | POA: Diagnosis not present

## 2017-07-25 DIAGNOSIS — H2513 Age-related nuclear cataract, bilateral: Secondary | ICD-10-CM | POA: Diagnosis not present

## 2017-07-26 ENCOUNTER — Ambulatory Visit: Payer: Self-pay

## 2017-08-02 ENCOUNTER — Ambulatory Visit: Payer: Self-pay

## 2017-08-13 DIAGNOSIS — L299 Pruritus, unspecified: Secondary | ICD-10-CM | POA: Diagnosis not present

## 2017-08-13 DIAGNOSIS — E1140 Type 2 diabetes mellitus with diabetic neuropathy, unspecified: Secondary | ICD-10-CM | POA: Diagnosis not present

## 2017-08-13 DIAGNOSIS — R234 Changes in skin texture: Secondary | ICD-10-CM | POA: Diagnosis not present

## 2017-08-16 ENCOUNTER — Ambulatory Visit: Payer: Self-pay

## 2017-08-23 ENCOUNTER — Ambulatory Visit: Payer: Self-pay

## 2017-08-29 ENCOUNTER — Ambulatory Visit (HOSPITAL_COMMUNITY): Payer: Self-pay | Admitting: Psychiatry

## 2017-09-03 ENCOUNTER — Encounter (HOSPITAL_COMMUNITY): Payer: Self-pay | Admitting: Psychiatry

## 2017-09-03 ENCOUNTER — Ambulatory Visit (INDEPENDENT_AMBULATORY_CARE_PROVIDER_SITE_OTHER): Payer: Medicare Other | Admitting: Psychiatry

## 2017-09-03 VITALS — BP 130/88 | HR 92 | Ht 60.0 in | Wt 205.0 lb

## 2017-09-03 DIAGNOSIS — Z560 Unemployment, unspecified: Secondary | ICD-10-CM | POA: Diagnosis not present

## 2017-09-03 DIAGNOSIS — F900 Attention-deficit hyperactivity disorder, predominantly inattentive type: Secondary | ICD-10-CM

## 2017-09-03 DIAGNOSIS — Z62810 Personal history of physical and sexual abuse in childhood: Secondary | ICD-10-CM

## 2017-09-03 DIAGNOSIS — Z811 Family history of alcohol abuse and dependence: Secondary | ICD-10-CM | POA: Diagnosis not present

## 2017-09-03 DIAGNOSIS — Z87891 Personal history of nicotine dependence: Secondary | ICD-10-CM | POA: Diagnosis not present

## 2017-09-03 DIAGNOSIS — Z818 Family history of other mental and behavioral disorders: Secondary | ICD-10-CM

## 2017-09-03 DIAGNOSIS — F515 Nightmare disorder: Secondary | ICD-10-CM | POA: Diagnosis not present

## 2017-09-03 DIAGNOSIS — Z736 Limitation of activities due to disability: Secondary | ICD-10-CM | POA: Diagnosis not present

## 2017-09-03 DIAGNOSIS — F4310 Post-traumatic stress disorder, unspecified: Secondary | ICD-10-CM

## 2017-09-03 DIAGNOSIS — D8989 Other specified disorders involving the immune mechanism, not elsewhere classified: Secondary | ICD-10-CM | POA: Diagnosis not present

## 2017-09-03 MED ORDER — AMPHETAMINE-DEXTROAMPHETAMINE 20 MG PO TABS: 20.0000 mg | ORAL_TABLET | Freq: Two times a day (BID) | ORAL | 0 refills | Status: DC

## 2017-09-03 MED ORDER — DULOXETINE HCL 60 MG PO CPEP: 60.0000 mg | ORAL_CAPSULE | Freq: Two times a day (BID) | ORAL | 2 refills | Status: DC

## 2017-09-03 MED ORDER — PRAZOSIN HCL 2 MG PO CAPS: 2.0000 mg | ORAL_CAPSULE | Freq: Every day | ORAL | 2 refills | Status: DC
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
## 2017-10-18 DIAGNOSIS — E119 Type 2 diabetes mellitus without complications: Secondary | ICD-10-CM | POA: Diagnosis not present

## 2017-10-18 DIAGNOSIS — H11423 Conjunctival edema, bilateral: Secondary | ICD-10-CM | POA: Diagnosis not present

## 2017-10-18 DIAGNOSIS — H40023 Open angle with borderline findings, high risk, bilateral: Secondary | ICD-10-CM | POA: Diagnosis not present

## 2017-10-18 DIAGNOSIS — Z7984 Long term (current) use of oral hypoglycemic drugs: Secondary | ICD-10-CM | POA: Diagnosis not present

## 2017-10-18 DIAGNOSIS — H21233 Degeneration of iris (pigmentary), bilateral: Secondary | ICD-10-CM | POA: Diagnosis not present

## 2017-10-22 DIAGNOSIS — H5213 Myopia, bilateral: Secondary | ICD-10-CM | POA: Diagnosis not present

## 2017-10-29 ENCOUNTER — Other Ambulatory Visit: Payer: Self-pay

## 2017-10-29 ENCOUNTER — Emergency Department (HOSPITAL_COMMUNITY)
Admission: EM | Admit: 2017-10-29 | Discharge: 2017-10-29 | Disposition: A | Discharge status: Home / Self Care | Payer: Medicare Other | Attending: Emergency Medicine | Admitting: Emergency Medicine

## 2017-10-29 ENCOUNTER — Encounter (HOSPITAL_COMMUNITY): Payer: Self-pay | Admitting: Emergency Medicine

## 2017-10-29 DIAGNOSIS — R112 Nausea with vomiting, unspecified: Secondary | ICD-10-CM | POA: Diagnosis not present

## 2017-10-29 DIAGNOSIS — I959 Hypotension, unspecified: Secondary | ICD-10-CM | POA: Diagnosis not present

## 2017-10-29 DIAGNOSIS — Z79899 Other long term (current) drug therapy: Secondary | ICD-10-CM | POA: Insufficient documentation

## 2017-10-29 DIAGNOSIS — K754 Autoimmune hepatitis: Secondary | ICD-10-CM | POA: Insufficient documentation

## 2017-10-29 DIAGNOSIS — Z7982 Long term (current) use of aspirin: Secondary | ICD-10-CM | POA: Diagnosis not present

## 2017-10-29 DIAGNOSIS — Z7984 Long term (current) use of oral hypoglycemic drugs: Secondary | ICD-10-CM | POA: Diagnosis not present

## 2017-10-29 DIAGNOSIS — R197 Diarrhea, unspecified: Secondary | ICD-10-CM | POA: Diagnosis not present

## 2017-10-29 DIAGNOSIS — R531 Weakness: Secondary | ICD-10-CM

## 2017-10-29 DIAGNOSIS — Z91040 Latex allergy status: Secondary | ICD-10-CM | POA: Insufficient documentation

## 2017-10-29 DIAGNOSIS — K2970 Gastritis, unspecified, without bleeding: Secondary | ICD-10-CM | POA: Diagnosis not present

## 2017-10-29 DIAGNOSIS — Z87891 Personal history of nicotine dependence: Secondary | ICD-10-CM | POA: Diagnosis not present

## 2017-10-29 DIAGNOSIS — I10 Essential (primary) hypertension: Secondary | ICD-10-CM | POA: Diagnosis not present

## 2017-10-29 DIAGNOSIS — R1110 Vomiting, unspecified: Secondary | ICD-10-CM | POA: Diagnosis present

## 2017-10-29 LAB — COMPREHENSIVE METABOLIC PANEL
ALT: 21 U/L (ref 14–54)
ANION GAP: 7 (ref 5–15)
AST: 25 U/L (ref 15–41)
Albumin: 3.8 g/dL (ref 3.5–5.0)
Alkaline Phosphatase: 89 U/L (ref 38–126)
BUN: 16 mg/dL (ref 6–20)
CO2: 25 mmol/L (ref 22–32)
Calcium: 8.9 mg/dL (ref 8.9–10.3)
Chloride: 107 mmol/L (ref 101–111)
Creatinine, Ser: 0.71 mg/dL (ref 0.44–1.00)
GFR calc non Af Amer: >60 mL/min (ref >60)
Glucose, Bld: 186 mg/dL — ABNORMAL HIGH (ref 65–99)
POTASSIUM: 4.7 mmol/L (ref 3.5–5.1)
SODIUM: 139 mmol/L (ref 135–145)
Total Bilirubin: 0.8 mg/dL (ref 0.3–1.2)
Total Protein: 7.6 g/dL (ref 6.5–8.1)

## 2017-10-29 LAB — CBC WITH DIFFERENTIAL/PLATELET
BASOS ABS: 0 10*3/uL (ref 0.0–0.1)
Basophils Relative: 0 %
EOS PCT: 0 %
Eosinophils Absolute: 0.1 10*3/uL (ref 0.0–0.7)
HCT: 40 % (ref 36.0–46.0)
HEMOGLOBIN: 13.2 g/dL (ref 12.0–15.0)
LYMPHS PCT: 4 %
Lymphs Abs: 0.8 10*3/uL (ref 0.7–4.0)
MCH: 30.3 pg (ref 26.0–34.0)
MCHC: 33 g/dL (ref 30.0–36.0)
MCV: 91.7 fL (ref 78.0–100.0)
Monocytes Absolute: 1.2 10*3/uL — ABNORMAL HIGH (ref 0.1–1.0)
Monocytes Relative: 6 %
NEUTROS PCT: 90 %
Neutro Abs: 16.9 10*3/uL — ABNORMAL HIGH (ref 1.7–7.7)
PLATELETS: 272 10*3/uL (ref 150–400)
RBC: 4.36 MIL/uL (ref 3.87–5.11)
RDW: 12.7 % (ref 11.5–15.5)
WBC: 19 10*3/uL — AB (ref 4.0–10.5)

## 2017-10-29 LAB — TROPONIN I

## 2017-10-29 LAB — LIPASE, BLOOD: Lipase: 31 U/L (ref 11–51)

## 2017-10-29 LAB — LACTIC ACID, PLASMA: Lactic Acid, Venous: 1.5 mmol/L (ref 0.5–1.9)

## 2017-10-29 MED ORDER — SODIUM CHLORIDE 0.9 % IV BOLUS
1000.0000 mL | Freq: Once | INTRAVENOUS | Status: AC
  Administered 2017-10-29: 1000 mL via INTRAVENOUS

## 2017-10-29 MED ORDER — ONDANSETRON HCL 4 MG/2ML IJ SOLN
4.0000 mg | Freq: Once | INTRAMUSCULAR | Status: AC
  Administered 2017-10-29: 4 mg via INTRAVENOUS
  Filled 2017-10-29: qty 2

## 2017-10-29 MED ORDER — ONDANSETRON HCL 4 MG PO TABS: 4.0000 mg | ORAL_TABLET | Freq: Four times a day (QID) | ORAL | 0 refills | Status: DC

## 2017-10-29 NOTE — ED Provider Notes (Signed)
[facility] EMERGENCY DEPARTMENT Provider Note   CSN: [id number] Arrival date & time: 10/29/17  1945     History   Chief Complaint Chief Complaint  Patient presents with  . Emesis    HPI Lindsay Walsh is a 47 y.o. female.  She has a history of autoimmune hepatitis.  She states for the past year or so she has had episodes of nausea vomiting diarrhea that occur every 4 to 6 weeks.  This started again today where she had multiple episodes of vomiting diarrhea and was generalized weak.  She was laying in the shower and was unable to get up off the floor.  Family relates that she has not taken her liver medication for a week, this is probably her Imuran.  When I asked her what her primary care doctor thinks of these episodes she said she did not know.  She remembers going to [facility] last year and them telling her that she had enlarged lymph nodes.  She is not sure that she had a fever but she states she is felt hot and cold.  There is no chest pain or shortness of breath.  No abdominal pain.  No urinary symptoms.  The history is provided by the patient and a relative.  Emesis   This is a recurrent problem. The current episode started 6 to 12 hours ago. The problem occurs 5 to 10 times per day. The problem has not changed since onset.The emesis has an appearance of stomach contents. There has been no fever. Associated symptoms include chills, diarrhea and sweats. Pertinent negatives include no abdominal pain, no arthralgias, no cough, no fever, no headaches and no myalgias.    Past Medical History:  Diagnosis Date  . Angina    took SL nitro one week ago  . Arthritis    back  . Autoimmune hepatitis ([place])   . Chest pain    Normal stress echo in 2011; PVCs; pedal edema  . CVA (cerebral infarction)   . Depression   . Diabetes mellitus without complication ([place])   . Dyspareunia 05/06/2014  . Dysrhythmia    palpatations  . Fasting hyperglycemia   . Fibromyalgia   . Gastroesophageal  reflux disease    Chronic abdominal pain; gastroparesis; globus hystericus; irritable bowel syndrome  . Hypertension   . Irritable bowel syndrome   . Narcotic dependence ([place])   . Overweight(278.02)   . PTSD (post-traumatic stress disorder)   . Pulmonary nodule   . Shortness of breath    with exertion  . Sleep apnea   . Sleep apnea    on CPAP machine  . Tobacco abuse    one pack per day; 35 pack years    Patient Active Problem List   Diagnosis Date Noted  . Essential hypertension, malignant 03/07/2015  . CVA (cerebral infarction) 03/05/2015  . Dyspareunia 05/06/2014  . Obesity 09/30/2013  . Bilateral hip pain 07/16/2013  . Chronic pain syndrome 07/16/2013  . Degenerative disc disease, lumbar 07/16/2013  . Greater trochanteric bursitis of both hips 07/16/2013  . Nightmares associated with chronic post-traumatic stress disorder 10/18/2012  . Sprain of foot 03/14/2012  . Weakness 01/29/2012  . Hepatitis, autoimmune ([place]) 01/29/2012  . IBS (irritable bowel syndrome) 08/01/2011  . Gastroesophageal reflux disease   . Hypertension   . Depression   . Chest pain   . Fasting hyperglycemia   . TOBACCO ABUSE 04/28/2010  . NARCOTIC ABUSE 04/28/2010  . PULMONARY NODULE 04/28/2010  . AUTOIMMUNE HEPATITIS  04/28/2010  . Myalgia and myositis 01/06/2009    Past Surgical History:  Procedure Laterality Date  . ABDOMINAL HYSTERECTOMY    . BACK SURGERY    . BLADDER SUSPENSION  09/12/2011   Procedure: TRANSVAGINAL TAPE (TVT) PROCEDURE;  Surgeon: [name], MD;  Location: [facility];  Service: Urology;  Laterality: N/A;  . CESAREAN SECTION     X3  . CHOLECYSTECTOMY    . ESOPHAGOGASTRODUODENOSCOPY ENDOSCOPY     "throat stretched" per pt.  Marland Kitchen LIVER BIOPSY     x4  . LUMBAR EPIDURAL INJECTION  01/2012  . TOTAL ABDOMINAL HYSTERECTOMY W/ BILATERAL SALPINGOOPHORECTOMY    . TUBAL LIGATION     Bilateral  . UPPER GASTROINTESTINAL ENDOSCOPY  09/13/2010     OB History    Gravida  3    Para  3   Term      Preterm      AB      Living  3     SAB      TAB      Ectopic      Multiple      Live Births               Home Medications    Prior to Admission medications   Medication Sig Start Date End Date Taking? Authorizing Provider  ammonium lactate (AMLACTIN) 12 % cream  12/14/16   [provider]  amphetamine-dextroamphetamine (ADDERALL) 20 MG tablet Take 1 tablet (20 mg total) by mouth 2 (two) times daily. 09/03/17 09/03/18  Cloria Spring, MD  amphetamine-dextroamphetamine (ADDERALL) 20 MG tablet Take 1 tablet (20 mg total) by mouth 2 (two) times daily. 09/03/17 09/03/18  Cloria Spring, MD  amphetamine-dextroamphetamine (ADDERALL) 20 MG tablet Take 1 tablet (20 mg total) by mouth 2 (two) times daily. 09/03/17 09/03/18  Cloria Spring, MD  Ascorbic Acid (VITAMIN C) 1000 MG tablet Take 1,000 mg by mouth every morning.     [provider]  aspirin 325 MG EC tablet Take 325 mg by mouth daily.    [provider]  atorvastatin (LIPITOR) 80 MG tablet Take 1 tablet by mouth daily. 12/17/16   [provider]  azaTHIOprine (IMURAN) 50 MG tablet take 1 tablet once daily 12/14/16   [name], Mechele Dawley, MD  calcium-vitamin D (OS-CAL 500 + D) 500-200 MG-UNIT per tablet Take 1 tablet by mouth 2 (two) times daily.     [provider]  CIPRODEX otic suspension Place 2 drops into the left ear as needed. Reported on 10/04/2015 02/19/15   [provider]  dicyclomine (BENTYL) 10 MG capsule Take 1 capsule (10 mg total) by mouth daily as needed. For cramps 03/08/15   Sinda Du, MD  DULoxetine (CYMBALTA) 60 MG capsule Take 1 capsule (60 mg total) by mouth 2 (two) times daily. 09/03/17   Cloria Spring, MD  esomeprazole (NEXIUM) 40 MG capsule take 1 capsule by mouth every morning 11/24/16   Setzer, Rona Ravens, NP  fexofenadine (ALLEGRA) 60 MG tablet Take 1 tablet by mouth 2 (two) times daily as needed. 06/27/10   [provider]    furosemide (LASIX) 40 MG tablet Take 40 mg by mouth every other day.     [provider]  gabapentin (NEURONTIN) 300 MG capsule Take 300 mg by mouth at bedtime as needed (for pain).     [provider]  GARLIC PO Take 3,810 mg by mouth daily.     [provider]  hydrALAZINE (APRESOLINE) 50 MG tablet Take 1 tablet (50 mg total) by mouth every 8 (eight) hours. 03/08/15   Sinda Du, MD  isosorbide mononitrate (IMDUR) 30 MG 24 hr tablet TAKE ONE (1) TABLET EACH DAY 03/09/14   Lendon Colonel, NP  lisinopril (PRINIVIL,ZESTRIL) 40 MG tablet Take 40 mg by mouth daily.  09/23/12   [provider]  loperamide (IMODIUM) 2 MG capsule Take 1 capsule (2 mg total) by mouth 4 (four) times daily as needed for diarrhea or loose stools. 03/07/17   Long, Wonda Olds, MD  metFORMIN (GLUCOPHAGE) 500 MG tablet  05/23/17   [provider]  metoprolol succinate (TOPROL-XL) 100 MG 24 hr tablet Take 100 mg in the am, and take 50 mg (1/2 tablet) in the pm for a total of 150 mg daily 12/26/16   Josue Hector, MD  Multiple Vitamins-Minerals (CENTRUM) tablet Take 1 tablet by mouth daily.    [provider]  ondansetron (ZOFRAN ODT) 4 MG disintegrating tablet Take 1 tablet (4 mg total) by mouth every 8 (eight) hours as needed for nausea or vomiting. 03/07/17   Long, Wonda Olds, MD  pramipexole (MIRAPEX) 0.25 MG tablet Take 0.25 mg by mouth at bedtime as needed. For restless legs    [provider]  prazosin (MINIPRESS) 2 MG capsule Take 1 capsule (2 mg total) by mouth at bedtime. 09/03/17   Cloria Spring, MD  PROAIR HFA 108 [phone] BASE) MCG/ACT inhaler Inhale 1-2 puffs into the lungs every 6 (six) hours as needed for wheezing or shortness of breath.  12/16/12   [provider]  Probiotic Product (RESTORA PO) Take by mouth daily.     [provider]    Family History Family History  Problem Relation Age of Onset  . Diabetes Mother   . Hypertension  Mother   . Anxiety disorder Mother   . Depression Mother   . Drug abuse Mother   . Heart disease Father   . Diabetes Father   . Anxiety disorder Father   . Alcohol abuse Father   . Depression Father   . OCD Father   . Thyroid disease Sister   . Healthy Brother   . Healthy Daughter   . Healthy Son   . ADD / ADHD Son   . Alcohol abuse Paternal Grandfather   . Depression Paternal Grandfather   . Cancer Paternal Grandfather        lung,skin  . Tuberculosis Paternal Grandfather   . Seizures Paternal Grandmother   . Lupus Paternal Grandmother   . ADD / ADHD Son   . Anesthesia problems Neg Hx   . Malignant hyperthermia Neg Hx   . Pseudochol deficiency Neg Hx   . Hypotension Neg Hx   . Bipolar disorder Neg Hx   . Dementia Neg Hx   . Paranoid behavior Neg Hx   . Schizophrenia Neg Hx   . Sexual abuse Neg Hx   . Physical abuse Neg Hx     Social History Social History   Tobacco Use  . Smoking status: Former Smoker    Packs/day: 1.50    Years: 25.00    Pack years: 37.50    Types: Cigarettes    Last attempt to quit: 04/25/2013    Years since quitting: 4.5  . Smokeless tobacco: Never Used  . Tobacco comment: 15-20 cigs a day as of 11/12/2012  Substance Use Topics  . Alcohol use: No    Alcohol/week: 0.0 oz  .  Drug use: No     Allergies   Doxycycline; Fentanyl; Ceftin [cefuroxime axetil]; Iohexol; Ivp dye [iodinated diagnostic agents]; Norvasc [amlodipine besylate]; Wellbutrin [bupropion hcl]; Latex; and Tape   Review of Systems Review of Systems  Constitutional: Positive for chills, diaphoresis and fatigue. Negative for fever.  HENT: Negative for ear pain and sore throat.   Eyes: Negative for discharge and visual disturbance.  Respiratory: Negative for cough and shortness of breath.   Cardiovascular: Negative for chest pain and palpitations.  Gastrointestinal: Positive for diarrhea, nausea and vomiting. Negative for abdominal pain and blood in stool.  Genitourinary:  Negative for dysuria, frequency and vaginal bleeding.  Musculoskeletal: Negative for arthralgias and myalgias.  Skin: Negative for rash.  Neurological: Positive for weakness. Negative for speech difficulty and headaches.     Physical Exam Updated Vital Signs BP 102/76 (BP Location: Right Arm)   Pulse 77   Resp 20   Wt 90.7 kg (200 lb)   SpO2 96%   BMI 39.06 kg/m   Physical Exam  Constitutional: She appears well-developed and well-nourished.  Non-toxic appearance. No distress.  HENT:  Head: Normocephalic and atraumatic.  Right Ear: External ear normal.  Left Ear: External ear normal.  Nose: Nose normal.  Mouth/Throat: Oropharynx is clear and moist. No oropharyngeal exudate.  Eyes: Conjunctivae are normal.  Neck: Normal range of motion. Neck supple.  Cardiovascular: Normal rate, regular rhythm and intact distal pulses.  No murmur heard. Pulmonary/Chest: Effort normal and breath sounds normal. No respiratory distress.  Abdominal: Soft. She exhibits no mass. There is no tenderness. There is no guarding.  Musculoskeletal: Normal range of motion. She exhibits no edema, tenderness or deformity.  Neurological: She is alert.  Skin: Skin is warm and dry. Capillary refill takes less than 2 seconds.  Psychiatric: She has a normal mood and affect.  Nursing note and vitals reviewed.    ED Treatments / Results  Labs (all labs ordered are listed, but only abnormal results are displayed) Labs Reviewed  COMPREHENSIVE METABOLIC PANEL - Abnormal; Notable for the following components:      Result Value   Glucose, Bld 186 (*)    All other components within normal limits  CBC WITH DIFFERENTIAL/PLATELET - Abnormal; Notable for the following components:   WBC 19.0 (*)    Neutro Abs 16.9 (*)    Monocytes Absolute 1.2 (*)    All other components within normal limits  LIPASE, BLOOD  TROPONIN I  LACTIC ACID, PLASMA    EKG EKG Interpretation  Date/Time:  Monday October 29 2017 20:12:50  EDT Ventricular Rate:  76 PR Interval:    QRS Duration: 92 QT Interval:  431 QTC Calculation: 485 R Axis:   79 Text Interpretation:  Sinus rhythm Nonspecific T abnrm, anterolateral leads new compared with 8/16 Confirmed by Aletta Edouard [phone]) on 10/29/2017 8:17:48 PM   Radiology No results found.  Procedures Procedures (including critical care time)  Medications Ordered in ED Medications  sodium chloride 0.9 % bolus 1,000 mL (has no administration in time range)  ondansetron (ZOFRAN) injection 4 mg (has no administration in time range)     Initial Impression / Assessment and Plan / ED Course  I have reviewed the triage vital signs and the nursing notes.  Pertinent labs & imaging results that were available during my care of the patient were reviewed by me and considered in my medical decision making (see chart for details).  Clinical Course as of Oct 30 1049  Mon Oct 29, 2017  2138 Reevaluate patient.  She is feeling better.  Labs are significant for an elevated white count of 19 but otherwise no metabolic derangement normal LFTs normal lactate.  She wants to start taking some p.o. and will order a second liter of fluid for.  She possibly could go home if she is able to finish a p.o. challenge.   [MB]  2212 Patient feeling improved and tolerating p.o.  She would like to go home and I think it is reasonable as her symptoms seem improved and she has had no more vomiting or diarrhea here.  We are prescribing her some Zofran and she understands the need to follow-up with her doctor and return if any worsening symptoms.   [MB]    Clinical Course User Index [MB] Hayden Rasmussen, MD     Final Clinical Impressions(s) / ED Diagnoses   Final diagnoses:  Nausea vomiting and diarrhea  Weakness  Hypotension, unspecified hypotension type    ED Discharge Orders        Ordered    ondansetron (ZOFRAN) 4 MG tablet  Every 6 hours     10/29/17 2141       Hayden Rasmussen,  MD 10/30/17 1051

## 2017-10-29 NOTE — Discharge Instructions (Signed)
Your evaluated in the emergency department for acute onset of nausea vomiting and diarrhea.  It was associated with general weakness.  Your lab work showed that you had an elevated white blood cell count.  He was given IV fluids and nausea medicine with improvement in your symptoms.  You should continue to stay well-hydrated and we are prescribing nausea medicine to go home with.  You can also use an antidiarrheal medicine like Imodium.  You should call your doctor for follow-up and return to the emergency department if any worsening symptoms.

## 2017-10-29 NOTE — ED Triage Notes (Signed)
Pt c/o n/v/d since noon.

## 2017-11-01 ENCOUNTER — Other Ambulatory Visit (INDEPENDENT_AMBULATORY_CARE_PROVIDER_SITE_OTHER): Payer: Self-pay | Admitting: Internal Medicine

## 2017-11-01 ENCOUNTER — Ambulatory Visit (INDEPENDENT_AMBULATORY_CARE_PROVIDER_SITE_OTHER): Payer: Medicare Other | Admitting: Internal Medicine

## 2017-11-01 ENCOUNTER — Encounter (INDEPENDENT_AMBULATORY_CARE_PROVIDER_SITE_OTHER): Payer: Self-pay | Admitting: Internal Medicine

## 2017-11-01 VITALS — BP 138/100 | HR 72 | Temp 97.9°F | Ht 60.0 in | Wt 206.7 lb

## 2017-11-01 DIAGNOSIS — R112 Nausea with vomiting, unspecified: Secondary | ICD-10-CM

## 2017-11-01 DIAGNOSIS — R6883 Chills (without fever): Secondary | ICD-10-CM

## 2017-11-01 DIAGNOSIS — D72828 Other elevated white blood cell count: Secondary | ICD-10-CM

## 2017-11-01 MED ORDER — PREDNISONE 10 MG PO TABS: 50.0000 mg | ORAL_TABLET | ORAL | 0 refills | Status: DC

## 2017-11-01 NOTE — Patient Instructions (Signed)
Labs and CT. Further recommendations to follow 

## 2017-11-01 NOTE — Addendum Note (Signed)
Addended by: Butch Penny on: 11/01/2017 11:52 AM   Modules accepted: Orders

## 2017-11-01 NOTE — Progress Notes (Signed)
Subjective:    Patient ID: Lindsay Walsh, female    DOB: 1971/05/07, 47 y.o.   MRN: [id number]  HPI Here today for f/u after recent visit to the ED for nausea, vomiting, chills, diarrhea.  Seen in the ED 10/29/2017. She was given IV fluids, labs. Hx of same that occurs about every about every 4-6 weeks.. Her WBC was elevated at 19.0 that day.  Had a similar episode in 2018 and underwent a CT  In September of 2018 which revealed. IMPRESSION: 1. Mild mesenteric edema with prominent nodes in the left upper abdomen suggesting mesenteric adenitis/panniculitis. Regional bowel loops are normal. No bowel inflammation. 2. Hepatic steatosis. 3. Minimal aorta bi-iliac atherosclerosis. Aortic Atherosclerosis Her WBC ct was elevated at that time also. Liver enzymes are always normal.  She has no GI complaints. She is 100% better.   Hx of auto immune hepatitis and is maintained on Imuran Review of Systems Past Medical History:  Diagnosis Date  . Angina    took SL nitro one week ago  . Arthritis    back  . Autoimmune hepatitis ([place])   . Chest pain    Normal stress echo in 2011; PVCs; pedal edema  . CVA (cerebral infarction)   . Depression   . Diabetes mellitus without complication ([place])   . Dyspareunia 05/06/2014  . Dysrhythmia    palpatations  . Fasting hyperglycemia   . Fibromyalgia   . Gastroesophageal reflux disease    Chronic abdominal pain; gastroparesis; globus hystericus; irritable bowel syndrome  . Hypertension   . Irritable bowel syndrome   . Narcotic dependence ([place])   . Overweight(278.02)   . PTSD (post-traumatic stress disorder)   . Pulmonary nodule   . Shortness of breath    with exertion  . Sleep apnea   . Sleep apnea    on CPAP machine  . Tobacco abuse    one pack per day; 35 pack years    Past Surgical History:  Procedure Laterality Date  . ABDOMINAL HYSTERECTOMY    . BACK SURGERY    . BLADDER SUSPENSION  09/12/2011   Procedure: TRANSVAGINAL TAPE (TVT)  PROCEDURE;  Surgeon: [name], MD;  Location: [facility];  Service: Urology;  Laterality: N/A;  . CESAREAN SECTION     X3  . CHOLECYSTECTOMY    . ESOPHAGOGASTRODUODENOSCOPY ENDOSCOPY     "throat stretched" per pt.  Marland Kitchen LIVER BIOPSY     x4  . LUMBAR EPIDURAL INJECTION  01/2012  . TOTAL ABDOMINAL HYSTERECTOMY W/ BILATERAL SALPINGOOPHORECTOMY    . TUBAL LIGATION     Bilateral  . UPPER GASTROINTESTINAL ENDOSCOPY  09/13/2010    Allergies  Allergen Reactions  . Doxycycline Shortness Of Breath and Rash  . Fentanyl Shortness Of Breath, Itching and Other (See Comments)    Heart racing, SOB  . Ceftin [Cefuroxime Axetil]   . Iohexol Other (See Comments)    Knots on body   . Ivp Dye [Iodinated Diagnostic Agents] Other (See Comments)    Knots on body  . Norvasc [Amlodipine Besylate]   . Wellbutrin [Bupropion Hcl] Other (See Comments)    unknown  . Latex Rash  . Tape Rash    Current Outpatient Medications on File Prior to Visit  Medication Sig Dispense Refill  . ammonium lactate (AMLACTIN) 12 % cream     . amphetamine-dextroamphetamine (ADDERALL) 20 MG tablet Take 1 tablet (20 mg total) by mouth 2 (two) times daily. 60 tablet 0  . amphetamine-dextroamphetamine (ADDERALL) 20  MG tablet Take 1 tablet (20 mg total) by mouth 2 (two) times daily. 60 tablet 0  . amphetamine-dextroamphetamine (ADDERALL) 20 MG tablet Take 1 tablet (20 mg total) by mouth 2 (two) times daily. 60 tablet 0  . Ascorbic Acid (VITAMIN C) 1000 MG tablet Take 1,000 mg by mouth every morning.     Marland Kitchen aspirin 325 MG EC tablet Take 325 mg by mouth daily.    Marland Kitchen atorvastatin (LIPITOR) 80 MG tablet Take 1 tablet by mouth daily.    Marland Kitchen azaTHIOprine (IMURAN) 50 MG tablet take 1 tablet once daily 30 tablet 5  . calcium-vitamin D (OS-CAL 500 + D) 500-200 MG-UNIT per tablet Take 1 tablet by mouth 2 (two) times daily.     Marland Kitchen CIPRODEX otic suspension Place 2 drops into the left ear as needed. Reported on 10/04/2015    . dicyclomine  (BENTYL) 10 MG capsule Take 1 capsule (10 mg total) by mouth daily as needed. For cramps 90 capsule 5  . DULoxetine (CYMBALTA) 60 MG capsule Take 1 capsule (60 mg total) by mouth 2 (two) times daily. 60 capsule 2  . esomeprazole (NEXIUM) 40 MG capsule take 1 capsule by mouth every morning 90 capsule 3  . fexofenadine (ALLEGRA) 60 MG tablet Take 1 tablet by mouth 2 (two) times daily as needed.    . furosemide (LASIX) 40 MG tablet Take 40 mg by mouth every other day.     . gabapentin (NEURONTIN) 300 MG capsule Take 300 mg by mouth at bedtime as needed (for pain).     Marland Kitchen GARLIC PO Take 1,610 mg by mouth daily.     . hydrALAZINE (APRESOLINE) 50 MG tablet Take 1 tablet (50 mg total) by mouth every 8 (eight) hours. 90 tablet 5  . isosorbide mononitrate (IMDUR) 30 MG 24 hr tablet TAKE ONE (1) TABLET EACH DAY 30 tablet 11  . lisinopril (PRINIVIL,ZESTRIL) 40 MG tablet Take 40 mg by mouth daily.     Marland Kitchen loperamide (IMODIUM) 2 MG capsule Take 1 capsule (2 mg total) by mouth 4 (four) times daily as needed for diarrhea or loose stools. 12 capsule 0  . metFORMIN (GLUCOPHAGE) 500 MG tablet Take 500 mg by mouth 2 (two) times daily with a meal.     . metoprolol succinate (TOPROL-XL) 100 MG 24 hr tablet Take 100 mg in the am, and take 50 mg (1/2 tablet) in the pm for a total of 150 mg daily 135 tablet 3  . Multiple Vitamins-Minerals (CENTRUM) tablet Take 1 tablet by mouth daily.    . ondansetron (ZOFRAN) 4 MG tablet Take 1 tablet (4 mg total) by mouth every 6 (six) hours. 12 tablet 0  . pramipexole (MIRAPEX) 0.25 MG tablet Take 0.25 mg by mouth at bedtime as needed. For restless legs    . prazosin (MINIPRESS) 2 MG capsule Take 1 capsule (2 mg total) by mouth at bedtime. 30 capsule 2  . PROAIR HFA 108 (90 BASE) MCG/ACT inhaler Inhale 1-2 puffs into the lungs every 6 (six) hours as needed for wheezing or shortness of breath.     . Probiotic Product (RESTORA PO) Take by mouth daily.      No current facility-administered  medications on file prior to visit.         Objective:   Physical Exam Blood pressure (!) 138/100, pulse 72, temperature 97.9 F (36.6 C), height 5' (1.524 m), weight 206 lb 11.2 oz (93.8 kg). Alert and oriented. Skin warm and dry. Oral mucosa  is moist.   . Sclera anicteric, conjunctivae is pink. Thyroid not enlarged. No cervical lymphadenopathy. Lungs clear. Heart regular rate and rhythm.  Abdomen is soft. Bowel sounds are positive. No hepatomegaly. No abdominal masses felt. No tenderness.  No edema to lower extremities.           Assessment & Plan:  Elevated WBC, Nausea,vomiting, diarrhea. Am going to get a CBC, CMET and CT abdomen/pelvis with CM.  Further recommendations to follow.

## 2017-11-02 LAB — CBC/DIFF AMBIGUOUS DEFAULT
BASOS ABS: 0 10*3/uL (ref 0.0–0.2)
Basos: 0 %
EOS (ABSOLUTE): 0.7 10*3/uL — ABNORMAL HIGH (ref 0.0–0.4)
EOS: 9 %
HEMATOCRIT: 36.1 % (ref 34.0–46.6)
HEMOGLOBIN: 12 g/dL (ref 11.1–15.9)
Immature Grans (Abs): 0 10*3/uL (ref 0.0–0.1)
Immature Granulocytes: 0 %
LYMPHS ABS: 1.7 10*3/uL (ref 0.7–3.1)
LYMPHS: 22 %
MCH: 30.1 pg (ref 26.6–33.0)
MCHC: 33.2 g/dL (ref 31.5–35.7)
MCV: 91 fL (ref 79–97)
MONOCYTES: 8 %
Monocytes Absolute: 0.6 10*3/uL (ref 0.1–0.9)
NEUTROS ABS: 4.5 10*3/uL (ref 1.4–7.0)
Neutrophils: 61 %
Platelets: 255 10*3/uL (ref 150–379)
RBC: 3.99 x10E6/uL (ref 3.77–5.28)
RDW: 13.2 % (ref 12.3–15.4)
WBC: 7.5 10*3/uL (ref 3.4–10.8)

## 2017-11-02 LAB — COMPREHENSIVE METABOLIC PANEL
ALBUMIN: 3.9 g/dL (ref 3.5–5.5)
ALT: 18 IU/L (ref 0–32)
AST: 19 IU/L (ref 0–40)
Albumin/Globulin Ratio: 1.4 (ref 1.2–2.2)
Alkaline Phosphatase: 98 IU/L (ref 39–117)
BUN / CREAT RATIO: 9 (ref 9–23)
BUN: 6 mg/dL (ref 6–24)
Bilirubin Total: 0.3 mg/dL (ref 0.0–1.2)
CO2: 24 mmol/L (ref 20–29)
CREATININE: 0.65 mg/dL (ref 0.57–1.00)
Calcium: 9.5 mg/dL (ref 8.7–10.2)
Chloride: 103 mmol/L (ref 96–106)
GFR calc non Af Amer: 106 mL/min/{1.73_m2} (ref >59)
GFR, EST AFRICAN AMERICAN: 122 mL/min/{1.73_m2} (ref >59)
GLUCOSE: 192 mg/dL — AB (ref 65–99)
Globulin, Total: 2.8 g/dL (ref 1.5–4.5)
Potassium: 4 mmol/L (ref 3.5–5.2)
Sodium: 142 mmol/L (ref 134–144)
Total Protein: 6.7 g/dL (ref 6.0–8.5)

## 2017-11-02 LAB — SPECIMEN STATUS REPORT

## 2017-11-05 ENCOUNTER — Telehealth (INDEPENDENT_AMBULATORY_CARE_PROVIDER_SITE_OTHER): Payer: Self-pay | Admitting: Internal Medicine

## 2017-11-05 DIAGNOSIS — H52223 Regular astigmatism, bilateral: Secondary | ICD-10-CM | POA: Diagnosis not present

## 2017-11-05 NOTE — Telephone Encounter (Signed)
Patient wants to know if blood work results have come back - please call at [phone]

## 2017-11-05 NOTE — Telephone Encounter (Signed)
Labs are not back yet 

## 2017-11-08 ENCOUNTER — Other Ambulatory Visit (HOSPITAL_COMMUNITY): Payer: Self-pay | Admitting: Psychiatry

## 2017-11-14 DIAGNOSIS — E1165 Type 2 diabetes mellitus with hyperglycemia: Secondary | ICD-10-CM | POA: Diagnosis not present

## 2017-11-14 DIAGNOSIS — I880 Nonspecific mesenteric lymphadenitis: Secondary | ICD-10-CM | POA: Diagnosis not present

## 2017-11-14 DIAGNOSIS — I10 Essential (primary) hypertension: Secondary | ICD-10-CM | POA: Diagnosis not present

## 2017-11-15 ENCOUNTER — Other Ambulatory Visit (INDEPENDENT_AMBULATORY_CARE_PROVIDER_SITE_OTHER): Payer: Self-pay | Admitting: Internal Medicine

## 2017-11-16 ENCOUNTER — Ambulatory Visit (HOSPITAL_COMMUNITY)
Admission: RE | Admit: 2017-11-16 | Discharge: 2017-11-16 | Disposition: A | Discharge status: Home / Self Care | Payer: Medicare Other | Source: Ambulatory Visit | Attending: Internal Medicine | Admitting: Internal Medicine

## 2017-11-16 ENCOUNTER — Encounter (HOSPITAL_COMMUNITY): Payer: Self-pay

## 2017-11-16 DIAGNOSIS — Z90710 Acquired absence of both cervix and uterus: Secondary | ICD-10-CM | POA: Diagnosis not present

## 2017-11-16 DIAGNOSIS — R197 Diarrhea, unspecified: Secondary | ICD-10-CM | POA: Diagnosis not present

## 2017-11-16 DIAGNOSIS — R112 Nausea with vomiting, unspecified: Secondary | ICD-10-CM | POA: Insufficient documentation

## 2017-11-16 DIAGNOSIS — Z9049 Acquired absence of other specified parts of digestive tract: Secondary | ICD-10-CM | POA: Diagnosis not present

## 2017-11-16 DIAGNOSIS — R6883 Chills (without fever): Secondary | ICD-10-CM | POA: Insufficient documentation

## 2017-11-16 DIAGNOSIS — D72828 Other elevated white blood cell count: Secondary | ICD-10-CM | POA: Diagnosis not present

## 2017-11-16 DIAGNOSIS — R1110 Vomiting, unspecified: Secondary | ICD-10-CM | POA: Diagnosis not present

## 2017-11-16 MED ORDER — IOPAMIDOL (ISOVUE-370) INJECTION 76%: 100.0000 mL | Freq: Once | INTRAVENOUS | Status: DC | PRN

## 2017-11-16 MED ORDER — IOPAMIDOL (ISOVUE-300) INJECTION 61%
100.0000 mL | Freq: Once | INTRAVENOUS | Status: AC | PRN
  Administered 2017-11-16: 100 mL via INTRAVENOUS

## 2017-12-04 ENCOUNTER — Ambulatory Visit (HOSPITAL_COMMUNITY): Payer: Medicare Other | Admitting: Psychiatry

## 2017-12-15 ENCOUNTER — Other Ambulatory Visit (INDEPENDENT_AMBULATORY_CARE_PROVIDER_SITE_OTHER): Payer: Self-pay | Admitting: Internal Medicine

## 2018-01-29 DIAGNOSIS — R112 Nausea with vomiting, unspecified: Secondary | ICD-10-CM | POA: Diagnosis not present

## 2018-02-04 ENCOUNTER — Telehealth (HOSPITAL_COMMUNITY): Payer: Self-pay | Admitting: *Deleted

## 2018-02-04 ENCOUNTER — Other Ambulatory Visit (HOSPITAL_COMMUNITY): Payer: Self-pay | Admitting: Psychiatry

## 2018-02-04 MED ORDER — AMPHETAMINE-DEXTROAMPHETAMINE 20 MG PO TABS: 20.0000 mg | ORAL_TABLET | Freq: Two times a day (BID) | ORAL | 0 refills | Status: DC

## 2018-02-04 NOTE — Telephone Encounter (Signed)
ordered

## 2018-02-04 NOTE — Telephone Encounter (Signed)
Dr Modesta Messing Dr [facility]rington [facility] patient called in requesting refill on Adderall next appointment is 02/12/18

## 2018-02-09 ENCOUNTER — Other Ambulatory Visit (HOSPITAL_COMMUNITY): Payer: Self-pay | Admitting: Psychiatry

## 2018-02-12 ENCOUNTER — Encounter (HOSPITAL_COMMUNITY): Payer: Self-pay | Admitting: Psychiatry

## 2018-02-12 ENCOUNTER — Ambulatory Visit (INDEPENDENT_AMBULATORY_CARE_PROVIDER_SITE_OTHER): Payer: Medicare Other | Admitting: Psychiatry

## 2018-02-12 VITALS — BP 150/102 | HR 87 | Ht 60.0 in | Wt 207.0 lb

## 2018-02-12 DIAGNOSIS — F900 Attention-deficit hyperactivity disorder, predominantly inattentive type: Secondary | ICD-10-CM | POA: Diagnosis not present

## 2018-02-12 DIAGNOSIS — F321 Major depressive disorder, single episode, moderate: Secondary | ICD-10-CM | POA: Diagnosis not present

## 2018-02-12 DIAGNOSIS — F4310 Post-traumatic stress disorder, unspecified: Secondary | ICD-10-CM | POA: Diagnosis not present

## 2018-02-12 DIAGNOSIS — F515 Nightmare disorder: Secondary | ICD-10-CM

## 2018-02-12 DIAGNOSIS — F4312 Post-traumatic stress disorder, chronic: Secondary | ICD-10-CM

## 2018-02-12 MED ORDER — PRAZOSIN HCL 2 MG PO CAPS: 2.0000 mg | ORAL_CAPSULE | Freq: Every day | ORAL | 2 refills | Status: DC

## 2018-02-12 MED ORDER — DULOXETINE HCL 60 MG PO CPEP: ORAL_CAPSULE | ORAL | 2 refills | Status: DC

## 2018-02-12 MED ORDER — AMPHETAMINE-DEXTROAMPHETAMINE 20 MG PO TABS: 20.0000 mg | ORAL_TABLET | Freq: Two times a day (BID) | ORAL | 0 refills | Status: DC
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
## 2018-02-20 ENCOUNTER — Other Ambulatory Visit: Payer: Self-pay | Admitting: Cardiovascular Disease

## 2018-03-20 DIAGNOSIS — K754 Autoimmune hepatitis: Secondary | ICD-10-CM | POA: Diagnosis not present

## 2018-03-20 DIAGNOSIS — R197 Diarrhea, unspecified: Secondary | ICD-10-CM | POA: Diagnosis not present

## 2018-03-20 DIAGNOSIS — R112 Nausea with vomiting, unspecified: Secondary | ICD-10-CM | POA: Diagnosis not present

## 2018-04-11 ENCOUNTER — Other Ambulatory Visit (HOSPITAL_COMMUNITY): Payer: Self-pay | Admitting: Pulmonary Disease

## 2018-04-11 DIAGNOSIS — Z1231 Encounter for screening mammogram for malignant neoplasm of breast: Secondary | ICD-10-CM

## 2018-04-19 ENCOUNTER — Ambulatory Visit (HOSPITAL_COMMUNITY): Payer: Self-pay

## 2018-04-25 DIAGNOSIS — K635 Polyp of colon: Secondary | ICD-10-CM | POA: Diagnosis not present

## 2018-04-25 DIAGNOSIS — K219 Gastro-esophageal reflux disease without esophagitis: Secondary | ICD-10-CM | POA: Diagnosis not present

## 2018-04-25 DIAGNOSIS — E119 Type 2 diabetes mellitus without complications: Secondary | ICD-10-CM | POA: Diagnosis not present

## 2018-04-25 DIAGNOSIS — K529 Noninfective gastroenteritis and colitis, unspecified: Secondary | ICD-10-CM | POA: Diagnosis not present

## 2018-04-25 DIAGNOSIS — Z885 Allergy status to narcotic agent status: Secondary | ICD-10-CM | POA: Diagnosis not present

## 2018-04-25 DIAGNOSIS — K754 Autoimmune hepatitis: Secondary | ICD-10-CM | POA: Diagnosis not present

## 2018-04-25 DIAGNOSIS — G4730 Sleep apnea, unspecified: Secondary | ICD-10-CM | POA: Diagnosis not present

## 2018-04-25 DIAGNOSIS — R112 Nausea with vomiting, unspecified: Secondary | ICD-10-CM | POA: Diagnosis not present

## 2018-04-25 DIAGNOSIS — Z8673 Personal history of transient ischemic attack (TIA), and cerebral infarction without residual deficits: Secondary | ICD-10-CM | POA: Diagnosis not present

## 2018-04-25 DIAGNOSIS — Z9049 Acquired absence of other specified parts of digestive tract: Secondary | ICD-10-CM | POA: Diagnosis not present

## 2018-04-25 DIAGNOSIS — I9589 Other hypotension: Secondary | ICD-10-CM | POA: Diagnosis not present

## 2018-04-25 DIAGNOSIS — Z87891 Personal history of nicotine dependence: Secondary | ICD-10-CM | POA: Diagnosis not present

## 2018-04-25 DIAGNOSIS — E039 Hypothyroidism, unspecified: Secondary | ICD-10-CM | POA: Diagnosis not present

## 2018-04-25 DIAGNOSIS — K589 Irritable bowel syndrome without diarrhea: Secondary | ICD-10-CM | POA: Diagnosis not present

## 2018-04-25 DIAGNOSIS — Z7984 Long term (current) use of oral hypoglycemic drugs: Secondary | ICD-10-CM | POA: Diagnosis not present

## 2018-04-25 DIAGNOSIS — Z91048 Other nonmedicinal substance allergy status: Secondary | ICD-10-CM | POA: Diagnosis not present

## 2018-04-25 DIAGNOSIS — Z881 Allergy status to other antibiotic agents status: Secondary | ICD-10-CM | POA: Diagnosis not present

## 2018-04-25 DIAGNOSIS — Z91040 Latex allergy status: Secondary | ICD-10-CM | POA: Diagnosis not present

## 2018-04-25 DIAGNOSIS — D126 Benign neoplasm of colon, unspecified: Secondary | ICD-10-CM | POA: Diagnosis not present

## 2018-04-25 DIAGNOSIS — Z7982 Long term (current) use of aspirin: Secondary | ICD-10-CM | POA: Diagnosis not present

## 2018-04-25 DIAGNOSIS — Z888 Allergy status to other drugs, medicaments and biological substances status: Secondary | ICD-10-CM | POA: Diagnosis not present

## 2018-04-25 DIAGNOSIS — I10 Essential (primary) hypertension: Secondary | ICD-10-CM | POA: Diagnosis not present

## 2018-04-25 DIAGNOSIS — Z9989 Dependence on other enabling machines and devices: Secondary | ICD-10-CM | POA: Diagnosis not present

## 2018-04-25 DIAGNOSIS — D124 Benign neoplasm of descending colon: Secondary | ICD-10-CM | POA: Diagnosis not present

## 2018-04-25 DIAGNOSIS — G629 Polyneuropathy, unspecified: Secondary | ICD-10-CM | POA: Diagnosis not present

## 2018-04-25 DIAGNOSIS — M797 Fibromyalgia: Secondary | ICD-10-CM | POA: Diagnosis not present

## 2018-05-07 DIAGNOSIS — Z7984 Long term (current) use of oral hypoglycemic drugs: Secondary | ICD-10-CM | POA: Diagnosis not present

## 2018-05-07 DIAGNOSIS — H40023 Open angle with borderline findings, high risk, bilateral: Secondary | ICD-10-CM | POA: Diagnosis not present

## 2018-05-07 DIAGNOSIS — H25013 Cortical age-related cataract, bilateral: Secondary | ICD-10-CM | POA: Diagnosis not present

## 2018-05-07 DIAGNOSIS — E119 Type 2 diabetes mellitus without complications: Secondary | ICD-10-CM | POA: Diagnosis not present

## 2018-05-07 DIAGNOSIS — H04123 Dry eye syndrome of bilateral lacrimal glands: Secondary | ICD-10-CM | POA: Diagnosis not present

## 2018-05-15 ENCOUNTER — Ambulatory Visit (HOSPITAL_COMMUNITY): Payer: Self-pay | Admitting: Psychiatry

## 2018-06-24 ENCOUNTER — Telehealth (HOSPITAL_COMMUNITY): Payer: Self-pay | Admitting: *Deleted

## 2018-06-24 ENCOUNTER — Other Ambulatory Visit (HOSPITAL_COMMUNITY): Payer: Self-pay | Admitting: Psychiatry

## 2018-06-24 MED ORDER — AMPHETAMINE-DEXTROAMPHETAMINE 20 MG PO TABS
20.0000 mg | ORAL_TABLET | Freq: Two times a day (BID) | ORAL | 0 refills | Status: DC
Start: 2018-06-24 — End: 2018-07-19

## 2018-06-24 NOTE — Telephone Encounter (Signed)
Dr Harrington Challenger  Patient called requesting refill on Adderall. Next appointment is  07/19/18

## 2018-06-24 NOTE — Telephone Encounter (Signed)
sent 

## 2018-06-26 DIAGNOSIS — E1165 Type 2 diabetes mellitus with hyperglycemia: Secondary | ICD-10-CM | POA: Diagnosis not present

## 2018-06-26 DIAGNOSIS — K754 Autoimmune hepatitis: Secondary | ICD-10-CM | POA: Diagnosis not present

## 2018-06-26 DIAGNOSIS — I10 Essential (primary) hypertension: Secondary | ICD-10-CM | POA: Diagnosis not present

## 2018-07-15 DIAGNOSIS — D225 Melanocytic nevi of trunk: Secondary | ICD-10-CM | POA: Diagnosis not present

## 2018-07-15 DIAGNOSIS — L732 Hidradenitis suppurativa: Secondary | ICD-10-CM | POA: Diagnosis not present

## 2018-07-15 DIAGNOSIS — L738 Other specified follicular disorders: Secondary | ICD-10-CM | POA: Diagnosis not present

## 2018-07-18 ENCOUNTER — Other Ambulatory Visit: Payer: Self-pay | Admitting: Cardiovascular Disease

## 2018-07-19 ENCOUNTER — Ambulatory Visit (INDEPENDENT_AMBULATORY_CARE_PROVIDER_SITE_OTHER): Payer: Medicare Other | Admitting: Psychiatry

## 2018-07-19 ENCOUNTER — Encounter (HOSPITAL_COMMUNITY): Payer: Self-pay | Admitting: Psychiatry

## 2018-07-19 DIAGNOSIS — F900 Attention-deficit hyperactivity disorder, predominantly inattentive type: Secondary | ICD-10-CM

## 2018-07-19 DIAGNOSIS — F515 Nightmare disorder: Secondary | ICD-10-CM | POA: Diagnosis not present

## 2018-07-19 DIAGNOSIS — F4310 Post-traumatic stress disorder, unspecified: Secondary | ICD-10-CM | POA: Diagnosis not present

## 2018-07-19 DIAGNOSIS — F4312 Post-traumatic stress disorder, chronic: Secondary | ICD-10-CM

## 2018-07-19 MED ORDER — AMPHETAMINE-DEXTROAMPHETAMINE 20 MG PO TABS: 20.0000 mg | ORAL_TABLET | Freq: Two times a day (BID) | ORAL | 0 refills | Status: DC

## 2018-07-19 MED ORDER — PRAZOSIN HCL 2 MG PO CAPS: 2.0000 mg | ORAL_CAPSULE | Freq: Every day | ORAL | 2 refills | Status: DC

## 2018-07-19 MED ORDER — DULOXETINE HCL 60 MG PO CPEP: ORAL_CAPSULE | ORAL | 2 refills | Status: DC

## 2018-07-19 MED ORDER — AMPHETAMINE-DEXTROAMPHETAMINE 20 MG PO TABS
20.0000 mg | ORAL_TABLET | Freq: Two times a day (BID) | ORAL | 0 refills | Status: DC
Start: 2018-07-19 — End: 2018-08-10
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
## 2018-07-30 DIAGNOSIS — G4733 Obstructive sleep apnea (adult) (pediatric): Secondary | ICD-10-CM | POA: Diagnosis not present

## 2018-08-07 DIAGNOSIS — I10 Essential (primary) hypertension: Secondary | ICD-10-CM | POA: Diagnosis not present

## 2018-08-07 DIAGNOSIS — G2581 Restless legs syndrome: Secondary | ICD-10-CM | POA: Diagnosis not present

## 2018-08-07 DIAGNOSIS — E1165 Type 2 diabetes mellitus with hyperglycemia: Secondary | ICD-10-CM | POA: Diagnosis not present

## 2018-08-07 DIAGNOSIS — G4733 Obstructive sleep apnea (adult) (pediatric): Secondary | ICD-10-CM | POA: Diagnosis not present

## 2018-08-09 ENCOUNTER — Other Ambulatory Visit: Payer: Self-pay | Admitting: Cardiovascular Disease

## 2018-08-10 ENCOUNTER — Other Ambulatory Visit: Payer: Self-pay

## 2018-08-10 ENCOUNTER — Emergency Department (HOSPITAL_COMMUNITY): Payer: Medicare Other

## 2018-08-10 ENCOUNTER — Observation Stay (HOSPITAL_COMMUNITY)
Admission: EM | Admit: 2018-08-10 | Discharge: 2018-08-12 | Disposition: A | Discharge status: Home / Self Care | Payer: Medicare Other | Attending: Pulmonary Disease | Admitting: Pulmonary Disease

## 2018-08-10 ENCOUNTER — Encounter (HOSPITAL_COMMUNITY): Payer: Self-pay

## 2018-08-10 DIAGNOSIS — J101 Influenza due to other identified influenza virus with other respiratory manifestations: Secondary | ICD-10-CM | POA: Diagnosis not present

## 2018-08-10 DIAGNOSIS — Z7982 Long term (current) use of aspirin: Secondary | ICD-10-CM | POA: Insufficient documentation

## 2018-08-10 DIAGNOSIS — Z91040 Latex allergy status: Secondary | ICD-10-CM | POA: Insufficient documentation

## 2018-08-10 DIAGNOSIS — I10 Essential (primary) hypertension: Secondary | ICD-10-CM | POA: Diagnosis not present

## 2018-08-10 DIAGNOSIS — J189 Pneumonia, unspecified organism: Secondary | ICD-10-CM

## 2018-08-10 DIAGNOSIS — R509 Fever, unspecified: Secondary | ICD-10-CM | POA: Diagnosis present

## 2018-08-10 DIAGNOSIS — M25552 Pain in left hip: Secondary | ICD-10-CM

## 2018-08-10 DIAGNOSIS — K754 Autoimmune hepatitis: Secondary | ICD-10-CM | POA: Diagnosis present

## 2018-08-10 DIAGNOSIS — R0989 Other specified symptoms and signs involving the circulatory and respiratory systems: Secondary | ICD-10-CM | POA: Diagnosis not present

## 2018-08-10 DIAGNOSIS — R6510 Systemic inflammatory response syndrome (SIRS) of non-infectious origin without acute organ dysfunction: Secondary | ICD-10-CM | POA: Diagnosis present

## 2018-08-10 DIAGNOSIS — E119 Type 2 diabetes mellitus without complications: Secondary | ICD-10-CM | POA: Insufficient documentation

## 2018-08-10 DIAGNOSIS — Z8673 Personal history of transient ischemic attack (TIA), and cerebral infarction without residual deficits: Secondary | ICD-10-CM | POA: Diagnosis not present

## 2018-08-10 DIAGNOSIS — Z87891 Personal history of nicotine dependence: Secondary | ICD-10-CM | POA: Insufficient documentation

## 2018-08-10 DIAGNOSIS — G8929 Other chronic pain: Secondary | ICD-10-CM

## 2018-08-10 LAB — URINALYSIS, COMPLETE (UACMP) WITH MICROSCOPIC
Bilirubin Urine: NEGATIVE
Glucose, UA: >=500 mg/dL — AB
Hgb urine dipstick: NEGATIVE
Ketones, ur: NEGATIVE mg/dL
Nitrite: NEGATIVE
PROTEIN: NEGATIVE mg/dL
Specific Gravity, Urine: 1.015 (ref 1.005–1.030)
pH: 6 (ref 5.0–8.0)

## 2018-08-10 LAB — COMPREHENSIVE METABOLIC PANEL
ALT: 26 U/L (ref 0–44)
AST: 29 U/L (ref 15–41)
Albumin: 3.8 g/dL (ref 3.5–5.0)
Alkaline Phosphatase: 103 U/L (ref 38–126)
Anion gap: 9 (ref 5–15)
BUN: 7 mg/dL (ref 6–20)
CO2: 26 mmol/L (ref 22–32)
Calcium: 8.8 mg/dL — ABNORMAL LOW (ref 8.9–10.3)
Chloride: 100 mmol/L (ref 98–111)
Creatinine, Ser: 0.64 mg/dL (ref 0.44–1.00)
GFR calc non Af Amer: >60 mL/min (ref >60)
Glucose, Bld: 288 mg/dL — ABNORMAL HIGH (ref 70–99)
Potassium: 3.6 mmol/L (ref 3.5–5.1)
Sodium: 135 mmol/L (ref 135–145)
Total Bilirubin: 0.5 mg/dL (ref 0.3–1.2)
Total Protein: 7.5 g/dL (ref 6.5–8.1)

## 2018-08-10 LAB — GLUCOSE, CAPILLARY
Glucose-Capillary: 313 mg/dL — ABNORMAL HIGH (ref 70–99)
Glucose-Capillary: 309 mg/dL — ABNORMAL HIGH (ref 70–99)
Glucose-Capillary: 182 mg/dL — ABNORMAL HIGH (ref 70–99)

## 2018-08-10 LAB — CBC WITH DIFFERENTIAL/PLATELET
Abs Immature Granulocytes: 0.02 10*3/uL (ref 0.00–0.07)
BASOS PCT: 0 %
Basophils Absolute: 0 10*3/uL (ref 0.0–0.1)
Eosinophils Absolute: 0.1 10*3/uL (ref 0.0–0.5)
Eosinophils Relative: 1 %
HCT: 40.6 % (ref 36.0–46.0)
Hemoglobin: 13 g/dL (ref 12.0–15.0)
Immature Granulocytes: 0 %
Lymphocytes Relative: 12 %
Lymphs Abs: 0.9 10*3/uL (ref 0.7–4.0)
MCH: 29.2 pg (ref 26.0–34.0)
MCHC: 32 g/dL (ref 30.0–36.0)
MCV: 91.2 fL (ref 80.0–100.0)
Monocytes Absolute: 0.9 10*3/uL (ref 0.1–1.0)
Monocytes Relative: 13 %
Neutro Abs: 5.2 10*3/uL (ref 1.7–7.7)
Neutrophils Relative %: 74 %
Platelets: 229 10*3/uL (ref 150–400)
RBC: 4.45 MIL/uL (ref 3.87–5.11)
RDW: 12.3 % (ref 11.5–15.5)
WBC: 7.2 10*3/uL (ref 4.0–10.5)
nRBC: 0 % (ref 0.0–0.2)

## 2018-08-10 LAB — MAGNESIUM: Magnesium: 1.9 mg/dL (ref 1.7–2.4)

## 2018-08-10 LAB — PHOSPHORUS: PHOSPHORUS: 3.4 mg/dL (ref 2.5–4.6)

## 2018-08-10 LAB — GROUP A STREP BY PCR: Group A Strep by PCR: NOT DETECTED

## 2018-08-10 LAB — INFLUENZA PANEL BY PCR (TYPE A & B)
Influenza A By PCR: POSITIVE — AB
Influenza B By PCR: NEGATIVE

## 2018-08-10 LAB — PROCALCITONIN: Procalcitonin: <0.1 ng/mL

## 2018-08-10 LAB — LACTIC ACID, PLASMA
LACTIC ACID, VENOUS: 3 mmol/L — AB (ref 0.5–1.9)
Lactic Acid, Venous: 3.6 mmol/L (ref 0.5–1.9)

## 2018-08-10 LAB — MRSA PCR SCREENING: MRSA by PCR: NEGATIVE

## 2018-08-10 MED ORDER — CALCIUM CARBONATE-VITAMIN D 500-200 MG-UNIT PO TABS
1.0000 | ORAL_TABLET | Freq: Two times a day (BID) | ORAL | Status: DC
  Administered 2018-08-10 – 2018-08-12 (×5): 1 via ORAL
  Filled 2018-08-10 (×5): qty 1

## 2018-08-10 MED ORDER — METOPROLOL SUCCINATE ER 50 MG PO TB24
50.0000 mg | ORAL_TABLET | Freq: Every day | ORAL | Status: DC
  Administered 2018-08-10 – 2018-08-12 (×3): 50 mg via ORAL
  Filled 2018-08-10 (×3): qty 1

## 2018-08-10 MED ORDER — ATORVASTATIN CALCIUM 40 MG PO TABS
80.0000 mg | ORAL_TABLET | Freq: Every day | ORAL | Status: DC
  Administered 2018-08-10 – 2018-08-12 (×3): 80 mg via ORAL
  Filled 2018-08-10 (×3): qty 2

## 2018-08-10 MED ORDER — VANCOMYCIN HCL IN DEXTROSE 1-5 GM/200ML-% IV SOLN: 1000.0000 mg | Freq: Once | INTRAVENOUS | Status: DC

## 2018-08-10 MED ORDER — ALBUTEROL SULFATE (2.5 MG/3ML) 0.083% IN NEBU
INHALATION_SOLUTION | RESPIRATORY_TRACT | Status: AC
  Administered 2018-08-10: 2.5 mg via RESPIRATORY_TRACT
  Filled 2018-08-10: qty 3

## 2018-08-10 MED ORDER — SODIUM CHLORIDE 0.9 % IV BOLUS
500.0000 mL | Freq: Once | INTRAVENOUS | Status: AC
  Administered 2018-08-10: 500 mL via INTRAVENOUS

## 2018-08-10 MED ORDER — OSELTAMIVIR PHOSPHATE 75 MG PO CAPS
75.0000 mg | ORAL_CAPSULE | Freq: Once | ORAL | Status: AC
  Administered 2018-08-10: 75 mg via ORAL
  Filled 2018-08-10: qty 1

## 2018-08-10 MED ORDER — ALBUTEROL SULFATE (2.5 MG/3ML) 0.083% IN NEBU
2.5000 mg | INHALATION_SOLUTION | Freq: Four times a day (QID) | RESPIRATORY_TRACT | Status: DC | PRN
  Administered 2018-08-10: 2.5 mg via RESPIRATORY_TRACT

## 2018-08-10 MED ORDER — DIPHENHYDRAMINE HCL 50 MG/ML IJ SOLN
12.5000 mg | Freq: Once | INTRAMUSCULAR | Status: AC
  Administered 2018-08-10: 12.5 mg via INTRAVENOUS
  Filled 2018-08-10: qty 1

## 2018-08-10 MED ORDER — ONDANSETRON HCL 4 MG PO TABS: 4.0000 mg | ORAL_TABLET | Freq: Four times a day (QID) | ORAL | Status: DC | PRN

## 2018-08-10 MED ORDER — AMPHETAMINE-DEXTROAMPHETAMINE 10 MG PO TABS
20.0000 mg | ORAL_TABLET | Freq: Two times a day (BID) | ORAL | Status: DC
  Administered 2018-08-10 – 2018-08-12 (×4): 20 mg via ORAL
  Filled 2018-08-10 (×5): qty 2

## 2018-08-10 MED ORDER — IBUPROFEN 400 MG PO TABS
400.0000 mg | ORAL_TABLET | Freq: Once | ORAL | Status: AC
  Administered 2018-08-10: 400 mg via ORAL
  Filled 2018-08-10: qty 1

## 2018-08-10 MED ORDER — ACETAMINOPHEN 325 MG PO TABS: 650.0000 mg | ORAL_TABLET | Freq: Four times a day (QID) | ORAL | Status: DC | PRN

## 2018-08-10 MED ORDER — SODIUM CHLORIDE 0.9 % IV SOLN
2.0000 g | Freq: Two times a day (BID) | INTRAVENOUS | Status: DC
  Administered 2018-08-10 – 2018-08-12 (×4): 2 g via INTRAVENOUS
  Filled 2018-08-10 (×7): qty 2

## 2018-08-10 MED ORDER — OSELTAMIVIR PHOSPHATE 75 MG PO CAPS
75.0000 mg | ORAL_CAPSULE | Freq: Two times a day (BID) | ORAL | Status: DC
  Administered 2018-08-10 – 2018-08-12 (×4): 75 mg via ORAL
  Filled 2018-08-10 (×4): qty 1

## 2018-08-10 MED ORDER — LEVOFLOXACIN IN D5W 750 MG/150ML IV SOLN
750.0000 mg | Freq: Once | INTRAVENOUS | Status: AC
  Administered 2018-08-10: 750 mg via INTRAVENOUS
  Filled 2018-08-10: qty 150

## 2018-08-10 MED ORDER — IPRATROPIUM-ALBUTEROL 0.5-2.5 (3) MG/3ML IN SOLN
3.0000 mL | Freq: Three times a day (TID) | RESPIRATORY_TRACT | Status: DC
  Administered 2018-08-11 – 2018-08-12 (×4): 3 mL via RESPIRATORY_TRACT
  Filled 2018-08-10 (×4): qty 3

## 2018-08-10 MED ORDER — KETOROLAC TROMETHAMINE 30 MG/ML IJ SOLN
30.0000 mg | Freq: Four times a day (QID) | INTRAMUSCULAR | Status: DC | PRN
  Administered 2018-08-10 – 2018-08-11 (×2): 30 mg via INTRAVENOUS
  Filled 2018-08-10 (×2): qty 1

## 2018-08-10 MED ORDER — GABAPENTIN 300 MG PO CAPS: 300.0000 mg | ORAL_CAPSULE | Freq: Every evening | ORAL | Status: DC | PRN

## 2018-08-10 MED ORDER — VITAMIN C 500 MG PO TABS
1000.0000 mg | ORAL_TABLET | Freq: Every morning | ORAL | Status: DC
  Administered 2018-08-10 – 2018-08-12 (×3): 1000 mg via ORAL
  Filled 2018-08-10 (×3): qty 2

## 2018-08-10 MED ORDER — SODIUM CHLORIDE 0.9 % IV BOLUS
1000.0000 mL | Freq: Once | INTRAVENOUS | Status: AC
  Administered 2018-08-10: 1000 mL via INTRAVENOUS

## 2018-08-10 MED ORDER — INSULIN ASPART 100 UNIT/ML ~~LOC~~ SOLN
0.0000 [IU] | Freq: Three times a day (TID) | SUBCUTANEOUS | Status: DC
  Administered 2018-08-10 (×2): 7 [IU] via SUBCUTANEOUS
  Administered 2018-08-11 (×2): 3 [IU] via SUBCUTANEOUS
  Administered 2018-08-11: 2 [IU] via SUBCUTANEOUS
  Administered 2018-08-12: 5 [IU] via SUBCUTANEOUS

## 2018-08-10 MED ORDER — DULOXETINE HCL 60 MG PO CPEP
60.0000 mg | ORAL_CAPSULE | Freq: Every day | ORAL | Status: DC
  Administered 2018-08-10 – 2018-08-12 (×3): 60 mg via ORAL
  Filled 2018-08-10 (×3): qty 1

## 2018-08-10 MED ORDER — ONDANSETRON HCL 4 MG/2ML IJ SOLN: 4.0000 mg | Freq: Four times a day (QID) | INTRAMUSCULAR | Status: DC | PRN

## 2018-08-10 MED ORDER — ENOXAPARIN SODIUM 40 MG/0.4ML ~~LOC~~ SOLN
40.0000 mg | SUBCUTANEOUS | Status: DC
  Administered 2018-08-10 – 2018-08-12 (×3): 40 mg via SUBCUTANEOUS
  Filled 2018-08-10 (×3): qty 0.4

## 2018-08-10 MED ORDER — IPRATROPIUM-ALBUTEROL 0.5-2.5 (3) MG/3ML IN SOLN
3.0000 mL | Freq: Three times a day (TID) | RESPIRATORY_TRACT | Status: DC
  Administered 2018-08-10: 3 mL via RESPIRATORY_TRACT
  Filled 2018-08-10: qty 3

## 2018-08-10 MED ORDER — PRAMIPEXOLE DIHYDROCHLORIDE 0.25 MG PO TABS
0.2500 mg | ORAL_TABLET | Freq: Every evening | ORAL | Status: DC | PRN
  Administered 2018-08-11: 0.25 mg via ORAL
  Filled 2018-08-10 (×2): qty 1

## 2018-08-10 MED ORDER — PROCHLORPERAZINE EDISYLATE 10 MG/2ML IJ SOLN
10.0000 mg | Freq: Once | INTRAMUSCULAR | Status: AC
  Administered 2018-08-10: 10 mg via INTRAVENOUS
  Filled 2018-08-10: qty 2

## 2018-08-10 MED ORDER — SODIUM CHLORIDE 0.9 % IV SOLN
INTRAVENOUS | Status: DC
  Administered 2018-08-10 – 2018-08-11 (×4): via INTRAVENOUS

## 2018-08-10 MED ORDER — ISOSORBIDE MONONITRATE ER 60 MG PO TB24
30.0000 mg | ORAL_TABLET | Freq: Every day | ORAL | Status: DC
  Administered 2018-08-10 – 2018-08-12 (×3): 30 mg via ORAL
  Filled 2018-08-10 (×3): qty 1

## 2018-08-10 MED ORDER — VANCOMYCIN HCL 10 G IV SOLR
2000.0000 mg | Freq: Once | INTRAVENOUS | Status: AC
  Administered 2018-08-10: 2000 mg via INTRAVENOUS
  Filled 2018-08-10: qty 2000

## 2018-08-10 MED ORDER — POTASSIUM CHLORIDE CRYS ER 20 MEQ PO TBCR
40.0000 meq | EXTENDED_RELEASE_TABLET | Freq: Once | ORAL | Status: AC
  Administered 2018-08-11: 40 meq via ORAL
  Filled 2018-08-10 (×2): qty 2

## 2018-08-10 MED ORDER — KETOROLAC TROMETHAMINE 30 MG/ML IJ SOLN: 30.0000 mg | Freq: Once | INTRAMUSCULAR | Status: DC

## 2018-08-10 MED ORDER — PANTOPRAZOLE SODIUM 40 MG PO TBEC
80.0000 mg | DELAYED_RELEASE_TABLET | Freq: Every day | ORAL | Status: DC
  Administered 2018-08-10 – 2018-08-11 (×2): 80 mg via ORAL
  Filled 2018-08-10 (×2): qty 2

## 2018-08-10 MED ORDER — MAGNESIUM SULFATE 2 GM/50ML IV SOLN
2.0000 g | Freq: Once | INTRAVENOUS | Status: DC
  Filled 2018-08-10 (×2): qty 50

## 2018-08-10 MED ORDER — ASPIRIN EC 325 MG PO TBEC
325.0000 mg | DELAYED_RELEASE_TABLET | Freq: Every day | ORAL | Status: DC
  Administered 2018-08-10 – 2018-08-12 (×3): 325 mg via ORAL
  Filled 2018-08-10 (×3): qty 1

## 2018-08-10 MED ORDER — ACETAMINOPHEN 650 MG RE SUPP: 650.0000 mg | Freq: Four times a day (QID) | RECTAL | Status: DC | PRN

## 2018-08-10 MED ORDER — KETOROLAC TROMETHAMINE 30 MG/ML IJ SOLN
30.0000 mg | Freq: Once | INTRAMUSCULAR | Status: AC
  Administered 2018-08-10: 30 mg via INTRAVENOUS
  Filled 2018-08-10: qty 1

## 2018-08-10 MED ORDER — VANCOMYCIN HCL 10 G IV SOLR
1250.0000 mg | INTRAVENOUS | Status: DC
  Filled 2018-08-10 (×2): qty 1250

## 2018-08-10 MED ORDER — DM-GUAIFENESIN ER 30-600 MG PO TB12
1.0000 | ORAL_TABLET | Freq: Two times a day (BID) | ORAL | Status: DC
  Administered 2018-08-10 – 2018-08-12 (×6): 1 via ORAL
  Filled 2018-08-10 (×6): qty 1

## 2018-08-10 MED ORDER — ALBUTEROL SULFATE (2.5 MG/3ML) 0.083% IN NEBU
5.0000 mg | INHALATION_SOLUTION | Freq: Once | RESPIRATORY_TRACT | Status: AC
  Administered 2018-08-10: 5 mg via RESPIRATORY_TRACT
  Filled 2018-08-10: qty 6

## 2018-08-10 NOTE — Progress Notes (Signed)
Pharmacy Antibiotic Note  Lindsay Walsh is a 48 y.o. female admitted on 08/10/2018 with sepsis.  Pharmacy has been consulted for vancomycin and cefepime dosing.  Patient has tested positive for Influenza A and is also on olseltamivir.  She received one dose of levofloxacin 750mg  IV early this morning.  Lactate is 3 mmol/L.  Plan: Start cefepime 2g IV q12h Loading dose:  vancomycin 2g IV x1 dose Maintenance dose: vancomycin 1.25g IV q24h  (predicted AUC of 482.3) Goal AUC range:  400-550     Pharmacy will continue to monitor renal function, vancomycin peak/trough as indicated for AUC dosing, cultures and patient progress.  Height: 5' (152.4 cm) Weight: 205 lb 3.2 oz (93.1 kg) IBW/kg (Calculated) : 45.5  Temp (24hrs), Avg:99.5 F (37.5 C), Min:98.9 F (37.2 C), Max:100.8 F (38.2 C)  Recent Labs  Lab 08/10/18 0500 08/10/18 0758  WBC 7.2  --   CREATININE 0.64  --   LATICACIDVEN  --  3.0*    Estimated Creatinine Clearance: 87.6 mL/min (by C-G formula based on SCr of 0.64 mg/dL).    Allergies  Allergen Reactions  . Doxycycline Shortness Of Breath and Rash  . Fentanyl Shortness Of Breath, Itching and Other (See Comments)    Heart racing, SOB  . Ceftin [Cefuroxime Axetil]   . Iohexol Other (See Comments)    Knots on body   . Ivp Dye [Iodinated Diagnostic Agents] Other (See Comments)    Knots on body  . Norvasc [Amlodipine Besylate]   . Wellbutrin [Bupropion Hcl] Other (See Comments)    unknown  . Latex Rash  . Tape Rash    Antimicrobials this admission: levofloxacin 2/1>>2/1 vancomycin 2/1 >>    cefepime 2/1 >> Anti-viral: olseltamivir 2/1 >> 2/6  Microbiology results: 2/1 BC x2:   In progress 2/1 UCx:  In progress 2/1 Group A Strep by PCR: not detected  Thank you for allowing pharmacy to participate in  this patient's care.   Despina Pole, Pharm. D. Clinical Pharmacist 08/10/2018 12:09 PM

## 2018-08-10 NOTE — ED Notes (Addendum)
Pt started z-pack yesterday prescribed by Dr Luan Pulling for exposure to flu and strep throat. Pt took ibuprofen 400mg  at at 2130 for fever and body aches. Pt says she has been staying hydrated and keeping fluids down. Reports last time she urinated was right before coming here and urine was "regular yellow in color".

## 2018-08-10 NOTE — H&P (Signed)
History and Physical  [name] STM:[id number] DOB: 1971-02-24 DOA: 08/10/2018   PCP: Sinda Du, MD   Patient coming from: Home  Chief Complaint: fever congestion  HPI:  Lindsay Walsh is a 48 y.o. female with medical history of autoimmune hepatitis diagnosed 2004, hypertension, hyperlipidemia, diabetes mellitus, fibromyalgia, PTSD, previous narcotic dependence presenting with fevers, cough, and chest congestion that started on 08/09/2018.  The patient states that she has been taking care of her granddaughter on 08/07/2018.  Apparently, her granddaughter has been subsequently been diagnosed with streptococcal pharyngitis and influenza.  The patient states that her fever was up as high as 101.9 F on the afternoon of 08/09/2018.  As result, the patient presented to the hospital for further evaluation.  She stated that she actually began feeling bad with myalgias and arthralgias on 08/08/2018.  She contacted her PCP, Dr. Luan Pulling.  A prescription was called in for azithromycin for the patient which she began taking on 08/09/2018.  She denies any hemoptysis, nausea, vomiting or diarrhea, domino pain, dysuria, hematuria, rashes.  She does have a frontal headache.  She has a sore throat with nonproductive cough.  There is no hemoptysis, hematochezia, melena.  There is no chest pain or shortness of breath. The patient was noted to have a fever 100.8 F with tachycardia she was hemodynamically stable saturating 97% on room air in the emergency department.  BMP, LFTs, and CBC were essentially unremarkable.  Influenza PCR was positive.  Group A streptococcus PCR was positive.  Chest x-ray showed bibasilar atelectasis without any consolidations.  While still in the emergency department, the patient was given a dose of levofloxacin and started on oseltamavir.  Assessment/Plan: Influenza with SIRS -Obtain blood cultures x2 sets--unfortunately will be lower yield as the patient has already received  levofloxacin and she was taking azithromycin prior to admission -UA and urine culture -Check procalcitonin -Check lactic acid -IV fluids -Start oseltamavir -Start empiric vancomycin and cefepime pending culture data  Autoimmune hepatitis -Holding Imuran temporarily in the setting of SIRS -She follows Dr. Merrilee Jansky at [facility] -Continue metoprolol succinate at lower dose -Continue Imdur -Holding prazosin, hydralazine and lisinopril to avoid hypotension  Diabetes mellitus type 2 -NovoLog sliding scale -Holding metformin  Depression/anxiety/ADHD -Continue home dose Adderall, Cymbalta, gabapentin  Hyperlipidemia -Continue statin  History of tobacco abuse -She quit smoking 34 years ago        Past Medical History:  Diagnosis Date  . Angina    took SL nitro one week ago  . Arthritis    back  . Autoimmune hepatitis ([place])   . Chest pain    Normal stress echo in 2011; PVCs; pedal edema  . CVA (cerebral infarction)   . Depression   . Diabetes mellitus without complication ([place])   . Dyspareunia 05/06/2014  . Dysrhythmia    palpatations  . Fasting hyperglycemia   . Fibromyalgia   . Gastroesophageal reflux disease    Chronic abdominal pain; gastroparesis; globus hystericus; irritable bowel syndrome  . Hypertension   . Irritable bowel syndrome   . Narcotic dependence ([place])   . Overweight(278.02)   . PTSD (post-traumatic stress disorder)   . Pulmonary nodule   . Shortness of breath    with exertion  . Sleep apnea   . Sleep apnea    on CPAP machine  . Tobacco abuse    one pack per day; 35 pack years   Past Surgical History:  Procedure Laterality Date  .  ABDOMINAL HYSTERECTOMY    . BACK SURGERY    . BLADDER SUSPENSION  09/12/2011   Procedure: TRANSVAGINAL TAPE (TVT) PROCEDURE;  Surgeon: [name], MD;  Location: [facility];  Service: Urology;  Laterality: N/A;  . CESAREAN SECTION     X3  . CHOLECYSTECTOMY    . ESOPHAGOGASTRODUODENOSCOPY  ENDOSCOPY     "throat stretched" per pt.  Marland Kitchen LIVER BIOPSY     x4  . LUMBAR EPIDURAL INJECTION  01/2012  . TOTAL ABDOMINAL HYSTERECTOMY W/ BILATERAL SALPINGOOPHORECTOMY    . TUBAL LIGATION     Bilateral  . UPPER GASTROINTESTINAL ENDOSCOPY  09/13/2010   Social History:  reports that she quit smoking about 5 years ago. Her smoking use included cigarettes. She has a 37.50 pack-year smoking history. She has never used smokeless tobacco. She reports that she does not drink alcohol or use drugs.   Family History  Problem Relation Age of Onset  . Diabetes Mother   . Hypertension Mother   . Anxiety disorder Mother   . Depression Mother   . Drug abuse Mother   . Heart disease Father   . Diabetes Father   . Anxiety disorder Father   . Alcohol abuse Father   . Depression Father   . OCD Father   . Thyroid disease Sister   . Healthy Brother   . Healthy Daughter   . Healthy Son   . ADD / ADHD Son   . Alcohol abuse Paternal Grandfather   . Depression Paternal Grandfather   . Cancer Paternal Grandfather        lung,skin  . Tuberculosis Paternal Grandfather   . Seizures Paternal Grandmother   . Lupus Paternal Grandmother   . ADD / ADHD Son   . Anesthesia problems Neg Hx   . Malignant hyperthermia Neg Hx   . Pseudochol deficiency Neg Hx   . Hypotension Neg Hx   . Bipolar disorder Neg Hx   . Dementia Neg Hx   . Paranoid behavior Neg Hx   . Schizophrenia Neg Hx   . Sexual abuse Neg Hx   . Physical abuse Neg Hx      Allergies  Allergen Reactions  . Doxycycline Shortness Of Breath and Rash  . Fentanyl Shortness Of Breath, Itching and Other (See Comments)    Heart racing, SOB  . Ceftin [Cefuroxime Axetil]   . Iohexol Other (See Comments)    Knots on body   . Ivp Dye [Iodinated Diagnostic Agents] Other (See Comments)    Knots on body  . Norvasc [Amlodipine Besylate]   . Wellbutrin [Bupropion Hcl] Other (See Comments)    unknown  . Latex Rash  . Tape Rash     Prior to  Admission medications   Medication Sig Start Date End Date Taking? Authorizing Provider  ammonium lactate (AMLACTIN) 12 % cream  12/14/16   [provider]  amphetamine-dextroamphetamine (ADDERALL) 20 MG tablet Take 1 tablet (20 mg total) by mouth 2 (two) times daily. 07/19/18 07/19/19  Cloria Spring, MD  amphetamine-dextroamphetamine (ADDERALL) 20 MG tablet Take 1 tablet (20 mg total) by mouth 2 (two) times daily. 07/19/18 07/19/19  Cloria Spring, MD  amphetamine-dextroamphetamine (ADDERALL) 20 MG tablet Take 1 tablet (20 mg total) by mouth 2 (two) times daily. 07/19/18   Cloria Spring, MD  Ascorbic Acid (VITAMIN C) 1000 MG tablet Take 1,000 mg by mouth every morning.     [provider]  aspirin 325 MG EC tablet Take 325  mg by mouth daily.    [provider]  atorvastatin (LIPITOR) 80 MG tablet Take 1 tablet by mouth daily. 12/17/16   [provider]  azaTHIOprine (IMURAN) 50 MG tablet TAKE 1 TABLET BY MOUTH ONCE DAILY 11/16/17   Setzer, Rona Ravens, NP  calcium-vitamin D (OS-CAL 500 + D) 500-200 MG-UNIT per tablet Take 1 tablet by mouth 2 (two) times daily.     [provider]  CIPRODEX otic suspension Place 2 drops into the left ear as needed. Reported on 10/04/2015 02/19/15   [provider]  dicyclomine (BENTYL) 10 MG capsule Take 1 capsule (10 mg total) by mouth daily as needed. For cramps 03/08/15   Sinda Du, MD  DULoxetine (CYMBALTA) 60 MG capsule TAKE 1 CAPSULE(60 MG) BY MOUTH TWICE DAILY 07/19/18   Cloria Spring, MD  esomeprazole (NEXIUM) 40 MG capsule TAKE 1 CAPSULE BY MOUTH EVERY MORNING 12/17/17   [name], Mechele Dawley, MD  fexofenadine (ALLEGRA) 60 MG tablet Take 1 tablet by mouth 2 (two) times daily as needed. 06/27/10   [provider]  furosemide (LASIX) 40 MG tablet Take 40 mg by mouth every other day.     [provider]  gabapentin (NEURONTIN) 300 MG capsule Take 300 mg by mouth at bedtime as needed (for pain).      [provider]  GARLIC PO Take 9,030 mg by mouth daily.     [provider]  hydrALAZINE (APRESOLINE) 50 MG tablet Take 1 tablet (50 mg total) by mouth every 8 (eight) hours. 03/08/15   Sinda Du, MD  isosorbide mononitrate (IMDUR) 30 MG 24 hr tablet TAKE ONE (1) TABLET EACH DAY 03/09/14   Lendon Colonel, NP  lisinopril (PRINIVIL,ZESTRIL) 40 MG tablet Take 40 mg by mouth daily.  09/23/12   [provider]  loperamide (IMODIUM) 2 MG capsule Take 1 capsule (2 mg total) by mouth 4 (four) times daily as needed for diarrhea or loose stools. 03/07/17   Long, Wonda Olds, MD  metFORMIN (GLUCOPHAGE) 500 MG tablet Take 500 mg by mouth 2 (two) times daily with a meal.  05/23/17   [provider]  metoprolol succinate (TOPROL-XL) 100 MG 24 hr tablet TAKE 1 TABLET BY MOUTH ONCE DAILY WITH FOOD 08/09/18   Josue Hector, MD  Multiple Vitamins-Minerals (CENTRUM) tablet Take 1 tablet by mouth daily.    [provider]  ondansetron (ZOFRAN) 4 MG tablet Take 1 tablet (4 mg total) by mouth every 6 (six) hours. 10/29/17   Hayden Rasmussen, MD  pramipexole (MIRAPEX) 0.25 MG tablet Take 0.25 mg by mouth at bedtime as needed. For restless legs    [provider]  prazosin (MINIPRESS) 2 MG capsule Take 1 capsule (2 mg total) by mouth at bedtime. 07/19/18   Cloria Spring, MD  predniSONE (DELTASONE) 10 MG tablet Take 5 tablets (50 mg total) by mouth See admin instructions. Prednisone 50mg  at 13 hours, 7 hours, and 1 hour prior to study Patient not taking: Reported on 07/19/2018 11/01/17   Setzer, Rona Ravens, NP  PROAIR HFA 108 (90 BASE) MCG/ACT inhaler Inhale 1-2 puffs into the lungs every 6 (six) hours as needed for wheezing or shortness of breath.  12/16/12   [provider]  Probiotic Product (RESTORA PO) Take by mouth daily.     [provider]    Review of Systems:  Constitutional:  No weight loss, night sweats Head&Eyes:   No vision loss.  No  eye  pain or scotoma ENT:  No Difficulty swallowing,Tooth/dental problems,Sore throat,  No ear ache, post nasal drip,  Cardio-vascular:  No chest pain, Orthopnea, PND, swelling in lower extremities,  dizziness, palpitations  GI:  No  abdominal pain, nausea, vomiting, diarrhea, loss of appetite, hematochezia, melena, heartburn, indigestion, Resp:  No shortness of breath with exertion or at rest.  No coughing up of blood .No wheezing.No chest wall deformity  Skin:  no rash or lesions.  GU:  no dysuria, change in color of urine, no urgency or frequency. No flank pain.  Musculoskeletal:  No joint pain or swelling. No decreased range of motion. No back pain.  Psych:  No change in mood or affect. No depression or anxiety. Neurologic: No headache, no dysesthesia, no focal weakness, no vision loss. No syncope  Physical Exam: Vitals:   08/10/18 0200 08/10/18 0230 08/10/18 0423 08/10/18 0505  BP: (!) 139/96 (!) 141/76  127/88  Pulse: (!) 111 (!) 113  (!) 117  Resp: 18 17  20   Temp:    99 F (37.2 C)  TempSrc:    Oral  SpO2: 98% 95% 98% 97%  Weight:      Height:       General:  A&O x 3, NAD, nontoxic, pleasant/cooperative Head/Eye: No conjunctival hemorrhage, no icterus, [place]/AT, No nystagmus ENT:  No icterus,  No thrush, good dentition, no pharyngeal exudate Neck:  No masses, no lymphadenpathy, no bruits CV:  RRR, no rub, no gallop, no S3 Lung:  CTAB, good air movement, no wheeze, no rhonchi Abdomen: soft/NT, +BS, nondistended, no peritoneal signs Ext: No cyanosis, No rashes, No petechiae, No lymphangitis, No edema Neuro: CNII-XII intact, strength 4/5 in bilateral upper and lower extremities, no dysmetria  Labs on Admission:  Basic Metabolic Panel: Recent Labs  Lab 08/10/18 0500  NA 135  K 3.6  CL 100  CO2 26  GLUCOSE 288*  BUN 7  CREATININE 0.64  CALCIUM 8.8*   Liver Function Tests: Recent Labs  Lab 08/10/18 0500  AST 29  ALT 26  ALKPHOS 103  BILITOT 0.5  PROT  7.5  ALBUMIN 3.8   No results for input(s): LIPASE, AMYLASE in the last 168 hours. No results for input(s): AMMONIA in the last 168 hours. CBC: Recent Labs  Lab 08/10/18 0500  WBC 7.2  NEUTROABS 5.2  HGB 13.0  HCT 40.6  MCV 91.2  PLT 229   Coagulation Profile: No results for input(s): INR, PROTIME in the last 168 hours. Cardiac Enzymes: No results for input(s): CKTOTAL, CKMB, CKMBINDEX, TROPONINI in the last 168 hours. BNP: Invalid input(s): POCBNP CBG: No results for input(s): GLUCAP in the last 168 hours. Urine analysis:    Component Value Date/Time   COLORURINE YELLOW 03/07/2017 1152   APPEARANCEUR HAZY (A) 03/07/2017 1152   LABSPEC 1.021 03/07/2017 1152   PHURINE 6.0 03/07/2017 1152   GLUCOSEU NEGATIVE 03/07/2017 1152   HGBUR NEGATIVE 03/07/2017 1152   BILIRUBINUR NEGATIVE 03/07/2017 1152   KETONESUR NEGATIVE 03/07/2017 1152   PROTEINUR NEGATIVE 03/07/2017 1152   UROBILINOGEN 0.2 10/23/2012 1625   NITRITE NEGATIVE 03/07/2017 1152   LEUKOCYTESUR MODERATE (A) 03/07/2017 1152   Sepsis Labs: @LABRCNTIP (procalcitonin:4,lacticidven:4) ) Recent Results (from the past 240 hour(s))  Group A Strep by PCR     Status: None   Collection Time: 08/10/18  3:15 AM  Result Value Ref Range Status   Group A Strep by PCR NOT DETECTED NOT DETECTED Final    Comment: Performed at [facility], [address] [place], [place] [address]     Radiological Exams on Admission: Dg Chest 2 View  Result Date: 08/10/2018 CLINICAL DATA:  48 y/o  F; sore throat, cough, congestion. EXAM: CHEST - 2 VIEW COMPARISON:  11/20/2014 chest radiograph FINDINGS: Stable normal cardiac silhouette given projection and technique. Streaky opacities at the lung bases. No focal consolidation. No pleural effusion or pneumothorax. No acute osseous abnormality is evident. IMPRESSION: Streaky opacities at the lung bases probably representing bronchitic changes. No focal consolidation. Electronically Signed   By:  Kristine Garbe M.D.   On: 08/10/2018 04:51        Time spent:60 minutes Code Status:   FULL Family Communication:  No Family at bedside Disposition Plan: expect 1-2 day hospitalization Consults called: none DVT Prophylaxis: [place] Lovenox  Orson Eva, DO  [facility] Pager [phone]  If 7PM-7AM, please contact night-coverage [email or web address] Password [facility] 08/10/2018, 7:46 AM

## 2018-08-10 NOTE — ED Provider Notes (Signed)
[facility] Provider Note   CSN: [id number] Arrival date & time: 08/10/18  0033  Time seen 3:42 AM   History   Chief Complaint Chief Complaint  Patient presents with  . Fever    HPI Lindsay Walsh is a 48 y.o. female.  HPI patient states she started feeling bad on January 30.  She states she she is having a dry cough although she feels like she has mucus in her chest.  She had a fever yesterday, January 31 and tonight her highest fever was 101.9.  This was just prior to coming to the ED.  She states her throat is sore and feels scratchy when she coughs.  She denies rhinorrhea, sneezing, nausea, vomiting, or diarrhea.  She states she feels tired and weak and has lots of body aches.  She did take the flu shot this season.  She states her granddaughter stayed with her on the evening of January 29 and later developed a fever and was diagnosed with strep and flu.  Patient states she feels like she is wheezing and she has used a nebulizer in the past.  Patient states she has a autoimmune hepatitis and her doctor at [facility] does not want her to get a fever.  PCP Sinda Du, MD   Past Medical History:  Diagnosis Date  . Angina    took SL nitro one week ago  . Arthritis    back  . Autoimmune hepatitis ([place])   . Chest pain    Normal stress echo in 2011; PVCs; pedal edema  . CVA (cerebral infarction)   . Depression   . Diabetes mellitus without complication ([place])   . Dyspareunia 05/06/2014  . Dysrhythmia    palpatations  . Fasting hyperglycemia   . Fibromyalgia   . Gastroesophageal reflux disease    Chronic abdominal pain; gastroparesis; globus hystericus; irritable bowel syndrome  . Hypertension   . Irritable bowel syndrome   . Narcotic dependence ([place])   . Overweight(278.02)   . PTSD (post-traumatic stress disorder)   . Pulmonary nodule   . Shortness of breath    with exertion  . Sleep apnea   . Sleep apnea    on CPAP machine  . Tobacco abuse     one pack per day; 35 pack years    Patient Active Problem List   Diagnosis Date Noted  . Influenza A 08/10/2018  . Essential hypertension, malignant 03/07/2015  . CVA (cerebral infarction) 03/05/2015  . Dyspareunia 05/06/2014  . Obesity 09/30/2013  . Bilateral hip pain 07/16/2013  . Chronic pain syndrome 07/16/2013  . Degenerative disc disease, lumbar 07/16/2013  . Greater trochanteric bursitis of both hips 07/16/2013  . Nightmares associated with chronic post-traumatic stress disorder 10/18/2012  . Sprain of foot 03/14/2012  . Weakness 01/29/2012  . Hepatitis, autoimmune ([place]) 01/29/2012  . IBS (irritable bowel syndrome) 08/01/2011  . Gastroesophageal reflux disease   . Hypertension   . Depression   . Chest pain   . Fasting hyperglycemia   . TOBACCO ABUSE 04/28/2010  . NARCOTIC ABUSE 04/28/2010  . PULMONARY NODULE 04/28/2010  . AUTOIMMUNE HEPATITIS 04/28/2010  . Myalgia and myositis 01/06/2009    Past Surgical History:  Procedure Laterality Date  . ABDOMINAL HYSTERECTOMY    . BACK SURGERY    . BLADDER SUSPENSION  09/12/2011   Procedure: TRANSVAGINAL TAPE (TVT) PROCEDURE;  Surgeon: [name], MD;  Location: [facility];  Service: Urology;  Laterality: N/A;  . CESAREAN SECTION  X3  . CHOLECYSTECTOMY    . ESOPHAGOGASTRODUODENOSCOPY ENDOSCOPY     "throat stretched" per pt.  Marland Kitchen LIVER BIOPSY     x4  . LUMBAR EPIDURAL INJECTION  01/2012  . TOTAL ABDOMINAL HYSTERECTOMY W/ BILATERAL SALPINGOOPHORECTOMY    . TUBAL LIGATION     Bilateral  . UPPER GASTROINTESTINAL ENDOSCOPY  09/13/2010     OB History    Gravida  3   Para  3   Term      Preterm      AB      Living  3     SAB      TAB      Ectopic      Multiple      Live Births               Home Medications    Prior to Admission medications   Medication Sig Start Date End Date Taking? Authorizing Provider  ammonium lactate (AMLACTIN) 12 % cream  12/14/16   [provider]   amphetamine-dextroamphetamine (ADDERALL) 20 MG tablet Take 1 tablet (20 mg total) by mouth 2 (two) times daily. 07/19/18 07/19/19  Cloria Spring, MD  amphetamine-dextroamphetamine (ADDERALL) 20 MG tablet Take 1 tablet (20 mg total) by mouth 2 (two) times daily. 07/19/18 07/19/19  Cloria Spring, MD  amphetamine-dextroamphetamine (ADDERALL) 20 MG tablet Take 1 tablet (20 mg total) by mouth 2 (two) times daily. 07/19/18   Cloria Spring, MD  Ascorbic Acid (VITAMIN C) 1000 MG tablet Take 1,000 mg by mouth every morning.     [provider]  aspirin 325 MG EC tablet Take 325 mg by mouth daily.    [provider]  atorvastatin (LIPITOR) 80 MG tablet Take 1 tablet by mouth daily. 12/17/16   [provider]  azaTHIOprine (IMURAN) 50 MG tablet TAKE 1 TABLET BY MOUTH ONCE DAILY 11/16/17   Setzer, Rona Ravens, NP  calcium-vitamin D (OS-CAL 500 + D) 500-200 MG-UNIT per tablet Take 1 tablet by mouth 2 (two) times daily.     [provider]  CIPRODEX otic suspension Place 2 drops into the left ear as needed. Reported on 10/04/2015 02/19/15   [provider]  dicyclomine (BENTYL) 10 MG capsule Take 1 capsule (10 mg total) by mouth daily as needed. For cramps 03/08/15   Sinda Du, MD  DULoxetine (CYMBALTA) 60 MG capsule TAKE 1 CAPSULE(60 MG) BY MOUTH TWICE DAILY 07/19/18   Cloria Spring, MD  esomeprazole (NEXIUM) 40 MG capsule TAKE 1 CAPSULE BY MOUTH EVERY MORNING 12/17/17   [name], Mechele Dawley, MD  fexofenadine (ALLEGRA) 60 MG tablet Take 1 tablet by mouth 2 (two) times daily as needed. 06/27/10   [provider]  furosemide (LASIX) 40 MG tablet Take 40 mg by mouth every other day.     [provider]  gabapentin (NEURONTIN) 300 MG capsule Take 300 mg by mouth at bedtime as needed (for pain).     [provider]  GARLIC PO Take 3,016 mg by mouth daily.     [provider]  hydrALAZINE (APRESOLINE) 50 MG tablet Take 1 tablet (50 mg total) by  mouth every 8 (eight) hours. 03/08/15   Sinda Du, MD  isosorbide mononitrate (IMDUR) 30 MG 24 hr tablet TAKE ONE (1) TABLET EACH DAY 03/09/14   Lendon Colonel, NP  lisinopril (PRINIVIL,ZESTRIL) 40 MG tablet Take 40 mg by mouth daily.  09/23/12   [provider]  loperamide (IMODIUM)  2 MG capsule Take 1 capsule (2 mg total) by mouth 4 (four) times daily as needed for diarrhea or loose stools. 03/07/17   Long, Wonda Olds, MD  metFORMIN (GLUCOPHAGE) 500 MG tablet Take 500 mg by mouth 2 (two) times daily with a meal.  05/23/17   [provider]  metoprolol succinate (TOPROL-XL) 100 MG 24 hr tablet TAKE 1 TABLET BY MOUTH ONCE DAILY WITH FOOD 08/09/18   Josue Hector, MD  Multiple Vitamins-Minerals (CENTRUM) tablet Take 1 tablet by mouth daily.    [provider]  ondansetron (ZOFRAN) 4 MG tablet Take 1 tablet (4 mg total) by mouth every 6 (six) hours. 10/29/17   Hayden Rasmussen, MD  pramipexole (MIRAPEX) 0.25 MG tablet Take 0.25 mg by mouth at bedtime as needed. For restless legs    [provider]  prazosin (MINIPRESS) 2 MG capsule Take 1 capsule (2 mg total) by mouth at bedtime. 07/19/18   Cloria Spring, MD  predniSONE (DELTASONE) 10 MG tablet Take 5 tablets (50 mg total) by mouth See admin instructions. Prednisone 50mg  at 13 hours, 7 hours, and 1 hour prior to study Patient not taking: Reported on 07/19/2018 11/01/17   Setzer, Rona Ravens, NP  PROAIR HFA 108 (90 BASE) MCG/ACT inhaler Inhale 1-2 puffs into the lungs every 6 (six) hours as needed for wheezing or shortness of breath.  12/16/12   [provider]  Probiotic Product (RESTORA PO) Take by mouth daily.     [provider]    Family History Family History  Problem Relation Age of Onset  . Diabetes Mother   . Hypertension Mother   . Anxiety disorder Mother   . Depression Mother   . Drug abuse Mother   . Heart disease Father   . Diabetes Father   . Anxiety disorder Father   .  Alcohol abuse Father   . Depression Father   . OCD Father   . Thyroid disease Sister   . Healthy Brother   . Healthy Daughter   . Healthy Son   . ADD / ADHD Son   . Alcohol abuse Paternal Grandfather   . Depression Paternal Grandfather   . Cancer Paternal Grandfather        lung,skin  . Tuberculosis Paternal Grandfather   . Seizures Paternal Grandmother   . Lupus Paternal Grandmother   . ADD / ADHD Son   . Anesthesia problems Neg Hx   . Malignant hyperthermia Neg Hx   . Pseudochol deficiency Neg Hx   . Hypotension Neg Hx   . Bipolar disorder Neg Hx   . Dementia Neg Hx   . Paranoid behavior Neg Hx   . Schizophrenia Neg Hx   . Sexual abuse Neg Hx   . Physical abuse Neg Hx     Social History Social History   Tobacco Use  . Smoking status: Former Smoker    Packs/day: 1.50    Years: 25.00    Pack years: 37.50    Types: Cigarettes    Last attempt to quit: 04/25/2013    Years since quitting: 5.2  . Smokeless tobacco: Never Used  . Tobacco comment: 15-20 cigs a day as of 11/12/2012  Substance Use Topics  . Alcohol use: No    Alcohol/week: 0.0 standard drinks  . Drug use: No     Allergies   Doxycycline; Fentanyl; Ceftin [cefuroxime axetil]; Iohexol; Ivp dye [iodinated diagnostic agents]; Norvasc [amlodipine besylate]; Wellbutrin [bupropion hcl]; Latex; and Tape  Review of Systems Review of Systems  All other systems reviewed and are negative.    Physical Exam Updated Vital Signs BP (!) 141/76   Pulse (!) 113   Temp 98.9 F (37.2 C) (Oral)   Resp 17   Ht 5' (1.524 m)   Wt 92.1 kg   SpO2 98%   BMI 39.65 kg/m   Vital signs normal except for tachycardia   Physical Exam Vitals signs and nursing note reviewed.  Constitutional:      General: She is not in acute distress.    Appearance: Normal appearance. She is well-developed. She is not ill-appearing or toxic-appearing.  HENT:     Head: Normocephalic and atraumatic.     Right Ear: External ear normal.      Left Ear: External ear normal.     Nose: Nose normal. No mucosal edema or rhinorrhea.     Mouth/Throat:     Mouth: Mucous membranes are dry.     Dentition: No dental abscesses.     Pharynx: No uvula swelling.  Eyes:     Conjunctiva/sclera: Conjunctivae normal.     Pupils: Pupils are equal, round, and reactive to light.  Neck:     Musculoskeletal: Full passive range of motion without pain, normal range of motion and neck supple.  Cardiovascular:     Rate and Rhythm: Normal rate and regular rhythm.     Heart sounds: Normal heart sounds. No murmur. No friction rub. No gallop.   Pulmonary:     Effort: Pulmonary effort is normal. No respiratory distress.     Breath sounds: No wheezing, rhonchi or rales.     Comments: Patient cannot breathe deep without coughing, I am really unable to assess her breath sounds Chest:     Chest wall: No tenderness or crepitus.  Abdominal:     General: Bowel sounds are normal. There is no distension.     Palpations: Abdomen is soft.     Tenderness: There is no abdominal tenderness. There is no guarding or rebound.  Musculoskeletal: Normal range of motion.        General: No tenderness.     Comments: Moves all extremities well.   Skin:    General: Skin is warm and dry.     Coloration: Skin is not pale.     Findings: No erythema or rash.  Neurological:     Mental Status: She is alert and oriented to person, place, and time.     Cranial Nerves: No cranial nerve deficit.  Psychiatric:        Mood and Affect: Mood is not anxious.        Speech: Speech normal.        Behavior: Behavior normal.      ED Treatments / Results  Labs (all labs ordered are listed, but only abnormal results are displayed)  Results for orders placed or performed during the hospital encounter of 08/10/18  Group A Strep by PCR  Result Value Ref Range   Group A Strep by PCR NOT DETECTED NOT DETECTED  Influenza panel by PCR (type A & B)  Result Value Ref Range    Influenza A By PCR POSITIVE (A) NEGATIVE   Influenza B By PCR NEGATIVE NEGATIVE  CBC with Differential  Result Value Ref Range   WBC 7.2 4.0 - 10.5 K/uL   RBC 4.45 3.87 - 5.11 MIL/uL   Hemoglobin 13.0 12.0 - 15.0 g/dL   HCT 40.6 36.0 - 46.0 %  MCV 91.2 80.0 - 100.0 fL   MCH 29.2 26.0 - 34.0 pg   MCHC 32.0 30.0 - 36.0 g/dL   RDW 12.3 11.5 - 15.5 %   Platelets 229 150 - 400 K/uL   nRBC 0.0 0.0 - 0.2 %   Neutrophils Relative % 74 %   Neutro Abs 5.2 1.7 - 7.7 K/uL   Lymphocytes Relative 12 %   Lymphs Abs 0.9 0.7 - 4.0 K/uL   Monocytes Relative 13 %   Monocytes Absolute 0.9 0.1 - 1.0 K/uL   Eosinophils Relative 1 %   Eosinophils Absolute 0.1 0.0 - 0.5 K/uL   Basophils Relative 0 %   Basophils Absolute 0.0 0.0 - 0.1 K/uL   Immature Granulocytes 0 %   Abs Immature Granulocytes 0.02 0.00 - 0.07 K/uL  Comprehensive metabolic panel  Result Value Ref Range   Sodium 135 135 - 145 mmol/L   Potassium 3.6 3.5 - 5.1 mmol/L   Chloride 100 98 - 111 mmol/L   CO2 26 22 - 32 mmol/L   Glucose, Bld 288 (H) 70 - 99 mg/dL   BUN 7 6 - 20 mg/dL   Creatinine, Ser 0.64 0.44 - 1.00 mg/dL   Calcium 8.8 (L) 8.9 - 10.3 mg/dL   Total Protein 7.5 6.5 - 8.1 g/dL   Albumin 3.8 3.5 - 5.0 g/dL   AST 29 15 - 41 U/L   ALT 26 0 - 44 U/L   Alkaline Phosphatase 103 38 - 126 U/L   Total Bilirubin 0.5 0.3 - 1.2 mg/dL   GFR calc non Af Amer >60 >60 mL/min   GFR calc Af Amer >60 >60 mL/min   Anion gap 9 5 - 15   Laboratory interpretation all normal except + Influenza A, hyperglycemia   EKG None  Radiology Dg Chest 2 View  Result Date: 08/10/2018 CLINICAL DATA:  48 y/o  F; sore throat, cough, congestion. EXAM: CHEST - 2 VIEW COMPARISON:  11/20/2014 chest radiograph FINDINGS: Stable normal cardiac silhouette given projection and technique. Streaky opacities at the lung bases. No focal consolidation. No pleural effusion or pneumothorax. No acute osseous abnormality is evident. IMPRESSION: Streaky opacities at  the lung bases probably representing bronchitic changes. No focal consolidation. Electronically Signed   By: Kristine Garbe M.D.   On: 08/10/2018 04:51    Procedures Procedures (including critical care time)  Medications Ordered in ED Medications  dextromethorphan-guaiFENesin (MUCINEX DM) 30-600 MG per 12 hr tablet 1 tablet (1 tablet Oral Given 08/10/18 0455)  levofloxacin (LEVAQUIN) IVPB 750 mg (750 mg Intravenous New Bag/Given 08/10/18 0533)  albuterol (PROVENTIL) (2.5 MG/3ML) 0.083% nebulizer solution 5 mg (5 mg Nebulization Given 08/10/18 0421)  ibuprofen (ADVIL,MOTRIN) tablet 400 mg (400 mg Oral Given 08/10/18 0456)  oseltamivir (TAMIFLU) capsule 75 mg (75 mg Oral Given 08/10/18 0455)  sodium chloride 0.9 % bolus 1,000 mL (1,000 mLs Intravenous New Bag/Given 08/10/18 0503)  sodium chloride 0.9 % bolus 500 mL (500 mLs Intravenous New Bag/Given 08/10/18 0502)     Initial Impression / Assessment and Plan / ED Course  I have reviewed the triage vital signs and the nursing notes.  Pertinent labs & imaging results that were available during my care of the patient were reviewed by me and considered in my medical decision making (see chart for details).     Patient was given a albuterol nebulizer treatment, she was tested for the flu for strep.  She requested ibuprofen for headache.  I have reviewed her test results,  patient was started on oral Tamiflu.  She was given Mucinex DM for her cough.  She was noted to have some tachycardia and was given IV fluids.  6:10 AM patient's tests have all resulted.  This was discussed with the patient.  She looks improved since she is gotten her IV fluids, nebulizer treatment and Mucinex DM.  Patient is immune compromised however and she is on Imuran.  She is already been on Zithromax for couple days and her chest x-ray is being read as bilateral opacities without pulmonary infiltrates.  She is agreeable to being admitted.  6:15 AM Dr. Olevia Bowens, hospitalist  will admit  Final Clinical Impressions(s) / ED Diagnoses   Final diagnoses:  Influenza A  Community acquired pneumonia, unspecified laterality    Plan admission  Rolland Porter, MD, Barbette Or, MD 08/10/18 [phone]

## 2018-08-10 NOTE — Progress Notes (Signed)
Patient called RN to room and c/o being hot.  RN checked patient's temp and temp at this time 101.4.  RN applied cool compress to patient's neck and patient has already been given Toradol 30 mg IV at [place].  Patient states she is unable to take Tylenol due to hepatitis.  RN paged C. Bodenheimer, NP regarding fever and NP returned call and advised RN to continue to monitor patient and make him aware if patient continues to be febrile.  NP states he is unable to give any Ibuprofen at this time due to patient just receiving Toradol, but if needed he could give her Ibuprofen around midnight.  No further orders received at this time.  RN will continue to monitor patient and make NP aware of any changes.

## 2018-08-10 NOTE — Progress Notes (Signed)
CRITICAL VALUE ALERT  Critical Value:  Lactic acid  Date & Time Notied:  08/10/18 0800  Provider Notified:yes  Orders Received/Actions taken: awaiting further orders

## 2018-08-10 NOTE — Progress Notes (Signed)
Patient has refused to wear her CPAP a this time. She has been in poor health all evening and running a low grade temp. She is resting comfortably at his time and CPA is ather bedside ready to go. Rt to will continue to monitor patient through the evening.

## 2018-08-10 NOTE — ED Triage Notes (Signed)
Pt states she has autoimmune hepatitis and was told she should not let her fever get high.  Pt states she took ibuprofen at 2130.  Pt also c/o sore throat, cough, congestion.

## 2018-08-11 ENCOUNTER — Observation Stay (HOSPITAL_COMMUNITY): Payer: Medicare Other

## 2018-08-11 ENCOUNTER — Encounter (HOSPITAL_COMMUNITY): Payer: Self-pay | Admitting: Radiology

## 2018-08-11 DIAGNOSIS — I10 Essential (primary) hypertension: Secondary | ICD-10-CM | POA: Diagnosis not present

## 2018-08-11 DIAGNOSIS — E1165 Type 2 diabetes mellitus with hyperglycemia: Secondary | ICD-10-CM | POA: Diagnosis not present

## 2018-08-11 DIAGNOSIS — J101 Influenza due to other identified influenza virus with other respiratory manifestations: Secondary | ICD-10-CM | POA: Diagnosis not present

## 2018-08-11 DIAGNOSIS — M25552 Pain in left hip: Secondary | ICD-10-CM | POA: Diagnosis not present

## 2018-08-11 DIAGNOSIS — S79912A Unspecified injury of left hip, initial encounter: Secondary | ICD-10-CM | POA: Diagnosis not present

## 2018-08-11 LAB — CBC
HCT: 32.9 % — ABNORMAL LOW (ref 36.0–46.0)
Hemoglobin: 10.5 g/dL — ABNORMAL LOW (ref 12.0–15.0)
MCH: 29.5 pg (ref 26.0–34.0)
MCHC: 31.9 g/dL (ref 30.0–36.0)
MCV: 92.4 fL (ref 80.0–100.0)
Platelets: 165 10*3/uL (ref 150–400)
RBC: 3.56 MIL/uL — ABNORMAL LOW (ref 3.87–5.11)
RDW: 12.2 % (ref 11.5–15.5)
WBC: 4.8 10*3/uL (ref 4.0–10.5)
nRBC: 0 % (ref 0.0–0.2)

## 2018-08-11 LAB — GLUCOSE, CAPILLARY
Glucose-Capillary: 168 mg/dL — ABNORMAL HIGH (ref 70–99)
Glucose-Capillary: 220 mg/dL — ABNORMAL HIGH (ref 70–99)
Glucose-Capillary: 213 mg/dL — ABNORMAL HIGH (ref 70–99)
Glucose-Capillary: 204 mg/dL — ABNORMAL HIGH (ref 70–99)

## 2018-08-11 LAB — BASIC METABOLIC PANEL
Anion gap: 6 (ref 5–15)
BUN: 7 mg/dL (ref 6–20)
CO2: 22 mmol/L (ref 22–32)
CREATININE: 0.57 mg/dL (ref 0.44–1.00)
Calcium: 8 mg/dL — ABNORMAL LOW (ref 8.9–10.3)
Chloride: 108 mmol/L (ref 98–111)
GFR calc Af Amer: >60 mL/min (ref >60)
GFR calc non Af Amer: >60 mL/min (ref >60)
GLUCOSE: 183 mg/dL — AB (ref 70–99)
Potassium: 3.9 mmol/L (ref 3.5–5.1)
Sodium: 136 mmol/L (ref 135–145)

## 2018-08-11 LAB — HEMOGLOBIN A1C
Hgb A1c MFr Bld: 9.9 % — ABNORMAL HIGH (ref 4.8–5.6)
Mean Plasma Glucose: 237.43 mg/dL

## 2018-08-11 LAB — HIV ANTIBODY (ROUTINE TESTING W REFLEX): HIV Screen 4th Generation wRfx: NONREACTIVE

## 2018-08-11 LAB — PROCALCITONIN: Procalcitonin: <0.1 ng/mL

## 2018-08-11 MED ORDER — HYDRALAZINE HCL 25 MG PO TABS
50.0000 mg | ORAL_TABLET | Freq: Three times a day (TID) | ORAL | Status: DC
  Administered 2018-08-11 – 2018-08-12 (×2): 50 mg via ORAL
  Filled 2018-08-11 (×2): qty 2

## 2018-08-11 MED ORDER — PRAZOSIN HCL 1 MG PO CAPS
2.0000 mg | ORAL_CAPSULE | Freq: Every day | ORAL | Status: DC
  Administered 2018-08-11: 2 mg via ORAL
  Filled 2018-08-11: qty 2
  Filled 2018-08-11: qty 1
  Filled 2018-08-11: qty 2

## 2018-08-11 NOTE — Progress Notes (Signed)
Subjective: She was admitted yesterday with influenza.  She still feels bad.  She is still coughing.  She complains of left hip pain.  She still had fever through the night.  She has CPAP but did not wear it yesterday.  Objective: Vital signs in last 24 hours: Temp:  [98.4 F (36.9 C)-101.4 F (38.6 C)] 99.5 F (37.5 C) (02/02 0528) Pulse Rate:  [86-90] 87 (02/02 0528) Resp:  [16-20] 20 (02/02 0528) BP: (119-149)/(74-100) 149/100 (02/02 0528) SpO2:  [93 %-98 %] 96 % (02/02 0802) Weight change: 0.998 kg Last BM Date: 08/09/18  Intake/Output from previous day: 02/01 0701 - 02/02 0700 In: 2712.2 [P.O.:840; I.V.:573.1; IV Piggyback:1299.1] Out: 1350 [Urine:1350]  PHYSICAL EXAM General appearance: alert, cooperative and mild distress Resp: rhonchi bilaterally Cardio: regular rate and rhythm, S1, S2 normal, no murmur, click, rub or gallop GI: soft, non-tender; bowel sounds normal; no masses,  no organomegaly Extremities: extremities normal, atraumatic, no cyanosis or edema  Lab Results:  Results for orders placed or performed during the hospital encounter of 08/10/18 (from the past 48 hour(s))  Group A Strep by PCR     Status: None   Collection Time: 08/10/18  3:15 AM  Result Value Ref Range   Group A Strep by PCR NOT DETECTED NOT DETECTED    Comment: Performed at [facility], [address] [place], [place] [address]  Influenza panel by PCR (type A & B)     Status: Abnormal   Collection Time: 08/10/18  3:15 AM  Result Value Ref Range   Influenza A By PCR POSITIVE (A) NEGATIVE   Influenza B By PCR NEGATIVE NEGATIVE    Comment: (NOTE) The Xpert Xpress Flu assay is intended as an aid in the diagnosis of  influenza and should not be used as a sole basis for treatment.  This  assay is FDA approved for nasopharyngeal swab specimens only. Nasal  washings and aspirates are unacceptable for Xpert Xpress Flu testing. Performed at [facility], [address] [place], [place]  [address]   CBC with Differential     Status: None   Collection Time: 08/10/18  5:00 AM  Result Value Ref Range   WBC 7.2 4.0 - 10.5 K/uL   RBC 4.45 3.87 - 5.11 MIL/uL   Hemoglobin 13.0 12.0 - 15.0 g/dL   HCT 40.6 36.0 - 46.0 %   MCV 91.2 80.0 - 100.0 fL   MCH 29.2 26.0 - 34.0 pg   MCHC 32.0 30.0 - 36.0 g/dL   RDW 12.3 11.5 - 15.5 %   Platelets 229 150 - 400 K/uL   nRBC 0.0 0.0 - 0.2 %   Neutrophils Relative % 74 %   Neutro Abs 5.2 1.7 - 7.7 K/uL   Lymphocytes Relative 12 %   Lymphs Abs 0.9 0.7 - 4.0 K/uL   Monocytes Relative 13 %   Monocytes Absolute 0.9 0.1 - 1.0 K/uL   Eosinophils Relative 1 %   Eosinophils Absolute 0.1 0.0 - 0.5 K/uL   Basophils Relative 0 %   Basophils Absolute 0.0 0.0 - 0.1 K/uL   Immature Granulocytes 0 %   Abs Immature Granulocytes 0.02 0.00 - 0.07 K/uL    Comment: Performed at [facility], [address] [place], [place] [address]  Comprehensive metabolic panel     Status: Abnormal   Collection Time: 08/10/18  5:00 AM  Result Value Ref Range   Sodium 135 135 - 145 mmol/L   Potassium 3.6 3.5 - 5.1 mmol/L  Chloride 100 98 - 111 mmol/L   CO2 26 22 - 32 mmol/L   Glucose, Bld 288 (H) 70 - 99 mg/dL   BUN 7 6 - 20 mg/dL   Creatinine, Ser 0.64 0.44 - 1.00 mg/dL   Calcium 8.8 (L) 8.9 - 10.3 mg/dL   Total Protein 7.5 6.5 - 8.1 g/dL   Albumin 3.8 3.5 - 5.0 g/dL   AST 29 15 - 41 U/L   ALT 26 0 - 44 U/L   Alkaline Phosphatase 103 38 - 126 U/L   Total Bilirubin 0.5 0.3 - 1.2 mg/dL   GFR calc non Af Amer >60 >60 mL/min   GFR calc Af Amer >60 >60 mL/min   Anion gap 9 5 - 15    Comment: Performed at [facility], [address], [place], [place] [address]  Magnesium     Status: None   Collection Time: 08/10/18  5:00 AM  Result Value Ref Range   Magnesium 1.9 1.7 - 2.4 mg/dL    Comment: Performed at [facility], [address] [place], [place] [address]  Phosphorus     Status: None   Collection Time: 08/10/18  5:00 AM  Result Value Ref Range   Phosphorus  3.4 2.5 - 4.6 mg/dL    Comment: Performed at [facility], [address] [place], [place] [address]  Urinalysis, Complete w Microscopic     Status: Abnormal   Collection Time: 08/10/18  7:39 AM  Result Value Ref Range   Color, Urine STRAW (A) YELLOW   APPearance CLEAR CLEAR   Specific Gravity, Urine 1.015 1.005 - 1.030   pH 6.0 5.0 - 8.0   Glucose, UA >=500 (A) NEGATIVE mg/dL   Hgb urine dipstick NEGATIVE NEGATIVE   Bilirubin Urine NEGATIVE NEGATIVE   Ketones, ur NEGATIVE NEGATIVE mg/dL   Protein, ur NEGATIVE NEGATIVE mg/dL   Nitrite NEGATIVE NEGATIVE   Leukocytes, UA TRACE (A) NEGATIVE   RBC / HPF 0-5 0 - 5 RBC/hpf   WBC, UA 11-20 0 - 5 WBC/hpf   Bacteria, UA RARE (A) NONE SEEN   Squamous Epithelial / LPF 0-5 0 - 5    Comment: Performed at [facility], [address], [place], [place] [address]  Culture, blood (Routine X 2) w Reflex to ID Panel     Status: None (Preliminary result)   Collection Time: 08/10/18  7:58 AM  Result Value Ref Range   Specimen Description      RIGHT ANTECUBITAL BOTTLES DRAWN AEROBIC AND ANAEROBIC   Special Requests      Blood Culture results may not be optimal due to an inadequate volume of blood received in culture bottles   Culture      NO GROWTH < 24 HOURS Performed at [facility], [address], [place], [place] [address]    Report Status PENDING   Culture, blood (Routine X 2) w Reflex to ID Panel     Status: None (Preliminary result)   Collection Time: 08/10/18  7:58 AM  Result Value Ref Range   Specimen Description      BLOOD RIGHT ARM BOTTLES DRAWN AEROBIC AND ANAEROBIC   Special Requests      Blood Culture results may not be optimal due to an inadequate volume of blood received in culture bottles   Culture      NO GROWTH < 24 HOURS Performed at [facility], [address], [place], [place] [address]    Report Status PENDING   Lactic acid, plasma  Status: Abnormal   Collection Time: 08/10/18  7:58 AM  Result Value Ref Range    Lactic Acid, Venous 3.0 (HH) 0.5 - 1.9 mmol/L    Comment: CRITICAL RESULT CALLED TO, READ BACK BY AND VERIFIED WITH: HITT AT 0847 ON 08/10/2018 BY MOSLEY,J Performed at [facility], [address] [place], [place] [address]   Procalcitonin - Baseline     Status: None   Collection Time: 08/10/18  7:58 AM  Result Value Ref Range   Procalcitonin <0.10 ng/mL    Comment:        Interpretation: PCT (Procalcitonin) <= 0.5 ng/mL: Systemic infection (sepsis) is not likely. Local bacterial infection is possible. (NOTE)       Sepsis PCT Algorithm           Lower Respiratory Tract                                      Infection PCT Algorithm    ----------------------------     ----------------------------         PCT < 0.25 ng/mL                PCT < 0.10 ng/mL         Strongly encourage             Strongly discourage   discontinuation of antibiotics    initiation of antibiotics    ----------------------------     -----------------------------       PCT 0.25 - 0.50 ng/mL            PCT 0.10 - 0.25 ng/mL               OR       >80% decrease in PCT            Discourage initiation of                                            antibiotics      Encourage discontinuation           of antibiotics    ----------------------------     -----------------------------         PCT >= 0.50 ng/mL              PCT 0.26 - 0.50 ng/mL               AND        <80% decrease in PCT             Encourage initiation of                                             antibiotics       Encourage continuation           of antibiotics    ----------------------------     -----------------------------        PCT >= 0.50 ng/mL                  PCT > 0.50 ng/mL               AND  increase in PCT                  Strongly encourage                                      initiation of antibiotics    Strongly encourage escalation           of antibiotics                                      -----------------------------                                           PCT <= 0.25 ng/mL                                                 OR                                        > 80% decrease in PCT                                     Discontinue / Do not initiate                                             antibiotics Performed at [facility], [address], [place], [place] [address]   Glucose, capillary     Status: Abnormal   Collection Time: 08/10/18 11:44 AM  Result Value Ref Range   Glucose-Capillary 313 (H) 70 - 99 mg/dL  HIV antibody (Routine Testing)     Status: None   Collection Time: 08/10/18  1:05 PM  Result Value Ref Range   HIV Screen 4th Generation wRfx Non Reactive Non Reactive    Comment: (NOTE) Performed At: [facility] [place], [place] [id number] Rush Farmer MD [id number]   Lactic acid, plasma     Status: Abnormal   Collection Time: 08/10/18  1:05 PM  Result Value Ref Range   Lactic Acid, Venous 3.6 (HH) 0.5 - 1.9 mmol/L    Comment: CRITICAL RESULT CALLED TO, READ BACK BY AND VERIFIED WITH: BROWN,S AT 1336 ON 08/10/2018 BY MOSLEY,J Performed at [facility], [address] [place],  [address]   Glucose, capillary     Status: Abnormal   Collection Time: 08/10/18  4:25 PM  Result Value Ref Range   Glucose-Capillary 309 (H) 70 - 99 mg/dL  MRSA PCR Screening     Status: None   Collection Time: 08/10/18  6:00 PM  Result Value Ref Range   MRSA by PCR NEGATIVE NEGATIVE    Comment:        The GeneXpert MRSA Assay (FDA approved for NASAL specimens only), is one component of a comprehensive MRSA colonization surveillance program.  It is not intended to diagnose MRSA infection nor to guide or monitor treatment for MRSA infections. Performed at [facility], [address], [place], [place] [address]   Glucose, capillary     Status: Abnormal   Collection Time: 08/10/18  9:44 PM  Result Value Ref Range   Glucose-Capillary  182 (H) 70 - 99 mg/dL   Comment 1 Notify RN    Comment 2 Document in Chart   Basic metabolic panel     Status: Abnormal   Collection Time: 08/11/18  5:16 AM  Result Value Ref Range   Sodium 136 135 - 145 mmol/L   Potassium 3.9 3.5 - 5.1 mmol/L   Chloride 108 98 - 111 mmol/L   CO2 22 22 - 32 mmol/L   Glucose, Bld 183 (H) 70 - 99 mg/dL   BUN 7 6 - 20 mg/dL   Creatinine, Ser 0.57 0.44 - 1.00 mg/dL   Calcium 8.0 (L) 8.9 - 10.3 mg/dL   GFR calc non Af Amer >60 >60 mL/min   GFR calc Af Amer >60 >60 mL/min   Anion gap 6 5 - 15    Comment: Performed at [facility], [address] [place], [place] [address]  CBC     Status: Abnormal   Collection Time: 08/11/18  5:16 AM  Result Value Ref Range   WBC 4.8 4.0 - 10.5 K/uL   RBC 3.56 (L) 3.87 - 5.11 MIL/uL   Hemoglobin 10.5 (L) 12.0 - 15.0 g/dL   HCT 32.9 (L) 36.0 - 46.0 %   MCV 92.4 80.0 - 100.0 fL   MCH 29.5 26.0 - 34.0 pg   MCHC 31.9 30.0 - 36.0 g/dL   RDW 12.2 11.5 - 15.5 %   Platelets 165 150 - 400 K/uL   nRBC 0.0 0.0 - 0.2 %    Comment: Performed at [facility], [address] [place], [place] [address]  Procalcitonin     Status: None   Collection Time: 08/11/18  5:16 AM  Result Value Ref Range   Procalcitonin <0.10 ng/mL    Comment:        Interpretation: PCT (Procalcitonin) <= 0.5 ng/mL: Systemic infection (sepsis) is not likely. Local bacterial infection is possible. (NOTE)       Sepsis PCT Algorithm           Lower Respiratory Tract                                      Infection PCT Algorithm    ----------------------------     ----------------------------         PCT < 0.25 ng/mL                PCT < 0.10 ng/mL         Strongly encourage             Strongly discourage   discontinuation of antibiotics    initiation of antibiotics    ----------------------------     -----------------------------       PCT 0.25 - 0.50 ng/mL            PCT 0.10 - 0.25 ng/mL               OR       >80% decrease in PCT             Discourage initiation of  antibiotics      Encourage discontinuation           of antibiotics    ----------------------------     -----------------------------         PCT >= 0.50 ng/mL              PCT 0.26 - 0.50 ng/mL               AND        <80% decrease in PCT             Encourage initiation of                                             antibiotics       Encourage continuation           of antibiotics    ----------------------------     -----------------------------        PCT >= 0.50 ng/mL                  PCT > 0.50 ng/mL               AND         increase in PCT                  Strongly encourage                                      initiation of antibiotics    Strongly encourage escalation           of antibiotics                                     -----------------------------                                           PCT <= 0.25 ng/mL                                                 OR                                        > 80% decrease in PCT                                     Discontinue / Do not initiate                                             antibiotics Performed at [facility], [address], [place], [place] [address]   Glucose, capillary     Status: Abnormal   Collection Time: 08/11/18  7:36 AM  Result Value Ref Range   Glucose-Capillary 168 (H) 70 - 99 mg/dL    ABGS No results for input(s): PHART, PO2ART, TCO2, HCO3 in the last 72 hours.  Invalid input(s): PCO2 CULTURES Recent Results (from the past 240 hour(s))  Group A Strep by PCR     Status: None   Collection Time: 08/10/18  3:15 AM  Result Value Ref Range Status   Group A Strep by PCR NOT DETECTED NOT DETECTED Final    Comment: Performed at [facility], [address] [place], [place] [address]  Culture, blood (Routine X 2) w Reflex to ID Panel     Status: None (Preliminary result)   Collection Time: 08/10/18  7:58 AM  Result Value Ref Range  Status   Specimen Description   Final    RIGHT ANTECUBITAL BOTTLES DRAWN AEROBIC AND ANAEROBIC   Special Requests   Final    Blood Culture results may not be optimal due to an inadequate volume of blood received in culture bottles   Culture   Final    NO GROWTH < 24 HOURS Performed at [facility], [address], [place], [place] [address]    Report Status PENDING  Incomplete  Culture, blood (Routine X 2) w Reflex to ID Panel     Status: None (Preliminary result)   Collection Time: 08/10/18  7:58 AM  Result Value Ref Range Status   Specimen Description   Final    BLOOD RIGHT ARM BOTTLES DRAWN AEROBIC AND ANAEROBIC   Special Requests   Final    Blood Culture results may not be optimal due to an inadequate volume of blood received in culture bottles   Culture   Final    NO GROWTH < 24 HOURS Performed at [facility], [address], [place], [place] [address]    Report Status PENDING  Incomplete  MRSA PCR Screening     Status: None   Collection Time: 08/10/18  6:00 PM  Result Value Ref Range Status   MRSA by PCR NEGATIVE NEGATIVE Final    Comment:        The GeneXpert MRSA Assay (FDA approved for NASAL specimens only), is one component of a comprehensive MRSA colonization surveillance program. It is not intended to diagnose MRSA infection nor to guide or monitor treatment for MRSA infections. Performed at [facility], [address] [place], [place] [address]    Studies/Results: Dg Chest 2 View  Result Date: 08/10/2018 CLINICAL DATA:  48 y/o  F; sore throat, cough, congestion. EXAM: CHEST - 2 VIEW COMPARISON:  11/20/2014 chest radiograph FINDINGS: Stable normal cardiac silhouette given projection and technique. Streaky opacities at the lung bases. No focal consolidation. No pleural effusion or pneumothorax. No acute osseous abnormality is evident. IMPRESSION: Streaky opacities at the lung bases probably representing bronchitic changes. No focal consolidation. Electronically  Signed   By: Kristine Garbe M.D.   On: 08/10/2018 04:51    Medications:  Prior to Admission:  Medications Prior to Admission  Medication Sig Dispense Refill Last Dose  . amphetamine-dextroamphetamine (ADDERALL) 20 MG tablet Take 1 tablet (20 mg total) by mouth 2 (two) times daily. 60 tablet 0 Past Week at Unknown time  . Ascorbic Acid (VITAMIN C) 1000 MG tablet Take 1,000 mg by mouth every morning.    Past Week at Unknown time  . aspirin 325 MG EC tablet Take 325 mg by mouth daily.   Past Week at Unknown time  . atorvastatin (LIPITOR) 80 MG tablet Take  1 tablet by mouth daily.   Past Week at Unknown time  . azaTHIOprine (IMURAN) 50 MG tablet TAKE 1 TABLET BY MOUTH ONCE DAILY 30 tablet 5 Past Week at Unknown time  . calcium-vitamin D (OS-CAL 500 + D) 500-200 MG-UNIT per tablet Take 1 tablet by mouth 2 (two) times daily.    Past Week at Unknown time  . CIPRODEX otic suspension Place 2 drops into the left ear as needed. Reported on 10/04/2015   Past Week at Unknown time  . dicyclomine (BENTYL) 10 MG capsule Take 1 capsule (10 mg total) by mouth daily as needed. For cramps 90 capsule 5 Past Week at Unknown time  . DULoxetine (CYMBALTA) 60 MG capsule TAKE 1 CAPSULE(60 MG) BY MOUTH TWICE DAILY 180 capsule 2 Past Week at Unknown time  . esomeprazole (NEXIUM) 40 MG capsule TAKE 1 CAPSULE BY MOUTH EVERY MORNING 90 capsule 3 Past Week at Unknown time  . fexofenadine (ALLEGRA) 60 MG tablet Take 1 tablet by mouth 2 (two) times daily as needed.   Past Week at Unknown time  . furosemide (LASIX) 40 MG tablet Take 40 mg by mouth every other day.    Past Week at Unknown time  . gabapentin (NEURONTIN) 300 MG capsule Take 300 mg by mouth at bedtime as needed (for pain).    Past Week at Unknown time  . GARLIC PO Take 2,536 mg by mouth daily.    Past Week at Unknown time  . hydrALAZINE (APRESOLINE) 50 MG tablet Take 1 tablet (50 mg total) by mouth every 8 (eight) hours. 90 tablet 5 Past Week at Unknown time   . isosorbide mononitrate (IMDUR) 30 MG 24 hr tablet TAKE ONE (1) TABLET EACH DAY 30 tablet 11 Past Week at Unknown time  . lisinopril (PRINIVIL,ZESTRIL) 40 MG tablet Take 40 mg by mouth daily.    Past Week at Unknown time  . metFORMIN (GLUCOPHAGE) 500 MG tablet Take 500 mg by mouth 2 (two) times daily with a meal.    Past Week at Unknown time  . metoprolol succinate (TOPROL-XL) 100 MG 24 hr tablet TAKE 1 TABLET BY MOUTH ONCE DAILY WITH FOOD 30 tablet 0 Past Week at Unknown time  . Multiple Vitamins-Minerals (CENTRUM) tablet Take 1 tablet by mouth daily.   Past Week at Unknown time  . pramipexole (MIRAPEX) 0.25 MG tablet Take 0.25 mg by mouth at bedtime as needed. For restless legs   Past Week at Unknown time  . prazosin (MINIPRESS) 2 MG capsule Take 1 capsule (2 mg total) by mouth at bedtime. 90 capsule 2 Past Week at Unknown time  . PROAIR HFA 108 (90 BASE) MCG/ACT inhaler Inhale 1-2 puffs into the lungs every 6 (six) hours as needed for wheezing or shortness of breath.    Past Month at Unknown time  . Probiotic Product (RESTORA PO) Take by mouth daily.    Past Week at Unknown time  . ammonium lactate (AMLACTIN) 12 % cream    unknown  . loperamide (IMODIUM) 2 MG capsule Take 1 capsule (2 mg total) by mouth 4 (four) times daily as needed for diarrhea or loose stools. 12 capsule 0 unknown  . ondansetron (ZOFRAN) 4 MG tablet Take 1 tablet (4 mg total) by mouth every 6 (six) hours. 12 tablet 0 unknown   Scheduled: . amphetamine-dextroamphetamine  20 mg Oral BID  . aspirin  325 mg Oral Daily  . atorvastatin  80 mg Oral Daily  . calcium-vitamin D  1 tablet  Oral BID  . dextromethorphan-guaiFENesin  1 tablet Oral BID  . DULoxetine  60 mg Oral Daily  . enoxaparin (LOVENOX) injection  40 mg Subcutaneous Q24H  . insulin aspart  0-9 Units Subcutaneous TID WC  . ipratropium-albuterol  3 mL Nebulization TID  . isosorbide mononitrate  30 mg Oral Daily  . metoprolol succinate  50 mg Oral Daily  .  oseltamivir  75 mg Oral Q12H  . pantoprazole  80 mg Oral Q1200  . vitamin C  1,000 mg Oral q morning - 10a   Continuous: . sodium chloride 75 mL/hr at 08/10/18 1938  . ceFEPime (MAXIPIME) IV 2 g (08/11/18 0001)  . magnesium sulfate 1 - 4 g bolus IVPB    . vancomycin     GYF:[id number] **OR** acetaminophen, albuterol, gabapentin, ketorolac, ondansetron **OR** ondansetron (ZOFRAN) IV, pramipexole  Assesment: She was admitted with influenza A and she had systemic inflammatory response syndrome from that.  She still has fever but her pulse rate is better.  I think the systemic inflammatory response pathophysiology has resolved  She has hypertension at baseline and her blood pressures not totally controlled now  She had previous stroke but no new symptoms  She has autoimmune hepatitis and she is on immunosuppressive's which are being held  She complains of left hip pain so I am going to x-ray that Active Problems:   Autoimmune hepatitis ([place])   Hypertension   Influenza A   SIRS (systemic inflammatory response syndrome) ([place])    Plan: X-ray left hip.  Continue everything else.    LOS: 0 days   Alonza Bogus 08/11/2018, 10:22 AM

## 2018-08-12 DIAGNOSIS — J101 Influenza due to other identified influenza virus with other respiratory manifestations: Secondary | ICD-10-CM | POA: Diagnosis not present

## 2018-08-12 DIAGNOSIS — I10 Essential (primary) hypertension: Secondary | ICD-10-CM | POA: Diagnosis not present

## 2018-08-12 DIAGNOSIS — E1165 Type 2 diabetes mellitus with hyperglycemia: Secondary | ICD-10-CM | POA: Diagnosis not present

## 2018-08-12 LAB — CBC
HEMATOCRIT: 32.9 % — AB (ref 36.0–46.0)
Hemoglobin: 10.6 g/dL — ABNORMAL LOW (ref 12.0–15.0)
MCH: 29.7 pg (ref 26.0–34.0)
MCHC: 32.2 g/dL (ref 30.0–36.0)
MCV: 92.2 fL (ref 80.0–100.0)
Platelets: 167 10*3/uL (ref 150–400)
RBC: 3.57 MIL/uL — ABNORMAL LOW (ref 3.87–5.11)
RDW: 12.4 % (ref 11.5–15.5)
WBC: 3.3 10*3/uL — ABNORMAL LOW (ref 4.0–10.5)
nRBC: 0 % (ref 0.0–0.2)

## 2018-08-12 LAB — URINE CULTURE: Culture: NO GROWTH

## 2018-08-12 LAB — BASIC METABOLIC PANEL
ANION GAP: 5 (ref 5–15)
BUN: 12 mg/dL (ref 6–20)
CO2: 25 mmol/L (ref 22–32)
Calcium: 8.2 mg/dL — ABNORMAL LOW (ref 8.9–10.3)
Chloride: 108 mmol/L (ref 98–111)
Creatinine, Ser: 0.51 mg/dL (ref 0.44–1.00)
GFR calc Af Amer: >60 mL/min (ref >60)
GFR calc non Af Amer: >60 mL/min (ref >60)
Glucose, Bld: 229 mg/dL — ABNORMAL HIGH (ref 70–99)
Potassium: 4.1 mmol/L (ref 3.5–5.1)
Sodium: 138 mmol/L (ref 135–145)

## 2018-08-12 LAB — GLUCOSE, CAPILLARY
Glucose-Capillary: 258 mg/dL — ABNORMAL HIGH (ref 70–99)
Glucose-Capillary: 227 mg/dL — ABNORMAL HIGH (ref 70–99)

## 2018-08-12 LAB — PROCALCITONIN: Procalcitonin: <0.1 ng/mL

## 2018-08-12 MED ORDER — OSELTAMIVIR PHOSPHATE 75 MG PO CAPS: 75.0000 mg | ORAL_CAPSULE | Freq: Two times a day (BID) | ORAL | 0 refills | Status: AC

## 2018-08-12 MED ORDER — AZITHROMYCIN 250 MG PO TABS: ORAL_TABLET | ORAL | 0 refills | Status: DC

## 2018-08-12 NOTE — Progress Notes (Signed)
Discharge instructions reviewed with patient. Given AVS. Patient requested "breathing treatments for home and nebulizer." notified Dr. Luan Pulling, stated he called prescription into [facility] for the medication and [facility] for the nebulizer machine. Notified patient. IV site removed by nurse tech, site within normal limits. Pt left floor in stable condition via w/c accompanied by nurse tech. Discharged home. Donavan Foil, RN

## 2018-08-12 NOTE — Progress Notes (Signed)
Subjective: She says she feels substantially better and wants to go home.  She is no longer having fever.  Her aching is much better.  Objective: Vital signs in last 24 hours: Temp:  [98.1 F (36.7 C)-98.8 F (37.1 C)] 98.1 F (36.7 C) (02/03 0556) Pulse Rate:  [82-87] 83 (02/03 0556) Resp:  [16-20] 18 (02/03 0512) BP: (117-142)/(80-100) 142/100 (02/03 0556) SpO2:  [93 %-97 %] 93 % (02/03 0556) Weight change:  Last BM Date: 08/09/18  Intake/Output from previous day: 02/02 0701 - 02/03 0700 In: 2327.7 [P.O.:960; I.V.:1164.3; IV Piggyback:203.4] Out: 500 [Urine:500]  PHYSICAL EXAM General appearance: alert, cooperative and no distress Resp: clear to auscultation bilaterally Cardio: regular rate and rhythm, S1, S2 normal, no murmur, click, rub or gallop GI: soft, non-tender; bowel sounds normal; no masses,  no organomegaly Extremities: extremities normal, atraumatic, no cyanosis or edema  Lab Results:  Results for orders placed or performed during the hospital encounter of 08/10/18 (from the past 48 hour(s))  Glucose, capillary     Status: Abnormal   Collection Time: 08/10/18 11:44 AM  Result Value Ref Range   Glucose-Capillary 313 (H) 70 - 99 mg/dL  HIV antibody (Routine Testing)     Status: None   Collection Time: 08/10/18  1:05 PM  Result Value Ref Range   HIV Screen 4th Generation wRfx Non Reactive Non Reactive    Comment: (NOTE) Performed At: [facility] [place], [place] [id number] Rush Farmer MD OE:[id number]   Lactic acid, plasma     Status: Abnormal   Collection Time: 08/10/18  1:05 PM  Result Value Ref Range   Lactic Acid, Venous 3.6 (HH) 0.5 - 1.9 mmol/L    Comment: CRITICAL RESULT CALLED TO, READ BACK BY AND VERIFIED WITH: BROWN,S AT 1336 ON 08/10/2018 BY MOSLEY,J Performed at [facility], [address] [place], [place] [address]   Glucose, capillary     Status: Abnormal   Collection Time: 08/10/18  4:25 PM  Result Value Ref  Range   Glucose-Capillary 309 (H) 70 - 99 mg/dL  MRSA PCR Screening     Status: None   Collection Time: 08/10/18  6:00 PM  Result Value Ref Range   MRSA by PCR NEGATIVE NEGATIVE    Comment:        The GeneXpert MRSA Assay (FDA approved for NASAL specimens only), is one component of a comprehensive MRSA colonization surveillance program. It is not intended to diagnose MRSA infection nor to guide or monitor treatment for MRSA infections. Performed at [facility], [address], [place], [place] [address]   Glucose, capillary     Status: Abnormal   Collection Time: 08/10/18  9:44 PM  Result Value Ref Range   Glucose-Capillary 182 (H) 70 - 99 mg/dL   Comment 1 Notify RN    Comment 2 Document in Chart   Basic metabolic panel     Status: Abnormal   Collection Time: 08/11/18  5:16 AM  Result Value Ref Range   Sodium 136 135 - 145 mmol/L   Potassium 3.9 3.5 - 5.1 mmol/L   Chloride 108 98 - 111 mmol/L   CO2 22 22 - 32 mmol/L   Glucose, Bld 183 (H) 70 - 99 mg/dL   BUN 7 6 - 20 mg/dL   Creatinine, Ser 0.57 0.44 - 1.00 mg/dL   Calcium 8.0 (L) 8.9 - 10.3 mg/dL   GFR calc non Af Amer >60 >60 mL/min   GFR calc Af Amer >60 >60  mL/min   Anion gap 6 5 - 15    Comment: Performed at [facility], [address] [place], [place] [address]  CBC     Status: Abnormal   Collection Time: 08/11/18  5:16 AM  Result Value Ref Range   WBC 4.8 4.0 - 10.5 K/uL   RBC 3.56 (L) 3.87 - 5.11 MIL/uL   Hemoglobin 10.5 (L) 12.0 - 15.0 g/dL   HCT 32.9 (L) 36.0 - 46.0 %   MCV 92.4 80.0 - 100.0 fL   MCH 29.5 26.0 - 34.0 pg   MCHC 31.9 30.0 - 36.0 g/dL   RDW 12.2 11.5 - 15.5 %   Platelets 165 150 - 400 K/uL   nRBC 0.0 0.0 - 0.2 %    Comment: Performed at [facility], [address] [place], [place] [address]  Hemoglobin A1c     Status: Abnormal   Collection Time: 08/11/18  5:16 AM  Result Value Ref Range   Hgb A1c MFr Bld 9.9 (H) 4.8 - 5.6 %    Comment: (NOTE) Pre diabetes:           5.7%-6.4% Diabetes:              >6.4% Glycemic control for   <7.0% adults with diabetes    Mean Plasma Glucose 237.43 mg/dL    Comment: Performed at [facility], [place] [address], [place], [place] [address]  Procalcitonin     Status: None   Collection Time: 08/11/18  5:16 AM  Result Value Ref Range   Procalcitonin <0.10 ng/mL    Comment:        Interpretation: PCT (Procalcitonin) <= 0.5 ng/mL: Systemic infection (sepsis) is not likely. Local bacterial infection is possible. (NOTE)       Sepsis PCT Algorithm           Lower Respiratory Tract                                      Infection PCT Algorithm    ----------------------------     ----------------------------         PCT < 0.25 ng/mL                PCT < 0.10 ng/mL         Strongly encourage             Strongly discourage   discontinuation of antibiotics    initiation of antibiotics    ----------------------------     -----------------------------       PCT 0.25 - 0.50 ng/mL            PCT 0.10 - 0.25 ng/mL               OR       >80% decrease in PCT            Discourage initiation of                                            antibiotics      Encourage discontinuation           of antibiotics    ----------------------------     -----------------------------         PCT >= 0.50 ng/mL  PCT 0.26 - 0.50 ng/mL               AND        <80% decrease in PCT             Encourage initiation of                                             antibiotics       Encourage continuation           of antibiotics    ----------------------------     -----------------------------        PCT >= 0.50 ng/mL                  PCT > 0.50 ng/mL               AND         increase in PCT                  Strongly encourage                                      initiation of antibiotics    Strongly encourage escalation           of antibiotics                                     -----------------------------                                            PCT <= 0.25 ng/mL                                                 OR                                        > 80% decrease in PCT                                     Discontinue / Do not initiate                                             antibiotics Performed at [facility], [address] [place], [place] [address]   Glucose, capillary     Status: Abnormal   Collection Time: 08/11/18  7:36 AM  Result Value Ref Range   Glucose-Capillary 168 (H) 70 - 99 mg/dL  Glucose, capillary     Status: Abnormal   Collection Time: 08/11/18 11:12 AM  Result Value Ref Range   Glucose-Capillary 220 (H) 70 - 99 mg/dL  Glucose, capillary     Status: Abnormal  Collection Time: 08/11/18  4:04 PM  Result Value Ref Range   Glucose-Capillary 213 (H) 70 - 99 mg/dL  Glucose, capillary     Status: Abnormal   Collection Time: 08/11/18 10:00 PM  Result Value Ref Range   Glucose-Capillary 204 (H) 70 - 99 mg/dL  Procalcitonin     Status: None   Collection Time: 08/12/18  5:10 AM  Result Value Ref Range   Procalcitonin <0.10 ng/mL    Comment:        Interpretation: PCT (Procalcitonin) <= 0.5 ng/mL: Systemic infection (sepsis) is not likely. Local bacterial infection is possible. (NOTE)       Sepsis PCT Algorithm           Lower Respiratory Tract                                      Infection PCT Algorithm    ----------------------------     ----------------------------         PCT < 0.25 ng/mL                PCT < 0.10 ng/mL         Strongly encourage             Strongly discourage   discontinuation of antibiotics    initiation of antibiotics    ----------------------------     -----------------------------       PCT 0.25 - 0.50 ng/mL            PCT 0.10 - 0.25 ng/mL               OR       >80% decrease in PCT            Discourage initiation of                                            antibiotics      Encourage discontinuation           of antibiotics     ----------------------------     -----------------------------         PCT >= 0.50 ng/mL              PCT 0.26 - 0.50 ng/mL               AND        <80% decrease in PCT             Encourage initiation of                                             antibiotics       Encourage continuation           of antibiotics    ----------------------------     -----------------------------        PCT >= 0.50 ng/mL                  PCT > 0.50 ng/mL               AND         increase in PCT  Strongly encourage                                      initiation of antibiotics    Strongly encourage escalation           of antibiotics                                     -----------------------------                                           PCT <= 0.25 ng/mL                                                 OR                                        > 80% decrease in PCT                                     Discontinue / Do not initiate                                             antibiotics Performed at [facility], [address] [place], [place] [address]   Basic metabolic panel     Status: Abnormal   Collection Time: 08/12/18  5:10 AM  Result Value Ref Range   Sodium 138 135 - 145 mmol/L   Potassium 4.1 3.5 - 5.1 mmol/L   Chloride 108 98 - 111 mmol/L   CO2 25 22 - 32 mmol/L   Glucose, Bld 229 (H) 70 - 99 mg/dL   BUN 12 6 - 20 mg/dL   Creatinine, Ser 0.51 0.44 - 1.00 mg/dL   Calcium 8.2 (L) 8.9 - 10.3 mg/dL   GFR calc non Af Amer >60 >60 mL/min   GFR calc Af Amer >60 >60 mL/min   Anion gap 5 5 - 15    Comment: Performed at [facility], [address] [place], [place] [address]  CBC     Status: Abnormal   Collection Time: 08/12/18  5:10 AM  Result Value Ref Range   WBC 3.3 (L) 4.0 - 10.5 K/uL   RBC 3.57 (L) 3.87 - 5.11 MIL/uL   Hemoglobin 10.6 (L) 12.0 - 15.0 g/dL   HCT 32.9 (L) 36.0 - 46.0 %   MCV 92.2 80.0 - 100.0 fL   MCH 29.7 26.0 - 34.0 pg   MCHC 32.2 30.0 - 36.0 g/dL    RDW 12.4 11.5 - 15.5 %   Platelets 167 150 - 400 K/uL   nRBC 0.0 0.0 - 0.2 %    Comment: Performed at [facility], [address], [place], [place] [address]  Glucose, capillary     Status: Abnormal   Collection  Time: 08/12/18  7:34 AM  Result Value Ref Range   Glucose-Capillary 258 (H) 70 - 99 mg/dL    ABGS No results for input(s): PHART, PO2ART, TCO2, HCO3 in the last 72 hours.  Invalid input(s): PCO2 CULTURES Recent Results (from the past 240 hour(s))  Group A Strep by PCR     Status: None   Collection Time: 08/10/18  3:15 AM  Result Value Ref Range Status   Group A Strep by PCR NOT DETECTED NOT DETECTED Final    Comment: Performed at [facility], [address] [place], [place] [address]  Culture, Urine     Status: None   Collection Time: 08/10/18  7:39 AM  Result Value Ref Range Status   Specimen Description URINE, CLEAN CATCH  Final   Special Requests   Final    NONE Performed at [facility], [address], [place], [place] [address]    Culture NO GROWTH  Final   Report Status 08/12/2018 FINAL  Final  Culture, blood (Routine X 2) w Reflex to ID Panel     Status: None (Preliminary result)   Collection Time: 08/10/18  7:58 AM  Result Value Ref Range Status   Specimen Description   Final    RIGHT ANTECUBITAL BOTTLES DRAWN AEROBIC AND ANAEROBIC   Special Requests   Final    Blood Culture results may not be optimal due to an inadequate volume of blood received in culture bottles   Culture   Final    NO GROWTH 2 DAYS Performed at [facility], [address], [place], [place] [address]    Report Status PENDING  Incomplete  Culture, blood (Routine X 2) w Reflex to ID Panel     Status: None (Preliminary result)   Collection Time: 08/10/18  7:58 AM  Result Value Ref Range Status   Specimen Description   Final    BLOOD RIGHT ARM BOTTLES DRAWN AEROBIC AND ANAEROBIC   Special Requests   Final    Blood Culture results may not be optimal due to an inadequate volume of  blood received in culture bottles   Culture   Final    NO GROWTH 2 DAYS Performed at [facility], [address] [place], [place] [address]    Report Status PENDING  Incomplete  MRSA PCR Screening     Status: None   Collection Time: 08/10/18  6:00 PM  Result Value Ref Range Status   MRSA by PCR NEGATIVE NEGATIVE Final    Comment:        The GeneXpert MRSA Assay (FDA approved for NASAL specimens only), is one component of a comprehensive MRSA colonization surveillance program. It is not intended to diagnose MRSA infection nor to guide or monitor treatment for MRSA infections. Performed at [facility], [address], [place], [place] [address]    Studies/Results: Dg Hip Unilat With Pelvis 2-3 Views Left  Result Date: 08/11/2018 CLINICAL DATA:  Left hip pain after fall last week. EXAM: DG HIP (WITH OR WITHOUT PELVIS) 2-3V LEFT COMPARISON:  None. FINDINGS: There is no evidence of hip fracture or dislocation. There is no evidence of arthropathy or other focal bone abnormality. IMPRESSION: Negative. Electronically Signed   By: Marijo Conception, M.D.   On: 08/11/2018 12:22    Medications:  Prior to Admission:  Medications Prior to Admission  Medication Sig Dispense Refill Last Dose  . amphetamine-dextroamphetamine (ADDERALL) 20 MG tablet Take 1 tablet (20 mg total) by mouth 2 (two) times daily. [place]  tablet 0 Past Week at Unknown time  . Ascorbic Acid (VITAMIN C) 1000 MG tablet Take 1,000 mg by mouth every morning.    Past Week at Unknown time  . aspirin 325 MG EC tablet Take 325 mg by mouth daily.   Past Week at Unknown time  . atorvastatin (LIPITOR) 80 MG tablet Take 1 tablet by mouth daily.   Past Week at Unknown time  . azaTHIOprine (IMURAN) 50 MG tablet TAKE 1 TABLET BY MOUTH ONCE DAILY 30 tablet 5 Past Week at Unknown time  . calcium-vitamin D (OS-CAL 500 + D) 500-200 MG-UNIT per tablet Take 1 tablet by mouth 2 (two) times daily.    Past Week at Unknown time  . CIPRODEX otic  suspension Place 2 drops into the left ear as needed. Reported on 10/04/2015   Past Week at Unknown time  . dicyclomine (BENTYL) 10 MG capsule Take 1 capsule (10 mg total) by mouth daily as needed. For cramps 90 capsule 5 Past Week at Unknown time  . DULoxetine (CYMBALTA) 60 MG capsule TAKE 1 CAPSULE(60 MG) BY MOUTH TWICE DAILY 180 capsule 2 Past Week at Unknown time  . esomeprazole (NEXIUM) 40 MG capsule TAKE 1 CAPSULE BY MOUTH EVERY MORNING 90 capsule 3 Past Week at Unknown time  . fexofenadine (ALLEGRA) 60 MG tablet Take 1 tablet by mouth 2 (two) times daily as needed.   Past Week at Unknown time  . furosemide (LASIX) 40 MG tablet Take 40 mg by mouth every other day.    Past Week at Unknown time  . gabapentin (NEURONTIN) 300 MG capsule Take 300 mg by mouth at bedtime as needed (for pain).    Past Week at Unknown time  . GARLIC PO Take 9,326 mg by mouth daily.    Past Week at Unknown time  . hydrALAZINE (APRESOLINE) 50 MG tablet Take 1 tablet (50 mg total) by mouth every 8 (eight) hours. 90 tablet 5 Past Week at Unknown time  . isosorbide mononitrate (IMDUR) 30 MG 24 hr tablet TAKE ONE (1) TABLET EACH DAY 30 tablet 11 Past Week at Unknown time  . lisinopril (PRINIVIL,ZESTRIL) 40 MG tablet Take 40 mg by mouth daily.    Past Week at Unknown time  . metFORMIN (GLUCOPHAGE) 500 MG tablet Take 500 mg by mouth 2 (two) times daily with a meal.    Past Week at Unknown time  . metoprolol succinate (TOPROL-XL) 100 MG 24 hr tablet TAKE 1 TABLET BY MOUTH ONCE DAILY WITH FOOD 30 tablet 0 Past Week at Unknown time  . Multiple Vitamins-Minerals (CENTRUM) tablet Take 1 tablet by mouth daily.   Past Week at Unknown time  . pramipexole (MIRAPEX) 0.25 MG tablet Take 0.25 mg by mouth at bedtime as needed. For restless legs   Past Week at Unknown time  . prazosin (MINIPRESS) 2 MG capsule Take 1 capsule (2 mg total) by mouth at bedtime. 90 capsule 2 Past Week at Unknown time  . PROAIR HFA 108 (90 BASE) MCG/ACT inhaler  Inhale 1-2 puffs into the lungs every 6 (six) hours as needed for wheezing or shortness of breath.    Past Month at Unknown time  . Probiotic Product (RESTORA PO) Take by mouth daily.    Past Week at Unknown time  . ammonium lactate (AMLACTIN) 12 % cream    unknown  . loperamide (IMODIUM) 2 MG capsule Take 1 capsule (2 mg total) by mouth 4 (four) times daily as needed for diarrhea or loose stools.  12 capsule 0 unknown  . ondansetron (ZOFRAN) 4 MG tablet Take 1 tablet (4 mg total) by mouth every 6 (six) hours. 12 tablet 0 unknown   Scheduled: . amphetamine-dextroamphetamine  20 mg Oral BID  . aspirin  325 mg Oral Daily  . atorvastatin  80 mg Oral Daily  . calcium-vitamin D  1 tablet Oral BID  . dextromethorphan-guaiFENesin  1 tablet Oral BID  . DULoxetine  60 mg Oral Daily  . enoxaparin (LOVENOX) injection  40 mg Subcutaneous Q24H  . hydrALAZINE  50 mg Oral Q8H  . insulin aspart  0-9 Units Subcutaneous TID WC  . ipratropium-albuterol  3 mL Nebulization TID  . isosorbide mononitrate  30 mg Oral Daily  . metoprolol succinate  50 mg Oral Daily  . oseltamivir  75 mg Oral Q12H  . pantoprazole  80 mg Oral Q1200  . prazosin  2 mg Oral QHS  . vitamin C  1,000 mg Oral q morning - 10a   Continuous: . sodium chloride 75 mL/hr at 08/11/18 1754  . ceFEPime (MAXIPIME) IV 2 g (08/12/18 0053)  . magnesium sulfate 1 - 4 g bolus IVPB     LMB:[id number] **OR** acetaminophen, albuterol, gabapentin, ketorolac, ondansetron **OR** ondansetron (ZOFRAN) IV, pramipexole  Assesment: She was admitted with influenza A and has systemic inflammatory response syndrome from that.  She is markedly improved.  She wants to go home which I think is appropriate.  She has diabetes on sliding scale and she is being treated at home  She has autoimmune hepatitis and she has been off her immunosuppressive's while she was in the hospital  She has hypertension that is been difficult to control but is better now  She has  personal history of stroke Active Problems:   Autoimmune hepatitis ([place])   Hypertension   Influenza A   SIRS (systemic inflammatory response syndrome) ([place])    Plan: Discharge home today    LOS: 0 days   Lindsay Walsh 08/12/2018, 8:43 AM

## 2018-08-12 NOTE — Discharge Summary (Addendum)
Physician Discharge Summary  Patient ID: Lindsay Walsh MRN: [id number] DOB/AGE: 49-Apr-1972 49 y.o. Primary Care Physician:Tajah Schreiner, Percell Miller, MD Admit date: 08/10/2018 Discharge date: 08/12/2018    Discharge Diagnoses:   Active Problems:   Autoimmune hepatitis ([place])   Hypertension   Influenza A   SIRS (systemic inflammatory response syndrome) (HCC) Diabetes Personal history of stroke  Allergies as of 08/12/2018      Reactions   Doxycycline Shortness Of Breath, Rash   Fentanyl Shortness Of Breath, Itching, Other (See Comments)   Heart racing, SOB   Ceftin [cefuroxime Axetil]    Iohexol Other (See Comments)   Knots on body   Ivp Dye [iodinated Diagnostic Agents] Other (See Comments)   Knots on body   Norvasc [amlodipine Besylate]    Wellbutrin [bupropion Hcl] Other (See Comments)   unknown   Latex Rash   Tape Rash      Medication List    TAKE these medications   ammonium lactate 12 % cream Commonly known as:  AMLACTIN   amphetamine-dextroamphetamine 20 MG tablet Commonly known as:  ADDERALL Take 1 tablet (20 mg total) by mouth 2 (two) times daily.   aspirin 325 MG EC tablet Take 325 mg by mouth daily.   atorvastatin 80 MG tablet Commonly known as:  LIPITOR Take 1 tablet by mouth daily.   azaTHIOprine 50 MG tablet Commonly known as:  IMURAN TAKE 1 TABLET BY MOUTH ONCE DAILY   azithromycin 250 MG tablet Commonly known as:  ZITHROMAX Z-PAK Take by package instructions   CENTRUM tablet Take 1 tablet by mouth daily.   CIPRODEX OTIC suspension Generic drug:  ciprofloxacin-dexamethasone Place 2 drops into the left ear as needed. Reported on 10/04/2015   dicyclomine 10 MG capsule Commonly known as:  BENTYL Take 1 capsule (10 mg total) by mouth daily as needed. For cramps   DULoxetine 60 MG capsule Commonly known as:  CYMBALTA TAKE 1 CAPSULE(60 MG) BY MOUTH TWICE DAILY   esomeprazole 40 MG capsule Commonly known as:  NEXIUM TAKE 1 CAPSULE BY MOUTH EVERY  MORNING   fexofenadine 60 MG tablet Commonly known as:  ALLEGRA Take 1 tablet by mouth 2 (two) times daily as needed.   furosemide 40 MG tablet Commonly known as:  LASIX Take 40 mg by mouth every other day.   gabapentin 300 MG capsule Commonly known as:  NEURONTIN Take 300 mg by mouth at bedtime as needed (for pain).   GARLIC PO Take 3,235 mg by mouth daily.   hydrALAZINE 50 MG tablet Commonly known as:  APRESOLINE Take 1 tablet (50 mg total) by mouth every 8 (eight) hours.   isosorbide mononitrate 30 MG 24 hr tablet Commonly known as:  IMDUR TAKE ONE (1) TABLET EACH DAY   lisinopril 40 MG tablet Commonly known as:  PRINIVIL,ZESTRIL Take 40 mg by mouth daily.   loperamide 2 MG capsule Commonly known as:  IMODIUM Take 1 capsule (2 mg total) by mouth 4 (four) times daily as needed for diarrhea or loose stools.   metFORMIN 500 MG tablet Commonly known as:  GLUCOPHAGE Take 500 mg by mouth 2 (two) times daily with a meal.   metoprolol succinate 100 MG 24 hr tablet Commonly known as:  TOPROL-XL TAKE 1 TABLET BY MOUTH ONCE DAILY WITH FOOD   ondansetron 4 MG tablet Commonly known as:  ZOFRAN Take 1 tablet (4 mg total) by mouth every 6 (six) hours.   OS-CAL 500 + D 500-200 MG-UNIT tablet Generic drug:  calcium-vitamin  D Take 1 tablet by mouth 2 (two) times daily.   oseltamivir 75 MG capsule Commonly known as:  TAMIFLU Take 1 capsule (75 mg total) by mouth 2 (two) times daily for 5 days.   pramipexole 0.25 MG tablet Commonly known as:  MIRAPEX Take 0.25 mg by mouth at bedtime as needed. For restless legs   prazosin 2 MG capsule Commonly known as:  MINIPRESS Take 1 capsule (2 mg total) by mouth at bedtime.   PROAIR HFA 108 (90 Base) MCG/ACT inhaler Generic drug:  albuterol Inhale 1-2 puffs into the lungs every 6 (six) hours as needed for wheezing or shortness of breath.   RESTORA PO Take by mouth daily.   vitamin C 1000 MG tablet Take 1,000 mg by mouth every  morning.       Discharged Condition: Improved    Consults: None  Significant Diagnostic Studies: Dg Chest 2 View  Result Date: 08/10/2018 CLINICAL DATA:  48 y/o  F; sore throat, cough, congestion. EXAM: CHEST - 2 VIEW COMPARISON:  11/20/2014 chest radiograph FINDINGS: Stable normal cardiac silhouette given projection and technique. Streaky opacities at the lung bases. No focal consolidation. No pleural effusion or pneumothorax. No acute osseous abnormality is evident. IMPRESSION: Streaky opacities at the lung bases probably representing bronchitic changes. No focal consolidation. Electronically Signed   By: Kristine Garbe M.D.   On: 08/10/2018 04:51   Dg Hip Unilat With Pelvis 2-3 Views Left  Result Date: 08/11/2018 CLINICAL DATA:  Left hip pain after fall last week. EXAM: DG HIP (WITH OR WITHOUT PELVIS) 2-3V LEFT COMPARISON:  None. FINDINGS: There is no evidence of hip fracture or dislocation. There is no evidence of arthropathy or other focal bone abnormality. IMPRESSION: Negative. Electronically Signed   By: Marijo Conception, M.D.   On: 08/11/2018 12:22    Lab Results: Basic Metabolic Panel: Recent Labs    08/10/18 0500 08/11/18 0516 08/12/18 0510  NA 135 136 138  K 3.6 3.9 4.1  CL 100 108 108  CO2 26 22 25   GLUCOSE 288* 183* 229*  BUN 7 7 12   CREATININE 0.64 0.57 0.51  CALCIUM 8.8* 8.0* 8.2*  MG 1.9  --   --   PHOS 3.4  --   --    Liver Function Tests: Recent Labs    08/10/18 0500  AST 29  ALT 26  ALKPHOS 103  BILITOT 0.5  PROT 7.5  ALBUMIN 3.8     CBC: Recent Labs    08/10/18 0500 08/11/18 0516 08/12/18 0510  WBC 7.2 4.8 3.3*  NEUTROABS 5.2  --   --   HGB 13.0 10.5* 10.6*  HCT 40.6 32.9* 32.9*  MCV 91.2 92.4 92.2  PLT 229 165 167    Recent Results (from the past 240 hour(s))  Group A Strep by PCR     Status: None   Collection Time: 08/10/18  3:15 AM  Result Value Ref Range Status   Group A Strep by PCR NOT DETECTED NOT DETECTED Final     Comment: Performed at [facility], [address] [place], [place] [address]  Culture, Urine     Status: None   Collection Time: 08/10/18  7:39 AM  Result Value Ref Range Status   Specimen Description URINE, CLEAN CATCH  Final   Special Requests   Final    NONE Performed at [facility], [address] [place], [place] [address]    Culture NO GROWTH  Final   Report Status 08/12/2018  FINAL  Final  Culture, blood (Routine X 2) w Reflex to ID Panel     Status: None (Preliminary result)   Collection Time: 08/10/18  7:58 AM  Result Value Ref Range Status   Specimen Description   Final    RIGHT ANTECUBITAL BOTTLES DRAWN AEROBIC AND ANAEROBIC   Special Requests   Final    Blood Culture results may not be optimal due to an inadequate volume of blood received in culture bottles   Culture   Final    NO GROWTH 2 DAYS Performed at [facility], [address] [place], [place] [address]    Report Status PENDING  Incomplete  Culture, blood (Routine X 2) w Reflex to ID Panel     Status: None (Preliminary result)   Collection Time: 08/10/18  7:58 AM  Result Value Ref Range Status   Specimen Description   Final    BLOOD RIGHT ARM BOTTLES DRAWN AEROBIC AND ANAEROBIC   Special Requests   Final    Blood Culture results may not be optimal due to an inadequate volume of blood received in culture bottles   Culture   Final    NO GROWTH 2 DAYS Performed at [facility], [address] [place], [place] [address]    Report Status PENDING  Incomplete  MRSA PCR Screening     Status: None   Collection Time: 08/10/18  6:00 PM  Result Value Ref Range Status   MRSA by PCR NEGATIVE NEGATIVE Final    Comment:        The GeneXpert MRSA Assay (FDA approved for NASAL specimens only), is one component of a comprehensive MRSA colonization surveillance program. It is not intended to diagnose MRSA infection nor to guide or monitor treatment for MRSA infections. Performed at [facility], [address], [place], [place] [address]      Hospital Course: This is a 48 year old who came to the emergency department because of aching all over and fever.  She was found to have influenza A.  She was also found to have systemic inflammatory response syndrome.  She was treated with IV fluids antibiotics and Tamiflu and improved markedly.  By the time of discharge her pain was gone her fever was gone and she felt much better and back to baseline.  Discharge Exam: Blood pressure (!) 142/100, pulse 83, temperature 98.1 F (36.7 C), temperature source Oral, resp. rate 18, height 5' (1.524 m), weight 93.1 kg, SpO2 93 %. She is awake and alert.  Her chest is clear.  Heart is regular.  Disposition: Home    addendum:she requested a nebulizer. She has some element o fCOPD an has been wheezing because of the flu.  This seems reasonable so I will write rx  Signed: Alonza Bogus   08/12/2018, 8:46 AM

## 2018-08-13 DIAGNOSIS — J449 Chronic obstructive pulmonary disease, unspecified: Secondary | ICD-10-CM | POA: Diagnosis not present

## 2018-08-15 LAB — CULTURE, BLOOD (ROUTINE X 2)
Culture: NO GROWTH
Culture: NO GROWTH

## 2018-08-21 ENCOUNTER — Encounter: Payer: Self-pay | Admitting: "Endocrinology

## 2018-08-21 ENCOUNTER — Ambulatory Visit (INDEPENDENT_AMBULATORY_CARE_PROVIDER_SITE_OTHER): Payer: Medicare Other | Admitting: "Endocrinology

## 2018-08-21 VITALS — BP 151/95 | HR 87 | Ht 60.0 in | Wt 203.0 lb

## 2018-08-21 DIAGNOSIS — I10 Essential (primary) hypertension: Secondary | ICD-10-CM

## 2018-08-21 DIAGNOSIS — E1159 Type 2 diabetes mellitus with other circulatory complications: Secondary | ICD-10-CM | POA: Diagnosis not present

## 2018-08-21 DIAGNOSIS — E039 Hypothyroidism, unspecified: Secondary | ICD-10-CM

## 2018-08-21 DIAGNOSIS — E1169 Type 2 diabetes mellitus with other specified complication: Secondary | ICD-10-CM | POA: Insufficient documentation

## 2018-08-21 DIAGNOSIS — E782 Mixed hyperlipidemia: Secondary | ICD-10-CM | POA: Diagnosis not present

## 2018-08-21 NOTE — Progress Notes (Signed)
Endocrinology Consult Note       08/21/2018, 6:46 PM   Subjective:    Patient ID: Lindsay Walsh, female    DOB: 04-26-1971.  Lindsay Walsh is being seen in consultation for management of currently uncontrolled symptomatic diabetes requested by  Sinda Du, MD.   Past Medical History:  Diagnosis Date  . Angina    took SL nitro one week ago  . Arthritis    back  . Autoimmune hepatitis ([place])   . Chest pain    Normal stress echo in 2011; PVCs; pedal edema  . CVA (cerebral infarction)   . Depression   . Diabetes mellitus without complication ([place])   . Dyspareunia 05/06/2014  . Dysrhythmia    palpatations  . Fasting hyperglycemia   . Fibromyalgia   . Gastroesophageal reflux disease    Chronic abdominal pain; gastroparesis; globus hystericus; irritable bowel syndrome  . Hypertension   . Irritable bowel syndrome   . Narcotic dependence ([place])   . Overweight(278.02)   . PTSD (post-traumatic stress disorder)   . Pulmonary nodule   . Shortness of breath    with exertion  . Sleep apnea   . Sleep apnea    on CPAP machine  . Tobacco abuse    one pack per day; 35 pack years   Past Surgical History:  Procedure Laterality Date  . ABDOMINAL HYSTERECTOMY    . BACK SURGERY    . BLADDER SUSPENSION  09/12/2011   Procedure: TRANSVAGINAL TAPE (TVT) PROCEDURE;  Surgeon: [name], MD;  Location: [facility];  Service: Urology;  Laterality: N/A;  . CESAREAN SECTION     X3  . CHOLECYSTECTOMY    . ESOPHAGOGASTRODUODENOSCOPY ENDOSCOPY     "throat stretched" per pt.  Marland Kitchen LIVER BIOPSY     x4  . LUMBAR EPIDURAL INJECTION  01/2012  . TOTAL ABDOMINAL HYSTERECTOMY W/ BILATERAL SALPINGOOPHORECTOMY    . TUBAL LIGATION     Bilateral  . UPPER GASTROINTESTINAL ENDOSCOPY  09/13/2010   Social History   Socioeconomic History  . Marital status: Divorced    Spouse name: Not on file  . Number of children:  Not on file  . Years of education: GED  . Highest education level: Not on file  Occupational History    Employer: NOT EMPLOYED  Social Needs  . Financial resource strain: Not on file  . Food insecurity:    Worry: Not on file    Inability: Not on file  . Transportation needs:    Medical: Not on file    Non-medical: Not on file  Tobacco Use  . Smoking status: Former Smoker    Packs/day: 1.50    Years: 25.00    Pack years: 37.50    Types: Cigarettes    Last attempt to quit: 04/25/2013    Years since quitting: 5.3  . Smokeless tobacco: Never Used  . Tobacco comment: 15-20 cigs a day as of 11/12/2012  Substance and Sexual Activity  . Alcohol use: No    Alcohol/week: 0.0 standard drinks  . Drug use: No  . Sexual activity: Yes    Birth control/protection: Surgical  Lifestyle  . Physical activity:    Days per week: Not on file    Minutes per session: Not on file  . Stress: Not on file  Relationships  . Social connections:    Talks on phone: Not on file    Gets together: Not on file    Attends religious service: Not on file    Active member of club or organization: Not on file    Attends meetings of clubs or organizations: Not on file    Relationship status: Not on file  Other Topics Concern  . Not on file  Social History Narrative  . Not on file   Outpatient Encounter Medications as of 08/21/2018  Medication Sig  . ammonium lactate (AMLACTIN) 12 % cream   . amphetamine-dextroamphetamine (ADDERALL) 20 MG tablet Take 1 tablet (20 mg total) by mouth 2 (two) times daily.  . Ascorbic Acid (VITAMIN C) 1000 MG tablet Take 1,000 mg by mouth every morning.   Marland Kitchen aspirin 325 MG EC tablet Take 325 mg by mouth daily.  Marland Kitchen atorvastatin (LIPITOR) 80 MG tablet Take 1 tablet by mouth daily.  Marland Kitchen azaTHIOprine (IMURAN) 50 MG tablet TAKE 1 TABLET BY MOUTH ONCE DAILY  . calcium-vitamin D (OS-CAL 500 + D) 500-200 MG-UNIT per tablet Take 1 tablet by mouth 2 (two) times daily.   Marland Kitchen dicyclomine  (BENTYL) 10 MG capsule Take 1 capsule (10 mg total) by mouth daily as needed. For cramps  . DULoxetine (CYMBALTA) 60 MG capsule TAKE 1 CAPSULE(60 MG) BY MOUTH TWICE DAILY  . esomeprazole (NEXIUM) 40 MG capsule TAKE 1 CAPSULE BY MOUTH EVERY MORNING  . fexofenadine (ALLEGRA) 60 MG tablet Take 1 tablet by mouth 2 (two) times daily as needed.  . furosemide (LASIX) 40 MG tablet Take 40 mg by mouth every other day.   . gabapentin (NEURONTIN) 300 MG capsule Take 300 mg by mouth at bedtime as needed (for pain).   Marland Kitchen GARLIC PO Take 7,371 mg by mouth daily.   . hydrALAZINE (APRESOLINE) 50 MG tablet Take 1 tablet (50 mg total) by mouth every 8 (eight) hours.  . isosorbide mononitrate (IMDUR) 30 MG 24 hr tablet TAKE ONE (1) TABLET EACH DAY  . lisinopril (PRINIVIL,ZESTRIL) 40 MG tablet Take 40 mg by mouth daily.   Marland Kitchen loperamide (IMODIUM) 2 MG capsule Take 1 capsule (2 mg total) by mouth 4 (four) times daily as needed for diarrhea or loose stools.  . metFORMIN (GLUCOPHAGE) 500 MG tablet Take 500 mg by mouth 2 (two) times daily with a meal.   . metoprolol succinate (TOPROL-XL) 100 MG 24 hr tablet TAKE 1 TABLET BY MOUTH ONCE DAILY WITH FOOD  . Multiple Vitamins-Minerals (CENTRUM) tablet Take 1 tablet by mouth daily.  . ondansetron (ZOFRAN) 4 MG tablet Take 1 tablet (4 mg total) by mouth every 6 (six) hours.  . pramipexole (MIRAPEX) 0.25 MG tablet Take 0.25 mg by mouth at bedtime as needed. For restless legs  . prazosin (MINIPRESS) 2 MG capsule Take 1 capsule (2 mg total) by mouth at bedtime.  Marland Kitchen PROAIR HFA 108 (90 BASE) MCG/ACT inhaler Inhale 1-2 puffs into the lungs every 6 (six) hours as needed for wheezing or shortness of breath.   . Probiotic Product (RESTORA PO) Take by mouth daily.   . [DISCONTINUED] azithromycin (ZITHROMAX Z-PAK) 250 MG tablet Take by package instructions  . [DISCONTINUED] CIPRODEX otic suspension Place 2 drops into the left ear as needed. Reported on 10/04/2015   No facility-administered  encounter  medications on file as of 08/21/2018.     ALLERGIES: Allergies  Allergen Reactions  . Doxycycline Shortness Of Breath and Rash  . Fentanyl Shortness Of Breath, Itching and Other (See Comments)    Heart racing, SOB  . Ceftin [Cefuroxime Axetil]   . Iohexol Other (See Comments)    Knots on body   . Ivp Dye [Iodinated Diagnostic Agents] Other (See Comments)    Knots on body  . Norvasc [Amlodipine Besylate]   . Wellbutrin [Bupropion Hcl] Other (See Comments)    unknown  . Latex Rash  . Tape Rash    VACCINATION STATUS: Immunization History  Administered Date(s) Administered  . Influenza-Unspecified 03/20/2014    Diabetes  She presents for her initial diabetic visit. She has type 2 diabetes mellitus. Onset time: she was diagnosed at approx age of 67 yrs. Her disease course has been worsening. There are no hypoglycemic associated symptoms. Pertinent negatives for hypoglycemia include no headaches, seizures or tremors. Associated symptoms include blurred vision, polydipsia and polyuria. Pertinent negatives for diabetes include no chest pain. There are no hypoglycemic complications. Diabetic complications include a CVA. Risk factors for coronary artery disease include diabetes mellitus, dyslipidemia, hypertension, sedentary lifestyle, obesity, tobacco exposure and family history. Current diabetic treatment includes oral agent (monotherapy). Her weight is fluctuating minimally. She is following a generally unhealthy diet. When asked about meal planning, she reported none. She has not had a previous visit with a dietitian. She never participates in exercise. (She did not bring any meter nor log book with her, recent a1c was 9.9% on Aug 11, 2018.   ) An ACE inhibitor/angiotensin II receptor blocker is being taken.  Hyperlipidemia  This is a chronic problem. The current episode started more than 1 year ago. The problem is uncontrolled. Exacerbating diseases include diabetes,  hypothyroidism, liver disease and obesity. Pertinent negatives include no chest pain, myalgias or shortness of breath. Current antihyperlipidemic treatment includes statins. Risk factors for coronary artery disease include diabetes mellitus, dyslipidemia, hypertension, obesity, a sedentary lifestyle and family history.  Hypertension  This is a chronic problem. The problem is uncontrolled. Associated symptoms include blurred vision. Pertinent negatives include no chest pain, headaches, palpitations or shortness of breath. Past treatments include ACE inhibitors. Hypertensive end-organ damage includes CVA.      Review of Systems  Constitutional: Negative for chills and fever.  Eyes: Positive for blurred vision.  Respiratory: Negative for cough and shortness of breath.   Cardiovascular: Negative for chest pain and palpitations.       No Shortness of breath  Gastrointestinal: Negative for abdominal pain, diarrhea, nausea and vomiting.  Endocrine: Positive for polydipsia and polyuria.  Genitourinary: Negative for frequency, hematuria and urgency.  Musculoskeletal: Negative for myalgias.  Skin: Negative for rash.  Neurological: Negative for tremors, seizures and headaches.  Hematological: Does not bruise/bleed easily.  Psychiatric/Behavioral: Negative for hallucinations and suicidal ideas.    Objective:    BP (!) 151/95   Pulse 87   Ht 5' (1.524 m)   Wt 203 lb (92.1 kg)   BMI 39.65 kg/m   Wt Readings from Last 3 Encounters:  08/21/18 203 lb (92.1 kg)  08/10/18 205 lb 3.2 oz (93.1 kg)  11/01/17 206 lb 11.2 oz (93.8 kg)     Physical Exam Constitutional:      Appearance: She is well-developed.  HENT:     Head: Normocephalic and atraumatic.  Neck:     Musculoskeletal: Normal range of motion and neck supple.  Thyroid: No thyromegaly.     Trachea: No tracheal deviation.  Cardiovascular:     Rate and Rhythm: Normal rate and regular rhythm.  Pulmonary:     Effort: Pulmonary  effort is normal.     Breath sounds: Normal breath sounds.  Abdominal:     General: Abdomen is flat. Bowel sounds are normal.     Tenderness: There is no abdominal tenderness. There is no guarding.  Musculoskeletal: Normal range of motion.  Skin:    General: Skin is warm and dry.     Coloration: Skin is not pale.     Findings: No erythema or rash.  Neurological:     Mental Status: She is alert and oriented to person, place, and time.     Cranial Nerves: No cranial nerve deficit.     Coordination: Coordination normal.     Deep Tendon Reflexes: Reflexes are normal and symmetric.  Psychiatric:        Judgment: Judgment normal.       CMP ( most recent) CMP     Component Value Date/Time   NA 138 08/12/2018 0510   NA 142 11/01/2017 1055   K 4.1 08/12/2018 0510   CL 108 08/12/2018 0510   CO2 25 08/12/2018 0510   GLUCOSE 229 (H) 08/12/2018 0510   BUN 12 08/12/2018 0510   BUN 6 11/01/2017 1055   CREATININE 0.51 08/12/2018 0510   CREATININE 0.85 04/04/2016 1623   CALCIUM 8.2 (L) 08/12/2018 0510   PROT 7.5 08/10/2018 0500   PROT 6.7 11/01/2017 1055   ALBUMIN 3.8 08/10/2018 0500   ALBUMIN 3.9 11/01/2017 1055   AST 29 08/10/2018 0500   ALT 26 08/10/2018 0500   ALKPHOS 103 08/10/2018 0500   BILITOT 0.5 08/10/2018 0500   BILITOT 0.3 11/01/2017 1055   GFRNONAA >60 08/12/2018 0510   GFRAA >60 08/12/2018 0510     Diabetic Labs (most recent): Lab Results  Component Value Date   HGBA1C 9.9 (H) 08/11/2018   HGBA1C 6.3 (H) 03/06/2015     Lipid Panel ( most recent) Lipid Panel     Component Value Date/Time   CHOL 151 03/06/2015 0522   TRIG 79 03/06/2015 0522   TRIG 151 03/04/2008   HDL 31 (L) 03/06/2015 0522   CHOLHDL 4.9 03/06/2015 0522   VLDL 16 03/06/2015 0522   LDLCALC 104 (H) 03/06/2015 0522   LDLCALC 108t 03/04/2008      Lab Results  Component Value Date   TSH 3.381 01/29/2012           Assessment & Plan:   1. DM type 2 causing vascular disease  ([facility]CC  - Lindsay Walsh has currently uncontrolled symptomatic type 2 DM since  48 years of age,  with most recent A1c of 9.9 %. Recent labs reviewed. - I had a long discussion with her about the progressive nature of diabetes and the pathology behind its complications. -her diabetes is complicated by CVA, obesity/sedentary life and she remains at a high risk for more acute and chronic complications which include CAD, CVA, CKD, retinopathy, and neuropathy. These are all discussed in detail with her.  - I have counseled her on diet management and weight loss, by adopting a carbohydrate restricted/protein rich diet. - she admits that there is a room for improvement in her food and drink choices. - Suggestion is made for her to avoid simple carbohydrates  from her diet including Cakes, Sweet Desserts, Ice Cream, Soda (diet and regular), Sweet Tea, Candies,  Chips, Cookies, Store Bought Juices, Alcohol in Excess of  1-2 drinks a day, Artificial Sweeteners,  Coffee Creamer, and "Sugar-free" Products. This will help patient to have more stable blood glucose profile and potentially avoid unintended weight gain.  - I encouraged her to switch to  unprocessed or minimally processed complex starch and increased protein intake (animal or plant source), fruits, and vegetables.  - she is advised to stick to a routine mealtimes to eat 3 meals  a day and avoid unnecessary snacks ( to snack only to correct hypoglycemia).   - she will be scheduled with Jearld Fenton, RDN, CDE for individualized diabetes education.  - I have approached her with the following individualized plan to manage diabetes and patient agrees:   - she  may need insulin therapy in order for her to achieve control of her diabetes to target. -I preparation she is approached to initiate strict monitoring of glucose 4 times a day-before meals and at bedtime and return in 1 week with her meter and logs.  - she is encouraged to call clinic for  blood glucose levels less than 70 or above 300 mg /dl. - she is advised to continue metformin 500 mg po BID, therapeutically suitable for patient . - she is not a candidate for SGLT2 inhibitors, nor incretin therapy. - Patient specific target  A1c;  LDL, HDL, Triglycerides, and  Waist Circumference were discussed in detail.  2) Blood Pressure /Hypertension:  her blood pressure is not controlled to target.   she is advised to continue her current medications including Lisinopril 40  mg p.o. daily with breakfast . 3) Lipids/Hyperlipidemia:   Review of her recent lipid panel showed uncontrolled  LDL at 104 .  she  is advised to continue  Lipitor 80 mg daily at bedtime.  Side effects and precautions discussed with her.  4)  Weight/Diet:  Body mass index is 39.65 kg/m.  -   clearly complicating her diabetes care.  I discussed with her the fact that loss of 5 - 10% of her  current body weight will have the most impact on her diabetes management.  CDE Consult will be initiated . Exercise, and detailed carbohydrates information provided  -  detailed on discharge instructions.  5) Hypothyroidism: The etioology is not clear at this time. She is on minimal dose of Lt4 . No recent TFT to review. -she is advised to continue on same of dose of Levothyroxine 25 mcg po qam.  - We discussed about the correct intake of her thyroid hormone, on empty stomach at fasting, with water, separated by at least 30 minutes from breakfast and other medications,  and separated by more than 4 hours from calcium, iron, multivitamins, acid reflux medications (PPIs). -Patient is made aware of the fact that thyroid hormone replacement is needed for life, dose to be adjusted by periodic monitoring of thyroid function tests.   6) Chronic Care/Health Maintenance:  -she  Is on ACEI/ARB and Statin medications and  is encouraged to initiate and continue to follow up with Ophthalmology, Dentist,  Podiatrist at least yearly or according to  recommendations, and advised to  stay away from smoking. I have recommended yearly flu vaccine and pneumonia vaccine at least every 5 years;  She can not exercise optimally, and  sleep for at least 7 hours a day.  - she is  advised to maintain close follow up with Sinda Du, MD for primary care needs, as well as her other providers for optimal  and coordinated care.  - Time spent with the patient: 35 minutes, of which >50% was spent in obtaining information about her symptoms, reviewing her previous labs/studies, evaluations, and treatments, counseling her about her  Currently uncontrolled type 2 DM, Hypothyroidism, HPL, HTN , and developing plans for long term treatment based on the latest standards of care/guidelines.    [name] participated in the discussions, expressed understanding, and voiced agreement with the above plans.  All questions were answered to her satisfaction. she is encouraged to contact clinic should she have any questions or concerns prior to her return visit.  Follow up plan: - Return in about 1 week (around 08/28/2018) for Follow up with Meter and Logs Only - no Labs.  Glade Lloyd, MD [facility] [address] [place], [place] [address] Phone: [phone]  Fax: [phone]    08/21/2018, 6:46 PM  This note was partially dictated with voice recognition software. Similar sounding words can be transcribed inadequately or may not  be corrected upon review.

## 2018-08-21 NOTE — Patient Instructions (Signed)

## 2018-08-22 DIAGNOSIS — E039 Hypothyroidism, unspecified: Secondary | ICD-10-CM | POA: Insufficient documentation

## 2018-08-28 ENCOUNTER — Encounter: Payer: Self-pay | Admitting: "Endocrinology

## 2018-08-28 ENCOUNTER — Ambulatory Visit (INDEPENDENT_AMBULATORY_CARE_PROVIDER_SITE_OTHER): Payer: Medicare Other | Admitting: "Endocrinology

## 2018-08-28 VITALS — BP 145/82 | HR 61 | Ht 60.0 in | Wt 198.0 lb

## 2018-08-28 DIAGNOSIS — I10 Essential (primary) hypertension: Secondary | ICD-10-CM | POA: Diagnosis not present

## 2018-08-28 DIAGNOSIS — E1159 Type 2 diabetes mellitus with other circulatory complications: Secondary | ICD-10-CM

## 2018-08-28 DIAGNOSIS — E782 Mixed hyperlipidemia: Secondary | ICD-10-CM | POA: Diagnosis not present

## 2018-08-28 DIAGNOSIS — E039 Hypothyroidism, unspecified: Secondary | ICD-10-CM | POA: Diagnosis not present

## 2018-08-28 MED ORDER — METFORMIN HCL 1000 MG PO TABS: 1000.0000 mg | ORAL_TABLET | Freq: Two times a day (BID) | ORAL | 3 refills | Status: DC

## 2018-08-28 NOTE — Progress Notes (Signed)
Endocrinology follow-up  Note       08/28/2018, 1:50 PM   Subjective:    Patient ID: Lindsay Walsh, female    DOB: 12-Sep-1970.  Lindsay Walsh is being seen in follow-up  for management of currently uncontrolled symptomatic 2 diabetes, hypothyroidism, hyperlipidemia, hypertension. PMD:  Sinda Du, MD.   Past Medical History:  Diagnosis Date  . Angina    took SL nitro one week ago  . Arthritis    back  . Autoimmune hepatitis ([place])   . Chest pain    Normal stress echo in 2011; PVCs; pedal edema  . CVA (cerebral infarction)   . Depression   . Diabetes mellitus without complication ([place])   . Dyspareunia 05/06/2014  . Dysrhythmia    palpatations  . Fasting hyperglycemia   . Fibromyalgia   . Gastroesophageal reflux disease    Chronic abdominal pain; gastroparesis; globus hystericus; irritable bowel syndrome  . Hypertension   . Irritable bowel syndrome   . Narcotic dependence ([place])   . Overweight(278.02)   . PTSD (post-traumatic stress disorder)   . Pulmonary nodule   . Shortness of breath    with exertion  . Sleep apnea   . Sleep apnea    on CPAP machine  . Tobacco abuse    one pack per day; 35 pack years   Past Surgical History:  Procedure Laterality Date  . ABDOMINAL HYSTERECTOMY    . BACK SURGERY    . BLADDER SUSPENSION  09/12/2011   Procedure: TRANSVAGINAL TAPE (TVT) PROCEDURE;  Surgeon: [name], MD;  Location: [facility];  Service: Urology;  Laterality: N/A;  . CESAREAN SECTION     X3  . CHOLECYSTECTOMY    . ESOPHAGOGASTRODUODENOSCOPY ENDOSCOPY     "throat stretched" per pt.  Marland Kitchen LIVER BIOPSY     x4  . LUMBAR EPIDURAL INJECTION  01/2012  . TOTAL ABDOMINAL HYSTERECTOMY W/ BILATERAL SALPINGOOPHORECTOMY    . TUBAL LIGATION     Bilateral  . UPPER GASTROINTESTINAL ENDOSCOPY  09/13/2010   Social History   Socioeconomic History  . Marital status: Divorced    Spouse  name: Not on file  . Number of children: Not on file  . Years of education: GED  . Highest education level: Not on file  Occupational History    Employer: NOT EMPLOYED  Social Needs  . Financial resource strain: Not on file  . Food insecurity:    Worry: Not on file    Inability: Not on file  . Transportation needs:    Medical: Not on file    Non-medical: Not on file  Tobacco Use  . Smoking status: Former Smoker    Packs/day: 1.50    Years: 25.00    Pack years: 37.50    Types: Cigarettes    Last attempt to quit: 04/25/2013    Years since quitting: 5.3  . Smokeless tobacco: Never Used  . Tobacco comment: 15-20 cigs a day as of 11/12/2012  Substance and Sexual Activity  . Alcohol use: No    Alcohol/week: 0.0 standard drinks  . Drug use: No  . Sexual activity: Yes  Birth control/protection: Surgical  Lifestyle  . Physical activity:    Days per week: Not on file    Minutes per session: Not on file  . Stress: Not on file  Relationships  . Social connections:    Talks on phone: Not on file    Gets together: Not on file    Attends religious service: Not on file    Active member of club or organization: Not on file    Attends meetings of clubs or organizations: Not on file    Relationship status: Not on file  Other Topics Concern  . Not on file  Social History Narrative  . Not on file   Outpatient Encounter Medications as of 08/28/2018  Medication Sig  . ammonium lactate (AMLACTIN) 12 % cream   . amphetamine-dextroamphetamine (ADDERALL) 20 MG tablet Take 1 tablet (20 mg total) by mouth 2 (two) times daily.  . Ascorbic Acid (VITAMIN C) 1000 MG tablet Take 1,000 mg by mouth every morning.   Marland Kitchen aspirin 325 MG EC tablet Take 325 mg by mouth daily.  Marland Kitchen atorvastatin (LIPITOR) 80 MG tablet Take 1 tablet by mouth daily.  Marland Kitchen azaTHIOprine (IMURAN) 50 MG tablet TAKE 1 TABLET BY MOUTH ONCE DAILY  . calcium-vitamin D (OS-CAL 500 + D) 500-200 MG-UNIT per tablet Take 1 tablet by mouth 2  (two) times daily.   Marland Kitchen dicyclomine (BENTYL) 10 MG capsule Take 1 capsule (10 mg total) by mouth daily as needed. For cramps  . DULoxetine (CYMBALTA) 60 MG capsule TAKE 1 CAPSULE(60 MG) BY MOUTH TWICE DAILY  . esomeprazole (NEXIUM) 40 MG capsule TAKE 1 CAPSULE BY MOUTH EVERY MORNING  . fexofenadine (ALLEGRA) 60 MG tablet Take 1 tablet by mouth 2 (two) times daily as needed.  . furosemide (LASIX) 40 MG tablet Take 40 mg by mouth every other day.   . gabapentin (NEURONTIN) 300 MG capsule Take 300 mg by mouth at bedtime as needed (for pain).   Marland Kitchen GARLIC PO Take 4,010 mg by mouth daily.   . hydrALAZINE (APRESOLINE) 50 MG tablet Take 1 tablet (50 mg total) by mouth every 8 (eight) hours.  . isosorbide mononitrate (IMDUR) 30 MG 24 hr tablet TAKE ONE (1) TABLET EACH DAY  . levothyroxine (SYNTHROID, LEVOTHROID) 25 MCG tablet Take 25 mcg by mouth daily before breakfast.  . lisinopril (PRINIVIL,ZESTRIL) 40 MG tablet Take 40 mg by mouth daily.   Marland Kitchen loperamide (IMODIUM) 2 MG capsule Take 1 capsule (2 mg total) by mouth 4 (four) times daily as needed for diarrhea or loose stools.  . metFORMIN (GLUCOPHAGE) 1000 MG tablet Take 1 tablet (1,000 mg total) by mouth 2 (two) times daily with a meal.  . metoprolol succinate (TOPROL-XL) 100 MG 24 hr tablet TAKE 1 TABLET BY MOUTH ONCE DAILY WITH FOOD  . Multiple Vitamins-Minerals (CENTRUM) tablet Take 1 tablet by mouth daily.  . ondansetron (ZOFRAN) 4 MG tablet Take 1 tablet (4 mg total) by mouth every 6 (six) hours.  . pramipexole (MIRAPEX) 0.25 MG tablet Take 0.25 mg by mouth at bedtime as needed. For restless legs  . prazosin (MINIPRESS) 2 MG capsule Take 1 capsule (2 mg total) by mouth at bedtime.  Marland Kitchen PROAIR HFA 108 (90 BASE) MCG/ACT inhaler Inhale 1-2 puffs into the lungs every 6 (six) hours as needed for wheezing or shortness of breath.   . Probiotic Product (RESTORA PO) Take by mouth daily.   . [DISCONTINUED] metFORMIN (GLUCOPHAGE) 500 MG tablet Take 500 mg by mouth  2 (  two) times daily with a meal.    No facility-administered encounter medications on file as of 08/28/2018.     ALLERGIES: Allergies  Allergen Reactions  . Doxycycline Shortness Of Breath and Rash  . Fentanyl Shortness Of Breath, Itching and Other (See Comments)    Heart racing, SOB  . Ceftin [Cefuroxime Axetil]   . Iohexol Other (See Comments)    Knots on body   . Ivp Dye [Iodinated Diagnostic Agents] Other (See Comments)    Knots on body  . Norvasc [Amlodipine Besylate]   . Wellbutrin [Bupropion Hcl] Other (See Comments)    unknown  . Latex Rash  . Tape Rash    VACCINATION STATUS: Immunization History  Administered Date(s) Administered  . Influenza-Unspecified 03/20/2014    Diabetes  She presents for her follow-up diabetic visit. She has type 2 diabetes mellitus. Onset time: she was diagnosed at approx age of 8 yrs. Her disease course has been improving. There are no hypoglycemic associated symptoms. Pertinent negatives for hypoglycemia include no headaches, seizures or tremors. Associated symptoms include blurred vision. Pertinent negatives for diabetes include no chest pain, no polydipsia and no polyuria. There are no hypoglycemic complications. Symptoms are improving. Diabetic complications include a CVA. Risk factors for coronary artery disease include diabetes mellitus, dyslipidemia, hypertension, sedentary lifestyle, obesity, tobacco exposure and family history. Current diabetic treatment includes oral agent (monotherapy). Her weight is decreasing steadily. She is following a generally unhealthy diet. When asked about meal planning, she reported none. She has not had a previous visit with a dietitian. She never participates in exercise. Her home blood glucose trend is decreasing steadily. Her breakfast blood glucose range is generally 140-180 mg/dl. Her lunch blood glucose range is generally 140-180 mg/dl. Her dinner blood glucose range is generally 140-180 mg/dl. Her bedtime  blood glucose range is generally 140-180 mg/dl. Her overall blood glucose range is 140-180 mg/dl. An ACE inhibitor/angiotensin II receptor blocker is being taken.  Hyperlipidemia  This is a chronic problem. The current episode started more than 1 year ago. The problem is uncontrolled. Exacerbating diseases include diabetes, hypothyroidism, liver disease and obesity. Pertinent negatives include no chest pain, myalgias or shortness of breath. Current antihyperlipidemic treatment includes statins. Risk factors for coronary artery disease include diabetes mellitus, dyslipidemia, hypertension, obesity, a sedentary lifestyle and family history.  Hypertension  This is a chronic problem. The problem is uncontrolled. Associated symptoms include blurred vision. Pertinent negatives include no chest pain, headaches, palpitations or shortness of breath. Past treatments include ACE inhibitors. Hypertensive end-organ damage includes CVA.      Review of Systems  Constitutional: Negative for chills and fever.  Eyes: Positive for blurred vision.  Respiratory: Negative for cough and shortness of breath.   Cardiovascular: Negative for chest pain and palpitations.       No Shortness of breath  Gastrointestinal: Negative for abdominal pain, diarrhea, nausea and vomiting.  Endocrine: Negative for polydipsia and polyuria.  Genitourinary: Negative for frequency, hematuria and urgency.  Musculoskeletal: Negative for myalgias.  Skin: Negative for rash.  Neurological: Negative for tremors, seizures and headaches.  Hematological: Does not bruise/bleed easily.  Psychiatric/Behavioral: Negative for hallucinations and suicidal ideas.    Objective:    BP (!) 145/82   Pulse 61   Ht 5' (1.524 m)   Wt 198 lb (89.8 kg)   BMI 38.67 kg/m   Wt Readings from Last 3 Encounters:  08/28/18 198 lb (89.8 kg)  08/21/18 203 lb (92.1 kg)  08/10/18 205 lb 3.2 oz (  93.1 kg)     Physical Exam Constitutional:      Appearance:  She is well-developed.  HENT:     Head: Normocephalic and atraumatic.  Neck:     Musculoskeletal: Normal range of motion and neck supple.     Thyroid: No thyromegaly.     Trachea: No tracheal deviation.  Cardiovascular:     Rate and Rhythm: Normal rate and regular rhythm.  Pulmonary:     Effort: Pulmonary effort is normal.     Breath sounds: Normal breath sounds.  Abdominal:     General: Abdomen is flat. Bowel sounds are normal.     Tenderness: There is no abdominal tenderness. There is no guarding.  Musculoskeletal: Normal range of motion.  Skin:    General: Skin is warm and dry.     Coloration: Skin is not pale.     Findings: No erythema or rash.  Neurological:     Mental Status: She is alert and oriented to person, place, and time.     Cranial Nerves: No cranial nerve deficit.     Coordination: Coordination normal.     Deep Tendon Reflexes: Reflexes are normal and symmetric.  Psychiatric:        Judgment: Judgment normal.     CMP     Component Value Date/Time   NA 138 08/12/2018 0510   NA 142 11/01/2017 1055   K 4.1 08/12/2018 0510   CL 108 08/12/2018 0510   CO2 25 08/12/2018 0510   GLUCOSE 229 (H) 08/12/2018 0510   BUN 12 08/12/2018 0510   BUN 6 11/01/2017 1055   CREATININE 0.51 08/12/2018 0510   CREATININE 0.85 04/04/2016 1623   CALCIUM 8.2 (L) 08/12/2018 0510   PROT 7.5 08/10/2018 0500   PROT 6.7 11/01/2017 1055   ALBUMIN 3.8 08/10/2018 0500   ALBUMIN 3.9 11/01/2017 1055   AST 29 08/10/2018 0500   ALT 26 08/10/2018 0500   ALKPHOS 103 08/10/2018 0500   BILITOT 0.5 08/10/2018 0500   BILITOT 0.3 11/01/2017 1055   GFRNONAA >60 08/12/2018 0510   GFRAA >60 08/12/2018 0510     Diabetic Labs (most recent): Lab Results  Component Value Date   HGBA1C 9.9 (H) 08/11/2018   HGBA1C 6.3 (H) 03/06/2015     Lipid Panel ( most recent) Lipid Panel     Component Value Date/Time   CHOL 151 03/06/2015 0522   TRIG 79 03/06/2015 0522   TRIG 151 03/04/2008   HDL  31 (L) 03/06/2015 0522   CHOLHDL 4.9 03/06/2015 0522   VLDL 16 03/06/2015 0522   LDLCALC 104 (H) 03/06/2015 0522   LDLCALC 108t 03/04/2008      Lab Results  Component Value Date   TSH 3.381 01/29/2012      Assessment & Plan:   1. DM type 2 causing vascular disease ([facility]  - Lindsay Walsh has currently uncontrolled symptomatic type 2 DM since  48 years of age,  with most recent A1c of 9.9 %. Recent labs reviewed. -She returns with near target glycemic profile after adjustment in her diet and metformin therapy.    -her diabetes is complicated by CVA, obesity/sedentary life and she remains at a high risk for more acute and chronic complications which include CAD, CVA, CKD, retinopathy, and neuropathy. These are all discussed in detail with her.  - I have counseled her on diet management and weight loss, by adopting a carbohydrate restricted/protein rich diet.  - Patient admits there is a room for improvement  in her diet and drink choices. -  Suggestion is made for her to avoid simple carbohydrates  from her diet including Cakes, Sweet Desserts / Pastries, Ice Cream, Soda (diet and regular), Sweet Tea, Candies, Chips, Cookies, Store Bought Juices, Alcohol in Excess of  1-2 drinks a day, Artificial Sweeteners, and "Sugar-free" Products. This will help patient to have stable blood glucose profile and potentially avoid unintended weight gain.  - I encouraged her to switch to  unprocessed or minimally processed complex starch and increased protein intake (animal or plant source), fruits, and vegetables.  - she is advised to stick to a routine mealtimes to eat 3 meals  a day and avoid unnecessary snacks ( to snack only to correct hypoglycemia).   - she has been  scheduled with Jearld Fenton, RDN, CDE for individualized diabetes education.  - I have approached her with the following individualized plan to manage diabetes and patient agrees:   -Based on her presentation with near target  glycemic profile, she will not require insulin treatment for now.   -She is advised to increase her metformin to 1000 mg p.o. twice daily, therapeutically suitable for her.  - she is not a candidate for SGLT2 inhibitors, nor incretin therapy. - Patient specific target  A1c;  LDL, HDL, Triglycerides, and  Waist Circumference were discussed in detail.  2) Blood Pressure /Hypertension: Her blood pressure is not controlled to target.  she is advised to continue her current medications including Lisinopril 40  mg p.o. daily with breakfast . 3) Lipids/Hyperlipidemia:   Review of her recent lipid panel showed uncontrolled  LDL at 104 .  she  is advised to continue Lipitor  80 mg daily at bedtime.  Side effects and precautions discussed with her.  4)  Weight/Diet:  Body mass index is 38.67 kg/m.  -   clearly complicating her diabetes care.  I discussed with her the fact that loss of 5 - 10% of her  current body weight will have the most impact on her diabetes management.  CDE Consult will be initiated . Exercise, and detailed carbohydrates information provided  -  detailed on discharge instructions.  5) Hypothyroidism: The etioology is not clear at this time. She is on minimal dose of Lt4 . No recent TFT to review. -she is advised to continue on her current dose of levothyroxine  25 mcg po qam.  - We discussed about the correct intake of her thyroid hormone, on empty stomach at fasting, with water, separated by at least 30 minutes from breakfast and other medications,  and separated by more than 4 hours from calcium, iron, multivitamins, acid reflux medications (PPIs). -Patient is made aware of the fact that thyroid hormone replacement is needed for life, dose to be adjusted by periodic monitoring of thyroid function tests.   6) Chronic Care/Health Maintenance:  -she  Is on ACEI/ARB and Statin medications and  is encouraged to initiate and continue to follow up with Ophthalmology, Dentist,  Podiatrist  at least yearly or according to recommendations, and advised to  stay away from smoking. I have recommended yearly flu vaccine and pneumonia vaccine at least every 5 years;  She can not exercise optimally, and  sleep for at least 7 hours a day.  - she is  advised to maintain close follow up with Sinda Du, MD for primary care needs, as well as her other providers for optimal and coordinated care.  - Time spent with the patient: 25 min, of which >50%  was spent in reviewing her blood glucose logs , discussing her hypoglycemia and hyperglycemia episodes, reviewing her current and  previous labs / studies and medications  doses and developing a plan to avoid hypoglycemia and hyperglycemia. Please refer to Patient Instructions for Blood Glucose Monitoring and Insulin/Medications Dosing Guide"  in media tab for additional information. [name] participated in the discussions, expressed understanding, and voiced agreement with the above plans.  All questions were answered to her satisfaction. she is encouraged to contact clinic should she have any questions or concerns prior to her return visit.   Follow up plan: - Return in about 3 months (around 11/26/2018) for Follow up with Pre-visit Labs.  Glade Lloyd, MD [facility] [address] [place], [place] [address] Phone: [phone]  Fax: [phone]    08/28/2018, 1:50 PM  This note was partially dictated with voice recognition software. Similar sounding words can be transcribed inadequately or may not  be corrected upon review.

## 2018-08-28 NOTE — Patient Instructions (Signed)

## 2018-09-08 ENCOUNTER — Other Ambulatory Visit: Payer: Self-pay | Admitting: Cardiovascular Disease

## 2018-09-08 ENCOUNTER — Other Ambulatory Visit (INDEPENDENT_AMBULATORY_CARE_PROVIDER_SITE_OTHER): Payer: Self-pay | Admitting: Internal Medicine

## 2018-09-08 ENCOUNTER — Other Ambulatory Visit (HOSPITAL_COMMUNITY): Payer: Self-pay | Admitting: Psychiatry

## 2018-09-08 DIAGNOSIS — F515 Nightmare disorder: Principal | ICD-10-CM

## 2018-09-08 DIAGNOSIS — F4312 Post-traumatic stress disorder, chronic: Secondary | ICD-10-CM

## 2018-09-08 DIAGNOSIS — F4310 Post-traumatic stress disorder, unspecified: Secondary | ICD-10-CM

## 2018-09-10 MED ORDER — METOPROLOL SUCCINATE ER 100 MG PO TB24: ORAL_TABLET | ORAL | 0 refills | Status: DC

## 2018-09-10 NOTE — Addendum Note (Signed)
Addended by: De Burrs on: 09/10/2018 04:56 PM   Modules accepted: Orders

## 2018-09-11 DIAGNOSIS — J449 Chronic obstructive pulmonary disease, unspecified: Secondary | ICD-10-CM | POA: Diagnosis not present

## 2018-09-25 ENCOUNTER — Telehealth: Payer: Self-pay | Admitting: Nutrition

## 2018-09-25 NOTE — Telephone Encounter (Signed)
Tried x  2 to call and reschedule appt for phone followup . No VM set up.

## 2018-09-30 ENCOUNTER — Ambulatory Visit: Payer: Self-pay | Admitting: Nutrition

## 2018-10-10 ENCOUNTER — Other Ambulatory Visit: Payer: Self-pay

## 2018-10-10 ENCOUNTER — Encounter: Payer: Medicare Other | Attending: Pulmonary Disease | Admitting: Nutrition

## 2018-10-10 VITALS — Wt 189.0 lb

## 2018-10-10 DIAGNOSIS — E118 Type 2 diabetes mellitus with unspecified complications: Secondary | ICD-10-CM | POA: Insufficient documentation

## 2018-10-10 DIAGNOSIS — E669 Obesity, unspecified: Secondary | ICD-10-CM | POA: Insufficient documentation

## 2018-10-10 DIAGNOSIS — E1159 Type 2 diabetes mellitus with other circulatory complications: Secondary | ICD-10-CM | POA: Diagnosis not present

## 2018-10-10 NOTE — Progress Notes (Signed)
Telephone visit Medical Nutrition Therapy:  Appt start time: 1500 end time:  1600.  Assessment:  Primary concerns today: Diabetes Type 2 Dm, Obesity. Autoimmune Hepatitis. Been had DM for 1 yr. Sees Dr. Luan Pulling as PCP.  Eats 3 meals per day now but had been eating only 1 meal per day. Sees Dr. Dorris Fetch, Endocrinology. Had not met with an RD/CDE before. Trying to eat things more baked ab broiled and cutting out fried foods. Wants to lose weight. FBS  180-190's  Bedtime 170-180's Cut out sodas, sweets. Eating more fresh vegetables. Drinking water only and unsweet tea. Making slow and stead progress. Not exercising but willing to start walking. Diet still needs more high fiber foods and fresh fruits and vegetables.   Lab Results  Component Value Date   HGBA1C 9.9 (H) 08/11/2018   .  Vitals with BMI 08/28/2018  Height 5' 0"   Weight 198 lbs  BMI 87.68  Systolic 115  Diastolic 82  Pulse 61  Respirations    CMP Latest Ref Rng & Units 08/12/2018 08/11/2018 08/10/2018  Glucose 70 - 99 mg/dL 229(H) 183(H) 288(H)  BUN 6 - 20 mg/dL 12 7 7   Creatinine 0.44 - 1.00 mg/dL 0.51 0.57 0.64  Sodium 135 - 145 mmol/L 138 136 135  Potassium 3.5 - 5.1 mmol/L 4.1 3.9 3.6  Chloride 98 - 111 mmol/L 108 108 100  CO2 22 - 32 mmol/L 25 22 26   Calcium 8.9 - 10.3 mg/dL 8.2(L) 8.0(L) 8.8(L)  Total Protein 6.5 - 8.1 g/dL - - 7.5  Total Bilirubin 0.3 - 1.2 mg/dL - - 0.5  Alkaline Phos 38 - 126 U/L - - 103  AST 15 - 41 U/L - - 29  ALT 0 - 44 U/L - - 26     Preferred Learning Style    No preference indicated   Learning Readines  Ready  Change in progress   MEDICATIONS:    DIETARY INTAKE:   24-hr recall:  B ( AM): Strawberry oatmeal,   Snk ( AM): L ( PM):  Chicken uggets- airfried; 6,  Snk ( PM): D ( PM): BBQ Meatballs, green beans, corn on cob, water, unsweet tea Snk ( PM): pastatios Beverages: water and unsweet tea  Usual physical activity: walk some  Estimated energy needs: 1200  calories 135 g carbohydrates 90 g protein 33 g fat  Progress Towards Goal(s):  In progress.   Nutritional Diagnosis:  NB-1.1 Food and nutrition-related knowledge deficit As related to Diabetes Type 2,.  As evidenced by A1C 9.9  and BMI 38.    Intervention:  Nutrition and Diabetes education provided on My Plate, CHO counting, meal planning, portion sizes, timing of meals, avoiding snacks between meals unless having a low blood sugar, target ranges for A1C and blood sugars, signs/symptoms and treatment of hyper/hypoglycemia, monitoring blood sugars, taking medications as prescribed, benefits of exercising 30 minutes per day and prevention of complications of DM.  Goals Eat three balanced meals Increase fresh fruits and vegetables. Avoid snacks Walk 15-30 minutes daily. Get A1C 7%. FBS Less than 130,   Bedtime less than 150 mg/dl. Keep drinking water 5 bottles of water per day  Teaching Method Utilized:  Visual Auditory Hands on  Handouts given during visit include:  Reviewed the plate method  Barriers to learning/adherence to lifestyle change: None  Demonstrated degree of understanding via:  Teach Back   Monitoring/Evaluation:  Dietary intake, exercise, , and body weight in 1 month(s) .

## 2018-10-10 NOTE — Patient Instructions (Signed)
Goals Eat three balanced meals Increase fresh fruits and vegetables. Avoid snacks Walk 15-30 minutes daily. Get A1C 7%. FBS Less than 130,   Bedtime less than 150 m. Keep drinking water 5 bottles of water per day

## 2018-10-12 DIAGNOSIS — J449 Chronic obstructive pulmonary disease, unspecified: Secondary | ICD-10-CM | POA: Diagnosis not present

## 2018-10-18 ENCOUNTER — Ambulatory Visit (HOSPITAL_COMMUNITY): Payer: Medicare Other | Admitting: Psychiatry

## 2018-10-21 DIAGNOSIS — Z0000 Encounter for general adult medical examination without abnormal findings: Secondary | ICD-10-CM | POA: Diagnosis not present

## 2018-10-29 ENCOUNTER — Other Ambulatory Visit (HOSPITAL_COMMUNITY): Payer: Self-pay | Admitting: Psychiatry

## 2018-10-29 ENCOUNTER — Encounter: Payer: Self-pay | Admitting: Nutrition

## 2018-10-29 ENCOUNTER — Telehealth (HOSPITAL_COMMUNITY): Payer: Self-pay | Admitting: *Deleted

## 2018-10-29 DIAGNOSIS — F900 Attention-deficit hyperactivity disorder, predominantly inattentive type: Secondary | ICD-10-CM

## 2018-10-29 MED ORDER — AMPHETAMINE-DEXTROAMPHETAMINE 20 MG PO TABS: 20.0000 mg | ORAL_TABLET | Freq: Two times a day (BID) | ORAL | 0 refills | Status: DC

## 2018-10-29 NOTE — Telephone Encounter (Signed)
Dr Harrington Challenger Patient was rescheduled due to provider's schedule &  She's requesting re fill on Adderall Next Visit 11-14-2018

## 2018-10-29 NOTE — Telephone Encounter (Signed)
done

## 2018-11-11 DIAGNOSIS — J449 Chronic obstructive pulmonary disease, unspecified: Secondary | ICD-10-CM | POA: Diagnosis not present

## 2018-11-14 ENCOUNTER — Encounter (HOSPITAL_COMMUNITY): Payer: Self-pay | Admitting: Psychiatry

## 2018-11-14 ENCOUNTER — Ambulatory Visit (INDEPENDENT_AMBULATORY_CARE_PROVIDER_SITE_OTHER): Payer: Medicare Other | Admitting: Psychiatry

## 2018-11-14 ENCOUNTER — Other Ambulatory Visit: Payer: Self-pay

## 2018-11-14 DIAGNOSIS — F4310 Post-traumatic stress disorder, unspecified: Secondary | ICD-10-CM | POA: Diagnosis not present

## 2018-11-14 DIAGNOSIS — F900 Attention-deficit hyperactivity disorder, predominantly inattentive type: Secondary | ICD-10-CM | POA: Diagnosis not present

## 2018-11-14 DIAGNOSIS — F515 Nightmare disorder: Secondary | ICD-10-CM | POA: Diagnosis not present

## 2018-11-14 DIAGNOSIS — F4312 Post-traumatic stress disorder, chronic: Secondary | ICD-10-CM

## 2018-11-14 MED ORDER — AMPHETAMINE-DEXTROAMPHETAMINE 20 MG PO TABS: 20.0000 mg | ORAL_TABLET | Freq: Two times a day (BID) | ORAL | 0 refills | Status: DC

## 2018-11-14 MED ORDER — PRAZOSIN HCL 2 MG PO CAPS: ORAL_CAPSULE | ORAL | 2 refills | Status: DC

## 2018-11-14 MED ORDER — DULOXETINE HCL 60 MG PO CPEP: ORAL_CAPSULE | ORAL | 2 refills | Status: DC
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
## 2018-11-26 ENCOUNTER — Ambulatory Visit: Payer: Self-pay | Admitting: Nutrition

## 2018-11-26 ENCOUNTER — Telehealth: Payer: Self-pay | Admitting: Nutrition

## 2018-11-26 ENCOUNTER — Ambulatory Visit: Payer: Medicare Other | Admitting: "Endocrinology

## 2018-11-26 NOTE — Telephone Encounter (Signed)
TC for telephone follow up. She wasn't home. Left message for her to call for f/u visit.

## 2018-12-12 DIAGNOSIS — J449 Chronic obstructive pulmonary disease, unspecified: Secondary | ICD-10-CM | POA: Diagnosis not present

## 2018-12-30 DIAGNOSIS — I10 Essential (primary) hypertension: Secondary | ICD-10-CM | POA: Diagnosis not present

## 2018-12-30 DIAGNOSIS — G4733 Obstructive sleep apnea (adult) (pediatric): Secondary | ICD-10-CM | POA: Diagnosis not present

## 2018-12-30 DIAGNOSIS — K754 Autoimmune hepatitis: Secondary | ICD-10-CM | POA: Diagnosis not present

## 2018-12-30 DIAGNOSIS — E119 Type 2 diabetes mellitus without complications: Secondary | ICD-10-CM | POA: Diagnosis not present

## 2019-01-09 DIAGNOSIS — G4730 Sleep apnea, unspecified: Secondary | ICD-10-CM | POA: Diagnosis not present

## 2019-01-09 DIAGNOSIS — I10 Essential (primary) hypertension: Secondary | ICD-10-CM | POA: Diagnosis not present

## 2019-01-09 DIAGNOSIS — G4733 Obstructive sleep apnea (adult) (pediatric): Secondary | ICD-10-CM | POA: Diagnosis not present

## 2019-01-09 DIAGNOSIS — K754 Autoimmune hepatitis: Secondary | ICD-10-CM | POA: Diagnosis not present

## 2019-01-09 DIAGNOSIS — E119 Type 2 diabetes mellitus without complications: Secondary | ICD-10-CM | POA: Diagnosis not present

## 2019-01-09 DIAGNOSIS — M797 Fibromyalgia: Secondary | ICD-10-CM | POA: Diagnosis not present

## 2019-01-09 DIAGNOSIS — G2581 Restless legs syndrome: Secondary | ICD-10-CM | POA: Diagnosis not present

## 2019-01-09 LAB — VITAMIN D 25 HYDROXY (VIT D DEFICIENCY, FRACTURES): Vit D, 25-Hydroxy: 25.4

## 2019-01-11 DIAGNOSIS — J449 Chronic obstructive pulmonary disease, unspecified: Secondary | ICD-10-CM | POA: Diagnosis not present

## 2019-01-14 ENCOUNTER — Other Ambulatory Visit: Payer: Self-pay | Admitting: Cardiovascular Disease

## 2019-02-02 ENCOUNTER — Other Ambulatory Visit: Payer: Self-pay | Admitting: Cardiovascular Disease

## 2019-02-11 DIAGNOSIS — J449 Chronic obstructive pulmonary disease, unspecified: Secondary | ICD-10-CM | POA: Diagnosis not present

## 2019-03-14 DIAGNOSIS — J449 Chronic obstructive pulmonary disease, unspecified: Secondary | ICD-10-CM | POA: Diagnosis not present

## 2019-03-24 ENCOUNTER — Other Ambulatory Visit: Payer: Self-pay | Admitting: *Deleted

## 2019-03-24 DIAGNOSIS — Z20822 Contact with and (suspected) exposure to covid-19: Secondary | ICD-10-CM

## 2019-03-24 DIAGNOSIS — R6889 Other general symptoms and signs: Secondary | ICD-10-CM | POA: Diagnosis not present

## 2019-03-25 LAB — NOVEL CORONAVIRUS, NAA: SARS-CoV-2, NAA: NOT DETECTED

## 2019-03-31 ENCOUNTER — Telehealth (HOSPITAL_COMMUNITY): Payer: Self-pay | Admitting: *Deleted

## 2019-03-31 ENCOUNTER — Other Ambulatory Visit (HOSPITAL_COMMUNITY): Payer: Self-pay | Admitting: Psychiatry

## 2019-03-31 DIAGNOSIS — F900 Attention-deficit hyperactivity disorder, predominantly inattentive type: Secondary | ICD-10-CM

## 2019-03-31 MED ORDER — AMPHETAMINE-DEXTROAMPHETAMINE 20 MG PO TABS: 20.0000 mg | ORAL_TABLET | Freq: Two times a day (BID) | ORAL | 0 refills | Status: DC

## 2019-03-31 NOTE — Telephone Encounter (Signed)
sent 

## 2019-03-31 NOTE — Telephone Encounter (Signed)
Patient called requesting Refill on her Adderall. She  Forgot to make a f/u appt & now she will be out of her Adderall.  Next appt is on  04/11/2019

## 2019-04-05 ENCOUNTER — Emergency Department (HOSPITAL_COMMUNITY): Payer: Medicare Other

## 2019-04-05 ENCOUNTER — Encounter (HOSPITAL_COMMUNITY): Payer: Self-pay | Admitting: Emergency Medicine

## 2019-04-05 ENCOUNTER — Emergency Department (HOSPITAL_COMMUNITY)
Admission: EM | Admit: 2019-04-05 | Discharge: 2019-04-05 | Disposition: A | Discharge status: Home / Self Care | Payer: Medicare Other | Attending: Emergency Medicine | Admitting: Emergency Medicine

## 2019-04-05 ENCOUNTER — Other Ambulatory Visit: Payer: Self-pay

## 2019-04-05 DIAGNOSIS — Z79899 Other long term (current) drug therapy: Secondary | ICD-10-CM | POA: Insufficient documentation

## 2019-04-05 DIAGNOSIS — R112 Nausea with vomiting, unspecified: Secondary | ICD-10-CM | POA: Diagnosis not present

## 2019-04-05 DIAGNOSIS — R197 Diarrhea, unspecified: Secondary | ICD-10-CM | POA: Diagnosis not present

## 2019-04-05 DIAGNOSIS — E039 Hypothyroidism, unspecified: Secondary | ICD-10-CM | POA: Insufficient documentation

## 2019-04-05 DIAGNOSIS — R110 Nausea: Secondary | ICD-10-CM | POA: Diagnosis present

## 2019-04-05 DIAGNOSIS — I10 Essential (primary) hypertension: Secondary | ICD-10-CM | POA: Insufficient documentation

## 2019-04-05 DIAGNOSIS — Z7982 Long term (current) use of aspirin: Secondary | ICD-10-CM | POA: Insufficient documentation

## 2019-04-05 DIAGNOSIS — R0689 Other abnormalities of breathing: Secondary | ICD-10-CM | POA: Diagnosis not present

## 2019-04-05 DIAGNOSIS — E119 Type 2 diabetes mellitus without complications: Secondary | ICD-10-CM | POA: Insufficient documentation

## 2019-04-05 DIAGNOSIS — Z87891 Personal history of nicotine dependence: Secondary | ICD-10-CM | POA: Diagnosis not present

## 2019-04-05 DIAGNOSIS — R079 Chest pain, unspecified: Secondary | ICD-10-CM | POA: Diagnosis not present

## 2019-04-05 DIAGNOSIS — Z91040 Latex allergy status: Secondary | ICD-10-CM | POA: Insufficient documentation

## 2019-04-05 DIAGNOSIS — Z8673 Personal history of transient ischemic attack (TIA), and cerebral infarction without residual deficits: Secondary | ICD-10-CM | POA: Insufficient documentation

## 2019-04-05 DIAGNOSIS — Z7984 Long term (current) use of oral hypoglycemic drugs: Secondary | ICD-10-CM | POA: Diagnosis not present

## 2019-04-05 DIAGNOSIS — R072 Precordial pain: Secondary | ICD-10-CM | POA: Diagnosis not present

## 2019-04-05 DIAGNOSIS — R531 Weakness: Secondary | ICD-10-CM | POA: Diagnosis not present

## 2019-04-05 DIAGNOSIS — R1111 Vomiting without nausea: Secondary | ICD-10-CM | POA: Diagnosis not present

## 2019-04-05 LAB — CBC WITH DIFFERENTIAL/PLATELET
Abs Immature Granulocytes: 0.07 10*3/uL (ref 0.00–0.07)
Basophils Absolute: 0.1 10*3/uL (ref 0.0–0.1)
Basophils Relative: 0 %
Eosinophils Absolute: 0.1 10*3/uL (ref 0.0–0.5)
Eosinophils Relative: 0 %
HCT: 44.9 % (ref 36.0–46.0)
Hemoglobin: 14.6 g/dL (ref 12.0–15.0)
Immature Granulocytes: 0 %
Lymphocytes Relative: 2 %
Lymphs Abs: 0.5 10*3/uL — ABNORMAL LOW (ref 0.7–4.0)
MCH: 29.9 pg (ref 26.0–34.0)
MCHC: 32.5 g/dL (ref 30.0–36.0)
MCV: 92 fL (ref 80.0–100.0)
Monocytes Absolute: 1.8 10*3/uL — ABNORMAL HIGH (ref 0.1–1.0)
Monocytes Relative: 8 %
Neutro Abs: 19.3 10*3/uL — ABNORMAL HIGH (ref 1.7–7.7)
Neutrophils Relative %: 90 %
Platelets: 312 10*3/uL (ref 150–400)
RBC: 4.88 MIL/uL (ref 3.87–5.11)
RDW: 12.2 % (ref 11.5–15.5)
WBC: 21.8 10*3/uL — ABNORMAL HIGH (ref 4.0–10.5)
nRBC: 0 % (ref 0.0–0.2)

## 2019-04-05 LAB — URINALYSIS, ROUTINE W REFLEX MICROSCOPIC
Glucose, UA: NEGATIVE mg/dL
Ketones, ur: NEGATIVE mg/dL
Nitrite: NEGATIVE
Protein, ur: 100 mg/dL — AB
Specific Gravity, Urine: 1.03 (ref 1.005–1.030)
WBC, UA: >50 WBC/hpf — ABNORMAL HIGH (ref 0–5)
pH: 5 (ref 5.0–8.0)

## 2019-04-05 LAB — TROPONIN I (HIGH SENSITIVITY)
Troponin I (High Sensitivity): 6 ng/L (ref <18)
Troponin I (High Sensitivity): 8 ng/L (ref <18)

## 2019-04-05 LAB — COMPREHENSIVE METABOLIC PANEL
ALT: 25 U/L (ref 0–44)
AST: 23 U/L (ref 15–41)
Albumin: 4.2 g/dL (ref 3.5–5.0)
Alkaline Phosphatase: 131 U/L — ABNORMAL HIGH (ref 38–126)
Anion gap: 10 (ref 5–15)
BUN: 17 mg/dL (ref 6–20)
CO2: 25 mmol/L (ref 22–32)
Calcium: 9.1 mg/dL (ref 8.9–10.3)
Chloride: 104 mmol/L (ref 98–111)
Creatinine, Ser: 0.61 mg/dL (ref 0.44–1.00)
GFR calc Af Amer: >60 mL/min (ref >60)
GFR calc non Af Amer: >60 mL/min (ref >60)
Glucose, Bld: 214 mg/dL — ABNORMAL HIGH (ref 70–99)
Potassium: 4.1 mmol/L (ref 3.5–5.1)
Sodium: 139 mmol/L (ref 135–145)
Total Bilirubin: 0.9 mg/dL (ref 0.3–1.2)
Total Protein: 8.2 g/dL — ABNORMAL HIGH (ref 6.5–8.1)

## 2019-04-05 LAB — LIPASE, BLOOD: Lipase: 31 U/L (ref 11–51)

## 2019-04-05 MED ORDER — CIPROFLOXACIN HCL 500 MG PO TABS: 500.0000 mg | ORAL_TABLET | Freq: Two times a day (BID) | ORAL | 0 refills | Status: DC

## 2019-04-05 MED ORDER — PROMETHAZINE HCL 25 MG PO TABS: 25.0000 mg | ORAL_TABLET | Freq: Four times a day (QID) | ORAL | 0 refills | Status: DC | PRN

## 2019-04-05 MED ORDER — SODIUM CHLORIDE 0.9 % IV BOLUS
1000.0000 mL | Freq: Once | INTRAVENOUS | Status: AC
  Administered 2019-04-05: 1000 mL via INTRAVENOUS

## 2019-04-05 NOTE — ED Provider Notes (Signed)
[facility] EMERGENCY DEPARTMENT Provider Note   CSN: [id number] Arrival date & time: 04/05/19  1258     History   Chief Complaint Chief Complaint  Patient presents with  . Nausea  . Chest Pain    HPI Lindsay Walsh is a 48 y.o. female.     HPI   Lindsay Walsh is a 48 y.o. female past medical history of autoimmune hepatitis, CVA, diabetes, fibromyalgia, hypertension, and GERD who presents to the Emergency Department complaining of left-sided chest pain that has been present for 2 days.  She describes the pain as sharp into her left upper chest into her left axilla.  Pain is reproducible with palpation.  Today, she states the chest pain  improved upon waking, but she reports having multiple episodes of nausea vomiting and diarrhea.  Family contacted EMS and she was brought to the emergency department for evaluation.  She endorses a history of acid reflux and states that she has occasional episodes of frequent vomiting.  She denies shortness of breath, abdominal pain, dysuria, fever, chills, and cough.  Past Medical History:  Diagnosis Date  . Angina    took SL nitro one week ago  . Arthritis    back  . Autoimmune hepatitis ([place])   . Chest pain    Normal stress echo in 2011; PVCs; pedal edema  . CVA (cerebral infarction)   . Depression   . Diabetes mellitus without complication ([place])   . Dyspareunia 05/06/2014  . Dysrhythmia    palpatations  . Fasting hyperglycemia   . Fibromyalgia   . Gastroesophageal reflux disease    Chronic abdominal pain; gastroparesis; globus hystericus; irritable bowel syndrome  . Hypertension   . Irritable bowel syndrome   . Narcotic dependence ([place])   . Overweight(278.02)   . PTSD (post-traumatic stress disorder)   . Pulmonary nodule   . Shortness of breath    with exertion  . Sleep apnea   . Sleep apnea    on CPAP machine  . Tobacco abuse    one pack per day; 35 pack years    Patient Active Problem List   Diagnosis Date Noted   . Hypothyroidism 08/22/2018  . DM type 2 causing vascular disease ([place]) 08/21/2018  . Mixed hyperlipidemia 08/21/2018  . Essential hypertension, benign 08/21/2018  . Influenza A 08/10/2018  . SIRS (systemic inflammatory response syndrome) ([place]) 08/10/2018  . Essential hypertension, malignant 03/07/2015  . CVA (cerebral infarction) 03/05/2015  . Dyspareunia 05/06/2014  . Obesity 09/30/2013  . Bilateral hip pain 07/16/2013  . Chronic pain syndrome 07/16/2013  . Degenerative disc disease, lumbar 07/16/2013  . Greater trochanteric bursitis of both hips 07/16/2013  . Nightmares associated with chronic post-traumatic stress disorder 10/18/2012  . Sprain of foot 03/14/2012  . Weakness 01/29/2012  . Hepatitis, autoimmune ([place]) 01/29/2012  . IBS (irritable bowel syndrome) 08/01/2011  . Gastroesophageal reflux disease   . Hypertension   . Depression   . Chest pain   . Fasting hyperglycemia   . TOBACCO ABUSE 04/28/2010  . NARCOTIC ABUSE 04/28/2010  . PULMONARY NODULE 04/28/2010  . Autoimmune hepatitis ([place]) 04/28/2010  . Myalgia and myositis 01/06/2009    Past Surgical History:  Procedure Laterality Date  . ABDOMINAL HYSTERECTOMY    . BACK SURGERY    . BLADDER SUSPENSION  09/12/2011   Procedure: TRANSVAGINAL TAPE (TVT) PROCEDURE;  Surgeon: [name], MD;  Location: [facility];  Service: Urology;  Laterality: N/A;  . CESAREAN SECTION  X3  . CHOLECYSTECTOMY    . ESOPHAGOGASTRODUODENOSCOPY ENDOSCOPY     "throat stretched" per pt.  Marland Kitchen LIVER BIOPSY     x4  . LUMBAR EPIDURAL INJECTION  01/2012  . TOTAL ABDOMINAL HYSTERECTOMY W/ BILATERAL SALPINGOOPHORECTOMY    . TUBAL LIGATION     Bilateral  . UPPER GASTROINTESTINAL ENDOSCOPY  09/13/2010     OB History    Gravida  3   Para  3   Term      Preterm      AB      Living  3     SAB      TAB      Ectopic      Multiple      Live Births               Home Medications    Prior to Admission medications    Medication Sig Start Date End Date Taking? Authorizing Provider  ammonium lactate (AMLACTIN) 12 % cream  12/14/16   [provider]  amphetamine-dextroamphetamine (ADDERALL) 20 MG tablet Take 1 tablet (20 mg total) by mouth 2 (two) times daily. 11/14/18 11/14/19  Cloria Spring, MD  amphetamine-dextroamphetamine (ADDERALL) 20 MG tablet Take 1 tablet (20 mg total) by mouth 2 (two) times daily. 11/14/18 11/14/19  Cloria Spring, MD  amphetamine-dextroamphetamine (ADDERALL) 20 MG tablet Take 1 tablet (20 mg total) by mouth 2 (two) times daily. 03/31/19 03/30/20  Cloria Spring, MD  Ascorbic Acid (VITAMIN C) 1000 MG tablet Take 1,000 mg by mouth every morning.     [provider]  aspirin 325 MG EC tablet Take 325 mg by mouth daily.    [provider]  atorvastatin (LIPITOR) 80 MG tablet Take 1 tablet by mouth daily. 12/17/16   [provider]  azaTHIOprine (IMURAN) 50 MG tablet TAKE 1 TABLET BY MOUTH ONCE DAILY 11/16/17   Setzer, Rona Ravens, NP  calcium-vitamin D (OS-CAL 500 + D) 500-200 MG-UNIT per tablet Take 1 tablet by mouth 2 (two) times daily.     [provider]  dicyclomine (BENTYL) 10 MG capsule Take 1 capsule (10 mg total) by mouth daily as needed. For cramps 03/08/15   Sinda Du, MD  DULoxetine (CYMBALTA) 60 MG capsule TAKE 1 CAPSULE(60 MG) BY MOUTH TWICE DAILY 11/14/18   Cloria Spring, MD  esomeprazole (NEXIUM) 40 MG capsule TAKE 1 CAPSULE BY MOUTH EVERY MORNING 09/09/18   [name], Mechele Dawley, MD  fexofenadine (ALLEGRA) 60 MG tablet Take 1 tablet by mouth 2 (two) times daily as needed. 06/27/10   [provider]  furosemide (LASIX) 40 MG tablet Take 40 mg by mouth every other day.     [provider]  GARLIC PO Take [id number] mg by mouth daily.     [provider]  hydrALAZINE (APRESOLINE) 50 MG tablet Take 1 tablet (50 mg total) by mouth every 8 (eight) hours. 03/08/15   Sinda Du, MD  isosorbide mononitrate (IMDUR) 30 MG 24 hr  tablet TAKE ONE (1) TABLET EACH DAY 03/09/14   Lendon Colonel, NP  levothyroxine (SYNTHROID, LEVOTHROID) 25 MCG tablet Take 25 mcg by mouth daily before breakfast.    [provider]  lisinopril (PRINIVIL,ZESTRIL) 40 MG tablet Take 40 mg by mouth daily.  09/23/12   [provider]  loperamide (IMODIUM) 2 MG capsule Take 1 capsule (2 mg total) by mouth 4 (four) times daily as needed for diarrhea or loose stools. 03/07/17  Long, Wonda Olds, MD  metFORMIN (GLUCOPHAGE) 1000 MG tablet Take 1 tablet (1,000 mg total) by mouth 2 (two) times daily with a meal. 08/28/18   Nida, Marella Chimes, MD  metoprolol succinate (TOPROL-XL) 100 MG 24 hr tablet TAKE 1 TABLET BY MOUTH EVERY DAY WITH FOOD 02/03/19   Josue Hector, MD  Multiple Vitamins-Minerals (CENTRUM) tablet Take 1 tablet by mouth daily.    [provider]  ondansetron (ZOFRAN) 4 MG tablet Take 1 tablet (4 mg total) by mouth every 6 (six) hours. 10/29/17   Hayden Rasmussen, MD  pramipexole (MIRAPEX) 0.25 MG tablet Take 0.25 mg by mouth at bedtime as needed. For restless legs    [provider]  prazosin (MINIPRESS) 2 MG capsule TAKE 1 CAPSULE(2 MG) BY MOUTH AT BEDTIME 11/14/18   Cloria Spring, MD  PROAIR HFA 108 (90 BASE) MCG/ACT inhaler Inhale 1-2 puffs into the lungs every 6 (six) hours as needed for wheezing or shortness of breath.  12/16/12   [provider]  Probiotic Product (RESTORA PO) Take by mouth daily.     [provider]    Family History Family History  Problem Relation Age of Onset  . Diabetes Mother   . Hypertension Mother   . Anxiety disorder Mother   . Depression Mother   . Drug abuse Mother   . Heart disease Father   . Diabetes Father   . Anxiety disorder Father   . Alcohol abuse Father   . Depression Father   . OCD Father   . Thyroid disease Sister   . Healthy Brother   . Healthy Daughter   . Healthy Son   . ADD / ADHD Son   . Alcohol abuse Paternal Grandfather    . Depression Paternal Grandfather   . Cancer Paternal Grandfather        lung,skin  . Tuberculosis Paternal Grandfather   . Seizures Paternal Grandmother   . Lupus Paternal Grandmother   . ADD / ADHD Son   . Anesthesia problems Neg Hx   . Malignant hyperthermia Neg Hx   . Pseudochol deficiency Neg Hx   . Hypotension Neg Hx   . Bipolar disorder Neg Hx   . Dementia Neg Hx   . Paranoid behavior Neg Hx   . Schizophrenia Neg Hx   . Sexual abuse Neg Hx   . Physical abuse Neg Hx     Social History Social History   Tobacco Use  . Smoking status: Former Smoker    Packs/day: 1.50    Years: 25.00    Pack years: 37.50    Types: Cigarettes    Quit date: 04/25/2013    Years since quitting: 5.9  . Smokeless tobacco: Never Used  . Tobacco comment: 15-20 cigs a day as of 11/12/2012  Substance Use Topics  . Alcohol use: No    Alcohol/week: 0.0 standard drinks  . Drug use: No     Allergies   Doxycycline, Fentanyl, Ceftin [cefuroxime axetil], Iohexol, Ivp dye [iodinated diagnostic agents], Norvasc [amlodipine besylate], Wellbutrin [bupropion hcl], Latex, and Tape   Review of Systems Review of Systems  Constitutional: Negative for activity change, appetite change, chills and fever.  HENT: Negative for sore throat.   Respiratory: Negative for cough, chest tightness and shortness of breath.   Cardiovascular: Positive for chest pain.  Gastrointestinal: Positive for diarrhea, nausea and vomiting. Negative for abdominal pain.  Genitourinary: Negative for difficulty urinating, dysuria and flank pain.  Musculoskeletal: Negative for arthralgias  and myalgias.  Skin: Negative for rash.  Neurological: Negative for dizziness, weakness, numbness and headaches.     Physical Exam Updated Vital Signs BP 104/71   Pulse 99   Temp 98.1 F (36.7 C) (Oral)   Resp 11   Ht 5' (1.524 m)   Wt 85.7 kg   SpO2 98%   BMI 36.90 kg/m   Physical Exam Vitals signs and nursing note reviewed.   Constitutional:      Appearance: She is well-developed. She is not ill-appearing or toxic-appearing.  HENT:     Head: Atraumatic.     Mouth/Throat:     Mouth: Mucous membranes are moist.     Pharynx: No oropharyngeal exudate or posterior oropharyngeal erythema.  Eyes:     Extraocular Movements: Extraocular movements intact.     Conjunctiva/sclera: Conjunctivae normal.     Pupils: Pupils are equal, round, and reactive to light.  Neck:     Musculoskeletal: Normal range of motion.  Cardiovascular:     Rate and Rhythm: Normal rate and regular rhythm.     Pulses: Normal pulses.  Pulmonary:     Effort: Pulmonary effort is normal.     Breath sounds: Normal breath sounds.  Chest:     Chest wall: Tenderness (ttp of the left upper chest wall.  no crepitus.  ) present.  Abdominal:     General: There is no distension.     Palpations: Abdomen is soft. There is no mass.     Tenderness: There is no abdominal tenderness. There is no guarding.  Musculoskeletal:     Right lower leg: No edema.     Left lower leg: No edema.  Skin:    General: Skin is warm.     Capillary Refill: Capillary refill takes less than 2 seconds.     Findings: No erythema or rash.  Neurological:     General: No focal deficit present.     Mental Status: She is alert.     Sensory: No sensory deficit.     Motor: No weakness.      ED Treatments / Results  Labs (all labs ordered are listed, but only abnormal results are displayed) Labs Reviewed  COMPREHENSIVE METABOLIC PANEL - Abnormal; Notable for the following components:      Result Value   Glucose, Bld 214 (*)    Total Protein 8.2 (*)    Alkaline Phosphatase 131 (*)    All other components within normal limits  CBC WITH DIFFERENTIAL/PLATELET - Abnormal; Notable for the following components:   WBC 21.8 (*)    Neutro Abs 19.3 (*)    Lymphs Abs 0.5 (*)    Monocytes Absolute 1.8 (*)    All other components within normal limits  URINALYSIS, ROUTINE W REFLEX  MICROSCOPIC - Abnormal; Notable for the following components:   Color, Urine AMBER (*)    APPearance CLOUDY (*)    Hgb urine dipstick SMALL (*)    Bilirubin Urine SMALL (*)    Protein, ur 100 (*)    Leukocytes,Ua MODERATE (*)    WBC, UA >50 (*)    Bacteria, UA MANY (*)    All other components within normal limits  URINE CULTURE  LIPASE, BLOOD  TROPONIN I (HIGH SENSITIVITY)  TROPONIN I (HIGH SENSITIVITY)    EKG EKG Interpretation  Date/Time:  Saturday April 05 2019 13:23:43 EDT Ventricular Rate:  86 PR Interval:    QRS Duration: 91 QT Interval:  390 QTC Calculation: 467 R Axis:  92 Text Interpretation:  Sinus rhythm Borderline right axis deviation No significant change since last tracing Confirmed by Fredia Sorrow [phone]) on 04/05/2019 1:42:11 PM   Radiology Dg Chest Portable 1 View  Result Date: 04/05/2019 CLINICAL DATA:  Chest pain EXAM: PORTABLE CHEST 1 VIEW COMPARISON:  08/10/2018 FINDINGS: The heart size and mediastinal contours are within normal limits. Both lungs are clear. The visualized skeletal structures are unremarkable. IMPRESSION: No acute abnormality of the lungs in AP portable projection. Electronically Signed   By: Eddie Candle M.D.   On: 04/05/2019 14:21    Procedures Procedures (including critical care time)  Medications Ordered in ED Medications  sodium chloride 0.9 % bolus 1,000 mL (1,000 mLs Intravenous New Bag/Given 04/05/19 1535)     Initial Impression / Assessment and Plan / ED Course  I have reviewed the triage vital signs and the nursing notes.  Pertinent labs & imaging results that were available during my care of the patient were reviewed by me and considered in my medical decision making (see chart for details).        pt with reproducible chest pain that has improved prior to ER arrival.  No tachypnea, tachycardia, or hypoxia.  Low clinical suspicion for ACS or sepsis.  Patient complained of vomiting and diarrhea upon arrival.   Symptoms have improved after given antiemetic by EMS.  She has tolerated oral fluids here without difficulty.  She does have a leukocytosis, but she is nontoxic-appearing and afebrile, she reports hx of recurrent elevated WBC count, etiology is unclear.  She is currently on a immunosuppressant.  Her urinalysis does show a likely infection, so I will start a fluoroquinolone. Her abd is non-tender and clinical suspicion for acute abd is low.  Discussed findings with Dr. Roderic Palau.  She appears appropriate for discharge home, she agrees to close outpatient follow-up and strict return precautions were discussed.  Final Clinical Impressions(s) / ED Diagnoses   Final diagnoses:  Nausea vomiting and diarrhea  Precordial pain    ED Discharge Orders    None       Kem Parkinson, PA-C 04/05/19 1741    Fredia Sorrow, MD 04/07/19 1221

## 2019-04-05 NOTE — Discharge Instructions (Addendum)
Take the antibiotic as directed until its finished.  Is important that you drink plenty of water.  Follow-up with Dr. Luan Pulling next week for recheck.  Return to the ER for any worsening symptoms.

## 2019-04-05 NOTE — ED Triage Notes (Signed)
Chest pain with N/V/D  Afebrile per EMS

## 2019-04-07 LAB — URINE CULTURE

## 2019-04-11 ENCOUNTER — Ambulatory Visit (HOSPITAL_COMMUNITY): Payer: Medicare Other | Admitting: Psychiatry

## 2019-04-11 ENCOUNTER — Other Ambulatory Visit: Payer: Self-pay

## 2019-04-11 ENCOUNTER — Telehealth (HOSPITAL_COMMUNITY): Payer: Self-pay | Admitting: Psychiatry

## 2019-04-13 DIAGNOSIS — J449 Chronic obstructive pulmonary disease, unspecified: Secondary | ICD-10-CM | POA: Diagnosis not present

## 2019-04-16 ENCOUNTER — Other Ambulatory Visit (INDEPENDENT_AMBULATORY_CARE_PROVIDER_SITE_OTHER): Payer: Self-pay | Admitting: Internal Medicine

## 2019-04-16 DIAGNOSIS — N390 Urinary tract infection, site not specified: Secondary | ICD-10-CM | POA: Diagnosis not present

## 2019-04-16 MED ORDER — AZATHIOPRINE 50 MG PO TABS: 50.0000 mg | ORAL_TABLET | Freq: Every day | ORAL | 5 refills | Status: DC

## 2019-04-16 NOTE — Progress Notes (Signed)
Prescription for azathioprine 50 mg daily sent to patient's pharmacy. WBC on 08/12/2018 was 3.3. WBC on 04/05/2019 was 21.8 when she was seen emergency room. LFTs were normal on 04/05/2019 except AP was 131.   Patient needs CBC with differential and LFTs this month. She also needs office visit. Last visit was in April 2019 with Ms. Setzer NP

## 2019-04-23 DIAGNOSIS — M545 Low back pain: Secondary | ICD-10-CM | POA: Diagnosis not present

## 2019-04-23 DIAGNOSIS — E1165 Type 2 diabetes mellitus with hyperglycemia: Secondary | ICD-10-CM | POA: Diagnosis not present

## 2019-04-23 DIAGNOSIS — I10 Essential (primary) hypertension: Secondary | ICD-10-CM | POA: Diagnosis not present

## 2019-04-25 DIAGNOSIS — M545 Low back pain: Secondary | ICD-10-CM | POA: Diagnosis not present

## 2019-04-25 DIAGNOSIS — I10 Essential (primary) hypertension: Secondary | ICD-10-CM | POA: Diagnosis not present

## 2019-04-25 DIAGNOSIS — E1165 Type 2 diabetes mellitus with hyperglycemia: Secondary | ICD-10-CM | POA: Diagnosis not present

## 2019-04-25 LAB — LIPID PANEL
Cholesterol: 153 (ref 0–200)
HDL: 37 (ref 35–70)
LDL Cholesterol: 91
Triglycerides: 141 (ref 40–160)

## 2019-04-25 LAB — BASIC METABOLIC PANEL
BUN: 10 (ref 4–21)
CO2: 28 — AB (ref 13–22)
Chloride: 97 — AB (ref 99–108)
Creatinine: 0.7 (ref <=1.1)
Glucose: 171
Potassium: 4.3 (ref 3.4–5.3)
Sodium: 139 (ref 137–147)

## 2019-04-25 LAB — CBC AND DIFFERENTIAL
HCT: 40 (ref 36–46)
Hemoglobin: 13.5 (ref 12.0–16.0)
Neutrophils Absolute: 8
Platelets: 346 (ref 150–399)
WBC: 11.8

## 2019-04-25 LAB — HEPATIC FUNCTION PANEL
ALT: 21 (ref 7–35)
AST: 18 (ref 13–35)
Alkaline Phosphatase: 145 — AB (ref 25–125)

## 2019-04-25 LAB — TSH: TSH: 3.34 (ref <=5.90)

## 2019-04-25 LAB — HEMOGLOBIN A1C: Hemoglobin A1C: 7.2

## 2019-04-25 LAB — COMPREHENSIVE METABOLIC PANEL
Albumin: 4 (ref 3.5–5.0)
Calcium: 9.8 (ref 8.7–10.7)
GFR calc Af Amer: 112
GFR calc non Af Amer: 97

## 2019-04-25 LAB — CBC: RBC: 4.54 (ref 3.87–5.11)

## 2019-04-28 DIAGNOSIS — H2513 Age-related nuclear cataract, bilateral: Secondary | ICD-10-CM | POA: Diagnosis not present

## 2019-04-28 DIAGNOSIS — H04123 Dry eye syndrome of bilateral lacrimal glands: Secondary | ICD-10-CM | POA: Diagnosis not present

## 2019-04-28 DIAGNOSIS — H4089 Other specified glaucoma: Secondary | ICD-10-CM | POA: Diagnosis not present

## 2019-04-28 DIAGNOSIS — H401131 Primary open-angle glaucoma, bilateral, mild stage: Secondary | ICD-10-CM | POA: Diagnosis not present

## 2019-04-28 DIAGNOSIS — H16223 Keratoconjunctivitis sicca, not specified as Sjogren's, bilateral: Secondary | ICD-10-CM | POA: Diagnosis not present

## 2019-04-29 ENCOUNTER — Other Ambulatory Visit (HOSPITAL_COMMUNITY): Payer: Self-pay | Admitting: Pulmonary Disease

## 2019-04-29 DIAGNOSIS — Z1231 Encounter for screening mammogram for malignant neoplasm of breast: Secondary | ICD-10-CM

## 2019-05-01 ENCOUNTER — Ambulatory Visit (HOSPITAL_COMMUNITY)
Admission: RE | Admit: 2019-05-01 | Discharge: 2019-05-01 | Disposition: A | Discharge status: Home / Self Care | Payer: Medicare Other | Source: Ambulatory Visit | Attending: Pulmonary Disease | Admitting: Pulmonary Disease

## 2019-05-01 ENCOUNTER — Other Ambulatory Visit: Payer: Self-pay

## 2019-05-01 DIAGNOSIS — Z1231 Encounter for screening mammogram for malignant neoplasm of breast: Secondary | ICD-10-CM | POA: Diagnosis not present

## 2019-05-05 DIAGNOSIS — Z1231 Encounter for screening mammogram for malignant neoplasm of breast: Secondary | ICD-10-CM | POA: Diagnosis not present

## 2019-05-05 LAB — HM MAMMOGRAPHY

## 2019-05-11 ENCOUNTER — Other Ambulatory Visit: Payer: Self-pay | Admitting: Cardiovascular Disease

## 2019-05-14 DIAGNOSIS — J449 Chronic obstructive pulmonary disease, unspecified: Secondary | ICD-10-CM | POA: Diagnosis not present

## 2019-05-23 DIAGNOSIS — I10 Essential (primary) hypertension: Secondary | ICD-10-CM | POA: Diagnosis not present

## 2019-05-23 DIAGNOSIS — G4733 Obstructive sleep apnea (adult) (pediatric): Secondary | ICD-10-CM | POA: Diagnosis not present

## 2019-05-23 DIAGNOSIS — E1165 Type 2 diabetes mellitus with hyperglycemia: Secondary | ICD-10-CM | POA: Diagnosis not present

## 2019-06-06 DIAGNOSIS — M79671 Pain in right foot: Secondary | ICD-10-CM

## 2019-06-06 DIAGNOSIS — I10 Essential (primary) hypertension: Secondary | ICD-10-CM | POA: Insufficient documentation

## 2019-06-06 DIAGNOSIS — E1165 Type 2 diabetes mellitus with hyperglycemia: Secondary | ICD-10-CM

## 2019-06-06 DIAGNOSIS — G4730 Sleep apnea, unspecified: Secondary | ICD-10-CM | POA: Insufficient documentation

## 2019-06-06 DIAGNOSIS — G609 Hereditary and idiopathic neuropathy, unspecified: Secondary | ICD-10-CM | POA: Insufficient documentation

## 2019-06-06 DIAGNOSIS — I880 Nonspecific mesenteric lymphadenitis: Secondary | ICD-10-CM | POA: Insufficient documentation

## 2019-06-06 DIAGNOSIS — F329 Major depressive disorder, single episode, unspecified: Secondary | ICD-10-CM | POA: Insufficient documentation

## 2019-06-06 DIAGNOSIS — G2581 Restless legs syndrome: Secondary | ICD-10-CM | POA: Insufficient documentation

## 2019-06-06 DIAGNOSIS — M797 Fibromyalgia: Secondary | ICD-10-CM | POA: Insufficient documentation

## 2019-06-18 ENCOUNTER — Other Ambulatory Visit: Payer: Self-pay

## 2019-06-18 ENCOUNTER — Encounter (HOSPITAL_COMMUNITY): Payer: Self-pay | Admitting: Psychiatry

## 2019-06-18 ENCOUNTER — Ambulatory Visit (INDEPENDENT_AMBULATORY_CARE_PROVIDER_SITE_OTHER): Payer: Medicare Other | Admitting: Psychiatry

## 2019-06-18 DIAGNOSIS — F515 Nightmare disorder: Secondary | ICD-10-CM

## 2019-06-18 DIAGNOSIS — F4310 Post-traumatic stress disorder, unspecified: Secondary | ICD-10-CM

## 2019-06-18 DIAGNOSIS — F900 Attention-deficit hyperactivity disorder, predominantly inattentive type: Secondary | ICD-10-CM

## 2019-06-18 DIAGNOSIS — F321 Major depressive disorder, single episode, moderate: Secondary | ICD-10-CM

## 2019-06-18 DIAGNOSIS — F4312 Post-traumatic stress disorder, chronic: Secondary | ICD-10-CM

## 2019-06-18 MED ORDER — PRAZOSIN HCL 2 MG PO CAPS: ORAL_CAPSULE | ORAL | 2 refills | Status: DC

## 2019-06-18 MED ORDER — AMPHETAMINE-DEXTROAMPHETAMINE 20 MG PO TABS: 20.0000 mg | ORAL_TABLET | Freq: Two times a day (BID) | ORAL | 0 refills | Status: DC

## 2019-06-18 MED ORDER — DULOXETINE HCL 60 MG PO CPEP: ORAL_CAPSULE | ORAL | 2 refills | Status: DC
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
## 2019-07-06 ENCOUNTER — Emergency Department (HOSPITAL_COMMUNITY)
Admission: EM | Admit: 2019-07-06 | Discharge: 2019-07-06 | Disposition: A | Discharge status: Home / Self Care | Payer: Medicare Other | Attending: Emergency Medicine | Admitting: Emergency Medicine

## 2019-07-06 ENCOUNTER — Encounter (HOSPITAL_COMMUNITY): Payer: Self-pay

## 2019-07-06 DIAGNOSIS — E1165 Type 2 diabetes mellitus with hyperglycemia: Secondary | ICD-10-CM | POA: Diagnosis not present

## 2019-07-06 DIAGNOSIS — Z91040 Latex allergy status: Secondary | ICD-10-CM | POA: Insufficient documentation

## 2019-07-06 DIAGNOSIS — R197 Diarrhea, unspecified: Secondary | ICD-10-CM | POA: Diagnosis not present

## 2019-07-06 DIAGNOSIS — R112 Nausea with vomiting, unspecified: Secondary | ICD-10-CM | POA: Diagnosis not present

## 2019-07-06 DIAGNOSIS — E039 Hypothyroidism, unspecified: Secondary | ICD-10-CM | POA: Diagnosis not present

## 2019-07-06 DIAGNOSIS — Z7982 Long term (current) use of aspirin: Secondary | ICD-10-CM | POA: Insufficient documentation

## 2019-07-06 DIAGNOSIS — Z79899 Other long term (current) drug therapy: Secondary | ICD-10-CM | POA: Diagnosis not present

## 2019-07-06 DIAGNOSIS — N390 Urinary tract infection, site not specified: Secondary | ICD-10-CM | POA: Insufficient documentation

## 2019-07-06 DIAGNOSIS — R1110 Vomiting, unspecified: Secondary | ICD-10-CM | POA: Diagnosis present

## 2019-07-06 DIAGNOSIS — E1159 Type 2 diabetes mellitus with other circulatory complications: Secondary | ICD-10-CM | POA: Insufficient documentation

## 2019-07-06 DIAGNOSIS — Z7984 Long term (current) use of oral hypoglycemic drugs: Secondary | ICD-10-CM | POA: Diagnosis not present

## 2019-07-06 DIAGNOSIS — I10 Essential (primary) hypertension: Secondary | ICD-10-CM | POA: Insufficient documentation

## 2019-07-06 LAB — CBC WITH DIFFERENTIAL/PLATELET
Abs Immature Granulocytes: 0.05 10*3/uL (ref 0.00–0.07)
Basophils Absolute: 0.1 10*3/uL (ref 0.0–0.1)
Basophils Relative: 1 %
Eosinophils Absolute: 0.2 10*3/uL (ref 0.0–0.5)
Eosinophils Relative: 1 %
HCT: 37.8 % (ref 36.0–46.0)
Hemoglobin: 12.8 g/dL (ref 12.0–15.0)
Immature Granulocytes: 0 %
Lymphocytes Relative: 7 %
Lymphs Abs: 0.9 10*3/uL (ref 0.7–4.0)
MCH: 30.9 pg (ref 26.0–34.0)
MCHC: 33.9 g/dL (ref 30.0–36.0)
MCV: 91.3 fL (ref 80.0–100.0)
Monocytes Absolute: 0.7 10*3/uL (ref 0.1–1.0)
Monocytes Relative: 6 %
Neutro Abs: 10.8 10*3/uL — ABNORMAL HIGH (ref 1.7–7.7)
Neutrophils Relative %: 85 %
Platelets: 258 10*3/uL (ref 150–400)
RBC: 4.14 MIL/uL (ref 3.87–5.11)
RDW: 12.5 % (ref 11.5–15.5)
WBC: 12.7 10*3/uL — ABNORMAL HIGH (ref 4.0–10.5)
nRBC: 0 % (ref 0.0–0.2)

## 2019-07-06 LAB — URINALYSIS, ROUTINE W REFLEX MICROSCOPIC
Bilirubin Urine: NEGATIVE
Glucose, UA: NEGATIVE mg/dL
Ketones, ur: NEGATIVE mg/dL
Nitrite: NEGATIVE
Protein, ur: NEGATIVE mg/dL
Specific Gravity, Urine: 1.011 (ref 1.005–1.030)
WBC, UA: >50 WBC/hpf — ABNORMAL HIGH (ref 0–5)
pH: 6 (ref 5.0–8.0)

## 2019-07-06 LAB — CBG MONITORING, ED: Glucose-Capillary: 175 mg/dL — ABNORMAL HIGH (ref 70–99)

## 2019-07-06 LAB — COMPREHENSIVE METABOLIC PANEL
ALT: 21 U/L (ref 0–44)
AST: 20 U/L (ref 15–41)
Albumin: 3.7 g/dL (ref 3.5–5.0)
Alkaline Phosphatase: 95 U/L (ref 38–126)
Anion gap: 9 (ref 5–15)
BUN: 14 mg/dL (ref 6–20)
CO2: 26 mmol/L (ref 22–32)
Calcium: 8.6 mg/dL — ABNORMAL LOW (ref 8.9–10.3)
Chloride: 102 mmol/L (ref 98–111)
Creatinine, Ser: 0.58 mg/dL (ref 0.44–1.00)
GFR calc Af Amer: >60 mL/min (ref >60)
GFR calc non Af Amer: >60 mL/min (ref >60)
Glucose, Bld: 201 mg/dL — ABNORMAL HIGH (ref 70–99)
Potassium: 4 mmol/L (ref 3.5–5.1)
Sodium: 137 mmol/L (ref 135–145)
Total Bilirubin: 0.7 mg/dL (ref 0.3–1.2)
Total Protein: 7.2 g/dL (ref 6.5–8.1)

## 2019-07-06 LAB — LIPASE, BLOOD: Lipase: 28 U/L (ref 11–51)

## 2019-07-06 MED ORDER — SODIUM CHLORIDE 0.9 % IV SOLN
1.0000 g | Freq: Once | INTRAVENOUS | Status: AC
  Administered 2019-07-06: 1 g via INTRAVENOUS
  Filled 2019-07-06: qty 10

## 2019-07-06 MED ORDER — SULFAMETHOXAZOLE-TRIMETHOPRIM 800-160 MG PO TABS: 1.0000 | ORAL_TABLET | Freq: Two times a day (BID) | ORAL | 0 refills | Status: DC

## 2019-07-06 MED ORDER — ONDANSETRON HCL 4 MG/2ML IJ SOLN
4.0000 mg | Freq: Once | INTRAMUSCULAR | Status: AC
  Administered 2019-07-06: 11:00:00 4 mg via INTRAVENOUS
  Filled 2019-07-06: qty 2

## 2019-07-06 MED ORDER — SODIUM CHLORIDE 0.9 % IV BOLUS
1000.0000 mL | Freq: Once | INTRAVENOUS | Status: AC
  Administered 2019-07-06: 1000 mL via INTRAVENOUS

## 2019-07-06 MED ORDER — ONDANSETRON HCL 4 MG PO TABS: 4.0000 mg | ORAL_TABLET | Freq: Three times a day (TID) | ORAL | 0 refills | Status: DC | PRN

## 2019-07-06 NOTE — ED Triage Notes (Signed)
Patient c/o nausea and vomiting x 2 hours. Denies any and pain, diarrhea, fevers, or urinary symptoms. Patient states she has had this x2 years, intermittently and has had endoscopy and colonoscopy. Per patient "usually gets IV fluids and is discharges in which she will go to [facility]." Patient has prescribed phenergan but denies taking any today.

## 2019-07-06 NOTE — ED Notes (Addendum)
Unsuccessful IV attempt x2. Am getting Mandi to stick

## 2019-07-06 NOTE — ED Provider Notes (Signed)
[facility] EMERGENCY DEPARTMENT Provider Note   CSN: [id number] Arrival date & time: 07/06/19  [id number]     History Chief Complaint  Patient presents with  . Emesis    Lindsay Walsh is a 48 y.o. female.  The history is provided by the patient and medical records. No language interpreter was used.       48 year old female with history of autoimmune hepatitis, diabetes, Fibromyalgia, GERD, IBS, opiate dependency, obesity, hypertension, presenting complaining of nausea vomiting.  Patient reports she has recurrent episodes of nausea and vomiting throughout the past 2 years in which she has been evaluated multiple times in the past without any official diagnosis.  Evaluation including endoscopy and colonoscopy.  She report today she felt nauseous, vomited approximately 7 episodes of nonbloody nonbilious contents and some loose stools.  Her nausea has persist without any significant pain.  No associated fever or chills no lightheadedness, dizziness, productive cough, runny nose sneezing coughing sore throat loss of taste or smell chest pain abdominal pain back pain or dysuria.  She denies any recent alcohol or drug use.  Denies any recent medications changes.  She report having similar episodes like this every several weeks requiring ER visits for antinausea medication and IV fluid and sleep.  Prior hysterectomy as well as cholecystectomy.  Past Medical History:  Diagnosis Date  . Allergic rhinitis due to pollen   . Angina    took SL nitro one week ago  . Arthritis    back  . Autoimmune hepatitis ([place])   . Back pain   . Chest pain    Normal stress echo in 2011; PVCs; pedal edema  . CVA (cerebral infarction)   . Depression   . Diabetes mellitus without complication ([place])   . Dizziness and giddiness   . Dyspareunia 05/06/2014  . Dysrhythmia    palpatations  . Essential (primary) hypertension   . Fasting hyperglycemia   . Fibromyalgia   . Fibromyalgia   . Gastroesophageal reflux  disease    Chronic abdominal pain; gastroparesis; globus hystericus; irritable bowel syndrome  . Hereditary and idiopathic neuropathy, unspecified   . Hyperlipidemia   . Hypertension   . Impaired glucose tolerance   . Irritable bowel syndrome   . Major depressive disorder, single episode, unspecified   . Morbid (severe) obesity due to excess calories ([place])   . Narcotic dependence ([place])   . Nausea   . Nonspecific mesenteric lymphadenitis   . Overweight(278.02)   . Pain in limb   . Pain in right foot   . PTSD (post-traumatic stress disorder)   . Pulmonary nodule   . Restless legs syndrome   . Shortness of breath    with exertion  . Sleep apnea   . Sleep apnea    on CPAP machine  . Sleep apnea, unspecified   . Tobacco abuse    one pack per day; 35 pack years  . Type 2 diabetes mellitus with hyperglycemia [facility])     Patient Active Problem List   Diagnosis Date Noted  . Morbid (severe) obesity due to excess calories ([place])   . Essential (primary) hypertension   . Major depressive disorder, single episode, unspecified   . Restless legs syndrome   . Sleep apnea, unspecified   . Fibromyalgia   . Hereditary and idiopathic neuropathy, unspecified   . Nonspecific mesenteric lymphadenitis   . Pain in right foot   . Type 2 diabetes mellitus with hyperglycemia ([place])   . Hypothyroidism 08/22/2018  .  DM type 2 causing vascular disease ([place]) 08/21/2018  . Mixed hyperlipidemia 08/21/2018  . Essential hypertension, benign 08/21/2018  . Influenza A 08/10/2018  . SIRS (systemic inflammatory response syndrome) ([place]) 08/10/2018  . Essential hypertension, malignant 03/07/2015  . CVA (cerebral infarction) 03/05/2015  . Dyspareunia 05/06/2014  . Obesity 09/30/2013  . Bilateral hip pain 07/16/2013  . Chronic pain syndrome 07/16/2013  . Degenerative disc disease, lumbar 07/16/2013  . Greater trochanteric bursitis of both hips 07/16/2013  . Nightmares associated with chronic post-traumatic  stress disorder 10/18/2012  . Sprain of foot 03/14/2012  . Weakness 01/29/2012  . Hepatitis, autoimmune ([place]) 01/29/2012  . IBS (irritable bowel syndrome) 08/01/2011  . Gastroesophageal reflux disease   . Hypertension   . Depression   . Chest pain   . Fasting hyperglycemia   . TOBACCO ABUSE 04/28/2010  . NARCOTIC ABUSE 04/28/2010  . PULMONARY NODULE 04/28/2010  . Autoimmune hepatitis ([place]) 04/28/2010  . Myalgia and myositis 01/06/2009    Past Surgical History:  Procedure Laterality Date  . ABDOMINAL HYSTERECTOMY    . BACK SURGERY    . BLADDER SUSPENSION  09/12/2011   Procedure: TRANSVAGINAL TAPE (TVT) PROCEDURE;  Surgeon: [name], MD;  Location: [facility];  Service: Urology;  Laterality: N/A;  . CESAREAN SECTION     X3  . CHOLECYSTECTOMY    . ESOPHAGOGASTRODUODENOSCOPY ENDOSCOPY     "throat stretched" per pt.  Marland Kitchen LIVER BIOPSY     x4  . LUMBAR EPIDURAL INJECTION  01/2012  . TOTAL ABDOMINAL HYSTERECTOMY W/ BILATERAL SALPINGOOPHORECTOMY    . TUBAL LIGATION     Bilateral  . UPPER GASTROINTESTINAL ENDOSCOPY  09/13/2010     OB History    Gravida  3   Para  3   Term      Preterm      AB      Living  3     SAB      TAB      Ectopic      Multiple      Live Births              Family History  Problem Relation Age of Onset  . Diabetes Mother   . Hypertension Mother   . Anxiety disorder Mother   . Depression Mother   . Drug abuse Mother   . Heart disease Father   . Diabetes Father   . Anxiety disorder Father   . Alcohol abuse Father   . Depression Father   . OCD Father   . Thyroid disease Sister   . Healthy Brother   . Healthy Daughter   . Healthy Son   . ADD / ADHD Son   . Alcohol abuse Paternal Grandfather   . Depression Paternal Grandfather   . Cancer Paternal Grandfather        lung,skin  . Tuberculosis Paternal Grandfather   . Seizures Paternal Grandmother   . Lupus Paternal Grandmother   . ADD / ADHD Son   . Anesthesia problems  Neg Hx   . Malignant hyperthermia Neg Hx   . Pseudochol deficiency Neg Hx   . Hypotension Neg Hx   . Bipolar disorder Neg Hx   . Dementia Neg Hx   . Paranoid behavior Neg Hx   . Schizophrenia Neg Hx   . Sexual abuse Neg Hx   . Physical abuse Neg Hx     Social History   Tobacco Use  . Smoking status: Former Smoker  Packs/day: 1.50    Years: 25.00    Pack years: 37.50    Types: Cigarettes    Quit date: 04/25/2013    Years since quitting: 6.2  . Smokeless tobacco: Never Used  . Tobacco comment: 15-20 cigs a day as of 11/12/2012  Substance Use Topics  . Alcohol use: No    Alcohol/week: 0.0 standard drinks  . Drug use: No    Home Medications Prior to Admission medications   Medication Sig Start Date End Date Taking? Authorizing Provider  ammonium lactate (AMLACTIN) 12 % cream  12/14/16   [provider]  amphetamine-dextroamphetamine (ADDERALL) 20 MG tablet Take 1 tablet (20 mg total) by mouth 2 (two) times daily. 06/18/19 06/17/20  Cloria Spring, MD  amphetamine-dextroamphetamine (ADDERALL) 20 MG tablet Take 1 tablet (20 mg total) by mouth 2 (two) times daily. 06/18/19 06/17/20  Cloria Spring, MD  amphetamine-dextroamphetamine (ADDERALL) 20 MG tablet Take 1 tablet (20 mg total) by mouth 2 (two) times daily. 06/18/19 06/17/20  Cloria Spring, MD  Ascorbic Acid (VITAMIN C) 1000 MG tablet Take 1,000 mg by mouth every morning.     [provider]  aspirin 325 MG EC tablet Take 325 mg by mouth daily.    [provider]  atorvastatin (LIPITOR) 80 MG tablet Take 1 tablet by mouth daily. 12/17/16   [provider]  azaTHIOprine (IMURAN) 50 MG tablet Take 1 tablet (50 mg total) by mouth daily. 04/16/19   [name], Mechele Dawley, MD  calcium-vitamin D (OS-CAL 500 + D) 500-200 MG-UNIT per tablet Take 1 tablet by mouth 2 (two) times daily.     [provider]  ciprofloxacin (CIPRO) 500 MG tablet Take 1 tablet (500 mg total) by mouth 2 (two) times daily.  04/05/19   Triplett, Tammy, PA-C  dicyclomine (BENTYL) 10 MG capsule Take 1 capsule (10 mg total) by mouth daily as needed. For cramps 03/08/15   Sinda Du, MD  DULoxetine (CYMBALTA) 60 MG capsule TAKE 1 CAPSULE(60 MG) BY MOUTH TWICE DAILY 06/18/19   Cloria Spring, MD  esomeprazole (NEXIUM) 40 MG capsule TAKE 1 CAPSULE BY MOUTH EVERY MORNING 09/09/18   [name], Mechele Dawley, MD  fexofenadine (ALLEGRA) 60 MG tablet Take 1 tablet by mouth 2 (two) times daily as needed. 06/27/10   [provider]  furosemide (LASIX) 40 MG tablet Take 40 mg by mouth every other day.     [provider]  GARLIC PO Take [id number] mg by mouth daily.     [provider]  hydrALAZINE (APRESOLINE) 50 MG tablet Take 1 tablet (50 mg total) by mouth every 8 (eight) hours. 03/08/15   Sinda Du, MD  isosorbide mononitrate (IMDUR) 30 MG 24 hr tablet TAKE ONE (1) TABLET EACH DAY 03/09/14   Lendon Colonel, NP  levothyroxine (SYNTHROID, LEVOTHROID) 25 MCG tablet Take 25 mcg by mouth daily before breakfast.    [provider]  lisinopril (PRINIVIL,ZESTRIL) 40 MG tablet Take 40 mg by mouth daily.  09/23/12   [provider]  loperamide (IMODIUM) 2 MG capsule Take 1 capsule (2 mg total) by mouth 4 (four) times daily as needed for diarrhea or loose stools. 03/07/17   Long, Wonda Olds, MD  metFORMIN (GLUCOPHAGE) 1000 MG tablet Take 1 tablet (1,000 mg total) by mouth 2 (two) times daily with a meal. 08/28/18   Nida, Marella Chimes, MD  metoprolol succinate (TOPROL-XL) 100 MG 24 hr tablet TAKE 1 TABLET BY MOUTH EVERY DAY WITH FOOD  05/12/19   Josue Hector, MD  Multiple Vitamins-Minerals (CENTRUM) tablet Take 1 tablet by mouth daily.    [provider]  ondansetron (ZOFRAN) 4 MG tablet Take 1 tablet (4 mg total) by mouth every 6 (six) hours. 10/29/17   Hayden Rasmussen, MD  pramipexole (MIRAPEX) 0.25 MG tablet Take 0.25 mg by mouth at bedtime as needed. For restless legs    [provider]  prazosin (MINIPRESS) 2 MG capsule TAKE 1 CAPSULE(2 MG) BY MOUTH AT BEDTIME 06/18/19   Cloria Spring, MD  PROAIR HFA 108 (90 BASE) MCG/ACT inhaler Inhale 1-2 puffs into the lungs every 6 (six) hours as needed for wheezing or shortness of breath.  12/16/12   [provider]  Probiotic Product (RESTORA PO) Take by mouth daily.     [provider]  promethazine (PHENERGAN) 25 MG tablet Take 1 tablet (25 mg total) by mouth every 6 (six) hours as needed for nausea or vomiting. May cause drowsiness 04/05/19   Triplett, Tammy, PA-C    Allergies    Doxycycline, Fentanyl, Ceftin [cefuroxime axetil], Iohexol, Ivp dye [iodinated diagnostic agents], Norvasc [amlodipine besylate], Wellbutrin [bupropion hcl], Latex, and Tape  Review of Systems   Review of Systems  All other systems reviewed and are negative.   Physical Exam Updated Vital Signs BP 140/89 (BP Location: Right Arm)   Pulse 89   Temp 97.9 F (36.6 C) (Oral)   Resp 12   SpO2 99%   Physical Exam Vitals and nursing note reviewed.  Constitutional:      General: She is not in acute distress.    Appearance: She is well-developed. She is obese.  HENT:     Head: Atraumatic.  Eyes:     Conjunctiva/sclera: Conjunctivae normal.  Cardiovascular:     Rate and Rhythm: Normal rate and regular rhythm.     Pulses: Normal pulses.     Heart sounds: Normal heart sounds.  Pulmonary:     Effort: Pulmonary effort is normal.     Breath sounds: Normal breath sounds.  Abdominal:     Palpations: Abdomen is soft.     Tenderness: There is no abdominal tenderness.  Musculoskeletal:     Cervical back: Neck supple.  Skin:    Findings: No rash.  Neurological:     Mental Status: She is alert and oriented to person, place, and time.  Psychiatric:        Mood and Affect: Mood normal.     ED Results / Procedures / Treatments   Labs (all labs ordered are listed, but only abnormal results are displayed) Labs Reviewed   CBC WITH DIFFERENTIAL/PLATELET - Abnormal; Notable for the following components:      Result Value   WBC 12.7 (*)    Neutro Abs 10.8 (*)    All other components within normal limits  COMPREHENSIVE METABOLIC PANEL - Abnormal; Notable for the following components:   Glucose, Bld 201 (*)    Calcium 8.6 (*)    All other components within normal limits  URINALYSIS, ROUTINE W REFLEX MICROSCOPIC - Abnormal; Notable for the following components:   APPearance CLOUDY (*)    Hgb urine dipstick SMALL (*)    Leukocytes,Ua LARGE (*)    WBC, UA >50 (*)    Bacteria, UA FEW (*)    All other components within normal limits  CBG MONITORING, ED - Abnormal; Notable for the following components:   Glucose-Capillary 175 (*)    All other components within  normal limits  URINE CULTURE  LIPASE, BLOOD    EKG None  Radiology No results found.  Procedures Procedures (including critical care time)  Medications Ordered in ED Medications  cefTRIAXone (ROCEPHIN) 1 g in sodium chloride 0.9 % 100 mL IVPB (has no administration in time range)  ondansetron (ZOFRAN) injection 4 mg (4 mg Intravenous Given 07/06/19 1038)  sodium chloride 0.9 % bolus 1,000 mL (0 mLs Intravenous Stopped 07/06/19 1213)    ED Course  I have reviewed the triage vital signs and the nursing notes.  Pertinent labs & imaging results that were available during my care of the patient were reviewed by me and considered in my medical decision making (see chart for details).    MDM Rules/Calculators/A&P                      BP 140/89 (BP Location: Right Arm)   Pulse 89   Temp 97.9 F (36.6 C) (Oral)   Resp 12   SpO2 99%   Final Clinical Impression(s) / ED Diagnoses Final diagnoses:  Lower urinary tract infectious disease  Nausea vomiting and diarrhea    Rx / DC Orders ED Discharge Orders         Ordered    sulfamethoxazole-trimethoprim (BACTRIM DS) 800-160 MG tablet  2 times daily     07/06/19 1450    ondansetron  (ZOFRAN) 4 MG tablet  Every 8 hours PRN     07/06/19 1450         9:43 AM Patient with history of recurrent nausea vomiting, history of diabetes not well controlled, presenting with complaints of nausea and vomiting that started earlier today as well as having some loose stool.  She does not report any infectious symptoms.  She does not have any significant abdominal pain on exam.  No chest pain.  Plan to treat symptoms.  Doubt cardiopulmonary etiology.  1:15 PM Labs mostly reassuring, UA concerning for UTI.  Urine culture sent.  Will give antibiotic here.  At this time patient felt better with IV fluid and antinausea medication.  She is tolerating p.o.  2:48 PM Patient received IV antibiotic.  She did not develop any abnormal reaction.  She tolerates p.o.  She is stable for discharge home with antibiotic for UTI as well as antinausea medication.  Patient should follow-up with her PCP for recheck.  Her blood pressure is bit high today.  Return precaution discussed.   Domenic Moras, PA-C 07/06/19 1452    Noemi Chapel, MD 07/07/19 [phone]

## 2019-07-06 NOTE — Discharge Instructions (Addendum)
Take antinausea medication as needed for nausea.  Stay hydrated, drink plenty of fluids.  Take antibiotic as prescribed for urinary tract infection.  Follow-up with your doctor for recheck.  Your blood pressure is high today and will need to be recheck at your earliest convenient.

## 2019-07-07 LAB — URINE CULTURE: Culture: NO GROWTH

## 2019-07-09 ENCOUNTER — Other Ambulatory Visit: Payer: Self-pay

## 2019-07-09 ENCOUNTER — Ambulatory Visit (INDEPENDENT_AMBULATORY_CARE_PROVIDER_SITE_OTHER): Payer: Medicare Other | Admitting: Family Medicine

## 2019-07-09 ENCOUNTER — Encounter: Payer: Self-pay | Admitting: Family Medicine

## 2019-07-09 VITALS — BP 128/89 | HR 94 | Temp 98.1°F | Ht 60.0 in | Wt 201.6 lb

## 2019-07-09 DIAGNOSIS — E1159 Type 2 diabetes mellitus with other circulatory complications: Secondary | ICD-10-CM

## 2019-07-09 DIAGNOSIS — K754 Autoimmune hepatitis: Secondary | ICD-10-CM | POA: Diagnosis not present

## 2019-07-09 DIAGNOSIS — F4312 Post-traumatic stress disorder, chronic: Secondary | ICD-10-CM

## 2019-07-09 DIAGNOSIS — M5136 Other intervertebral disc degeneration, lumbar region: Secondary | ICD-10-CM

## 2019-07-09 DIAGNOSIS — E039 Hypothyroidism, unspecified: Secondary | ICD-10-CM

## 2019-07-09 DIAGNOSIS — M51369 Other intervertebral disc degeneration, lumbar region without mention of lumbar back pain or lower extremity pain: Secondary | ICD-10-CM

## 2019-07-09 DIAGNOSIS — K219 Gastro-esophageal reflux disease without esophagitis: Secondary | ICD-10-CM

## 2019-07-09 DIAGNOSIS — F4310 Post-traumatic stress disorder, unspecified: Secondary | ICD-10-CM

## 2019-07-09 DIAGNOSIS — I10 Essential (primary) hypertension: Secondary | ICD-10-CM

## 2019-07-09 DIAGNOSIS — F515 Nightmare disorder: Secondary | ICD-10-CM

## 2019-07-09 NOTE — Patient Instructions (Signed)
Increase metformin 500mg  (2) at night and (1) in the morning Take glucose reading-fasting and after largest

## 2019-07-09 NOTE — Progress Notes (Signed)
New Patient Office Visit  Subjective:  Patient ID: Lindsay Walsh, female    DOB: 04-Sep-1970  Age: 48 y.o. MRN: [id number]  CC:  Chief Complaint  Patient presents with  . Establish Care  . GI Problem    burping, emesis, lasts 3 days when it happens once a week for last 2 weeks   gi appt next week  HPI [name] presents for nausea and vomiting over the past few years-seen at [facility] for evaluation-appt GI next week-EGD completed 04/25/18 normal. Pt states she takes Nexium daily and phenergan prn.  Pt being treated for autoimmune hepatitis -Imuran given by [name]. Pt evaluated at [facility]. Given Lasix as needed for liver failure- q OD-DR [name]  HTN-lisinopril-started after stroke for control Hyperlipidemia-atorvastatin 10/20-lipid panel-started CVA -no concerns DM-diagnosed-metformin BID-A1c 7.2%-fasting 170 , after eating-not currently checking -sees eye specialist CVA-asa-pt no longer smoking [name] surgery recommended-bilat-took prednisone in the past-REstasis /Lumigan Hypothyroid-levothyroxine 23mcg Neuro-CPAP-sleep study -neuro. Cardio-metoprolol/hydralzine/ RLS-pramipexole prn- Depression-cymbalta Skin lesions caused by bacterial-clindamycin-worse under breast bilat Psy -Adderall for concentration/fatigue  Past Medical History:  Diagnosis Date  . Allergic rhinitis due to pollen   . Angina    took SL nitro one week ago  . Arthritis    back  . Autoimmune hepatitis ([place])   . Back pain   . Chest pain    Normal stress echo in 2011; PVCs; pedal edema  . CVA (cerebral infarction)   . Depression   . Diabetes mellitus without complication ([place])   . Dizziness and giddiness   . Dyspareunia 05/06/2014  . Dysrhythmia    palpatations  . Essential (primary) hypertension   . Fasting hyperglycemia   . Fibromyalgia   . Fibromyalgia   . Gastroesophageal reflux disease    Chronic abdominal pain; gastroparesis; globus hystericus; irritable bowel  syndrome  . Hereditary and idiopathic neuropathy, unspecified   . Hyperlipidemia   . Hypertension   . Impaired glucose tolerance   . Irritable bowel syndrome   . Major depressive disorder, single episode, unspecified   . Morbid (severe) obesity due to excess calories ([place])   . Narcotic dependence ([place])   . Nausea   . Nonspecific mesenteric lymphadenitis   . Overweight(278.02)   . Pain in limb   . Pain in right foot   . PTSD (post-traumatic stress disorder)   . Pulmonary nodule   . Restless legs syndrome   . Shortness of breath    with exertion  . Sleep apnea   . Sleep apnea    on CPAP machine  . Sleep apnea, unspecified   . Tobacco abuse    one pack per day; 35 pack years  . Type 2 diabetes mellitus with hyperglycemia [facility])     Past Surgical History:  Procedure Laterality Date  . ABDOMINAL HYSTERECTOMY    . BACK SURGERY    . BLADDER SUSPENSION  09/12/2011   Procedure: TRANSVAGINAL TAPE (TVT) PROCEDURE;  Surgeon: [name], MD;  Location: [facility];  Service: Urology;  Laterality: N/A;  . CESAREAN SECTION     X3  . CHOLECYSTECTOMY    . ESOPHAGOGASTRODUODENOSCOPY ENDOSCOPY     "throat stretched" per pt.  Marland Kitchen LIVER BIOPSY     x4  . LUMBAR EPIDURAL INJECTION  01/2012  . TOTAL ABDOMINAL HYSTERECTOMY W/ BILATERAL SALPINGOOPHORECTOMY    . TUBAL LIGATION     Bilateral  . UPPER GASTROINTESTINAL ENDOSCOPY  09/13/2010    Family History  Problem Relation Age of  Onset  . Diabetes Mother   . Hypertension Mother   . Anxiety disorder Mother   . Depression Mother   . Drug abuse Mother   . Heart disease Father   . Diabetes Father   . Anxiety disorder Father   . Alcohol abuse Father   . Depression Father   . OCD Father   . Thyroid disease Sister   . Healthy Brother   . Healthy Daughter   . Healthy Son   . ADD / ADHD Son   . Alcohol abuse Paternal Grandfather   . Depression Paternal Grandfather   . Cancer Paternal Grandfather        lung,skin  . Tuberculosis  Paternal Grandfather   . Seizures Paternal Grandmother   . Lupus Paternal Grandmother   . ADD / ADHD Son   . Anesthesia problems Neg Hx   . Malignant hyperthermia Neg Hx   . Pseudochol deficiency Neg Hx   . Hypotension Neg Hx   . Bipolar disorder Neg Hx   . Dementia Neg Hx   . Paranoid behavior Neg Hx   . Schizophrenia Neg Hx   . Sexual abuse Neg Hx   . Physical abuse Neg Hx     Social History   Socioeconomic History  . Marital status: Divorced    Spouse name: Not on file  . Number of children: Not on file  . Years of education: GED  . Highest education level: Not on file  Occupational History    Employer: NOT EMPLOYED  Tobacco Use  . Smoking status: Former Smoker    Packs/day: 1.50    Years: 25.00    Pack years: 37.50    Types: Cigarettes    Quit date: 04/25/2013    Years since quitting: 6.2  . Smokeless tobacco: Never Used  . Tobacco comment: 15-20 cigs a day as of 11/12/2012  Substance and Sexual Activity  . Alcohol use: No    Alcohol/week: 0.0 standard drinks  . Drug use: No  . Sexual activity: Yes    Birth control/protection: Surgical  Other Topics Concern  . Not on file  Social History Narrative  . Not on file   Social Determinants of Health   Financial Resource Strain:   . Difficulty of Paying Living Expenses: Not on file  Food Insecurity:   . Worried About Charity fundraiser in the Last Year: Not on file  . Ran Out of Food in the Last Year: Not on file  Transportation Needs:   . Lack of Transportation (Medical): Not on file  . Lack of Transportation (Non-Medical): Not on file  Physical Activity:   . Days of Exercise per Week: Not on file  . Minutes of Exercise per Session: Not on file  Stress:   . Feeling of Stress : Not on file  Social Connections:   . Frequency of Communication with Friends and Family: Not on file  . Frequency of Social Gatherings with Friends and Family: Not on file  . Attends Religious Services: Not on file  . Active Member  of Clubs or Organizations: Not on file  . Attends Archivist Meetings: Not on file  . Marital Status: Not on file  Intimate Partner Violence:   . Fear of Current or Ex-Partner: Not on file  . Emotionally Abused: Not on file  . Physically Abused: Not on file  . Sexually Abused: Not on file    ROS Review of Systems  Constitutional: Positive for activity change.  HENT: Positive for sinus pressure.   Eyes: Positive for photophobia, pain, itching and visual disturbance.       Glasses at night  Respiratory: Positive for shortness of breath.   Cardiovascular: Positive for palpitations and leg swelling.  Gastrointestinal: Positive for diarrhea and nausea.  Endocrine: Positive for cold intolerance, heat intolerance and polydipsia.  Genitourinary:       UTI  Musculoskeletal: Positive for arthralgias, back pain, joint swelling, myalgias, neck pain and neck stiffness.  Skin: Positive for wound.  Allergic/Immunologic: Positive for environmental allergies.  Neurological: Positive for dizziness, weakness, light-headedness and numbness.  Hematological: Negative.   Psychiatric/Behavioral: Positive for agitation, decreased concentration and sleep disturbance. The patient is nervous/anxious.        Nightmares-Prazosin 2mg  given by psy     Objective:   Today's Vitals: BP 128/89 (BP Location: Left Arm, Patient Position: Sitting, Cuff Size: Normal)   Pulse 94   Temp 98.1 F (36.7 C) (Oral)   Ht 5' (1.524 m)   Wt 201 lb 9.6 oz (91.4 kg)   SpO2 98%   BMI 39.37 kg/m   Physical Exam Constitutional:      Appearance: Normal appearance.  HENT:     Head: Normocephalic and atraumatic.     Nose: Nose normal.  Eyes:     Conjunctiva/sclera: Conjunctivae normal.  Cardiovascular:     Rate and Rhythm: Normal rate and regular rhythm.     Pulses: Normal pulses.     Heart sounds: Normal heart sounds.  Pulmonary:     Breath sounds: Normal breath sounds.  Musculoskeletal:     Cervical back:  Normal range of motion and neck supple.  Neurological:     Mental Status: She is alert and oriented to person, place, and time.  Psychiatric:        Mood and Affect: Mood normal.        Behavior: Behavior normal.     Assessment & Plan:  1. Essential hypertension Well controlled currently-lisinopril-rx, metoprolol-rx, lasix 2. DM type 2 causing vascular disease ([place]) Take 2 metformin at night with food then one in the morning Goal 7.0-currently not well controlled 3. Hypothyroidism, unspecified type TSH normal  4. Hepatitis, autoimmune ([place]) Dr. Marilynn Rail following  5. Gastroesophageal reflux disease without esophagitis Nexium-stable per pt however symptoms of nausea/burping concerning-gi next week  6. Nightmares associated with chronic post-traumatic stress disorder Prozosin-managed by psy  7. Degenerative disc disease, lumbar cymbalta-started for depression and pain management  Outpatient Encounter Medications as of 07/09/2019  Medication Sig  . bimatoprost (LUMIGAN) 0.01 % SOLN Place 1 drop into both eyes at bedtime.  Marland Kitchen CLINDAMYCIN HCL PO Take 60 mLs by mouth daily.  . cycloSPORINE (RESTASIS) 0.05 % ophthalmic emulsion Place 1 drop into both eyes daily.  Marland Kitchen ammonium lactate (AMLACTIN) 12 % cream   . amphetamine-dextroamphetamine (ADDERALL) 20 MG tablet Take 1 tablet (20 mg total) by mouth 2 (two) times daily.  . Ascorbic Acid (VITAMIN C) 1000 MG tablet Take 500 mg by mouth every morning.   Marland Kitchen aspirin 325 MG EC tablet Take 325 mg by mouth daily.  Marland Kitchen atorvastatin (LIPITOR) 80 MG tablet Take 1 tablet by mouth daily.  Marland Kitchen azaTHIOprine (IMURAN) 50 MG tablet Take 1 tablet (50 mg total) by mouth daily.  . calcium-vitamin D (OS-CAL 500 + D) 500-200 MG-UNIT per tablet Take 1 tablet by mouth 2 (two) times daily.   . DULoxetine (CYMBALTA) 60 MG capsule TAKE 1 CAPSULE(60 MG) BY MOUTH TWICE DAILY  .  esomeprazole (NEXIUM) 40 MG capsule TAKE 1 CAPSULE BY MOUTH EVERY MORNING  .  fexofenadine (ALLEGRA) 60 MG tablet Take 1 tablet by mouth 2 (two) times daily as needed.  . furosemide (LASIX) 40 MG tablet Take 40 mg by mouth every other day.   Marland Kitchen GARLIC PO Take [id number] mg by mouth daily.   . hydrALAZINE (APRESOLINE) 100 MG tablet Take 1 tablet by mouth 4 (four) times daily.  Marland Kitchen levothyroxine (SYNTHROID, LEVOTHROID) 25 MCG tablet Take 25 mcg by mouth daily before breakfast.  . lisinopril (PRINIVIL,ZESTRIL) 40 MG tablet Take 40 mg by mouth daily.   . metFORMIN (GLUCOPHAGE) 500 MG tablet Take 500 mg by mouth daily.  . metoprolol succinate (TOPROL-XL) 100 MG 24 hr tablet TAKE 1 TABLET BY MOUTH EVERY DAY WITH FOOD  . Multiple Vitamins-Minerals (CENTRUM) tablet Take 1 tablet by mouth daily.  . ondansetron (ZOFRAN) 4 MG tablet Take 1 tablet (4 mg total) by mouth every 8 (eight) hours as needed for nausea or vomiting.  . pramipexole (MIRAPEX) 1 MG tablet Take 2 tablets by mouth at bedtime.  . prazosin (MINIPRESS) 2 MG capsule TAKE 1 CAPSULE(2 MG) BY MOUTH AT BEDTIME  . PROAIR HFA 108 (90 BASE) MCG/ACT inhaler Inhale 1-2 puffs into the lungs every 6 (six) hours as needed for wheezing or shortness of breath.   . Probiotic Product (RESTORA PO) Take by mouth daily.   . promethazine (PHENERGAN) 25 MG tablet Take 1 tablet (25 mg total) by mouth every 6 (six) hours as needed for nausea or vomiting. May cause drowsiness  . [DISCONTINUED] ciprofloxacin (CIPRO) 500 MG tablet Take 1 tablet (500 mg total) by mouth 2 (two) times daily. (Patient not taking: Reported on 07/06/2019)  . [DISCONTINUED] dicyclomine (BENTYL) 10 MG capsule Take 1 capsule (10 mg total) by mouth daily as needed. For cramps (Patient not taking: Reported on 07/09/2019)  . [DISCONTINUED] isosorbide mononitrate (IMDUR) 30 MG 24 hr tablet TAKE ONE (1) TABLET EACH DAY (Patient not taking: Reported on 07/09/2019)  . [DISCONTINUED] loperamide (IMODIUM) 2 MG capsule Take 1 capsule (2 mg total) by mouth 4 (four) times daily as needed for  diarrhea or loose stools. (Patient not taking: Reported on 07/09/2019)  . [DISCONTINUED] sulfamethoxazole-trimethoprim (BACTRIM DS) 800-160 MG tablet Take 1 tablet by mouth 2 (two) times daily for 7 days. (Patient not taking: Reported on 07/09/2019)   No facility-administered encounter medications on file as of 07/09/2019.    Follow-up:   Benny Henrie Hannah Beat, MD

## 2019-07-15 ENCOUNTER — Other Ambulatory Visit: Payer: Self-pay

## 2019-07-15 ENCOUNTER — Ambulatory Visit (INDEPENDENT_AMBULATORY_CARE_PROVIDER_SITE_OTHER): Payer: Medicare Other | Admitting: Internal Medicine

## 2019-07-15 ENCOUNTER — Other Ambulatory Visit (INDEPENDENT_AMBULATORY_CARE_PROVIDER_SITE_OTHER): Payer: Self-pay | Admitting: *Deleted

## 2019-07-15 ENCOUNTER — Encounter (INDEPENDENT_AMBULATORY_CARE_PROVIDER_SITE_OTHER): Payer: Self-pay | Admitting: Internal Medicine

## 2019-07-15 VITALS — BP 139/97 | HR 97 | Temp 97.2°F | Ht 60.0 in | Wt 195.3 lb

## 2019-07-15 DIAGNOSIS — R8290 Unspecified abnormal findings in urine: Secondary | ICD-10-CM

## 2019-07-15 DIAGNOSIS — R197 Diarrhea, unspecified: Secondary | ICD-10-CM | POA: Insufficient documentation

## 2019-07-15 DIAGNOSIS — K754 Autoimmune hepatitis: Secondary | ICD-10-CM

## 2019-07-15 DIAGNOSIS — R1011 Right upper quadrant pain: Secondary | ICD-10-CM

## 2019-07-15 DIAGNOSIS — R112 Nausea with vomiting, unspecified: Secondary | ICD-10-CM

## 2019-07-15 DIAGNOSIS — K219 Gastro-esophageal reflux disease without esophagitis: Secondary | ICD-10-CM

## 2019-07-15 NOTE — Patient Instructions (Signed)
Keep daily symptom diary as discussed. When you feel exhausted, please check your heart rate blood pressure temperature and blood glucose level. Physician will call with results of gastric emptying study when completed.

## 2019-07-15 NOTE — Progress Notes (Signed)
Presenting complaint;  Episodic nausea vomiting and diarrhea. Recurrent UTI.  Database and subjective:  Patient is 49 year old Caucasian female who is here for scheduled visit.  She was last seen in our office in April 2019. She has multiple medical problems which include autoimmune hepatitis,  chronic GERD, irritable bowel syndrome, diabetes mellitus, history of CVA, obesity fibromyalgia peripheral neuropathy PTSD hypertension and depression. Autoimmune hepatitis was diagnosed by liver biopsy by me in 2004.  She also had liver biopsy at the time of laparoscopic cholecystectomy in December 2010 at [facility] and she was noted to have stage I and II disease.  She has been maintained on low-dose azathioprine.  She was also seeing Dr. Lockie Mola until he  left [facility] and now she sees Dr. Laurier Nancy.  Patient now presents with 2-year history of intermittent nausea vomiting and diarrhea.  She states symptoms would occur every 2 months for a while and then became more frequent.  Then she started to have these spells every month and then every 2 weeks and now she has had 2 spells over the last 2 weeks.  Between the spells she feels fine.  She has a history of IBS and may have intermittent diarrhea and urgency as well as normal stools. Each of these spells starts with feeling of exhaustion which may last for 1 day.  On days she starts burping.  Burps smell like sulfur or rotten eggs.  Burping is not followed by nausea and vomiting and she vomits food and clear liquid or bile.  No history of hematemesis.  She may have multiple episodes over a couple of hours and during this time she begins to have diarrhea with loose stools.  Once again she does not pass melena or rectal bleeding.  She says when this occurs she has to use diapers as she has urgency and no control.  Each spell lasts for about 3 days.  She is not sure what triggers these spells.  She did have esophagogastroduodenoscopy as well as colonoscopy at [facility] in  October 2019.  Esophagogastroduodenoscopy was normal.  Terminal ileum was also normal.  She did not have endoscopic or microscopic colitis.  She had 5 mm tubular adenoma removed. No recommendations were made and she was advised to follow-up with me. She states when she has these spells she feels very weak and she has passed out on few occasions.  She has been evaluated in emergency room and discharged. Following her April 2019 visit to our office she had abdominal pelvic CT and no abnormality was noted to account for the symptoms.  Patient states every time she comes emergency room she is told that she has urinary tract infection.  Her last visit was on 07/06/2019 when she had more than 50 WBCs and she was given 1 g of ceftriaxone and discharged on Bactrim DS.  She will finish antibiotic tomorrow.  She says she did not have any symptoms of dysuria or hematuria.  Similarly in the past she is not sure if she has had dysuria.  She has lost 11 pounds since she was last seen in April 2019.  This weight loss appears to be involuntary. Patient does not smoke cigarettes or drink alcohol.    Current Medications: Outpatient Encounter Medications as of 07/15/2019  Medication Sig  . ammonium lactate (AMLACTIN) 12 % cream Apply topically as needed.   Marland Kitchen amphetamine-dextroamphetamine (ADDERALL) 20 MG tablet Take 1 tablet (20 mg total) by mouth 2 (two) times daily.  . Ascorbic Acid (  VITAMIN C) 1000 MG tablet Take 500 mg by mouth every morning.   Marland Kitchen aspirin 325 MG EC tablet Take 325 mg by mouth daily.  Marland Kitchen atorvastatin (LIPITOR) 80 MG tablet Take 1 tablet by mouth daily.  Marland Kitchen azaTHIOprine (IMURAN) 50 MG tablet Take 1 tablet (50 mg total) by mouth daily.  . bimatoprost (LUMIGAN) 0.01 % SOLN Place 1 drop into both eyes at bedtime.  . calcium-vitamin D (OS-CAL 500 + D) 500-200 MG-UNIT per tablet Take 1 tablet by mouth 2 (two) times daily.   Marland Kitchen CLINDAMYCIN HCL PO Take 60 mLs by mouth daily.  . cycloSPORINE (RESTASIS) 0.05  % ophthalmic emulsion Place 1 drop into both eyes daily.  . DULoxetine (CYMBALTA) 60 MG capsule TAKE 1 CAPSULE(60 MG) BY MOUTH TWICE DAILY  . esomeprazole (NEXIUM) 40 MG capsule TAKE 1 CAPSULE BY MOUTH EVERY MORNING  . fexofenadine (ALLEGRA) 60 MG tablet Take 1 tablet by mouth 2 (two) times daily as needed.  . furosemide (LASIX) 40 MG tablet Take 40 mg by mouth every other day.   Marland Kitchen GARLIC PO Take 2,353 mg by mouth daily.   . hydrALAZINE (APRESOLINE) 100 MG tablet Take 1 tablet by mouth 4 (four) times daily.  Marland Kitchen levothyroxine (SYNTHROID, LEVOTHROID) 25 MCG tablet Take 25 mcg by mouth daily before breakfast.  . lisinopril (PRINIVIL,ZESTRIL) 40 MG tablet Take 40 mg by mouth daily.   . metFORMIN (GLUCOPHAGE) 500 MG tablet Take 500 mg by mouth 2 (two) times daily with a meal.   . metoprolol succinate (TOPROL-XL) 100 MG 24 hr tablet TAKE 1 TABLET BY MOUTH EVERY DAY WITH FOOD  . Multiple Vitamins-Minerals (CENTRUM) tablet Take 1 tablet by mouth daily.  . ondansetron (ZOFRAN) 4 MG tablet Take 1 tablet (4 mg total) by mouth every 8 (eight) hours as needed for nausea or vomiting.  . pramipexole (MIRAPEX) 1 MG tablet Take 2 tablets by mouth at bedtime.  . prazosin (MINIPRESS) 2 MG capsule TAKE 1 CAPSULE(2 MG) BY MOUTH AT BEDTIME  . PROAIR HFA 108 (90 BASE) MCG/ACT inhaler Inhale 1-2 puffs into the lungs every 6 (six) hours as needed for wheezing or shortness of breath.   . Probiotic Product (RESTORA PO) Take by mouth daily.   . promethazine (PHENERGAN) 25 MG tablet Take 1 tablet (25 mg total) by mouth every 6 (six) hours as needed for nausea or vomiting. May cause drowsiness   No facility-administered encounter medications on file as of 07/15/2019.   Past Medical History:  Diagnosis Date  . Allergic rhinitis due to pollen   . Angina    took SL nitro one week ago  . Arthritis    back  . Autoimmune hepatitis ([place])   . Back pain   . Chest pain    Normal stress echo in 2011; PVCs; pedal edema  . CVA  (cerebral infarction)   . Depression   . Diabetes mellitus without complication ([place])   . Dizziness and giddiness   . Dyspareunia 05/06/2014  . Dysrhythmia    palpatations  . Essential (primary) hypertension   . Fasting hyperglycemia   . Fibromyalgia   . Fibromyalgia   . Gastroesophageal reflux disease    Chronic abdominal pain; gastroparesis; globus hystericus; irritable bowel syndrome  . Hereditary and idiopathic neuropathy, unspecified   . Hyperlipidemia   . Hypertension   . Impaired glucose tolerance   . Irritable bowel syndrome   . Major depressive disorder, single episode, unspecified   . Morbid (severe) obesity due to excess  calories ([place])   . Narcotic dependence ([place])   . Nausea   . Nonspecific mesenteric lymphadenitis   . Overweight(278.02)   . Pain in limb   . Pain in right foot   . PTSD (post-traumatic stress disorder)   . Pulmonary nodule   . Restless legs syndrome   . Shortness of breath    with exertion  . Sleep apnea   . Sleep apnea    on CPAP machine  . Sleep apnea, unspecified   . Tobacco abuse    one pack per day; 35 pack years  . Type 2 diabetes mellitus with hyperglycemia [facility])    Past Surgical History:  Procedure Laterality Date  . ABDOMINAL HYSTERECTOMY    . BACK SURGERY    . BLADDER SUSPENSION  09/12/2011   Procedure: TRANSVAGINAL TAPE (TVT) PROCEDURE;  Surgeon: [name], MD;  Location: [facility];  Service: Urology;  Laterality: N/A;  . CESAREAN SECTION     X3  . CHOLECYSTECTOMY    . ESOPHAGOGASTRODUODENOSCOPY ENDOSCOPY     "throat stretched" per pt.  Marland Kitchen LIVER BIOPSY     x4  . LUMBAR EPIDURAL INJECTION  01/2012  . TOTAL ABDOMINAL HYSTERECTOMY W/ BILATERAL SALPINGOOPHORECTOMY    . TUBAL LIGATION     Bilateral  . UPPER GASTROINTESTINAL ENDOSCOPY  09/13/2010   Allergies  Allergen Reactions  . Doxycycline Shortness Of Breath and Rash  . Fentanyl Shortness Of Breath, Itching and Other (See Comments)    Heart racing, SOB  . Ceftin  [Cefuroxime Axetil]   . Iohexol Other (See Comments)    Knots on body   . Ivp Dye [Iodinated Diagnostic Agents] Other (See Comments)    Knots on body  . Norvasc [Amlodipine Besylate]   . Wellbutrin [Bupropion Hcl] Other (See Comments)    unknown  . Latex Rash  . Tape Rash    Objective: Blood pressure (!) 139/97, pulse 97, temperature (!) 97.2 F (36.2 C), temperature source Oral, height 5' (1.524 m), weight 195 lb 4.8 oz (88.6 kg). Patient is alert and in no acute distress. She does not have asterixis. She is wearing a facial mask. Conjunctiva is pink. Sclera is nonicteric Oropharyngeal mucosa is normal. No neck masses or thyromegaly noted. Cardiac exam with regular rhythm normal S1 and S2. No murmur or gallop noted. Lungs are clear to auscultation. Abdomen is full.  Bowel sounds are normal.  On palpation abdomen is soft and nontender with organomegaly or masses. No LE edema or clubbing noted.  Labs/studies Results:  CBC Latest Ref Rng & Units 07/06/2019 04/25/2019 04/05/2019  WBC 4.0 - 10.5 K/uL 12.7(H) 11.8 21.8(H)  Hemoglobin 12.0 - 15.0 g/dL 12.8 13.5 14.6  Hematocrit 36.0 - 46.0 % 37.8 40 44.9  Platelets 150 - 400 K/uL 258 346 312    CMP Latest Ref Rng & Units 07/06/2019 04/25/2019 04/05/2019  Glucose 70 - 99 mg/dL 201(H) - 214(H)  BUN 6 - 20 mg/dL 14 10 17   Creatinine 0.44 - 1.00 mg/dL 0.58 0.7 0.61  Sodium 135 - 145 mmol/L 137 139 139  Potassium 3.5 - 5.1 mmol/L 4.0 4.3 4.1  Chloride 98 - 111 mmol/L 102 97(A) 104  CO2 22 - 32 mmol/L 26 28(A) 25  Calcium 8.9 - 10.3 mg/dL 8.6(L) 9.8 9.1  Total Protein 6.5 - 8.1 g/dL 7.2 - 8.2(H)  Total Bilirubin 0.3 - 1.2 mg/dL 0.7 - 0.9  Alkaline Phos 38 - 126 U/L 95 145(A) 131(H)  AST 15 - 41 U/L 20  18 23  ALT 0 - 44 U/L 21 21 25     Hepatic Function Latest Ref Rng & Units 07/06/2019 04/25/2019 04/05/2019  Total Protein 6.5 - 8.1 g/dL 7.2 - 8.2(H)  Albumin 3.5 - 5.0 g/dL 3.7 4.0 4.2  AST 15 - 41 U/L 20 18 23   ALT 0 - 44 U/L 21  21 25   Alk Phosphatase 38 - 126 U/L 95 145(A) 131(H)  Total Bilirubin 0.3 - 1.2 mg/dL 0.7 - 0.9  Bilirubin, Direct <=0.2 mg/dL - - -    Urinalysis pertinent for cloudy urine with greater than 50 WBCs few bacteria but negative for ketones or protein.  She also had 11-20 squamous/epithelial cells per high-power field. Urine culture negative.  Assessment:  #1.  Episodic nausea vomiting and diarrhea.  She has been having these spells for 2 years.  Spells occurred once every 2 months and now occurring every week or every other week.  CT in May 2019 was unremarkable.  She had EGD and colonoscopy at [facility] in October 2019 without significant findings.  This symptom complex may be related to diabetes mellitus or medications and she could also have gastroparesis.  She does not have any other symptoms just carcinoid.  We may also have to examine her small bowel unless she is confirmed to have gastroparesis.  Pain close attention to her symptoms and keeping a diary may help sort this out.  #2.  History of autoimmune hepatitis.  She remains in biochemical remission.  She is on low-dose azathioprine.  Her prescription was filled by me in October 2020 with 5 refills.  I stressed the patient that she should have blood work every 3 to 4 months as long as she stays on this medication.  #3.  GERD.  She is doing well with therapy.  #4.  IBS.  She is having intermittent diarrhea but this does not appear to be intractable.  Therefore will hold off antispasmodic.  #5.  Abnormal urinalysis.  Been told on multiple occasions that she has urinary tract infection but remains to be seen if she has more than 1 reason for abnormal urinalysis.  Need to rule out other causes of leukocyturia   Plan:  Medication list updated. Patient advised to keep daily symptom diary for the next 4 weeks or so.  Patient advised to check her pulse blood pressure temperature and blood glucose level when her symptoms start. Patient advised to  have urinalysis with microscopy when she finishes antibiotic. Will schedule patient for solid-phase gastric emptying study. Setting 1 month.

## 2019-07-17 DIAGNOSIS — R8290 Unspecified abnormal findings in urine: Secondary | ICD-10-CM | POA: Diagnosis not present

## 2019-07-18 LAB — URINALYSIS, ROUTINE W REFLEX MICROSCOPIC
Bilirubin, UA: NEGATIVE
Glucose, UA: NEGATIVE
Ketones, UA: NEGATIVE
Nitrite, UA: NEGATIVE
Protein,UA: NEGATIVE
RBC, UA: NEGATIVE
Specific Gravity, UA: 1.016 (ref 1.005–1.030)
Urobilinogen, Ur: 0.2 mg/dL (ref 0.2–1.0)
pH, UA: 6.5 (ref 5.0–7.5)

## 2019-07-18 LAB — MICROSCOPIC EXAMINATION: Casts: NONE SEEN /lpf

## 2019-07-21 ENCOUNTER — Other Ambulatory Visit: Payer: Self-pay

## 2019-07-21 ENCOUNTER — Encounter (HOSPITAL_COMMUNITY): Payer: Self-pay

## 2019-07-21 ENCOUNTER — Telehealth: Payer: Self-pay | Admitting: Family Medicine

## 2019-07-21 ENCOUNTER — Ambulatory Visit (HOSPITAL_COMMUNITY)
Admission: RE | Admit: 2019-07-21 | Discharge: 2019-07-21 | Disposition: A | Discharge status: Home / Self Care | Payer: Medicare Other | Source: Ambulatory Visit | Attending: Internal Medicine | Admitting: Internal Medicine

## 2019-07-21 DIAGNOSIS — R1011 Right upper quadrant pain: Secondary | ICD-10-CM | POA: Diagnosis not present

## 2019-07-21 DIAGNOSIS — K30 Functional dyspepsia: Secondary | ICD-10-CM | POA: Diagnosis not present

## 2019-07-21 DIAGNOSIS — R1010 Upper abdominal pain, unspecified: Secondary | ICD-10-CM | POA: Diagnosis not present

## 2019-07-21 MED ORDER — TECHNETIUM TC 99M SULFUR COLLOID
2.0000 | Freq: Once | INTRAVENOUS | Status: AC
  Administered 2019-07-21: 2.1 via ORAL

## 2019-07-21 NOTE — Telephone Encounter (Signed)
Routing to Dr. Corum 

## 2019-07-21 NOTE — Telephone Encounter (Signed)
Please call pt to find out concerns that need to be addressed

## 2019-07-21 NOTE — Telephone Encounter (Signed)
Patient called and states [name] called her about her lab results and is wanting her to see a urologist and states she needed to speak with Dr. Holly Bodily.

## 2019-07-22 ENCOUNTER — Other Ambulatory Visit: Payer: Self-pay | Admitting: Family Medicine

## 2019-07-22 DIAGNOSIS — E1159 Type 2 diabetes mellitus with other circulatory complications: Secondary | ICD-10-CM

## 2019-07-22 DIAGNOSIS — R339 Retention of urine, unspecified: Secondary | ICD-10-CM

## 2019-07-22 DIAGNOSIS — G629 Polyneuropathy, unspecified: Secondary | ICD-10-CM

## 2019-07-22 DIAGNOSIS — G4730 Sleep apnea, unspecified: Secondary | ICD-10-CM

## 2019-07-22 NOTE — Telephone Encounter (Signed)
Patient is aware 

## 2019-07-22 NOTE — Telephone Encounter (Signed)
A urine specimen was taken and stated that she is having frequent UTI's and the cell count in her urine is high and that her bladder is not emptying all the way, She is asking to be sent to [facility], she is also asking for a new referral to be sent to Dr Merlene Laughter she sees him for sleep apnea and neuropathy and she has an appointment with him on 08/04/2019 and they need a new referral from Dr. Holly Bodily

## 2019-07-22 NOTE — Telephone Encounter (Signed)
Referrals completed

## 2019-07-31 ENCOUNTER — Telehealth (INDEPENDENT_AMBULATORY_CARE_PROVIDER_SITE_OTHER): Payer: Self-pay | Admitting: Internal Medicine

## 2019-07-31 NOTE — Telephone Encounter (Signed)
Patient left message stating she received a diet in the mail and she doesn't understand it - please advise - [phone]

## 2019-08-04 ENCOUNTER — Other Ambulatory Visit (HOSPITAL_COMMUNITY): Payer: Self-pay | Admitting: Neurology

## 2019-08-04 DIAGNOSIS — M542 Cervicalgia: Secondary | ICD-10-CM | POA: Diagnosis not present

## 2019-08-04 DIAGNOSIS — G894 Chronic pain syndrome: Secondary | ICD-10-CM | POA: Diagnosis not present

## 2019-08-04 DIAGNOSIS — M545 Low back pain, unspecified: Secondary | ICD-10-CM

## 2019-08-04 DIAGNOSIS — M797 Fibromyalgia: Secondary | ICD-10-CM | POA: Diagnosis not present

## 2019-08-04 DIAGNOSIS — G4733 Obstructive sleep apnea (adult) (pediatric): Secondary | ICD-10-CM | POA: Diagnosis not present

## 2019-08-04 NOTE — Telephone Encounter (Signed)
Patient may choose food from the list that area recommended on the list , and avoid the foods,ect that are not allowed. There should be a list with this information on it. May I ask that you please share this with her.

## 2019-08-08 ENCOUNTER — Ambulatory Visit (HOSPITAL_COMMUNITY)
Admission: RE | Admit: 2019-08-08 | Discharge: 2019-08-08 | Disposition: A | Discharge status: Home / Self Care | Payer: Medicare Other | Source: Ambulatory Visit | Attending: Neurology | Admitting: Neurology

## 2019-08-08 ENCOUNTER — Other Ambulatory Visit: Payer: Self-pay

## 2019-08-08 DIAGNOSIS — M545 Low back pain, unspecified: Secondary | ICD-10-CM

## 2019-08-08 DIAGNOSIS — M542 Cervicalgia: Secondary | ICD-10-CM | POA: Insufficient documentation

## 2019-08-13 ENCOUNTER — Encounter (INDEPENDENT_AMBULATORY_CARE_PROVIDER_SITE_OTHER): Payer: Self-pay

## 2019-08-21 ENCOUNTER — Other Ambulatory Visit: Payer: Self-pay

## 2019-08-21 ENCOUNTER — Ambulatory Visit (INDEPENDENT_AMBULATORY_CARE_PROVIDER_SITE_OTHER): Payer: Medicare Other | Admitting: Internal Medicine

## 2019-08-21 ENCOUNTER — Encounter (INDEPENDENT_AMBULATORY_CARE_PROVIDER_SITE_OTHER): Payer: Self-pay | Admitting: Internal Medicine

## 2019-08-21 DIAGNOSIS — K3184 Gastroparesis: Secondary | ICD-10-CM | POA: Diagnosis not present

## 2019-08-21 DIAGNOSIS — E1143 Type 2 diabetes mellitus with diabetic autonomic (poly)neuropathy: Secondary | ICD-10-CM | POA: Insufficient documentation

## 2019-08-21 MED ORDER — BETHANECHOL CHLORIDE 10 MG PO TABS: 10.0000 mg | ORAL_TABLET | Freq: Three times a day (TID) | ORAL | 2 refills | Status: DC

## 2019-08-21 NOTE — Progress Notes (Signed)
Presenting complaint;  Follow-up for nausea vomiting and diarrhea.  Database and subjective:  Patient is 49 year old Caucasian female who has a history of autoimmune hepatitis and remains in remission on low-dose azathioprine who was seen about 5 weeks ago for episodic nausea vomiting followed by diarrhea.  She had EGD and colonoscopy at [facility] in October 2020.  EGD was normal.  Colonoscopy revealed normal terminal ileum and she had 5 mm tubular adenoma removed.  Following the last visit she had solid-phase gastric emptying study which was abnormal.  Patient was advised trial with gastroparesis diet.  She was sent printed instructions.  Patient returns for follow-up.  She states she has had 2 episodes since her last visit.  She is kept a diary.  She is every time she gets sick her dog stays very close to her.  She continues to experience foul-smelling burps.  Diarrhea only occurs after she has had nausea and vomiting.  She also complains of pain across her upper abdomen because she has been heaving and retching.  She has not lost any weight.  She in fact is gained 4 pounds.  She has not experienced melena or rectal bleeding.  Current Medications: Outpatient Encounter Medications as of 08/21/2019  Medication Sig  . amphetamine-dextroamphetamine (ADDERALL) 20 MG tablet Take 1 tablet (20 mg total) by mouth 2 (two) times daily.  . Ascorbic Acid (VITAMIN C) 1000 MG tablet Take 500 mg by mouth every morning.   Marland Kitchen aspirin 325 MG EC tablet Take 325 mg by mouth daily.  Marland Kitchen atorvastatin (LIPITOR) 80 MG tablet Take 1 tablet by mouth daily.  Marland Kitchen azaTHIOprine (IMURAN) 50 MG tablet Take 1 tablet (50 mg total) by mouth daily.  . bimatoprost (LUMIGAN) 0.01 % SOLN Place 1 drop into both eyes at bedtime.  . calcium-vitamin D (OS-CAL 500 + D) 500-200 MG-UNIT per tablet Take 1 tablet by mouth 2 (two) times daily.   . cycloSPORINE (RESTASIS) 0.05 % ophthalmic emulsion Place 1 drop into both eyes daily.  . DULoxetine  (CYMBALTA) 60 MG capsule TAKE 1 CAPSULE(60 MG) BY MOUTH TWICE DAILY  . esomeprazole (NEXIUM) 40 MG capsule TAKE 1 CAPSULE BY MOUTH EVERY MORNING  . fexofenadine (ALLEGRA) 60 MG tablet Take 1 tablet by mouth 2 (two) times daily as needed.  . furosemide (LASIX) 40 MG tablet Take 40 mg by mouth every other day.   . gabapentin (NEURONTIN) 300 MG capsule Take 300 mg by mouth at bedtime.  Marland Kitchen GARLIC PO Take [id number] mg by mouth daily.   . hydrALAZINE (APRESOLINE) 100 MG tablet Take 1 tablet by mouth 4 (four) times daily.  Marland Kitchen levothyroxine (SYNTHROID, LEVOTHROID) 25 MCG tablet Take 25 mcg by mouth daily before breakfast.  . lisinopril (PRINIVIL,ZESTRIL) 40 MG tablet Take 40 mg by mouth daily.   . metFORMIN (GLUCOPHAGE) 500 MG tablet Take 1,000 mg by mouth 2 (two) times daily with a meal.   . metoprolol succinate (TOPROL-XL) 100 MG 24 hr tablet TAKE 1 TABLET BY MOUTH EVERY DAY WITH FOOD  . Multiple Vitamins-Minerals (CENTRUM) tablet Take 1 tablet by mouth daily.  . ondansetron (ZOFRAN) 4 MG tablet Take 1 tablet (4 mg total) by mouth every 8 (eight) hours as needed for nausea or vomiting.  . pramipexole (MIRAPEX) 1 MG tablet Take 2 tablets by mouth at bedtime.  . prazosin (MINIPRESS) 2 MG capsule TAKE 1 CAPSULE(2 MG) BY MOUTH AT BEDTIME  . Probiotic Product (RESTORA PO) Take by mouth daily.   . promethazine (PHENERGAN) 25 MG  tablet Take 1 tablet (25 mg total) by mouth every 6 (six) hours as needed for nausea or vomiting. May cause drowsiness  . ammonium lactate (AMLACTIN) 12 % cream Apply topically as needed.   Marland Kitchen PROAIR HFA 108 (90 BASE) MCG/ACT inhaler Inhale 1-2 puffs into the lungs every 6 (six) hours as needed for wheezing or shortness of breath.    No facility-administered encounter medications on file as of 08/21/2019.     Objective: Blood pressure 111/62, pulse 91, temperature (!) 97.2 F (36.2 C), temperature source Temporal, height 5' (1.524 m), weight 199 lb 6.4 oz (90.4 kg). Patient is alert  and in no acute distress. She is wearing a facial mask. Conjunctiva is pink. Sclera is nonicteric Oropharyngeal mucosa is normal. No neck masses or thyromegaly noted. Cardiac exam with regular rhythm normal S1 and S2. No murmur or gallop noted. Lungs are clear to auscultation. Abdomen is full.  Bowel sounds are normal.  She does not have a succussion splash.  On palpation she has mild midepigastric tenderness.  No organomegaly or masses. No LE edema or clubbing noted.  Labs/studies Results:  Gastric emptying study was done on 07/21/2019 17.3% emptied in first hour 38.1% emptying in 2 hours 59% emptied in 3 hours 76% emptied in 4 hours.   Assessment:  #1.  Gastroparesis most likely secondary to diabetes mellitus and her medication may be contributing.  Dietary measures have not helped.  Dietary manipulation is somewhat difficult because she is diabetic.  She would benefit from seeing a dietitian.  Unfortunately she is not a candidate for metoclopramide.  We will try her on bethanechol and if it does not work [place]  Plan:  Dietary consultation as soon as possible to help patient get help regarding gastroparesis/diabetic diet. Bethanechol 10 mg by mouth 30 minutes before each meal.  Patient advised to call office if she has any side effects and she can also stop this medication.  If 10 mg dose does not help and she has no side effects will increase dose to 15 mg 3 times a day. Office visit in 2 months.

## 2019-08-21 NOTE — Patient Instructions (Signed)
Please stop bethanechol if you experience any side effects and call office. If you do not experience any benefit with bethanechol increased dose to 15 mg before each meal after 10 days. Dietary consultation

## 2019-08-26 ENCOUNTER — Telehealth (INDEPENDENT_AMBULATORY_CARE_PROVIDER_SITE_OTHER): Payer: Self-pay | Admitting: Internal Medicine

## 2019-08-26 NOTE — Telephone Encounter (Signed)
Patient called stated she had been put on a different medication - wanted to let Dr Laural Golden know that she is still having stomach cramps - please advise - [phone]

## 2019-08-27 NOTE — Telephone Encounter (Signed)
Patient states that the Bethanechol 10 mg started causing her to have stomach cramps on day 2. Dr.[name] ask that the patient take 1/2 the dose(5 mg) three times daily  before meals. Patient will try this ans contact office if she has any other problems.

## 2019-09-03 ENCOUNTER — Ambulatory Visit: Payer: Medicare Other | Admitting: Urology

## 2019-09-16 ENCOUNTER — Ambulatory Visit (INDEPENDENT_AMBULATORY_CARE_PROVIDER_SITE_OTHER): Payer: Medicare Other | Admitting: Psychiatry

## 2019-09-16 ENCOUNTER — Encounter (HOSPITAL_COMMUNITY): Payer: Self-pay | Admitting: Psychiatry

## 2019-09-16 ENCOUNTER — Other Ambulatory Visit: Payer: Self-pay

## 2019-09-16 DIAGNOSIS — F900 Attention-deficit hyperactivity disorder, predominantly inattentive type: Secondary | ICD-10-CM | POA: Diagnosis not present

## 2019-09-16 DIAGNOSIS — F4310 Post-traumatic stress disorder, unspecified: Secondary | ICD-10-CM

## 2019-09-16 DIAGNOSIS — F321 Major depressive disorder, single episode, moderate: Secondary | ICD-10-CM

## 2019-09-16 DIAGNOSIS — F515 Nightmare disorder: Secondary | ICD-10-CM

## 2019-09-16 MED ORDER — PRAZOSIN HCL 2 MG PO CAPS: ORAL_CAPSULE | ORAL | 2 refills | Status: DC

## 2019-09-16 MED ORDER — AMPHETAMINE-DEXTROAMPHETAMINE 20 MG PO TABS: 20.0000 mg | ORAL_TABLET | Freq: Two times a day (BID) | ORAL | 0 refills | Status: DC

## 2019-09-16 MED ORDER — DULOXETINE HCL 60 MG PO CPEP: ORAL_CAPSULE | ORAL | 2 refills | Status: DC
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
## 2019-09-19 DIAGNOSIS — H2513 Age-related nuclear cataract, bilateral: Secondary | ICD-10-CM | POA: Diagnosis not present

## 2019-09-19 DIAGNOSIS — H21233 Degeneration of iris (pigmentary), bilateral: Secondary | ICD-10-CM | POA: Diagnosis not present

## 2019-09-19 DIAGNOSIS — H401131 Primary open-angle glaucoma, bilateral, mild stage: Secondary | ICD-10-CM | POA: Diagnosis not present

## 2019-09-19 DIAGNOSIS — H16223 Keratoconjunctivitis sicca, not specified as Sjogren's, bilateral: Secondary | ICD-10-CM | POA: Diagnosis not present

## 2019-10-01 ENCOUNTER — Encounter: Payer: Self-pay | Admitting: Urology

## 2019-10-01 ENCOUNTER — Other Ambulatory Visit: Payer: Self-pay

## 2019-10-01 ENCOUNTER — Other Ambulatory Visit (HOSPITAL_COMMUNITY)
Admission: AD | Admit: 2019-10-01 | Discharge: 2019-10-01 | Disposition: A | Discharge status: Home / Self Care | Payer: Medicare Other | Source: Skilled Nursing Facility | Attending: Urology | Admitting: Urology

## 2019-10-01 ENCOUNTER — Ambulatory Visit (INDEPENDENT_AMBULATORY_CARE_PROVIDER_SITE_OTHER): Payer: Medicare Other | Admitting: Urology

## 2019-10-01 VITALS — BP 141/82 | HR 77 | Temp 96.6°F | Ht 60.0 in

## 2019-10-01 DIAGNOSIS — N390 Urinary tract infection, site not specified: Secondary | ICD-10-CM

## 2019-10-01 LAB — POCT URINALYSIS DIPSTICK
Blood, UA: NEGATIVE
Glucose, UA: NEGATIVE
Ketones, UA: NEGATIVE
Nitrite, UA: NEGATIVE
Protein, UA: NEGATIVE
Spec Grav, UA: <=1.005 — AB (ref 1.010–1.025)
Urobilinogen, UA: 0.2 E.U./dL
pH, UA: 8.5 — AB (ref 5.0–8.0)

## 2019-10-01 LAB — BLADDER SCAN AMB NON-IMAGING: Scan Result: 70.4

## 2019-10-01 MED ORDER — MIRABEGRON ER 25 MG PO TB24: 25.0000 mg | ORAL_TABLET | Freq: Every day | ORAL | 0 refills | Status: DC

## 2019-10-01 NOTE — Progress Notes (Signed)
10/01/2019 4:00 PM   Lindsay Walsh 1970-07-24 [id number]  Referring provider: Maryruth Hancock, MD [address] [place],  [place] [address]  Multiple UTIs  HPI: Lindsay Walsh is a 49yo seen for evaluation for recurrent UTIs. Over the past 6 months she has been treated for 3 UTIs. 2 culture results available which were  multiple species and no growth. She has an intermittent weak stream. No urinary frequency. She has daily urinary urgency and daily urge incontinence. She had a cystocele repair and possible sling with Dr. Michela Pitcher. She has a feeling of incomplete emptying. PVR 71cc.    PMH: Past Medical History:  Diagnosis Date  . Acid reflux   . Allergic rhinitis due to pollen   . Angina    took SL nitro one week ago  . Anxiety   . Arthritis    back  . Arthritis   . Autoimmune hepatitis ([place])   . Back pain   . Chest pain    Normal stress echo in 2011; PVCs; pedal edema  . CVA (cerebral infarction)   . Depression   . Depression   . Diabetes mellitus without complication ([place])   . Dizziness and giddiness   . Dyspareunia 05/06/2014  . Dysrhythmia    palpatations  . Essential (primary) hypertension   . Fasting hyperglycemia   . Fibromyalgia   . Fibromyalgia   . Gastroesophageal reflux disease    Chronic abdominal pain; gastroparesis; globus hystericus; irritable bowel syndrome  . Glaucoma   . Hereditary and idiopathic neuropathy, unspecified   . Hyperlipidemia   . Hypertension   . Impaired glucose tolerance   . Irritable bowel syndrome   . Major depressive disorder, single episode, unspecified   . Morbid (severe) obesity due to excess calories ([place])   . Narcotic dependence ([place])   . Nausea   . Nonspecific mesenteric lymphadenitis   . Overweight(278.02)   . Pain in limb   . Pain in right foot   . PTSD (post-traumatic stress disorder)   . Pulmonary nodule   . Restless legs syndrome   . Shortness of breath    with exertion  . Sleep apnea   . Sleep apnea    on CPAP  machine  . Sleep apnea, unspecified   . Stroke ([place])   . Tobacco abuse    one pack per day; 35 pack years  . Type 2 diabetes mellitus with hyperglycemia [facility])     Surgical History: Past Surgical History:  Procedure Laterality Date  . ABDOMINAL HYSTERECTOMY    . BACK SURGERY    . BLADDER SUSPENSION  09/12/2011   Procedure: TRANSVAGINAL TAPE (TVT) PROCEDURE;  Surgeon: [name], MD;  Location: [facility];  Service: Urology;  Laterality: N/A;  . CESAREAN SECTION     X3  . CHOLECYSTECTOMY    . ESOPHAGOGASTRODUODENOSCOPY ENDOSCOPY     "throat stretched" per pt.  Marland Kitchen LIVER BIOPSY     x4  . LUMBAR EPIDURAL INJECTION  01/2012  . TOTAL ABDOMINAL HYSTERECTOMY W/ BILATERAL SALPINGOOPHORECTOMY    . TUBAL LIGATION     Bilateral  . UPPER GASTROINTESTINAL ENDOSCOPY  09/13/2010    Home Medications:  Allergies as of 10/01/2019      Reactions   Doxycycline Shortness Of Breath, Rash   Fentanyl Shortness Of Breath, Itching, Other (See Comments)   Heart racing, SOB   Ceftin [cefuroxime Axetil]    Iohexol Other (See Comments)   Knots on body   Ivp Dye [iodinated  Diagnostic Agents] Other (See Comments)   Knots on body   Norvasc [amlodipine Besylate]    Wellbutrin [bupropion Hcl] Other (See Comments)   unknown   Latex Rash   Tape Rash      Medication List       Accurate as of October 01, 2019  4:00 PM. If you have any questions, ask your nurse or doctor.        ammonium lactate 12 % cream Commonly known as: AMLACTIN Apply topically as needed.   amphetamine-dextroamphetamine 20 MG tablet Commonly known as: Adderall Take 1 tablet (20 mg total) by mouth 2 (two) times daily. What changed: Another medication with the same name was removed. Continue taking this medication, and follow the directions you see here. Changed by: Nicolette Bang, MD   amphetamine-dextroamphetamine 20 MG tablet Commonly known as: Adderall Take 1 tablet (20 mg total) by mouth 2 (two) times daily. What  changed: Another medication with the same name was removed. Continue taking this medication, and follow the directions you see here. Changed by: Nicolette Bang, MD   aspirin 325 MG EC tablet Take 325 mg by mouth daily.   atorvastatin 80 MG tablet Commonly known as: LIPITOR Take 1 tablet by mouth daily.   azaTHIOprine 50 MG tablet Commonly known as: IMURAN Take 1 tablet (50 mg total) by mouth daily.   bethanechol 10 MG tablet Commonly known as: URECHOLINE Take 1 tablet (10 mg total) by mouth 3 (three) times daily before meals.   Centrum tablet Take 1 tablet by mouth daily.   DULoxetine 60 MG capsule Commonly known as: CYMBALTA TAKE 1 CAPSULE(60 MG) BY MOUTH TWICE DAILY   esomeprazole 40 MG capsule Commonly known as: NEXIUM TAKE 1 CAPSULE BY MOUTH EVERY MORNING   fexofenadine 60 MG tablet Commonly known as: ALLEGRA Take 1 tablet by mouth 2 (two) times daily as needed.   furosemide 40 MG tablet Commonly known as: LASIX Take 40 mg by mouth every other day.   gabapentin 300 MG capsule Commonly known as: NEURONTIN Take 300 mg by mouth at bedtime.   GARLIC PO Take [id number] mg by mouth daily.   hydrALAZINE 100 MG tablet Commonly known as: APRESOLINE Take 1 tablet by mouth 4 (four) times daily.   ketorolac 0.4 % Soln Commonly known as: ACULAR Place 1 drop into the left eye 4 (four) times daily.   levothyroxine 25 MCG tablet Commonly known as: SYNTHROID Take 25 mcg by mouth daily before breakfast.   lisinopril 40 MG tablet Commonly known as: ZESTRIL Take 40 mg by mouth daily.   Lumigan 0.01 % Soln Generic drug: bimatoprost Place 1 drop into both eyes at bedtime.   metFORMIN 500 MG tablet Commonly known as: GLUCOPHAGE Take 1,000 mg by mouth 2 (two) times daily with a meal.   metoprolol succinate 100 MG 24 hr tablet Commonly known as: TOPROL-XL TAKE 1 TABLET BY MOUTH EVERY DAY WITH FOOD   ofloxacin 0.3 % ophthalmic solution Commonly known as: OCUFLOX Place  1 drop into the left eye 4 (four) times daily.   ondansetron 4 MG tablet Commonly known as: ZOFRAN Take 1 tablet (4 mg total) by mouth every 8 (eight) hours as needed for nausea or vomiting.   OneTouch Ultra test strip Generic drug: glucose blood 2 (two) times daily. for testing   Os-Cal 500 + D 500-200 MG-UNIT tablet Generic drug: calcium-vitamin D Take 1 tablet by mouth 2 (two) times daily.   pramipexole 1 MG tablet Commonly known as:  MIRAPEX Take 2 tablets by mouth at bedtime.   prazosin 2 MG capsule Commonly known as: MINIPRESS TAKE 1 CAPSULE(2 MG) BY MOUTH AT BEDTIME   prednisoLONE acetate 1 % ophthalmic suspension Commonly known as: PRED FORTE Place 1 drop into the left eye 4 (four) times daily.   ProAir HFA 108 (90 Base) MCG/ACT inhaler Generic drug: albuterol Inhale 1-2 puffs into the lungs every 6 (six) hours as needed for wheezing or shortness of breath.   promethazine 25 MG tablet Commonly known as: PHENERGAN Take 1 tablet (25 mg total) by mouth every 6 (six) hours as needed for nausea or vomiting. May cause drowsiness   Restasis 0.05 % ophthalmic emulsion Generic drug: cycloSPORINE Place 1 drop into both eyes daily.   RESTORA PO Take by mouth daily.   rosuvastatin 40 MG tablet Commonly known as: CRESTOR Take 40 mg by mouth daily.   vitamin C 1000 MG tablet Take 500 mg by mouth every morning.       Allergies:  Allergies  Allergen Reactions  . Doxycycline Shortness Of Breath and Rash  . Fentanyl Shortness Of Breath, Itching and Other (See Comments)    Heart racing, SOB  . Ceftin [Cefuroxime Axetil]   . Iohexol Other (See Comments)    Knots on body   . Ivp Dye [Iodinated Diagnostic Agents] Other (See Comments)    Knots on body  . Norvasc [Amlodipine Besylate]   . Wellbutrin [Bupropion Hcl] Other (See Comments)    unknown  . Latex Rash  . Tape Rash    Family History: Family History  Problem Relation Age of Onset  . Diabetes Mother   .  Hypertension Mother   . Anxiety disorder Mother   . Depression Mother   . Drug abuse Mother   . Heart disease Father   . Diabetes Father   . Anxiety disorder Father   . Alcohol abuse Father   . Depression Father   . OCD Father   . Thyroid disease Sister   . Healthy Brother   . Healthy Daughter   . Healthy Son   . ADD / ADHD Son   . Alcohol abuse Paternal Grandfather   . Depression Paternal Grandfather   . Cancer Paternal Grandfather        lung,skin  . Tuberculosis Paternal Grandfather   . Seizures Paternal Grandmother   . Lupus Paternal Grandmother   . ADD / ADHD Son   . Anesthesia problems Neg Hx   . Malignant hyperthermia Neg Hx   . Pseudochol deficiency Neg Hx   . Hypotension Neg Hx   . Bipolar disorder Neg Hx   . Dementia Neg Hx   . Paranoid behavior Neg Hx   . Schizophrenia Neg Hx   . Sexual abuse Neg Hx   . Physical abuse Neg Hx     Social History:  reports that she quit smoking about 6 years ago. Her smoking use included cigarettes. She has a 37.50 pack-year smoking history. She has never used smokeless tobacco. She reports that she does not drink alcohol or use drugs.  ROS: All other review of systems were reviewed and are negative except what is noted above in HPI  Physical Exam: BP (!) 141/82   Pulse 77   Temp (!) 96.6 F (35.9 C)   Ht 5' (1.524 m)   BMI 38.94 kg/m   Constitutional:  Alert and oriented, No acute distress. HEENT: [place] AT, moist mucus membranes.  Trachea midline, no masses. Cardiovascular: No clubbing,  cyanosis, or edema. Respiratory: Normal respiratory effort, no increased work of breathing. GI: Abdomen is soft, nontender, nondistended, no abdominal masses GU: No CVA tenderness Lymph: No cervical or inguinal lymphadenopathy. Skin: No rashes, bruises or suspicious lesions. Neurologic: Grossly intact, no focal deficits, moving all 4 extremities. Psychiatric: Normal mood and affect.  Laboratory Data: Lab Results  Component Value Date    WBC 12.7 (H) 07/06/2019   HGB 12.8 07/06/2019   HCT 37.8 07/06/2019   MCV 91.3 07/06/2019   PLT 258 07/06/2019    Lab Results  Component Value Date   CREATININE 0.58 07/06/2019    No results found for: PSA  No results found for: TESTOSTERONE  Lab Results  Component Value Date   HGBA1C 7.2 04/25/2019    Urinalysis    Component Value Date/Time   COLORURINE YELLOW 07/06/2019 1237   APPEARANCEUR Clear 07/17/2019 1124   LABSPEC 1.011 07/06/2019 1237   PHURINE 6.0 07/06/2019 1237   GLUCOSEU Negative 07/17/2019 1124   HGBUR SMALL (A) 07/06/2019 1237   BILIRUBINUR + 10/01/2019 1550   BILIRUBINUR Negative 07/17/2019 1124   [place] 07/06/2019 1237   PROTEINUR Negative 10/01/2019 1550   PROTEINUR Negative 07/17/2019 1124   PROTEINUR NEGATIVE 07/06/2019 1237   UROBILINOGEN 0.2 10/01/2019 1550   UROBILINOGEN 0.2 10/23/2012 1625   NITRITE neg 10/01/2019 1550   NITRITE Negative 07/17/2019 1124   NITRITE NEGATIVE 07/06/2019 1237   LEUKOCYTESUR Large (3+) (A) 10/01/2019 1550   LEUKOCYTESUR 2+ (A) 07/17/2019 1124   LEUKOCYTESUR LARGE (A) 07/06/2019 1237    Lab Results  Component Value Date   LABMICR See below: 07/17/2019   WBCUA 11-30 (A) 07/17/2019   LABEPIT 0-10 07/17/2019   MUCUS Present 07/17/2019   BACTERIA Few 07/17/2019    Pertinent Imaging: No results found for this or any previous visit. No results found for this or any previous visit. No results found for this or any previous visit. No results found for this or any previous visit. Results for orders placed during the hospital encounter of 08/28/11  US Renal   Narrative *RADIOLOGY REPORT*  Clinical Data:  Stress incontinence, difficulty voiding.  RENAL/URINARY TRACT ULTRASOUND COMPLETE  Comparison:  Ultrasound of the abdomen 06/02/2008  Findings:  Right Kidney:  Normal in size and parenchymal echogenicity.  No evidence of mass or hydronephrosis.  Left Kidney:  Normal in size and  parenchymal echogenicity.  No evidence of mass or hydronephrosis.  Bladder:  Appears normal for degree of bladder distention. Prevoid volume was 246 ml.  Postvoid volume was completely empty.  IMPRESSION: Normal study.  No evidence for postvoid residual.  Original Report Authenticated By: Staci Righter, M.D.   No results found for this or any previous visit. No results found for this or any previous visit. No results found for this or any previous visit.  Assessment & Plan:    1. Recurrent UTI -urine for culture, will call with results - POCT urinalysis dipstick - BLADDER SCAN AMB NON-IMAGING  2. Urge urinary incontinence: -mirabegron 25mg  daily -RTc 4-6 weeks   No follow-ups on file.  Nicolette Bang, MD  [facility] [place]

## 2019-10-01 NOTE — Progress Notes (Signed)
Urological Symptom Review  Patient is experiencing the following symptoms: Hard to postpone urination Burning/pain with urination Leakage of urine Stream starts and stops Trouble starting stream Urinary tract infection Weak stream   Review of Systems  Gastrointestinal (upper)  : Nausea Vomiting Indigestion/heartburn  Gastrointestinal (lower) : Diarrhea  Constitutional : Night Sweats Fatigue  Skin: Itching  Eyes: Blurred vision  Ear/Nose/Throat : Sinus problems  Hematologic/Lymphatic: Easy bruising  Cardiovascular : Leg swelling Chest pain  Respiratory : Shortness of breath  Endocrine: Excessive thirst  Musculoskeletal: Back pain Joint pain  Neurological: Headaches Dizziness  Psychologic: Depression Anxiety

## 2019-10-01 NOTE — Patient Instructions (Signed)

## 2019-10-01 NOTE — Addendum Note (Signed)
Addended by: Valentina Lucks on: 10/01/2019 05:06 PM   Modules accepted: Orders

## 2019-10-02 LAB — URINE CULTURE: Culture: NO GROWTH

## 2019-10-07 ENCOUNTER — Ambulatory Visit: Payer: Medicare Other | Admitting: Nutrition

## 2019-10-15 ENCOUNTER — Telehealth: Payer: Self-pay | Admitting: Family Medicine

## 2019-10-15 NOTE — Telephone Encounter (Signed)
Please advise 

## 2019-10-15 NOTE — Telephone Encounter (Signed)
Earnest Bailey is calling from [facility] and states that she sent over a form stating they would like this patient to come off of aspirin and Dr. Holly Bodily replied back that the patient did not need to. She states the patient is also having [employer] done and with that they like for the patients to come off aspirin. She would like Dr. Holly Bodily to look at the paper she sent over again. Earnest Bailey can also be reached at [phone] ext [phone]

## 2019-10-20 ENCOUNTER — Other Ambulatory Visit (INDEPENDENT_AMBULATORY_CARE_PROVIDER_SITE_OTHER): Payer: Self-pay | Admitting: *Deleted

## 2019-10-20 ENCOUNTER — Ambulatory Visit (INDEPENDENT_AMBULATORY_CARE_PROVIDER_SITE_OTHER): Payer: Medicare Other | Admitting: Family Medicine

## 2019-10-20 ENCOUNTER — Ambulatory Visit (INDEPENDENT_AMBULATORY_CARE_PROVIDER_SITE_OTHER): Payer: Medicare Other | Admitting: Internal Medicine

## 2019-10-20 ENCOUNTER — Other Ambulatory Visit: Payer: Self-pay

## 2019-10-20 ENCOUNTER — Encounter: Payer: Self-pay | Admitting: Family Medicine

## 2019-10-20 ENCOUNTER — Encounter (INDEPENDENT_AMBULATORY_CARE_PROVIDER_SITE_OTHER): Payer: Self-pay | Admitting: Internal Medicine

## 2019-10-20 VITALS — BP 135/90 | HR 83 | Temp 97.2°F | Ht 60.0 in | Wt 198.6 lb

## 2019-10-20 VITALS — BP 112/74 | HR 85 | Temp 97.8°F | Wt 199.6 lb

## 2019-10-20 DIAGNOSIS — I10 Essential (primary) hypertension: Secondary | ICD-10-CM

## 2019-10-20 DIAGNOSIS — F515 Nightmare disorder: Secondary | ICD-10-CM

## 2019-10-20 DIAGNOSIS — E1143 Type 2 diabetes mellitus with diabetic autonomic (poly)neuropathy: Secondary | ICD-10-CM

## 2019-10-20 DIAGNOSIS — K754 Autoimmune hepatitis: Secondary | ICD-10-CM

## 2019-10-20 DIAGNOSIS — E039 Hypothyroidism, unspecified: Secondary | ICD-10-CM

## 2019-10-20 DIAGNOSIS — K219 Gastro-esophageal reflux disease without esophagitis: Secondary | ICD-10-CM

## 2019-10-20 DIAGNOSIS — I639 Cerebral infarction, unspecified: Secondary | ICD-10-CM

## 2019-10-20 DIAGNOSIS — G4730 Sleep apnea, unspecified: Secondary | ICD-10-CM

## 2019-10-20 DIAGNOSIS — E1165 Type 2 diabetes mellitus with hyperglycemia: Secondary | ICD-10-CM | POA: Diagnosis not present

## 2019-10-20 DIAGNOSIS — K3184 Gastroparesis: Secondary | ICD-10-CM | POA: Diagnosis not present

## 2019-10-20 DIAGNOSIS — F4310 Post-traumatic stress disorder, unspecified: Secondary | ICD-10-CM

## 2019-10-20 DIAGNOSIS — F329 Major depressive disorder, single episode, unspecified: Secondary | ICD-10-CM

## 2019-10-20 DIAGNOSIS — M797 Fibromyalgia: Secondary | ICD-10-CM

## 2019-10-20 DIAGNOSIS — F4312 Post-traumatic stress disorder, chronic: Secondary | ICD-10-CM

## 2019-10-20 NOTE — Progress Notes (Signed)
Presenting complaint;  Follow-up for nausea vomiting and diarrhea. Patient with history of GERD and autoimmune hepatitis.  Database and subjective:  Patient is 49 year old Caucasian female with multiple medical problems including autoimmune hepatitis maintained on low-dose azathioprine and GERD maintained on esomeprazole who was seen in January 2021 for episodic nausea vomiting followed by diarrhea.  She had EGD and colonoscopy at [facility] in October 2020.  EGD was normal and colonoscopy revealed a 5 mm tubular adenoma.  Based on her symptoms I felt she may have gastroparesis and diarrhea possibly due to IBS or due to one of her medications. Gastric emptying study was obtained and revealed gastroparesis.  She had 24% of the activity in the stomach by 4 hours.  Patient is not a candidate for metoclopramide.  She was begun on bethanechol 10 mg before each meal. She now returns for follow-up visit. Patient says she feels much better.  Since her last visit she only had one episode of nausea and vomiting which occurred within a week of her visit.  She has not had any more episodes.  She has changed her eating habits.  She tries not to eat apple peeling central.  She also has noticed significant improvement in diarrhea.  Now she is having 2-3 bowel movements per day.  Most of her stools are now solid.  She also has noted decrease in her heartburn.  She says she feels much better. Patient states on most days she takes 100 mg of hydralazine.  Occasionally she may take more than 1 dose. Patient says she is not having any urinary symptoms.  She was seen by urologist and begun on Myrbetriq. Patient says she is scheduled for left eye surgery there is very for glaucoma and cataract.  Current Medications: Outpatient Encounter Medications as of 10/20/2019  Medication Sig  . ammonium lactate (AMLACTIN) 12 % cream Apply topically as needed.   Marland Kitchen amphetamine-dextroamphetamine (ADDERALL) 20 MG tablet Take 1 tablet (20 mg  total) by mouth 2 (two) times daily.  . Ascorbic Acid (VITAMIN C) 1000 MG tablet Take 500 mg by mouth every morning.   Marland Kitchen aspirin 325 MG EC tablet Take 325 mg by mouth daily.  Marland Kitchen atorvastatin (LIPITOR) 80 MG tablet Take 1 tablet by mouth daily.  Marland Kitchen azaTHIOprine (IMURAN) 50 MG tablet Take 1 tablet (50 mg total) by mouth daily.  . bethanechol (URECHOLINE) 10 MG tablet Take 1 tablet (10 mg total) by mouth 3 (three) times daily before meals.  . bimatoprost (LUMIGAN) 0.01 % SOLN Place 1 drop into both eyes at bedtime.  . calcium-vitamin D (OS-CAL 500 + D) 500-200 MG-UNIT per tablet Take 1 tablet by mouth 2 (two) times daily.   . cycloSPORINE (RESTASIS) 0.05 % ophthalmic emulsion Place 1 drop into both eyes daily.  . DULoxetine (CYMBALTA) 60 MG capsule TAKE 1 CAPSULE(60 MG) BY MOUTH TWICE DAILY  . esomeprazole (NEXIUM) 40 MG capsule TAKE 1 CAPSULE BY MOUTH EVERY MORNING  . fexofenadine (ALLEGRA) 60 MG tablet Take 1 tablet by mouth 2 (two) times daily as needed.  . furosemide (LASIX) 40 MG tablet Take 40 mg by mouth every other day.   . gabapentin (NEURONTIN) 300 MG capsule Take 300 mg by mouth at bedtime.  Marland Kitchen GARLIC PO Take 6,378 mg by mouth daily.   . hydrALAZINE (APRESOLINE) 100 MG tablet Take 1 tablet by mouth 4 (four) times daily.  Marland Kitchen ketorolac (ACULAR) 0.4 % SOLN Place 1 drop into the left eye 4 (four) times daily.  Marland Kitchen  levothyroxine (SYNTHROID, LEVOTHROID) 25 MCG tablet Take 25 mcg by mouth daily before breakfast.  . lisinopril (PRINIVIL,ZESTRIL) 40 MG tablet Take 40 mg by mouth daily.   . metFORMIN (GLUCOPHAGE) 500 MG tablet Take 1,000 mg by mouth 2 (two) times daily with a meal.   . metoprolol succinate (TOPROL-XL) 100 MG 24 hr tablet TAKE 1 TABLET BY MOUTH EVERY DAY WITH FOOD  . mirabegron ER (MYRBETRIQ) 25 MG TB24 tablet Take 1 tablet (25 mg total) by mouth daily.  . Multiple Vitamins-Minerals (CENTRUM) tablet Take 1 tablet by mouth daily.  . ofloxacin (OCUFLOX) 0.3 % ophthalmic solution Place 1  drop into the left eye 4 (four) times daily.  . ondansetron (ZOFRAN) 4 MG tablet Take 1 tablet (4 mg total) by mouth every 8 (eight) hours as needed for nausea or vomiting.  . ONETOUCH ULTRA test strip 2 (two) times daily. for testing  . pramipexole (MIRAPEX) 1 MG tablet Take 2 tablets by mouth at bedtime.  . prazosin (MINIPRESS) 2 MG capsule TAKE 1 CAPSULE(2 MG) BY MOUTH AT BEDTIME  . prednisoLONE acetate (PRED FORTE) 1 % ophthalmic suspension Place 1 drop into the left eye 4 (four) times daily.  . PROAIR HFA 108 (90 BASE) MCG/ACT inhaler Inhale 1-2 puffs into the lungs every 6 (six) hours as needed for wheezing or shortness of breath.   . Probiotic Product (RESTORA PO) Take by mouth daily.   . promethazine (PHENERGAN) 25 MG tablet Take 1 tablet (25 mg total) by mouth every 6 (six) hours as needed for nausea or vomiting. May cause drowsiness  . rosuvastatin (CRESTOR) 40 MG tablet Take 40 mg by mouth daily.  . [DISCONTINUED] amphetamine-dextroamphetamine (ADDERALL) 20 MG tablet Take 1 tablet (20 mg total) by mouth 2 (two) times daily. (Patient not taking: Reported on 10/20/2019)   No facility-administered encounter medications on file as of 10/20/2019.    Objective: Blood pressure 135/90, pulse 83, temperature (!) 97.2 F (36.2 C), temperature source Temporal, height 5' (1.524 m), weight 198 lb 9.6 oz (90.1 kg). Patient is alert and in no acute distress. Patient is wearing facial mask. Conjunctiva is pink. Sclera is nonicteric Oropharyngeal mucosa is normal. No neck masses or thyromegaly noted. Cardiac exam with regular rhythm normal S1 and S2. No murmur or gallop noted. Lungs are clear to auscultation. Abdomen is full.  Bowel sounds are normal.  On palpation abdomen is soft and nontender with no organomegaly or masses. No LE edema or clubbing noted.  Labs/studies Results:  CBC Latest Ref Rng & Units 07/06/2019 04/25/2019 04/05/2019  WBC 4.0 - 10.5 K/uL 12.7(H) 11.8 21.8(H)  Hemoglobin  12.0 - 15.0 g/dL 12.8 13.5 14.6  Hematocrit 36.0 - 46.0 % 37.8 40 44.9  Platelets 150 - 400 K/uL 258 346 312    CMP Latest Ref Rng & Units 07/06/2019 04/25/2019 04/05/2019  Glucose 70 - 99 mg/dL 201(H) - 214(H)  BUN 6 - 20 mg/dL 14 10 17  Creatinine 0.44 - 1.00 mg/dL 0.58 0.7 0.61  Sodium 135 - 145 mmol/L 137 139 139  Potassium 3.5 - 5.1 mmol/L 4.0 4.3 4.1  Chloride 98 - 111 mmol/L 102 97(A) 104  CO2 22 - 32 mmol/L 26 28(A) 25  Calcium 8.9 - 10.3 mg/dL 8.6(L) 9.8 9.1  Total Protein 6.5 - 8.1 g/dL 7.2 - 8.2(H)  Total Bilirubin 0.3 - 1.2 mg/dL 0.7 - 0.9  Alkaline Phos 38 - 126 U/L 95 145(A) 131(H)  AST 15 - 41 U/L 20 18 23  ALT   0 - 44 U/L 21 21 25    Hepatic Function Latest Ref Rng & Units 07/06/2019 04/25/2019 04/05/2019  Total Protein 6.5 - 8.1 g/dL 7.2 - 8.2(H)  Albumin 3.5 - 5.0 g/dL 3.7 4.0 4.2  AST 15 - 41 U/L 20 18 23  ALT 0 - 44 U/L 21 21 25  Alk Phosphatase 38 - 126 U/L 95 145(A) 131(H)  Total Bilirubin 0.3 - 1.2 mg/dL 0.7 - 0.9  Bilirubin, Direct <=0.2 mg/dL - - -      Assessment:  #1.  Diabetic gastroparesis.  Her medications may also be contributing.  She is doing better with dietary measures and low-dose bethanechol.  I would like for her to try reducing the dose to 10 mg twice daily and see if it works.  #2.  Chronic GERD.  GERD symptoms are improved significantly with therapy for gastroparesis.  #3.  Autoimmune hepatitis.  She is on low-dose azathioprine and remains in biochemical remission.  She is due for blood work.  Plan:  Patient advised to skip bethanechol dose prior to her breakfast and see how she does.  If she starts having nausea she can go back to taking it 3 times a day. Patient will go to the lab for CBC with differential and comprehensive chemistry panel. She will return for office visit in 6 months.      

## 2019-10-20 NOTE — Patient Instructions (Addendum)
   Neuro to clear for surgery-Dr. Katy Fitch concerned about continued use of aspirin prior to surgery-prior CVA.   If you have lab work done today you will be contacted with your lab results within the next 2 weeks.  If you have not heard from [place] then please contact us. The fastest way to get your results is to register for My Chart.   IF you received an x-ray today, you will receive an invoice from [facility]. Please contact [facility] at [phone] with questions or concerns regarding your invoice.   IF you received labwork today, you will receive an invoice from [facility]. Please contact [facility] at [phone] with questions or concerns regarding your invoice.   Our billing staff will not be able to assist you with questions regarding bills from these companies.  You will be contacted with the lab results as soon as they are available. The fastest way to get your results is to activate your My Chart account. Instructions are located on the last page of this paperwork. If you have not heard from [place] regarding the results in 2 weeks, please contact this office.

## 2019-10-20 NOTE — Telephone Encounter (Signed)
Pt sees a neurologist. Recommended neurology evaluation for determination about aspirin during perioperative period

## 2019-10-20 NOTE — Progress Notes (Signed)
Done

## 2019-10-20 NOTE — Progress Notes (Signed)
Established Patient Office Visit  Subjective:  Patient ID: Lindsay Walsh, female    DOB: 1971/03/25  Age: 49 y.o. MRN: [id number]  CC:  Chief Complaint  Patient presents with  . Follow-up    3 month f/u for hypertension    HPI Lindsay Walsh presents for HTN-Lasix every other day and Toprol XL daily -bp at home-headache and light headed-170/100-pt took medication but felt stressed and was in pain.  Pt states this causes blood pressure to elevated.  Pt told fluid and starts swelling if lasix not taken.  Pt with CVA -2-3 years ago-concern for tobacco use and elevated blood pressure.pt states onset of symptoms on Wed and evaluation on Friday for symptoms of weakness, loss of strengthen in the left arm and leg. Pt with no difficulty swallowing or talking. Pt with neuropathy-followed by neurology for pain management and neuropathy. Pt to have surgery cataract extraction OS with [place] Goniotomy. Dr. Katy Fitch recommends stopping aspirin prior to surgery.  [employer] recommend patients continue to receive aspirin around the time of the procedure. Pt sees neurology for final decision.  Pt  No longer smoking.  Pt sees Dr. Harrington [facility] on a regular basis for treatment  Past Medical History:  Diagnosis Date  . Acid reflux   . Allergic rhinitis due to pollen   . Angina    took SL nitro one week ago  . Anxiety   . Arthritis    back  . Arthritis   . Autoimmune hepatitis ([place])   . Back pain   . Chest pain    Normal stress echo in 2011; PVCs; pedal edema  . CVA (cerebral infarction)   . Depression   . Depression   . Diabetes mellitus without complication ([place])   . Dizziness and giddiness   . Dyspareunia 05/06/2014  . Dysrhythmia    palpatations  . Essential (primary) hypertension   . Fasting hyperglycemia   . Fibromyalgia   . Fibromyalgia   . Gastroesophageal reflux disease    Chronic abdominal pain; gastroparesis; globus hystericus; irritable bowel  syndrome  . Glaucoma   . Hereditary and idiopathic neuropathy, unspecified   . Hyperlipidemia   . Hypertension   . Impaired glucose tolerance   . Irritable bowel syndrome   . Major depressive disorder, single episode, unspecified   . Morbid (severe) obesity due to excess calories ([place])   . Narcotic dependence ([place])   . Nausea   . Nonspecific mesenteric lymphadenitis   . Overweight(278.02)   . Pain in limb   . Pain in right foot   . PTSD (post-traumatic stress disorder)   . Pulmonary nodule   . Restless legs syndrome   . Shortness of breath    with exertion  . Sleep apnea   . Sleep apnea    on CPAP machine  . Sleep apnea, unspecified   . Stroke ([place])   . Tobacco abuse    one pack per day; 35 pack years  . Type 2 diabetes mellitus with hyperglycemia [facility])     Past Surgical History:  Procedure Laterality Date  . ABDOMINAL HYSTERECTOMY    . BACK SURGERY    . BLADDER SUSPENSION  09/12/2011   Procedure: TRANSVAGINAL TAPE (TVT) PROCEDURE;  Surgeon: [name], MD;  Location: [facility];  Service: Urology;  Laterality: N/A;  . CESAREAN SECTION     X3  . CHOLECYSTECTOMY    . ESOPHAGOGASTRODUODENOSCOPY ENDOSCOPY     "throat stretched" per  pt.  . LIVER BIOPSY     x4  . LUMBAR EPIDURAL INJECTION  01/2012  . TOTAL ABDOMINAL HYSTERECTOMY W/ BILATERAL SALPINGOOPHORECTOMY    . TUBAL LIGATION     Bilateral  . UPPER GASTROINTESTINAL ENDOSCOPY  09/13/2010    Family History  Problem Relation Age of Onset  . Diabetes Mother   . Hypertension Mother   . Anxiety disorder Mother   . Depression Mother   . Drug abuse Mother   . Heart disease Father   . Diabetes Father   . Anxiety disorder Father   . Alcohol abuse Father   . Depression Father   . OCD Father   . Thyroid disease Sister   . Healthy Brother   . Healthy Daughter   . Healthy Son   . ADD / ADHD Son   . Alcohol abuse Paternal Grandfather   . Depression Paternal Grandfather   . Cancer Paternal Grandfather         lung,skin  . Tuberculosis Paternal Grandfather   . Seizures Paternal Grandmother   . Lupus Paternal Grandmother   . ADD / ADHD Son   . Anesthesia problems Neg Hx   . Malignant hyperthermia Neg Hx   . Pseudochol deficiency Neg Hx   . Hypotension Neg Hx   . Bipolar disorder Neg Hx   . Dementia Neg Hx   . Paranoid behavior Neg Hx   . Schizophrenia Neg Hx   . Sexual abuse Neg Hx   . Physical abuse Neg Hx     Social History   Socioeconomic History  . Marital status: Divorced    Spouse name: Not on file  . Number of children: 3  . Years of education: GED  . Highest education level: Not on file  Occupational History    Employer: NOT EMPLOYED  Tobacco Use  . Smoking status: Former Smoker    Packs/day: 1.50    Years: 25.00    Pack years: 37.50    Types: Cigarettes    Quit date: 04/25/2013    Years since quitting: 6.4  . Smokeless tobacco: Never Used  . Tobacco comment: 15-20 cigs a day as of 11/12/2012  Substance and Sexual Activity  . Alcohol use: No    Alcohol/week: 0.0 standard drinks  . Drug use: No  . Sexual activity: Yes    Birth control/protection: Surgical  Other Topics Concern  . Not on file  Social History Narrative  . Not on file   Social Determinants of Health   Financial Resource Strain:   . Difficulty of Paying Living Expenses:   Food Insecurity:   . Worried About Charity fundraiser in the Last Year:   . Arboriculturist in the Last Year:   Transportation Needs:   . Film/video editor (Medical):   Marland Kitchen Lack of Transportation (Non-Medical):   Physical Activity:   . Days of Exercise per Week:   . Minutes of Exercise per Session:   Stress:   . Feeling of Stress :   Social Connections:   . Frequency of Communication with Friends and Family:   . Frequency of Social Gatherings with Friends and Family:   . Attends Religious Services:   . Active Member of Clubs or Organizations:   . Attends Archivist Meetings:   Marland Kitchen Marital Status:   Intimate  Partner Violence:   . Fear of Current or Ex-Partner:   . Emotionally Abused:   Marland Kitchen Physically Abused:   . Sexually Abused:  Outpatient Medications Prior to Visit  Medication Sig Dispense Refill  . ammonium lactate (AMLACTIN) 12 % cream Apply topically as needed.     Marland Kitchen amphetamine-dextroamphetamine (ADDERALL) 20 MG tablet Take 1 tablet (20 mg total) by mouth 2 (two) times daily. 60 tablet 0  . Ascorbic Acid (VITAMIN C) 1000 MG tablet Take 500 mg by mouth every morning.     Marland Kitchen aspirin 325 MG EC tablet Take 325 mg by mouth daily.    Marland Kitchen azaTHIOprine (IMURAN) 50 MG tablet Take 1 tablet (50 mg total) by mouth daily. 30 tablet 5  . bethanechol (URECHOLINE) 10 MG tablet Take 1 tablet (10 mg total) by mouth 3 (three) times daily before meals. 90 tablet 2  . bimatoprost (LUMIGAN) 0.01 % SOLN Place 1 drop into both eyes at bedtime.    . calcium-vitamin D (OS-CAL 500 + D) 500-200 MG-UNIT per tablet Take 1 tablet by mouth 2 (two) times daily.     . cycloSPORINE (RESTASIS) 0.05 % ophthalmic emulsion Place 1 drop into both eyes daily.    . DULoxetine (CYMBALTA) 60 MG capsule TAKE 1 CAPSULE(60 MG) BY MOUTH TWICE DAILY 180 capsule 2  . esomeprazole (NEXIUM) 40 MG capsule TAKE 1 CAPSULE BY MOUTH EVERY MORNING 90 capsule 3  . fexofenadine (ALLEGRA) 60 MG tablet Take 1 tablet by mouth 2 (two) times daily as needed.    . furosemide (LASIX) 40 MG tablet Take 40 mg by mouth every other day.     . gabapentin (NEURONTIN) 300 MG capsule Take 300 mg by mouth at bedtime.    Marland Kitchen GARLIC PO Take [id number] mg by mouth daily.     . hydrALAZINE (APRESOLINE) 100 MG tablet Take 1 tablet by mouth 4 (four) times daily.    Marland Kitchen ketorolac (ACULAR) 0.4 % SOLN Place 1 drop into the left eye 4 (four) times daily.    Marland Kitchen levothyroxine (SYNTHROID, LEVOTHROID) 25 MCG tablet Take 25 mcg by mouth daily before breakfast.    . lisinopril (PRINIVIL,ZESTRIL) 40 MG tablet Take 40 mg by mouth daily.     . metFORMIN (GLUCOPHAGE) 500 MG tablet Take 1,000  mg by mouth 2 (two) times daily with a meal.     . metoprolol succinate (TOPROL-XL) 100 MG 24 hr tablet TAKE 1 TABLET BY MOUTH EVERY DAY WITH FOOD 90 tablet 0  . mirabegron ER (MYRBETRIQ) 25 MG TB24 tablet Take 1 tablet (25 mg total) by mouth daily. 30 tablet 0  . Multiple Vitamins-Minerals (CENTRUM) tablet Take 1 tablet by mouth daily.    Marland Kitchen ofloxacin (OCUFLOX) 0.3 % ophthalmic solution Place 1 drop into the left eye 4 (four) times daily.    . ondansetron (ZOFRAN) 4 MG tablet Take 1 tablet (4 mg total) by mouth every 8 (eight) hours as needed for nausea or vomiting. 4 tablet 0  . ONETOUCH ULTRA test strip 2 (two) times daily. for testing    . pramipexole (MIRAPEX) 1 MG tablet Take 2 tablets by mouth at bedtime.    . prazosin (MINIPRESS) 2 MG capsule TAKE 1 CAPSULE(2 MG) BY MOUTH AT BEDTIME 90 capsule 2  . prednisoLONE acetate (PRED FORTE) 1 % ophthalmic suspension Place 1 drop into the left eye 4 (four) times daily.    Marland Kitchen PROAIR HFA 108 (90 BASE) MCG/ACT inhaler Inhale 1-2 puffs into the lungs every 6 (six) hours as needed for wheezing or shortness of breath.     . Probiotic Product (RESTORA PO) Take by mouth daily.     Marland Kitchen  promethazine (PHENERGAN) 25 MG tablet Take 1 tablet (25 mg total) by mouth every 6 (six) hours as needed for nausea or vomiting. May cause drowsiness 12 tablet 0  . rosuvastatin (CRESTOR) 40 MG tablet Take 40 mg by mouth daily.     No facility-administered medications prior to visit.    Allergies  Allergen Reactions  . Doxycycline Shortness Of Breath and Rash  . Fentanyl Shortness Of Breath, Itching and Other (See Comments)    Heart racing, SOB  . Ceftin [Cefuroxime Axetil]   . Iohexol Other (See Comments)    Knots on body   . Ivp Dye [Iodinated Diagnostic Agents] Other (See Comments)    Knots on body  . Norvasc [Amlodipine Besylate]   . Wellbutrin [Bupropion Hcl] Other (See Comments)    unknown  . Latex Rash  . Tape Rash    ROS Review of Systems  Respiratory:  Negative.   Cardiovascular: Negative.   Gastrointestinal:       Liver disease--GI following-sees [facility]-Imuran/Bethanechol GERD-Nexium  Endocrine:       Thyroid disease Fasting glucose-174  Musculoskeletal:       Fibromyalgia  Neurological: Positive for dizziness.       Episodic dizziness-does not correlated with blood pressure and heart rate  Neuropathy-extremities-difficulty with walking-pain with taking a shower  RLS-mirapex-  Psychiatric/Behavioral:       "manic depressive" diagnosed younger age-cymbalta Pt with dysthymia-uses Adderall, Cymbalta PTSD-minipress to suppress nightmares      Objective:    Physical Exam  Constitutional: She is oriented to person, place, and time. She appears well-developed and well-nourished.  HENT:  Head: Normocephalic and atraumatic.  Eyes: Conjunctivae are normal.  Cardiovascular: Normal rate, regular rhythm, normal heart sounds and intact distal pulses.  Pulmonary/Chest: Effort normal and breath sounds normal.  Musculoskeletal:        General: Normal range of motion.     Cervical back: Normal range of motion.  Neurological: She is oriented to person, place, and time.  Psychiatric: She has a normal mood and affect. Her behavior is normal.    BP 112/74 (BP Location: Right Arm, Patient Position: Sitting, Cuff Size: Large)   Pulse 85   Temp 97.8 F (36.6 C) (Temporal)   Wt 199 lb 9.6 oz (90.5 kg)   SpO2 98%   BMI 38.98 kg/m  Wt Readings from Last 3 Encounters:  10/20/19 199 lb 9.6 oz (90.5 kg)  10/20/19 198 lb 9.6 oz (90.1 kg)  08/21/19 199 lb 6.4 oz (90.4 kg)     Health Maintenance Due  Topic Date Due  . FOOT EXAM  Never done  . OPHTHALMOLOGY EXAM  Never done  . TETANUS/TDAP  Never done  . PAP SMEAR-Modifier  05/06/2017    Lab Results  Component Value Date   TSH 3.34 04/25/2019   Lab Results  Component Value Date   WBC 12.7 (H) 07/06/2019   HGB 12.8 07/06/2019   HCT 37.8 07/06/2019    MCV 91.3 07/06/2019   PLT 258 07/06/2019   Lab Results  Component Value Date   NA 137 07/06/2019   K 4.0 07/06/2019   CO2 26 07/06/2019   GLUCOSE 201 (H) 07/06/2019   BUN 14 07/06/2019   CREATININE 0.58 07/06/2019   BILITOT 0.7 07/06/2019   ALKPHOS 95 07/06/2019   AST 20 07/06/2019   ALT 21 07/06/2019   PROT 7.2 07/06/2019   ALBUMIN 3.7 07/06/2019   CALCIUM 8.6 (L) 07/06/2019   ANIONGAP 9  07/06/2019   Lab Results  Component Value Date   CHOL 153 04/25/2019   Lab Results  Component Value Date   HDL 37 04/25/2019   Lab Results  Component Value Date   LDLCALC 91 04/25/2019   Lab Results  Component Value Date   TRIG 141 04/25/2019   Lab Results  Component Value Date   CHOLHDL 4.9 03/06/2015   Lab Results  Component Value Date   HGBA1C 7.2 04/25/2019    Assessment & Plan:   1. Type 2 diabetes mellitus with hyperglycemia, unspecified whether long term insulin use (HCC) A1c 7.2%-10/20  2. Essential hypertension Blood pressure well controlled Lasix/zestril/toprol-stable-pt quit smoking, continues to have stress 3. Gastroesophageal reflux disease without esophagitis Nexium -GI following  4. Hypothyroidism, unspecified type Synthroid-TSH 3.34  5. Hepatitis, autoimmune ([place]) GI-[facility] -stable 6. Sleep apnea, unspecified type CPAP 7. Cerebral infarction, unspecified mechanism ([place]) Concern for taking pt off aspirin for surgery-neurology evaluation to determine risk to stopping for cataract surgery Neurology follows pt on a regular basis   8. Nightmares associated with chronic post-traumatic stress disorder Minipress-controlling  9. Fibromyalgia Neurology following Neurontin/cymbalta-bladder no emptying correctly-started myrbetiriq  10. Major depressive disorder with single episode, remission status unspecified adderall for dysthymia-sees Behavioral Health Follow-up-neurology for surgical release for surgery   Lashan Macias Hannah Beat, MD

## 2019-10-20 NOTE — Patient Instructions (Signed)
Try decreasing bethanechol dose to twice daily and see how you do.  If necessary you can go back to taking it 3 times a day like you are doing. Physician will call with results of blood test.

## 2019-10-21 LAB — CBC WITH DIFFERENTIAL/PLATELET
Basophils Absolute: 0.1 10*3/uL (ref 0.0–0.2)
Basos: 1 %
EOS (ABSOLUTE): 0.1 10*3/uL (ref 0.0–0.4)
Eos: 2 %
Hematocrit: 37.8 % (ref 34.0–46.6)
Hemoglobin: 12.7 g/dL (ref 11.1–15.9)
Immature Grans (Abs): 0 10*3/uL (ref 0.0–0.1)
Immature Granulocytes: 0 %
Lymphocytes Absolute: 1.7 10*3/uL (ref 0.7–3.1)
Lymphs: 19 %
MCH: 30.3 pg (ref 26.6–33.0)
MCHC: 33.6 g/dL (ref 31.5–35.7)
MCV: 90 fL (ref 79–97)
Monocytes Absolute: 0.7 10*3/uL (ref 0.1–0.9)
Monocytes: 8 %
Neutrophils Absolute: 6.3 10*3/uL (ref 1.4–7.0)
Neutrophils: 70 %
Platelets: 300 10*3/uL (ref 150–450)
RBC: 4.19 x10E6/uL (ref 3.77–5.28)
RDW: 12.5 % (ref 11.7–15.4)
WBC: 9 10*3/uL (ref 3.4–10.8)

## 2019-10-21 LAB — COMPREHENSIVE METABOLIC PANEL
ALT: 15 IU/L (ref 0–32)
AST: 17 IU/L (ref 0–40)
Albumin/Globulin Ratio: 1.5 (ref 1.2–2.2)
Albumin: 4.1 g/dL (ref 3.8–4.8)
Alkaline Phosphatase: 120 IU/L — ABNORMAL HIGH (ref 39–117)
BUN/Creatinine Ratio: 10 (ref 9–23)
BUN: 7 mg/dL (ref 6–24)
Bilirubin Total: 0.5 mg/dL (ref 0.0–1.2)
CO2: 25 mmol/L (ref 20–29)
Calcium: 9.1 mg/dL (ref 8.7–10.2)
Chloride: 99 mmol/L (ref 96–106)
Creatinine, Ser: 0.67 mg/dL (ref 0.57–1.00)
GFR calc Af Amer: 119 mL/min/{1.73_m2} (ref >59)
GFR calc non Af Amer: 104 mL/min/{1.73_m2} (ref >59)
Globulin, Total: 2.8 g/dL (ref 1.5–4.5)
Glucose: 216 mg/dL — ABNORMAL HIGH (ref 65–99)
Potassium: 4.6 mmol/L (ref 3.5–5.2)
Sodium: 139 mmol/L (ref 134–144)
Total Protein: 6.9 g/dL (ref 6.0–8.5)

## 2019-10-21 NOTE — Telephone Encounter (Signed)
Holly from [place] was informed of below msg

## 2019-11-05 ENCOUNTER — Encounter: Payer: Self-pay | Admitting: Urology

## 2019-11-05 ENCOUNTER — Other Ambulatory Visit: Payer: Self-pay

## 2019-11-05 ENCOUNTER — Ambulatory Visit (INDEPENDENT_AMBULATORY_CARE_PROVIDER_SITE_OTHER): Payer: Medicare Other | Admitting: Urology

## 2019-11-05 VITALS — BP 129/87 | HR 125 | Temp 97.9°F | Ht 60.0 in | Wt 198.0 lb

## 2019-11-05 DIAGNOSIS — N3281 Overactive bladder: Secondary | ICD-10-CM

## 2019-11-05 DIAGNOSIS — N390 Urinary tract infection, site not specified: Secondary | ICD-10-CM | POA: Diagnosis not present

## 2019-11-05 LAB — POCT URINALYSIS DIPSTICK
Blood, UA: NEGATIVE
Glucose, UA: NEGATIVE
Nitrite, UA: NEGATIVE
Protein, UA: NEGATIVE
Spec Grav, UA: 1.015 (ref 1.010–1.025)
Urobilinogen, UA: NEGATIVE E.U./dL — AB
pH, UA: 7.5 (ref 5.0–8.0)

## 2019-11-05 MED ORDER — MIRABEGRON ER 50 MG PO TB24: 50.0000 mg | ORAL_TABLET | Freq: Every day | ORAL | 11 refills | Status: DC

## 2019-11-05 NOTE — Progress Notes (Signed)
11/05/2019 2:31 PM   Lindsay Walsh October 05, 1970 [id number]  Referring provider: Maryruth Hancock, MD [address] [place],  [place] [address]  Urinary incontinence  HPI: Ms Nano is a 49yo here for folowup for recurrent UTI and OAB. No UTIs since last visit. Urine culture last visit was normal. Mirabegron 25mg  improved her incontinent episodes but she still has 2-3x episodes per day. No nocturia. Stream good. No dysuria. No hematuria   PMH: Past Medical History:  Diagnosis Date  . Acid reflux   . Allergic rhinitis due to pollen   . Angina    took SL nitro one week ago  . Anxiety   . Arthritis    back  . Arthritis   . Autoimmune hepatitis ([place])   . Back pain   . Chest pain    Normal stress echo in 2011; PVCs; pedal edema  . CVA (cerebral infarction)   . Depression   . Depression   . Diabetes mellitus without complication ([place])   . Dizziness and giddiness   . Dyspareunia 05/06/2014  . Dysrhythmia    palpatations  . Essential (primary) hypertension   . Fasting hyperglycemia   . Fibromyalgia   . Fibromyalgia   . Gastroesophageal reflux disease    Chronic abdominal pain; gastroparesis; globus hystericus; irritable bowel syndrome  . Glaucoma   . Hereditary and idiopathic neuropathy, unspecified   . Hyperlipidemia   . Hypertension   . Impaired glucose tolerance   . Irritable bowel syndrome   . Major depressive disorder, single episode, unspecified   . Morbid (severe) obesity due to excess calories ([place])   . Narcotic dependence ([place])   . Nausea   . Nonspecific mesenteric lymphadenitis   . Overweight(278.02)   . Pain in limb   . Pain in right foot   . PTSD (post-traumatic stress disorder)   . Pulmonary nodule   . Restless legs syndrome   . Shortness of breath    with exertion  . Sleep apnea   . Sleep apnea    on CPAP machine  . Sleep apnea, unspecified   . Stroke ([place])   . Tobacco abuse    one pack per day; 35 pack years  . Type 2 diabetes mellitus with  hyperglycemia [facility])     Surgical History: Past Surgical History:  Procedure Laterality Date  . ABDOMINAL HYSTERECTOMY    . BACK SURGERY    . BLADDER SUSPENSION  09/12/2011   Procedure: TRANSVAGINAL TAPE (TVT) PROCEDURE;  Surgeon: [name], MD;  Location: [facility];  Service: Urology;  Laterality: N/A;  . CESAREAN SECTION     X3  . CHOLECYSTECTOMY    . ESOPHAGOGASTRODUODENOSCOPY ENDOSCOPY     "throat stretched" per pt.  Marland Kitchen LIVER BIOPSY     x4  . LUMBAR EPIDURAL INJECTION  01/2012  . TOTAL ABDOMINAL HYSTERECTOMY W/ BILATERAL SALPINGOOPHORECTOMY    . TUBAL LIGATION     Bilateral  . UPPER GASTROINTESTINAL ENDOSCOPY  09/13/2010    Home Medications:  Allergies as of 11/05/2019      Reactions   Doxycycline Shortness Of Breath, Rash   Fentanyl Shortness Of Breath, Itching, Other (See Comments)   Heart racing, SOB   Ceftin [cefuroxime Axetil]    Iohexol Other (See Comments)   Knots on body   Ivp Dye [iodinated Diagnostic Agents] Other (See Comments)   Knots on body   Norvasc [amlodipine Besylate]    Wellbutrin [bupropion Hcl] Other (See Comments)   unknown  Latex Rash   Tape Rash      Medication List       Accurate as of November 05, 2019  2:31 PM. If you have any questions, ask your nurse or doctor.        ammonium lactate 12 % cream Commonly known as: AMLACTIN Apply topically as needed.   amphetamine-dextroamphetamine 20 MG tablet Commonly known as: Adderall Take 1 tablet (20 mg total) by mouth 2 (two) times daily.   aspirin 325 MG EC tablet Take 325 mg by mouth daily.   azaTHIOprine 50 MG tablet Commonly known as: IMURAN Take 1 tablet (50 mg total) by mouth daily.   bethanechol 10 MG tablet Commonly known as: URECHOLINE Take 1 tablet (10 mg total) by mouth 3 (three) times daily before meals.   Centrum tablet Take 1 tablet by mouth daily.   DULoxetine 60 MG capsule Commonly known as: CYMBALTA TAKE 1 CAPSULE(60 MG) BY MOUTH TWICE DAILY   esomeprazole  40 MG capsule Commonly known as: NEXIUM TAKE 1 CAPSULE BY MOUTH EVERY MORNING   fexofenadine 60 MG tablet Commonly known as: ALLEGRA Take 1 tablet by mouth 2 (two) times daily as needed.   furosemide 40 MG tablet Commonly known as: LASIX Take 40 mg by mouth every other day.   gabapentin 300 MG capsule Commonly known as: NEURONTIN Take 300 mg by mouth at bedtime.   GARLIC PO Take [id number] mg by mouth daily.   hydrALAZINE 100 MG tablet Commonly known as: APRESOLINE Take 1 tablet by mouth 4 (four) times daily.   ketorolac 0.4 % Soln Commonly known as: ACULAR Place 1 drop into the left eye 4 (four) times daily.   levothyroxine 25 MCG tablet Commonly known as: SYNTHROID Take 25 mcg by mouth daily before breakfast.   lisinopril 40 MG tablet Commonly known as: ZESTRIL Take 40 mg by mouth daily.   Lumigan 0.01 % Soln Generic drug: bimatoprost Place 1 drop into both eyes at bedtime.   metFORMIN 500 MG tablet Commonly known as: GLUCOPHAGE Take 1,000 mg by mouth 2 (two) times daily with a meal.   metoprolol succinate 100 MG 24 hr tablet Commonly known as: TOPROL-XL TAKE 1 TABLET BY MOUTH EVERY DAY WITH FOOD   mirabegron ER 25 MG Tb24 tablet Commonly known as: MYRBETRIQ Take 1 tablet (25 mg total) by mouth daily.   ofloxacin 0.3 % ophthalmic solution Commonly known as: OCUFLOX Place 1 drop into the left eye 4 (four) times daily.   ondansetron 4 MG tablet Commonly known as: ZOFRAN Take 1 tablet (4 mg total) by mouth every 8 (eight) hours as needed for nausea or vomiting.   OneTouch Ultra test strip Generic drug: glucose blood 2 (two) times daily. for testing   Os-Cal 500 + D 500-200 MG-UNIT tablet Generic drug: calcium-vitamin D Take 1 tablet by mouth 2 (two) times daily.   pramipexole 1 MG tablet Commonly known as: MIRAPEX Take 2 tablets by mouth at bedtime.   prazosin 2 MG capsule Commonly known as: MINIPRESS TAKE 1 CAPSULE(2 MG) BY MOUTH AT BEDTIME     prednisoLONE acetate 1 % ophthalmic suspension Commonly known as: PRED FORTE Place 1 drop into the left eye 4 (four) times daily.   ProAir HFA 108 (90 Base) MCG/ACT inhaler Generic drug: albuterol Inhale 1-2 puffs into the lungs every 6 (six) hours as needed for wheezing or shortness of breath.   promethazine 25 MG tablet Commonly known as: PHENERGAN Take 1 tablet (25 mg total) by  mouth every 6 (six) hours as needed for nausea or vomiting. May cause drowsiness   Restasis 0.05 % ophthalmic emulsion Generic drug: cycloSPORINE Place 1 drop into both eyes daily.   RESTORA PO Take by mouth daily.   rosuvastatin 40 MG tablet Commonly known as: CRESTOR Take 40 mg by mouth daily.   vitamin C 1000 MG tablet Take 500 mg by mouth every morning.       Allergies:  Allergies  Allergen Reactions  . Doxycycline Shortness Of Breath and Rash  . Fentanyl Shortness Of Breath, Itching and Other (See Comments)    Heart racing, SOB  . Ceftin [Cefuroxime Axetil]   . Iohexol Other (See Comments)    Knots on body   . Ivp Dye [Iodinated Diagnostic Agents] Other (See Comments)    Knots on body  . Norvasc [Amlodipine Besylate]   . Wellbutrin [Bupropion Hcl] Other (See Comments)    unknown  . Latex Rash  . Tape Rash    Family History: Family History  Problem Relation Age of Onset  . Diabetes Mother   . Hypertension Mother   . Anxiety disorder Mother   . Depression Mother   . Drug abuse Mother   . Heart disease Father   . Diabetes Father   . Anxiety disorder Father   . Alcohol abuse Father   . Depression Father   . OCD Father   . Thyroid disease Sister   . Healthy Brother   . Healthy Daughter   . Healthy Son   . ADD / ADHD Son   . Alcohol abuse Paternal Grandfather   . Depression Paternal Grandfather   . Cancer Paternal Grandfather        lung,skin  . Tuberculosis Paternal Grandfather   . Seizures Paternal Grandmother   . Lupus Paternal Grandmother   . ADD / ADHD Son   .  Anesthesia problems Neg Hx   . Malignant hyperthermia Neg Hx   . Pseudochol deficiency Neg Hx   . Hypotension Neg Hx   . Bipolar disorder Neg Hx   . Dementia Neg Hx   . Paranoid behavior Neg Hx   . Schizophrenia Neg Hx   . Sexual abuse Neg Hx   . Physical abuse Neg Hx     Social History:  reports that she quit smoking about 6 years ago. Her smoking use included cigarettes. She has a 37.50 pack-year smoking history. She has never used smokeless tobacco. She reports that she does not drink alcohol or use drugs.  ROS: All other review of systems were reviewed and are negative except what is noted above in HPI  Physical Exam: BP 129/87   Pulse (!) 125   Temp 97.9 F (36.6 C)   Ht 5' (1.524 m)   Wt 198 lb (89.8 kg)   BMI 38.67 kg/m   Constitutional:  Alert and oriented, No acute distress. HEENT: [place] AT, moist mucus membranes.  Trachea midline, no masses. Cardiovascular: No clubbing, cyanosis, or edema. Respiratory: Normal respiratory effort, no increased work of breathing. GI: Abdomen is soft, nontender, nondistended, no abdominal masses GU: No CVA tenderness. .  Lymph: No cervical or inguinal lymphadenopathy. Skin: No rashes, bruises or suspicious lesions. Neurologic: Grossly intact, no focal deficits, moving all 4 extremities. Psychiatric: Normal mood and affect.  Laboratory Data: Lab Results  Component Value Date   WBC 9.0 10/20/2019   HGB 12.7 10/20/2019   HCT 37.8 10/20/2019   MCV 90 10/20/2019   PLT 300 10/20/2019  Lab Results  Component Value Date   CREATININE 0.67 10/20/2019    No results found for: PSA  No results found for: TESTOSTERONE  Lab Results  Component Value Date   HGBA1C 7.2 04/25/2019    Urinalysis    Component Value Date/Time   COLORURINE YELLOW 07/06/2019 1237   APPEARANCEUR Clear 07/17/2019 1124   LABSPEC 1.011 07/06/2019 1237   PHURINE 6.0 07/06/2019 1237   GLUCOSEU Negative 07/17/2019 1124   HGBUR SMALL (A) 07/06/2019 1237    BILIRUBINUR + 10/01/2019 1550   BILIRUBINUR Negative 07/17/2019 1124   [place] 07/06/2019 1237   PROTEINUR Negative 10/01/2019 1550   PROTEINUR Negative 07/17/2019 1124   PROTEINUR NEGATIVE 07/06/2019 1237   UROBILINOGEN 0.2 10/01/2019 1550   UROBILINOGEN 0.2 10/23/2012 1625   NITRITE neg 10/01/2019 1550   NITRITE Negative 07/17/2019 1124   NITRITE NEGATIVE 07/06/2019 1237   LEUKOCYTESUR Large (3+) (A) 10/01/2019 1550   LEUKOCYTESUR 2+ (A) 07/17/2019 1124   LEUKOCYTESUR LARGE (A) 07/06/2019 1237    Lab Results  Component Value Date   LABMICR See below: 07/17/2019   WBCUA 11-30 (A) 07/17/2019   LABEPIT 0-10 07/17/2019   MUCUS Present 07/17/2019   BACTERIA Few 07/17/2019    Pertinent Imaging:  No results found for this or any previous visit. No results found for this or any previous visit. No results found for this or any previous visit. No results found for this or any previous visit. Results for orders placed during the hospital encounter of 08/28/11  US Renal   Narrative *RADIOLOGY REPORT*  Clinical Data:  Stress incontinence, difficulty voiding.  RENAL/URINARY TRACT ULTRASOUND COMPLETE  Comparison:  Ultrasound of the abdomen 06/02/2008  Findings:  Right Kidney:  Normal in size and parenchymal echogenicity.  No evidence of mass or hydronephrosis.  Left Kidney:  Normal in size and parenchymal echogenicity.  No evidence of mass or hydronephrosis.  Bladder:  Appears normal for degree of bladder distention. Prevoid volume was 246 ml.  Postvoid volume was completely empty.  IMPRESSION: Normal study.  No evidence for postvoid residual.  Original Report Authenticated By: Staci Righter, M.D.   No results found for this or any previous visit. No results found for this or any previous visit. No results found for this or any previous visit.  Assessment & Plan:    1. Recurrent UTI -observation - POCT urinalysis dipstick  2. OAB (overactive  bladder) -increase mirabegron to 50mg  -RTC 3 months   No follow-ups on file.  Nicolette Bang, MD  [facility] [place]

## 2019-11-05 NOTE — Patient Instructions (Signed)

## 2019-11-05 NOTE — Progress Notes (Signed)
Urological Symptom Review  Patient is experiencing the following symptoms: Leakage of urine Weak stream   Review of Systems  Gastrointestinal (upper)  : Negative for upper GI symptoms  Gastrointestinal (lower) : Diarrhea  Constitutional : Night Sweats Fatigue  Skin: Negative for skin symptoms  Eyes: Blurred vision  Ear/Nose/Throat : Sinus problems  Hematologic/Lymphatic: Easy bruising  Cardiovascular : Negative for cardiovascular symptoms  Respiratory : Negative for respiratory symptoms  Endocrine: Excessive thirst  Musculoskeletal: Back pain Joint pain Neurological: Negative for neurological symptoms  Psychologic: Depression

## 2019-11-12 ENCOUNTER — Other Ambulatory Visit: Payer: Self-pay | Admitting: Urology

## 2019-11-12 ENCOUNTER — Encounter: Payer: Medicare Other | Attending: Internal Medicine | Admitting: Nutrition

## 2019-11-18 ENCOUNTER — Other Ambulatory Visit: Payer: Self-pay

## 2019-11-18 ENCOUNTER — Other Ambulatory Visit: Payer: Self-pay | Admitting: Cardiovascular Disease

## 2019-11-18 ENCOUNTER — Telehealth: Payer: Self-pay | Admitting: Cardiovascular Disease

## 2019-11-18 DIAGNOSIS — E1165 Type 2 diabetes mellitus with hyperglycemia: Secondary | ICD-10-CM

## 2019-11-18 MED ORDER — GLUCOSE BLOOD VI STRP: ORAL_STRIP | 5 refills | Status: DC

## 2019-11-18 MED ORDER — ONETOUCH DELICA LANCETS 33G MISC
1.0000 [IU] | Freq: Two times a day (BID) | 1 refills | Status: DC
Start: 2019-11-18 — End: 2020-01-26

## 2019-11-18 MED ORDER — METOPROLOL SUCCINATE ER 100 MG PO TB24: 100.0000 mg | ORAL_TABLET | Freq: Every day | ORAL | 0 refills | Status: DC

## 2019-11-18 NOTE — Telephone Encounter (Signed)
Refill complete 

## 2019-11-18 NOTE — Telephone Encounter (Signed)
Needing refill on metoprolol succinate (TOPROL-XL) 100 MG 24 hr tablet [id number]  -- has apt scheduled w/ Nishan on 12/18/2019  Pt is out

## 2019-12-03 ENCOUNTER — Other Ambulatory Visit (INDEPENDENT_AMBULATORY_CARE_PROVIDER_SITE_OTHER): Payer: Self-pay | Admitting: Internal Medicine

## 2019-12-04 ENCOUNTER — Ambulatory Visit (INDEPENDENT_AMBULATORY_CARE_PROVIDER_SITE_OTHER): Payer: Medicare Other | Admitting: Nurse Practitioner

## 2019-12-09 ENCOUNTER — Other Ambulatory Visit: Payer: Self-pay

## 2019-12-09 ENCOUNTER — Ambulatory Visit (INDEPENDENT_AMBULATORY_CARE_PROVIDER_SITE_OTHER): Payer: Medicare Other | Admitting: Internal Medicine

## 2019-12-09 ENCOUNTER — Encounter (INDEPENDENT_AMBULATORY_CARE_PROVIDER_SITE_OTHER): Payer: Self-pay | Admitting: Internal Medicine

## 2019-12-09 VITALS — BP 130/90 | HR 118 | Temp 97.3°F | Ht 60.0 in | Wt 198.2 lb

## 2019-12-09 DIAGNOSIS — E039 Hypothyroidism, unspecified: Secondary | ICD-10-CM | POA: Diagnosis not present

## 2019-12-09 DIAGNOSIS — E1159 Type 2 diabetes mellitus with other circulatory complications: Secondary | ICD-10-CM | POA: Diagnosis not present

## 2019-12-09 DIAGNOSIS — K754 Autoimmune hepatitis: Secondary | ICD-10-CM | POA: Diagnosis not present

## 2019-12-09 DIAGNOSIS — I10 Essential (primary) hypertension: Secondary | ICD-10-CM

## 2019-12-09 DIAGNOSIS — L989 Disorder of the skin and subcutaneous tissue, unspecified: Secondary | ICD-10-CM

## 2019-12-09 MED ORDER — BLOOD GLUCOSE MONITOR KIT: PACK | 0 refills | Status: DC

## 2019-12-09 MED ORDER — THYROID 60 MG PO TABS: 60.0000 mg | ORAL_TABLET | Freq: Every day | ORAL | 3 refills | Status: DC

## 2019-12-09 NOTE — Progress Notes (Signed)
Metrics: Intervention Frequency ACO  Documented Smoking Status Yearly  Screened one or more times in 24 months  Cessation Counseling or  Active cessation medication Past 24 months  Past 24 months   Guideline developer: UpToDate (See UpToDate for funding source) Date Released: 2014       Wellness Office Visit  Subjective:  Patient ID: Lindsay Walsh, female    DOB: [birth year]  Age: 49 y.o. MRN: [id number]  CC: This 49 year old lady comes in as a new patient to establish care. HPI  She has multiple medical problems which are outlined below.  The main features include diabetes, autoimmune hepatitis, cerebrovascular disease, PTSD, depression, hypothyroidism, hyperlipidemia. She would like to feel better.  Her main complaint appears to be significant fatigue.  She is taking a small dose of levothyroxine for her hypothyroidism. She is complaining of a left forearm lesion on the skin which she has had for several months.  She wants to make sure this is not cancerous. Past Medical History:  Diagnosis Date  . Acid reflux   . Allergic rhinitis due to pollen   . Angina    took SL nitro one week ago  . Anxiety   . Arthritis    back  . Arthritis   . Autoimmune hepatitis ([place])   . Back pain   . Chest pain    Normal stress echo in 2011; PVCs; pedal edema  . CVA (cerebral infarction)   . Depression   . Depression   . Diabetes mellitus without complication ([place])   . Dizziness and giddiness   . Dyspareunia 05/06/2014  . Dysrhythmia    palpatations  . Essential (primary) hypertension   . Fasting hyperglycemia   . Fibromyalgia   . Fibromyalgia   . Gastroesophageal reflux disease    Chronic abdominal pain; gastroparesis; globus hystericus; irritable bowel syndrome  . Glaucoma   . Hereditary and idiopathic neuropathy, unspecified   . Hyperlipidemia   . Hypertension   . Impaired glucose tolerance   . Irritable bowel syndrome   . Major depressive disorder, single episode, unspecified    . Morbid (severe) obesity due to excess calories ([place])   . Narcotic dependence ([place])   . Nausea   . Nonspecific mesenteric lymphadenitis   . Overweight(278.02)   . Pain in limb   . Pain in right foot   . PTSD (post-traumatic stress disorder)   . Pulmonary nodule   . Restless legs syndrome   . Shortness of breath    with exertion  . Sleep apnea   . Sleep apnea    on CPAP machine  . Sleep apnea, unspecified   . Stroke ([place]) 2016  . Tobacco abuse    one pack per day; 35 pack years  . Type 2 diabetes mellitus with hyperglycemia [facility])    Past Surgical History:  Procedure Laterality Date  . ABDOMINAL HYSTERECTOMY     TAH&BSO  . BACK SURGERY    . BLADDER SUSPENSION  09/12/2011   Procedure: TRANSVAGINAL TAPE (TVT) PROCEDURE;  Surgeon: [name], MD;  Location: [facility];  Service: Urology;  Laterality: N/A;  . CESAREAN SECTION     X3  . CHOLECYSTECTOMY    . ESOPHAGOGASTRODUODENOSCOPY ENDOSCOPY     "throat stretched" per pt.  Marland Kitchen LIVER BIOPSY     x4  . LUMBAR EPIDURAL INJECTION  01/2012  . TOTAL ABDOMINAL HYSTERECTOMY W/ BILATERAL SALPINGOOPHORECTOMY    . TUBAL LIGATION     Bilateral  . UPPER GASTROINTESTINAL ENDOSCOPY  09/13/2010     Family History  Problem Relation Age of Onset  . Diabetes Mother   . Hypertension Mother   . Anxiety disorder Mother   . Depression Mother   . Drug abuse Mother   . Heart disease Father   . Diabetes Father   . Anxiety disorder Father   . Alcohol abuse Father   . Depression Father   . OCD Father   . Thyroid disease Sister   . Healthy Brother   . Healthy Daughter   . Healthy Son   . ADD / ADHD Son   . Alcohol abuse Paternal Grandfather   . Depression Paternal Grandfather   . Cancer Paternal Grandfather        lung,skin  . Tuberculosis Paternal Grandfather   . Seizures Paternal Grandmother   . Lupus Paternal Grandmother   . ADD / ADHD Son   . Anesthesia problems Neg Hx   . Malignant hyperthermia Neg Hx   . Pseudochol  deficiency Neg Hx   . Hypotension Neg Hx   . Bipolar disorder Neg Hx   . Dementia Neg Hx   . Paranoid behavior Neg Hx   . Schizophrenia Neg Hx   . Sexual abuse Neg Hx   . Physical abuse Neg Hx     Social History   Social History Narrative   Divorced since 1994.Lives with boyfriend of 11 years.On disability.   Social History   Tobacco Use  . Smoking status: Former Smoker    Packs/day: 1.50    Years: 25.00    Pack years: 37.50    Types: Cigarettes    Quit date: 04/25/2013    Years since quitting: 6.6  . Smokeless tobacco: Never Used  . Tobacco comment: 15-20 cigs a day as of 11/12/2012  Substance Use Topics  . Alcohol use: No    Alcohol/week: 0.0 standard drinks    Current Meds  Medication Sig  . ammonium lactate (AMLACTIN) 12 % cream Apply topically as needed.   Marland Kitchen amphetamine-dextroamphetamine (ADDERALL) 20 MG tablet Take 1 tablet (20 mg total) by mouth 2 (two) times daily.  . Ascorbic Acid (VITAMIN C) 1000 MG tablet Take 500 mg by mouth every morning.   Marland Kitchen aspirin 325 MG EC tablet Take 325 mg by mouth daily.  Marland Kitchen azaTHIOprine (IMURAN) 50 MG tablet TAKE 1 TABLET(50 MG) BY MOUTH DAILY  . bethanechol (URECHOLINE) 10 MG tablet Take 1 tablet (10 mg total) by mouth 3 (three) times daily before meals.  . bimatoprost (LUMIGAN) 0.01 % SOLN Place 1 drop into both eyes at bedtime.  . calcium-vitamin D (OS-CAL 500 + D) 500-200 MG-UNIT per tablet Take 1 tablet by mouth 2 (two) times daily.   . cycloSPORINE (RESTASIS) 0.05 % ophthalmic emulsion Place 1 drop into both eyes daily.  . DULoxetine (CYMBALTA) 60 MG capsule TAKE 1 CAPSULE(60 MG) BY MOUTH TWICE DAILY  . esomeprazole (NEXIUM) 40 MG capsule TAKE 1 CAPSULE BY MOUTH EVERY MORNING  . fexofenadine (ALLEGRA) 60 MG tablet Take 1 tablet by mouth 2 (two) times daily as needed.  . furosemide (LASIX) 40 MG tablet Take 40 mg by mouth every other day.   . gabapentin (NEURONTIN) 300 MG capsule Take 300 mg by mouth at bedtime.  Marland Kitchen GARLIC PO Take  8,099 mg by mouth daily.   Marland Kitchen glucose blood test strip Use as instructed  . hydrALAZINE (APRESOLINE) 100 MG tablet Take 1 tablet by mouth 4 (four) times daily.  Marland Kitchen ketorolac (ACULAR) 0.4 % SOLN  Place 1 drop into the left eye 4 (four) times daily.  Marland Kitchen levothyroxine (SYNTHROID, LEVOTHROID) 25 MCG tablet Take 25 mcg by mouth daily before breakfast.  . lisinopril (PRINIVIL,ZESTRIL) 40 MG tablet Take 40 mg by mouth daily.   . metFORMIN (GLUCOPHAGE) 500 MG tablet Take 1,000 mg by mouth 2 (two) times daily with a meal.   . metoprolol succinate (TOPROL-XL) 100 MG 24 hr tablet Take 1 tablet (100 mg total) by mouth daily. with food  . mirabegron ER (MYRBETRIQ) 50 MG TB24 tablet Take 1 tablet (50 mg total) by mouth daily.  . Multiple Vitamins-Minerals (CENTRUM) tablet Take 1 tablet by mouth daily.  Marland Kitchen MYRBETRIQ 25 MG TB24 tablet TAKE 1 TABLET(25 MG) BY MOUTH DAILY  . ofloxacin (OCUFLOX) 0.3 % ophthalmic solution Place 1 drop into the left eye 4 (four) times daily.  . ondansetron (ZOFRAN) 4 MG tablet Take 1 tablet (4 mg total) by mouth every 8 (eight) hours as needed for nausea or vomiting.  Glory Rosebush Delica Lancets 67J MISC 1 Units by Does not apply route 2 (two) times daily.  . pramipexole (MIRAPEX) 1 MG tablet Take 2 tablets by mouth at bedtime.  . prazosin (MINIPRESS) 2 MG capsule TAKE 1 CAPSULE(2 MG) BY MOUTH AT BEDTIME  . prednisoLONE acetate (PRED FORTE) 1 % ophthalmic suspension Place 1 drop into the left eye 4 (four) times daily.  Marland Kitchen PROAIR HFA 108 (90 BASE) MCG/ACT inhaler Inhale 1-2 puffs into the lungs every 6 (six) hours as needed for wheezing or shortness of breath.   . Probiotic Product (RESTORA PO) Take by mouth daily.   . promethazine (PHENERGAN) 25 MG tablet Take 1 tablet (25 mg total) by mouth every 6 (six) hours as needed for nausea or vomiting. May cause drowsiness  . rosuvastatin (CRESTOR) 40 MG tablet Take 40 mg by mouth daily.      Depression screen [facility] 2/9 10/20/2019 07/09/2019  07/19/2017  Decreased Interest 0 0 0  Down, Depressed, Hopeless 0 0 1  PHQ - 2 Score 0 0 1  Some recent data might be hidden     Objective:   Today's Vitals: BP 130/90 (BP Location: Left Arm, Patient Position: Sitting, Cuff Size: Normal)   Pulse (!) 118   Temp (!) 97.3 F (36.3 C) (Temporal)   Ht 5' (1.524 m)   Wt 198 lb 3.2 oz (89.9 kg)   SpO2 96%   BMI 38.71 kg/m  Vitals with BMI 12/09/2019 11/05/2019 10/20/2019  Height 5' 0"  5' 0"  -  Weight 198 lbs 3 oz 198 lbs 199 lbs 10 oz  BMI 38.71 44.92 01.00  Systolic 712 197 588  Diastolic 90 87 74  Pulse 325 125 85  Some encounter information is confidential and restricted. Go to Review Flowsheets activity to see all data.     Physical Exam   For her age, she looks older than her stated age.  She is morbidly obese.  Blood pressure seems to be under reasonable control.  She is alert and orientated without any obvious focal neurological signs. Examination of her left arm shows a benign looking 0.5 cm raised brown lesion.  She apparently has this for several months without any change.    Assessment   1. Essential hypertension   2. DM type 2 causing vascular disease ([place])   3. Essential hypertension, benign   4. Autoimmune hepatitis ([place])   5. Acquired hypothyroidism   6. Skin lesion of left arm  Tests ordered Orders Placed This Encounter  Procedures  . Ambulatory referral to Dermatology     Plan: 1. For the skin lesion I will send her to dermatology although I think this is benign.  She may need biopsy. 2. For hypothyroidism, I explained the difference between T3 and T4 and their functions.  We will switch her from levothyroxine to NP thyroid which will provide her with a T3 that will hopefully make her feel better with more energy.  I explained possible side effects. 3. She will continue with antihypertensive therapy as before as this seems to be controlling her blood pressure reasonably well. 4. As far as her  diabetes is concerned, she will continue with Metformin. 5. She is overall the patient with polypharmacy and I will attempt to reduce these medications as time goes forward. 6. Follow-up with me in about 3 weeks time we will do all the blood work.   Meds ordered this encounter  Medications  . thyroid (NP THYROID) 60 MG tablet    Sig: Take 1 tablet (60 mg total) by mouth daily before breakfast.    Dispense:  30 tablet    Refill:  3  . blood glucose meter kit and supplies KIT    Sig: Diagnosis : E11.9  Check blood sugar once a day    Dispense:  1 each    Refill:  0    Order Specific Question:   Number of strips    Answer:   100    Order Specific Question:   Number of lancets    Answer:   100    Junko Ohagan Luther Parody, MD

## 2019-12-11 NOTE — Progress Notes (Deleted)
Patient ID: Lindsay SZWED, female   DOB: 17-Mar-1971, 49 y.o.   MRN: [id number]    HPI: 49 y.o. f/u for hypertension and palpitations with known history of anxiety, tobacco abuse, and fibromyalgia. She has not been seen in 3 years   Multiple somatic complaints She has OSA, but has not been wearing her CPAP because she states it did not fit. She is often tired during the day.   Echo 03/06/15 reviewed  EF normal 55-60% mild MR  Carotid Duplex 03/06/15 reviewed mild plaque no stenosis   She is divorced lives with boyfriend in [place] She is on disability for ? Autoimmune liver disease She has 3 children. Weaned off methadone at [facility] On multiple drugs for anxiety depression night mares and fatigue  History of relatively high HR due to weight and adderall   Had normal ETT 01/02/17  ***  Allergies  Allergen Reactions  . Doxycycline Shortness Of Breath and Rash  . Fentanyl Shortness Of Breath, Itching and Other (See Comments)    Heart racing, SOB  . Amlodipine   . Bupropion   . Cefoxitin   . Ceftin [Cefuroxime Axetil]   . Iodinated Diagnostic Agents Other (See Comments)    Knots on body  . Iohexol Other (See Comments)    Knots on body   . Norvasc [Amlodipine Besylate]   . Wellbutrin [Bupropion Hcl] Other (See Comments)    unknown  . Latex Rash  . Tape Rash    Current Outpatient Medications  Medication Sig Dispense Refill  . ammonium lactate (AMLACTIN) 12 % cream Apply topically as needed.     Marland Kitchen amphetamine-dextroamphetamine (ADDERALL) 20 MG tablet Take 1 tablet (20 mg total) by mouth 2 (two) times daily. 60 tablet 0  . Ascorbic Acid (VITAMIN C) 1000 MG tablet Take 500 mg by mouth every morning.     Marland Kitchen aspirin 325 MG EC tablet Take 325 mg by mouth daily.    Marland Kitchen azaTHIOprine (IMURAN) 50 MG tablet TAKE 1 TABLET(50 MG) BY MOUTH DAILY 30 tablet 5  . bethanechol (URECHOLINE) 10 MG tablet Take 1 tablet (10 mg total) by mouth 3 (three) times daily before meals. 90 tablet 2  .  bimatoprost (LUMIGAN) 0.01 % SOLN Place 1 drop into both eyes at bedtime.    . blood glucose meter kit and supplies KIT Diagnosis : E11.9  Check blood sugar once a day 1 each 0  . calcium-vitamin D (OS-CAL 500 + D) 500-200 MG-UNIT per tablet Take 1 tablet by mouth 2 (two) times daily.     . cycloSPORINE (RESTASIS) 0.05 % ophthalmic emulsion Place 1 drop into both eyes daily.    . DULoxetine (CYMBALTA) 60 MG capsule TAKE 1 CAPSULE(60 MG) BY MOUTH TWICE DAILY 180 capsule 2  . esomeprazole (NEXIUM) 40 MG capsule TAKE 1 CAPSULE BY MOUTH EVERY MORNING 90 capsule 3  . fexofenadine (ALLEGRA) 60 MG tablet Take 1 tablet by mouth 2 (two) times daily as needed.    . furosemide (LASIX) 40 MG tablet Take 40 mg by mouth every other day.     . gabapentin (NEURONTIN) 300 MG capsule Take 300 mg by mouth at bedtime.    Marland Kitchen GARLIC PO Take 5,277 mg by mouth daily.     Marland Kitchen glucose blood test strip Use as instructed 100 each 5  . hydrALAZINE (APRESOLINE) 100 MG tablet Take 1 tablet by mouth 4 (four) times daily.    Marland Kitchen ketorolac (ACULAR) 0.4 % SOLN Place 1 drop into the  left eye 4 (four) times daily.    Marland Kitchen levothyroxine (SYNTHROID, LEVOTHROID) 25 MCG tablet Take 25 mcg by mouth daily before breakfast.    . lisinopril (PRINIVIL,ZESTRIL) 40 MG tablet Take 40 mg by mouth daily.     . metFORMIN (GLUCOPHAGE) 500 MG tablet Take 1,000 mg by mouth 2 (two) times daily with a meal.     . metoprolol succinate (TOPROL-XL) 100 MG 24 hr tablet Take 1 tablet (100 mg total) by mouth daily. with food 30 tablet 0  . mirabegron ER (MYRBETRIQ) 50 MG TB24 tablet Take 1 tablet (50 mg total) by mouth daily. 30 tablet 11  . Multiple Vitamins-Minerals (CENTRUM) tablet Take 1 tablet by mouth daily.    Marland Kitchen MYRBETRIQ 25 MG TB24 tablet TAKE 1 TABLET(25 MG) BY MOUTH DAILY 30 tablet 0  . ofloxacin (OCUFLOX) 0.3 % ophthalmic solution Place 1 drop into the left eye 4 (four) times daily.    . ondansetron (ZOFRAN) 4 MG tablet Take 1 tablet (4 mg total) by  mouth every 8 (eight) hours as needed for nausea or vomiting. 4 tablet 0  . OneTouch Delica Lancets 40J MISC 1 Units by Does not apply route 2 (two) times daily. 100 each 1  . pramipexole (MIRAPEX) 1 MG tablet Take 2 tablets by mouth at bedtime.    . prazosin (MINIPRESS) 2 MG capsule TAKE 1 CAPSULE(2 MG) BY MOUTH AT BEDTIME 90 capsule 2  . prednisoLONE acetate (PRED FORTE) 1 % ophthalmic suspension Place 1 drop into the left eye 4 (four) times daily.    Marland Kitchen PROAIR HFA 108 (90 BASE) MCG/ACT inhaler Inhale 1-2 puffs into the lungs every 6 (six) hours as needed for wheezing or shortness of breath.     . Probiotic Product (RESTORA PO) Take by mouth daily.     . promethazine (PHENERGAN) 25 MG tablet Take 1 tablet (25 mg total) by mouth every 6 (six) hours as needed for nausea or vomiting. May cause drowsiness 12 tablet 0  . rosuvastatin (CRESTOR) 40 MG tablet Take 40 mg by mouth daily.    Marland Kitchen thyroid (NP THYROID) 60 MG tablet Take 1 tablet (60 mg total) by mouth daily before breakfast. 30 tablet 3   No current facility-administered medications for this visit.    Past Medical History:  Diagnosis Date  . Acid reflux   . Allergic rhinitis due to pollen   . Angina    took SL nitro one week ago  . Anxiety   . Arthritis    back  . Arthritis   . Autoimmune hepatitis ([place])   . Back pain   . Chest pain    Normal stress echo in 2011; PVCs; pedal edema  . CVA (cerebral infarction)   . Depression   . Depression   . Diabetes mellitus without complication ([place])   . Dizziness and giddiness   . Dyspareunia 05/06/2014  . Dysrhythmia    palpatations  . Essential (primary) hypertension   . Fasting hyperglycemia   . Fibromyalgia   . Fibromyalgia   . Gastroesophageal reflux disease    Chronic abdominal pain; gastroparesis; globus hystericus; irritable bowel syndrome  . Glaucoma   . Hereditary and idiopathic neuropathy, unspecified   . Hyperlipidemia   . Hypertension   . Impaired glucose tolerance     . Irritable bowel syndrome   . Major depressive disorder, single episode, unspecified   . Morbid (severe) obesity due to excess calories ([place])   . Narcotic dependence ([place])   .  Nausea   . Nonspecific mesenteric lymphadenitis   . Overweight(278.02)   . Pain in limb   . Pain in right foot   . PTSD (post-traumatic stress disorder)   . Pulmonary nodule   . Restless legs syndrome   . Shortness of breath    with exertion  . Sleep apnea   . Sleep apnea    on CPAP machine  . Sleep apnea, unspecified   . Stroke ([place]) 2016  . Tobacco abuse    one pack per day; 35 pack years  . Type 2 diabetes mellitus with hyperglycemia [facility])     Past Surgical History:  Procedure Laterality Date  . ABDOMINAL HYSTERECTOMY     TAH&BSO  . BACK SURGERY    . BLADDER SUSPENSION  09/12/2011   Procedure: TRANSVAGINAL TAPE (TVT) PROCEDURE;  Surgeon: [name], MD;  Location: [facility];  Service: Urology;  Laterality: N/A;  . CESAREAN SECTION     X3  . CHOLECYSTECTOMY    . ESOPHAGOGASTRODUODENOSCOPY ENDOSCOPY     "throat stretched" per pt.  Marland Kitchen LIVER BIOPSY     x4  . LUMBAR EPIDURAL INJECTION  01/2012  . TOTAL ABDOMINAL HYSTERECTOMY W/ BILATERAL SALPINGOOPHORECTOMY    . TUBAL LIGATION     Bilateral  . UPPER GASTROINTESTINAL ENDOSCOPY  09/13/2010    ROS: Review of systems complete and found to be negative unless listed above  There were no vitals taken for this visit.  PHYSICAL EXAM Affect appropriate Obese white female  HEENT: normal Neck supple with no adenopathy JVP normal no bruits no thyromegaly Lungs clear with no wheezing and good diaphragmatic motion Heart:  S1/S2SEM  murmur, no rub, gallop or click PMI normal Abdomen: benighn, BS positve, no tenderness, no AAA no bruit.  No HSM or HJR Distal pulses intact with no bruits No edema Neuro non-focal Skin warm and dry tattoos  No muscular weakness   EKG:  2015  NSR with non-specific T-wave abnormalities. Rate of  75  bpm.  ASSESSMENT AND PLAN  Chest Pain: atypical normal myovue 03/21/12  Normal ETT 01/02/17 observe  Palpitations: resolved likely due to nicotine and adderall with HTN heart disease continue beta blocker  HTN: improved so long as she is compliant with meds   Anxiety: improved continue cymbalta Smoking: former smoker *** Fibromyalgia:  On imuran sees [facility] really needs some simplification of her med list Thyroid:  On replacement labs/TSH with primary Changed to NP Thyroid by Anastasio Champion 12/09/19   FU PRN    Jenkins Rouge

## 2019-12-16 ENCOUNTER — Other Ambulatory Visit: Payer: Self-pay

## 2019-12-16 ENCOUNTER — Telehealth (INDEPENDENT_AMBULATORY_CARE_PROVIDER_SITE_OTHER): Payer: Medicare Other | Admitting: Psychiatry

## 2019-12-16 ENCOUNTER — Encounter (HOSPITAL_COMMUNITY): Payer: Self-pay | Admitting: Psychiatry

## 2019-12-16 DIAGNOSIS — F4310 Post-traumatic stress disorder, unspecified: Secondary | ICD-10-CM | POA: Diagnosis not present

## 2019-12-16 DIAGNOSIS — F321 Major depressive disorder, single episode, moderate: Secondary | ICD-10-CM | POA: Diagnosis not present

## 2019-12-16 DIAGNOSIS — F900 Attention-deficit hyperactivity disorder, predominantly inattentive type: Secondary | ICD-10-CM

## 2019-12-16 DIAGNOSIS — F515 Nightmare disorder: Secondary | ICD-10-CM

## 2019-12-16 MED ORDER — PRAZOSIN HCL 2 MG PO CAPS: ORAL_CAPSULE | ORAL | 2 refills | Status: DC

## 2019-12-16 MED ORDER — AMPHETAMINE-DEXTROAMPHETAMINE 20 MG PO TABS
20.0000 mg | ORAL_TABLET | Freq: Two times a day (BID) | ORAL | 0 refills | Status: DC
Start: 2019-12-16 — End: 2020-01-26

## 2019-12-16 MED ORDER — DULOXETINE HCL 60 MG PO CPEP: ORAL_CAPSULE | ORAL | 2 refills | Status: DC

## 2019-12-16 MED ORDER — AMPHETAMINE-DEXTROAMPHETAMINE 20 MG PO TABS: 20.0000 mg | ORAL_TABLET | Freq: Two times a day (BID) | ORAL | 0 refills | Status: DC
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
## 2019-12-18 ENCOUNTER — Ambulatory Visit: Payer: Medicare Other | Admitting: Cardiovascular Disease

## 2019-12-24 ENCOUNTER — Other Ambulatory Visit: Payer: Self-pay | Admitting: Cardiovascular Disease

## 2019-12-31 ENCOUNTER — Other Ambulatory Visit: Payer: Self-pay

## 2019-12-31 ENCOUNTER — Encounter (INDEPENDENT_AMBULATORY_CARE_PROVIDER_SITE_OTHER): Payer: Self-pay | Admitting: Internal Medicine

## 2019-12-31 ENCOUNTER — Ambulatory Visit (INDEPENDENT_AMBULATORY_CARE_PROVIDER_SITE_OTHER): Payer: Medicare Other | Admitting: Internal Medicine

## 2019-12-31 VITALS — BP 115/80 | HR 87 | Temp 97.1°F | Ht 60.0 in | Wt 196.4 lb

## 2019-12-31 DIAGNOSIS — E039 Hypothyroidism, unspecified: Secondary | ICD-10-CM | POA: Diagnosis not present

## 2019-12-31 DIAGNOSIS — E1159 Type 2 diabetes mellitus with other circulatory complications: Secondary | ICD-10-CM | POA: Diagnosis not present

## 2019-12-31 DIAGNOSIS — I10 Essential (primary) hypertension: Secondary | ICD-10-CM | POA: Diagnosis not present

## 2019-12-31 NOTE — Progress Notes (Signed)
Metrics: Intervention Frequency ACO  Documented Smoking Status Yearly  Screened one or more times in 24 months  Cessation Counseling or  Active cessation medication Past 24 months  Past 24 months   Guideline developer: UpToDate (See UpToDate for funding source) Date Released: 2014       Wellness Office Visit  Subjective:  Patient ID: Lindsay Walsh, female    DOB: 1970/09/26  Age: 49 y.o. MRN: [id number]  CC: This lady comes in for follow-up of hypothyroidism, diabetes, obesity, hypertension. HPI  Since starting desiccated NP thyroid, she feels better, has more energy and her outlook is improved. She continues to take Metformin twice a day for diabetes.  Her last hemoglobin A1c back in October 2020 was 7.2%. She continues on ACE inhibitor lisinopril and metoprolol for hypertension. She also continues on statin therapy for hyperlipidemia in the face of cerebrovascular disease, diabetes. She is under the care of [place] [facility] and is being investigated for palpitations I believe and has a monitor at the present time in place.  Past Medical History:  Diagnosis Date  . Acid reflux   . Allergic rhinitis due to pollen   . Angina    took SL nitro one week ago  . Anxiety   . Arthritis    back  . Arthritis   . Autoimmune hepatitis ([place])   . Back pain   . Chest pain    Normal stress echo in 2011; PVCs; pedal edema  . CVA (cerebral infarction)   . Depression   . Depression   . Diabetes mellitus without complication ([place])   . Dizziness and giddiness   . Dyspareunia 05/06/2014  . Dysrhythmia    palpatations  . Essential (primary) hypertension   . Fasting hyperglycemia   . Fibromyalgia   . Fibromyalgia   . Gastroesophageal reflux disease    Chronic abdominal pain; gastroparesis; globus hystericus; irritable bowel syndrome  . Glaucoma   . Hereditary and idiopathic neuropathy, unspecified   . Hyperlipidemia   . Hypertension   . Impaired glucose tolerance   . Irritable bowel  syndrome   . Major depressive disorder, single episode, unspecified   . Morbid (severe) obesity due to excess calories ([place])   . Narcotic dependence ([place])   . Nausea   . Nonspecific mesenteric lymphadenitis   . Overweight(278.02)   . Pain in limb   . Pain in right foot   . PTSD (post-traumatic stress disorder)   . Pulmonary nodule   . Restless legs syndrome   . Shortness of breath    with exertion  . Sleep apnea   . Sleep apnea    on CPAP machine  . Sleep apnea, unspecified   . Stroke ([place]) 2016  . Tobacco abuse    one pack per day; 35 pack years  . Type 2 diabetes mellitus with hyperglycemia [facility])    Past Surgical History:  Procedure Laterality Date  . ABDOMINAL HYSTERECTOMY     TAH&BSO  . BACK SURGERY    . BLADDER SUSPENSION  09/12/2011   Procedure: TRANSVAGINAL TAPE (TVT) PROCEDURE;  Surgeon: [name], MD;  Location: [facility];  Service: Urology;  Laterality: N/A;  . CESAREAN SECTION     X3  . CHOLECYSTECTOMY    . ESOPHAGOGASTRODUODENOSCOPY ENDOSCOPY     "throat stretched" per pt.  Marland Kitchen LIVER BIOPSY     x4  . LUMBAR EPIDURAL INJECTION  01/2012  . TOTAL ABDOMINAL HYSTERECTOMY W/ BILATERAL SALPINGOOPHORECTOMY    . TUBAL LIGATION  Bilateral  . UPPER GASTROINTESTINAL ENDOSCOPY  09/13/2010     Family History  Problem Relation Age of Onset  . Diabetes Mother   . Hypertension Mother   . Anxiety disorder Mother   . Depression Mother   . Drug abuse Mother   . Heart disease Father   . Diabetes Father   . Anxiety disorder Father   . Alcohol abuse Father   . Depression Father   . OCD Father   . Thyroid disease Sister   . Healthy Brother   . Healthy Daughter   . Healthy Son   . ADD / ADHD Son   . Alcohol abuse Paternal Grandfather   . Depression Paternal Grandfather   . Cancer Paternal Grandfather        lung,skin  . Tuberculosis Paternal Grandfather   . Seizures Paternal Grandmother   . Lupus Paternal Grandmother   . ADD / ADHD Son   . Anesthesia  problems Neg Hx   . Malignant hyperthermia Neg Hx   . Pseudochol deficiency Neg Hx   . Hypotension Neg Hx   . Bipolar disorder Neg Hx   . Dementia Neg Hx   . Paranoid behavior Neg Hx   . Schizophrenia Neg Hx   . Sexual abuse Neg Hx   . Physical abuse Neg Hx     Social History   Social History Narrative   Divorced since 1994.Lives with boyfriend of 11 years.On disability.   Social History   Tobacco Use  . Smoking status: Former Smoker    Packs/day: 1.50    Years: 25.00    Pack years: 37.50    Types: Cigarettes    Quit date: 04/25/2013    Years since quitting: 6.6  . Smokeless tobacco: Never Used  . Tobacco comment: 15-20 cigs a day as of 11/12/2012  Substance Use Topics  . Alcohol use: No    Alcohol/week: 0.0 standard drinks    Current Meds  Medication Sig  . ammonium lactate (AMLACTIN) 12 % cream Apply topically as needed.   Marland Kitchen amphetamine-dextroamphetamine (ADDERALL) 20 MG tablet Take 1 tablet (20 mg total) by mouth 2 (two) times daily.  Marland Kitchen amphetamine-dextroamphetamine (ADDERALL) 20 MG tablet Take 1 tablet (20 mg total) by mouth 2 (two) times daily.  . Ascorbic Acid (VITAMIN C) 1000 MG tablet Take 500 mg by mouth every morning.   Marland Kitchen aspirin 325 MG EC tablet Take 325 mg by mouth daily.  Marland Kitchen azaTHIOprine (IMURAN) 50 MG tablet TAKE 1 TABLET(50 MG) BY MOUTH DAILY  . bethanechol (URECHOLINE) 10 MG tablet Take 1 tablet (10 mg total) by mouth 3 (three) times daily before meals.  . bimatoprost (LUMIGAN) 0.01 % SOLN Place 1 drop into both eyes at bedtime.  . blood glucose meter kit and supplies KIT Diagnosis : E11.9  Check blood sugar once a day  . calcium-vitamin D (OS-CAL 500 + D) 500-200 MG-UNIT per tablet Take 1 tablet by mouth 2 (two) times daily.   . cycloSPORINE (RESTASIS) 0.05 % ophthalmic emulsion Place 1 drop into both eyes daily.  . DULoxetine (CYMBALTA) 60 MG capsule TAKE 1 CAPSULE(60 MG) BY MOUTH TWICE DAILY  . esomeprazole (NEXIUM) 40 MG capsule TAKE 1 CAPSULE BY  MOUTH EVERY MORNING  . fexofenadine (ALLEGRA) 60 MG tablet Take 1 tablet by mouth 2 (two) times daily as needed.  . furosemide (LASIX) 40 MG tablet Take 40 mg by mouth every other day.   . gabapentin (NEURONTIN) 300 MG capsule Take 300 mg by mouth  at bedtime.  Marland Kitchen GARLIC PO Take 0,354 mg by mouth daily.   Marland Kitchen glucose blood test strip Use as instructed  . hydrALAZINE (APRESOLINE) 100 MG tablet Take 1 tablet by mouth 4 (four) times daily.  Marland Kitchen ketorolac (ACULAR) 0.4 % SOLN Place 1 drop into the left eye 4 (four) times daily.  Marland Kitchen lisinopril (PRINIVIL,ZESTRIL) 40 MG tablet Take 40 mg by mouth daily.   . metFORMIN (GLUCOPHAGE) 500 MG tablet Take 1,000 mg by mouth 2 (two) times daily with a meal.   . metoprolol succinate (TOPROL-XL) 100 MG 24 hr tablet TAKE 1 TABLET(100 MG) BY MOUTH DAILY WITH FOOD  . mirabegron ER (MYRBETRIQ) 50 MG TB24 tablet Take 1 tablet (50 mg total) by mouth daily.  . Multiple Vitamins-Minerals (CENTRUM) tablet Take 1 tablet by mouth daily.  Marland Kitchen MYRBETRIQ 25 MG TB24 tablet TAKE 1 TABLET(25 MG) BY MOUTH DAILY  . ofloxacin (OCUFLOX) 0.3 % ophthalmic solution Place 1 drop into the left eye 4 (four) times daily.  . ondansetron (ZOFRAN) 4 MG tablet Take 1 tablet (4 mg total) by mouth every 8 (eight) hours as needed for nausea or vomiting.  Glory Rosebush Delica Lancets 65K MISC 1 Units by Does not apply route 2 (two) times daily.  . pramipexole (MIRAPEX) 1 MG tablet Take 2 tablets by mouth at bedtime.  . prazosin (MINIPRESS) 2 MG capsule TAKE 1 CAPSULE(2 MG) BY MOUTH AT BEDTIME  . prednisoLONE acetate (PRED FORTE) 1 % ophthalmic suspension Place 1 drop into the left eye 4 (four) times daily.  Marland Kitchen PROAIR HFA 108 (90 BASE) MCG/ACT inhaler Inhale 1-2 puffs into the lungs every 6 (six) hours as needed for wheezing or shortness of breath.   . Probiotic Product (RESTORA PO) Take by mouth daily.   . promethazine (PHENERGAN) 25 MG tablet Take 1 tablet (25 mg total) by mouth every 6 (six) hours as needed  for nausea or vomiting. May cause drowsiness  . rosuvastatin (CRESTOR) 40 MG tablet Take 40 mg by mouth daily.  Marland Kitchen thyroid (NP THYROID) 60 MG tablet Take 1 tablet (60 mg total) by mouth daily before breakfast.  . [DISCONTINUED] levothyroxine (SYNTHROID, LEVOTHROID) 25 MCG tablet Take 25 mcg by mouth daily before breakfast.       Depression screen [facility] 2/9 10/20/2019 07/09/2019 07/19/2017  Decreased Interest 0 0 0  Down, Depressed, Hopeless 0 0 1  PHQ - 2 Score 0 0 1  Some recent data might be hidden     Objective:   Today's Vitals: BP 115/80 (BP Location: Left Arm, Patient Position: Sitting, Cuff Size: Normal)   Pulse 87   Temp (!) 97.1 F (36.2 C) (Temporal)   Ht 5' (1.524 m)   Wt 196 lb 6.4 oz (89.1 kg)   SpO2 98%   BMI 38.36 kg/m  Vitals with BMI 12/31/2019 12/09/2019 11/05/2019  Height 5' 0"  5' 0"  5' 0"   Weight 196 lbs 6 oz 198 lbs 3 oz 198 lbs  BMI 38.36 81.27 51.70  Systolic 017 494 496  Diastolic 80 90 87  Pulse 87 118 125  Some encounter information is confidential and restricted. Go to Review Flowsheets activity to see all data.     Physical Exam   She looks systemically well.  Her weight is down by about 2 pounds since the last time I saw her.  Blood pressure is excellent.  She is alert and orientated without any focal neurological signs.    Assessment   1. DM type 2 causing  vascular disease ([place])   2. Essential hypertension   3. Acquired hypothyroidism   4. Morbid obesity ([place])       Tests ordered Orders Placed This Encounter  Procedures  . COMPLETE METABOLIC PANEL WITH GFR  . Hemoglobin A1c  . T3, free  . T4  . TSH     Plan: 1. Blood work is ordered. 2. She will continue with current dose of Metformin and I will check A1c today. 3. She will continue with the same dose of desiccated NP thyroid and we will check thyroid function test today and see if we need to adjust it further. 4. She will continue with lisinopril and metoprolol for  hypertension which seems to be controlling her blood pressure well. 5. As far as her morbid obesity is concerned, we discussed more nutrition today and I introduced the concept of intermittent fasting combined with more of a plant-based diet.  I explained the rationale and I think she will start doing this soon. 6. Follow-up in 4 weeks and further recommendations will depend on blood results.   No orders of the defined types were placed in this encounter.   Doree Albee, MD

## 2019-12-31 NOTE — Patient Instructions (Signed)
[facility] Dietary Recommendations for Weight Loss What to Avoid . Avoid added sugars o Often added sugar can be found in processed foods such as many condiments, dry cereals, cakes, cookies, chips, crisps, crackers, candies, sweetened drinks, etc.  o Read labels and AVOID/DECREASE use of foods with the following in their ingredient list: Sugar, fructose, high fructose corn syrup, sucrose, glucose, maltose, dextrose, molasses, cane sugar, brown sugar, any type of syrup, agave nectar, etc.   . Avoid snacking in between meals . Avoid foods made with flour o If you are going to eat food made with flour, choose those made with whole-grains; and, minimize your consumption as much as is tolerable . Avoid processed foods o These foods are generally stocked in the middle of the grocery store. Focus on shopping on the perimeter of the grocery.  . Avoid Meat  o We recommend following a plant-based diet at [facility]. Thus, we recommend avoiding meat as a general rule. Consider eating beans, legumes, eggs, and/or dairy products for regular protein sources o If you plan on eating meat limit to 4 ounces of meat at a time and choose lean options such as Fish, chicken, turkey. Avoid red meat intake such as pork and/or steak What to Include . Vegetables o GREEN LEAFY VEGETABLES: Kale, spinach, mustard greens, collard greens, cabbage, broccoli, etc. o OTHER: Asparagus, cauliflower, eggplant, carrots, peas, Brussel sprouts, tomatoes, bell peppers, zucchini, beets, cucumbers, etc. . Grains, seeds, and legumes o Beans: kidney beans, black eyed peas, garbanzo beans, black beans, pinto beans, etc. o Whole, unrefined grains: brown rice, barley, bulgur, oatmeal, etc. . Healthy fats  o Avoid highly processed fats such as vegetable oil o Examples of healthy fats: avocado, olives, virgin olive oil, dark chocolate (?72% Cocoa), nuts (peanuts, almonds, walnuts, cashews, pecans, etc.) . None to Low  Intake of Animal Sources of Protein o Meat sources: chicken, turkey, salmon, tuna. Limit to 4 ounces of meat at one time. o Consider limiting dairy sources, but when choosing dairy focus on: PLAIN Greek yogurt, cottage cheese, high-protein milk . Fruit o Choose berries  When to Eat . Intermittent Fasting: o Choosing not to eat for a specific time period, but DO FOCUS ON HYDRATION when fasting o Multiple Techniques: - Time Restricted Eating: eat 3 meals in a day, each meal lasting no more than 60 minutes, no snacks between meals - 16-18 hour fast: fast for 16 to 18 hours up to 7 days a week. Often suggested to start with 2-3 nonconsecutive days per week.  . Remember the time you sleep is counted as fasting.  . Examples of eating schedule: Fast from 7:00pm-11:00am. Eat between 11:00am-7:00pm.  - 24-hour fast: fast for 24 hours up to every other day. Often suggested to start with 1 day per week . Remember the time you sleep is counted as fasting . Examples of eating schedule:  o Eating day: eat 2-3 meals on your eating day. If doing 2 meals, each meal should last no more than 90 minutes. If doing 3 meals, each meal should last no more than 60 minutes. Finish last meal by 7:00pm. o Fasting day: Fast until 7:00pm.  o IF YOU FEEL UNWELL FOR ANY REASON/IN ANY WAY WHEN FASTING, STOP FASTING BY EATING A NUTRITIOUS SNACK OR LIGHT MEAL o ALWAYS FOCUS ON HYDRATION DURING FASTS - Acceptable Hydration sources: water, broths, tea/coffee (black tea/coffee is best but using a small amount of whole-fat dairy products in coffee/tea is acceptable).  -   Poor Hydration Sources: anything with sugar or artificial sweeteners added to it  These recommendations have been developed for patients that are actively receiving medical care from either Dr. Roderick Calo or Sarah Gray, DNP, NP-C at [name]. These recommendations are developed for patients with specific medical conditions and are not meant to be  distributed or used by others that are not actively receiving care from either provider listed above at [name]. It is not appropriate to participate in the above eating plans without proper medical supervision.   Reference: Fung, J. The obesity code. [place]: [place]; 2016.   

## 2020-01-08 ENCOUNTER — Ambulatory Visit: Payer: Medicare Other | Admitting: Family Medicine

## 2020-01-21 ENCOUNTER — Other Ambulatory Visit (INDEPENDENT_AMBULATORY_CARE_PROVIDER_SITE_OTHER): Payer: Self-pay | Admitting: Internal Medicine

## 2020-01-22 LAB — T4: T4, Total: 7.9 ug/dL (ref 4.5–12.0)

## 2020-01-22 LAB — COMPREHENSIVE METABOLIC PANEL
ALT: 22 IU/L (ref 0–32)
AST: 20 IU/L (ref 0–40)
Albumin/Globulin Ratio: 1.7 (ref 1.2–2.2)
Albumin: 4 g/dL (ref 3.8–4.8)
Alkaline Phosphatase: 108 IU/L (ref 48–121)
BUN/Creatinine Ratio: 11 (ref 9–23)
BUN: 8 mg/dL (ref 6–24)
Bilirubin Total: 0.2 mg/dL (ref 0.0–1.2)
CO2: 28 mmol/L (ref 20–29)
Calcium: 9.4 mg/dL (ref 8.7–10.2)
Chloride: 101 mmol/L (ref 96–106)
Creatinine, Ser: 0.71 mg/dL (ref 0.57–1.00)
GFR calc Af Amer: 116 mL/min/{1.73_m2} (ref >59)
GFR calc non Af Amer: 100 mL/min/{1.73_m2} (ref >59)
Globulin, Total: 2.4 g/dL (ref 1.5–4.5)
Glucose: 172 mg/dL — ABNORMAL HIGH (ref 65–99)
Potassium: 4.6 mmol/L (ref 3.5–5.2)
Sodium: 140 mmol/L (ref 134–144)
Total Protein: 6.4 g/dL (ref 6.0–8.5)

## 2020-01-22 LAB — HGB A1C W/O EAG: Hgb A1c MFr Bld: 7.7 % — ABNORMAL HIGH (ref 4.8–5.6)

## 2020-01-22 LAB — TSH: TSH: 1.55 u[IU]/mL (ref 0.450–4.500)

## 2020-01-22 LAB — T3, FREE: T3, Free: 4.5 pg/mL — ABNORMAL HIGH (ref 2.0–4.4)

## 2020-01-22 LAB — SPECIMEN STATUS REPORT

## 2020-01-25 ENCOUNTER — Emergency Department (HOSPITAL_COMMUNITY)
Admission: EM | Admit: 2020-01-25 | Discharge: 2020-01-26 | Disposition: A | Discharge status: Home / Self Care | Payer: Medicare Other | Attending: Emergency Medicine | Admitting: Emergency Medicine

## 2020-01-25 ENCOUNTER — Encounter (HOSPITAL_COMMUNITY): Payer: Self-pay | Admitting: *Deleted

## 2020-01-25 ENCOUNTER — Other Ambulatory Visit (INDEPENDENT_AMBULATORY_CARE_PROVIDER_SITE_OTHER): Payer: Self-pay | Admitting: Internal Medicine

## 2020-01-25 ENCOUNTER — Other Ambulatory Visit: Payer: Self-pay

## 2020-01-25 ENCOUNTER — Telehealth (INDEPENDENT_AMBULATORY_CARE_PROVIDER_SITE_OTHER): Payer: Self-pay | Admitting: Internal Medicine

## 2020-01-25 DIAGNOSIS — Z79899 Other long term (current) drug therapy: Secondary | ICD-10-CM | POA: Insufficient documentation

## 2020-01-25 DIAGNOSIS — E119 Type 2 diabetes mellitus without complications: Secondary | ICD-10-CM | POA: Diagnosis not present

## 2020-01-25 DIAGNOSIS — R112 Nausea with vomiting, unspecified: Secondary | ICD-10-CM | POA: Insufficient documentation

## 2020-01-25 DIAGNOSIS — I10 Essential (primary) hypertension: Secondary | ICD-10-CM | POA: Diagnosis not present

## 2020-01-25 DIAGNOSIS — E039 Hypothyroidism, unspecified: Secondary | ICD-10-CM | POA: Insufficient documentation

## 2020-01-25 DIAGNOSIS — Z7984 Long term (current) use of oral hypoglycemic drugs: Secondary | ICD-10-CM | POA: Diagnosis not present

## 2020-01-25 DIAGNOSIS — Z91040 Latex allergy status: Secondary | ICD-10-CM | POA: Insufficient documentation

## 2020-01-25 DIAGNOSIS — Z7982 Long term (current) use of aspirin: Secondary | ICD-10-CM | POA: Insufficient documentation

## 2020-01-25 DIAGNOSIS — Z87891 Personal history of nicotine dependence: Secondary | ICD-10-CM | POA: Insufficient documentation

## 2020-01-25 LAB — CBC WITH DIFFERENTIAL/PLATELET
Abs Immature Granulocytes: 0.03 10*3/uL (ref 0.00–0.07)
Basophils Absolute: 0.1 10*3/uL (ref 0.0–0.1)
Basophils Relative: 1 %
Eosinophils Absolute: 0.1 10*3/uL (ref 0.0–0.5)
Eosinophils Relative: 1 %
HCT: 43.2 % (ref 36.0–46.0)
Hemoglobin: 14.5 g/dL (ref 12.0–15.0)
Immature Granulocytes: 0 %
Lymphocytes Relative: 10 %
Lymphs Abs: 1.2 10*3/uL (ref 0.7–4.0)
MCH: 30.6 pg (ref 26.0–34.0)
MCHC: 33.6 g/dL (ref 30.0–36.0)
MCV: 91.1 fL (ref 80.0–100.0)
Monocytes Absolute: 0.8 10*3/uL (ref 0.1–1.0)
Monocytes Relative: 7 %
Neutro Abs: 9.6 10*3/uL — ABNORMAL HIGH (ref 1.7–7.7)
Neutrophils Relative %: 81 %
Platelets: 316 10*3/uL (ref 150–400)
RBC: 4.74 MIL/uL (ref 3.87–5.11)
RDW: 12.5 % (ref 11.5–15.5)
WBC: 11.8 10*3/uL — ABNORMAL HIGH (ref 4.0–10.5)
nRBC: 0 % (ref 0.0–0.2)

## 2020-01-25 LAB — COMPREHENSIVE METABOLIC PANEL
ALT: 25 U/L (ref 0–44)
AST: 24 U/L (ref 15–41)
Albumin: 4.4 g/dL (ref 3.5–5.0)
Alkaline Phosphatase: 107 U/L (ref 38–126)
Anion gap: 11 (ref 5–15)
BUN: 13 mg/dL (ref 6–20)
CO2: 25 mmol/L (ref 22–32)
Calcium: 8.9 mg/dL (ref 8.9–10.3)
Chloride: 103 mmol/L (ref 98–111)
Creatinine, Ser: 0.68 mg/dL (ref 0.44–1.00)
GFR calc Af Amer: >60 mL/min (ref >60)
GFR calc non Af Amer: >60 mL/min (ref >60)
Glucose, Bld: 183 mg/dL — ABNORMAL HIGH (ref 70–99)
Potassium: 4 mmol/L (ref 3.5–5.1)
Sodium: 139 mmol/L (ref 135–145)
Total Bilirubin: 0.8 mg/dL (ref 0.3–1.2)
Total Protein: 8.2 g/dL — ABNORMAL HIGH (ref 6.5–8.1)

## 2020-01-25 LAB — LIPASE, BLOOD: Lipase: 32 U/L (ref 11–51)

## 2020-01-25 MED ORDER — METOCLOPRAMIDE HCL 5 MG/ML IJ SOLN
10.0000 mg | Freq: Once | INTRAMUSCULAR | Status: AC
  Administered 2020-01-25: 10 mg via INTRAVENOUS
  Filled 2020-01-25: qty 2

## 2020-01-25 MED ORDER — SODIUM CHLORIDE 0.9 % IV BOLUS
1000.0000 mL | Freq: Once | INTRAVENOUS | Status: AC
  Administered 2020-01-25: 1000 mL via INTRAVENOUS

## 2020-01-25 MED ORDER — DIPHENHYDRAMINE HCL 50 MG/ML IJ SOLN
12.5000 mg | Freq: Once | INTRAMUSCULAR | Status: AC
  Administered 2020-01-25: 12.5 mg via INTRAVENOUS
  Filled 2020-01-25: qty 1

## 2020-01-25 MED ORDER — FAMOTIDINE IN NACL 20-0.9 MG/50ML-% IV SOLN
20.0000 mg | Freq: Once | INTRAVENOUS | Status: AC
  Administered 2020-01-25: 20 mg via INTRAVENOUS
  Filled 2020-01-25: qty 50

## 2020-01-25 MED ORDER — HALOPERIDOL LACTATE 5 MG/ML IJ SOLN
2.0000 mg | Freq: Once | INTRAMUSCULAR | Status: AC
  Administered 2020-01-25: 2 mg via INTRAVENOUS
  Filled 2020-01-25: qty 1

## 2020-01-25 NOTE — ED Triage Notes (Signed)
Patient reports she was advised by [name] by telephone to come to the ED for evaluation after calling about vomiting multiple times today.

## 2020-01-25 NOTE — ED Notes (Signed)
Pt reports she has Gastroparesis and was advised to come to ED by PCP for K+ check and multiple episodes of emesis. Pt having multiple episodes of emesis while this RN was attempting assessment.

## 2020-01-25 NOTE — ED Notes (Signed)
ED Provider at bedside. 

## 2020-01-25 NOTE — Telephone Encounter (Signed)
Patient's daughter [place] called in stating her mother was having nausea vomiting and diarrhea.  She was not able to keep anything down. Patient has multiple medical problems including diabetic gastroparesis.  Unfortunately her multiple medications are not helping either. She is on bethanechol 10 mg 3 times a day. Patient advised to come to emergency room for evaluation.  ER physician informed.

## 2020-01-25 NOTE — ED Provider Notes (Signed)
- [facility] Provider Note   CSN: [id number] Arrival date & time: 01/25/20  1732     History Chief Complaint  Patient presents with  . Emesis    Lindsay Walsh is a 49 y.o. female with a history of T2DM with gastroparesis, prior stroke, hypertension, fibromyalgia, GERD, IBS, chronic abdominal pain, narcotic dependence, autoimmune hepatitis, and prior tubal ligation & hysterectomy who presents to the ED with complaints of N/V that began this AM. Patient reports nausea with approximately 5 episode of emesis throughout the day today. She cannot keep anything down. Abdomen feels sore from vomiting but no significant pain. No other alleviating/aggravating factors. Feels like her prior gastroparesis flares. Denies fever, chills, hematemesis, melena. Hematochezia, constipation, dysuria, or vaginal bleeding/discharge. Last BM today mildly loose. Patient does not utilize marijuana.   HPI     Past Medical History:  Diagnosis Date  . Acid reflux   . Allergic rhinitis due to pollen   . Angina    took SL nitro one week ago  . Anxiety   . Arthritis    back  . Arthritis   . Autoimmune hepatitis ([place])   . Back pain   . Chest pain    Normal stress echo in 2011; PVCs; pedal edema  . CVA (cerebral infarction)   . Depression   . Depression   . Diabetes mellitus without complication ([place])   . Dizziness and giddiness   . Dyspareunia 05/06/2014  . Dysrhythmia    palpatations  . Essential (primary) hypertension   . Fasting hyperglycemia   . Fibromyalgia   . Fibromyalgia   . Gastroesophageal reflux disease    Chronic abdominal pain; gastroparesis; globus hystericus; irritable bowel syndrome  . Glaucoma   . Hereditary and idiopathic neuropathy, unspecified   . Hyperlipidemia   . Hypertension   . Impaired glucose tolerance   . Irritable bowel syndrome   . Major depressive disorder, single episode, unspecified   . Morbid (severe) obesity due to excess calories ([place])    . Narcotic dependence ([place])   . Nausea   . Nonspecific mesenteric lymphadenitis   . Overweight(278.02)   . Pain in limb   . Pain in right foot   . PTSD (post-traumatic stress disorder)   . Pulmonary nodule   . Restless legs syndrome   . Shortness of breath    with exertion  . Sleep apnea   . Sleep apnea    on CPAP machine  . Sleep apnea, unspecified   . Stroke ([place]) 2016  . Tobacco abuse    one pack per day; 35 pack years  . Type 2 diabetes mellitus with hyperglycemia [facility])     Patient Active Problem List   Diagnosis Date Noted  . OAB (overactive bladder) 11/05/2019  . Recurrent UTI 11/05/2019  . Diabetic gastroparesis ([place]) 08/21/2019  . Nausea and vomiting 07/15/2019  . Diarrhea 07/15/2019  . Abnormal finding on urinalysis 07/15/2019  . Morbid (severe) obesity due to excess calories ([place])   . Essential hypertension   . Major depressive disorder, single episode, unspecified   . Restless legs syndrome   . Sleep apnea   . Fibromyalgia   . Hereditary and idiopathic neuropathy, unspecified   . Nonspecific mesenteric lymphadenitis   . Pain in right foot   . Type 2 diabetes mellitus with hyperglycemia ([place])   . Hypothyroidism 08/22/2018  . DM type 2 causing vascular disease ([place]) 08/21/2018  . Mixed hyperlipidemia 08/21/2018  . Essential hypertension, benign 08/21/2018  .  Influenza A 08/10/2018  . SIRS (systemic inflammatory response syndrome) ([place]) 08/10/2018  . Essential hypertension, malignant 03/07/2015  . Cerebral infarction ([place]) 03/05/2015  . Dyspareunia 05/06/2014  . Obesity 09/30/2013  . Bilateral hip pain 07/16/2013  . Chronic pain syndrome 07/16/2013  . Degenerative disc disease, lumbar 07/16/2013  . Greater trochanteric bursitis of both hips 07/16/2013  . Nightmares associated with chronic post-traumatic stress disorder 10/18/2012  . Sprain of foot 03/14/2012  . Weakness 01/29/2012  . Hepatitis, autoimmune ([place]) 01/29/2012  . IBS (irritable bowel  syndrome) 08/01/2011  . Gastroesophageal reflux disease   . Hypertension   . Depression   . Chest pain   . Fasting hyperglycemia   . TOBACCO ABUSE 04/28/2010  . NARCOTIC ABUSE 04/28/2010  . PULMONARY NODULE 04/28/2010  . Autoimmune hepatitis ([place]) 04/28/2010  . Myalgia and myositis 01/06/2009    Past Surgical History:  Procedure Laterality Date  . ABDOMINAL HYSTERECTOMY     TAH&BSO  . BACK SURGERY    . BLADDER SUSPENSION  09/12/2011   Procedure: TRANSVAGINAL TAPE (TVT) PROCEDURE;  Surgeon: [name], MD;  Location: [facility];  Service: Urology;  Laterality: N/A;  . CESAREAN SECTION     X3  . CHOLECYSTECTOMY    . ESOPHAGOGASTRODUODENOSCOPY ENDOSCOPY     "throat stretched" per pt.  Marland Kitchen LIVER BIOPSY     x4  . LUMBAR EPIDURAL INJECTION  01/2012  . TOTAL ABDOMINAL HYSTERECTOMY W/ BILATERAL SALPINGOOPHORECTOMY    . TUBAL LIGATION     Bilateral  . UPPER GASTROINTESTINAL ENDOSCOPY  09/13/2010     OB History    Gravida  3   Para  3   Term      Preterm      AB      Living  3     SAB      TAB      Ectopic      Multiple      Live Births              Family History  Problem Relation Age of Onset  . Diabetes Mother   . Hypertension Mother   . Anxiety disorder Mother   . Depression Mother   . Drug abuse Mother   . Heart disease Father   . Diabetes Father   . Anxiety disorder Father   . Alcohol abuse Father   . Depression Father   . OCD Father   . Thyroid disease Sister   . Healthy Brother   . Healthy Daughter   . Healthy Son   . ADD / ADHD Son   . Alcohol abuse Paternal Grandfather   . Depression Paternal Grandfather   . Cancer Paternal Grandfather        lung,skin  . Tuberculosis Paternal Grandfather   . Seizures Paternal Grandmother   . Lupus Paternal Grandmother   . ADD / ADHD Son   . Anesthesia problems Neg Hx   . Malignant hyperthermia Neg Hx   . Pseudochol deficiency Neg Hx   . Hypotension Neg Hx   . Bipolar disorder Neg Hx   .  Dementia Neg Hx   . Paranoid behavior Neg Hx   . Schizophrenia Neg Hx   . Sexual abuse Neg Hx   . Physical abuse Neg Hx     Social History   Tobacco Use  . Smoking status: Former Smoker    Packs/day: 1.50    Years: 25.00    Pack years: 37.50    Types:  Cigarettes    Quit date: 04/25/2013    Years since quitting: 6.7  . Smokeless tobacco: Never Used  . Tobacco comment: 15-20 cigs a day as of 11/12/2012  Vaping Use  . Vaping Use: Never used  Substance Use Topics  . Alcohol use: No    Alcohol/week: 0.0 standard drinks  . Drug use: No    Home Medications Prior to Admission medications   Medication Sig Start Date End Date Taking? Authorizing Provider  ammonium lactate (AMLACTIN) 12 % cream Apply topically as needed.  12/14/16   [provider]  amphetamine-dextroamphetamine (ADDERALL) 20 MG tablet Take 1 tablet (20 mg total) by mouth 2 (two) times daily. 12/16/19 12/15/20  Cloria Spring, MD  amphetamine-dextroamphetamine (ADDERALL) 20 MG tablet Take 1 tablet (20 mg total) by mouth 2 (two) times daily. 12/16/19 12/15/20  Cloria Spring, MD  Ascorbic Acid (VITAMIN C) 1000 MG tablet Take 500 mg by mouth every morning.     [provider]  aspirin 325 MG EC tablet Take 325 mg by mouth daily.    [provider]  azaTHIOprine (IMURAN) 50 MG tablet TAKE 1 TABLET(50 MG) BY MOUTH DAILY 12/03/19   Laurine Blazer A, PA-C  bethanechol (URECHOLINE) 10 MG tablet Take 1 tablet (10 mg total) by mouth 3 (three) times daily before meals. 08/21/19   [name], Mechele Dawley, MD  bimatoprost (LUMIGAN) 0.01 % SOLN Place 1 drop into both eyes at bedtime.    [provider]  blood glucose meter kit and supplies KIT Diagnosis : E11.9  Check blood sugar once a day 12/09/19   Hurshel Party C, MD  calcium-vitamin D (OS-CAL 500 + D) 500-200 MG-UNIT per tablet Take 1 tablet by mouth 2 (two) times daily.     [provider]  cycloSPORINE (RESTASIS) 0.05 % ophthalmic emulsion Place 1  drop into both eyes daily.    [provider]  DULoxetine (CYMBALTA) 60 MG capsule TAKE 1 CAPSULE(60 MG) BY MOUTH TWICE DAILY 12/16/19   Cloria Spring, MD  esomeprazole (NEXIUM) 40 MG capsule TAKE 1 CAPSULE BY MOUTH EVERY MORNING 09/09/18   [name], Mechele Dawley, MD  fexofenadine (ALLEGRA) 60 MG tablet Take 1 tablet by mouth 2 (two) times daily as needed. 06/27/10   [provider]  furosemide (LASIX) 40 MG tablet Take 40 mg by mouth every other day.     [provider]  gabapentin (NEURONTIN) 300 MG capsule Take 300 mg by mouth at bedtime.    [provider]  GARLIC PO Take 4,431 mg by mouth daily.     [provider]  glucose blood test strip Use as instructed 11/18/19   Corum, Rex Kras, MD  hydrALAZINE (APRESOLINE) 100 MG tablet Take 1 tablet by mouth 4 (four) times daily. 04/28/19   [provider]  ketorolac (ACULAR) 0.4 % SOLN Place 1 drop into the left eye 4 (four) times daily. 09/19/19   [provider]  lisinopril (PRINIVIL,ZESTRIL) 40 MG tablet Take 40 mg by mouth daily.  09/23/12   [provider]  metFORMIN (GLUCOPHAGE) 500 MG tablet Take 1,000 mg by mouth 2 (two) times daily with a meal.  05/11/19   [provider]  metoprolol succinate (TOPROL-XL) 100 MG 24 hr tablet TAKE 1 TABLET(100 MG) BY MOUTH DAILY WITH FOOD 12/25/19   Josue Hector, MD  mirabegron ER (MYRBETRIQ) 50 MG TB24 tablet Take 1 tablet (50 mg total) by mouth daily. 11/05/19   McKenzie, Candee Furbish,  MD  Multiple Vitamins-Minerals (CENTRUM) tablet Take 1 tablet by mouth daily.    [provider]  MYRBETRIQ 25 MG TB24 tablet TAKE 1 TABLET(25 MG) BY MOUTH DAILY 11/13/19   McKenzie, Candee Furbish, MD  ofloxacin (OCUFLOX) 0.3 % ophthalmic solution Place 1 drop into the left eye 4 (four) times daily. 09/19/19   [provider]  ondansetron (ZOFRAN) 4 MG tablet Take 1 tablet (4 mg total) by mouth every 8 (eight) hours as needed for nausea or vomiting.  07/06/19   Domenic Moras, PA-C  OneTouch Delica Lancets 82C MISC 1 Units by Does not apply route 2 (two) times daily. 11/18/19   Corum, Rex Kras, MD  pramipexole (MIRAPEX) 1 MG tablet Take 2 tablets by mouth at bedtime. 04/28/19   [provider]  prazosin (MINIPRESS) 2 MG capsule TAKE 1 CAPSULE(2 MG) BY MOUTH AT BEDTIME 12/16/19   Cloria Spring, MD  prednisoLONE acetate (PRED FORTE) 1 % ophthalmic suspension Place 1 drop into the left eye 4 (four) times daily. 09/19/19   [provider]  PROAIR HFA 108 (90 BASE) MCG/ACT inhaler Inhale 1-2 puffs into the lungs every 6 (six) hours as needed for wheezing or shortness of breath.  12/16/12   [provider]  Probiotic Product (RESTORA PO) Take by mouth daily.     [provider]  promethazine (PHENERGAN) 25 MG tablet Take 1 tablet (25 mg total) by mouth every 6 (six) hours as needed for nausea or vomiting. May cause drowsiness 04/05/19   Triplett, Tammy, PA-C  rosuvastatin (CRESTOR) 40 MG tablet Take 40 mg by mouth daily. 04/30/19   [provider]  thyroid (NP THYROID) 60 MG tablet Take 1 tablet (60 mg total) by mouth daily before breakfast. 12/09/19   Hurshel Party C, MD    Allergies    Doxycycline, Fentanyl, Amlodipine, Bupropion, Cefoxitin, Ceftin [cefuroxime axetil], Iodinated diagnostic agents, Iohexol, Norvasc [amlodipine besylate], Wellbutrin [bupropion hcl], Latex, and Tape  Review of Systems   Review of Systems  Constitutional: Negative for chills and fever.  Respiratory: Negative for cough and shortness of breath.   Cardiovascular: Negative for chest pain.  Gastrointestinal: Positive for diarrhea (loose stool x 1 today), nausea and vomiting. Negative for abdominal pain, anal bleeding, blood in stool and constipation.  Genitourinary: Negative for dysuria, vaginal bleeding and vaginal discharge.  Neurological: Negative for syncope and speech difficulty.  All other systems reviewed and are  negative.   Physical Exam Updated Vital Signs BP 123/73 (BP Location: Right Arm)   Pulse (!) 102   Temp 98.3 F (36.8 C) (Oral)   Resp 18   Ht 5' (1.524 m)   Wt 88.5 kg   SpO2 97%   BMI 38.08 kg/m   Physical Exam Vitals and nursing note reviewed.  Constitutional:      General: She is not in acute distress.    Appearance: She is well-developed. She is not toxic-appearing.  HENT:     Head: Normocephalic and atraumatic.     Mouth/Throat:     Mouth: Mucous membranes are dry.  Eyes:     General:        Right eye: No discharge.        Left eye: No discharge.     Conjunctiva/sclera: Conjunctivae normal.  Cardiovascular:     Rate and Rhythm: Regular rhythm. Tachycardia present.  Pulmonary:     Effort: Pulmonary effort is normal. No respiratory distress.     Breath sounds: Normal breath sounds. No  wheezing, rhonchi or rales.  Abdominal:     General: There is no distension.     Palpations: Abdomen is soft.     Tenderness: There is no abdominal tenderness. There is no right CVA tenderness, left CVA tenderness, guarding or rebound.  Musculoskeletal:     Cervical back: Neck supple.  Skin:    General: Skin is warm and dry.     Findings: No rash.  Neurological:     Mental Status: She is alert.     Comments: Clear speech.   Psychiatric:        Behavior: Behavior normal.    ED Results / Procedures / Treatments   Labs (all labs ordered are listed, but only abnormal results are displayed) Labs Reviewed  CBC WITH DIFFERENTIAL/PLATELET - Abnormal; Notable for the following components:      Result Value   WBC 11.8 (*)    Neutro Abs 9.6 (*)    All other components within normal limits  COMPREHENSIVE METABOLIC PANEL - Abnormal; Notable for the following components:   Glucose, Bld 183 (*)    Total Protein 8.2 (*)    All other components within normal limits  LIPASE, BLOOD  URINALYSIS, ROUTINE W REFLEX MICROSCOPIC    EKG None  Radiology No results  found.  Procedures Procedures (including critical care time)  Medications Ordered in ED Medications - No data to display  ED Course  I have reviewed the triage vital signs and the nursing notes.  Pertinent labs & imaging results that were available during my care of the patient were reviewed by me and considered in my medical decision making (see chart for details).    MDM Rules/Calculators/A&P                         Patient presents to the ED with complaints of N/V today that feels like her gastroparesis.  Mild tachycardia noted, vitals otherwise unremarkable.  Abdomen nontender w/o peritoneal signs.  DDX: Gastroparesis flare, GERD, PUD, pancreatitis, cholecystitis, cholelithiasis, viral GI illness.  Will give reglan, benadryl, fluids, pepcid & re-assess.   Additional history obtained:  Additional history obtained from chart & nursing note review.  Lab Tests:  I Ordered, reviewed, and interpreted labs, which included:  CBC: Mild leukocytosis @ 11.8 felt to be nonspecific. No anemia.  CMP: Mild hyperglycemia. No significant electrolyte derangement. Renal function & LFTs WNL.  Lipase: WNL UA: Pending.   22:00: RE-EVAL: Patient feeling improved, will PO challenge. Repeat abdominal exam remains without peritoneal signs.   22:50: Initially patient feeling better, however unfortunately had episode of emesis following PO challenge, will give haldol & additional fluids, discussed w/ supervising physician Dr. Eulis Foster who is in agreement.   23:05: Patient care signed out to Dr. Roxanne Mins @ change of shift pending re-assessment & disposition.   Portions of this note were generated with [employer]. Dictation errors may occur despite best attempts at proofreading.  Final Clinical Impression(s) / ED Diagnoses Final diagnoses:  None    Rx / DC Orders ED Discharge Orders    None       Amaryllis Dyke, PA-C 01/25/20 2312    Daleen Bo, MD 01/26/20 1137

## 2020-01-26 NOTE — Discharge Instructions (Addendum)
Return if you are having any problems. 

## 2020-01-26 NOTE — ED Provider Notes (Signed)
Care assumed from [facility], PA-C, patient with nausea and vomiting presumably secondary to gastroparesis.  She was signed out to me following injection of haloperidol.  Following this, she states she is feeling much better and is doing well enough to go home.  She is discharged with instructions to follow-up with her PCP and with her gastroenterologist.   Delora Fuel, MD 15/86/82 0104

## 2020-01-28 ENCOUNTER — Other Ambulatory Visit: Payer: Self-pay

## 2020-01-28 ENCOUNTER — Ambulatory Visit (INDEPENDENT_AMBULATORY_CARE_PROVIDER_SITE_OTHER): Payer: Medicare Other | Admitting: Internal Medicine

## 2020-01-28 ENCOUNTER — Encounter (INDEPENDENT_AMBULATORY_CARE_PROVIDER_SITE_OTHER): Payer: Self-pay | Admitting: Internal Medicine

## 2020-01-28 VITALS — BP 146/90 | HR 79 | Temp 97.5°F | Resp 17 | Ht 60.0 in | Wt 195.0 lb

## 2020-01-28 DIAGNOSIS — E039 Hypothyroidism, unspecified: Secondary | ICD-10-CM

## 2020-01-28 DIAGNOSIS — E1159 Type 2 diabetes mellitus with other circulatory complications: Secondary | ICD-10-CM

## 2020-01-28 DIAGNOSIS — I10 Essential (primary) hypertension: Secondary | ICD-10-CM

## 2020-01-28 NOTE — Patient Instructions (Signed)
[facility] Dietary Recommendations for Weight Loss What to Avoid . Avoid added sugars o Often added sugar can be found in processed foods such as many condiments, dry cereals, cakes, cookies, chips, crisps, crackers, candies, sweetened drinks, etc.  o Read labels and AVOID/DECREASE use of foods with the following in their ingredient list: Sugar, fructose, high fructose corn syrup, sucrose, glucose, maltose, dextrose, molasses, cane sugar, brown sugar, any type of syrup, agave nectar, etc.   . Avoid snacking in between meals . Avoid foods made with flour o If you are going to eat food made with flour, choose those made with whole-grains; and, minimize your consumption as much as is tolerable . Avoid processed foods o These foods are generally stocked in the middle of the grocery store. Focus on shopping on the perimeter of the grocery.  . Avoid Meat  o We recommend following a plant-based diet at [facility]. Thus, we recommend avoiding meat as a general rule. Consider eating beans, legumes, eggs, and/or dairy products for regular protein sources o If you plan on eating meat limit to 4 ounces of meat at a time and choose lean options such as Fish, chicken, turkey. Avoid red meat intake such as pork and/or steak What to Include . Vegetables o GREEN LEAFY VEGETABLES: Kale, spinach, mustard greens, collard greens, cabbage, broccoli, etc. o OTHER: Asparagus, cauliflower, eggplant, carrots, peas, Brussel sprouts, tomatoes, bell peppers, zucchini, beets, cucumbers, etc. . Grains, seeds, and legumes o Beans: kidney beans, black eyed peas, garbanzo beans, black beans, pinto beans, etc. o Whole, unrefined grains: brown rice, barley, bulgur, oatmeal, etc. . Healthy fats  o Avoid highly processed fats such as vegetable oil o Examples of healthy fats: avocado, olives, virgin olive oil, dark chocolate (?72% Cocoa), nuts (peanuts, almonds, walnuts, cashews, pecans, etc.) . None to Low  Intake of Animal Sources of Protein o Meat sources: chicken, turkey, salmon, tuna. Limit to 4 ounces of meat at one time. o Consider limiting dairy sources, but when choosing dairy focus on: PLAIN Greek yogurt, cottage cheese, high-protein milk . Fruit o Choose berries  When to Eat . Intermittent Fasting: o Choosing not to eat for a specific time period, but DO FOCUS ON HYDRATION when fasting o Multiple Techniques: - Time Restricted Eating: eat 3 meals in a day, each meal lasting no more than 60 minutes, no snacks between meals - 16-18 hour fast: fast for 16 to 18 hours up to 7 days a week. Often suggested to start with 2-3 nonconsecutive days per week.  . Remember the time you sleep is counted as fasting.  . Examples of eating schedule: Fast from 7:00pm-11:00am. Eat between 11:00am-7:00pm.  - 24-hour fast: fast for 24 hours up to every other day. Often suggested to start with 1 day per week . Remember the time you sleep is counted as fasting . Examples of eating schedule:  o Eating day: eat 2-3 meals on your eating day. If doing 2 meals, each meal should last no more than 90 minutes. If doing 3 meals, each meal should last no more than 60 minutes. Finish last meal by 7:00pm. o Fasting day: Fast until 7:00pm.  o IF YOU FEEL UNWELL FOR ANY REASON/IN ANY WAY WHEN FASTING, STOP FASTING BY EATING A NUTRITIOUS SNACK OR LIGHT MEAL o ALWAYS FOCUS ON HYDRATION DURING FASTS - Acceptable Hydration sources: water, broths, tea/coffee (black tea/coffee is best but using a small amount of whole-fat dairy products in coffee/tea is acceptable).  -   Poor Hydration Sources: anything with sugar or artificial sweeteners added to it  These recommendations have been developed for patients that are actively receiving medical care from either Dr. Albina Walsh or Lindsay Gray, DNP, NP-C at [facility]. These recommendations are developed for patients with specific medical conditions and are not meant to be  distributed or used by others that are not actively receiving care from either provider listed above at [facility]. It is not appropriate to participate in the above eating plans without proper medical supervision.   Reference: Fung, J. The obesity code. [place]: [place]; 2016.   

## 2020-01-28 NOTE — Progress Notes (Signed)
Metrics: Intervention Frequency ACO  Documented Smoking Status Yearly  Screened one or more times in 24 months  Cessation Counseling or  Active cessation medication Past 24 months  Past 24 months   Guideline developer: UpToDate (See UpToDate for funding source) Date Released: 2014       Wellness Office Visit  Subjective:  Patient ID: Lindsay Walsh, female    DOB: September 13, 1970  Age: 49 y.o. MRN: [id number]  CC: This lady comes in for follow-up of hypertension, diabetes, hypothyroidism and obesity. HPI  Unfortunately, her gastroparesis has been a problem recently and she went to the emergency room.  Her gastroenterologist has started her on new medication for this and hopefully this will help her.  In the meantime, because of her gastroparesis symptoms, she has not been consistent with nutrition that we discussed previously. Since being on desiccated NP thyroid for hypothyroidism, she says that she has more energy and feels better overall. I reviewed her blood results with her which show a good range T3 of 4.5, unfortunately, hemoglobin A1c 7.7%. She continues on Metformin for diabetes and continues on lisinopril and metoprolol for her hypertension. Past Medical History:  Diagnosis Date  . Acid reflux   . Allergic rhinitis due to pollen   . Angina    took SL nitro one week ago  . Anxiety   . Arthritis    back  . Arthritis   . Autoimmune hepatitis ([place])   . Back pain   . Chest pain    Normal stress echo in 2011; PVCs; pedal edema  . CVA (cerebral infarction)   . Depression   . Depression   . Diabetes mellitus without complication ([place])   . Dizziness and giddiness   . Dyspareunia 05/06/2014  . Dysrhythmia    palpatations  . Essential (primary) hypertension   . Fasting hyperglycemia   . Fibromyalgia   . Fibromyalgia   . Gastroesophageal reflux disease    Chronic abdominal pain; gastroparesis; globus hystericus; irritable bowel syndrome  . Glaucoma   . Hereditary and  idiopathic neuropathy, unspecified   . Hyperlipidemia   . Hypertension   . Impaired glucose tolerance   . Irritable bowel syndrome   . Major depressive disorder, single episode, unspecified   . Morbid (severe) obesity due to excess calories ([place])   . Narcotic dependence ([place])   . Nausea   . Nonspecific mesenteric lymphadenitis   . Overweight(278.02)   . Pain in limb   . Pain in right foot   . PTSD (post-traumatic stress disorder)   . Pulmonary nodule   . Restless legs syndrome   . Shortness of breath    with exertion  . Sleep apnea   . Sleep apnea    on CPAP machine  . Sleep apnea, unspecified   . Stroke ([place]) 2016  . Tobacco abuse    one pack per day; 35 pack years  . Type 2 diabetes mellitus with hyperglycemia [facility])    Past Surgical History:  Procedure Laterality Date  . ABDOMINAL HYSTERECTOMY     TAH&BSO  . BACK SURGERY    . BLADDER SUSPENSION  09/12/2011   Procedure: TRANSVAGINAL TAPE (TVT) PROCEDURE;  Surgeon: [name], MD;  Location: [facility];  Service: Urology;  Laterality: N/A;  . CESAREAN SECTION     X3  . CHOLECYSTECTOMY    . ESOPHAGOGASTRODUODENOSCOPY ENDOSCOPY     "throat stretched" per pt.  Marland Kitchen LIVER BIOPSY     x4  . LUMBAR EPIDURAL INJECTION  01/2012  . TOTAL ABDOMINAL HYSTERECTOMY W/ BILATERAL SALPINGOOPHORECTOMY    . TUBAL LIGATION     Bilateral  . UPPER GASTROINTESTINAL ENDOSCOPY  09/13/2010     Family History  Problem Relation Age of Onset  . Diabetes Mother   . Hypertension Mother   . Anxiety disorder Mother   . Depression Mother   . Drug abuse Mother   . Heart disease Father   . Diabetes Father   . Anxiety disorder Father   . Alcohol abuse Father   . Depression Father   . OCD Father   . Thyroid disease Sister   . Healthy Brother   . Healthy Daughter   . Healthy Son   . ADD / ADHD Son   . Alcohol abuse Paternal Grandfather   . Depression Paternal Grandfather   . Cancer Paternal Grandfather        lung,skin  . Tuberculosis  Paternal Grandfather   . Seizures Paternal Grandmother   . Lupus Paternal Grandmother   . ADD / ADHD Son   . Anesthesia problems Neg Hx   . Malignant hyperthermia Neg Hx   . Pseudochol deficiency Neg Hx   . Hypotension Neg Hx   . Bipolar disorder Neg Hx   . Dementia Neg Hx   . Paranoid behavior Neg Hx   . Schizophrenia Neg Hx   . Sexual abuse Neg Hx   . Physical abuse Neg Hx     Social History   Social History Narrative   Divorced since 1994.Lives with boyfriend of 11 years.On disability.   Social History   Tobacco Use  . Smoking status: Former Smoker    Packs/day: 1.50    Years: 25.00    Pack years: 37.50    Types: Cigarettes    Quit date: 04/25/2013    Years since quitting: 6.7  . Smokeless tobacco: Never Used  . Tobacco comment: 15-20 cigs a day as of 11/12/2012  Substance Use Topics  . Alcohol use: No    Alcohol/week: 0.0 standard drinks    Current Meds  Medication Sig  . ammonium lactate (AMLACTIN) 12 % cream Apply topically as needed for dry skin.   Marland Kitchen amphetamine-dextroamphetamine (ADDERALL) 20 MG tablet Take 1 tablet (20 mg total) by mouth 2 (two) times daily.  . Ascorbic Acid (VITAMIN C) 1000 MG tablet Take 500 mg by mouth every morning.   Marland Kitchen aspirin 325 MG EC tablet Take 325 mg by mouth daily.  Marland Kitchen azaTHIOprine (IMURAN) 50 MG tablet TAKE 1 TABLET(50 MG) BY MOUTH DAILY (Patient taking differently: Take 50 mg by mouth daily. )  . bethanechol (URECHOLINE) 10 MG tablet Take 1 tablet (10 mg total) by mouth 3 (three) times daily before meals.  . bimatoprost (LUMIGAN) 0.01 % SOLN Place 1 drop into both eyes at bedtime.  . calcium-vitamin D (OS-CAL 500 + D) 500-200 MG-UNIT per tablet Take 1 tablet by mouth 2 (two) times daily.   . cycloSPORINE (RESTASIS) 0.05 % ophthalmic emulsion Place 1 drop into both eyes daily.  . DULoxetine (CYMBALTA) 60 MG capsule TAKE 1 CAPSULE(60 MG) BY MOUTH TWICE DAILY (Patient taking differently: Take 60 mg by mouth 2 (two) times daily. )  .  esomeprazole (NEXIUM) 40 MG capsule TAKE 1 CAPSULE BY MOUTH EVERY MORNING (Patient taking differently: Take 40 mg by mouth in the morning. )  . fexofenadine (ALLEGRA) 60 MG tablet Take 1 tablet by mouth 2 (two) times daily as needed for allergies.   . furosemide (LASIX) 40 MG  tablet Take 40 mg by mouth every other day.   . gabapentin (NEURONTIN) 300 MG capsule Take 300 mg by mouth at bedtime.  Marland Kitchen GARLIC PO Take 8,250 mg by mouth daily.   . hydrALAZINE (APRESOLINE) 100 MG tablet Take 100 mg by mouth 4 (four) times daily.   Marland Kitchen ketorolac (ACULAR) 0.4 % SOLN Place 1 drop into the left eye 4 (four) times daily.  Marland Kitchen lisinopril (PRINIVIL,ZESTRIL) 40 MG tablet Take 40 mg by mouth daily.   . metFORMIN (GLUCOPHAGE) 500 MG tablet Take 500 mg by mouth daily with breakfast.   . metoprolol succinate (TOPROL-XL) 100 MG 24 hr tablet TAKE 1 TABLET(100 MG) BY MOUTH DAILY WITH FOOD (Patient taking differently: Take 100 mg by mouth daily. )  . mirabegron ER (MYRBETRIQ) 50 MG TB24 tablet Take 1 tablet (50 mg total) by mouth daily.  . Multiple Vitamins-Minerals (CENTRUM) tablet Take 1 tablet by mouth daily.  Marland Kitchen ofloxacin (OCUFLOX) 0.3 % ophthalmic solution Place 1 drop into the left eye 4 (four) times daily.  . ondansetron (ZOFRAN) 4 MG tablet Take 1 tablet (4 mg total) by mouth every 8 (eight) hours as needed for nausea or vomiting.  . prazosin (MINIPRESS) 2 MG capsule TAKE 1 CAPSULE(2 MG) BY MOUTH AT BEDTIME (Patient taking differently: Take 2 mg by mouth at bedtime. )  . PROAIR HFA 108 (90 BASE) MCG/ACT inhaler Inhale 1-2 puffs into the lungs every 6 (six) hours as needed for wheezing or shortness of breath.   . Probiotic Product (RESTORA PO) Take by mouth daily.   Marland Kitchen thyroid (NP THYROID) 60 MG tablet Take 1 tablet (60 mg total) by mouth daily before breakfast.      Depression screen [facility] 2/9 10/20/2019 07/09/2019 07/19/2017  Decreased Interest 0 0 0  Down, Depressed, Hopeless 0 0 1  PHQ - 2 Score 0 0 1  Some recent  data might be hidden     Objective:   Today's Vitals: BP (!) 146/90 (BP Location: Right Arm, Patient Position: Sitting, Cuff Size: Normal)   Pulse 79   Temp (!) 97.5 F (36.4 C)   Resp 17   Wt 195 lb (88.5 kg)   BMI 38.08 kg/m  Vitals with BMI 01/28/2020 01/26/2020 01/25/2020  Height - - -  Weight 195 lbs - -  BMI 53.97 - -  Systolic 673 419 379  Diastolic 90 74 024  Pulse 79 88 100  Some encounter information is confidential and restricted. Go to Review Flowsheets activity to see all data.     Physical Exam  She looks systemically well.  She has lost just about a pound since last time I saw her.  Blood pressure elevated today because of the symptoms she is currently experiencing I suspect.     Assessment   1. Essential hypertension   2. DM type 2 causing vascular disease ([place])   3. Acquired hypothyroidism       Tests ordered No orders of the defined types were placed in this encounter.    Plan: 1. She will continue with lisinopril and metoprolol for hypertension for the time being. 2. She will continue with Metformin for diabetes and I told her that once her gastroparesis improves, she should be more consistent with nutrition that we discussed.  I have given her diet sheet. 3. She will continue with current dose of NP thyroid as this seems to be getting optimal T3 levels and symptomatic improvement. 4. Follow-up in 3 months.   No orders  of the defined types were placed in this encounter.   Doree Albee, MD

## 2020-02-01 ENCOUNTER — Encounter: Payer: Self-pay | Admitting: *Deleted

## 2020-02-01 ENCOUNTER — Other Ambulatory Visit: Payer: Self-pay

## 2020-02-01 ENCOUNTER — Ambulatory Visit
Admission: EM | Admit: 2020-02-01 | Discharge: 2020-02-01 | Disposition: A | Discharge status: Home / Self Care | Payer: Medicare Other | Attending: Emergency Medicine | Admitting: Emergency Medicine

## 2020-02-01 DIAGNOSIS — M79641 Pain in right hand: Secondary | ICD-10-CM | POA: Diagnosis not present

## 2020-02-01 MED ORDER — PREDNISONE 20 MG PO TABS: 20.0000 mg | ORAL_TABLET | Freq: Two times a day (BID) | ORAL | 0 refills | Status: AC

## 2020-02-01 NOTE — ED Triage Notes (Signed)
Pt c/o right hand pain x 1.5 wks without injury.  States pain progressively worsening.  C/O numbness to portion of hand near right knuckle.  All fingers warm, pink with prompt cap refill.  Pt states her BP and HR are high because her cat was just killed today.

## 2020-02-01 NOTE — ED Provider Notes (Signed)
[place]   [id number] 02/01/20 Arrival Time: 42  CC: Hand PAIN  SUBJECTIVE: History from: patient. Lindsay Walsh is a 49 y.o. female complains of RT hand pain x 1.5 weeks.  Denies a precipitating event or specific injury.  Localizes the pain to the back or RT hand.  Describes the pain as constant, throbbing, and achy in character.  Has tried OTC medications without relief.  Symptoms are made worse with ROM.  Denies similar symptoms in the past.  Complains of associated swelling.  Denies fever, chills, erythema, ecchymosis, weakness, numbness and tingling.    ROS: As per HPI.  All other pertinent ROS negative.     Past Medical History:  Diagnosis Date  . Acid reflux   . Allergic rhinitis due to pollen   . Angina    took SL nitro one week ago  . Anxiety   . Arthritis    back  . Arthritis   . Autoimmune hepatitis ([place])   . Back pain   . Chest pain    Normal stress echo in 2011; PVCs; pedal edema  . CVA (cerebral infarction)   . Depression   . Depression   . Diabetes mellitus without complication ([place])   . Dizziness and giddiness   . Dyspareunia 05/06/2014  . Dysrhythmia    palpatations  . Essential (primary) hypertension   . Fasting hyperglycemia   . Fibromyalgia   . Fibromyalgia   . Gastroesophageal reflux disease    Chronic abdominal pain; gastroparesis; globus hystericus; irritable bowel syndrome  . Glaucoma   . Hereditary and idiopathic neuropathy, unspecified   . Hyperlipidemia   . Hypertension   . Impaired glucose tolerance   . Irritable bowel syndrome   . Major depressive disorder, single episode, unspecified   . Morbid (severe) obesity due to excess calories ([place])   . Narcotic dependence ([place])   . Nausea   . Nonspecific mesenteric lymphadenitis   . Overweight(278.02)   . Pain in limb   . Pain in right foot   . PTSD (post-traumatic stress disorder)   . Pulmonary nodule   . Restless legs syndrome   . Shortness of breath    with exertion   . Sleep apnea   . Sleep apnea    on CPAP machine  . Sleep apnea, unspecified   . Stroke ([place]) 2016  . Tobacco abuse    one pack per day; 35 pack years  . Type 2 diabetes mellitus with hyperglycemia [facility])    Past Surgical History:  Procedure Laterality Date  . ABDOMINAL HYSTERECTOMY     TAH&BSO  . BACK SURGERY    . BLADDER SUSPENSION  09/12/2011   Procedure: TRANSVAGINAL TAPE (TVT) PROCEDURE;  Surgeon: [name], MD;  Location: [facility];  Service: Urology;  Laterality: N/A;  . CESAREAN SECTION     X3  . CHOLECYSTECTOMY    . ESOPHAGOGASTRODUODENOSCOPY ENDOSCOPY     "throat stretched" per pt.  Marland Kitchen LIVER BIOPSY     x4  . LUMBAR EPIDURAL INJECTION  01/2012  . TOTAL ABDOMINAL HYSTERECTOMY W/ BILATERAL SALPINGOOPHORECTOMY    . TUBAL LIGATION     Bilateral  . UPPER GASTROINTESTINAL ENDOSCOPY  09/13/2010   Allergies  Allergen Reactions  . Doxycycline Shortness Of Breath and Rash  . Fentanyl Shortness Of Breath, Itching and Other (See Comments)    Heart racing, SOB  . Amlodipine     Unknown reaction  . Bupropion     Unknown reaction  .  Cefoxitin     Unknown reaction  . Ceftin [Cefuroxime Axetil]     Unknown reaction  . Iodinated Diagnostic Agents Other (See Comments)    Knots on body  . Iohexol Other (See Comments)    Knots on body   . Norvasc [Amlodipine Besylate]   . Wellbutrin [Bupropion Hcl] Other (See Comments)    unknown  . Latex Rash  . Tape Rash   No current facility-administered medications on file prior to encounter.   Current Outpatient Medications on File Prior to Encounter  Medication Sig Dispense Refill  . amphetamine-dextroamphetamine (ADDERALL) 20 MG tablet Take 1 tablet (20 mg total) by mouth 2 (two) times daily. 60 tablet 0  . Ascorbic Acid (VITAMIN C) 1000 MG tablet Take 500 mg by mouth every morning.     Marland Kitchen aspirin 325 MG EC tablet Take 325 mg by mouth daily.    Marland Kitchen azaTHIOprine (IMURAN) 50 MG tablet TAKE 1 TABLET(50 MG) BY MOUTH DAILY (Patient  taking differently: Take 50 mg by mouth daily. ) 30 tablet 5  . bethanechol (URECHOLINE) 10 MG tablet Take 1 tablet (10 mg total) by mouth 3 (three) times daily before meals. 90 tablet 2  . bimatoprost (LUMIGAN) 0.01 % SOLN Place 1 drop into both eyes at bedtime.    . calcium-vitamin D (OS-CAL 500 + D) 500-200 MG-UNIT per tablet Take 1 tablet by mouth 2 (two) times daily.     . cycloSPORINE (RESTASIS) 0.05 % ophthalmic emulsion Place 1 drop into both eyes daily.    . DULoxetine (CYMBALTA) 60 MG capsule TAKE 1 CAPSULE(60 MG) BY MOUTH TWICE DAILY (Patient taking differently: Take 60 mg by mouth 2 (two) times daily. ) 180 capsule 2  . esomeprazole (NEXIUM) 40 MG capsule TAKE 1 CAPSULE BY MOUTH EVERY MORNING (Patient taking differently: Take 40 mg by mouth in the morning. ) 90 capsule 3  . fexofenadine (ALLEGRA) 60 MG tablet Take 1 tablet by mouth 2 (two) times daily as needed for allergies.     . furosemide (LASIX) 40 MG tablet Take 40 mg by mouth every other day.     . gabapentin (NEURONTIN) 300 MG capsule Take 300 mg by mouth at bedtime.    Marland Kitchen GARLIC PO Take 1,062 mg by mouth daily.     . hydrALAZINE (APRESOLINE) 100 MG tablet Take 100 mg by mouth 4 (four) times daily.     Marland Kitchen ketorolac (ACULAR) 0.4 % SOLN Place 1 drop into the left eye 4 (four) times daily.    Marland Kitchen lisinopril (PRINIVIL,ZESTRIL) 40 MG tablet Take 40 mg by mouth daily.     . metFORMIN (GLUCOPHAGE) 500 MG tablet Take 500 mg by mouth daily with breakfast.     . metoprolol succinate (TOPROL-XL) 100 MG 24 hr tablet TAKE 1 TABLET(100 MG) BY MOUTH DAILY WITH FOOD (Patient taking differently: Take 100 mg by mouth daily. ) 90 tablet 3  . mirabegron ER (MYRBETRIQ) 50 MG TB24 tablet Take 1 tablet (50 mg total) by mouth daily. 30 tablet 11  . Multiple Vitamins-Minerals (CENTRUM) tablet Take 1 tablet by mouth daily.    Marland Kitchen ofloxacin (OCUFLOX) 0.3 % ophthalmic solution Place 1 drop into the left eye 4 (four) times daily.    . prazosin (MINIPRESS) 2 MG  capsule TAKE 1 CAPSULE(2 MG) BY MOUTH AT BEDTIME (Patient taking differently: Take 2 mg by mouth at bedtime. ) 90 capsule 2  . thyroid (NP THYROID) 60 MG tablet Take 1 tablet (60 mg total) by mouth  daily before breakfast. 30 tablet 3  . ammonium lactate (AMLACTIN) 12 % cream Apply topically as needed for dry skin.     Marland Kitchen ondansetron (ZOFRAN) 4 MG tablet Take 1 tablet (4 mg total) by mouth every 8 (eight) hours as needed for nausea or vomiting. 4 tablet 0  . PROAIR HFA 108 (90 BASE) MCG/ACT inhaler Inhale 1-2 puffs into the lungs every 6 (six) hours as needed for wheezing or shortness of breath.     . Probiotic Product (RESTORA PO) Take by mouth daily.     . [DISCONTINUED] OneTouch Delica Lancets 00X MISC 1 Units by Does not apply route 2 (two) times daily. (Patient not taking: Reported on 01/25/2020) 100 each 1   Social History   Socioeconomic History  . Marital status: Divorced    Spouse name: Not on file  . Number of children: 3  . Years of education: GED  . Highest education level: Not on file  Occupational History    Employer: NOT EMPLOYED  Tobacco Use  . Smoking status: Former Smoker    Packs/day: 1.50    Years: 25.00    Pack years: 37.50    Types: Cigarettes    Quit date: 04/25/2013    Years since quitting: 6.7  . Smokeless tobacco: Never Used  . Tobacco comment: 15-20 cigs a day as of 11/12/2012  Vaping Use  . Vaping Use: Never used  Substance and Sexual Activity  . Alcohol use: No  . Drug use: No  . Sexual activity: Yes    Birth control/protection: Surgical, Abstinence  Other Topics Concern  . Not on file  Social History Narrative   Divorced since 1994.Lives with boyfriend of 11 years.On disability.   Social Determinants of Health   Financial Resource Strain:   . Difficulty of Paying Living Expenses:   Food Insecurity:   . Worried About Charity fundraiser in the Last Year:   . Arboriculturist in the Last Year:   Transportation Needs:   . Film/video editor  (Medical):   Marland Kitchen Lack of Transportation (Non-Medical):   Physical Activity:   . Days of Exercise per Week:   . Minutes of Exercise per Session:   Stress:   . Feeling of Stress :   Social Connections:   . Frequency of Communication with Friends and Family:   . Frequency of Social Gatherings with Friends and Family:   . Attends Religious Services:   . Active Member of Clubs or Organizations:   . Attends Archivist Meetings:   Marland Kitchen Marital Status:   Intimate Partner Violence:   . Fear of Current or Ex-Partner:   . Emotionally Abused:   Marland Kitchen Physically Abused:   . Sexually Abused:    Family History  Problem Relation Age of Onset  . Diabetes Mother   . Hypertension Mother   . Anxiety disorder Mother   . Depression Mother   . Drug abuse Mother   . Heart disease Father   . Diabetes Father   . Anxiety disorder Father   . Alcohol abuse Father   . Depression Father   . OCD Father   . Thyroid disease Sister   . Healthy Brother   . Healthy Daughter   . Healthy Son   . ADD / ADHD Son   . Alcohol abuse Paternal Grandfather   . Depression Paternal Grandfather   . Cancer Paternal Grandfather        lung,skin  . Tuberculosis Paternal Grandfather   .  Seizures Paternal Grandmother   . Lupus Paternal Grandmother   . ADD / ADHD Son   . Anesthesia problems Neg Hx   . Malignant hyperthermia Neg Hx   . Pseudochol deficiency Neg Hx   . Hypotension Neg Hx   . Bipolar disorder Neg Hx   . Dementia Neg Hx   . Paranoid behavior Neg Hx   . Schizophrenia Neg Hx   . Sexual abuse Neg Hx   . Physical abuse Neg Hx     OBJECTIVE:  Vitals:   02/01/20 1537  BP: (!) 143/90  Pulse: (!) 112  Resp: 14  Temp: 98.8 F (37.1 C)  TempSrc: Oral  SpO2: 96%    General appearance: ALERT; in no acute distress.  Head: NCAT Lungs: Normal respiratory effort CV: Radial pulse 2+ . Cap refill < 2 seconds Musculoskeletal: Right hand Inspection: Skin warm, dry, clear and intact without obvious  erythema, effusion, or ecchymosis.  Palpation: TTP over 2nd and 3rd metacarpals,  dorsal aspect, RT hand ROM: FROM active and passive Strength: 4+/5 grip strength Negative phalen's test and tinel's sign Skin: warm and dry Neurologic: Ambulates without difficulty; Sensation intact about the upper extremities Psychological: alert and cooperative; normal mood and affect  ASSESSMENT & PLAN:  1. Right hand pain    Meds ordered this encounter  Medications  . predniSONE (DELTASONE) 20 MG tablet    Sig: Take 1 tablet (20 mg total) by mouth 2 (two) times daily with a meal for 5 days.    Dispense:  10 tablet    Refill:  0    Order Specific Question:   Supervising Provider    Answer:   Raylene Everts [[id number]]   Splint placed Continue conservative management of rest, ice, and gentle stretches Prednisone prescribed.  Take as directed and to completsion Follow up with orthopedist if symptoms persists Return or go to the ER if you have any new or worsening symptoms (fever, chills, chest pain, redness, swelling, bruising, deformity, etc...)   Reviewed expectations re: course of current medical issues. Questions answered. Outlined signs and symptoms indicating need for more acute intervention. Patient verbalized understanding. After Visit Summary given.    Lestine Box, PA-C 02/01/20 1555

## 2020-02-01 NOTE — Discharge Instructions (Signed)
Splint placed Continue conservative management of rest, ice, and gentle stretches Prednisone prescribed.  Take as directed and to completsion Follow up with orthopedist if symptoms persists Return or go to the ER if you have any new or worsening symptoms (fever, chills, chest pain, redness, swelling, bruising, deformity, etc...)

## 2020-02-02 ENCOUNTER — Telehealth (INDEPENDENT_AMBULATORY_CARE_PROVIDER_SITE_OTHER): Payer: Self-pay

## 2020-02-02 NOTE — Telephone Encounter (Signed)
Spoke with she is need an sooner appt with [name]  regarding further treatment patient.

## 2020-02-02 NOTE — Telephone Encounter (Signed)
Patient left voice mail message stating she has not received a call back from a message left on 7/18 - ph# [phone]

## 2020-02-02 NOTE — Telephone Encounter (Signed)
Mardelle Matte CMA has left a message for the patient to call the office back.

## 2020-02-02 NOTE — Telephone Encounter (Signed)
Called and lmv  Returning patient's call.

## 2020-02-03 NOTE — Telephone Encounter (Signed)
Per Dr.[name] the patient needs to have OV to discuss Domperidone. Patient will need to be called and made aware of the [place].

## 2020-02-04 ENCOUNTER — Ambulatory Visit: Payer: Medicare Other | Admitting: Urology

## 2020-02-05 ENCOUNTER — Telehealth (INDEPENDENT_AMBULATORY_CARE_PROVIDER_SITE_OTHER): Payer: Self-pay

## 2020-02-05 ENCOUNTER — Telehealth: Payer: Self-pay

## 2020-02-05 ENCOUNTER — Encounter (INDEPENDENT_AMBULATORY_CARE_PROVIDER_SITE_OTHER): Payer: Self-pay

## 2020-02-05 NOTE — Telephone Encounter (Signed)
Pt needs a sooner appt for stomach pain , she has an appt with Dr.[name] in Oct, have her come with Thayer Headings or Dr.C which ever as the sooner appt. Thanks.

## 2020-02-09 ENCOUNTER — Telehealth (INDEPENDENT_AMBULATORY_CARE_PROVIDER_SITE_OTHER): Payer: Self-pay | Admitting: Internal Medicine

## 2020-02-09 NOTE — Telephone Encounter (Signed)
Opened in error

## 2020-02-09 NOTE — Telephone Encounter (Signed)
several attempts have been made to reach patient with a sooner appointment - have not spoke with patient yet

## 2020-02-11 ENCOUNTER — Telehealth (INDEPENDENT_AMBULATORY_CARE_PROVIDER_SITE_OTHER): Payer: Self-pay | Admitting: Nurse Practitioner

## 2020-02-11 NOTE — Telephone Encounter (Signed)
Santiago Glad will you please call [facility] at [phone], and let them know that they sent [place] records from this patient's visit on 02/10/2020 however the entire record did not come through our system.  Would you request that they resend the office visit note?  Thank you.

## 2020-02-14 ENCOUNTER — Other Ambulatory Visit (INDEPENDENT_AMBULATORY_CARE_PROVIDER_SITE_OTHER): Payer: Self-pay | Admitting: Internal Medicine

## 2020-02-15 ENCOUNTER — Other Ambulatory Visit (INDEPENDENT_AMBULATORY_CARE_PROVIDER_SITE_OTHER): Payer: Self-pay | Admitting: Internal Medicine

## 2020-03-03 ENCOUNTER — Telehealth (HOSPITAL_COMMUNITY): Payer: Self-pay | Admitting: Psychiatry

## 2020-03-03 NOTE — Telephone Encounter (Signed)
Called patient to schedule follow up appt., left voice message

## 2020-03-04 ENCOUNTER — Other Ambulatory Visit (INDEPENDENT_AMBULATORY_CARE_PROVIDER_SITE_OTHER): Payer: Self-pay | Admitting: Internal Medicine

## 2020-03-06 IMAGING — DX DG HIP (WITH OR WITHOUT PELVIS) 2-3V*L*
3 series · 3 of 3 positions shown · non-contrast
Comparison: None.

CLINICAL DATA: Left hip pain after fall last week.

EXAM:
DG HIP (WITH OR WITHOUT PELVIS) 2-3V LEFT

[pelvis ap]
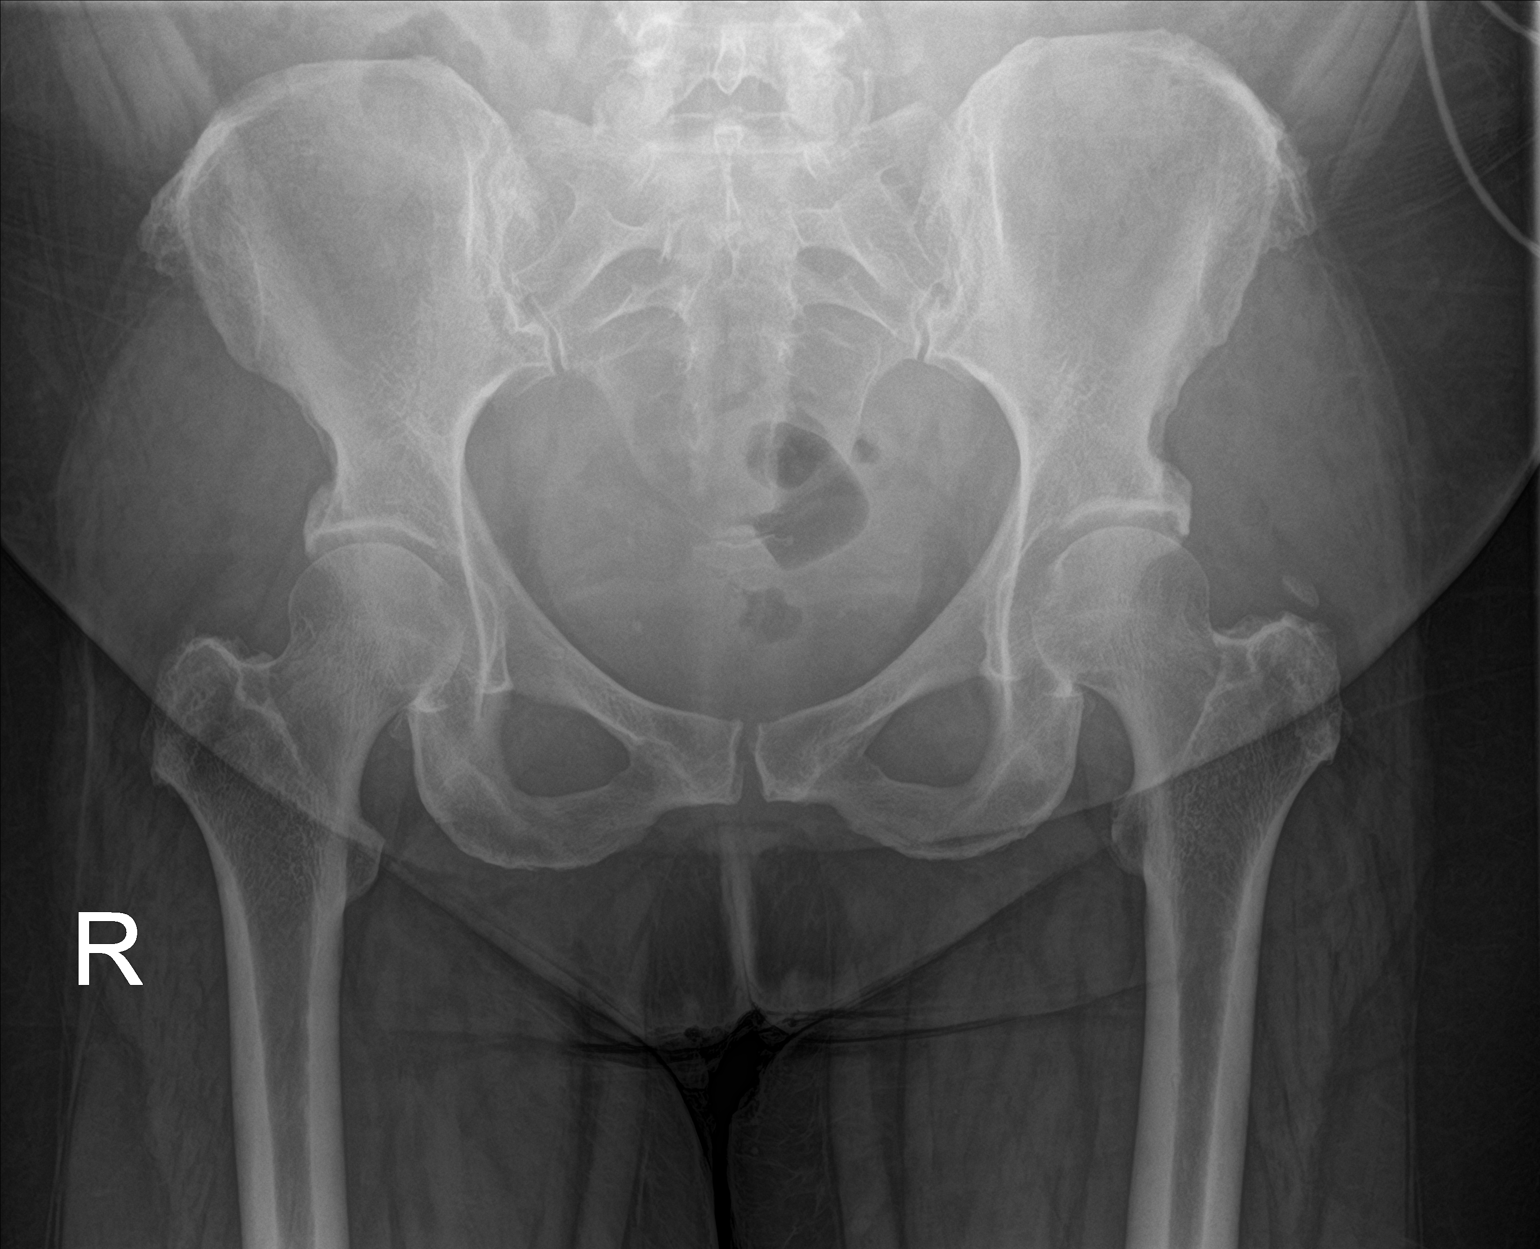

[hip ap]
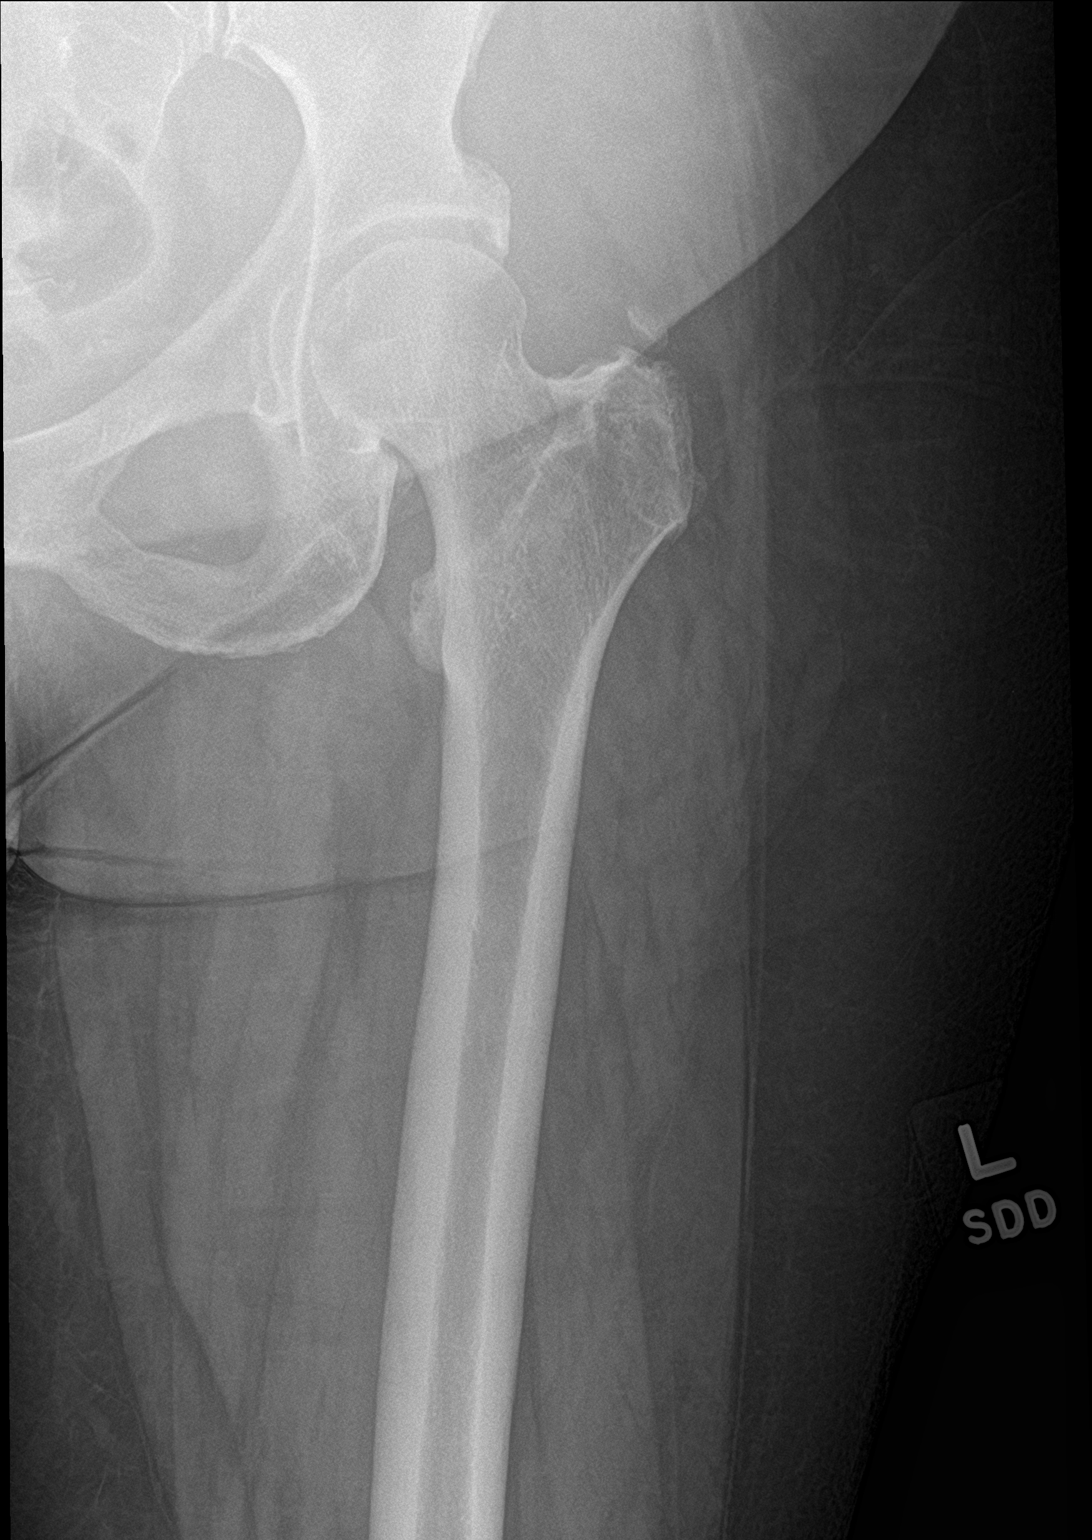

[hip lat]
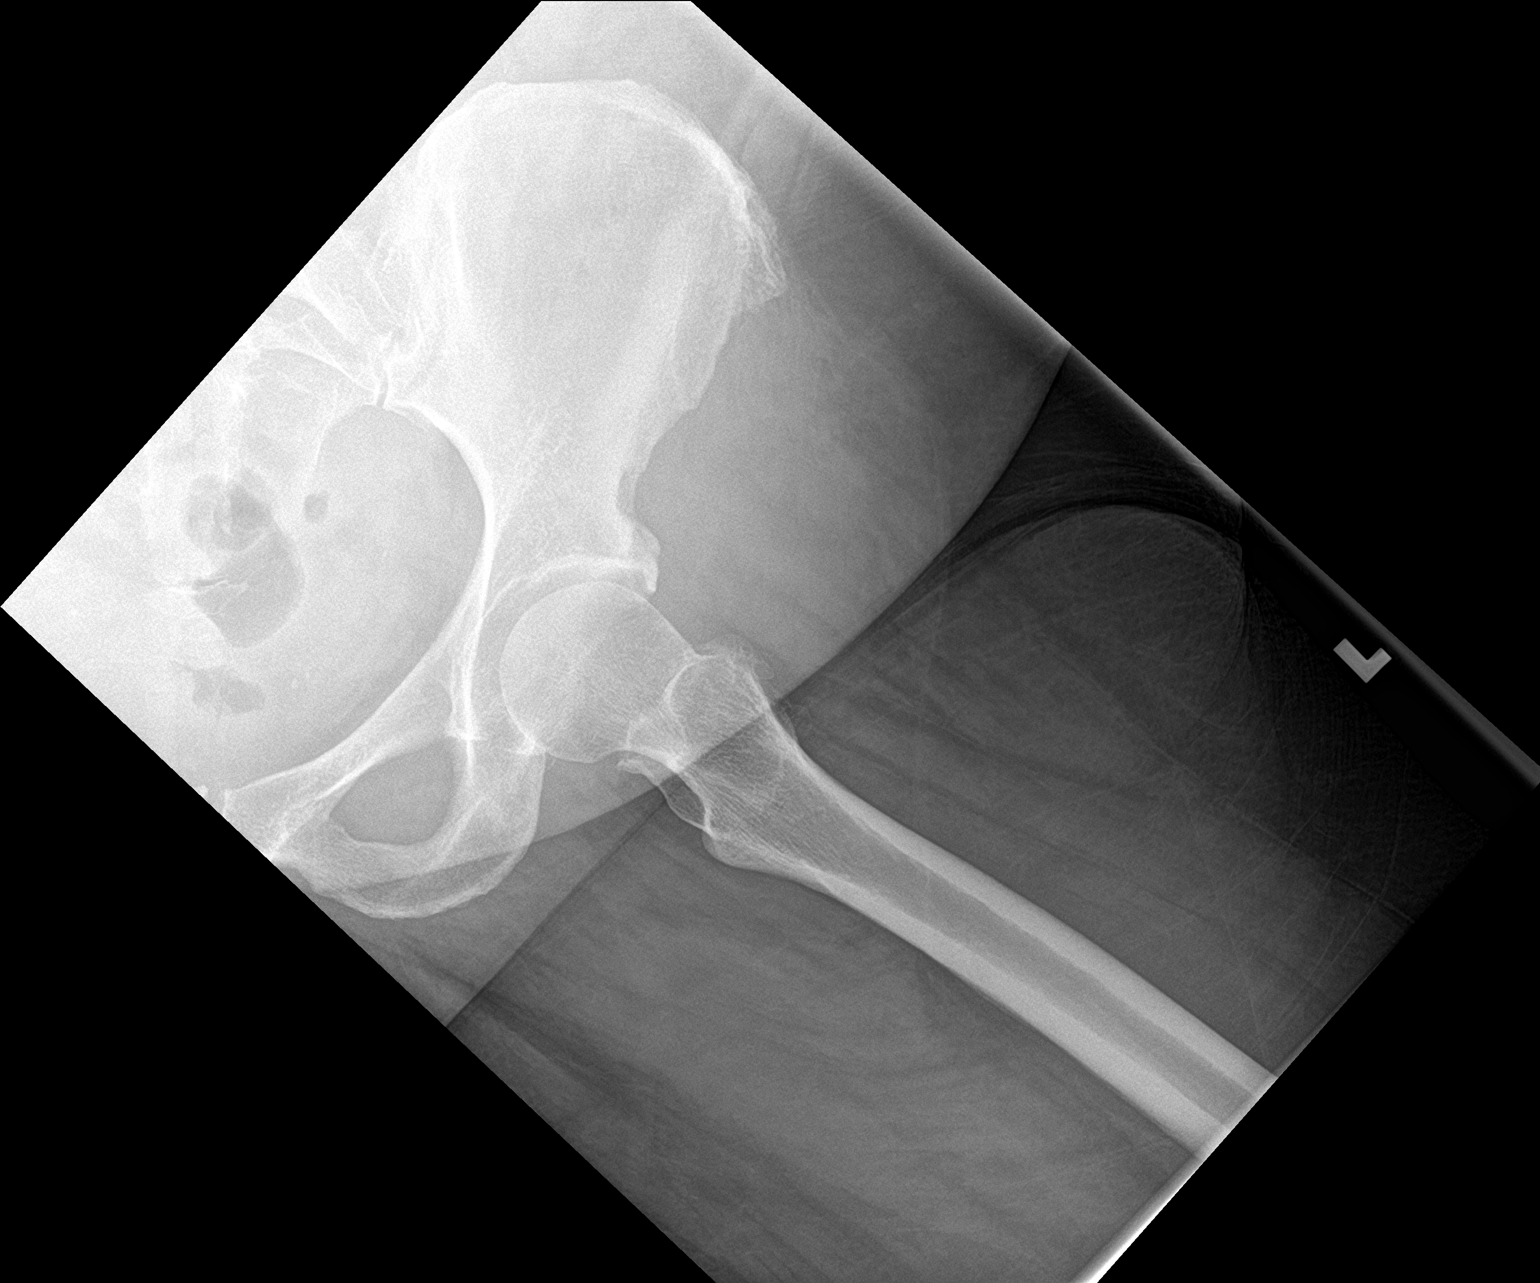

[3 of 3 positions shown; findings below may reference images not displayed]

FINDINGS: There is no evidence of hip fracture or dislocation. There is no
evidence of arthropathy or other focal bone abnormality.
IMPRESSION: Negative.

## 2020-03-17 ENCOUNTER — Other Ambulatory Visit (INDEPENDENT_AMBULATORY_CARE_PROVIDER_SITE_OTHER): Payer: Self-pay

## 2020-03-17 MED ORDER — LISINOPRIL 40 MG PO TABS: 40.0000 mg | ORAL_TABLET | Freq: Every day | ORAL | 0 refills | Status: DC

## 2020-03-24 ENCOUNTER — Other Ambulatory Visit: Payer: Self-pay

## 2020-03-24 ENCOUNTER — Ambulatory Visit
Admission: EM | Admit: 2020-03-24 | Discharge: 2020-03-24 | Disposition: A | Discharge status: Home / Self Care | Payer: Medicare Other | Attending: Emergency Medicine | Admitting: Emergency Medicine

## 2020-03-24 ENCOUNTER — Telehealth (INDEPENDENT_AMBULATORY_CARE_PROVIDER_SITE_OTHER): Payer: Self-pay | Admitting: Nurse Practitioner

## 2020-03-24 DIAGNOSIS — R42 Dizziness and giddiness: Secondary | ICD-10-CM | POA: Diagnosis not present

## 2020-03-24 MED ORDER — MECLIZINE HCL 12.5 MG PO TABS: 12.5000 mg | ORAL_TABLET | Freq: Three times a day (TID) | ORAL | 0 refills | Status: DC | PRN

## 2020-03-24 NOTE — ED Triage Notes (Signed)
Pt presents with c/o dizziness that began yesterday, worse with lying down

## 2020-03-24 NOTE — Discharge Instructions (Signed)
Advised to record blood pressure at home Take Antivert as needed for dizziness Follow-up with PCP Return or go to ED for worsening of symptoms

## 2020-03-24 NOTE — ED Provider Notes (Addendum)
[place]   [id number] 03/24/20 Arrival Time: 1911  [id number]  SUBJECTIVE:  Lindsay Walsh is a 49 y.o. female who presents with complaint of dizziness that began yesterday.  Denies a precipitating event, trauma, or recent URI within the past month.  Describes the dizziness as "the room spinning".  States that it is intermittent with episodes lasting few seconds.  Has not tried any medication.  Symptoms made worse with lying down.  Denies  previous symptoms in the past.  Denies fever, chills, nausea, vomiting, hearing changes, tinnitus, ear pain, chest pain, syncope, SOB, weakness, slurred speech, memory or emotional changes, facial drooping/ asymmetry, incoordination, numbness or tingling, abdominal pain, changes in bowel or bladder habits.      ROS: As per HPI.  All other pertinent ROS negative.    Past Medical History:  Diagnosis Date  . Acid reflux   . Allergic rhinitis due to pollen   . Angina    took SL nitro one week ago  . Anxiety   . Arthritis    back  . Arthritis   . Autoimmune hepatitis ([place])   . Back pain   . Chest pain    Normal stress echo in 2011; PVCs; pedal edema  . CVA (cerebral infarction)   . Depression   . Depression   . Diabetes mellitus without complication ([place])   . Dizziness and giddiness   . Dyspareunia 05/06/2014  . Dysrhythmia    palpatations  . Essential (primary) hypertension   . Fasting hyperglycemia   . Fibromyalgia   . Fibromyalgia   . Gastroesophageal reflux disease    Chronic abdominal pain; gastroparesis; globus hystericus; irritable bowel syndrome  . Glaucoma   . Hereditary and idiopathic neuropathy, unspecified   . Hyperlipidemia   . Hypertension   . Impaired glucose tolerance   . Irritable bowel syndrome   . Major depressive disorder, single episode, unspecified   . Morbid (severe) obesity due to excess calories ([place])   . Narcotic dependence ([place])   . Nausea   . Nonspecific mesenteric lymphadenitis   .  Overweight(278.02)   . Pain in limb   . Pain in right foot   . PTSD (post-traumatic stress disorder)   . Pulmonary nodule   . Restless legs syndrome   . Shortness of breath    with exertion  . Sleep apnea   . Sleep apnea    on CPAP machine  . Sleep apnea, unspecified   . Stroke ([place]) 2016  . Tobacco abuse    one pack per day; 35 pack years  . Type 2 diabetes mellitus with hyperglycemia [facility])    Past Surgical History:  Procedure Laterality Date  . ABDOMINAL HYSTERECTOMY     TAH&BSO  . BACK SURGERY    . BLADDER SUSPENSION  09/12/2011   Procedure: TRANSVAGINAL TAPE (TVT) PROCEDURE;  Surgeon: [name], MD;  Location: [facility];  Service: Urology;  Laterality: N/A;  . CESAREAN SECTION     X3  . CHOLECYSTECTOMY    . ESOPHAGOGASTRODUODENOSCOPY ENDOSCOPY     "throat stretched" per pt.  Marland Kitchen LIVER BIOPSY     x4  . LUMBAR EPIDURAL INJECTION  01/2012  . TOTAL ABDOMINAL HYSTERECTOMY W/ BILATERAL SALPINGOOPHORECTOMY    . TUBAL LIGATION     Bilateral  . UPPER GASTROINTESTINAL ENDOSCOPY  09/13/2010   Allergies  Allergen Reactions  . Doxycycline Shortness Of Breath and Rash  . Fentanyl Shortness Of Breath, Itching and Other (See Comments)  Heart racing, SOB  . Amlodipine     Unknown reaction  . Bupropion     Unknown reaction  . Cefoxitin     Unknown reaction  . Ceftin [Cefuroxime Axetil]     Unknown reaction  . Iodinated Diagnostic Agents Other (See Comments)    Knots on body  . Iohexol Other (See Comments)    Knots on body   . Norvasc [Amlodipine Besylate]   . Wellbutrin [Bupropion Hcl] Other (See Comments)    unknown  . Latex Rash  . Tape Rash   No current facility-administered medications on file prior to encounter.   Current Outpatient Medications on File Prior to Encounter  Medication Sig Dispense Refill  . ammonium lactate (AMLACTIN) 12 % cream Apply topically as needed for dry skin.     Marland Kitchen amphetamine-dextroamphetamine (ADDERALL) 20 MG tablet Take 1 tablet  (20 mg total) by mouth 2 (two) times daily. 60 tablet 0  . Ascorbic Acid (VITAMIN C) 1000 MG tablet Take 500 mg by mouth every morning.     Marland Kitchen aspirin 325 MG EC tablet Take 325 mg by mouth daily.    Marland Kitchen azaTHIOprine (IMURAN) 50 MG tablet TAKE 1 TABLET(50 MG) BY MOUTH DAILY (Patient taking differently: Take 50 mg by mouth daily. ) 30 tablet 5  . bethanechol (URECHOLINE) 10 MG tablet TAKE 1 TABLET(10 MG) BY MOUTH THREE TIMES DAILY BEFORE MEALS 90 tablet 2  . bimatoprost (LUMIGAN) 0.01 % SOLN Place 1 drop into both eyes at bedtime.    . calcium-vitamin D (OS-CAL 500 + D) 500-200 MG-UNIT per tablet Take 1 tablet by mouth 2 (two) times daily.     . cycloSPORINE (RESTASIS) 0.05 % ophthalmic emulsion Place 1 drop into both eyes daily.    . DULoxetine (CYMBALTA) 60 MG capsule TAKE 1 CAPSULE(60 MG) BY MOUTH TWICE DAILY (Patient taking differently: Take 60 mg by mouth 2 (two) times daily. ) 180 capsule 2  . esomeprazole (NEXIUM) 40 MG capsule TAKE 1 CAPSULE BY MOUTH EVERY MORNING 90 capsule 3  . fexofenadine (ALLEGRA) 60 MG tablet Take 1 tablet by mouth 2 (two) times daily as needed for allergies.     . furosemide (LASIX) 40 MG tablet Take 40 mg by mouth every other day.     . gabapentin (NEURONTIN) 300 MG capsule Take 300 mg by mouth at bedtime.    Marland Kitchen GARLIC PO Take 7,619 mg by mouth daily.     . hydrALAZINE (APRESOLINE) 100 MG tablet Take 100 mg by mouth 4 (four) times daily.     Marland Kitchen ketorolac (ACULAR) 0.4 % SOLN Place 1 drop into the left eye 4 (four) times daily.    Marland Kitchen lisinopril (ZESTRIL) 40 MG tablet Take 1 tablet (40 mg total) by mouth daily. 90 tablet 0  . metFORMIN (GLUCOPHAGE) 500 MG tablet TAKE 1 TABLET BY MOUTH ONCE DAILY 120 tablet 0  . metoprolol succinate (TOPROL-XL) 100 MG 24 hr tablet TAKE 1 TABLET(100 MG) BY MOUTH DAILY WITH FOOD (Patient taking differently: Take 100 mg by mouth daily. ) 90 tablet 3  . mirabegron ER (MYRBETRIQ) 50 MG TB24 tablet Take 1 tablet (50 mg total) by mouth daily. 30  tablet 11  . Multiple Vitamins-Minerals (CENTRUM) tablet Take 1 tablet by mouth daily.    Marland Kitchen ofloxacin (OCUFLOX) 0.3 % ophthalmic solution Place 1 drop into the left eye 4 (four) times daily.    . ondansetron (ZOFRAN) 4 MG tablet Take 1 tablet (4 mg total) by mouth every 8 (  eight) hours as needed for nausea or vomiting. 4 tablet 0  . prazosin (MINIPRESS) 2 MG capsule TAKE 1 CAPSULE(2 MG) BY MOUTH AT BEDTIME (Patient taking differently: Take 2 mg by mouth at bedtime. ) 90 capsule 2  . PROAIR HFA 108 (90 BASE) MCG/ACT inhaler Inhale 1-2 puffs into the lungs every 6 (six) hours as needed for wheezing or shortness of breath.     . Probiotic Product (RESTORA PO) Take by mouth daily.     Marland Kitchen thyroid (NP THYROID) 60 MG tablet Take 1 tablet (60 mg total) by mouth daily before breakfast. 30 tablet 3  . [DISCONTINUED] OneTouch Delica Lancets 50D MISC 1 Units by Does not apply route 2 (two) times daily. (Patient not taking: Reported on 01/25/2020) 100 each 1   Social History   Socioeconomic History  . Marital status: Divorced    Spouse name: Not on file  . Number of children: 3  . Years of education: GED  . Highest education level: Not on file  Occupational History    Employer: NOT EMPLOYED  Tobacco Use  . Smoking status: Former Smoker    Packs/day: 1.50    Years: 25.00    Pack years: 37.50    Types: Cigarettes    Quit date: 04/25/2013    Years since quitting: 6.9  . Smokeless tobacco: Never Used  . Tobacco comment: 15-20 cigs a day as of 11/12/2012  Vaping Use  . Vaping Use: Never used  Substance and Sexual Activity  . Alcohol use: No  . Drug use: No  . Sexual activity: Yes    Birth control/protection: Surgical, Abstinence  Other Topics Concern  . Not on file  Social History Narrative   Divorced since 1994.Lives with boyfriend of 11 years.On disability.   Social Determinants of Health   Financial Resource Strain:   . Difficulty of Paying Living Expenses: Not on file  Food Insecurity:    . Worried About Charity fundraiser in the Last Year: Not on file  . Ran Out of Food in the Last Year: Not on file  Transportation Needs:   . Lack of Transportation (Medical): Not on file  . Lack of Transportation (Non-Medical): Not on file  Physical Activity:   . Days of Exercise per Week: Not on file  . Minutes of Exercise per Session: Not on file  Stress:   . Feeling of Stress : Not on file  Social Connections:   . Frequency of Communication with Friends and Family: Not on file  . Frequency of Social Gatherings with Friends and Family: Not on file  . Attends Religious Services: Not on file  . Active Member of Clubs or Organizations: Not on file  . Attends Archivist Meetings: Not on file  . Marital Status: Not on file  Intimate Partner Violence:   . Fear of Current or Ex-Partner: Not on file  . Emotionally Abused: Not on file  . Physically Abused: Not on file  . Sexually Abused: Not on file   Family History  Problem Relation Age of Onset  . Diabetes Mother   . Hypertension Mother   . Anxiety disorder Mother   . Depression Mother   . Drug abuse Mother   . Heart disease Father   . Diabetes Father   . Anxiety disorder Father   . Alcohol abuse Father   . Depression Father   . OCD Father   . Thyroid disease Sister   . Healthy Brother   .  Healthy Daughter   . Healthy Son   . ADD / ADHD Son   . Alcohol abuse Paternal Grandfather   . Depression Paternal Grandfather   . Cancer Paternal Grandfather        lung,skin  . Tuberculosis Paternal Grandfather   . Seizures Paternal Grandmother   . Lupus Paternal Grandmother   . ADD / ADHD Son   . Anesthesia problems Neg Hx   . Malignant hyperthermia Neg Hx   . Pseudochol deficiency Neg Hx   . Hypotension Neg Hx   . Bipolar disorder Neg Hx   . Dementia Neg Hx   . Paranoid behavior Neg Hx   . Schizophrenia Neg Hx   . Sexual abuse Neg Hx   . Physical abuse Neg Hx     OBJECTIVE:  Vitals:   03/24/20 1953  BP:  96/74  Pulse: 82  Resp: 20  Temp: 98.6 F (37 C)  SpO2: 96%    General appearance: alert; no distress Eyes: PERRLA; EOMI; conjunctiva normal HENT: normocephalic; atraumatic; TMs normal; nasal mucosa normal; oral mucosa normal Neck: supple with FROM Lungs: clear to auscultation bilaterally Heart: regular rate and rhythm Abdomen: soft, non-tender; bowel sounds normal Extremities: no cyanosis or edema; symmetrical with no gross deformities Skin: warm and dry Neurologic: normal gait; normal symmetric reflexes; CN 2-12 grossly intact Psychological: alert and cooperative; normal mood and affect  Labs:  No results found for this or any previous visit (from the past 24 hour(s)). Orders placed or performed during the hospital encounter of 04/05/19  . ED EKG  . ED EKG  . EKG 12-Lead  . EKG 12-Lead  . EKG    No results found.  ASSESSMENT & PLAN:  1. Dizziness     Meds ordered this encounter  Medications  . meclizine (ANTIVERT) 12.5 MG tablet    Sig: Take 1 tablet (12.5 mg total) by mouth 3 (three) times daily as needed for dizziness.    Dispense:  30 tablet    Refill:  0   Discharge instructions  Advised to record blood pressure at home Take Antivert as needed for dizziness Follow-up with PCP Return or go to ED for worsening of symptoms  Reviewed expectations re: course of current medical issues. Questions answered. Outlined signs and symptoms indicating need for more acute intervention. Patient verbalized understanding. After Visit Summary given.    Emerson Monte, FNP 03/24/20 2028    Emerson Monte, FNP 03/24/20 2029

## 2020-03-25 ENCOUNTER — Other Ambulatory Visit: Payer: Self-pay

## 2020-03-25 ENCOUNTER — Telehealth (INDEPENDENT_AMBULATORY_CARE_PROVIDER_SITE_OTHER): Payer: Self-pay | Admitting: Psychiatry

## 2020-03-25 DIAGNOSIS — Z91199 Patient's noncompliance with other medical treatment and regimen due to unspecified reason: Secondary | ICD-10-CM

## 2020-03-25 DIAGNOSIS — Z5329 Procedure and treatment not carried out because of patient's decision for other reasons: Secondary | ICD-10-CM

## 2020-03-30 ENCOUNTER — Encounter (INDEPENDENT_AMBULATORY_CARE_PROVIDER_SITE_OTHER): Payer: Self-pay | Admitting: Internal Medicine

## 2020-03-30 ENCOUNTER — Other Ambulatory Visit: Payer: Self-pay

## 2020-03-30 ENCOUNTER — Ambulatory Visit (INDEPENDENT_AMBULATORY_CARE_PROVIDER_SITE_OTHER): Payer: Medicare Other | Admitting: Internal Medicine

## 2020-03-30 VITALS — BP 132/67 | HR 68 | Temp 96.9°F | Resp 18 | Ht 61.0 in | Wt 194.6 lb

## 2020-03-30 DIAGNOSIS — R42 Dizziness and giddiness: Secondary | ICD-10-CM | POA: Diagnosis not present

## 2020-03-30 NOTE — Progress Notes (Signed)
Metrics: Intervention Frequency ACO  Documented Smoking Status Yearly  Screened one or more times in 24 months  Cessation Counseling or  Active cessation medication Past 24 months  Past 24 months   Guideline developer: UpToDate (See UpToDate for funding source) Date Released: 2014       Wellness Office Visit  Subjective:  Patient ID: Lindsay Walsh, female    DOB: [birth year]  Age: 49 y.o. MRN: [id number]  CC: Dizziness. HPI  This patient comes in for an acute visit with symptoms that started a couple of days ago.  She was lying flat on her bed apparently when she experience vertigo.  She went to the urgent care and she was given meclizine.  Since she has glaucoma, she was concerned about taking the meclizine as she read that it might make it worse, which I agree with.  She wants to see if she can see a neurologist for an opinion. Past Medical History:  Diagnosis Date  . Acid reflux   . Allergic rhinitis due to pollen   . Angina    took SL nitro one week ago  . Anxiety   . Arthritis    back  . Arthritis   . Autoimmune hepatitis ([place])   . Back pain   . Chest pain    Normal stress echo in 2011; PVCs; pedal edema  . CVA (cerebral infarction)   . Depression   . Depression   . Diabetes mellitus without complication ([place])   . Dizziness and giddiness   . Dyspareunia 05/06/2014  . Dysrhythmia    palpatations  . Essential (primary) hypertension   . Fasting hyperglycemia   . Fibromyalgia   . Fibromyalgia   . Gastroesophageal reflux disease    Chronic abdominal pain; gastroparesis; globus hystericus; irritable bowel syndrome  . Glaucoma   . Hereditary and idiopathic neuropathy, unspecified   . Hyperlipidemia   . Hypertension   . Impaired glucose tolerance   . Irritable bowel syndrome   . Major depressive disorder, single episode, unspecified   . Morbid (severe) obesity due to excess calories ([place])   . Narcotic dependence ([place])   . Nausea   . Nonspecific mesenteric  lymphadenitis   . Overweight(278.02)   . Pain in limb   . Pain in right foot   . PTSD (post-traumatic stress disorder)   . Pulmonary nodule   . Restless legs syndrome   . Shortness of breath    with exertion  . Sleep apnea   . Sleep apnea    on CPAP machine  . Sleep apnea, unspecified   . Stroke ([place]) 2016  . Tobacco abuse    one pack per day; 35 pack years  . Type 2 diabetes mellitus with hyperglycemia [facility])    Past Surgical History:  Procedure Laterality Date  . ABDOMINAL HYSTERECTOMY     TAH&BSO  . BACK SURGERY    . BLADDER SUSPENSION  09/12/2011   Procedure: TRANSVAGINAL TAPE (TVT) PROCEDURE;  Surgeon: [name], MD;  Location: [facility];  Service: Urology;  Laterality: N/A;  . CESAREAN SECTION     X3  . CHOLECYSTECTOMY    . ESOPHAGOGASTRODUODENOSCOPY ENDOSCOPY     "throat stretched" per pt.  Marland Kitchen LIVER BIOPSY     x4  . LUMBAR EPIDURAL INJECTION  01/2012  . TOTAL ABDOMINAL HYSTERECTOMY W/ BILATERAL SALPINGOOPHORECTOMY    . TUBAL LIGATION     Bilateral  . UPPER GASTROINTESTINAL ENDOSCOPY  09/13/2010     Family History  Problem Relation Age of Onset  . Diabetes Mother   . Hypertension Mother   . Anxiety disorder Mother   . Depression Mother   . Drug abuse Mother   . Heart disease Father   . Diabetes Father   . Anxiety disorder Father   . Alcohol abuse Father   . Depression Father   . OCD Father   . Thyroid disease Sister   . Healthy Brother   . Healthy Daughter   . Healthy Son   . ADD / ADHD Son   . Alcohol abuse Paternal Grandfather   . Depression Paternal Grandfather   . Cancer Paternal Grandfather        lung,skin  . Tuberculosis Paternal Grandfather   . Seizures Paternal Grandmother   . Lupus Paternal Grandmother   . ADD / ADHD Son   . Anesthesia problems Neg Hx   . Malignant hyperthermia Neg Hx   . Pseudochol deficiency Neg Hx   . Hypotension Neg Hx   . Bipolar disorder Neg Hx   . Dementia Neg Hx   . Paranoid behavior Neg Hx   .  Schizophrenia Neg Hx   . Sexual abuse Neg Hx   . Physical abuse Neg Hx     Social History   Social History Narrative   Divorced since 1994.Lives with boyfriend of 11 years.On disability.   Social History   Tobacco Use  . Smoking status: Former Smoker    Packs/day: 1.50    Years: 25.00    Pack years: 37.50    Types: Cigarettes    Quit date: 04/25/2013    Years since quitting: 6.9  . Smokeless tobacco: Never Used  . Tobacco comment: 15-20 cigs a day as of 11/12/2012  Substance Use Topics  . Alcohol use: No    Current Meds  Medication Sig  . ammonium lactate (AMLACTIN) 12 % cream Apply topically as needed for dry skin.   Marland Kitchen amphetamine-dextroamphetamine (ADDERALL) 20 MG tablet Take 1 tablet (20 mg total) by mouth 2 (two) times daily.  . Ascorbic Acid (VITAMIN C) 1000 MG tablet Take 500 mg by mouth every morning.   Marland Kitchen aspirin 325 MG EC tablet Take 325 mg by mouth daily.  Marland Kitchen azaTHIOprine (IMURAN) 50 MG tablet TAKE 1 TABLET(50 MG) BY MOUTH DAILY (Patient taking differently: Take 50 mg by mouth daily. )  . bethanechol (URECHOLINE) 10 MG tablet TAKE 1 TABLET(10 MG) BY MOUTH THREE TIMES DAILY BEFORE MEALS  . bimatoprost (LUMIGAN) 0.01 % SOLN Place 1 drop into both eyes at bedtime.  . calcium-vitamin D (OS-CAL 500 + D) 500-200 MG-UNIT per tablet Take 1 tablet by mouth 2 (two) times daily.   . cycloSPORINE (RESTASIS) 0.05 % ophthalmic emulsion Place 1 drop into both eyes daily.  . DULoxetine (CYMBALTA) 60 MG capsule TAKE 1 CAPSULE(60 MG) BY MOUTH TWICE DAILY (Patient taking differently: Take 60 mg by mouth 2 (two) times daily. )  . esomeprazole (NEXIUM) 40 MG capsule TAKE 1 CAPSULE BY MOUTH EVERY MORNING  . fexofenadine (ALLEGRA) 60 MG tablet Take 1 tablet by mouth 2 (two) times daily as needed for allergies.   . furosemide (LASIX) 40 MG tablet Take 40 mg by mouth every other day.   . gabapentin (NEURONTIN) 300 MG capsule Take 300 mg by mouth at bedtime.  Marland Kitchen GARLIC PO Take 7,824 mg by mouth  daily.   . hydrALAZINE (APRESOLINE) 100 MG tablet Take 100 mg by mouth 4 (four) times daily.   Marland Kitchen ketorolac (ACULAR)  0.4 % SOLN Place 1 drop into the left eye 4 (four) times daily.  Marland Kitchen lisinopril (ZESTRIL) 40 MG tablet Take 1 tablet (40 mg total) by mouth daily.  . meclizine (ANTIVERT) 12.5 MG tablet Take 1 tablet (12.5 mg total) by mouth 3 (three) times daily as needed for dizziness.  . metFORMIN (GLUCOPHAGE) 500 MG tablet TAKE 1 TABLET BY MOUTH ONCE DAILY  . metoprolol succinate (TOPROL-XL) 100 MG 24 hr tablet TAKE 1 TABLET(100 MG) BY MOUTH DAILY WITH FOOD (Patient taking differently: Take 100 mg by mouth daily. )  . mirabegron ER (MYRBETRIQ) 50 MG TB24 tablet Take 1 tablet (50 mg total) by mouth daily.  . Multiple Vitamins-Minerals (CENTRUM) tablet Take 1 tablet by mouth daily.  Marland Kitchen ofloxacin (OCUFLOX) 0.3 % ophthalmic solution Place 1 drop into the left eye 4 (four) times daily.  . ondansetron (ZOFRAN) 4 MG tablet Take 1 tablet (4 mg total) by mouth every 8 (eight) hours as needed for nausea or vomiting.  . prazosin (MINIPRESS) 2 MG capsule TAKE 1 CAPSULE(2 MG) BY MOUTH AT BEDTIME (Patient taking differently: Take 2 mg by mouth at bedtime. )  . PRESCRIPTION MEDICATION Take 1 tablet by mouth daily. Take 1 tablet by mouth daily before food and at bedtime.  Marland Kitchen PROAIR HFA 108 (90 BASE) MCG/ACT inhaler Inhale 1-2 puffs into the lungs every 6 (six) hours as needed for wheezing or shortness of breath.   . Probiotic Product (RESTORA PO) Take by mouth daily.   Marland Kitchen thyroid (NP THYROID) 60 MG tablet Take 1 tablet (60 mg total) by mouth daily before breakfast.      Depression screen [facility] 2/9 10/20/2019 07/09/2019 07/19/2017  Decreased Interest 0 0 0  Down, Depressed, Hopeless 0 0 1  PHQ - 2 Score 0 0 1  Some recent data might be hidden     Objective:   Today's Vitals: BP 132/67   Pulse 68   Temp (!) 96.9 F (36.1 C) (Temporal)   Resp 18   Ht 5\' 1"  (1.549 m)   Wt 194 lb 9.6 oz (88.3 kg)   SpO2 98%    BMI 36.77 kg/m  Vitals with BMI 03/30/2020 03/24/2020 02/01/2020  Height 5\' 1"  - -  Weight 194 lbs 10 oz - -  BMI 46.56 - -  Systolic 812 96 751  Diastolic 67 74 90  Pulse 68 82 112  Some encounter information is confidential and restricted. Go to Review Flowsheets activity to see all data.     Physical Exam  She looks systemically well.  She is alert and orientated.  There are no obvious cranial nerve abnormalities.  She does not have any nystagmus.  Finger-to-nose test is normal.  Her gait appears to be normal.     Assessment   1. Dizziness       Tests ordered Orders Placed This Encounter  Procedures  . Ambulatory referral to Neurology     Plan: 1. I am not sure what is causing the dizziness.  It may well be labyrinthine in origin but she wanted to see a neurologist and I will refer her to one now. 2. Follow-up as previously scheduled.   No orders of the defined types were placed in this encounter.   Doree Albee, MD

## 2020-03-30 NOTE — Telephone Encounter (Signed)
Done

## 2020-04-01 ENCOUNTER — Ambulatory Visit: Payer: Medicare Other | Admitting: Urology

## 2020-04-01 DIAGNOSIS — H2511 Age-related nuclear cataract, right eye: Secondary | ICD-10-CM | POA: Diagnosis not present

## 2020-04-01 DIAGNOSIS — H401111 Primary open-angle glaucoma, right eye, mild stage: Secondary | ICD-10-CM | POA: Diagnosis not present

## 2020-04-20 ENCOUNTER — Ambulatory Visit (INDEPENDENT_AMBULATORY_CARE_PROVIDER_SITE_OTHER): Payer: Medicare Other | Admitting: Internal Medicine

## 2020-04-20 ENCOUNTER — Encounter (INDEPENDENT_AMBULATORY_CARE_PROVIDER_SITE_OTHER): Payer: Self-pay | Admitting: Internal Medicine

## 2020-04-20 ENCOUNTER — Other Ambulatory Visit: Payer: Self-pay

## 2020-04-20 VITALS — BP 135/85 | HR 116 | Temp 98.6°F | Ht 60.0 in | Wt 197.4 lb

## 2020-04-20 DIAGNOSIS — K580 Irritable bowel syndrome with diarrhea: Secondary | ICD-10-CM

## 2020-04-20 DIAGNOSIS — K3184 Gastroparesis: Secondary | ICD-10-CM | POA: Diagnosis not present

## 2020-04-20 DIAGNOSIS — E1143 Type 2 diabetes mellitus with diabetic autonomic (poly)neuropathy: Secondary | ICD-10-CM | POA: Diagnosis not present

## 2020-04-20 DIAGNOSIS — K219 Gastro-esophageal reflux disease without esophagitis: Secondary | ICD-10-CM

## 2020-04-20 MED ORDER — DICYCLOMINE HCL 10 MG PO CAPS: 10.0000 mg | ORAL_CAPSULE | Freq: Two times a day (BID) | ORAL | 1 refills | Status: DC | PRN

## 2020-04-20 NOTE — Patient Instructions (Signed)
Is: Experiencing side effects with Motilium/domperidone or if Nexium stops working.

## 2020-04-20 NOTE — Progress Notes (Signed)
Presenting complaint;  Follow for GERD gastroparesis and IBS. She also has a history of autoimmune hepatitis and being followed at [facility].  Database and subjective:  Patient is 49 year old Caucasian female with multiple medical problems who has a history of autoimmune hepatitis maintained on low-dose azathioprine also being followed at [facility] by Dr. Laurier Nancy, history of GERD and IBS who was seen in February with spells of nausea and vomiting.  Since she had had normal EGD at [facility] in October 2020 we proceeded with solid-phase gastric emptying study in February this year which revealed gastroparesis.   Patient is not a candidate for metoclopramide.  She was treated with bethanechol but without symptomatic improvement.  [place] she has been taking it 10 mg twice a day.  She is also watching her diet. Patient says she has not had spells of nausea and vomiting since she has been on domperidone.  She is not experiencing any side effects.  She feels heartburn is well controlled and Nexium is working.  She rarely has to take ondansetron.  She still has spells of intermittent postprandial diarrhea but not very frequently.  She needs a refill for dicyclomine. Patient says when she was seen at [facility] in June 2021 her heart rate was rapid.  She was therefore evaluated by cardiologist and work-up was negative.  She has been on metoprolol. She also complains of vertigo when she lies flat.  She is planning to follow-up with Dr. Merlene Laughter. She has noted some pain and swelling to right first carpal phalangeal joint and wonders if she has gout.  Patient advised to talk with Dr. Anastasio Champion about further evaluation. She says she received both doses of Covid vaccine without any side effects. While at [facility] she was also given pneumococcal vaccine. She says she had left cataract extraction in April 2021 and right 1 on 04/01/2020 with good results.   Current Medications: Outpatient  Encounter Medications as of 04/20/2020  Medication Sig  . amphetamine-dextroamphetamine (ADDERALL) 20 MG tablet Take 1 tablet (20 mg total) by mouth 2 (two) times daily.  . Ascorbic Acid (VITAMIN C) 1000 MG tablet Take 500 mg by mouth every morning.   Marland Kitchen aspirin 325 MG EC tablet Take 325 mg by mouth daily.  Marland Kitchen azaTHIOprine (IMURAN) 50 MG tablet TAKE 1 TABLET(50 MG) BY MOUTH DAILY (Patient taking differently: Take 50 mg by mouth daily. )  . bimatoprost (LUMIGAN) 0.01 % SOLN Place 1 drop into both eyes at bedtime.  . calcium-vitamin D (OS-CAL 500 + D) 500-200 MG-UNIT per tablet Take 1 tablet by mouth 2 (two) times daily.   . cycloSPORINE (RESTASIS) 0.05 % ophthalmic emulsion Place 1 drop into both eyes daily.  . DULoxetine (CYMBALTA) 60 MG capsule TAKE 1 CAPSULE(60 MG) BY MOUTH TWICE DAILY (Patient taking differently: Take 60 mg by mouth 2 (two) times daily. )  . esomeprazole (NEXIUM) 40 MG capsule TAKE 1 CAPSULE BY MOUTH EVERY MORNING  . fexofenadine (ALLEGRA) 60 MG tablet Take 1 tablet by mouth 2 (two) times daily as needed for allergies.   . furosemide (LASIX) 40 MG tablet Take 40 mg by mouth every other day.   . gabapentin (NEURONTIN) 300 MG capsule Take 300 mg by mouth at bedtime.  . hydrALAZINE (APRESOLINE) 100 MG tablet Take 100 mg by mouth 4 (four) times daily.   Marland Kitchen lisinopril (ZESTRIL) 40 MG tablet Take 1 tablet (40 mg total) by mouth daily.  . meclizine (ANTIVERT) 12.5 MG tablet  Take 1 tablet (12.5 mg total) by mouth 3 (three) times daily as needed for dizziness.  . metFORMIN (GLUCOPHAGE) 500 MG tablet TAKE 1 TABLET BY MOUTH ONCE DAILY  . metoprolol succinate (TOPROL-XL) 100 MG 24 hr tablet TAKE 1 TABLET(100 MG) BY MOUTH DAILY WITH FOOD (Patient taking differently: Take 100 mg by mouth daily. )  . mirabegron ER (MYRBETRIQ) 50 MG TB24 tablet Take 1 tablet (50 mg total) by mouth daily.  . Multiple Vitamins-Minerals (CENTRUM) tablet Take 1 tablet by mouth daily.  . ondansetron (ZOFRAN) 4 MG  tablet Take 1 tablet (4 mg total) by mouth every 8 (eight) hours as needed for nausea or vomiting.  . prazosin (MINIPRESS) 2 MG capsule TAKE 1 CAPSULE(2 MG) BY MOUTH AT BEDTIME (Patient taking differently: Take 2 mg by mouth at bedtime. )  . PRESCRIPTION MEDICATION Take 1 tablet by mouth daily. Take 1 tablet by mouth daily before food and at bedtime.   This is MOTILLIUM ( DOMPERIDONE 10 mg) Tablet  . Probiotic Product (RESTORA PO) Take by mouth daily.   . rosuvastatin (CRESTOR) 40 MG tablet rosuvastatin 40 mg tablet  . thyroid (NP THYROID) 60 MG tablet Take 1 tablet (60 mg total) by mouth daily before breakfast.  . bethanechol (URECHOLINE) 10 MG tablet TAKE 1 TABLET(10 MG) BY MOUTH THREE TIMES DAILY BEFORE MEALS (Patient not taking: Reported on 04/20/2020)  . [DISCONTINUED] ammonium lactate (AMLACTIN) 12 % cream Apply topically as needed for dry skin.  (Patient not taking: Reported on 04/20/2020)  . [DISCONTINUED] GARLIC PO Take 8,638 mg by mouth daily.  (Patient not taking: Reported on 04/20/2020)  . [DISCONTINUED] ketorolac (ACULAR) 0.4 % SOLN Place 1 drop into the left eye 4 (four) times daily. (Patient not taking: Reported on 04/20/2020)  . [DISCONTINUED] ofloxacin (OCUFLOX) 0.3 % ophthalmic solution Place 1 drop into the left eye 4 (four) times daily. (Patient not taking: Reported on 04/20/2020)  . [DISCONTINUED] OneTouch Delica Lancets 17R MISC 1 Units by Does not apply route 2 (two) times daily. (Patient not taking: Reported on 01/25/2020)  . [DISCONTINUED] PROAIR HFA 108 (90 BASE) MCG/ACT inhaler Inhale 1-2 puffs into the lungs every 6 (six) hours as needed for wheezing or shortness of breath.  (Patient not taking: Reported on 04/20/2020)   No facility-administered encounter medications on file as of 04/20/2020.     Objective: Blood pressure 135/85, pulse (!) 116, temperature 98.6 F (37 C), temperature source Oral, height 5' (1.524 m), weight 197 lb 6.4 oz (89.5 kg). Patient is alert  and in no acute distress. Patient is wearing a mask. Conjunctiva is pink. Sclera is nonicteric Oropharyngeal mucosa is normal. No neck masses or thyromegaly noted. Cardiac exam with regular rhythm normal S1 and S2. No murmur or gallop noted. Lungs are clear to auscultation. Abdomen is full but soft with mild midepigastric tenderness but no organomegaly or masses.  No LE edema or clubbing noted. She does not have tremors.  Labs/studies Results:  CBC Latest Ref Rng & Units 01/25/2020 10/20/2019 07/06/2019  WBC 4.0 - 10.5 K/uL 11.8(H) 9.0 12.7(H)  Hemoglobin 12.0 - 15.0 g/dL 14.5 12.7 12.8  Hematocrit 36 - 46 % 43.2 37.8 37.8  Platelets 150 - 400 K/uL 316 300 258    CMP Latest Ref Rng & Units 01/25/2020 01/21/2020 10/20/2019  Glucose 70 - 99 mg/dL 183(H) 172(H) 216(H)  BUN 6 - 20 mg/dL 13 8 7   Creatinine 0.44 - 1.00 mg/dL 0.68 0.71 0.67  Sodium 135 - 145 mmol/L 139  140 139  Potassium 3.5 - 5.1 mmol/L 4.0 4.6 4.6  Chloride 98 - 111 mmol/L 103 101 99  CO2 22 - 32 mmol/L 25 28 25   Calcium 8.9 - 10.3 mg/dL 8.9 9.4 9.1  Total Protein 6.5 - 8.1 g/dL 8.2(H) 6.4 6.9  Total Bilirubin 0.3 - 1.2 mg/dL 0.8 0.2 0.5  Alkaline Phos 38 - 126 U/L 107 108 120(H)  AST 15 - 41 U/L 24 20 17   ALT 0 - 44 U/L 25 22 15     Hepatic Function Latest Ref Rng & Units 01/25/2020 01/21/2020 10/20/2019  Total Protein 6.5 - 8.1 g/dL 8.2(H) 6.4 6.9  Albumin 3.5 - 5.0 g/dL 4.4 4.0 4.1  AST 15 - 41 U/L 24 20 17   ALT 0 - 44 U/L 25 22 15   Alk Phosphatase 38 - 126 U/L 107 108 120(H)  Total Bilirubin 0.3 - 1.2 mg/dL 0.8 0.2 0.5  Bilirubin, Direct <=0.2 mg/dL - - -      Assessment:  #1.  GERD and diabetic gastroparesis.  She is doing well with dietary measures, esomeprazole and low-dose domperidone.  Likely she is not experience any side effects with domperidone.  She has a history of sinus tachycardia which she has had before she went on domperidone.  Therefore domperidone is not the culprit.   #2.  Autoimmune  hepatitis.  She remains in biochemical remission.  She is on low-dose azathioprine.  She does not have cirrhosis.  #3.  History of irritable bowel syndrome with intermittent diarrhea.  Diarrhea is now occurring infrequently.  She can use dicyclomine on as-needed basis as she has taken this medication in the past without any side effects.  #4.  Patient is up-to-date on CRC screening.  Last colonoscopy was at [facility] in October 2020.   Plan:  New prescription given for dicyclomine 10 mg p.o. twice daily as needed 60 doses with 1 refill. Patient will continue esomeprazole and domperidone as before. Office visit in 6 months.

## 2020-04-29 ENCOUNTER — Ambulatory Visit (INDEPENDENT_AMBULATORY_CARE_PROVIDER_SITE_OTHER): Payer: Medicare Other | Admitting: Internal Medicine

## 2020-04-29 ENCOUNTER — Encounter (INDEPENDENT_AMBULATORY_CARE_PROVIDER_SITE_OTHER): Payer: Self-pay | Admitting: Internal Medicine

## 2020-04-29 ENCOUNTER — Other Ambulatory Visit: Payer: Self-pay

## 2020-04-29 VITALS — BP 146/78 | HR 101 | Temp 96.8°F | Resp 19 | Ht 60.0 in | Wt 197.4 lb

## 2020-04-29 DIAGNOSIS — E1159 Type 2 diabetes mellitus with other circulatory complications: Secondary | ICD-10-CM

## 2020-04-29 DIAGNOSIS — E039 Hypothyroidism, unspecified: Secondary | ICD-10-CM

## 2020-04-29 DIAGNOSIS — I10 Essential (primary) hypertension: Secondary | ICD-10-CM

## 2020-04-29 DIAGNOSIS — M79641 Pain in right hand: Secondary | ICD-10-CM

## 2020-04-29 NOTE — Progress Notes (Signed)
Metrics: Intervention Frequency ACO  Documented Smoking Status Yearly  Screened one or more times in 24 months  Cessation Counseling or  Active cessation medication Past 24 months  Past 24 months   Guideline developer: UpToDate (See UpToDate for funding source) Date Released: 2014       Wellness Office Visit  Subjective:  Patient ID: Lindsay Walsh, female    DOB: [birth year]  Age: 49 y.o. MRN: [id number]  CC: This lady comes in for follow-up of hypertension, diabetes, obesity, hypothyroidism. HPI  She is complaining of pain in the right hand especially around the base of the right thumb.  There is some swelling there also. Her gastrointestinal issues seem to be improving now.  Her last hemoglobin A1c was 7.7%. She has tolerated the dose of NP thyroid and she did seem to feel healthier and more energetic with this. She continues on lisinopril for hypertension as before. Past Medical History:  Diagnosis Date  . Acid reflux   . Allergic rhinitis due to pollen   . Angina    took SL nitro one week ago  . Anxiety   . Arthritis    back  . Arthritis   . Autoimmune hepatitis ([place])   . Back pain   . Chest pain    Normal stress echo in 2011; PVCs; pedal edema  . CVA (cerebral infarction)   . Depression   . Depression   . Diabetes mellitus without complication ([place])   . Dizziness and giddiness   . Dyspareunia 05/06/2014  . Dysrhythmia    palpatations  . Essential (primary) hypertension   . Fasting hyperglycemia   . Fibromyalgia   . Fibromyalgia   . Gastroesophageal reflux disease    Chronic abdominal pain; gastroparesis; globus hystericus; irritable bowel syndrome  . Glaucoma   . Hereditary and idiopathic neuropathy, unspecified   . Hyperlipidemia   . Hypertension   . Impaired glucose tolerance   . Irritable bowel syndrome   . Major depressive disorder, single episode, unspecified   . Morbid (severe) obesity due to excess calories ([place])   . Narcotic dependence ([place])     . Nausea   . Nonspecific mesenteric lymphadenitis   . Overweight(278.02)   . Pain in limb   . Pain in right foot   . PTSD (post-traumatic stress disorder)   . Pulmonary nodule   . Restless legs syndrome   . Shortness of breath    with exertion  . Sleep apnea   . Sleep apnea    on CPAP machine  . Sleep apnea, unspecified   . Stroke ([place]) 2016  . Tobacco abuse    one pack per day; 35 pack years  . Type 2 diabetes mellitus with hyperglycemia [facility])    Past Surgical History:  Procedure Laterality Date  . ABDOMINAL HYSTERECTOMY     TAH&BSO  . BACK SURGERY    . BLADDER SUSPENSION  09/12/2011   Procedure: TRANSVAGINAL TAPE (TVT) PROCEDURE;  Surgeon: [name], MD;  Location: [facility];  Service: Urology;  Laterality: N/A;  . CESAREAN SECTION     X3  . CHOLECYSTECTOMY    . ESOPHAGOGASTRODUODENOSCOPY ENDOSCOPY     "throat stretched" per pt.  Marland Kitchen LIVER BIOPSY     x4  . LUMBAR EPIDURAL INJECTION  01/2012  . TOTAL ABDOMINAL HYSTERECTOMY W/ BILATERAL SALPINGOOPHORECTOMY    . TUBAL LIGATION     Bilateral  . UPPER GASTROINTESTINAL ENDOSCOPY  09/13/2010     Family History  Problem Relation Age  of Onset  . Diabetes Mother   . Hypertension Mother   . Anxiety disorder Mother   . Depression Mother   . Drug abuse Mother   . Heart disease Father   . Diabetes Father   . Anxiety disorder Father   . Alcohol abuse Father   . Depression Father   . OCD Father   . Thyroid disease Sister   . Healthy Brother   . Healthy Daughter   . Healthy Son   . ADD / ADHD Son   . Alcohol abuse Paternal Grandfather   . Depression Paternal Grandfather   . Cancer Paternal Grandfather        lung,skin  . Tuberculosis Paternal Grandfather   . Seizures Paternal Grandmother   . Lupus Paternal Grandmother   . ADD / ADHD Son   . Anesthesia problems Neg Hx   . Malignant hyperthermia Neg Hx   . Pseudochol deficiency Neg Hx   . Hypotension Neg Hx   . Bipolar disorder Neg Hx   . Dementia Neg Hx   .  Paranoid behavior Neg Hx   . Schizophrenia Neg Hx   . Sexual abuse Neg Hx   . Physical abuse Neg Hx     Social History   Social History Narrative   Divorced since 1994.Lives with boyfriend of 11 years.On disability.   Social History   Tobacco Use  . Smoking status: Former Smoker    Packs/day: 1.50    Years: 25.00    Pack years: 37.50    Types: Cigarettes    Quit date: 04/25/2013    Years since quitting: 7.0  . Smokeless tobacco: Never Used  . Tobacco comment: 15-20 cigs a day as of 11/12/2012  Substance Use Topics  . Alcohol use: No    Current Meds  Medication Sig  . amphetamine-dextroamphetamine (ADDERALL) 20 MG tablet Take 1 tablet (20 mg total) by mouth 2 (two) times daily.  . Ascorbic Acid (VITAMIN C) 1000 MG tablet Take 500 mg by mouth every morning.   Marland Kitchen aspirin 325 MG EC tablet Take 325 mg by mouth daily.  Marland Kitchen azaTHIOprine (IMURAN) 50 MG tablet TAKE 1 TABLET(50 MG) BY MOUTH DAILY (Patient taking differently: Take 50 mg by mouth daily. )  . bimatoprost (LUMIGAN) 0.01 % SOLN Place 1 drop into both eyes at bedtime.  . calcium-vitamin D (OS-CAL 500 + D) 500-200 MG-UNIT per tablet Take 1 tablet by mouth 2 (two) times daily.   . cycloSPORINE (RESTASIS) 0.05 % ophthalmic emulsion Place 1 drop into both eyes daily.  Marland Kitchen dicyclomine (BENTYL) 10 MG capsule Take 1 capsule (10 mg total) by mouth 2 (two) times daily as needed.  . DULoxetine (CYMBALTA) 60 MG capsule TAKE 1 CAPSULE(60 MG) BY MOUTH TWICE DAILY (Patient taking differently: Take 60 mg by mouth 2 (two) times daily. )  . esomeprazole (NEXIUM) 40 MG capsule TAKE 1 CAPSULE BY MOUTH EVERY MORNING  . fexofenadine (ALLEGRA) 60 MG tablet Take 1 tablet by mouth 2 (two) times daily as needed for allergies.   . furosemide (LASIX) 40 MG tablet Take 40 mg by mouth every other day.   . gabapentin (NEURONTIN) 300 MG capsule Take 300 mg by mouth at bedtime.  . hydrALAZINE (APRESOLINE) 100 MG tablet Take 100 mg by mouth 4 (four) times daily.    Marland Kitchen lisinopril (ZESTRIL) 40 MG tablet Take 1 tablet (40 mg total) by mouth daily.  . meclizine (ANTIVERT) 12.5 MG tablet Take 1 tablet (12.5 mg total) by mouth  3 (three) times daily as needed for dizziness.  . metFORMIN (GLUCOPHAGE) 500 MG tablet TAKE 1 TABLET BY MOUTH ONCE DAILY  . metoprolol succinate (TOPROL-XL) 100 MG 24 hr tablet TAKE 1 TABLET(100 MG) BY MOUTH DAILY WITH FOOD (Patient taking differently: Take 100 mg by mouth daily. )  . mirabegron ER (MYRBETRIQ) 50 MG TB24 tablet Take 1 tablet (50 mg total) by mouth daily.  . Multiple Vitamins-Minerals (CENTRUM) tablet Take 1 tablet by mouth daily.  . ondansetron (ZOFRAN) 4 MG tablet Take 1 tablet (4 mg total) by mouth every 8 (eight) hours as needed for nausea or vomiting.  . prazosin (MINIPRESS) 2 MG capsule TAKE 1 CAPSULE(2 MG) BY MOUTH AT BEDTIME (Patient taking differently: Take 2 mg by mouth at bedtime. )  . PRESCRIPTION MEDICATION Take 1 tablet by mouth daily. Take 1 tablet by mouth daily before food and at bedtime.   This is MOTILLIUM ( DOMPERIDONE 10 mg) Tablet  . Probiotic Product (RESTORA PO) Take by mouth daily.   . rosuvastatin (CRESTOR) 40 MG tablet rosuvastatin 40 mg tablet  . thyroid (NP THYROID) 60 MG tablet Take 1 tablet (60 mg total) by mouth daily before breakfast.      Depression screen [facility] 2/9 10/20/2019 07/09/2019 07/19/2017  Decreased Interest 0 0 0  Down, Depressed, Hopeless 0 0 1  PHQ - 2 Score 0 0 1  Some recent data might be hidden     Objective:   Today's Vitals: BP (!) 146/78 (BP Location: Left Arm, Patient Position: Sitting, Cuff Size: Normal)   Pulse (!) 101   Temp (!) 96.8 F (36 C) (Temporal)   Resp 19   Ht 5' (1.524 m)   Wt 197 lb 6.4 oz (89.5 kg)   BMI 38.55 kg/m  Vitals with BMI 04/29/2020 04/20/2020 03/30/2020  Height 5\' 0"  5\' 0"  5\' 1"   Weight 197 lbs 6 oz 197 lbs 6 oz 194 lbs 10 oz  BMI 38.55 82.70 78.67  Systolic 544 920 100  Diastolic 78 85 67  Pulse 712 116 68  Some encounter  information is confidential and restricted. Go to Review Flowsheets activity to see all data.     Physical Exam  She looks systemically well.  She remains morbidly obese.  Blood pressure little elevated today but she has been in pain.  She is alert and orientated without any focal neurological signs. Examination of the right hand does show some swelling around the base of the right thumb.     Assessment   1. Essential hypertension   2. DM type 2 causing vascular disease ([place])   3. Acquired hypothyroidism   4. Morbid obesity ([place])   5. Pain of right hand       Tests ordered Orders Placed This Encounter  Procedures  . COMPLETE METABOLIC PANEL WITH GFR  . Hemoglobin A1c  . T3, free  . TSH  . Sedimentation rate  . C-reactive protein  . Uric Acid  . Rheumatoid Arthritis Profile  . COMPLETE METABOLIC PANEL WITH GFR  . Hemoglobin A1c  . T3, free  . TSH  . Sedimentation rate  . C-reactive protein  . Rheumatoid Arthritis Profile  . Uric Acid     Plan: 1. She will continue with the same antihypertensive therapy for the time being and continue to work on weight loss. 2. She will continue with oral hypoglycemic agents and we will check an A1c. 3. She will continue with the same dose of desiccated thyroid and we  will check thyroid function test. 4. As far as the pain in the right hand is concerned, there is some swelling there and I will check blood work for inflammatory markers, rheumatoid arthritis profile as well as uric acid levels. 5. Further recommendations will depend on all these results and I will see her in 3 months time for follow-up.  In the meantime, we also discussed nutrition with concept of intermittent fasting for 16 hours every day and a more of a plant-based diet.  I encouraged plenty of water every day at least 100 ounces.   No orders of the defined types were placed in this encounter.   Doree Albee, MD

## 2020-05-01 LAB — URIC ACID: Uric Acid: 3.7 mg/dL (ref 2.6–6.2)

## 2020-05-01 LAB — RHEUMATOID ARTHRITIS PROFILE
Cyclic Citrullin Peptide Ab: 6 units (ref 0–19)
Rheumatoid fact SerPl-aCnc: <10 IU/mL (ref 0.0–13.9)

## 2020-05-01 LAB — HEMOGLOBIN A1C
Est. average glucose Bld gHb Est-mCnc: 186 mg/dL
Hgb A1c MFr Bld: 8.1 % — ABNORMAL HIGH (ref 4.8–5.6)

## 2020-05-02 ENCOUNTER — Encounter (INDEPENDENT_AMBULATORY_CARE_PROVIDER_SITE_OTHER): Payer: Self-pay | Admitting: Internal Medicine

## 2020-05-03 ENCOUNTER — Other Ambulatory Visit (INDEPENDENT_AMBULATORY_CARE_PROVIDER_SITE_OTHER): Payer: Self-pay | Admitting: Internal Medicine

## 2020-05-03 DIAGNOSIS — M79641 Pain in right hand: Secondary | ICD-10-CM

## 2020-05-04 ENCOUNTER — Other Ambulatory Visit (HOSPITAL_COMMUNITY): Payer: Self-pay | Admitting: Internal Medicine

## 2020-05-04 DIAGNOSIS — Z1231 Encounter for screening mammogram for malignant neoplasm of breast: Secondary | ICD-10-CM

## 2020-05-05 DIAGNOSIS — M797 Fibromyalgia: Secondary | ICD-10-CM | POA: Diagnosis not present

## 2020-05-05 DIAGNOSIS — M5459 Other low back pain: Secondary | ICD-10-CM | POA: Diagnosis not present

## 2020-05-05 DIAGNOSIS — M1009 Idiopathic gout, multiple sites: Secondary | ICD-10-CM | POA: Diagnosis not present

## 2020-05-05 DIAGNOSIS — M545 Low back pain, unspecified: Secondary | ICD-10-CM | POA: Diagnosis not present

## 2020-05-05 DIAGNOSIS — G894 Chronic pain syndrome: Secondary | ICD-10-CM | POA: Diagnosis not present

## 2020-05-05 DIAGNOSIS — E1142 Type 2 diabetes mellitus with diabetic polyneuropathy: Secondary | ICD-10-CM | POA: Diagnosis not present

## 2020-05-05 DIAGNOSIS — R42 Dizziness and giddiness: Secondary | ICD-10-CM | POA: Diagnosis not present

## 2020-05-05 DIAGNOSIS — M542 Cervicalgia: Secondary | ICD-10-CM | POA: Diagnosis not present

## 2020-05-07 ENCOUNTER — Ambulatory Visit (HOSPITAL_COMMUNITY)
Admission: RE | Admit: 2020-05-07 | Discharge: 2020-05-07 | Disposition: A | Discharge status: Home / Self Care | Payer: Medicare Other | Source: Ambulatory Visit | Attending: Internal Medicine | Admitting: Internal Medicine

## 2020-05-07 ENCOUNTER — Other Ambulatory Visit: Payer: Self-pay

## 2020-05-07 DIAGNOSIS — Z1231 Encounter for screening mammogram for malignant neoplasm of breast: Secondary | ICD-10-CM | POA: Insufficient documentation

## 2020-05-10 ENCOUNTER — Other Ambulatory Visit: Payer: Self-pay

## 2020-05-10 ENCOUNTER — Encounter (HOSPITAL_COMMUNITY): Payer: Self-pay | Admitting: Psychiatry

## 2020-05-10 ENCOUNTER — Telehealth (INDEPENDENT_AMBULATORY_CARE_PROVIDER_SITE_OTHER): Payer: Medicare Other | Admitting: Psychiatry

## 2020-05-10 DIAGNOSIS — F900 Attention-deficit hyperactivity disorder, predominantly inattentive type: Secondary | ICD-10-CM

## 2020-05-10 DIAGNOSIS — F4312 Post-traumatic stress disorder, chronic: Secondary | ICD-10-CM | POA: Diagnosis not present

## 2020-05-10 DIAGNOSIS — F321 Major depressive disorder, single episode, moderate: Secondary | ICD-10-CM | POA: Diagnosis not present

## 2020-05-10 DIAGNOSIS — F515 Nightmare disorder: Secondary | ICD-10-CM

## 2020-05-10 MED ORDER — AMPHETAMINE-DEXTROAMPHETAMINE 20 MG PO TABS
20.0000 mg | ORAL_TABLET | Freq: Two times a day (BID) | ORAL | 0 refills | Status: DC
Start: 2020-05-10 — End: 2020-10-06

## 2020-05-10 MED ORDER — DULOXETINE HCL 60 MG PO CPEP: 60.0000 mg | ORAL_CAPSULE | Freq: Two times a day (BID) | ORAL | 2 refills | Status: DC

## 2020-05-10 MED ORDER — AMPHETAMINE-DEXTROAMPHETAMINE 20 MG PO TABS: 20.0000 mg | ORAL_TABLET | Freq: Two times a day (BID) | ORAL | 0 refills | Status: DC

## 2020-05-10 MED ORDER — PRAZOSIN HCL 2 MG PO CAPS: 2.0000 mg | ORAL_CAPSULE | Freq: Every day | ORAL | 2 refills | Status: DC

## 2020-05-10 NOTE — Progress Notes (Signed)
Patient called.  diabetes is getting worse so make sure you focus on nutrition. Rheumatoid factor for rheumatoid arthritis is negative.The test for gout, uric acid, is also in the normal range.I will refer you to rheumatology for further evaluation. Pt said she will work on what she is eatting.
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
## 2020-05-15 ENCOUNTER — Other Ambulatory Visit (INDEPENDENT_AMBULATORY_CARE_PROVIDER_SITE_OTHER): Payer: Self-pay | Admitting: Gastroenterology

## 2020-05-17 NOTE — Telephone Encounter (Signed)
Last seen 04/20/2020 for Dm gastroparaesis [name]

## 2020-05-25 ENCOUNTER — Telehealth (HOSPITAL_COMMUNITY): Payer: Self-pay | Admitting: Psychiatry

## 2020-05-25 NOTE — Telephone Encounter (Signed)
Called to schedule f/u appt, left vm 

## 2020-06-01 ENCOUNTER — Other Ambulatory Visit (INDEPENDENT_AMBULATORY_CARE_PROVIDER_SITE_OTHER): Payer: Self-pay | Admitting: Internal Medicine

## 2020-06-01 NOTE — Progress Notes (Addendum)
Office Visit Note  Patient: Lindsay Walsh             Date of Birth: 10/04/1970           MRN: [id number]             PCP: Doree Albee, MD Referring: Doree Albee, MD Visit Date: 06/02/2020  Subjective:  Pain of the Right Hand and New Patient (Initial Visit)   History of Present Illness: Lindsay Walsh is a 49 y.o. female with a history of hypertension, type 2 diabetes, GERD, fibromyalgia, hypothyroidism, overactive bladder, morbid obesity here for evaluation of right hand pain and swelling. She was diagnosed with autoimmune hepatitis in 2004 at [facility] with biopsy although negative AIH related serology.  Since that time she developed multiple medical problems and apparently had good response and disease activity was stable liver function after few years but remains on maintenance azathioprine.  She has had some chronic pain all over attributed to fibromyalgia but only in the past few months having increased right hand pain with swelling around the second and third MCP joints.  This has several hours of morning stiffness and also becomes very stiff and painful after a long day with more use.  She has not noticed a large improvement to NSAIDs.  She is also noticed some increase in what she describes as bone pain in her feet.  Previous work-up with her PCP office demonstrated negative rheumatoid arthritis antibodies and normal uric acid.  Labs reviewed 04/2020 RF negative CCP negative Uric acid 3.7  01/2020 CBC WBC 11.8  12/2007 ANA 1:160  ANCA negative Smooth muscle Abs negative  Activities of Daily Living:  Patient reports morning stiffness for 2 hours.   Patient Reports nocturnal pain.  Difficulty dressing/grooming: Reports Difficulty climbing stairs: Denies Difficulty getting out of chair: Denies Difficulty using hands for taps, buttons, cutlery, and/or writing: Reports  Review of Systems  Constitutional: Positive for fatigue.  HENT: Positive for mouth dryness.  Negative for mouth sores and nose dryness.   Eyes: Negative for pain, itching, visual disturbance and dryness.  Respiratory: Negative for cough, hemoptysis, shortness of breath and difficulty breathing.   Cardiovascular: Negative for chest pain, palpitations and swelling in legs/feet.  Gastrointestinal: Positive for constipation and diarrhea. Negative for abdominal pain and blood in stool.  Endocrine: Negative for increased urination.  Genitourinary: Negative for painful urination.  Musculoskeletal: Positive for arthralgias, joint pain, joint swelling, myalgias, muscle weakness, morning stiffness, muscle tenderness and myalgias.  Skin: Negative for color change, rash and redness.  Allergic/Immunologic: Negative for susceptible to infections.  Neurological: Positive for dizziness. Negative for numbness, headaches, memory loss and weakness.  Hematological: Negative for swollen glands.  Psychiatric/Behavioral: Positive for sleep disturbance. Negative for confusion.    PMFS History:  Patient Active Problem List   Diagnosis Date Noted  . Bilateral hand pain 06/02/2020  . OAB (overactive bladder) 11/05/2019  . Recurrent UTI 11/05/2019  . Diabetic gastroparesis ([place]) 08/21/2019  . Nausea and vomiting 07/15/2019  . Diarrhea 07/15/2019  . Abnormal finding on urinalysis 07/15/2019  . Morbid (severe) obesity due to excess calories ([place])   . Essential hypertension   . Major depressive disorder, single episode, unspecified   . Restless legs syndrome   . Sleep apnea   . Fibromyalgia   . Hereditary and idiopathic neuropathy, unspecified   . Nonspecific mesenteric lymphadenitis   . Pain in right foot   . Type 2 diabetes mellitus with hyperglycemia ([place])   .  Hypothyroidism 08/22/2018  . DM type 2 causing vascular disease ([place]) 08/21/2018  . Mixed hyperlipidemia 08/21/2018  . Essential hypertension, benign 08/21/2018  . Influenza A 08/10/2018  . SIRS (systemic inflammatory response syndrome)  ([place]) 08/10/2018  . Essential hypertension, malignant 03/07/2015  . Cerebral infarction ([place]) 03/05/2015  . Dyspareunia 05/06/2014  . Obesity 09/30/2013  . Bilateral hip pain 07/16/2013  . Chronic pain syndrome 07/16/2013  . Degenerative disc disease, lumbar 07/16/2013  . Greater trochanteric bursitis of both hips 07/16/2013  . Nightmares associated with chronic post-traumatic stress disorder 10/18/2012  . Sprain of foot 03/14/2012  . Weakness 01/29/2012  . Hepatitis, autoimmune ([place]) 01/29/2012  . IBS (irritable bowel syndrome) 08/01/2011  . Gastroesophageal reflux disease   . Hypertension   . Depression   . Chest pain   . Fasting hyperglycemia   . TOBACCO ABUSE 04/28/2010  . NARCOTIC ABUSE 04/28/2010  . PULMONARY NODULE 04/28/2010  . Autoimmune hepatitis ([place]) 04/28/2010  . Myalgia and myositis 01/06/2009    Past Medical History:  Diagnosis Date  . Acid reflux   . Allergic rhinitis due to pollen   . Angina    took SL nitro one week ago  . Anxiety   . Arthritis    back  . Arthritis   . Autoimmune hepatitis ([place])   . Back pain   . Chest pain    Normal stress echo in 2011; PVCs; pedal edema  . CVA (cerebral infarction)   . Depression   . Depression   . Diabetes mellitus without complication ([place])   . Dizziness and giddiness   . Dyspareunia 05/06/2014  . Dysrhythmia    palpatations  . Essential (primary) hypertension   . Fasting hyperglycemia   . Fibromyalgia   . Fibromyalgia   . Gastroesophageal reflux disease    Chronic abdominal pain; gastroparesis; globus hystericus; irritable bowel syndrome  . Glaucoma   . Hereditary and idiopathic neuropathy, unspecified   . Hyperlipidemia   . Hypertension   . Impaired glucose tolerance   . Irritable bowel syndrome   . Major depressive disorder, single episode, unspecified   . Morbid (severe) obesity due to excess calories ([place])   . Narcotic dependence ([place])   . Nausea   . Nonspecific mesenteric lymphadenitis   .  Overweight(278.02)   . Pain in limb   . Pain in right foot   . PTSD (post-traumatic stress disorder)   . Pulmonary nodule   . Restless legs syndrome   . Shortness of breath    with exertion  . Sleep apnea   . Sleep apnea    on CPAP machine  . Sleep apnea, unspecified   . Stroke ([place]) 2016  . Tobacco abuse    one pack per day; 35 pack years  . Type 2 diabetes mellitus with hyperglycemia ([facility])     Family History  Problem Relation Age of Onset  . Diabetes Mother   . Hypertension Mother   . Anxiety disorder Mother   . Depression Mother   . Drug abuse Mother   . Heart disease Father   . Diabetes Father   . Anxiety disorder Father   . Alcohol abuse Father   . Depression Father   . OCD Father   . Thyroid disease Sister   . Healthy Brother   . Healthy Daughter   . Healthy Son   . ADD / ADHD Son   . Alcohol abuse Paternal Grandfather   . Depression Paternal Grandfather   . Cancer Paternal  Grandfather        lung,skin  . Tuberculosis Paternal Grandfather   . Seizures Paternal Grandmother   . Lupus Paternal Grandmother   . ADD / ADHD Son   . Anesthesia problems Neg Hx   . Malignant hyperthermia Neg Hx   . Pseudochol deficiency Neg Hx   . Hypotension Neg Hx   . Bipolar disorder Neg Hx   . Dementia Neg Hx   . Paranoid behavior Neg Hx   . Schizophrenia Neg Hx   . Sexual abuse Neg Hx   . Physical abuse Neg Hx    Past Surgical History:  Procedure Laterality Date  . ABDOMINAL HYSTERECTOMY     TAH&BSO  . BACK SURGERY    . BLADDER SUSPENSION  09/12/2011   Procedure: TRANSVAGINAL TAPE (TVT) PROCEDURE;  Surgeon: [name], MD;  Location: [facility];  Service: Urology;  Laterality: N/A;  . CESAREAN SECTION     X3  . CHOLECYSTECTOMY    . ESOPHAGOGASTRODUODENOSCOPY ENDOSCOPY     "throat stretched" per pt.  Marland Kitchen LIVER BIOPSY     x4  . LUMBAR EPIDURAL INJECTION  01/2012  . TOTAL ABDOMINAL HYSTERECTOMY W/ BILATERAL SALPINGOOPHORECTOMY    . TUBAL LIGATION     Bilateral  .  UPPER GASTROINTESTINAL ENDOSCOPY  09/13/2010   Social History   Social History Narrative   Divorced since 1994.Lives with boyfriend of 11 years.On disability.   Immunization History  Administered Date(s) Administered  . Influenza Inj Mdck Quad Pf 04/20/2016  . Influenza Split 04/07/2013, 05/17/2017  . Influenza,inj,Quad PF,6+ Mos 05/17/2017, 04/04/2018, 03/31/2019, 04/28/2019  . Influenza-Unspecified 04/17/2012, 03/20/2014, 08/09/2014, 05/06/2015  . Moderna SARS-COVID-2 Vaccination 02/06/2020, 03/05/2020  . Pneumococcal Conjugate-13 12/18/2019  . Pneumococcal Polysaccharide-23 04/20/2016     Objective: Vital Signs: BP 113/67 (BP Location: Left Arm, Patient Position: Sitting, Cuff Size: Small)   Pulse (!) 110   Ht 5' (1.524 m)   Wt 198 lb 9.6 oz (90.1 kg)   BMI 38.79 kg/m    Physical Exam Constitutional:      Appearance: She is obese.  HENT:     Right Ear: External ear normal.     Left Ear: External ear normal.     Mouth/Throat:     Mouth: Mucous membranes are moist.     Pharynx: Oropharynx is clear.  Eyes:     Conjunctiva/sclera: Conjunctivae normal.  Cardiovascular:     Rate and Rhythm: Regular rhythm. Tachycardia present.  Pulmonary:     Effort: Pulmonary effort is normal.  Skin:    General: Skin is warm and dry.  Neurological:     General: No focal deficit present.     Mental Status: She is alert.  Psychiatric:        Mood and Affect: Mood normal.     Musculoskeletal Exam:  Neck full range of motion no tenderness Shoulder, elbow full range of motion no tenderness or swelling Right hand tenderness over third MCP joint no swelling or warmth appreciated and has full range of motion, no left hand abnormalities Paraspinal tenderness to palpation over upper back bilaterally Normal hip internal and external rotation without pain, left lateral hip tenderness to palpation Knees, ankles full range of motion no tenderness or swelling   Investigation: No additional  findings.  Imaging: MM 3D SCREEN BREAST BILATERAL  Result Date: 05/11/2020 CLINICAL DATA:  Screening. EXAM: DIGITAL SCREENING BILATERAL MAMMOGRAM WITH TOMO AND CAD COMPARISON:  Previous exam(s). ACR Breast Density Category a: The breast tissue is almost  entirely fatty. FINDINGS: There are no findings suspicious for malignancy. Images were processed with CAD. IMPRESSION: No mammographic evidence of malignancy. A result letter of this screening mammogram will be mailed directly to the patient. RECOMMENDATION: Screening mammogram in one year. (Code:SM-B-01Y) BI-RADS CATEGORY  1: Negative. Electronically Signed   By: Evangeline Dakin M.D.   On: 05/11/2020 09:09   XR Hand 2 View Left  Result Date: 06/02/2020 X-ray left hand 2 views Radiocarpal carpal joint spaces appear normal.  Normal MCP, PIP, DIP joint spaces.  Bone mineralization appears normal.  No soft tissue swelling seen. Impression No significant degenerative inflammatory arthritis abnormality seen  XR Hand 2 View Right  Result Date: 06/02/2020 X-ray right hand 2 views Radiocarpal and carpal joint spaces appear normal.  Normal MCP, PIP, DIP joint spaces.  Possible small marginal osteophytes seen.  Bone mineralization appears normal.  No soft tissue swelling seen. Impression No significant degenerative or inflammatory arthritis abnormality seen   Recent Labs: Lab Results  Component Value Date   WBC 11.8 (H) 01/25/2020   HGB 14.5 01/25/2020   PLT 316 01/25/2020   NA 139 01/25/2020   K 4.0 01/25/2020   CL 103 01/25/2020   CO2 25 01/25/2020   GLUCOSE 183 (H) 01/25/2020   BUN 13 01/25/2020   CREATININE 0.68 01/25/2020   BILITOT 0.8 01/25/2020   ALKPHOS 107 01/25/2020   AST 24 01/25/2020   ALT 25 01/25/2020   PROT 8.2 (H) 01/25/2020   ALBUMIN 4.4 01/25/2020   CALCIUM 8.9 01/25/2020   GFRAA >60 01/25/2020    Speciality Comments: No specialty comments available.  Procedures:  No procedures performed Allergies: Doxycycline,  Fentanyl, Amlodipine, Bupropion, Cefoxitin, Ceftin [cefuroxime axetil], Iodinated diagnostic agents, Iohexol, Norvasc [amlodipine besylate], Wellbutrin [bupropion hcl], Latex, and Tape   Assessment / Plan:     Visit Diagnoses: Bilateral hand pain Autoimmune hepatitis- Plan: XR Hand 2 View Right, XR Hand 2 View Left, ANA, Anti-scleroderma antibody, RNP Antibody, Anti-Smith antibody, Sjogrens syndrome-A extractable nuclear antibody, Sjogrens syndrome-B extractable nuclear antibody, Anti-DNA antibody, double-stranded, Sedimentation rate  The bilateral hand pain much worse on the right no clear inflammatory changes seen on exam today though she describes intermittent swelling at the MCP.  Hand x-rays are unremarkable for degenerative or inflammatory changes.  Based on exam and history I am less suspicious for inflammatory joint disease but will further work-up with ANA pattern and ENA panel as well as sedimentation rate.  I expect ANA will be positive since it was years ago during autoimmune hepatitis work-up but unless more evidence of inflammation would not add additional immunosuppression on top of her current maintenance Imuran we will can repeat exam in a few weeks.  Fibromyalgia  She has diffuse body pains that have not changed significantly over the same recent timeframe as the right hand.  These are consistent with fibromyalgia which she has previously been evaluated for.  We talked about importance of patient self-care for this as more important than pharmacotherapy.  Provide information and online resources for reviewing some self-management options.  Orders: Orders Placed This Encounter  Procedures  . XR Hand 2 View Right  . XR Hand 2 View Left  . ANA  . Anti-scleroderma antibody  . RNP Antibody  . Anti-Smith antibody  . Sjogrens syndrome-A extractable nuclear antibody  . Sjogrens syndrome-B extractable nuclear antibody  . Anti-DNA antibody, double-stranded  . Sedimentation rate   No  orders of the defined types were placed in this encounter.   Follow-Up  Instructions: Return in about 2 weeks (around 06/16/2020).   Collier Salina, MD  Note - This record has been created using [employer].  Chart creation errors have been sought, but may not always  have been located. Such creation errors do not reflect on  the standard of medical care.

## 2020-06-02 ENCOUNTER — Encounter: Payer: Self-pay | Admitting: Internal Medicine

## 2020-06-02 ENCOUNTER — Other Ambulatory Visit: Payer: Self-pay

## 2020-06-02 ENCOUNTER — Ambulatory Visit (INDEPENDENT_AMBULATORY_CARE_PROVIDER_SITE_OTHER): Payer: Medicare Other | Admitting: Internal Medicine

## 2020-06-02 ENCOUNTER — Ambulatory Visit: Payer: Self-pay

## 2020-06-02 VITALS — BP 113/67 | HR 110 | Ht 60.0 in | Wt 198.6 lb

## 2020-06-02 DIAGNOSIS — M797 Fibromyalgia: Secondary | ICD-10-CM

## 2020-06-02 DIAGNOSIS — M79642 Pain in left hand: Secondary | ICD-10-CM | POA: Diagnosis not present

## 2020-06-02 DIAGNOSIS — K754 Autoimmune hepatitis: Secondary | ICD-10-CM | POA: Diagnosis not present

## 2020-06-02 DIAGNOSIS — M79641 Pain in right hand: Secondary | ICD-10-CM

## 2020-06-02 NOTE — Patient Instructions (Signed)
We are checking some lab tests for antibodies associated with conditions such as lupus and other inflammatory diseases. Your ANA was previously positive related to the autoimmune hepatitis but no other specific antibody tests were positive at that time.   I recommend checking out the [place] webiste about self-care for fibromyalgia symptoms including muscle and joint pain as well as fatigue: [email or web address]-oconnell.[email or web address]   Antinuclear Antibody Test Why am I having this test? This is a test that is used to help diagnose systemic lupus erythematosus (SLE) and other autoimmune diseases. An autoimmune disease is a disease in which the body's own defense (immune)system attacks its organs. What is being tested? This test checks for antinuclear antibodies (ANA) in the blood. The presence of ANA is associated with several autoimmune diseases. It is seen in almost all patients with lupus. What kind of sample is taken?  A blood sample is required for this test. It is usually collected by inserting a needle into a blood vessel. How are the results reported? Your test results will be reported as either positive or negative. A false-positive result can occur. A false positive is incorrect because it means that a condition is present when it is not. What do the results mean? A positive test result may mean that you have:  Lupus.  Other autoimmune diseases, such as rheumatoid arthritis, scleroderma, or Sjgren syndrome. Conditions that may cause a false-positive result include:  Liver dysfunction.  Myasthenia gravis.  Infectious mononucleosis. Talk with your health care provider about what your results mean. Questions to ask your health care provider Ask your health care provider, or the department that is doing the test:  When will my results be ready?  How will I get my results?  What are my treatment options?  What other tests do I need?  What are my next  steps? Summary  This is a test that is used to help diagnose systemic lupus erythematosus (SLE) and other autoimmune diseases. An autoimmune disease is a disease in which the body's own defense (immune)system attacks the body.  This test checks for antinuclear antibodies (ANA) in the blood. The presence of ANA is associated with several autoimmune diseases. It is seen in almost all patients with lupus.  Your test results will be reported as either positive or negative. Talk with your health care provider about what your results mean. This information is not intended to replace advice given to you by your health care provider. Make sure you discuss any questions you have with your health care provider. Document Revised: 06/08/2017 Document Reviewed: 02/22/2017 Elsevier Patient Education  [place].    Myofascial Pain Syndrome and Fibromyalgia Myofascial pain syndrome and fibromyalgia are both pain disorders. This pain may be felt mainly in your muscles.  Myofascial pain syndrome: ? Always has tender points in the muscle that will cause pain when pressed (trigger points). The pain may come and go. ? Usually affects your neck, upper back, and shoulder areas. The pain often radiates into your arms and hands.  Fibromyalgia: ? Has muscle pains and tenderness that come and go. ? Is often associated with fatigue and sleep problems. ? Has trigger points. ? Tends to be long-lasting (chronic), but is not life-threatening. Fibromyalgia and myofascial pain syndrome are not the same. However, they often occur together. If you have both conditions, each can make the other worse. Both are common and can cause enough pain and fatigue to make day-to-day activities difficult. Both can be hard to  diagnose because their symptoms are common in many other conditions. What are the causes? The exact causes of these conditions are not known. What increases the risk? You are more likely to develop this  condition if:  You have a family history of the condition.  You have certain triggers, such as: ? Spine disorders. ? An injury (trauma) or other physical stressors. ? Being under a lot of stress. ? Medical conditions such as osteoarthritis, rheumatoid arthritis, or lupus. What are the signs or symptoms? Fibromyalgia The main symptom of fibromyalgia is widespread pain and tenderness in your muscles. Pain is sometimes described as stabbing, shooting, or burning. You may also have:  Tingling or numbness.  Sleep problems and fatigue.  Problems with attention and concentration (fibro fog). Other symptoms may include:  Bowel and bladder problems.  Headaches.  Visual problems.  Problems with odors and noises.  Depression or mood changes.  Painful menstrual periods (dysmenorrhea).  Dry skin or eyes. These symptoms can vary over time. Myofascial pain syndrome Symptoms of myofascial pain syndrome include:  Tight, ropy bands of muscle.  Uncomfortable sensations in muscle areas. These may include aching, cramping, burning, numbness, tingling, and weakness.  Difficulty moving certain parts of the body freely (poor range of motion). How is this diagnosed? This condition may be diagnosed by your symptoms and medical history. You will also have a physical exam. In general:  Fibromyalgia is diagnosed if you have pain, fatigue, and other symptoms for more than 3 months, and symptoms cannot be explained by another condition.  Myofascial pain syndrome is diagnosed if you have trigger points in your muscles, and those trigger points are tender and cause pain elsewhere in your body (referred pain). How is this treated? Treatment for these conditions depends on the type that you have.  For fibromyalgia: ? Pain medicines, such as NSAIDs. ? Medicines for treating depression. ? Medicines for treating seizures. ? Medicines that relax the muscles.  For myofascial pain: ? Pain  medicines, such as NSAIDs. ? Cooling and stretching of muscles. ? Trigger point injections. ? Sound wave (ultrasound) treatments to stimulate muscles. Treating these conditions often requires a team of health care providers. These may include:  Your primary care provider.  Physical therapist.  Complementary health care providers, such as massage therapists or acupuncturists.  Psychiatrist for cognitive behavioral therapy. Follow these instructions at home: Medicines  Take over-the-counter and prescription medicines only as told by your health care provider.  Do not drive or use heavy machinery while taking prescription pain medicine.  If you are taking prescription pain medicine, take actions to prevent or treat constipation. Your health care provider may recommend that you: ? Drink enough fluid to keep your urine pale yellow. ? Eat foods that are high in fiber, such as fresh fruits and vegetables, whole grains, and beans. ? Limit foods that are high in fat and processed sugars, such as fried or sweet foods. ? Take an over-the-counter or prescription medicine for constipation. Lifestyle   Exercise as directed by your health care provider or physical therapist.  Practice relaxation techniques to control your stress. You may want to try: ? Biofeedback. ? Visual imagery. ? Hypnosis. ? Muscle relaxation. ? Yoga. ? Meditation.  Maintain a healthy lifestyle. This includes eating a healthy diet and getting enough sleep.  Do not use any products that contain nicotine or tobacco, such as cigarettes and e-cigarettes. If you need help quitting, ask your health care provider. General instructions  Talk to your  health care provider about complementary treatments, such as acupuncture or massage.  Consider joining a support group with others who are diagnosed with this condition.  Do not do activities that stress or strain your muscles. This includes repetitive motions and heavy  lifting.  Keep all follow-up visits as told by your health care provider. This is important. Where to find more information  National Fibromyalgia Association: www.fmaware.[place]: www.arthritis.org  American Chronic Pain Association: www.theacpa.org Contact a health care provider if:  You have new symptoms.  Your symptoms get worse or your pain is severe.  You have side effects from your medicines.  You have trouble sleeping.  Your condition is causing depression or anxiety. Summary  Myofascial pain syndrome and fibromyalgia are pain disorders.  Myofascial pain syndrome has tender points in the muscle that will cause pain when pressed (trigger points). Fibromyalgia also has muscle pains and tenderness that come and go, but this condition is often associated with fatigue and sleep disturbances.  Fibromyalgia and myofascial pain syndrome are not the same but often occur together, causing pain and fatigue that make day-to-day activities difficult.  Treatment for fibromyalgia includes taking medicines to relax the muscles and medicines for pain, depression, or seizures. Treatment for myofascial pain syndrome includes taking medicines for pain, cooling and stretching of muscles, and injecting medicines into trigger points.  Follow your health care provider's instructions for taking medicines and maintaining a healthy lifestyle. This information is not intended to replace advice given to you by your health care provider. Make sure you discuss any questions you have with your health care provider. Document Revised: 10/18/2018 Document Reviewed: 07/11/2017 Elsevier Patient Education  2020 [employer].

## 2020-06-07 ENCOUNTER — Telehealth: Payer: Self-pay

## 2020-06-07 LAB — ANA: Anti Nuclear Antibody (ANA): NEGATIVE

## 2020-06-07 LAB — ANTI-DNA ANTIBODY, DOUBLE-STRANDED: ds DNA Ab: 2 [IU]/mL

## 2020-06-07 LAB — SJOGRENS SYNDROME-B EXTRACTABLE NUCLEAR ANTIBODY: SSB (La) (ENA) Antibody, IgG: <1 AI

## 2020-06-07 LAB — RNP ANTIBODY: Ribonucleic Protein(ENA) Antibody, IgG: 2.1 AI — AB

## 2020-06-07 LAB — SJOGRENS SYNDROME-A EXTRACTABLE NUCLEAR ANTIBODY: SSA (Ro) (ENA) Antibody, IgG: <1 AI

## 2020-06-07 LAB — ANTI-SMITH ANTIBODY: ENA SM Ab Ser-aCnc: <1 AI

## 2020-06-07 LAB — ANTI-SCLERODERMA ANTIBODY: Scleroderma (Scl-70) (ENA) Antibody, IgG: <1 AI

## 2020-06-07 LAB — SEDIMENTATION RATE: Sed Rate: 14 mm/h (ref 0–20)

## 2020-06-07 NOTE — Telephone Encounter (Signed)
Discussed with patient, detailed in associated result note from today.

## 2020-06-07 NOTE — Progress Notes (Signed)
I spoke with Lindsay Walsh about her results. Her tests were negative including the ANA test except for the RNP antibody test positive with value of 2.1. Her sedimentation rate was 14 not indicative of active inflammation. No tests related to lupus were positive. I am not sure about the clinical significance so we will follow up early next month probably repeat the one RNP test and also exam for myositis, skin, or capillary changes.

## 2020-06-07 NOTE — Telephone Encounter (Signed)
Patient called requesting a return call regarding her labwork results.  Patient states over the weekend she was able to view the results of her labwork on her mychart and has questions regarding one of the tests.

## 2020-06-14 ENCOUNTER — Other Ambulatory Visit (INDEPENDENT_AMBULATORY_CARE_PROVIDER_SITE_OTHER): Payer: Self-pay | Admitting: Internal Medicine

## 2020-06-15 NOTE — Progress Notes (Signed)
Office Visit Note  Patient: Lindsay Walsh             Date of Birth: 27-Nov-1970           MRN: [id number]             PCP: Doree Albee, MD Referring: Doree Albee, MD Visit Date: 06/16/2020  Subjective:  Follow-up and Joint Pain   History of Present Illness: Lindsay Walsh is a 49 y.o. female here for follow up with hand pain and sometimes all over pain, positive RNP antibodies here for repeat assessment for inflammatory joint pain, nailfold capillaroscopy, and positive RNP test. She continues to have pain in multiple places including her hands, knees, also neck and upper back. No signficant changes since her initial visit.    Review of Systems  Constitutional: Positive for fatigue.  HENT: Positive for mouth dryness and nose dryness. Negative for mouth sores.   Eyes: Positive for dryness. Negative for pain, itching and visual disturbance.  Respiratory: Positive for shortness of breath. Negative for cough, hemoptysis and difficulty breathing.   Cardiovascular: Positive for irregular heartbeat and swelling in legs/feet. Negative for chest pain and palpitations.  Gastrointestinal: Positive for constipation and diarrhea. Negative for abdominal pain and blood in stool.  Endocrine: Negative for increased urination.  Genitourinary: Negative for painful urination.  Musculoskeletal: Positive for arthralgias, joint pain, joint swelling, myalgias, muscle weakness, morning stiffness, muscle tenderness and myalgias.  Skin: Negative for color change, rash and redness.  Allergic/Immunologic: Negative for susceptible to infections.  Neurological: Positive for dizziness, numbness, headaches and weakness. Negative for memory loss.  Hematological: Negative for swollen glands.  Psychiatric/Behavioral: Negative for confusion and sleep disturbance.    PMFS History:  Patient Active Problem List   Diagnosis Date Noted  . Bilateral hand pain 06/02/2020  . OAB (overactive bladder)  11/05/2019  . Recurrent UTI 11/05/2019  . Diabetic gastroparesis ([place]) 08/21/2019  . Nausea and vomiting 07/15/2019  . Diarrhea 07/15/2019  . Abnormal finding on urinalysis 07/15/2019  . Morbid (severe) obesity due to excess calories ([place])   . Essential hypertension   . Major depressive disorder, single episode, unspecified   . Restless legs syndrome   . Sleep apnea   . Fibromyalgia   . Hereditary and idiopathic neuropathy, unspecified   . Nonspecific mesenteric lymphadenitis   . Pain in right foot   . Type 2 diabetes mellitus with hyperglycemia ([place])   . Hypothyroidism 08/22/2018  . DM type 2 causing vascular disease ([place]) 08/21/2018  . Mixed hyperlipidemia 08/21/2018  . Essential hypertension, benign 08/21/2018  . Influenza A 08/10/2018  . SIRS (systemic inflammatory response syndrome) ([place]) 08/10/2018  . Essential hypertension, malignant 03/07/2015  . Cerebral infarction ([place]) 03/05/2015  . Dyspareunia 05/06/2014  . Obesity 09/30/2013  . Bilateral hip pain 07/16/2013  . Chronic pain syndrome 07/16/2013  . Degenerative disc disease, lumbar 07/16/2013  . Greater trochanteric bursitis of both hips 07/16/2013  . Nightmares associated with chronic post-traumatic stress disorder 10/18/2012  . Sprain of foot 03/14/2012  . Weakness 01/29/2012  . IBS (irritable bowel syndrome) 08/01/2011  . Gastroesophageal reflux disease   . Hypertension   . Depression   . Chest pain   . Fasting hyperglycemia   . TOBACCO ABUSE 04/28/2010  . NARCOTIC ABUSE 04/28/2010  . PULMONARY NODULE 04/28/2010  . Autoimmune hepatitis ([place]) 04/28/2010  . Myalgia and myositis 01/06/2009    Past Medical History:  Diagnosis Date  . Acid reflux   .  Allergic rhinitis due to pollen   . Angina    took SL nitro one week ago  . Anxiety   . Arthritis    back  . Arthritis   . Autoimmune hepatitis ([place])   . Back pain   . Chest pain    Normal stress echo in 2011; PVCs; pedal edema  . CVA (cerebral  infarction)   . Depression   . Depression   . Diabetes mellitus without complication ([place])   . Dizziness and giddiness   . Dyspareunia 05/06/2014  . Dysrhythmia    palpatations  . Essential (primary) hypertension   . Fasting hyperglycemia   . Fibromyalgia   . Fibromyalgia   . Gastroesophageal reflux disease    Chronic abdominal pain; gastroparesis; globus hystericus; irritable bowel syndrome  . Glaucoma   . Hereditary and idiopathic neuropathy, unspecified   . Hyperlipidemia   . Hypertension   . Impaired glucose tolerance   . Irritable bowel syndrome   . Major depressive disorder, single episode, unspecified   . Morbid (severe) obesity due to excess calories ([place])   . Narcotic dependence ([place])   . Nausea   . Nonspecific mesenteric lymphadenitis   . Overweight(278.02)   . Pain in limb   . Pain in right foot   . PTSD (post-traumatic stress disorder)   . Pulmonary nodule   . Restless legs syndrome   . Shortness of breath    with exertion  . Sleep apnea   . Sleep apnea    on CPAP machine  . Sleep apnea, unspecified   . Stroke ([place]) 2016  . Tobacco abuse    one pack per day; 35 pack years  . Type 2 diabetes mellitus with hyperglycemia ([facility])     Family History  Problem Relation Age of Onset  . Diabetes Mother   . Hypertension Mother   . Anxiety disorder Mother   . Depression Mother   . Drug abuse Mother   . Heart disease Father   . Diabetes Father   . Anxiety disorder Father   . Alcohol abuse Father   . Depression Father   . OCD Father   . Thyroid disease Sister   . Healthy Brother   . Healthy Daughter   . Healthy Son   . ADD / ADHD Son   . Alcohol abuse Paternal Grandfather   . Depression Paternal Grandfather   . Cancer Paternal Grandfather        lung,skin  . Tuberculosis Paternal Grandfather   . Seizures Paternal Grandmother   . Lupus Paternal Grandmother   . ADD / ADHD Son   . Anesthesia problems Neg Hx   . Malignant hyperthermia Neg Hx   .  Pseudochol deficiency Neg Hx   . Hypotension Neg Hx   . Bipolar disorder Neg Hx   . Dementia Neg Hx   . Paranoid behavior Neg Hx   . Schizophrenia Neg Hx   . Sexual abuse Neg Hx   . Physical abuse Neg Hx    Past Surgical History:  Procedure Laterality Date  . ABDOMINAL HYSTERECTOMY     TAH&BSO  . BACK SURGERY    . BLADDER SUSPENSION  09/12/2011   Procedure: TRANSVAGINAL TAPE (TVT) PROCEDURE;  Surgeon: [name], MD;  Location: [facility];  Service: Urology;  Laterality: N/A;  . CESAREAN SECTION     X3  . CHOLECYSTECTOMY    . ESOPHAGOGASTRODUODENOSCOPY ENDOSCOPY     "throat stretched" per pt.  Marland Kitchen LIVER BIOPSY  x4  . LUMBAR EPIDURAL INJECTION  01/2012  . TOTAL ABDOMINAL HYSTERECTOMY W/ BILATERAL SALPINGOOPHORECTOMY    . TUBAL LIGATION     Bilateral  . UPPER GASTROINTESTINAL ENDOSCOPY  09/13/2010   Social History   Social History Narrative   Divorced since 1994.Lives with boyfriend of 11 years.On disability.   Immunization History  Administered Date(s) Administered  . Influenza Inj Mdck Quad Pf 04/20/2016  . Influenza Split 04/07/2013, 05/17/2017  . Influenza,inj,Quad PF,6+ Mos 05/17/2017, 04/04/2018, 03/31/2019, 04/28/2019  . Influenza-Unspecified 04/17/2012, 03/20/2014, 08/09/2014, 05/06/2015  . Moderna SARS-COVID-2 Vaccination 02/06/2020, 03/05/2020  . Pneumococcal Conjugate-13 12/18/2019  . Pneumococcal Polysaccharide-23 04/20/2016     Objective: Vital Signs: BP 121/80 (BP Location: Right Arm, Patient Position: Sitting, Cuff Size: Normal)   Pulse 77   Ht 5' (1.524 m)   Wt 201 lb (91.2 kg)   BMI 39.26 kg/m    Physical Exam Constitutional:      Appearance: She is obese.  HENT:     Right Ear: External ear normal.     Left Ear: External ear normal.     Mouth/Throat:     Mouth: Mucous membranes are moist.     Pharynx: Oropharynx is clear.  Cardiovascular:     Rate and Rhythm: Normal rate and regular rhythm.  Pulmonary:     Effort: Pulmonary effort is  normal.     Breath sounds: Normal breath sounds.  Skin:    General: Skin is warm and dry.     Findings: No rash.     Comments: Nailfold capillaroscopy appears normal  Neurological:     General: No focal deficit present.     Mental Status: She is alert.      Musculoskeletal Exam:  Neck full range of motion right sided posterior neck tenderness and increased muscle tone Shoulder, elbow full range of motion no tenderness or swelling Right hand tenderness over third MCP joint no swelling or warmth, no left hand abnormalities Paraspinal tenderness to palpation over upper back bilaterally Normal hip internal and external rotation without pain, left lateral hip tenderness to palpation Knees, ankles full range of motion no tenderness or swelling    Investigation: No additional findings.  Imaging: XR Hand 2 View Left  Result Date: 06/02/2020 X-ray left hand 2 views Radiocarpal carpal joint spaces appear normal.  Normal MCP, PIP, DIP joint spaces.  Bone mineralization appears normal.  No soft tissue swelling seen. Impression No significant degenerative inflammatory arthritis abnormality seen  XR Hand 2 View Right  Result Date: 06/02/2020 X-ray right hand 2 views Radiocarpal and carpal joint spaces appear normal.  Normal MCP, PIP, DIP joint spaces.  Possible small marginal osteophytes seen.  Bone mineralization appears normal.  No soft tissue swelling seen. Impression No significant degenerative or inflammatory arthritis abnormality seen   Recent Labs: Lab Results  Component Value Date   WBC 11.8 (H) 01/25/2020   HGB 14.5 01/25/2020   PLT 316 01/25/2020   NA 139 01/25/2020   K 4.0 01/25/2020   CL 103 01/25/2020   CO2 25 01/25/2020   GLUCOSE 183 (H) 01/25/2020   BUN 13 01/25/2020   CREATININE 0.68 01/25/2020   BILITOT 0.8 01/25/2020   ALKPHOS 107 01/25/2020   AST 24 01/25/2020   ALT 25 01/25/2020   PROT 8.2 (H) 01/25/2020   ALBUMIN 4.4 01/25/2020   CALCIUM 8.9 01/25/2020    GFRAA >60 01/25/2020    Speciality Comments: No specialty comments available.  Procedures:  No procedures performed Allergies: Doxycycline, Fentanyl,  Amlodipine, Bupropion, Cefoxitin, Ceftin [cefuroxime axetil], Iodinated diagnostic agents, Iohexol, Norvasc [amlodipine besylate], Wellbutrin [bupropion hcl], Latex, and Tape   Assessment / Plan:     Visit Diagnoses: Fibromyalgia  Current pain out of proportion to exam and radiographic findings seems consistent with fibromyalgia. Positive RNP antibody test but absent raynaud's and normal nailfold capillaroscopy no suspicion for MCTD. She also has significant fatigue complaint. The neck and upper back also has areas of increased muscle tone and tenderness seems like myofascial pain. Provided exercises to stretch and online reference for fibromyalgia self care activities.  Autoimmune hepatitis ([place])  Serology not highly suggestive for active inflammatory disease and no synovitis on exam. She is on Imuran for autoimmune hepatitis I do not see active disease needing escalation of DMARD treatment.  Orders: No orders of the defined types were placed in this encounter.  No orders of the defined types were placed in this encounter.    Follow-Up Instructions: Return if symptoms worsen or fail to improve.   Collier Salina, MD  Note - This record has been created using [employer].  Chart creation errors have been sought, but may not always  have been located. Such creation errors do not reflect on  the standard of medical care.

## 2020-06-16 ENCOUNTER — Ambulatory Visit (INDEPENDENT_AMBULATORY_CARE_PROVIDER_SITE_OTHER): Payer: Medicare Other | Admitting: Internal Medicine

## 2020-06-16 ENCOUNTER — Other Ambulatory Visit: Payer: Self-pay

## 2020-06-16 ENCOUNTER — Encounter: Payer: Self-pay | Admitting: Internal Medicine

## 2020-06-16 VITALS — BP 121/80 | HR 77 | Ht 60.0 in | Wt 201.0 lb

## 2020-06-16 DIAGNOSIS — M797 Fibromyalgia: Secondary | ICD-10-CM

## 2020-06-16 DIAGNOSIS — K754 Autoimmune hepatitis: Secondary | ICD-10-CM | POA: Diagnosis not present

## 2020-06-16 NOTE — Patient Instructions (Addendum)
I do not see any evidence of autoimmune disease activity causing your current joint pain. You do have osteoarthritis and myofascial pain, which can often be worse during cold weather.   I recommend checking out the [place] webiste about self-care for fibromyalgia symptoms including muscle and joint pain as well as fatigue: [email or web address]   I also recommend doing the following stretches at least twice per day      Shoulder shrugs  Keeping your arms by your side, rotate the shoulders in a wide circular motion 10 to 15 times in each direction. Make sure to stretch after you exercise. The best way to stretch the levator scapulae is to tilt the head at a 45-degree angle while simultaneously lowering the opposing shoulder. Repeat both sides, and hold the stretch for at least 30 seconds. ``

## 2020-06-17 ENCOUNTER — Other Ambulatory Visit (INDEPENDENT_AMBULATORY_CARE_PROVIDER_SITE_OTHER): Payer: Self-pay | Admitting: Internal Medicine

## 2020-06-24 DIAGNOSIS — I10 Essential (primary) hypertension: Secondary | ICD-10-CM | POA: Diagnosis not present

## 2020-06-24 DIAGNOSIS — E785 Hyperlipidemia, unspecified: Secondary | ICD-10-CM | POA: Diagnosis not present

## 2020-06-24 DIAGNOSIS — Z87891 Personal history of nicotine dependence: Secondary | ICD-10-CM | POA: Diagnosis not present

## 2020-06-24 DIAGNOSIS — G8929 Other chronic pain: Secondary | ICD-10-CM | POA: Diagnosis not present

## 2020-06-24 DIAGNOSIS — Z8673 Personal history of transient ischemic attack (TIA), and cerebral infarction without residual deficits: Secondary | ICD-10-CM | POA: Diagnosis not present

## 2020-06-24 DIAGNOSIS — E039 Hypothyroidism, unspecified: Secondary | ICD-10-CM | POA: Diagnosis not present

## 2020-06-24 DIAGNOSIS — M2550 Pain in unspecified joint: Secondary | ICD-10-CM | POA: Diagnosis not present

## 2020-06-24 DIAGNOSIS — M1990 Unspecified osteoarthritis, unspecified site: Secondary | ICD-10-CM | POA: Diagnosis not present

## 2020-06-24 DIAGNOSIS — M797 Fibromyalgia: Secondary | ICD-10-CM | POA: Diagnosis not present

## 2020-06-24 DIAGNOSIS — K754 Autoimmune hepatitis: Secondary | ICD-10-CM | POA: Diagnosis not present

## 2020-06-24 DIAGNOSIS — Z23 Encounter for immunization: Secondary | ICD-10-CM | POA: Diagnosis not present

## 2020-06-24 DIAGNOSIS — I2510 Atherosclerotic heart disease of native coronary artery without angina pectoris: Secondary | ICD-10-CM | POA: Diagnosis not present

## 2020-07-14 ENCOUNTER — Ambulatory Visit: Payer: Medicare Other | Admitting: Physician Assistant

## 2020-07-22 ENCOUNTER — Other Ambulatory Visit (INDEPENDENT_AMBULATORY_CARE_PROVIDER_SITE_OTHER): Payer: Self-pay | Admitting: Internal Medicine

## 2020-08-03 ENCOUNTER — Ambulatory Visit (INDEPENDENT_AMBULATORY_CARE_PROVIDER_SITE_OTHER): Payer: Medicare Other | Admitting: Internal Medicine

## 2020-08-03 ENCOUNTER — Other Ambulatory Visit: Payer: Self-pay

## 2020-08-03 ENCOUNTER — Encounter (INDEPENDENT_AMBULATORY_CARE_PROVIDER_SITE_OTHER): Payer: Self-pay | Admitting: Internal Medicine

## 2020-08-03 VITALS — BP 112/74 | HR 85 | Temp 96.9°F | Ht 60.0 in | Wt 192.4 lb

## 2020-08-03 DIAGNOSIS — E1159 Type 2 diabetes mellitus with other circulatory complications: Secondary | ICD-10-CM

## 2020-08-03 DIAGNOSIS — I10 Essential (primary) hypertension: Secondary | ICD-10-CM | POA: Diagnosis not present

## 2020-08-03 DIAGNOSIS — E039 Hypothyroidism, unspecified: Secondary | ICD-10-CM

## 2020-08-03 NOTE — Progress Notes (Signed)
Metrics: Intervention Frequency ACO  Documented Smoking Status Yearly  Screened one or more times in 24 months  Cessation Counseling or  Active cessation medication Past 24 months  Past 24 months   Guideline developer: UpToDate (See UpToDate for funding source) Date Released: 2014       Wellness Office Visit  Subjective:  Patient ID: Lindsay Walsh, female    DOB: [birth year]  Age: 50 y.o. MRN: [id number]  CC: This lady comes in for follow-up of her diabetes, hypothyroidism, hypertension and morbid obesity. HPI  She tells me that her blood sugar levels have been elevated significantly to over 400 recently.  She has such a high craving for sugar lately.  She takes Metformin at a low dose twice a day. She also has a history of fibromyalgia. She feels that the desiccated NP thyroid seems to have helped her energy levels and made her feel better. She continues on lisinopril for hypertension. Past Medical History:  Diagnosis Date  . Acid reflux   . Allergic rhinitis due to pollen   . Angina    took SL nitro one week ago  . Anxiety   . Arthritis    back  . Arthritis   . Autoimmune hepatitis ([place])   . Back pain   . Chest pain    Normal stress echo in 2011; PVCs; pedal edema  . CVA (cerebral infarction)   . Depression   . Depression   . Diabetes mellitus without complication ([place])   . Dizziness and giddiness   . Dyspareunia 05/06/2014  . Dysrhythmia    palpatations  . Essential (primary) hypertension   . Fasting hyperglycemia   . Fibromyalgia   . Fibromyalgia   . Gastroesophageal reflux disease    Chronic abdominal pain; gastroparesis; globus hystericus; irritable bowel syndrome  . Glaucoma   . Hereditary and idiopathic neuropathy, unspecified   . Hyperlipidemia   . Hypertension   . Impaired glucose tolerance   . Irritable bowel syndrome   . Major depressive disorder, single episode, unspecified   . Morbid (severe) obesity due to excess calories ([place])   . Narcotic  dependence ([place])   . Nausea   . Nonspecific mesenteric lymphadenitis   . Overweight(278.02)   . Pain in limb   . Pain in right foot   . PTSD (post-traumatic stress disorder)   . Pulmonary nodule   . Restless legs syndrome   . Shortness of breath    with exertion  . Sleep apnea   . Sleep apnea    on CPAP machine  . Sleep apnea, unspecified   . Stroke ([place]) 2016  . Tobacco abuse    one pack per day; 35 pack years  . Type 2 diabetes mellitus with hyperglycemia [facility])    Past Surgical History:  Procedure Laterality Date  . ABDOMINAL HYSTERECTOMY     TAH&BSO  . BACK SURGERY    . BLADDER SUSPENSION  09/12/2011   Procedure: TRANSVAGINAL TAPE (TVT) PROCEDURE;  Surgeon: [name], MD;  Location: [facility];  Service: Urology;  Laterality: N/A;  . CESAREAN SECTION     X3  . CHOLECYSTECTOMY    . ESOPHAGOGASTRODUODENOSCOPY ENDOSCOPY     "throat stretched" per pt.  Marland Kitchen LIVER BIOPSY     x4  . LUMBAR EPIDURAL INJECTION  01/2012  . TOTAL ABDOMINAL HYSTERECTOMY W/ BILATERAL SALPINGOOPHORECTOMY    . TUBAL LIGATION     Bilateral  . UPPER GASTROINTESTINAL ENDOSCOPY  09/13/2010     Family History  Problem Relation Age of Onset  . Diabetes Mother   . Hypertension Mother   . Anxiety disorder Mother   . Depression Mother   . Drug abuse Mother   . Heart disease Father   . Diabetes Father   . Anxiety disorder Father   . Alcohol abuse Father   . Depression Father   . OCD Father   . Thyroid disease Sister   . Healthy Brother   . Healthy Daughter   . Healthy Son   . ADD / ADHD Son   . Alcohol abuse Paternal Grandfather   . Depression Paternal Grandfather   . Cancer Paternal Grandfather        lung,skin  . Tuberculosis Paternal Grandfather   . Seizures Paternal Grandmother   . Lupus Paternal Grandmother   . ADD / ADHD Son   . Anesthesia problems Neg Hx   . Malignant hyperthermia Neg Hx   . Pseudochol deficiency Neg Hx   . Hypotension Neg Hx   . Bipolar disorder Neg Hx   .  Dementia Neg Hx   . Paranoid behavior Neg Hx   . Schizophrenia Neg Hx   . Sexual abuse Neg Hx   . Physical abuse Neg Hx     Social History   Social History Narrative   Divorced since 1994.Lives with boyfriend of 11 years.On disability.   Social History   Tobacco Use  . Smoking status: Former Smoker    Packs/day: 1.50    Years: 25.00    Pack years: 37.50    Types: Cigarettes    Quit date: 04/25/2013    Years since quitting: 7.2  . Smokeless tobacco: Never Used  Substance Use Topics  . Alcohol use: No    Current Meds  Medication Sig  . ACCU-CHEK GUIDE test strip TEST ONCE DAILY  . albuterol (PROVENTIL) (2.5 MG/3ML) 0.083% nebulizer solution Inhale into the lungs as needed.  Marland Kitchen amphetamine-dextroamphetamine (ADDERALL) 20 MG tablet Take 1 tablet (20 mg total) by mouth 2 (two) times daily.  Marland Kitchen amphetamine-dextroamphetamine (ADDERALL) 20 MG tablet Take 1 tablet (20 mg total) by mouth 2 (two) times daily.  Marland Kitchen amphetamine-dextroamphetamine (ADDERALL) 20 MG tablet Take 1 tablet (20 mg total) by mouth 2 (two) times daily.  . Ascorbic Acid (VITAMIN C) 1000 MG tablet Take 500 mg by mouth every morning.   Marland Kitchen aspirin 325 MG EC tablet Take 325 mg by mouth daily.  Marland Kitchen azaTHIOprine (IMURAN) 50 MG tablet TAKE 1 TABLET(50 MG) BY MOUTH DAILY  . bimatoprost (LUMIGAN) 0.01 % SOLN Place 1 drop into both eyes at bedtime.  . calcium-vitamin D (OSCAL WITH D) 500-200 MG-UNIT tablet Take 1 tablet by mouth 2 (two) times daily.  . cycloSPORINE (RESTASIS) 0.05 % ophthalmic emulsion Place 1 drop into both eyes daily.  Marland Kitchen dicyclomine (BENTYL) 10 MG capsule Take 1 capsule (10 mg total) by mouth 2 (two) times daily as needed.  . DULoxetine (CYMBALTA) 60 MG capsule Take 1 capsule (60 mg total) by mouth 2 (two) times daily.  Marland Kitchen esomeprazole (NEXIUM) 40 MG capsule TAKE 1 CAPSULE BY MOUTH EVERY MORNING  . fexofenadine (ALLEGRA) 60 MG tablet Take 1 tablet by mouth 2 (two) times daily as needed for allergies.   .  furosemide (LASIX) 40 MG tablet Take 40 mg by mouth every other day.  . gabapentin (NEURONTIN) 300 MG capsule Take 300 mg by mouth at bedtime.  . hydrALAZINE (APRESOLINE) 100 MG tablet Take 100 mg by mouth 4 (four) times daily.   Marland Kitchen  ibuprofen (ADVIL) 800 MG tablet as needed.  Marland Kitchen lisinopril (ZESTRIL) 40 MG tablet TAKE 1 TABLET(40 MG) BY MOUTH DAILY  . meclizine (ANTIVERT) 12.5 MG tablet Take 1 tablet (12.5 mg total) by mouth 3 (three) times daily as needed for dizziness.  . metFORMIN (GLUCOPHAGE) 500 MG tablet TAKE 1 TABLET BY MOUTH ONCE DAILY  . metoprolol succinate (TOPROL-XL) 100 MG 24 hr tablet TAKE 1 TABLET(100 MG) BY MOUTH DAILY WITH FOOD (Patient taking differently: Take 150 mg by mouth daily.)  . mirabegron ER (MYRBETRIQ) 50 MG TB24 tablet Take 1 tablet (50 mg total) by mouth daily.  . Multiple Vitamins-Minerals (CENTRUM) tablet Take 1 tablet by mouth daily.  . nitroGLYCERIN (NITROSTAT) 0.4 MG SL tablet as needed.  . NP THYROID 60 MG tablet TAKE 1 TABLET(60 MG) BY MOUTH DAILY BEFORE BREAKFAST  . ondansetron (ZOFRAN) 4 MG tablet Take 1 tablet (4 mg total) by mouth every 8 (eight) hours as needed for nausea or vomiting.  . pramipexole (MIRAPEX) 1 MG tablet Take 1 mg by mouth daily.  . prazosin (MINIPRESS) 2 MG capsule Take 1 capsule (2 mg total) by mouth at bedtime.  Marland Kitchen PRESCRIPTION MEDICATION Take 1 tablet by mouth daily. Take 1 tablet by mouth daily before food and at bedtime.   This is MOTILLIUM ( DOMPERIDONE 10 mg) Tablet  . Probiotic Product (RESTORA PO) Take by mouth daily.   . rosuvastatin (CRESTOR) 40 MG tablet rosuvastatin 40 mg tablet      Depression screen [facility] 2/9 08/03/2020 10/20/2019 07/09/2019 07/19/2017  Decreased Interest 2 0 0 0  Down, Depressed, Hopeless 2 0 0 1  PHQ - 2 Score 4 0 0 1  Altered sleeping 3 - - -  Tired, decreased energy 3 - - -  Change in appetite 3 - - -  Feeling bad or failure about yourself  1 - - -  Trouble concentrating 2 - - -  Moving slowly or  fidgety/restless 0 - - -  Suicidal thoughts 0 - - -  PHQ-9 Score 16 - - -  Difficult doing work/chores Somewhat difficult - - -  Some recent data might be hidden     Objective:   Today's Vitals: BP 112/74   Pulse 85   Temp (!) 96.9 F (36.1 C) (Temporal)   Ht 5' (1.524 m)   Wt 192 lb 6.4 oz (87.3 kg)   SpO2 98%   BMI 37.58 kg/m  Vitals with BMI 08/03/2020 06/16/2020 06/02/2020  Height 5\' 0"  5\' 0"  -  Weight 192 lbs 6 oz 201 lbs -  BMI 93.81 01.75 -  Systolic 102 585 277  Diastolic 74 80 67  Pulse 85 77 -  Some encounter information is confidential and restricted. Go to Review Flowsheets activity to see all data.     Physical Exam  She remains morbidly obese although she has lost weight of about 5 pounds since last time I saw her.     Assessment   1. Acquired hypothyroidism   2. DM type 2 causing vascular disease ([place])   3. Essential hypertension   4. Morbid obesity ([place])       Tests ordered Orders Placed This Encounter  Procedures  . COMPLETE METABOLIC PANEL WITH GFR  . Hemoglobin A1c  . T3, free  . TSH     Plan: 1. She will continue with the current dose of desiccated NP thyroid and we will check thyroid function test today to see if we need to adjust medication further.  2. She will continue with Metformin and I have told her that most likely will maximize the dose of Metformin and probably add another medication, perhaps Trulicity which she can take once a week.  We will see what the A1c shows. 3. She will continue with lisinopril for hypertension and we will check renal function.  Blood pressure readings have been in good control. 4. Follow-up in 6 weeks for close monitoring.   No orders of the defined types were placed in this encounter.   Doree Albee, MD

## 2020-08-04 ENCOUNTER — Telehealth (INDEPENDENT_AMBULATORY_CARE_PROVIDER_SITE_OTHER): Payer: Self-pay

## 2020-08-04 ENCOUNTER — Other Ambulatory Visit (INDEPENDENT_AMBULATORY_CARE_PROVIDER_SITE_OTHER): Payer: Self-pay | Admitting: Internal Medicine

## 2020-08-04 LAB — COMPLETE METABOLIC PANEL WITH GFR
AG Ratio: 1.5 (calc) (ref 1.0–2.5)
ALT: 21 U/L (ref 6–29)
AST: 19 U/L (ref 10–35)
Albumin: 4 g/dL (ref 3.6–5.1)
Alkaline phosphatase (APISO): 134 U/L (ref 37–153)
BUN: 11 mg/dL (ref 7–25)
CO2: 23 mmol/L (ref 20–32)
Calcium: 9.3 mg/dL (ref 8.6–10.4)
Chloride: 96 mmol/L — ABNORMAL LOW (ref 98–110)
Creat: 0.79 mg/dL (ref 0.50–1.05)
GFR, Est African American: 101 mL/min/{1.73_m2} (ref >=60)
GFR, Est Non African American: 87 mL/min/{1.73_m2} (ref >=60)
Globulin: 2.7 g/dL (calc) (ref 1.9–3.7)
Glucose, Bld: 556 mg/dL (ref 65–139)
Potassium: 5 mmol/L (ref 3.5–5.3)
Sodium: 132 mmol/L — ABNORMAL LOW (ref 135–146)
Total Bilirubin: 0.4 mg/dL (ref 0.2–1.2)
Total Protein: 6.7 g/dL (ref 6.1–8.1)

## 2020-08-04 LAB — TSH: TSH: 2.79 mIU/L

## 2020-08-04 LAB — T3, FREE: T3, Free: 3 pg/mL (ref 2.3–4.2)

## 2020-08-04 LAB — HEMOGLOBIN A1C
Hgb A1c MFr Bld: 11.4 % of total Hgb — ABNORMAL HIGH (ref <5.7)
Mean Plasma Glucose: 280 mg/dL
eAG (mmol/L): 15.5 mmol/L

## 2020-08-04 MED ORDER — METFORMIN HCL 1000 MG PO TABS: 1000.0000 mg | ORAL_TABLET | Freq: Two times a day (BID) | ORAL | 3 refills | Status: DC

## 2020-08-04 MED ORDER — TRULICITY 0.75 MG/0.5ML ~~LOC~~ SOAJ: 0.7500 mg | SUBCUTANEOUS | 3 refills | Status: DC

## 2020-08-04 MED ORDER — ACCU-CHEK GUIDE VI STRP: 1.0000 | ORAL_STRIP | Freq: Two times a day (BID) | 3 refills | Status: AC

## 2020-08-04 NOTE — Telephone Encounter (Signed)
Called patient and left a detailed voice message for her to call back to discuss lab results.

## 2020-08-04 NOTE — Telephone Encounter (Signed)
Called patient and gave her the lab results per Dr. Anastasio Champion. Patient verbalized an understanding. Patient requested a refill of her test strips for her to be able to test 2 times daily or an extra time if she feels funny and needs to check her blood sugar levels. Patient is asking if you can send in 100 strips to the pharmacy and instructions for her to check her BG at least 2 times daily.  ACCU-CHEK GUIDE test strip  Last sent 50 strips on 07/22/2020

## 2020-08-04 NOTE — Telephone Encounter (Signed)
Thanks I saw that and will address it with the patient.

## 2020-08-04 NOTE — Telephone Encounter (Signed)
Okay, I have sent this prescription for test strips to [facility] [address]

## 2020-08-04 NOTE — Telephone Encounter (Signed)
Diane from [employer] called and left a detailed voice message for a critical lab result for the patient with a reference # of [id number] R. Telephone number of [phone]  I called the number provided for [facility] and spoke to [facility] and he verbally gave me a Glucose reading of 556 for patients lab done on 08/03/2020.

## 2020-09-03 ENCOUNTER — Other Ambulatory Visit (INDEPENDENT_AMBULATORY_CARE_PROVIDER_SITE_OTHER): Payer: Self-pay | Admitting: Internal Medicine

## 2020-09-14 ENCOUNTER — Other Ambulatory Visit: Payer: Self-pay

## 2020-09-14 ENCOUNTER — Encounter (INDEPENDENT_AMBULATORY_CARE_PROVIDER_SITE_OTHER): Payer: Self-pay | Admitting: Internal Medicine

## 2020-09-14 ENCOUNTER — Ambulatory Visit (INDEPENDENT_AMBULATORY_CARE_PROVIDER_SITE_OTHER): Payer: Medicare Other | Admitting: Internal Medicine

## 2020-09-14 VITALS — BP 132/84 | HR 105 | Temp 97.7°F | Ht 60.0 in | Wt 192.8 lb

## 2020-09-14 DIAGNOSIS — I10 Essential (primary) hypertension: Secondary | ICD-10-CM

## 2020-09-14 DIAGNOSIS — E039 Hypothyroidism, unspecified: Secondary | ICD-10-CM | POA: Diagnosis not present

## 2020-09-14 DIAGNOSIS — E1159 Type 2 diabetes mellitus with other circulatory complications: Secondary | ICD-10-CM

## 2020-09-14 MED ORDER — TRULICITY 1.5 MG/0.5ML ~~LOC~~ SOAJ: 1.5000 mg | SUBCUTANEOUS | 3 refills | Status: DC

## 2020-09-14 MED ORDER — NP THYROID 90 MG PO TABS: 90.0000 mg | ORAL_TABLET | Freq: Every day | ORAL | 3 refills | Status: DC

## 2020-09-14 NOTE — Progress Notes (Signed)
Metrics: Intervention Frequency ACO  Documented Smoking Status Yearly  Screened one or more times in 24 months  Cessation Counseling or  Active cessation medication Past 24 months  Past 24 months   Guideline developer: UpToDate (See UpToDate for funding source) Date Released: 2014       Wellness Office Visit  Subjective:  Patient ID: Lindsay Walsh, female    DOB: 1971/07/03  Age: 50 y.o. MRN: [id number]  CC: This lady comes in for follow-up of uncontrolled diabetes, hypothyroidism, morbid obesity and hypertension. HPI  Since starting Trulicity, she feels that her blood sugar levels have improved, however still elevated.  She says that the lowest number has been 170 and the higher number has been in the 300 range.  This is better than what it was before.  She has also increase Metformin to double the dose that she was on before.  She is trying to watch her diet and eat healthier.  Past Medical History:  Diagnosis Date  . Acid reflux   . Allergic rhinitis due to pollen   . Angina    took SL nitro one week ago  . Anxiety   . Arthritis    back  . Arthritis   . Autoimmune hepatitis ([place])   . Back pain   . Chest pain    Normal stress echo in 2011; PVCs; pedal edema  . CVA (cerebral infarction)   . Depression   . Depression   . Diabetes mellitus without complication ([place])   . Dizziness and giddiness   . Dyspareunia 05/06/2014  . Dysrhythmia    palpatations  . Essential (primary) hypertension   . Fasting hyperglycemia   . Fibromyalgia   . Fibromyalgia   . Gastroesophageal reflux disease    Chronic abdominal pain; gastroparesis; globus hystericus; irritable bowel syndrome  . Glaucoma   . Hereditary and idiopathic neuropathy, unspecified   . Hyperlipidemia   . Hypertension   . Impaired glucose tolerance   . Irritable bowel syndrome   . Major depressive disorder, single episode, unspecified   . Morbid (severe) obesity due to excess calories ([place])   . Narcotic dependence  ([place])   . Nausea   . Nonspecific mesenteric lymphadenitis   . Overweight(278.02)   . Pain in limb   . Pain in right foot   . PTSD (post-traumatic stress disorder)   . Pulmonary nodule   . Restless legs syndrome   . Shortness of breath    with exertion  . Sleep apnea   . Sleep apnea    on CPAP machine  . Sleep apnea, unspecified   . Stroke ([place]) 2016  . Tobacco abuse    one pack per day; 35 pack years  . Type 2 diabetes mellitus with hyperglycemia [facility])    Past Surgical History:  Procedure Laterality Date  . ABDOMINAL HYSTERECTOMY     TAH&BSO  . BACK SURGERY    . BLADDER SUSPENSION  09/12/2011   Procedure: TRANSVAGINAL TAPE (TVT) PROCEDURE;  Surgeon: [name], MD;  Location: [facility];  Service: Urology;  Laterality: N/A;  . CESAREAN SECTION     X3  . CHOLECYSTECTOMY    . ESOPHAGOGASTRODUODENOSCOPY ENDOSCOPY     "throat stretched" per pt.  Marland Kitchen LIVER BIOPSY     x4  . LUMBAR EPIDURAL INJECTION  01/2012  . TOTAL ABDOMINAL HYSTERECTOMY W/ BILATERAL SALPINGOOPHORECTOMY    . TUBAL LIGATION     Bilateral  . UPPER GASTROINTESTINAL ENDOSCOPY  09/13/2010  Family History  Problem Relation Age of Onset  . Diabetes Mother   . Hypertension Mother   . Anxiety disorder Mother   . Depression Mother   . Drug abuse Mother   . Heart disease Father   . Diabetes Father   . Anxiety disorder Father   . Alcohol abuse Father   . Depression Father   . OCD Father   . Thyroid disease Sister   . Healthy Brother   . Healthy Daughter   . Healthy Son   . ADD / ADHD Son   . Alcohol abuse Paternal Grandfather   . Depression Paternal Grandfather   . Cancer Paternal Grandfather        lung,skin  . Tuberculosis Paternal Grandfather   . Seizures Paternal Grandmother   . Lupus Paternal Grandmother   . ADD / ADHD Son   . Anesthesia problems Neg Hx   . Malignant hyperthermia Neg Hx   . Pseudochol deficiency Neg Hx   . Hypotension Neg Hx   . Bipolar disorder Neg Hx   . Dementia Neg  Hx   . Paranoid behavior Neg Hx   . Schizophrenia Neg Hx   . Sexual abuse Neg Hx   . Physical abuse Neg Hx     Social History   Social History Narrative   Divorced since 1994.Lives with boyfriend of 11 years.On disability.   Social History   Tobacco Use  . Smoking status: Former Smoker    Packs/day: 1.50    Years: 25.00    Pack years: 37.50    Types: Cigarettes    Quit date: 04/25/2013    Years since quitting: 7.3  . Smokeless tobacco: Never Used  Substance Use Topics  . Alcohol use: No    Current Meds  Medication Sig  . Accu-Chek Softclix Lancets lancets CHECK SUGAR ONCE DAILY  . albuterol (PROVENTIL) (2.5 MG/3ML) 0.083% nebulizer solution Inhale into the lungs as needed.  Marland Kitchen amphetamine-dextroamphetamine (ADDERALL) 20 MG tablet Take 1 tablet (20 mg total) by mouth 2 (two) times daily.  Marland Kitchen amphetamine-dextroamphetamine (ADDERALL) 20 MG tablet Take 1 tablet (20 mg total) by mouth 2 (two) times daily.  Marland Kitchen amphetamine-dextroamphetamine (ADDERALL) 20 MG tablet Take 1 tablet (20 mg total) by mouth 2 (two) times daily.  . Ascorbic Acid (VITAMIN C) 1000 MG tablet Take 500 mg by mouth every morning.   Marland Kitchen aspirin 325 MG EC tablet Take 325 mg by mouth daily.  Marland Kitchen azaTHIOprine (IMURAN) 50 MG tablet TAKE 1 TABLET(50 MG) BY MOUTH DAILY  . bimatoprost (LUMIGAN) 0.01 % SOLN Place 1 drop into both eyes at bedtime.  . calcium-vitamin D (OSCAL WITH D) 500-200 MG-UNIT tablet Take 1 tablet by mouth 2 (two) times daily.  . cycloSPORINE (RESTASIS) 0.05 % ophthalmic emulsion Place 1 drop into both eyes daily.  Marland Kitchen dicyclomine (BENTYL) 10 MG capsule Take 1 capsule (10 mg total) by mouth 2 (two) times daily as needed.  . Dulaglutide (TRULICITY) 5.27 PO/2.4MP SOPN Inject 0.75 mg into the skin once a week.  . Dulaglutide (TRULICITY) 1.5 NT/6.1WE SOPN Inject 1.5 mg into the skin once a week.  . DULoxetine (CYMBALTA) 60 MG capsule Take 1 capsule (60 mg total) by mouth 2 (two) times daily.  Marland Kitchen esomeprazole  (NEXIUM) 40 MG capsule TAKE 1 CAPSULE BY MOUTH EVERY MORNING  . fexofenadine (ALLEGRA) 60 MG tablet Take 1 tablet by mouth 2 (two) times daily as needed for allergies.   . furosemide (LASIX) 40 MG tablet Take 40 mg  by mouth every other day.  . gabapentin (NEURONTIN) 300 MG capsule Take 300 mg by mouth at bedtime.  Marland Kitchen glucose blood (ACCU-CHEK GUIDE) test strip 1 each by Other route in the morning and at bedtime. for testing  . hydrALAZINE (APRESOLINE) 100 MG tablet Take 100 mg by mouth 4 (four) times daily.   Marland Kitchen ibuprofen (ADVIL) 800 MG tablet as needed.  Marland Kitchen lisinopril (ZESTRIL) 40 MG tablet TAKE 1 TABLET(40 MG) BY MOUTH DAILY  . meclizine (ANTIVERT) 12.5 MG tablet Take 1 tablet (12.5 mg total) by mouth 3 (three) times daily as needed for dizziness.  . metFORMIN (GLUCOPHAGE) 1000 MG tablet Take 1 tablet (1,000 mg total) by mouth 2 (two) times daily with a meal.  . metoprolol succinate (TOPROL-XL) 100 MG 24 hr tablet TAKE 1 TABLET(100 MG) BY MOUTH DAILY WITH FOOD (Patient taking differently: Take 150 mg by mouth daily.)  . mirabegron ER (MYRBETRIQ) 50 MG TB24 tablet Take 1 tablet (50 mg total) by mouth daily.  . Multiple Vitamins-Minerals (CENTRUM) tablet Take 1 tablet by mouth daily.  . nitroGLYCERIN (NITROSTAT) 0.4 MG SL tablet as needed.  . NP THYROID 60 MG tablet TAKE 1 TABLET(60 MG) BY MOUTH DAILY BEFORE BREAKFAST  . NP THYROID 90 MG tablet Take 1 tablet (90 mg total) by mouth daily.  . ondansetron (ZOFRAN) 4 MG tablet Take 1 tablet (4 mg total) by mouth every 8 (eight) hours as needed for nausea or vomiting.  . pramipexole (MIRAPEX) 1 MG tablet Take 1 mg by mouth daily.  . prazosin (MINIPRESS) 2 MG capsule Take 1 capsule (2 mg total) by mouth at bedtime.  Marland Kitchen PRESCRIPTION MEDICATION Take 1 tablet by mouth daily. Take 1 tablet by mouth daily before food and at bedtime.   This is MOTILLIUM ( DOMPERIDONE 10 mg) Tablet  . Probiotic Product (RESTORA PO) Take by mouth daily.   . rosuvastatin  (CRESTOR) 40 MG tablet rosuvastatin 40 mg tablet     Flowsheet Row Office Visit from 08/03/2020 in [facility]  PHQ-9 Total Score 16      Objective:   Today's Vitals: BP 132/84   Pulse (!) 105   Temp 97.7 F (36.5 C) (Temporal)   Ht 5' (1.524 m)   Wt 192 lb 12.8 oz (87.5 kg)   SpO2 96%   BMI 37.65 kg/m  Vitals with BMI 09/14/2020 08/03/2020 06/16/2020  Height 5\' 0"  5\' 0"  5\' 0"   Weight 192 lbs 13 oz 192 lbs 6 oz 201 lbs  BMI 37.65 00.17 49.44  Systolic 967 591 638  Diastolic 84 74 80  Pulse 466 85 77  Some encounter information is confidential and restricted. Go to Review Flowsheets activity to see all data.     Physical Exam   She looks systemically well.  She has not lost any weight.  Blood pressure is acceptable.    Assessment   1. DM type 2 causing vascular disease ([place])   2. Acquired hypothyroidism   3. Morbid obesity ([place])   4. Essential hypertension, benign       Tests ordered No orders of the defined types were placed in this encounter.    Plan: 1. Although her diabetes is improving, it is still uncontrolled.  I think we should increase the Trulicity dose to 1.5 mg once a week and I have sent a new prescription for this. 2. Also, we can further optimize thyroid which will help insulin resistance and thereby also help her diabetes.  To this end,  I am going to increase her NP thyroid to 90 mg daily and I have sent a new prescription. 3. She will continue to work on nutrition for her diabetes. 4. Follow-up in the first part of May and we will then do the blood work.   Meds ordered this encounter  Medications  . NP THYROID 90 MG tablet    Sig: Take 1 tablet (90 mg total) by mouth daily.    Dispense:  30 tablet    Refill:  3  . Dulaglutide (TRULICITY) 1.5 BR/4.9TX SOPN    Sig: Inject 1.5 mg into the skin once a week.    Dispense:  2 mL    Refill:  3    Nimish Luther Parody, MD

## 2020-10-06 ENCOUNTER — Telehealth (INDEPENDENT_AMBULATORY_CARE_PROVIDER_SITE_OTHER): Payer: Medicare Other | Admitting: Psychiatry

## 2020-10-06 ENCOUNTER — Other Ambulatory Visit: Payer: Self-pay

## 2020-10-06 ENCOUNTER — Encounter (HOSPITAL_COMMUNITY): Payer: Self-pay | Admitting: Psychiatry

## 2020-10-06 DIAGNOSIS — F4312 Post-traumatic stress disorder, chronic: Secondary | ICD-10-CM

## 2020-10-06 DIAGNOSIS — F900 Attention-deficit hyperactivity disorder, predominantly inattentive type: Secondary | ICD-10-CM | POA: Diagnosis not present

## 2020-10-06 DIAGNOSIS — F515 Nightmare disorder: Secondary | ICD-10-CM | POA: Diagnosis not present

## 2020-10-06 DIAGNOSIS — F321 Major depressive disorder, single episode, moderate: Secondary | ICD-10-CM

## 2020-10-06 MED ORDER — DULOXETINE HCL 60 MG PO CPEP: 60.0000 mg | ORAL_CAPSULE | Freq: Two times a day (BID) | ORAL | 2 refills | Status: DC

## 2020-10-06 MED ORDER — AMPHETAMINE-DEXTROAMPHETAMINE 20 MG PO TABS
20.0000 mg | ORAL_TABLET | Freq: Two times a day (BID) | ORAL | 0 refills | Status: DC
Start: 2020-10-06 — End: 2020-12-16

## 2020-10-06 MED ORDER — AMPHETAMINE-DEXTROAMPHETAMINE 20 MG PO TABS: 20.0000 mg | ORAL_TABLET | Freq: Two times a day (BID) | ORAL | 0 refills | Status: DC

## 2020-10-06 MED ORDER — PRAZOSIN HCL 2 MG PO CAPS: 2.0000 mg | ORAL_CAPSULE | Freq: Every day | ORAL | 2 refills | Status: DC
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
## 2020-10-12 ENCOUNTER — Other Ambulatory Visit (INDEPENDENT_AMBULATORY_CARE_PROVIDER_SITE_OTHER): Payer: Self-pay | Admitting: Internal Medicine

## 2020-10-12 ENCOUNTER — Telehealth (INDEPENDENT_AMBULATORY_CARE_PROVIDER_SITE_OTHER): Payer: Self-pay

## 2020-10-12 MED ORDER — TRULICITY 0.75 MG/0.5ML ~~LOC~~ SOAJ: 0.7500 mg | SUBCUTANEOUS | 3 refills | Status: DC

## 2020-10-12 NOTE — Telephone Encounter (Signed)
Patient called back and asked for the regular dosage to be sent to her pharmacy please of the following medication:  Dulaglutide (TRULICITY) 3.83 JR/9.3PS SOPN  Last filled 08/04/2020, 44mL with 3 refills  Pharmacy needs an updated prescription.

## 2020-10-12 NOTE — Telephone Encounter (Signed)
Patient called and left a detailed voice message and stated that she thinks her Trulicity is too high of a dose because since she increased it she has been waking up in the middle of the night with severe shaking and sweats and stated that her blood sugar is too low. Patient also stated that she did not take the increased dose last night and she did not wake up with these symptoms and asked if she could go back to the regular dose? Please advise.

## 2020-10-12 NOTE — Telephone Encounter (Signed)
Okay, she can go back to the regular dose.

## 2020-10-12 NOTE — Telephone Encounter (Signed)
Called patient and LMOVM to return call  Left a detailed voice message to let patient know that she can go back to her regular dose per Dr. Anastasio Champion.

## 2020-10-12 NOTE — Telephone Encounter (Signed)
Okay, I have sent this to [facility] on [address].

## 2020-10-19 ENCOUNTER — Encounter (INDEPENDENT_AMBULATORY_CARE_PROVIDER_SITE_OTHER): Payer: Self-pay | Admitting: Internal Medicine

## 2020-10-19 ENCOUNTER — Other Ambulatory Visit: Payer: Self-pay

## 2020-10-19 ENCOUNTER — Ambulatory Visit (INDEPENDENT_AMBULATORY_CARE_PROVIDER_SITE_OTHER): Payer: Medicare Other | Admitting: Internal Medicine

## 2020-10-19 VITALS — BP 162/104 | HR 84 | Temp 98.8°F | Ht 60.0 in | Wt 189.3 lb

## 2020-10-19 DIAGNOSIS — K219 Gastro-esophageal reflux disease without esophagitis: Secondary | ICD-10-CM

## 2020-10-19 DIAGNOSIS — K754 Autoimmune hepatitis: Secondary | ICD-10-CM | POA: Diagnosis not present

## 2020-10-19 DIAGNOSIS — E1143 Type 2 diabetes mellitus with diabetic autonomic (poly)neuropathy: Secondary | ICD-10-CM

## 2020-10-19 DIAGNOSIS — K3184 Gastroparesis: Secondary | ICD-10-CM

## 2020-10-19 MED ORDER — AZATHIOPRINE 50 MG PO TABS: 50.0000 mg | ORAL_TABLET | Freq: Every day | ORAL | 5 refills | Status: DC

## 2020-10-19 NOTE — Patient Instructions (Addendum)
Dietary consultation to help you manage her diet both for gastroparesis and diabetes Take domperidone 10 mg by mouth 30 minutes before each meal and at bedtime.  Notify if you have any side effects. Please keep ibuprofen use to minimum.  You are already on full dose aspirin and taking ibuprofen along with that increases likelihood of peptic ulcer disease.

## 2020-10-19 NOTE — Progress Notes (Signed)
Presenting complaint;  Follow for gastroparesis. History of autoimmune hepatitis.  Database hand subjective:  Patient is 50 year old Caucasian female with multiple medical problems who also has autoimmune hepatitis and has remained in remission for several years on low-dose azathioprine who also has GERD and last year she was diagnosed with gastroparesis when she presented with recurrent spells of nausea and vomiting.  She had EGD at [facility] in October 2020.  We therefore proceeded with gastric emptying study and it was abnormal.  Patient was begun on domperidone.  Patient states she continues to have spells of nausea and vomiting.  She has at least 1 episode every 2 weeks.  She says when this occurs she was on liquid diet and feels better after 24 hours but it takes 3 days before she fully recovers.  During this spells she feels tired and sleeps too much.  She says heartburn is well controlled with PPI.  No history of hematemesis melena or rectal bleeding.  She is using ibuprofen once or twice daily.  She is watching her diet.  She is having hard time figuring out what to eat since she is diabetic.  She has lost 8 pounds in the last 6 months.  She has history of IBS with intermittent postprandial diarrhea but lately she is not having these spells often.  Dicyclomine helps.  She denies melena or rectal bleeding. She is taking domperidone 10 mg before breakfast and evening meal.  She is not having any side effects.  Current Medications: Outpatient Encounter Medications as of 10/19/2020  Medication Sig  . Accu-Chek Softclix Lancets lancets CHECK SUGAR ONCE DAILY  . albuterol (PROVENTIL) (2.5 MG/3ML) 0.083% nebulizer solution Inhale into the lungs as needed.  Marland Kitchen amphetamine-dextroamphetamine (ADDERALL) 20 MG tablet Take 1 tablet (20 mg total) by mouth 2 (two) times daily.  Marland Kitchen amphetamine-dextroamphetamine (ADDERALL) 20 MG tablet Take 1 tablet (20 mg total) by mouth 2 (two) times daily.  Marland Kitchen  amphetamine-dextroamphetamine (ADDERALL) 20 MG tablet Take 1 tablet (20 mg total) by mouth 2 (two) times daily.  . Ascorbic Acid (VITAMIN C) 1000 MG tablet Take 500 mg by mouth every morning.   Marland Kitchen aspirin 325 MG EC tablet Take 325 mg by mouth daily.  Marland Kitchen azaTHIOprine (IMURAN) 50 MG tablet TAKE 1 TABLET(50 MG) BY MOUTH DAILY  . bimatoprost (LUMIGAN) 0.01 % SOLN Place 1 drop into both eyes at bedtime.  . calcium-vitamin D (OSCAL WITH D) 500-200 MG-UNIT tablet Take 1 tablet by mouth 2 (two) times daily.  . cycloSPORINE (RESTASIS) 0.05 % ophthalmic emulsion Place 1 drop into both eyes daily.  Marland Kitchen dicyclomine (BENTYL) 10 MG capsule Take 1 capsule (10 mg total) by mouth 2 (two) times daily as needed.  . Dulaglutide (TRULICITY) 4.09 WJ/1.9JY SOPN Inject 0.75 mg into the skin once a week.  . DULoxetine (CYMBALTA) 60 MG capsule Take 1 capsule (60 mg total) by mouth 2 (two) times daily.  Marland Kitchen esomeprazole (NEXIUM) 40 MG capsule TAKE 1 CAPSULE BY MOUTH EVERY MORNING  . fexofenadine (ALLEGRA) 60 MG tablet Take 1 tablet by mouth 2 (two) times daily as needed for allergies.   . furosemide (LASIX) 40 MG tablet Take 40 mg by mouth every other day.  . gabapentin (NEURONTIN) 300 MG capsule Take 300 mg by mouth at bedtime.  Marland Kitchen glucose blood (ACCU-CHEK GUIDE) test strip 1 each by Other route in the morning and at bedtime. for testing  . hydrALAZINE (APRESOLINE) 100 MG tablet Take 100 mg by mouth 4 (four) times daily.   Marland Kitchen  ibuprofen (ADVIL) 800 MG tablet as needed.  Marland Kitchen lisinopril (ZESTRIL) 40 MG tablet TAKE 1 TABLET(40 MG) BY MOUTH DAILY  . meclizine (ANTIVERT) 12.5 MG tablet Take 1 tablet (12.5 mg total) by mouth 3 (three) times daily as needed for dizziness.  . metFORMIN (GLUCOPHAGE) 1000 MG tablet Take 1 tablet (1,000 mg total) by mouth 2 (two) times daily with a meal.  . metFORMIN (GLUCOPHAGE) 500 MG tablet TAKE 1 TABLET BY MOUTH EVERY DAY  . metoprolol succinate (TOPROL-XL) 100 MG 24 hr tablet TAKE 1 TABLET(100 MG) BY  MOUTH DAILY WITH FOOD (Patient taking differently: Take 150 mg by mouth daily.)  . mirabegron ER (MYRBETRIQ) 50 MG TB24 tablet Take 1 tablet (50 mg total) by mouth daily.  . Multiple Vitamins-Minerals (CENTRUM) tablet Take 1 tablet by mouth daily.  . nitroGLYCERIN (NITROSTAT) 0.4 MG SL tablet as needed.  . NP THYROID 60 MG tablet TAKE 1 TABLET(60 MG) BY MOUTH DAILY BEFORE BREAKFAST  . NP THYROID 90 MG tablet Take 1 tablet (90 mg total) by mouth daily.  . ondansetron (ZOFRAN) 4 MG tablet Take 1 tablet (4 mg total) by mouth every 8 (eight) hours as needed for nausea or vomiting.  . pramipexole (MIRAPEX) 1 MG tablet Take 1 mg by mouth daily.  . prazosin (MINIPRESS) 2 MG capsule Take 1 capsule (2 mg total) by mouth at bedtime.  Marland Kitchen PRESCRIPTION MEDICATION Take 1 tablet by mouth daily. Take 1 tablet by mouth daily before food and at bedtime.   This is MOTILLIUM ( DOMPERIDONE 10 mg) Tablet  . Probiotic Product (RESTORA PO) Take by mouth daily.   . rosuvastatin (CRESTOR) 40 MG tablet rosuvastatin 40 mg tablet   No facility-administered encounter medications on file as of 10/19/2020.     Objective: Blood pressure (!) 162/104, pulse 84, temperature 98.8 F (37.1 C), temperature source Oral, height 5' (1.524 m), weight 189 lb 4.8 oz (85.9 kg). Patient is alert and in no acute distress. She is wearing a mask. Conjunctiva is pink. Sclera is nonicteric Oropharyngeal mucosa is normal. No neck masses or thyromegaly noted. Cardiac exam with regular rhythm normal S1 and S2. No murmur or gallop noted. Lungs are clear to auscultation. Abdomen is full.  Bowel sounds are normal.  She does not have succussion splash or epigastric region.  On palpation abdomen is soft and nontender with organomegaly or masses. No LE edema or clubbing noted.  Labs/studies Results:  CBC Latest Ref Rng & Units 01/25/2020 10/20/2019 07/06/2019  WBC 4.0 - 10.5 K/uL 11.8(H) 9.0 12.7(H)  Hemoglobin 12.0 - 15.0 g/dL 14.5 12.7 12.8   Hematocrit 36.0 - 46.0 % 43.2 37.8 37.8  Platelets 150 - 400 K/uL 316 300 258    CMP Latest Ref Rng & Units 08/03/2020 01/25/2020 01/21/2020  Glucose 65 - 139 mg/dL 556(HH) 183(H) 172(H)  BUN 7 - 25 mg/dL 11 13 8   Creatinine 0.50 - 1.05 mg/dL 0.79 0.68 0.71  Sodium 135 - 146 mmol/L 132(L) 139 140  Potassium 3.5 - 5.3 mmol/L 5.0 4.0 4.6  Chloride 98 - 110 mmol/L 96(L) 103 101  CO2 20 - 32 mmol/L 23 25 28   Calcium 8.6 - 10.4 mg/dL 9.3 8.9 9.4  Total Protein 6.1 - 8.1 g/dL 6.7 8.2(H) 6.4  Total Bilirubin 0.2 - 1.2 mg/dL 0.4 0.8 0.2  Alkaline Phos 38 - 126 U/L - 107 108  AST 10 - 35 U/L 19 24 20   ALT 6 - 29 U/L 21 25 22     Hepatic  Function Latest Ref Rng & Units 08/03/2020 01/25/2020 01/21/2020  Total Protein 6.1 - 8.1 g/dL 6.7 8.2(H) 6.4  Albumin 3.5 - 5.0 g/dL - 4.4 4.0  AST 10 - 35 U/L 19 24 20   ALT 6 - 29 U/L 21 25 22   Alk Phosphatase 38 - 126 U/L - 107 108  Total Bilirubin 0.2 - 1.2 mg/dL 0.4 0.8 0.2  Bilirubin, Direct <=0.2 mg/dL - - -     Assessment:  #1.  Autoimmune hepatitis.  She remains in remission on low-dose azathioprine.  #2.  Diabetic gastroparesis.  She is not doing well with low-dose domperidone and dietary measures.  Given that she is diabetic it would be a good idea for her to see dietitian so that she could be better educated on food selection.  #3.  Chronic GERD.  She is doing well with PPI.  #4.  History of IBS/diarrhea.  She is having spells less often and dicyclomine is working.   Plan:  Increase domperidone to 10 mg by mouth 30 minutes before breakfast and at bedtime. Patient advised to keep ibuprofen use to minimum.  She is on full dose aspirin and therefore at risk for peptic ulcer disease and GI bleed. Dietary consultation to help patient with diabetic and gastroparesis diet. New prescription for azathioprine 50 mg daily sent to patient's pharmacy for 30 doses with 5 refills. Office visit in 3 months.

## 2020-10-29 ENCOUNTER — Telehealth (INDEPENDENT_AMBULATORY_CARE_PROVIDER_SITE_OTHER): Payer: Self-pay | Admitting: Internal Medicine

## 2020-10-29 IMAGING — CR DG CHEST 1V PORT
1 series · 1 of 1 positions shown · non-contrast
Comparison: 08/10/2018

CLINICAL DATA: Chest pain

EXAM:
PORTABLE CHEST 1 VIEW

[portable]
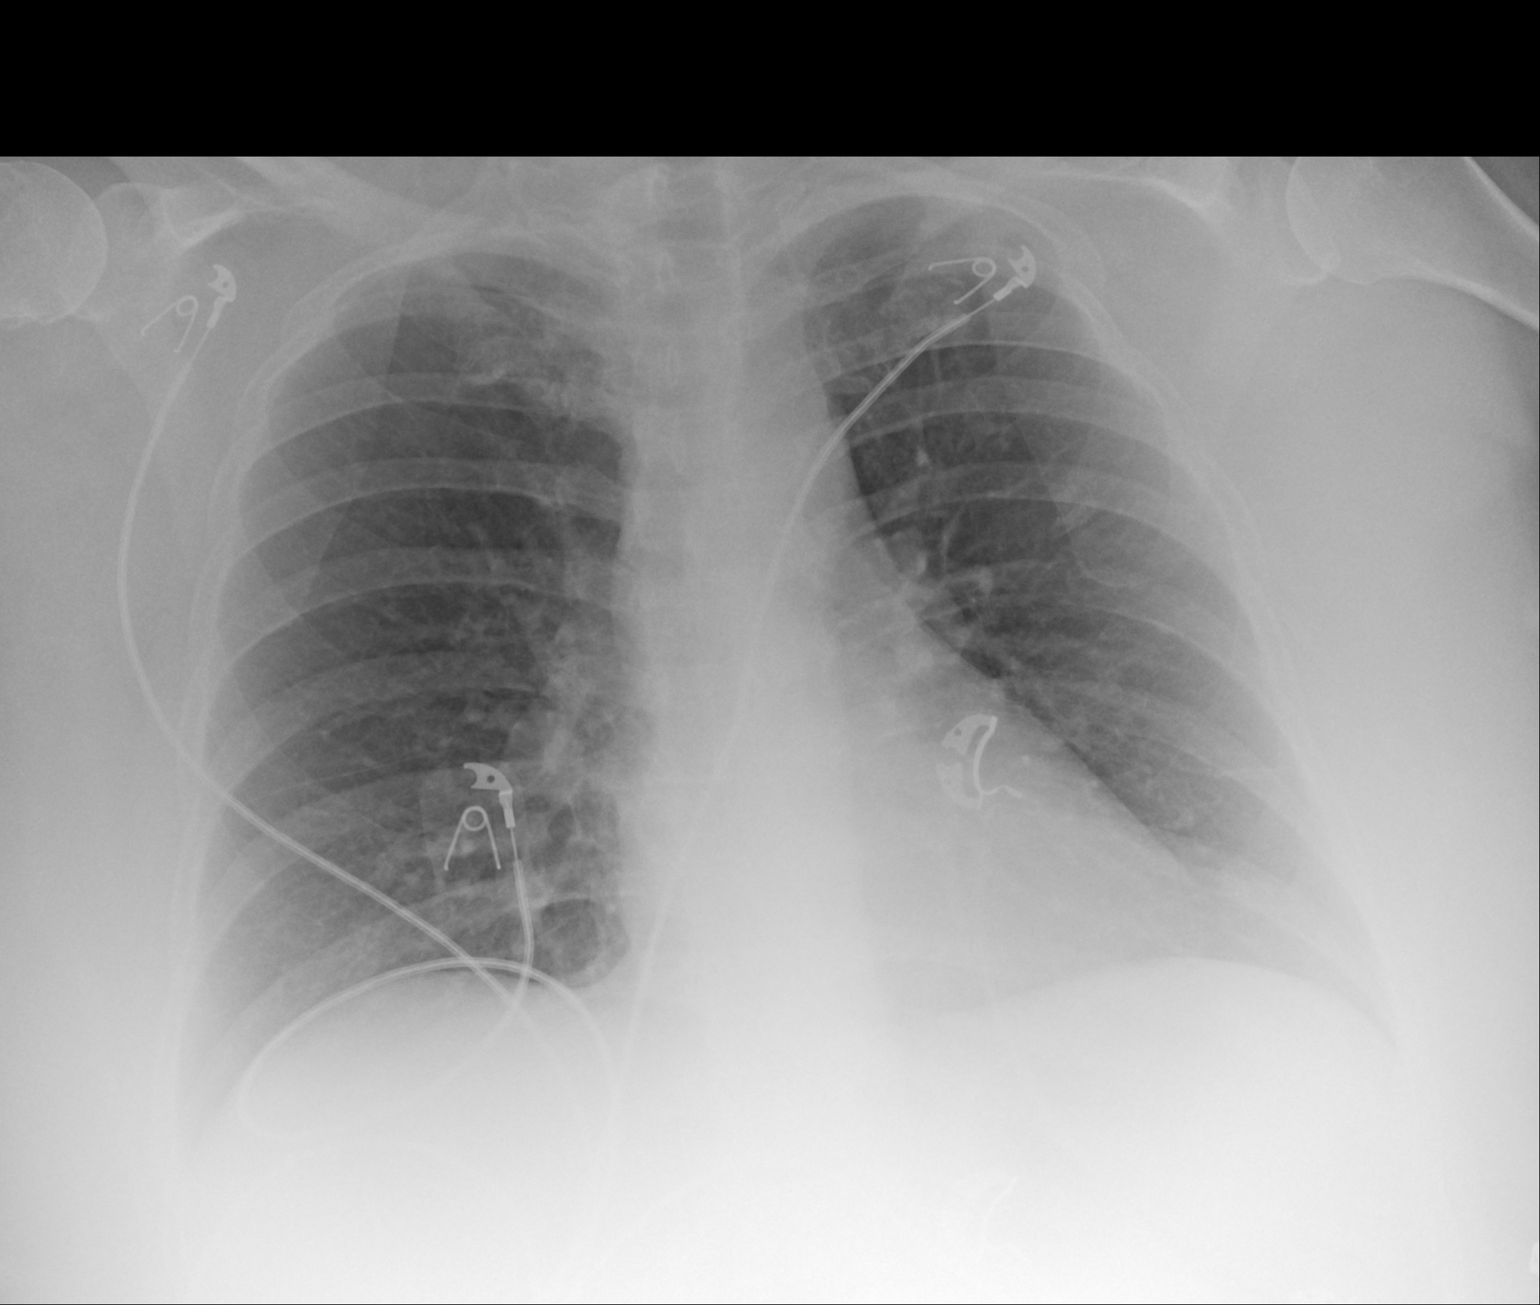

[1 of 1 positions shown; findings below may reference images not displayed]

FINDINGS: The heart size and mediastinal contours are within normal limits.
Both lungs are clear. The visualized skeletal structures are
unremarkable.
IMPRESSION: No acute abnormality of the lungs in AP portable projection.

## 2020-10-29 NOTE — Telephone Encounter (Signed)
Left message for patient to call back and schedule Medicare Annual Wellness Visit (AWV) either virtually or in office.  I wanted to see if patient could do a virtual appointment 11/04/20 with shannon AWV nurse  I left my jabber number for patient to call back  awvs 03/10/12 per [facility]  please schedule at anytime with [facility]  health coach  This should be a 45  minute visit.

## 2020-11-10 ENCOUNTER — Ambulatory Visit (INDEPENDENT_AMBULATORY_CARE_PROVIDER_SITE_OTHER): Payer: Medicare Other | Admitting: Physician Assistant

## 2020-11-10 ENCOUNTER — Encounter: Payer: Self-pay | Admitting: Physician Assistant

## 2020-11-10 ENCOUNTER — Other Ambulatory Visit: Payer: Self-pay

## 2020-11-10 ENCOUNTER — Telehealth (INDEPENDENT_AMBULATORY_CARE_PROVIDER_SITE_OTHER): Payer: Self-pay | Admitting: Internal Medicine

## 2020-11-10 DIAGNOSIS — Z808 Family history of malignant neoplasm of other organs or systems: Secondary | ICD-10-CM

## 2020-11-10 DIAGNOSIS — L821 Other seborrheic keratosis: Secondary | ICD-10-CM

## 2020-11-10 DIAGNOSIS — L304 Erythema intertrigo: Secondary | ICD-10-CM

## 2020-11-10 DIAGNOSIS — D1800 Hemangioma unspecified site: Secondary | ICD-10-CM

## 2020-11-10 DIAGNOSIS — L814 Other melanin hyperpigmentation: Secondary | ICD-10-CM | POA: Diagnosis not present

## 2020-11-10 DIAGNOSIS — Z1283 Encounter for screening for malignant neoplasm of skin: Secondary | ICD-10-CM | POA: Diagnosis not present

## 2020-11-10 DIAGNOSIS — D235 Other benign neoplasm of skin of trunk: Secondary | ICD-10-CM

## 2020-11-10 DIAGNOSIS — L578 Other skin changes due to chronic exposure to nonionizing radiation: Secondary | ICD-10-CM

## 2020-11-10 DIAGNOSIS — D485 Neoplasm of uncertain behavior of skin: Secondary | ICD-10-CM

## 2020-11-10 MED ORDER — ALCLOMETASONE DIPROPIONATE 0.05 % EX CREA: TOPICAL_CREAM | Freq: Every evening | CUTANEOUS | 4 refills | Status: DC

## 2020-11-10 MED ORDER — KETOCONAZOLE 2 % EX CREA: 1.0000 "application " | TOPICAL_CREAM | Freq: Every evening | CUTANEOUS | 4 refills | Status: AC

## 2020-11-10 NOTE — Patient Instructions (Signed)

## 2020-11-10 NOTE — Progress Notes (Signed)
   New Patient   Subjective  Lindsay Walsh is a 50 y.o. female who presents for the following: Annual Exam (New lesion right inner thigh x years- it seems to be getting darker and itches.  Also has a cyst on back of neck x months- and seems to be growing. Patient denies any personal history of melanoma or non mole skin cancer.  Patient does have a family history of non mole skin cancer.  ).  Rash under breasts. Gets pink and scaly. Worse in the summer when it gets hot. She is in menopause and stays hot. They get very painful and raw.Was using triamcinalone which was giving her stretch marks under her breasts.   The following portions of the chart were reviewed this encounter and updated as appropriate:  Tobacco  Allergies  Meds  Problems  Med Hx  Surg Hx  Fam Hx      Objective  Well appearing patient in no apparent distress; mood and affect are within normal limits.  A full examination was performed including scalp, head, eyes, ears, nose, lips, neck, chest, axillae, abdomen, back, buttocks, bilateral upper extremities, bilateral lower extremities, hands, feet, fingers, toes, fingernails, and toenails. All findings within normal limits unless otherwise noted below.  Objective  Left Inframammary Fold, Right Inframammary Fold: Erythema and stretch marks  Objective  Left Upper Back: Dark nodule      Assessment & Plan  Intertrigo (2) Left Inframammary Fold; Right Inframammary Fold  ketoconazole (NIZORAL) 2 % cream - Left Inframammary Fold, Right Inframammary Fold  alclomethasone (ACLOVATE) 0.05 % cream - Left Inframammary Fold, Right Inframammary Fold  Neoplasm of uncertain behavior of skin Left Upper Back  Skin / nail biopsy Type of biopsy: punch   Informed consent: discussed and consent obtained   Timeout: patient name, date of birth, surgical site, and procedure verified   Procedure prep:  Patient was prepped and draped in usual sterile fashion (Non sterile) Prep  type:  Chlorhexidine Anesthesia: the lesion was anesthetized in a standard fashion   Anesthetic:  1% lidocaine w/ epinephrine 1-100,000 local infiltration Punch size:  8 mm Suture size:  3-0 Suture type: nylon   Suture removal (days):  14 Hemostasis achieved with: suture   Outcome: patient tolerated procedure well   Post-procedure details: sterile dressing applied and wound care instructions given   Dressing type: petrolatum and pressure dressing    Specimen 1 - Surgical pathology Differential Diagnosis: R/O DF Punch Check Margins: No  Lentigines - Scattered tan macules - Discussed due to sun exposure - Benign, observe - Call for any changes  Seborrheic Keratoses - Stuck-on, waxy, tan-brown papules and plaques  - Discussed benign etiology and prognosis. - Observe - Call for any changes  Hemangiomas - Red papules - Discussed benign nature - Observe - Call for any changes  Actinic Damage - diffuse scaly erythematous macules with underlying dyspigmentation - Recommend daily broad spectrum sunscreen SPF 30+ to sun-exposed areas, reapply every 2 hours as needed.  - Call for new or changing lesions.  Skin cancer screening performed today.   I, Argus Caraher, PA-C, have reviewed all documentation for this visit. The documentation on 11/10/20 for the exam, diagnosis, procedures, and orders are all accurate and complete.

## 2020-11-10 NOTE — Telephone Encounter (Signed)
Left message for patient to call back and schedule Medicare Annual Wellness Visit (AWV) either virtually or in office.    awvs 03/10/12 per [facility]  please schedule at anytime with [facility] health coach  This should be a 45 minute visit.

## 2020-11-17 ENCOUNTER — Ambulatory Visit (INDEPENDENT_AMBULATORY_CARE_PROVIDER_SITE_OTHER): Payer: Medicare Other | Admitting: Internal Medicine

## 2020-11-22 ENCOUNTER — Other Ambulatory Visit: Payer: Self-pay

## 2020-11-22 DIAGNOSIS — N3281 Overactive bladder: Secondary | ICD-10-CM

## 2020-11-22 MED ORDER — MIRABEGRON ER 50 MG PO TB24: 50.0000 mg | ORAL_TABLET | Freq: Every day | ORAL | 11 refills | Status: DC

## 2020-11-24 ENCOUNTER — Ambulatory Visit (INDEPENDENT_AMBULATORY_CARE_PROVIDER_SITE_OTHER): Payer: Medicare Other | Admitting: *Deleted

## 2020-11-24 ENCOUNTER — Other Ambulatory Visit: Payer: Self-pay

## 2020-11-24 DIAGNOSIS — Z4802 Encounter for removal of sutures: Secondary | ICD-10-CM

## 2020-11-24 NOTE — Progress Notes (Signed)
Here for NTS suture removal. No signs or symptoms of infection. Path to patient. 

## 2020-12-02 ENCOUNTER — Other Ambulatory Visit (INDEPENDENT_AMBULATORY_CARE_PROVIDER_SITE_OTHER): Payer: Self-pay | Admitting: Internal Medicine

## 2020-12-16 ENCOUNTER — Other Ambulatory Visit: Payer: Self-pay

## 2020-12-16 ENCOUNTER — Encounter (HOSPITAL_COMMUNITY): Payer: Self-pay | Admitting: Psychiatry

## 2020-12-16 ENCOUNTER — Telehealth (INDEPENDENT_AMBULATORY_CARE_PROVIDER_SITE_OTHER): Payer: Medicare Other | Admitting: Psychiatry

## 2020-12-16 DIAGNOSIS — F515 Nightmare disorder: Secondary | ICD-10-CM | POA: Diagnosis not present

## 2020-12-16 DIAGNOSIS — F4312 Post-traumatic stress disorder, chronic: Secondary | ICD-10-CM

## 2020-12-16 DIAGNOSIS — F321 Major depressive disorder, single episode, moderate: Secondary | ICD-10-CM

## 2020-12-16 DIAGNOSIS — F900 Attention-deficit hyperactivity disorder, predominantly inattentive type: Secondary | ICD-10-CM

## 2020-12-16 MED ORDER — AMPHETAMINE-DEXTROAMPHETAMINE 20 MG PO TABS: 20.0000 mg | ORAL_TABLET | Freq: Two times a day (BID) | ORAL | 0 refills | Status: DC

## 2020-12-16 MED ORDER — DULOXETINE HCL 60 MG PO CPEP: 60.0000 mg | ORAL_CAPSULE | Freq: Two times a day (BID) | ORAL | 2 refills | Status: DC

## 2020-12-16 MED ORDER — PRAZOSIN HCL 2 MG PO CAPS: 2.0000 mg | ORAL_CAPSULE | Freq: Every day | ORAL | 2 refills | Status: DC
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
## 2020-12-21 DIAGNOSIS — G4730 Sleep apnea, unspecified: Secondary | ICD-10-CM | POA: Diagnosis not present

## 2020-12-21 DIAGNOSIS — I10 Essential (primary) hypertension: Secondary | ICD-10-CM | POA: Diagnosis not present

## 2020-12-21 DIAGNOSIS — R079 Chest pain, unspecified: Secondary | ICD-10-CM | POA: Diagnosis not present

## 2020-12-30 ENCOUNTER — Other Ambulatory Visit: Payer: Self-pay

## 2020-12-30 ENCOUNTER — Ambulatory Visit (INDEPENDENT_AMBULATORY_CARE_PROVIDER_SITE_OTHER): Payer: Medicare Other | Admitting: Clinical

## 2020-12-30 DIAGNOSIS — F900 Attention-deficit hyperactivity disorder, predominantly inattentive type: Secondary | ICD-10-CM

## 2020-12-30 DIAGNOSIS — F4310 Post-traumatic stress disorder, unspecified: Secondary | ICD-10-CM

## 2020-12-30 DIAGNOSIS — F321 Major depressive disorder, single episode, moderate: Secondary | ICD-10-CM | POA: Diagnosis not present
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
## 2021-01-05 DIAGNOSIS — G4730 Sleep apnea, unspecified: Secondary | ICD-10-CM | POA: Diagnosis not present

## 2021-01-24 ENCOUNTER — Telehealth: Payer: Self-pay | Admitting: Nutrition

## 2021-01-24 ENCOUNTER — Ambulatory Visit: Payer: Medicare Other | Admitting: Nutrition

## 2021-01-24 NOTE — Telephone Encounter (Signed)
No VM to leave message to call back and reschedule missed appt.

## 2021-01-26 ENCOUNTER — Telehealth (HOSPITAL_COMMUNITY): Payer: Self-pay | Admitting: Clinical

## 2021-01-26 ENCOUNTER — Ambulatory Visit (HOSPITAL_COMMUNITY): Payer: Medicare Other | Admitting: Clinical

## 2021-01-26 ENCOUNTER — Other Ambulatory Visit: Payer: Self-pay

## 2021-01-26 NOTE — Telephone Encounter (Signed)
The patient did not respond to video link or call and VM box is full

## 2021-02-08 ENCOUNTER — Ambulatory Visit (INDEPENDENT_AMBULATORY_CARE_PROVIDER_SITE_OTHER): Payer: Medicare Other | Admitting: Internal Medicine

## 2021-02-15 ENCOUNTER — Encounter: Payer: Self-pay | Admitting: Physician Assistant

## 2021-02-15 ENCOUNTER — Telehealth: Payer: Medicare Other | Admitting: Physician Assistant

## 2021-02-15 ENCOUNTER — Telehealth (INDEPENDENT_AMBULATORY_CARE_PROVIDER_SITE_OTHER): Payer: Self-pay

## 2021-02-15 DIAGNOSIS — U071 COVID-19: Secondary | ICD-10-CM

## 2021-02-15 MED ORDER — BENZONATATE 100 MG PO CAPS: 100.0000 mg | ORAL_CAPSULE | Freq: Three times a day (TID) | ORAL | 0 refills | Status: AC

## 2021-02-15 MED ORDER — ALBUTEROL SULFATE HFA 108 (90 BASE) MCG/ACT IN AERS: 2.0000 | INHALATION_SPRAY | Freq: Four times a day (QID) | RESPIRATORY_TRACT | 0 refills | Status: DC | PRN

## 2021-02-15 MED ORDER — GUAIFENESIN ER 600 MG PO TB12: 600.0000 mg | ORAL_TABLET | Freq: Two times a day (BID) | ORAL | 0 refills | Status: DC

## 2021-02-15 NOTE — Telephone Encounter (Signed)
Patient called and left a detailed voice message to let us know that she is Covid Positive.  I called the patient back and left a detailed voice message on her voicemail and let her know that due to not having a provider in the office she could schedule a MyChart visit or go to [facility]. I also advised for patient to go on her MyChart to find a new provider or call our [place] to see if they can assist in helping her find a new provider.

## 2021-02-15 NOTE — Progress Notes (Signed)
[name], Lindsay Walsh are scheduled for a virtual visit with your provider today.    Just as we do with appointments in the office, we must obtain your consent to participate.  Your consent will be active for this visit and any virtual visit you may have with one of our providers in the next 365 days.    If you have a MyChart account, I can also send a copy of this consent to you electronically.  All virtual visits are billed to your insurance company just like a traditional visit in the office.  As this is a virtual visit, video technology does not allow for your provider to perform a traditional examination.  This may limit your provider's ability to fully assess your condition.  If your provider identifies any concerns that need to be evaluated in person or the need to arrange testing such as labs, EKG, etc, we will make arrangements to do so.    Although advances in technology are sophisticated, we cannot ensure that it will always work on either your end or our end.  If the connection with a video visit is poor, we may have to switch to a telephone visit.  With either a video or telephone visit, we are not always able to ensure that we have a secure connection.   I need to obtain your verbal consent now.   Are you willing to proceed with your visit today?   NAYOMI BARUCH has provided verbal consent on 02/15/2021 for a virtual visit (video or telephone).   Rodney Booze, PA-C 02/15/2021  2:43 PM Ms. Ging,you are scheduled for a virtual visit with your provider today.    Just as we do with appointments in the office, we must obtain your consent to participate.  Your consent will be active for this visit and any virtual visit you may have with one of our providers in the next 365 days.    If you have a MyChart account, I can also send a copy of this consent to you electronically.  All virtual visits are billed to your insurance company just like a traditional visit in the office.  As this is a  virtual visit, video technology does not allow for your provider to perform a traditional examination.  This may limit your provider's ability to fully assess your condition.  If your provider identifies any concerns that need to be evaluated in person or the need to arrange testing such as labs, EKG, etc, we will make arrangements to do so.    Although advances in technology are sophisticated, we cannot ensure that it will always work on either your end or our end.  If the connection with a video visit is poor, we may have to switch to a telephone visit.  With either a video or telephone visit, we are not always able to ensure that we have a secure connection.   I need to obtain your verbal consent now.   Are you willing to proceed with your visit today?   ARMANDA VIBERT has provided verbal consent on 02/15/2021 for a virtual visit (video or telephone).   Rodney Booze, [place] 02/15/2021  2:43 PM   Date:  02/15/2021   ID:  Lindsay Walsh, DOB Aug 24, 1970, MRN [id number]  Patient Location: Home Provider Location: Home Office   Participants: Patient and Provider for Visit and Wrap up  Method of visit: Video  Location of Patient: Home Location of Provider: Home Office Consent was obtain  for visit over the video. Services rendered by provider: Visit was performed via video  A video enabled telemedicine application was used and I verified that I am speaking with the correct person using two identifiers.  PCP:  Doree Albee, MD (Inactive)   Chief Complaint:  COVID  History of Present Illness:    NASHAE GAIER is a 50 y.o. female with history as stated below. Presents video telehealth for an acute care visit  Pt tested positive for covid this week. Her symptoms started 6 days ago. Pt is c/o fatigue, generalized weakness, head congestion, rhinorrhea, sore throat, cough, sneezing and chills. Denies chest pain.  Past Medical, Surgical, Social History, Allergies, and Medications  have been Reviewed.  Past Medical History:  Diagnosis Date   Acid reflux    Allergic rhinitis due to pollen    Angina    took SL nitro one week ago   Anxiety    Arthritis    back   Arthritis    Autoimmune hepatitis ([place])    Back pain    Chest pain    Normal stress echo in 2011; PVCs; pedal edema   CVA (cerebral infarction)    Depression    Depression    Diabetes mellitus without complication (HCC)    Dizziness and giddiness    Dyspareunia 05/06/2014   Dysrhythmia    palpatations   Essential (primary) hypertension    Fasting hyperglycemia    Fibromyalgia    Fibromyalgia    Gastroesophageal reflux disease    Chronic abdominal pain; gastroparesis; globus hystericus; irritable bowel syndrome   Glaucoma    Hereditary and idiopathic neuropathy, unspecified    Hyperlipidemia    Hypertension    Impaired glucose tolerance    Irritable bowel syndrome    Major depressive disorder, single episode, unspecified    Morbid (severe) obesity due to excess calories (HCC)    Narcotic dependence (HCC)    Nausea    Nonspecific mesenteric lymphadenitis    Overweight(278.02)    Pain in limb    Pain in right foot    PTSD (post-traumatic stress disorder)    Pulmonary nodule    Restless legs syndrome    Shortness of breath    with exertion   Sleep apnea    Sleep apnea    on CPAP machine   Sleep apnea, unspecified    Stroke ([place]) 2016   Tobacco abuse    one pack per day; 35 pack years   Type 2 diabetes mellitus with hyperglycemia (HCC)     Current Meds  Medication Sig   albuterol (VENTOLIN HFA) 108 (90 Base) MCG/ACT inhaler Inhale 2 puffs into the lungs every 6 (six) hours as needed for wheezing or shortness of breath.   benzonatate (TESSALON) 100 MG capsule Take 1 capsule (100 mg total) by mouth every 8 (eight) hours for 5 days.   guaiFENesin (MUCINEX) 600 MG 12 hr tablet Take 1 tablet (600 mg total) by mouth 2 (two) times daily.     Allergies:   Doxycycline, Fentanyl,  Amlodipine, Bupropion, Cefoxitin, Ceftin [cefuroxime axetil], Iodinated diagnostic agents, Iohexol, Norvasc [amlodipine besylate], Wellbutrin [bupropion hcl], Latex, and Tape   ROS See HPI for history of present illness.  Physical Exam HENT:     Head: Normocephalic.  Pulmonary:     Effort: Pulmonary effort is normal.     Comments: No tachypnea. Speaking in full sentences. Neurological:     Mental Status: She is alert.  MDM: Pt c/o COVID. She is on day 6 of sxs so does not qualify for paxlovid or molnupirovir. Rx for mucinex, tessalon and albuterol given.   There are no diagnoses linked to this encounter.   Time:   Today, I have spent 10 minutes with the patient with telehealth technology discussing the above problems, reviewing the chart, previous notes, medications and orders.    Tests Ordered: No orders of the defined types were placed in this encounter.   Medication Changes: Meds ordered this encounter  Medications   benzonatate (TESSALON) 100 MG capsule    Sig: Take 1 capsule (100 mg total) by mouth every 8 (eight) hours for 5 days.    Dispense:  15 capsule    Refill:  0    Order Specific Question:   Supervising Provider    Answer:   MILLER, BRIAN [[id number]]   albuterol (VENTOLIN HFA) 108 (90 Base) MCG/ACT inhaler    Sig: Inhale 2 puffs into the lungs every 6 (six) hours as needed for wheezing or shortness of breath.    Dispense:  8 g    Refill:  0    Order Specific Question:   Supervising Provider    Answer:   MILLER, BRIAN [[id number]]   guaiFENesin (MUCINEX) 600 MG 12 hr tablet    Sig: Take 1 tablet (600 mg total) by mouth 2 (two) times daily.    Dispense:  6 tablet    Refill:  0    Order Specific Question:   Supervising Provider    Answer:   Noemi Chapel [[id number]]      Disposition:  Follow up  Signed, Rodney Booze, PA-C  02/15/2021 2:43 PM

## 2021-02-15 NOTE — Patient Instructions (Signed)
Lindsay Walsh, thank you for joining Lindsay Booze, PA-C for today's virtual visit.  While this provider is not your primary care provider (PCP), if your PCP is located in our provider database this encounter information will be shared with them immediately following your visit.  Consent: (Patient) Lindsay Walsh provided verbal consent for this virtual visit at the beginning of the encounter.  Current Medications:  Current Outpatient Medications:    Accu-Chek Softclix Lancets lancets, CHECK SUGAR ONCE DAILY, Disp: , Rfl:    albuterol (PROVENTIL) (2.5 MG/3ML) 0.083% nebulizer solution, Inhale into the lungs as needed., Disp: , Rfl:    alclomethasone (ACLOVATE) 0.05 % cream, Apply topically at bedtime., Disp: 60 g, Rfl: 4   amphetamine-dextroamphetamine (ADDERALL) 20 MG tablet, Take 1 tablet (20 mg total) by mouth 2 (two) times daily., Disp: 60 tablet, Rfl: 0   amphetamine-dextroamphetamine (ADDERALL) 20 MG tablet, Take 1 tablet (20 mg total) by mouth 2 (two) times daily., Disp: 60 tablet, Rfl: 0   amphetamine-dextroamphetamine (ADDERALL) 20 MG tablet, Take 1 tablet (20 mg total) by mouth 2 (two) times daily., Disp: 90 tablet, Rfl: 0   Ascorbic Acid (VITAMIN C) 1000 MG tablet, Take 500 mg by mouth every morning. , Disp: , Rfl:    aspirin 325 MG EC tablet, Take 325 mg by mouth daily., Disp: , Rfl:    azaTHIOprine (IMURAN) 50 MG tablet, Take 1 tablet (50 mg total) by mouth daily., Disp: 30 tablet, Rfl: 5   bimatoprost (LUMIGAN) 0.01 % SOLN, Place 1 drop into both eyes at bedtime., Disp: , Rfl:    calcium-vitamin D (OSCAL WITH D) 500-200 MG-UNIT tablet, Take 1 tablet by mouth 2 (two) times daily., Disp: , Rfl:    cycloSPORINE (RESTASIS) 0.05 % ophthalmic emulsion, Place 1 drop into both eyes daily., Disp: , Rfl:    dicyclomine (BENTYL) 10 MG capsule, Take 1 capsule (10 mg total) by mouth 2 (two) times daily as needed., Disp: 60 capsule, Rfl: 1   Dulaglutide (TRULICITY) [id number] [id number] SOPN,  Inject 0.75 mg into the skin once a week., Disp: 2 mL, Rfl: 3   DULoxetine (CYMBALTA) 60 MG capsule, Take 1 capsule (60 mg total) by mouth 2 (two) times daily., Disp: 60 capsule, Rfl: 2   esomeprazole (NEXIUM) 40 MG capsule, TAKE 1 CAPSULE BY MOUTH EVERY MORNING, Disp: 90 capsule, Rfl: 3   fexofenadine (ALLEGRA) 60 MG tablet, Take 1 tablet by mouth 2 (two) times daily as needed for allergies. , Disp: , Rfl:    furosemide (LASIX) 40 MG tablet, Take 40 mg by mouth every other day., Disp: , Rfl:    gabapentin (NEURONTIN) 300 MG capsule, Take 300 mg by mouth at bedtime., Disp: , Rfl:    hydrALAZINE (APRESOLINE) 100 MG tablet, Take 100 mg by mouth 4 (four) times daily. , Disp: , Rfl:    ibuprofen (ADVIL) 800 MG tablet, as needed., Disp: , Rfl:    lisinopril (ZESTRIL) 40 MG tablet, TAKE 1 TABLET(40 MG) BY MOUTH DAILY, Disp: 90 tablet, Rfl: 0   meclizine (ANTIVERT) 12.5 MG tablet, Take 1 tablet (12.5 mg total) by mouth 3 (three) times daily as needed for dizziness., Disp: 30 tablet, Rfl: 0   metFORMIN (GLUCOPHAGE) 1000 MG tablet, Take 1 tablet (1,000 mg total) by mouth 2 (two) times daily with a meal., Disp: 60 tablet, Rfl: 3   metFORMIN (GLUCOPHAGE) 500 MG tablet, TAKE 1 TABLET BY MOUTH DAILY, Disp: 120 tablet, Rfl: 0   metoprolol succinate (TOPROL-XL) 100  MG 24 hr tablet, TAKE 1 TABLET(100 MG) BY MOUTH DAILY WITH FOOD (Patient taking differently: Take 150 mg by mouth daily.), Disp: 90 tablet, Rfl: 3   mirabegron ER (MYRBETRIQ) 50 MG TB24 tablet, Take 1 tablet (50 mg total) by mouth daily., Disp: 30 tablet, Rfl: 11   Multiple Vitamins-Minerals (CENTRUM) tablet, Take 1 tablet by mouth daily., Disp: , Rfl:    nitroGLYCERIN (NITROSTAT) 0.4 MG SL tablet, as needed., Disp: , Rfl:    NP THYROID 60 MG tablet, TAKE 1 TABLET(60 MG) BY MOUTH DAILY BEFORE BREAKFAST, Disp: 30 tablet, Rfl: 3   NP THYROID 90 MG tablet, Take 1 tablet (90 mg total) by mouth daily., Disp: 30 tablet, Rfl: 3   ondansetron (ZOFRAN) 4 MG  tablet, Take 1 tablet (4 mg total) by mouth every 8 (eight) hours as needed for nausea or vomiting., Disp: 4 tablet, Rfl: 0   pramipexole (MIRAPEX) 1 MG tablet, Take 1 mg by mouth daily., Disp: , Rfl:    prazosin (MINIPRESS) 2 MG capsule, Take 1 capsule (2 mg total) by mouth at bedtime., Disp: 30 capsule, Rfl: 2   PRESCRIPTION MEDICATION, Take 1 tablet by mouth daily. Take 1 tablet by mouth daily before food and at bedtime.   This is MOTILLIUM ( DOMPERIDONE 10 mg) Tablet, Disp: , Rfl:    Probiotic Product (RESTORA PO), Take by mouth daily. , Disp: , Rfl:    rosuvastatin (CRESTOR) 40 MG tablet, rosuvastatin 40 mg tablet, Disp: , Rfl:    Medications ordered in this encounter:  No orders of the defined types were placed in this encounter.    *If you need refills on other medications prior to your next appointment, please contact your pharmacy*  Follow-Up: Call back or seek an in-person evaluation if the symptoms worsen or if the condition fails to improve as anticipated.  Other Instructions Take tessalon, mucinex and albuterol as directed    If you have been instructed to have an in-person evaluation today at a local Urgent Care facility, please use the link below. It will take you to a list of all of our available [facility], including address, phone number and hours of operation. Please do not delay care.  [facility]  If you or a family member do not have a primary care provider, use the link below to schedule a visit and establish care. When you choose a [facility] primary care physician or advanced practice provider, you gain a long-term partner in health. Find a Primary Care Provider  Learn more about 's in-office and virtual care options: [facility]

## 2021-02-18 ENCOUNTER — Ambulatory Visit: Payer: Medicare Other | Admitting: Urology

## 2021-02-28 ENCOUNTER — Other Ambulatory Visit (INDEPENDENT_AMBULATORY_CARE_PROVIDER_SITE_OTHER): Payer: Self-pay | Admitting: *Deleted

## 2021-02-28 ENCOUNTER — Telehealth (INDEPENDENT_AMBULATORY_CARE_PROVIDER_SITE_OTHER): Payer: Self-pay

## 2021-02-28 DIAGNOSIS — E1159 Type 2 diabetes mellitus with other circulatory complications: Secondary | ICD-10-CM

## 2021-02-28 MED ORDER — ESOMEPRAZOLE MAGNESIUM 40 MG PO CPDR: 40.0000 mg | DELAYED_RELEASE_CAPSULE | Freq: Every morning | ORAL | 0 refills | Status: DC

## 2021-03-01 NOTE — Telephone Encounter (Signed)
Called patient and left a detailed voice message for her to call us back to give [place] the dose she is giving herself of her Trulicity.

## 2021-03-01 NOTE — Telephone Encounter (Signed)
Please call this patient and verify the dose of trulicity she is on. I received a medication refill request but the dose in her chart versus her last note are different. Per last office visit she should be on 1.'5mg'$  injection per week.

## 2021-03-02 MED ORDER — TRULICITY 0.75 MG/0.5ML ~~LOC~~ SOAJ: 0.7500 mg | SUBCUTANEOUS | 3 refills | Status: DC

## 2021-03-02 NOTE — Telephone Encounter (Signed)
Okay I will order the Trulicity 0.'75mg'$ /injection. Refill sent to [facility] on [address]

## 2021-03-02 NOTE — Telephone Encounter (Signed)
Patient called back and left a voice message that she is taking [id number] mg of the Trulicity.

## 2021-03-02 NOTE — Addendum Note (Signed)
Addended by: Jeralyn Ruths E on: 03/02/2021 11:58 AM   Modules accepted: Orders

## 2021-03-07 ENCOUNTER — Other Ambulatory Visit: Payer: Self-pay

## 2021-03-07 ENCOUNTER — Ambulatory Visit (INDEPENDENT_AMBULATORY_CARE_PROVIDER_SITE_OTHER): Payer: Medicare Other | Admitting: Clinical

## 2021-03-07 DIAGNOSIS — F321 Major depressive disorder, single episode, moderate: Secondary | ICD-10-CM

## 2021-03-07 DIAGNOSIS — F900 Attention-deficit hyperactivity disorder, predominantly inattentive type: Secondary | ICD-10-CM

## 2021-03-07 DIAGNOSIS — F4310 Post-traumatic stress disorder, unspecified: Secondary | ICD-10-CM | POA: Diagnosis not present
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
## 2021-03-28 ENCOUNTER — Ambulatory Visit (INDEPENDENT_AMBULATORY_CARE_PROVIDER_SITE_OTHER): Payer: Medicare Other | Admitting: Clinical

## 2021-03-28 ENCOUNTER — Other Ambulatory Visit: Payer: Self-pay

## 2021-03-28 DIAGNOSIS — F321 Major depressive disorder, single episode, moderate: Secondary | ICD-10-CM | POA: Diagnosis not present

## 2021-03-28 DIAGNOSIS — F900 Attention-deficit hyperactivity disorder, predominantly inattentive type: Secondary | ICD-10-CM

## 2021-03-28 DIAGNOSIS — F4310 Post-traumatic stress disorder, unspecified: Secondary | ICD-10-CM | POA: Diagnosis not present

## 2021-03-28 NOTE — Progress Notes (Signed)
Virtual Visit via Video Note   I connected with [name] on 03/28/21 at 11:00 AM EDT by a video enabled telemedicine application and verified that I am speaking with the correct person using two identifiers.   Location: Patient: Home Provider: Office   I discussed the limitations of evaluation and management by telemedicine and the availability of in person appointments. The patient expressed understanding and agreed to proceed.     THERAPIST PROGRESS NOTE   Session Time: 11:00 AM-11:30AM   Participation Level: Active   Behavioral Response: CasualAlertDepressed   Type of Therapy: Individual Therapy   Treatment Goals addressed: Coping   Interventions: CBT   Summary: Lindsay Walsh. Lindsay Walsh is a 50 y.o. female who presents with Depression, ADHD, PTSD  . The OPT therapist worked with the patient for her ongoing OPT treatment session. The OPT therapist utilized Motivational Interviewing to assist in creating therapeutic repore. The patient in the session was engaged and work in collaboration giving feedback about her triggers and symptoms over the past few weeks. 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  The patient spoke about her son and his fiance are closing on a home this week and if all goes well the patient is looking forward to help them get the new house organized in the upcoming weekend.   Suicidal/Homicidal: Nowithout intent/plan   Therapist Response: The OPT therapist worked with the patient for the patients scheduled session. The patient was engaged in her session and gave feedback in relation to triggers, symptoms, and  behavior responses over the past few weeks. The OPT therapist worked with the patient utilizing an in session Cognitive Behavioral Therapy exercise. The patient was responsive in the session and verbalized," It was so peaceful and its been all I have been talking about since going to the animal expo and I cant wait to go back and take family and I am things about volunteering their". The OPT therapist worked with the patient talking with her about change of life stage and working on implementing positive thinking, maintaining with basic self care and  continuing to fill her time with meaningful events/ community involvement.. The OPT therapist will continue treatment work with the patient in her next scheduled session   Plan: Return again in 3 weeks.   Diagnosis:      Axis I: Depressive Disorder unspecified, ADHD inattentive type, PTSD.                             Axis II: No diagnosis   I discussed the assessment and treatment plan with the patient. The patient was provided an opportunity to ask questions and all were answered. The patient agreed with the plan and demonstrated an understanding of the instructions.   The patient was advised to call back or seek an in-person evaluation if the symptoms worsen or if the condition fails to improve as anticipated.   I provided 30 minutes of non-face-to-face time during this encounter.   Lennox Grumbles, LCSW  03/28/2021

## 2021-04-04 ENCOUNTER — Encounter: Payer: Self-pay | Admitting: Nutrition

## 2021-04-04 ENCOUNTER — Encounter: Payer: Medicare Other | Attending: Nurse Practitioner | Admitting: Nutrition

## 2021-04-04 ENCOUNTER — Other Ambulatory Visit: Payer: Self-pay

## 2021-04-04 VITALS — Ht 60.0 in | Wt 198.6 lb

## 2021-04-04 DIAGNOSIS — E1143 Type 2 diabetes mellitus with diabetic autonomic (poly)neuropathy: Secondary | ICD-10-CM | POA: Insufficient documentation

## 2021-04-04 DIAGNOSIS — E1159 Type 2 diabetes mellitus with other circulatory complications: Secondary | ICD-10-CM | POA: Insufficient documentation

## 2021-04-04 DIAGNOSIS — I10 Essential (primary) hypertension: Secondary | ICD-10-CM | POA: Insufficient documentation

## 2021-04-04 DIAGNOSIS — E782 Mixed hyperlipidemia: Secondary | ICD-10-CM | POA: Diagnosis present

## 2021-04-04 DIAGNOSIS — K3184 Gastroparesis: Secondary | ICD-10-CM | POA: Diagnosis present

## 2021-04-04 NOTE — Patient Instructions (Signed)
Goals  Follow My Plate Cut out sodas and drink only water Ask about what hot tea you can drink Cut out fast foods/processed foods Increase fresh fruits and vegetables. Start walking 2-3 times per week. Sign up at the [facility] Test blood sugars 4 times per day Keep food journal daily

## 2021-04-04 NOTE — Progress Notes (Signed)
Medical Nutrition Therapy  Appointment Start time:  0109  Appointment End time:  1430  Primary concerns today: Diabetes Type 2, DM  Referral diagnosis: E11.8, E66.01 Preferred learning style: no preference . Learning readiness: Ready   Wt Readings from Last 3 Encounters:  10/19/20 189 lb 4.8 oz (85.9 kg)  09/14/20 192 lb 12.8 oz (87.5 kg)  08/03/20 192 lb 6.4 oz (87.3 kg)   Ht Readings from Last 3 Encounters:  10/19/20 5' (1.524 m)  09/14/20 5' (1.524 m)  08/03/20 5' (1.524 m)   There is no height or weight on file to calculate BMI. @BMIFA @ Facility age limit for growth percentiles is 20 years. Facility age limit for growth percentiles is 20 years. NUTRITION ASSESSMENT  Current diet is high in processed foods, salt, simple CHO and empty calories. Low in fresh fruits, vegetables and whole grains.  Anthropometrics  Wt Readings from Last 3 Encounters:  04/04/21 198 lb 9.6 oz (90.1 kg)  10/19/20 189 lb 4.8 oz (85.9 kg)  09/14/20 192 lb 12.8 oz (87.5 kg)   Ht Readings from Last 3 Encounters:  04/04/21 5' (1.524 m)  10/19/20 5' (1.524 m)  09/14/20 5' (1.524 m)   Body mass index is 38.79 kg/m. @BMIFA @ Facility age limit for growth percentiles is 20 years. Facility age limit for growth percentiles is 20 years.    Clinical Medical Hx: DM, Type 2, Obesity, GERD, Autoimmune Hepatitis , Gastroparesis, depression Medications: Metformin 3235 mg BID, Trulicity Labs: T7D 2.2% May 24.22.  FBS:  Hasn't been testing due to her depression. Going back to [facility] to start exercising. Sees a therapist for her depression.  Lab Results  Component Value Date   HGBA1C 11.4 (H) 08/03/2020   CMP Latest Ref Rng & Units 08/03/2020 01/25/2020 01/21/2020  Glucose 65 - 139 mg/dL 556(HH) 183(H) 172(H)  BUN 7 - 25 mg/dL 11 13 8   Creatinine 0.50 - 1.05 mg/dL 0.79 0.68 0.71  Sodium 135 - 146 mmol/L 132(L) 139 140  Potassium 3.5 - 5.3 mmol/L 5.0 4.0 4.6  Chloride 98 - 110 mmol/L 96(L) 103 101  CO2  20 - 32 mmol/L 23 25 28   Calcium 8.6 - 10.4 mg/dL 9.3 8.9 9.4  Total Protein 6.1 - 8.1 g/dL 6.7 8.2(H) 6.4  Total Bilirubin 0.2 - 1.2 mg/dL 0.4 0.8 0.2  Alkaline Phos 38 - 126 U/L - 107 108  AST 10 - 35 U/L 19 24 20   ALT 6 - 29 U/L 21 25 22    Lipid Panel     Component Value Date/Time   CHOL 153 04/25/2019 0000   TRIG 141 04/25/2019 0000   TRIG 151 03/04/2008 0000   HDL 37 04/25/2019 0000   CHOLHDL 4.9 03/06/2015 0522   VLDL 16 03/06/2015 0522   LDLCALC 91 04/25/2019 0000   LDLCALC 108t 03/04/2008 0000    Notable Signs/Symptoms: tired, low energy, increased thirst, frequent urination, blurry vision,   Lifestyle & Dietary Hx LIves with boyfriend. She cooks and shops for groceries  Estimated daily fluid intake: 8  oz Supplements: VIt C,VMI,, [place] VIt D, Vit D  Sleep: 5-6 Stress / self-care: depression, illnesses Current average weekly physical activity: ADL  24-Hr Dietary Recall First Meal: 1- a, Sausage biscuit, hashbrowns. Sprite , Coffee with creamer and sugar Snack:  Second Meal: Pizza  2 slices and cheese sticks, w slices, Sprite, Snack: Bag of chips,  Third Meal: Taco pizza from [employer] and sprite Snack:  Beverages: sprite  Estimated Energy Needs Calories: 1200  Carbohydrate: 135g Protein: 90g Fat: 33g   NUTRITION DIAGNOSIS  NB-1.1 Food and nutrition-related knowledge deficit As related to Diabetes Type 2.  As evidenced by A1C 11.4%.   NUTRITION INTERVENTION  Nutrition education (E-1) on the following topics:  Nutrition and Diabetes education provided on My Plate, CHO counting, meal planning, portion sizes, timing of meals, avoiding snacks between meals unless having a low blood sugar, target ranges for A1C and blood sugars, signs/symptoms and treatment of hyper/hypoglycemia, monitoring blood sugars, taking medications as prescribed, benefits of exercising 30 minutes per day and prevention of complications of DM. Plant based nutrition lifestyle  Handouts  Provided Include  Plant based my plate Meal Plan Card Diabetes instructions.   Learning Style & Readiness for Change Teaching method utilized: Visual & Auditory  Demonstrated degree of understanding via: Teach Back  Barriers to learning/adherence to lifestyle change: mental health-depression  Goals Established by Pt Goals  Follow My Plate Cut out sodas and drink only water Ask about what hot tea you can drink Cut out fast foods/processed foods Increase fresh fruits and vegetables. Start walking 2-3 times per week. Sign up at the [facility] Test blood sugars 4 times per day Keep food journal daily   MONITORING & EVALUATION Dietary intake, weekly physical activity, and blood sugars  in 1 month.  Next Steps  Patient is to work on meal planning and testing blood sugars.

## 2021-04-05 ENCOUNTER — Encounter: Payer: Self-pay | Admitting: Nutrition

## 2021-04-13 ENCOUNTER — Other Ambulatory Visit (HOSPITAL_COMMUNITY): Payer: Self-pay | Admitting: Nurse Practitioner

## 2021-04-13 DIAGNOSIS — Z1231 Encounter for screening mammogram for malignant neoplasm of breast: Secondary | ICD-10-CM

## 2021-04-15 ENCOUNTER — Telehealth (INDEPENDENT_AMBULATORY_CARE_PROVIDER_SITE_OTHER): Payer: Medicare Other | Admitting: Psychiatry

## 2021-04-15 ENCOUNTER — Encounter (HOSPITAL_COMMUNITY): Payer: Self-pay | Admitting: Psychiatry

## 2021-04-15 ENCOUNTER — Other Ambulatory Visit (INDEPENDENT_AMBULATORY_CARE_PROVIDER_SITE_OTHER): Payer: Self-pay | Admitting: Internal Medicine

## 2021-04-15 ENCOUNTER — Other Ambulatory Visit: Payer: Self-pay

## 2021-04-15 DIAGNOSIS — F4312 Post-traumatic stress disorder, chronic: Secondary | ICD-10-CM | POA: Diagnosis not present

## 2021-04-15 DIAGNOSIS — F900 Attention-deficit hyperactivity disorder, predominantly inattentive type: Secondary | ICD-10-CM

## 2021-04-15 DIAGNOSIS — F321 Major depressive disorder, single episode, moderate: Secondary | ICD-10-CM

## 2021-04-15 DIAGNOSIS — F515 Nightmare disorder: Secondary | ICD-10-CM

## 2021-04-15 MED ORDER — AMPHETAMINE-DEXTROAMPHETAMINE 20 MG PO TABS: 20.0000 mg | ORAL_TABLET | Freq: Two times a day (BID) | ORAL | 0 refills | Status: DC

## 2021-04-15 MED ORDER — PRAZOSIN HCL 2 MG PO CAPS: 2.0000 mg | ORAL_CAPSULE | Freq: Every day | ORAL | 2 refills | Status: DC

## 2021-04-15 MED ORDER — DULOXETINE HCL 60 MG PO CPEP: 60.0000 mg | ORAL_CAPSULE | Freq: Two times a day (BID) | ORAL | 2 refills | Status: DC
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
## 2021-04-19 ENCOUNTER — Other Ambulatory Visit: Payer: Self-pay

## 2021-04-19 ENCOUNTER — Ambulatory Visit (INDEPENDENT_AMBULATORY_CARE_PROVIDER_SITE_OTHER): Payer: Medicare Other | Admitting: Clinical

## 2021-04-19 DIAGNOSIS — F900 Attention-deficit hyperactivity disorder, predominantly inattentive type: Secondary | ICD-10-CM | POA: Diagnosis not present

## 2021-04-19 DIAGNOSIS — F4310 Post-traumatic stress disorder, unspecified: Secondary | ICD-10-CM | POA: Diagnosis not present

## 2021-04-19 DIAGNOSIS — F321 Major depressive disorder, single episode, moderate: Secondary | ICD-10-CM
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
## 2021-04-20 ENCOUNTER — Telehealth: Payer: Self-pay | Admitting: Nutrition

## 2021-04-20 ENCOUNTER — Ambulatory Visit: Payer: Medicare Other | Admitting: Nutrition

## 2021-04-20 NOTE — Telephone Encounter (Signed)
Vm left to call and reschedule missed appt.

## 2021-05-10 ENCOUNTER — Ambulatory Visit (HOSPITAL_COMMUNITY): Payer: Medicare Other | Admitting: Clinical

## 2021-05-10 ENCOUNTER — Telehealth (HOSPITAL_COMMUNITY): Payer: Self-pay | Admitting: Clinical

## 2021-05-10 ENCOUNTER — Other Ambulatory Visit: Payer: Self-pay

## 2021-05-10 NOTE — Telephone Encounter (Signed)
Pt did not respond to contact attempts

## 2021-05-11 ENCOUNTER — Inpatient Hospital Stay (HOSPITAL_COMMUNITY): Admission: RE | Admit: 2021-05-11 | Payer: Medicare Other | Source: Ambulatory Visit

## 2021-05-17 ENCOUNTER — Ambulatory Visit (INDEPENDENT_AMBULATORY_CARE_PROVIDER_SITE_OTHER): Payer: Medicare Other | Admitting: Internal Medicine

## 2021-05-17 ENCOUNTER — Other Ambulatory Visit: Payer: Self-pay

## 2021-05-17 ENCOUNTER — Encounter (INDEPENDENT_AMBULATORY_CARE_PROVIDER_SITE_OTHER): Payer: Self-pay | Admitting: Internal Medicine

## 2021-05-17 VITALS — BP 110/78 | HR 89 | Temp 98.2°F | Ht 60.0 in | Wt 191.8 lb

## 2021-05-17 DIAGNOSIS — K580 Irritable bowel syndrome with diarrhea: Secondary | ICD-10-CM

## 2021-05-17 DIAGNOSIS — E1143 Type 2 diabetes mellitus with diabetic autonomic (poly)neuropathy: Secondary | ICD-10-CM | POA: Diagnosis not present

## 2021-05-17 DIAGNOSIS — K754 Autoimmune hepatitis: Secondary | ICD-10-CM | POA: Diagnosis not present

## 2021-05-17 DIAGNOSIS — K3184 Gastroparesis: Secondary | ICD-10-CM | POA: Diagnosis not present

## 2021-05-17 NOTE — Progress Notes (Signed)
Presenting complaint;  Follow for gastroparesis, IBS and autoimmune hepatitis.  Database and subjective:  Patient is 50 year old Caucasian female with history of autoimmune hepatitis(diagnosed February 2004) history of chronic GERD and diabetic gastroparesis(diagnosed January 2021) as well as history of IBS who is here for scheduled visit. Her last EGD and colonoscopy were in October 2019 as [facility].  EGD was normal and colonoscopy revealed single small tubular adenoma.  Random biopsies were negative for microscopic colitis.  Patient states she was doing well until she ran out of domperidone.  [place] of [place].  She is watching her diet.  She says she has had 2 episodes of vomiting in the last 30 days.  Vomiting is occurred usually at night or in the morning.  She has seen dietitian in the past and watching her diet.  She has heartburn with certain foods and it is not very often.  She has 2-3 bowel movements per day.  Stool consistency is normal to soft or loose.  She denies melena or rectal bleeding.  She does not take dicyclomine often. Her appetite is good.  She has not lost any weight.  She states she is joining [employer] so that she could do regular physical activity. She is taking ibuprofen 2-3 times a week.  She always takes with food.  She is aware of the potential side effects.  She does not take furosemide daily.   Current Medications: Outpatient Encounter Medications as of 05/17/2021  Medication Sig   Accu-Chek Softclix Lancets lancets CHECK SUGAR ONCE DAILY   alclomethasone (ACLOVATE) 0.05 % cream Apply topically at bedtime.   amphetamine-dextroamphetamine (ADDERALL) 20 MG tablet Take 1 tablet (20 mg total) by mouth 2 (two) times daily.   amphetamine-dextroamphetamine (ADDERALL) 20 MG tablet Take 1 tablet (20 mg total) by mouth 2 (two) times daily.   amphetamine-dextroamphetamine (ADDERALL) 20 MG tablet Take 1 tablet (20 mg total) by mouth  2 (two) times daily.   Ascorbic Acid (VITAMIN C) 1000 MG tablet Take 500 mg by mouth every morning.    aspirin 325 MG EC tablet Take 325 mg by mouth daily.   azaTHIOprine (IMURAN) 50 MG tablet Take 1 tablet (50 mg total) by mouth daily.   bimatoprost (LUMIGAN) 0.01 % SOLN Place 1 drop into both eyes at bedtime.   calcium-vitamin D (OSCAL WITH D) 500-200 MG-UNIT tablet Take 1 tablet by mouth 2 (two) times daily.   cycloSPORINE (RESTASIS) 0.05 % ophthalmic emulsion Place 1 drop into both eyes daily.   dicyclomine (BENTYL) 10 MG capsule Take 1 capsule (10 mg total) by mouth 2 (two) times daily as needed.   Dulaglutide (TRULICITY) 2.95 FM/7.3UY SOPN Inject 0.75 mg into the skin once a week.   DULoxetine (CYMBALTA) 60 MG capsule Take 1 capsule (60 mg total) by mouth 2 (two) times daily.   esomeprazole (NEXIUM) 40 MG capsule TAKE 1 CAPSULE(40 MG) BY MOUTH EVERY MORNING   fexofenadine (ALLEGRA) 60 MG tablet Take 1 tablet by mouth 2 (two) times daily as needed for allergies.    furosemide (LASIX) 40 MG tablet Take 40 mg by mouth every other day.   gabapentin (NEURONTIN) 300 MG capsule Take 300 mg by mouth at bedtime.   ibuprofen (ADVIL) 800 MG tablet as needed.   lisinopril (ZESTRIL) 40 MG tablet TAKE 1 TABLET(40 MG) BY MOUTH DAILY   meclizine (ANTIVERT) 12.5 MG tablet Take 1 tablet (12.5 mg total) by mouth 3 (three) times daily as needed for  dizziness.   metFORMIN (GLUCOPHAGE) 1000 MG tablet Take 1 tablet (1,000 mg total) by mouth 2 (two) times daily with a meal.   metoprolol succinate (TOPROL-XL) 100 MG 24 hr tablet TAKE 1 TABLET(100 MG) BY MOUTH DAILY WITH FOOD (Patient taking differently: Take 150 mg by mouth daily.)   mirabegron ER (MYRBETRIQ) 50 MG TB24 tablet Take 1 tablet (50 mg total) by mouth daily.   Multiple Vitamins-Minerals (CENTRUM) tablet Take 1 tablet by mouth daily.   nitroGLYCERIN (NITROSTAT) 0.4 MG SL tablet as needed.   NP THYROID 60 MG tablet TAKE 1 TABLET(60 MG) BY MOUTH DAILY  BEFORE BREAKFAST   ondansetron (ZOFRAN) 4 MG tablet Take 1 tablet (4 mg total) by mouth every 8 (eight) hours as needed for nausea or vomiting.   pramipexole (MIRAPEX) 1 MG tablet Take 1 mg by mouth daily.   prazosin (MINIPRESS) 2 MG capsule Take 1 capsule (2 mg total) by mouth at bedtime.   PRESCRIPTION MEDICATION Take 1 tablet by mouth daily. Take 1 tablet by mouth daily before food and at bedtime.   This is MOTILLIUM ( DOMPERIDONE 10 mg) Tablet   Probiotic Product (RESTORA PO) Take by mouth daily.    rosuvastatin (CRESTOR) 40 MG tablet rosuvastatin 40 mg tablet   [DISCONTINUED] guaiFENesin (MUCINEX) 600 MG 12 hr tablet Take 1 tablet (600 mg total) by mouth 2 (two) times daily.   [DISCONTINUED] albuterol (PROVENTIL) (2.5 MG/3ML) 0.083% nebulizer solution Inhale into the lungs as needed. (Patient not taking: Reported on 04/04/2021)   [DISCONTINUED] albuterol (VENTOLIN HFA) 108 (90 Base) MCG/ACT inhaler Inhale 2 puffs into the lungs every 6 (six) hours as needed for wheezing or shortness of breath. (Patient not taking: Reported on 04/04/2021)   [DISCONTINUED] hydrALAZINE (APRESOLINE) 100 MG tablet Take 100 mg by mouth 4 (four) times daily.  (Patient not taking: Reported on 04/04/2021)   [DISCONTINUED] metFORMIN (GLUCOPHAGE) 500 MG tablet TAKE 1 TABLET BY MOUTH DAILY   [DISCONTINUED] NP THYROID 90 MG tablet Take 1 tablet (90 mg total) by mouth daily.   No facility-administered encounter medications on file as of 05/17/2021.     Objective: Blood pressure 110/78, pulse 89, temperature 98.2 F (36.8 C), temperature source Oral, height 5' (1.524 m), weight 191 lb 12.8 oz (87 kg). Patient is alert and in no acute distress. Conjunctiva is pink. Sclera is nonicteric Oropharyngeal mucosa is normal. No neck masses or thyromegaly noted. Cardiac exam with regular rhythm normal S1 and S2. No murmur or gallop noted. Lungs are clear to auscultation. Abdomen is symmetrical.  She has mild tenderness in  epigastrium and left lower quadrant.  No organomegaly or masses. No LE edema or clubbing noted.  Labs/studies Results:   CBC Latest Ref Rng & Units 01/25/2020 10/20/2019 07/06/2019  WBC 4.0 - 10.5 K/uL 11.8(H) 9.0 12.7(H)  Hemoglobin 12.0 - 15.0 g/dL 14.5 12.7 12.8  Hematocrit 36.0 - 46.0 % 43.2 37.8 37.8  Platelets 150 - 400 K/uL 316 300 258    CMP Latest Ref Rng & Units 08/03/2020 01/25/2020 01/21/2020  Glucose 65 - 139 mg/dL 556(HH) 183(H) 172(H)  BUN 7 - 25 mg/dL 11 13 8   Creatinine 0.50 - 1.05 mg/dL 0.79 0.68 0.71  Sodium 135 - 146 mmol/L 132(L) 139 140  Potassium 3.5 - 5.3 mmol/L 5.0 4.0 4.6  Chloride 98 - 110 mmol/L 96(L) 103 101  CO2 20 - 32 mmol/L 23 25 28   Calcium 8.6 - 10.4 mg/dL 9.3 8.9 9.4  Total Protein 6.1 - 8.1 g/dL  6.7 8.2(H) 6.4  Total Bilirubin 0.2 - 1.2 mg/dL 0.4 0.8 0.2  Alkaline Phos 38 - 126 U/L - 107 108  AST 10 - 35 U/L 19 24 20   ALT 6 - 29 U/L 21 25 22     Hepatic Function Latest Ref Rng & Units 08/03/2020 01/25/2020 01/21/2020  Total Protein 6.1 - 8.1 g/dL 6.7 8.2(H) 6.4  Albumin 3.5 - 5.0 g/dL - 4.4 4.0  AST 10 - 35 U/L 19 24 20   ALT 6 - 29 U/L 21 25 22   Alk Phosphatase 38 - 126 U/L - 107 108  Total Bilirubin 0.2 - 1.2 mg/dL 0.4 0.8 0.2  Bilirubin, Direct <=0.2 mg/dL - - -      Assessment:  #1.  Diabetic gastroparesis.  She has run out of domperidone and having breakthrough symptoms.  Hopefully she can receive medication in near future.  Unfortunately she is not a candidate for metoclopramide.  #2.  Chronic GERD.  She had normal EGD at [facility] in October 2019.  Heartburn is well controlled with PPI and diet.  #3.  IBS.  She is only having sporadic diarrhea and not requiring dicyclomine often.  #3.  History of autoimmune hepatitis.  She has remained in remission on low-dose azathioprine. She is due for blood work unless she had CBC and LFTs by Dr. Maudie Mercury.  #4.  History of colonic adenoma.  She had colonoscopy in 2019 at [facility] with removal of small tubular  adenoma.  Her family history is negative for colorectal carcinoma.  Therefore she can wait until October 2026 before next colonoscopy.   Plan:  Request copy of recent blood work from Dr. Julianne Rice office before any tests ordered. Follow-up in 6 months.

## 2021-05-17 NOTE — Patient Instructions (Signed)
Will request copy of recent blood tests from Dr. Julianne Rice office before any tests ordered

## 2021-06-08 ENCOUNTER — Telehealth (HOSPITAL_COMMUNITY): Payer: Self-pay | Admitting: Clinical

## 2021-06-08 NOTE — Telephone Encounter (Signed)
Called to schedule f/u unable to leave vm

## 2021-06-14 ENCOUNTER — Ambulatory Visit (INDEPENDENT_AMBULATORY_CARE_PROVIDER_SITE_OTHER): Payer: Medicare Other | Admitting: Internal Medicine

## 2021-06-22 ENCOUNTER — Other Ambulatory Visit (INDEPENDENT_AMBULATORY_CARE_PROVIDER_SITE_OTHER): Payer: Self-pay | Admitting: Internal Medicine

## 2021-06-23 NOTE — Telephone Encounter (Signed)
Last visit 05/17/21

## 2021-06-27 ENCOUNTER — Other Ambulatory Visit (INDEPENDENT_AMBULATORY_CARE_PROVIDER_SITE_OTHER): Payer: Self-pay | Admitting: Gastroenterology

## 2021-06-27 NOTE — Telephone Encounter (Signed)
Needs updated CMP and CBC if she has not had them done elsewhere.

## 2021-06-27 NOTE — Telephone Encounter (Signed)
Cmp and cbc done on 05/13/21. Copy of labs on your desk for review.

## 2021-07-09 ENCOUNTER — Other Ambulatory Visit (HOSPITAL_COMMUNITY): Payer: Self-pay | Admitting: Psychiatry

## 2021-07-09 DIAGNOSIS — F515 Nightmare disorder: Secondary | ICD-10-CM

## 2021-07-14 ENCOUNTER — Telehealth: Payer: Self-pay | Admitting: Nutrition

## 2021-07-14 NOTE — Telephone Encounter (Signed)
Unable to reach patient via phone number given. No VM, no answer.

## 2021-07-15 ENCOUNTER — Encounter (INDEPENDENT_AMBULATORY_CARE_PROVIDER_SITE_OTHER): Payer: Self-pay

## 2021-07-25 ENCOUNTER — Ambulatory Visit: Payer: Medicare Other | Admitting: Nurse Practitioner

## 2021-07-27 ENCOUNTER — Telehealth (HOSPITAL_COMMUNITY): Payer: Medicare Other | Admitting: Psychiatry

## 2021-07-28 ENCOUNTER — Other Ambulatory Visit: Payer: Self-pay

## 2021-07-28 ENCOUNTER — Telehealth (HOSPITAL_COMMUNITY): Payer: Medicare Other | Admitting: Psychiatry

## 2021-07-28 ENCOUNTER — Other Ambulatory Visit (INDEPENDENT_AMBULATORY_CARE_PROVIDER_SITE_OTHER): Payer: Self-pay | Admitting: Gastroenterology

## 2021-08-01 ENCOUNTER — Encounter (HOSPITAL_COMMUNITY): Payer: Self-pay

## 2021-08-05 NOTE — Telephone Encounter (Signed)
Tried calling patient and call could not go through at this time.  Chelsea last note on rx states she needs labs and dr Olevia Perches last note states she needs labs if not done through pcp. Tried to call pt to see if she had labs or to put in orders that chelsea said pt needed at last refill for pt to do. When calling phone it says call could not go through at this time. Patient seen November 2022

## 2021-08-09 ENCOUNTER — Ambulatory Visit (INDEPENDENT_AMBULATORY_CARE_PROVIDER_SITE_OTHER): Payer: Medicare Other | Admitting: Clinical

## 2021-08-09 ENCOUNTER — Other Ambulatory Visit: Payer: Self-pay

## 2021-08-09 DIAGNOSIS — F321 Major depressive disorder, single episode, moderate: Secondary | ICD-10-CM

## 2021-08-09 DIAGNOSIS — F900 Attention-deficit hyperactivity disorder, predominantly inattentive type: Secondary | ICD-10-CM | POA: Diagnosis not present

## 2021-08-09 DIAGNOSIS — F4310 Post-traumatic stress disorder, unspecified: Secondary | ICD-10-CM | POA: Diagnosis not present

## 2021-08-09 NOTE — Telephone Encounter (Signed)
Had cbc and cmp on 05/12/21 that was scanned into system.

## 2021-08-09 NOTE — Progress Notes (Signed)
Virtual Visit via Telephone Note  I connected with Lindsay Walsh on 08/09/21 at  3:00 PM EST by telephone and verified that I am speaking with the correct person using two identifiers.  Location: Patient: Home Provider: Office   I discussed the limitations, risks, security and privacy concerns of performing an evaluation and management service by telephone and the availability of in person appointments. I also discussed with the patient that there may be a patient responsible charge related to this service. The patient expressed understanding and agreed to proceed.  THERAPIST PROGRESS NOTE   Session Time: 3:00 PM-3:45 PM   Participation Level: Active   Behavioral Response: CasualAlertDepressed   Type of Therapy: Individual Therapy   Treatment Goals addressed: Coping   Interventions: CBT   Summary: Lindsay Walsh. Lindsay Walsh is a 51 y.o. female who presents with Depression, ADHD, PTSD  . The OPT therapist worked with the patient for her ongoing OPT treatment session. The OPT therapist utilized Motivational Interviewing to assist in creating therapeutic repore. The patient in the session was engaged and work in collaboration giving feedback about her triggers and symptoms over the past few weeks. The patient spoke about working to implement coping taking walks, going on [employer], socializing, and community outings..The OPT therapist utilized Cognitive Behavioral Therapy through cognitive restructuring as well as worked with the patient on coping strategies to assist in management of mood. The patient identified watching the news as triggering and is working to not watch news.   Suicidal/Homicidal: Nowithout intent/plan   Therapist Response: The OPT therapist worked with the patient for the patients scheduled session. The patient was engaged in her session and gave feedback in relation to triggers, symptoms, and behavior responses over the past few weeks. The OPT therapist worked with the patient  utilizing an in session Cognitive Behavioral Therapy exercise. The patient was responsive in the session and verbalized," I see these bad things in the news and it triggers me to worry about something bad happening to my family members and little ones and then I get overwhelmed so I have been trying to not watch the news". The OPT therapist worked with the patient on placing her energy/focus/and attention in areas of her life she has control over and doing her self check ins throughout the week.  The patient spoke about her trauma in losing a loved one and how this trauma has caused her to be more protective even over protective of her other family members. The OPT therapist provided grief counseling.The OPT therapist will continue treatment work with the patient in her next scheduled session   Plan: Return again in 3 weeks.   Diagnosis       Axis I: Depressive Disorder unspecified, ADHD inattentive type, PTSD.                             Axis II: No diagnosis   I discussed the assessment and treatment plan with the patient. The patient was provided an opportunity to ask questions and all were answered. The patient agreed with the plan and demonstrated an understanding of the instructions.   The patient was advised to call back or seek an in-person evaluation if the symptoms worsen or if the condition fails to improve as anticipated.   I provided 45 minutes of non-face-to-face time during this encounter.   Lennox Grumbles, LCSW   08/09/2021

## 2021-08-12 ENCOUNTER — Telehealth (INDEPENDENT_AMBULATORY_CARE_PROVIDER_SITE_OTHER): Payer: Medicare Other | Admitting: Psychiatry

## 2021-08-12 ENCOUNTER — Other Ambulatory Visit: Payer: Self-pay

## 2021-08-12 ENCOUNTER — Encounter (HOSPITAL_COMMUNITY): Payer: Self-pay | Admitting: Psychiatry

## 2021-08-12 DIAGNOSIS — F515 Nightmare disorder: Secondary | ICD-10-CM | POA: Diagnosis not present

## 2021-08-12 DIAGNOSIS — F4310 Post-traumatic stress disorder, unspecified: Secondary | ICD-10-CM

## 2021-08-12 DIAGNOSIS — F4312 Post-traumatic stress disorder, chronic: Secondary | ICD-10-CM

## 2021-08-12 DIAGNOSIS — F900 Attention-deficit hyperactivity disorder, predominantly inattentive type: Secondary | ICD-10-CM

## 2021-08-12 MED ORDER — AMPHETAMINE-DEXTROAMPHETAMINE 20 MG PO TABS
20.0000 mg | ORAL_TABLET | Freq: Two times a day (BID) | ORAL | 0 refills | Status: DC
Start: 2021-08-12 — End: 2021-11-09

## 2021-08-12 MED ORDER — DULOXETINE HCL 60 MG PO CPEP: ORAL_CAPSULE | ORAL | 2 refills | Status: DC

## 2021-08-12 MED ORDER — AMPHETAMINE-DEXTROAMPHETAMINE 20 MG PO TABS: 20.0000 mg | ORAL_TABLET | Freq: Two times a day (BID) | ORAL | 0 refills | Status: DC

## 2021-08-12 MED ORDER — PRAZOSIN HCL 2 MG PO CAPS: ORAL_CAPSULE | ORAL | 2 refills | Status: DC
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
## 2021-09-02 ENCOUNTER — Other Ambulatory Visit (INDEPENDENT_AMBULATORY_CARE_PROVIDER_SITE_OTHER): Payer: Self-pay | Admitting: *Deleted

## 2021-09-02 DIAGNOSIS — K580 Irritable bowel syndrome with diarrhea: Secondary | ICD-10-CM

## 2021-09-02 DIAGNOSIS — K754 Autoimmune hepatitis: Secondary | ICD-10-CM

## 2021-09-05 ENCOUNTER — Ambulatory Visit (INDEPENDENT_AMBULATORY_CARE_PROVIDER_SITE_OTHER): Payer: Medicare Other | Admitting: Clinical

## 2021-09-05 ENCOUNTER — Other Ambulatory Visit: Payer: Self-pay

## 2021-09-05 DIAGNOSIS — F900 Attention-deficit hyperactivity disorder, predominantly inattentive type: Secondary | ICD-10-CM | POA: Diagnosis not present

## 2021-09-05 DIAGNOSIS — F4310 Post-traumatic stress disorder, unspecified: Secondary | ICD-10-CM

## 2021-09-05 DIAGNOSIS — F32A Depression, unspecified: Secondary | ICD-10-CM

## 2021-09-05 NOTE — Progress Notes (Signed)
Virtual Visit via Telephone Note   I connected with Lindsay Walsh on 09/05/21 at  3:00 PM EST by telephone and verified that I am speaking with the correct person using two identifiers.   Location: Patient: Home Provider: Office   I discussed the limitations, risks, security and privacy concerns of performing an evaluation and management service by telephone and the availability of in person appointments. I also discussed with the patient that there may be a patient responsible charge related to this service. The patient expressed understanding and agreed to proceed.   THERAPIST PROGRESS NOTE   Session Time: 3:00 PM-3:45 PM   Participation Level: Active   Behavioral Response: CasualAlertDepressed   Type of Therapy: Individual Therapy   Treatment Goals addressed: Coping   Interventions: CBT   Summary: Lindsay Tyndall. Walsh is a 51 y.o. female who presents with Depression, ADHD, PTSD  . The OPT therapist worked with the patient for her ongoing OPT treatment session. The OPT therapist utilized Motivational Interviewing to assist in creating therapeutic repore. The patient in the session was engaged and work in collaboration giving feedback about her triggers and symptoms over the past few weeks. The patient spoke about working to implement coping taking walks, going on [employer], socializing, and community outings..The OPT therapist utilized Cognitive Behavioral Therapy through cognitive restructuring as well as worked with the patient on coping strategies to assist in management of mood. The patient identified watching the news as triggering and is working to not watch news.   Suicidal/Homicidal: Nowithout intent/plan   Therapist Response: The OPT therapist worked with the patient for the patients scheduled session. The patient was engaged in her session and gave feedback in relation to triggers, symptoms, and behavior responses over the past few weeks. The OPT therapist worked with the  patient utilizing an in session Cognitive Behavioral Therapy exercise. The patient was responsive in the session and verbalized," I know I have to start re-investing in me and not have guilt in spending a little on myself, and I need to try to use sticky notes to help give me that little push to do more for me". The OPT therapist worked with the patient on placing her energy/focus/and attention in areas of her life she has control over and doing her self check ins throughout the week.  The patient spoke about her commitment to change and do more for herself with her son moving out and her having more free time on her hands.The patient verbalized considering moving forward with joining the [employer].The OPT therapist will continue treatment work with the patient in her next scheduled session   Plan: Return again in 3 weeks.   Diagnosis       Axis I: Depressive Disorder unspecified, ADHD inattentive type, PTSD.                             Axis II: No diagnosis    Collaboration of Care: No additional collaboration of care for this session.  Patient/Guardian was advised Release of Information must be obtained prior to any record release in order to collaborate their care with an outside provider. Patient/Guardian was advised if they have not already done so to contact the registration department to sign all necessary forms in order for [place] to release information regarding their care.   Consent: Patient/Guardian gives verbal consent for treatment and assignment of benefits for services provided during this visit. Patient/Guardian expressed understanding and agreed  to proceed.   I discussed the assessment and treatment plan with the patient. The patient was provided an opportunity to ask questions and all were answered. The patient agreed with the plan and demonstrated an understanding of the instructions.   The patient was advised to call back or seek an in-person evaluation if the symptoms worsen or if the  condition fails to improve as anticipated.   I provided 45 minutes of non-face-to-face time during this encounter.   Lennox Grumbles, LCSW    09/05/2021

## 2021-09-06 ENCOUNTER — Telehealth (HOSPITAL_COMMUNITY): Payer: Self-pay | Admitting: Clinical

## 2021-09-06 NOTE — Telephone Encounter (Signed)
Called to reschedule appt due to scheduling error, requested call back to schedule appt eith TC

## 2021-09-16 ENCOUNTER — Telehealth (INDEPENDENT_AMBULATORY_CARE_PROVIDER_SITE_OTHER): Payer: Self-pay

## 2021-10-03 ENCOUNTER — Ambulatory Visit (INDEPENDENT_AMBULATORY_CARE_PROVIDER_SITE_OTHER): Payer: Medicare Other | Admitting: Clinical

## 2021-10-03 ENCOUNTER — Other Ambulatory Visit: Payer: Self-pay

## 2021-10-03 DIAGNOSIS — F4310 Post-traumatic stress disorder, unspecified: Secondary | ICD-10-CM

## 2021-10-03 DIAGNOSIS — F32A Depression, unspecified: Secondary | ICD-10-CM | POA: Diagnosis not present

## 2021-10-03 DIAGNOSIS — F900 Attention-deficit hyperactivity disorder, predominantly inattentive type: Secondary | ICD-10-CM

## 2021-10-03 NOTE — Progress Notes (Signed)
Virtual Visit via Telephone Note ?  ?I connected with Lindsay Walsh on 10/03/21 at  3:00 PM EST by telephone and verified that I am speaking with the correct person using two identifiers. ?  ?Location: ?Patient: Home ?Provider: Office ?  ?I discussed the limitations, risks, security and privacy concerns of performing an evaluation and management service by telephone and the availability of in person appointments. I also discussed with the patient that there may be a patient responsible charge related to this service. The patient expressed understanding and agreed to proceed. ?  ?THERAPIST PROGRESS NOTE ?  ?Session Time: 3:00 PM-3:30 PM ?  ?Participation Level: Active ?  ?Behavioral Response: CasualAlertDepressed ?  ?Type of Therapy: Individual Therapy ?  ?Treatment Goals addressed: Coping ?  ?Interventions: CBT ?  ?Summary: Lindsay Walsh is a 51 y.o. female who presents with Depression, ADHD, PTSD  . The OPT therapist worked with the patient for her ongoing OPT treatment session. The OPT therapist utilized Motivational Interviewing to assist in creating therapeutic repore. The patient in the session was engaged and work in collaboration giving feedback about her triggers and symptoms over the past few weeks. The patient spoke about being more active in the past few weeks with taking walks, going on the porch, socializing, and community outings.The patient spoke about her goal of joining the local [employer] and working out some to improve her physical health. The OPT therapist utilized Cognitive Behavioral Therapy through cognitive restructuring as well as worked with the patient on coping strategies to assist in management of mood. The OPT therapist promoted the patient continuing to work on her physical health and joining a local gym.  ?  ?Suicidal/Homicidal: Nowithout intent/plan ?  ?Therapist Response: The OPT therapist worked with the patient for the patients scheduled session. The patient was engaged in her  session and gave feedback in relation to triggers, symptoms, and behavior responses over the past few weeks. The OPT therapist worked with the patient utilizing an in session Cognitive Behavioral Therapy exercise. The patient was responsive in the session and verbalized," I have been really working on making changes and getting out of my house more and doing more walking and I have got feedback from family who say they are proud of me and that they can tell as difference". The OPT therapist worked with the patient doing her self check ins throughout the week.  The patient spoke about her commitment to change and do more for herself with her son moving out and her having more free time on her hands.The patient verbalized her goal still is to join the [employer].The OPT therapist will continue treatment work with the patient in her next scheduled session ?  ?Plan: Return again in 3 weeks. ?  ?Diagnosis       Axis I: Depressive Disorder unspecified, ADHD inattentive type, PTSD.   ?  ?                        Axis II: No diagnosis ?  ?  ?Collaboration of Care: No additional collaboration of care for this session. ?  ?Patient/Guardian was advised Release of Information must be obtained prior to any record release in order to collaborate their care with an outside provider. Patient/Guardian was advised if they have not already done so to contact the registration department to sign all necessary forms in order for [place] to release information regarding their care.  ?  ?Consent: Patient/Guardian gives  verbal consent for treatment and assignment of benefits for services provided during this visit. Patient/Guardian expressed understanding and agreed to proceed.  ?  ?I discussed the assessment and treatment plan with the patient. The patient was provided an opportunity to ask questions and all were answered. The patient agreed with the plan and demonstrated an understanding of the instructions. ?  ?The patient was advised to call  back or seek an in-person evaluation if the symptoms worsen or if the condition fails to improve as anticipated. ?  ?I provided 30 minutes of non-face-to-face time during this encounter. ?  ?Lennox Grumbles, LCSW ?  ?10/03/2021 ?

## 2021-10-12 ENCOUNTER — Other Ambulatory Visit (HOSPITAL_COMMUNITY): Payer: Self-pay | Admitting: Psychiatry

## 2021-10-12 DIAGNOSIS — F515 Nightmare disorder: Secondary | ICD-10-CM

## 2021-10-29 ENCOUNTER — Other Ambulatory Visit (INDEPENDENT_AMBULATORY_CARE_PROVIDER_SITE_OTHER): Payer: Self-pay | Admitting: Internal Medicine

## 2021-11-07 ENCOUNTER — Ambulatory Visit (INDEPENDENT_AMBULATORY_CARE_PROVIDER_SITE_OTHER): Payer: Medicare Other | Admitting: Clinical

## 2021-11-07 DIAGNOSIS — F32A Depression, unspecified: Secondary | ICD-10-CM | POA: Diagnosis not present

## 2021-11-07 DIAGNOSIS — F900 Attention-deficit hyperactivity disorder, predominantly inattentive type: Secondary | ICD-10-CM | POA: Diagnosis not present

## 2021-11-07 DIAGNOSIS — F4310 Post-traumatic stress disorder, unspecified: Secondary | ICD-10-CM

## 2021-11-07 NOTE — Progress Notes (Signed)
Virtual Visit via Telephone Note ?  ?I connected with Lindsay Walsh on 11/07/21 at  3:00 PM EST by telephone and verified that I am speaking with the correct person using two identifiers. ?  ?Location: ?Patient: Home ?Provider: Office ?  ?I discussed the limitations, risks, security and privacy concerns of performing an evaluation and management service by telephone and the availability of in person appointments. I also discussed with the patient that there may be a patient responsible charge related to this service. The patient expressed understanding and agreed to proceed. ?  ?THERAPIST PROGRESS NOTE ?  ?Session Time: 3:00 PM-3:30 PM ?  ?Participation Level: Active ?  ?Behavioral Response: CasualAlertDepressed ?  ?Type of Therapy: Individual Therapy ?  ?Treatment Goals addressed: Coping ?  ?Interventions: CBT ?  ?Summary: Lindsay Walsh is a 51 y.o. female who presents with Depression, ADHD, PTSD  . The OPT therapist worked with the patient for her ongoing OPT treatment session. The OPT therapist utilized Motivational Interviewing to assist in creating therapeutic repore. The patient in the session was engaged and work in collaboration giving feedback about her triggers and symptoms over the past few weeks. The patient spoke about difficulty in losing a friend of the family. The OPT therapist utilized Cognitive Behavioral Therapy through cognitive restructuring as well as worked with the patient on coping strategies to assist in management of mood. The OPT therapist promoted the patient continuing to work on her physical health and joining a local gym. The patient verbalized her goal is still to join the [employer] and to go with her daughter to improve her health. ?  ?Suicidal/Homicidal: Nowithout intent/plan ?  ?Therapist Response: The OPT therapist worked with the patient for the patients scheduled session. The patient was engaged in her session and gave feedback in relation to triggers, symptoms, and behavior  responses over the past few weeks. The OPT therapist worked with the patient utilizing an in session Cognitive Behavioral Therapy exercise. The patient was responsive in the session and verbalized," I was all set to join the gym and I was feeling good and then the everything happened, but its still my goal and I am going to try for May to join and go with my daughter". The OPT therapist worked with the patient doing her self check ins throughout the week.The patient verbalized her goal still is to join the [employer].The OPT therapist will continue treatment work with the patient in her next scheduled session ?  ?Plan: Return again in 3 weeks. ?  ?Diagnosis       Axis I: Depressive Disorder unspecified, ADHD inattentive type, PTSD.   ?  ?                        Axis II: No diagnosis ?  ?  ?Collaboration of Care: No additional collaboration of care for this session. ?  ?Patient/Guardian was advised Release of Information must be obtained prior to any record release in order to collaborate their care with an outside provider. Patient/Guardian was advised if they have not already done so to contact the registration department to sign all necessary forms in order for [place] to release information regarding their care.  ?  ?Consent: Patient/Guardian gives verbal consent for treatment and assignment of benefits for services provided during this visit. Patient/Guardian expressed understanding and agreed to proceed.  ?  ?I discussed the assessment and treatment plan with the patient. The patient was provided an opportunity  to ask questions and all were answered. The patient agreed with the plan and demonstrated an understanding of the instructions. ?  ?The patient was advised to call back or seek an in-person evaluation if the symptoms worsen or if the condition fails to improve as anticipated. ?  ?I provided 30 minutes of non-face-to-face time during this encounter. ?  ?Lennox Grumbles, LCSW ?  ?11/07/2021 ?

## 2021-11-09 ENCOUNTER — Encounter (HOSPITAL_COMMUNITY): Payer: Self-pay | Admitting: Psychiatry

## 2021-11-09 ENCOUNTER — Telehealth (INDEPENDENT_AMBULATORY_CARE_PROVIDER_SITE_OTHER): Payer: Medicare Other | Admitting: Psychiatry

## 2021-11-09 DIAGNOSIS — F900 Attention-deficit hyperactivity disorder, predominantly inattentive type: Secondary | ICD-10-CM

## 2021-11-09 DIAGNOSIS — F515 Nightmare disorder: Secondary | ICD-10-CM

## 2021-11-09 DIAGNOSIS — F4312 Post-traumatic stress disorder, chronic: Secondary | ICD-10-CM | POA: Diagnosis not present

## 2021-11-09 MED ORDER — PRAZOSIN HCL 2 MG PO CAPS: ORAL_CAPSULE | ORAL | 2 refills | Status: DC

## 2021-11-09 MED ORDER — AMPHETAMINE-DEXTROAMPHETAMINE 20 MG PO TABS: 20.0000 mg | ORAL_TABLET | Freq: Two times a day (BID) | ORAL | 0 refills | Status: DC

## 2021-11-09 MED ORDER — DULOXETINE HCL 60 MG PO CPEP: ORAL_CAPSULE | ORAL | 2 refills | Status: DC
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
## 2021-11-10 ENCOUNTER — Ambulatory Visit: Payer: Medicare Other | Admitting: Physician Assistant

## 2021-11-10 NOTE — Progress Notes (Signed)
? ?Office Visit Note ? ?Patient: Lindsay Walsh             ?Date of Birth: 01/14/1971           ?MRN: [id number]             ?PCP: Jani Gravel, MD ?Referring: Jani Gravel, MD ?Visit Date: 11/16/2021 ? ? ?Subjective:  ?Results (Abnormal lab results) ? ? ?History of Present Illness: Lindsay Walsh is a 51 y.o. female here for follow up for abnormal lab findings and continued ongoing joint pain in multiple areas.  After our last visit did not recommend any specific new anti-inflammatory medication.  She feels overall symptoms are doing worse compared to a year ago.  She has pain pretty much all over.  Some days she feels a sensitivity and pain other times feels like she has more numbness or diminished sensation throughout.  She is getting swelling in her hands and feet that is worse by the end of the day.  She keeps persistent fatigue usually regardless of her sleep quality.  Mixed constipation and diarrhea symptoms.  Repeat laboratory testing by her pain management provider concerning for worsening lab markers for connective tissue disease or inflammation.  Liver function test been remaining stable on Imuran. ? ?Previous HPI ?06/16/2020 ?Lindsay Walsh is a 51 y.o. female here for follow up with hand pain and sometimes all over pain, positive RNP antibodies here for repeat assessment for inflammatory joint pain, nailfold capillaroscopy, and positive RNP test. She continues to have pain in multiple places including her hands, knees, also neck and upper back. No signficant changes since her initial visit. ? ? ?Review of Systems  ?Constitutional:  Positive for fatigue.  ?HENT:  Positive for mouth dryness.   ?Eyes:  Positive for dryness.  ?Respiratory:  Positive for shortness of breath.   ?Cardiovascular:  Positive for swelling in legs/feet.  ?Gastrointestinal:  Positive for constipation and diarrhea.  ?Endocrine: Positive for heat intolerance, excessive thirst and increased urination.  ?Genitourinary:  Negative for  difficulty urinating.  ?Musculoskeletal:  Positive for joint pain, gait problem, joint pain, joint swelling, muscle weakness, morning stiffness and muscle tenderness.  ?Skin:  Positive for rash.  ?Allergic/Immunologic: Positive for susceptible to infections.  ?Neurological:  Positive for numbness and weakness.  ?Hematological:  Positive for bruising/bleeding tendency.  ?Psychiatric/Behavioral:  Positive for sleep disturbance.   ? ?PMFS History:  ?Patient Active Problem List  ? Diagnosis Date Noted  ? Positive ANA (antinuclear antibody) 11/16/2021  ? History of colonic polyps 11/15/2021  ? Bilateral hand pain 06/02/2020  ? OAB (overactive bladder) 11/05/2019  ? Recurrent UTI 11/05/2019  ? Diabetic gastroparesis ([place]) 08/21/2019  ? Nausea and vomiting 07/15/2019  ? Diarrhea 07/15/2019  ? Abnormal finding on urinalysis 07/15/2019  ? Morbid (severe) obesity due to excess calories ([place])   ? Essential hypertension   ? Major depressive disorder, single episode, unspecified   ? Restless legs syndrome   ? Sleep apnea   ? Fibromyalgia   ? Hereditary and idiopathic neuropathy, unspecified   ? Nonspecific mesenteric lymphadenitis   ? Pain in right foot   ? Type 2 diabetes mellitus with hyperglycemia (HCC)   ? Hypothyroidism 08/22/2018  ? DM type 2 causing vascular disease ([place]) 08/21/2018  ? Mixed hyperlipidemia 08/21/2018  ? Essential hypertension, benign 08/21/2018  ? Influenza A 08/10/2018  ? SIRS (systemic inflammatory response syndrome) ([place]) 08/10/2018  ? Essential hypertension, malignant 03/07/2015  ? Cerebral infarction ([place]) 03/05/2015  ?  Dyspareunia 05/06/2014  ? Obesity 09/30/2013  ? Bilateral hip pain 07/16/2013  ? Chronic pain syndrome 07/16/2013  ? Degenerative disc disease, lumbar 07/16/2013  ? Greater trochanteric bursitis of both hips 07/16/2013  ? Nightmares associated with chronic post-traumatic stress disorder 10/18/2012  ? Sprain of foot 03/14/2012  ? Weakness 01/29/2012  ? IBS (irritable bowel syndrome)  08/01/2011  ? Gastroesophageal reflux disease   ? Hypertension   ? Depression   ? Chest pain   ? Fasting hyperglycemia   ? TOBACCO ABUSE 04/28/2010  ? NARCOTIC ABUSE 04/28/2010  ? PULMONARY NODULE 04/28/2010  ? Autoimmune hepatitis ([place]) 04/28/2010  ? Myalgia and myositis 01/06/2009  ?  ?Past Medical History:  ?Diagnosis Date  ? Acid reflux   ? Allergic rhinitis due to pollen   ? Angina   ? took SL nitro one week ago  ? Anxiety   ? Arthritis   ? back  ? Arthritis   ? Autoimmune hepatitis ([place])   ? Back pain   ? Chest pain   ? Normal stress echo in 2011; PVCs; pedal edema  ? Connective tissue disease ([place])   ? CVA (cerebral infarction)   ? Depression   ? Depression   ? Diabetes mellitus without complication ([place])   ? Dizziness and giddiness   ? Dyspareunia 05/06/2014  ? Dysrhythmia   ? palpatations  ? Essential (primary) hypertension   ? Fasting hyperglycemia   ? Fibromyalgia   ? Fibromyalgia   ? Gastroesophageal reflux disease   ? Chronic abdominal pain; gastroparesis; globus hystericus; irritable bowel syndrome  ? Glaucoma   ? Hereditary and idiopathic neuropathy, unspecified   ? Hyperlipidemia   ? Hypertension   ? Impaired glucose tolerance   ? Irritable bowel syndrome   ? Major depressive disorder, single episode, unspecified   ? Morbid (severe) obesity due to excess calories ([place])   ? Narcotic dependence ([place])   ? Nausea   ? Nonspecific mesenteric lymphadenitis   ? Overweight(278.02)   ? Pain in limb   ? Pain in right foot   ? PTSD (post-traumatic stress disorder)   ? Pulmonary nodule   ? Restless legs syndrome   ? Shortness of breath   ? with exertion  ? Sleep apnea   ? Sleep apnea   ? on CPAP machine  ? Sleep apnea, unspecified   ? Stroke [facility]) 2016  ? Tobacco abuse   ? one pack per day; 35 pack years  ? Type 2 diabetes mellitus with hyperglycemia (HCC)   ?  ?Family History  ?Problem Relation Age of Onset  ? Diabetes Mother   ? Hypertension Mother   ? Anxiety disorder Mother   ? Depression Mother   ? Drug  abuse Mother   ? Heart disease Father   ? Diabetes Father   ? Anxiety disorder Father   ? Alcohol abuse Father   ? Depression Father   ? OCD Father   ? Thyroid disease Sister   ? Healthy Brother   ? Healthy Daughter   ? Healthy Son   ? ADD / ADHD Son   ? Alcohol abuse Paternal Grandfather   ? Depression Paternal Grandfather   ? Cancer Paternal Grandfather   ?     lung,skin  ? Tuberculosis Paternal Grandfather   ? Seizures Paternal Grandmother   ? Lupus Paternal Grandmother   ? ADD / ADHD Son   ? Anesthesia problems Neg Hx   ? Malignant hyperthermia Neg Hx   ?  Pseudochol deficiency Neg Hx   ? Hypotension Neg Hx   ? Bipolar disorder Neg Hx   ? Dementia Neg Hx   ? Paranoid behavior Neg Hx   ? Schizophrenia Neg Hx   ? Sexual abuse Neg Hx   ? Physical abuse Neg Hx   ? ?Past Surgical History:  ?Procedure Laterality Date  ? ABDOMINAL HYSTERECTOMY    ? TAH&BSO  ? BACK SURGERY    ? BLADDER SUSPENSION  09/12/2011  ? Procedure: TRANSVAGINAL TAPE (TVT) PROCEDURE;  Surgeon: [name], MD;  Location: [facility];  Service: Urology;  Laterality: N/A;  ? CESAREAN SECTION    ? X3  ? CHOLECYSTECTOMY    ? ESOPHAGOGASTRODUODENOSCOPY ENDOSCOPY    ? "throat stretched" per pt.  ? LIVER BIOPSY    ? x4  ? LUMBAR EPIDURAL INJECTION  01/2012  ? TOTAL ABDOMINAL HYSTERECTOMY W/ BILATERAL SALPINGOOPHORECTOMY    ? TUBAL LIGATION    ? Bilateral  ? UPPER GASTROINTESTINAL ENDOSCOPY  09/13/2010  ? ?Social History  ? ?Social History Narrative  ? Divorced since 1994.Lives with boyfriend of 11 years.On disability.  ? ?Immunization History  ?Administered Date(s) Administered  ? Influenza Inj Mdck Quad Pf 04/20/2016  ? Influenza Split 04/07/2013, 05/17/2017  ? Influenza,inj,Quad PF,6+ Mos 05/17/2017, 04/04/2018, 03/31/2019, 04/28/2019  ? Influenza-Unspecified 04/17/2012, 03/20/2014, 08/09/2014, 05/06/2015  ? Moderna Sars-Covid-2 Vaccination 02/06/2020, 03/05/2020  ? Pneumococcal Conjugate-13 12/18/2019  ? Pneumococcal Polysaccharide-23 04/20/2016  ?   ? ?Objective: ?Vital Signs: BP 123/85 (BP Location: Left Arm, Patient Position: Sitting, Cuff Size: Normal)   Pulse (!) 108   Resp 15   Ht 5' (1.524 m)   Wt 198 lb (89.8 kg)   BMI 38.67 kg/m?   ? ?Physical Exam

## 2021-11-15 ENCOUNTER — Encounter (INDEPENDENT_AMBULATORY_CARE_PROVIDER_SITE_OTHER): Payer: Self-pay | Admitting: Internal Medicine

## 2021-11-15 ENCOUNTER — Ambulatory Visit (INDEPENDENT_AMBULATORY_CARE_PROVIDER_SITE_OTHER): Payer: Medicare Other | Admitting: Internal Medicine

## 2021-11-15 VITALS — BP 134/88 | HR 97 | Temp 98.0°F | Ht 60.0 in | Wt 198.4 lb

## 2021-11-15 DIAGNOSIS — Z860100 Personal history of colon polyps, unspecified: Secondary | ICD-10-CM | POA: Insufficient documentation

## 2021-11-15 DIAGNOSIS — K219 Gastro-esophageal reflux disease without esophagitis: Secondary | ICD-10-CM | POA: Diagnosis not present

## 2021-11-15 DIAGNOSIS — K754 Autoimmune hepatitis: Secondary | ICD-10-CM | POA: Diagnosis not present

## 2021-11-15 DIAGNOSIS — K580 Irritable bowel syndrome with diarrhea: Secondary | ICD-10-CM

## 2021-11-15 DIAGNOSIS — E1143 Type 2 diabetes mellitus with diabetic autonomic (poly)neuropathy: Secondary | ICD-10-CM

## 2021-11-15 DIAGNOSIS — K3184 Gastroparesis: Secondary | ICD-10-CM

## 2021-11-15 MED ORDER — AZATHIOPRINE 50 MG PO TABS: 50.0000 mg | ORAL_TABLET | Freq: Every day | ORAL | 1 refills | Status: AC

## 2021-11-15 MED ORDER — DICYCLOMINE HCL 10 MG PO CAPS: 10.0000 mg | ORAL_CAPSULE | Freq: Two times a day (BID) | ORAL | 2 refills | Status: AC | PRN

## 2021-11-15 NOTE — Patient Instructions (Signed)
Your next colonoscopy is due in October 2024.  You can have this procedure at [facility] if okay with your acute physician. ?Take Motilium/domperidone 30 to 60 minutes before supper is at bedtime if necessary. ?

## 2021-11-15 NOTE — Progress Notes (Signed)
error 

## 2021-11-15 NOTE — Progress Notes (Signed)
Presenting complaint; ? ?Follow-up for GERD gastroparesis and IBS.  History of autoimmune hepatitis. ? ?Database and subjective: ? ?Lindsay Walsh is 51 year old Caucasian female with multiple medical problems who is here for scheduled visit.  She was last seen on 05/17/2021. ?She has history of autoimmune hepatitis which was diagnosed 19 years ago.  She has remained in remission on low-dose azathioprine.  Her last biopsy was at [facility] in 2010 revealing her to be in remission but she did have stage I-2 of 4. ?She also has a history of GERD gastroparesis and IBS. ?Lindsay Walsh shared extensive blood work that she had on 08/24/2020 at [facility].  She states she has been referred to a rheumatologist as there is concern that she may have another autoimmune process. ? ?She says she is doing well from GI standpoint.  She still has episodic nausea and vomiting.  It may occur once a week but not like it used to be.  She vomits food and liquids when this occurs.  No history of hematemesis or melena.  She feels heartburn is well controlled.  On most days she has 2-3 bowel movements.  Consistency varies between formed soft and loose.  She denies melena or rectal bleeding. ?She is not having any side effects with domperidone. ? ?Current Medications: ?Outpatient Encounter Medications as of 11/15/2021  ?Medication Sig  ? Accu-Chek Softclix Lancets lancets CHECK SUGAR ONCE DAILY  ? amphetamine-dextroamphetamine (ADDERALL) 20 MG tablet Take 1 tablet (20 mg total) by mouth 2 (two) times daily.  ? Ascorbic Acid (VITAMIN C) 1000 MG tablet Take 500 mg by mouth every morning.   ? aspirin 325 MG EC tablet Take 325 mg by mouth daily.  ? azaTHIOprine (IMURAN) 50 MG tablet TAKE 1 TABLET(50 MG) BY MOUTH DAILY  ? bimatoprost (LUMIGAN) 0.01 % SOLN Place 1 drop into both eyes at bedtime.  ? calcium-vitamin D (OSCAL WITH D) 500-200 MG-UNIT tablet Take 1 tablet by mouth 2 (two) times daily.  ? cycloSPORINE (RESTASIS) 0.05 % ophthalmic emulsion Place 1 drop  into both eyes daily.  ? dicyclomine (BENTYL) 10 MG capsule Take 1 capsule (10 mg total) by mouth 2 (two) times daily as needed.  ? Dulaglutide (TRULICITY) 3.55 HR/4.1UL SOPN Inject 0.75 mg into the skin once a week.  ? DULoxetine (CYMBALTA) 60 MG capsule TAKE 1 CAPSULE(60 MG) BY MOUTH TWICE DAILY  ? esomeprazole (NEXIUM) 40 MG capsule TAKE 1 CAPSULE(40 MG) BY MOUTH EVERY MORNING  ? fexofenadine (ALLEGRA) 60 MG tablet Take 1 tablet by mouth 2 (two) times daily as needed for allergies.   ? gabapentin (NEURONTIN) 300 MG capsule Take 300 mg by mouth at bedtime.  ? HYDROcodone-acetaminophen (NORCO/VICODIN) 5-325 MG tablet Take 1 tablet by mouth every 6 (six) hours as needed for moderate pain. 0.5 -1 po bid prn  ? lisinopril (ZESTRIL) 40 MG tablet TAKE 1 TABLET(40 MG) BY MOUTH DAILY  ? meclizine (ANTIVERT) 12.5 MG tablet Take 1 tablet (12.5 mg total) by mouth 3 (three) times daily as needed for dizziness.  ? metFORMIN (GLUCOPHAGE) 1000 MG tablet Take 1 tablet (1,000 mg total) by mouth 2 (two) times daily with a meal.  ? methocarbamol (ROBAXIN) 500 MG tablet Take 500 mg by mouth in the morning and at bedtime. Prn.  ? metoprolol succinate (TOPROL-XL) 100 MG 24 hr tablet TAKE 1 TABLET(100 MG) BY MOUTH DAILY WITH FOOD (Lindsay Walsh taking differently: Take 150 mg by mouth daily.)  ? mirabegron ER (MYRBETRIQ) 50 MG TB24 tablet Take 1 tablet (50 mg  total) by mouth daily.  ? Multiple Vitamins-Minerals (CENTRUM) tablet Take 1 tablet by mouth daily.  ? nitroGLYCERIN (NITROSTAT) 0.4 MG SL tablet as needed.  ? NP THYROID 60 MG tablet TAKE 1 TABLET(60 MG) BY MOUTH DAILY BEFORE BREAKFAST  ? ondansetron (ZOFRAN) 4 MG tablet Take 1 tablet (4 mg total) by mouth every 8 (eight) hours as needed for nausea or vomiting.  ? pramipexole (MIRAPEX) 1 MG tablet Take 1 mg by mouth daily.  ? prazosin (MINIPRESS) 2 MG capsule TAKE 1 CAPSULE(2 MG) BY MOUTH AT BEDTIME  ? PRESCRIPTION MEDICATION Take 1 tablet by mouth daily. Take 1 tablet by mouth daily  before food and at bedtime.  ? ?This is MOTILLIUM ( DOMPERIDONE 10 mg) Tablet  ? Probiotic Product (RESTORA PO) Take by mouth daily.   ? rosuvastatin (CRESTOR) 40 MG tablet Take 40 mg by mouth daily.  ? [DISCONTINUED] ibuprofen (ADVIL) 800 MG tablet as needed.  ? [DISCONTINUED] amphetamine-dextroamphetamine (ADDERALL) 20 MG tablet Take 1 tablet (20 mg total) by mouth 2 (two) times daily.  ? [DISCONTINUED] amphetamine-dextroamphetamine (ADDERALL) 20 MG tablet Take 1 tablet (20 mg total) by mouth 2 (two) times daily.  ? ?No facility-administered encounter medications on file as of 11/15/2021.  ? ? ? ?Objective: ?Blood pressure 134/88, pulse 97, temperature 98 ?F (36.7 ?C), temperature source Oral, height 5' (1.524 m), weight 198 lb 6.4 oz (90 kg). ?Lindsay Walsh is alert and in no acute distress. ?Conjunctiva is pink. Sclera is nonicteric ?Oropharyngeal mucosa is normal. ?No neck masses or thyromegaly noted. ?Cardiac exam with regular rhythm normal S1 and S2. No murmur or gallop noted. ?Lungs are clear to auscultation. ?Abdomen is full but soft and nontender with organomegaly or masses. ?No LE edema or clubbing noted. ?She has mild nonpitting pretibial edema. ? ?Labs/studies Results: ? ? ? ?  Latest Ref Rng & Units 01/25/2020  ?  8:25 PM 10/20/2019  ? 10:30 AM 07/06/2019  ? 10:35 AM  ?CBC  ?WBC 4.0 - 10.5 K/uL 11.8   9.0   12.7    ?Hemoglobin 12.0 - 15.0 g/dL 14.5   12.7   12.8    ?Hematocrit 36.0 - 46.0 % 43.2   37.8   37.8    ?Platelets 150 - 400 K/uL 316   300   258    ?  ? ?  Latest Ref Rng & Units 08/03/2020  ?  1:26 PM 01/25/2020  ?  8:25 PM 01/21/2020  ?  1:55 PM  ?CMP  ?Glucose 65 - 139 mg/dL 556   183   172    ?BUN 7 - 25 mg/dL 11   13   8     ?Creatinine 0.50 - 1.05 mg/dL 0.79   0.68   0.71    ?Sodium 135 - 146 mmol/L 132   139   140    ?Potassium 3.5 - 5.3 mmol/L 5.0   4.0   4.6    ?Chloride 98 - 110 mmol/L 96   103   101    ?CO2 20 - 32 mmol/L 23   25   28     ?Calcium 8.6 - 10.4 mg/dL 9.3   8.9   9.4    ?Total Protein  6.1 - 8.1 g/dL 6.7   8.2   6.4    ?Total Bilirubin 0.2 - 1.2 mg/dL 0.4   0.8   0.2    ?Alkaline Phos 38 - 126 U/L  107   108    ?AST 10 -  35 U/L 19   24   20     ?ALT 6 - 29 U/L 21   25   22     ?  ? ?  Latest Ref Rng & Units 08/03/2020  ?  1:26 PM 01/25/2020  ?  8:25 PM 01/21/2020  ?  1:55 PM  ?Hepatic Function  ?Total Protein 6.1 - 8.1 g/dL 6.7   8.2   6.4    ?Albumin 3.5 - 5.0 g/dL  4.4   4.0    ?AST 10 - 35 U/L 19   24   20     ?ALT 6 - 29 U/L 21   25   22     ?Alk Phosphatase 38 - 126 U/L  107   108    ?Total Bilirubin 0.2 - 1.2 mg/dL 0.4   0.8   0.2    ?  ?Lab data from 08/24/2021 provided by the Lindsay Walsh. ? ?WBC 10.8.  H&H 14.3 and 41.7, platelet count 371K ?ANA positive, RF negative. ?Serum sodium 142, potassium 4.9, chloride 99, CO2 23.4, glucose 131, BUN 15 and creatinine 0.8 ?Bilirubin 0.53, AP 105, AST 23, ALT 24, total protein 7.5 and albumin 4.4. ?Serum calcium 9.3. ? ?Hemoglobin A1c 7.2. ?TSH 4.158. ?Vitamin D 2 40-4 7 ?CRP 4(normal up to 10) ?HCV antibody negative ?Hepatitis B surface antigen negative ? ?Anti-DNA antibody normal ?RNP antibody within normal limits ?Smith antibody negative ?Anti-SSA and anti-SSB negative. ? ? ?Assessment: ? ?#1.  Autoimmune hepatitis.  She was diagnosed with autoimmune hepatitis in February 2004 while liver biopsy when she presented with bilirubin of 20 mg and transaminases in 2000 range.  She has remained in remission all these years.  She did have liver biopsy at [facility] in March 2010 which revealed resolution of lobular hepatitis and she had periportal fibrosis spurs(stage I-2 of 4). ?She is presently on low-dose azathioprine.  Blood work from February 2023 revealed normal AST and ALT as above. ?Lindsay Walsh is worried that she may have another autoimmune process.  Positive ANA antibody can be easily explained the basis of autoimmune hepatitis. ? ?#2.  Gastroparesis.  She is doing well with dietary measures and domperidone. ? ?#3.  IBS.  She is doing well with as needed use of  dicyclomine. ? ?#4.  History of tubular adenoma.  She will be due for next colonoscopy in October 2024.  She may choose to have exam locally if she wants to.  Last colonoscopy was at [facility] in April 28, 2018.

## 2021-11-16 ENCOUNTER — Encounter: Payer: Self-pay | Admitting: Internal Medicine

## 2021-11-16 ENCOUNTER — Ambulatory Visit (INDEPENDENT_AMBULATORY_CARE_PROVIDER_SITE_OTHER): Payer: Medicare Other | Admitting: Internal Medicine

## 2021-11-16 VITALS — BP 123/85 | HR 108 | Resp 15 | Ht 60.0 in | Wt 198.0 lb

## 2021-11-16 DIAGNOSIS — K754 Autoimmune hepatitis: Secondary | ICD-10-CM

## 2021-11-16 DIAGNOSIS — M797 Fibromyalgia: Secondary | ICD-10-CM | POA: Diagnosis not present

## 2021-11-16 DIAGNOSIS — R768 Other specified abnormal immunological findings in serum: Secondary | ICD-10-CM | POA: Diagnosis not present

## 2021-11-16 DIAGNOSIS — M79642 Pain in left hand: Secondary | ICD-10-CM

## 2021-11-16 DIAGNOSIS — M79641 Pain in right hand: Secondary | ICD-10-CM | POA: Diagnosis not present

## 2021-11-17 LAB — RNP ANTIBODY: Ribonucleic Protein(ENA) Antibody, IgG: 4.5 AI — AB

## 2021-11-17 LAB — C-REACTIVE PROTEIN: CRP: 5.1 mg/L (ref <8.0)

## 2021-11-17 LAB — SEDIMENTATION RATE: Sed Rate: 9 mm/h (ref 0–30)

## 2021-11-21 NOTE — Progress Notes (Signed)
Lab result shows increased RNP antibody titer inflammatory markers are normal. I recommend we can try starting the hydroxychloroquine 200 mg daily for arthritis like discussed in clinic if she is okay with that. ?Would need to follow up in 2-3 months to recheck symptoms and medication monitoring.

## 2021-11-22 MED ORDER — HYDROXYCHLOROQUINE SULFATE 200 MG PO TABS: 200.0000 mg | ORAL_TABLET | Freq: Every day | ORAL | 2 refills | Status: DC

## 2021-11-22 NOTE — Addendum Note (Signed)
Addended by: Collier Salina on: 11/22/2021 12:06 PM ? ? Modules accepted: Orders ? ?

## 2021-12-12 ENCOUNTER — Ambulatory Visit (INDEPENDENT_AMBULATORY_CARE_PROVIDER_SITE_OTHER): Payer: Medicare Other | Admitting: Clinical

## 2021-12-12 DIAGNOSIS — F900 Attention-deficit hyperactivity disorder, predominantly inattentive type: Secondary | ICD-10-CM

## 2021-12-12 DIAGNOSIS — F4310 Post-traumatic stress disorder, unspecified: Secondary | ICD-10-CM | POA: Diagnosis not present

## 2021-12-12 DIAGNOSIS — F32A Depression, unspecified: Secondary | ICD-10-CM

## 2021-12-12 NOTE — Progress Notes (Signed)
Virtual Visit via Telephone Note   I connected with Lindsay Walsh on 12/12/2021 at  3:00 PM EST by telephone and verified that I am speaking with the correct person using two identifiers.   Location: Patient: Home Provider: Office   I discussed the limitations, risks, security and privacy concerns of performing an evaluation and management service by telephone and the availability of in person appointments. I also discussed with the patient that there may be a patient responsible charge related to this service. The patient expressed understanding and agreed to proceed.   THERAPIST PROGRESS NOTE   Session Time: 3:00 PM-3:30 PM   Participation Level: Active   Behavioral Response: CasualAlertDepressed   Type of Therapy: Individual Therapy   Treatment Goals addressed: Coping   Interventions: CBT   Summary: Merlinda Wrubel. [name] is a 51 y.o. female who presents with Depression, ADHD, PTSD  . The OPT therapist worked with the patient for her ongoing OPT treatment session. The OPT therapist utilized Motivational Interviewing to assist in creating therapeutic repore. The patient in the session was engaged and work in collaboration giving feedback about her triggers and symptoms over the past few weeks. The patient spoke about difficulty in losing her aunt who recently passed away and the extra involvement she has had in being a support for other family members who are going through the grieving process. The OPT therapist utilized Cognitive Behavioral Therapy through cognitive restructuring as well as worked with the patient on coping strategies to assist in management of mood. The OPT therapist promoted the patient continuing to work on her physical health and joining a local gym. The patient verbalized her goal is still to join the [employer] and to go with her daughter to improve her health, however, with recent events she has been off track, however, verbalized her intent to get back on track over the  course of this week.   Suicidal/Homicidal: Nowithout intent/plan   Therapist Response: The OPT therapist worked with the patient for the patients scheduled session. The patient was engaged in her session and gave feedback in relation to triggers, symptoms, and behavior responses over the past few weeks. The OPT therapist worked with the patient utilizing an in session Cognitive Behavioral Therapy exercise. The patient was responsive in the session and verbalized," It has been emotionally and mentally draining with losing my aunt and my involvement in supporting my family members... I am going to try this week to get to the gym that is something I want to do for my health". The OPT therapist worked with the patient doing her self check ins throughout the week.The OPT therapist will continue treatment work with the patient in her next scheduled session   Plan: Return again in 3 weeks.   Diagnosis       Axis I: Depressive Disorder unspecified, ADHD inattentive type, PTSD.                             Axis II: No diagnosis     Collaboration of Care: No additional collaboration of care for this session.   Patient/Guardian was advised Release of Information must be obtained prior to any record release in order to collaborate their care with an outside provider. Patient/Guardian was advised if they have not already done so to contact the registration department to sign all necessary forms in order for [place] to release information regarding their care.    Consent: Patient/Guardian gives verbal  consent for treatment and assignment of benefits for services provided during this visit. Patient/Guardian expressed understanding and agreed to proceed.    I discussed the assessment and treatment plan with the patient. The patient was provided an opportunity to ask questions and all were answered. The patient agreed with the plan and demonstrated an understanding of the instructions.   The patient was advised to  call back or seek an in-person evaluation if the symptoms worsen or if the condition fails to improve as anticipated.   I provided 30 minutes of non-face-to-face time during this encounter.   Lennox Grumbles, LCSW   12/12/2021

## 2021-12-26 ENCOUNTER — Ambulatory Visit (HOSPITAL_COMMUNITY): Payer: Medicare Other | Admitting: Clinical

## 2021-12-26 ENCOUNTER — Telehealth (HOSPITAL_COMMUNITY): Payer: Self-pay | Admitting: Clinical

## 2021-12-26 NOTE — Telephone Encounter (Signed)
The patient did not respond to contact attempts.  

## 2021-12-28 ENCOUNTER — Other Ambulatory Visit: Payer: Self-pay | Admitting: Adult Medicine

## 2021-12-28 DIAGNOSIS — Z1231 Encounter for screening mammogram for malignant neoplasm of breast: Secondary | ICD-10-CM

## 2022-01-05 ENCOUNTER — Ambulatory Visit
Admission: RE | Admit: 2022-01-05 | Discharge: 2022-01-05 | Disposition: A | Discharge status: Home / Self Care | Payer: Medicare Other | Source: Ambulatory Visit | Attending: Adult Medicine | Admitting: Adult Medicine

## 2022-01-05 DIAGNOSIS — Z1231 Encounter for screening mammogram for malignant neoplasm of breast: Secondary | ICD-10-CM

## 2022-01-14 ENCOUNTER — Other Ambulatory Visit (HOSPITAL_COMMUNITY): Payer: Self-pay | Admitting: Psychiatry

## 2022-02-08 ENCOUNTER — Ambulatory Visit (HOSPITAL_COMMUNITY): Payer: Medicare Other | Admitting: Clinical

## 2022-02-08 ENCOUNTER — Encounter (HOSPITAL_COMMUNITY): Payer: Self-pay | Admitting: Psychiatry

## 2022-02-08 ENCOUNTER — Telehealth (INDEPENDENT_AMBULATORY_CARE_PROVIDER_SITE_OTHER): Payer: Medicare Other | Admitting: Psychiatry

## 2022-02-08 ENCOUNTER — Ambulatory Visit: Payer: Medicare Other | Admitting: Physician Assistant

## 2022-02-08 DIAGNOSIS — F32A Depression, unspecified: Secondary | ICD-10-CM | POA: Diagnosis not present

## 2022-02-08 DIAGNOSIS — F515 Nightmare disorder: Secondary | ICD-10-CM

## 2022-02-08 DIAGNOSIS — F4310 Post-traumatic stress disorder, unspecified: Secondary | ICD-10-CM | POA: Diagnosis not present

## 2022-02-08 DIAGNOSIS — F4312 Post-traumatic stress disorder, chronic: Secondary | ICD-10-CM

## 2022-02-08 MED ORDER — AMPHETAMINE-DEXTROAMPHETAMINE 20 MG PO TABS: 20.0000 mg | ORAL_TABLET | Freq: Two times a day (BID) | ORAL | 0 refills | Status: DC

## 2022-02-08 MED ORDER — DULOXETINE HCL 60 MG PO CPEP: ORAL_CAPSULE | ORAL | 2 refills | Status: DC

## 2022-02-08 MED ORDER — VORTIOXETINE HBR 10 MG PO TABS
10.0000 mg | ORAL_TABLET | Freq: Every day | ORAL | 2 refills | Status: DC
Start: 2022-02-08 — End: 2022-04-21

## 2022-02-08 MED ORDER — PRAZOSIN HCL 2 MG PO CAPS
ORAL_CAPSULE | ORAL | 2 refills | Status: DC
Start: 2022-02-08 — End: 2022-05-16

## 2022-02-08 NOTE — Progress Notes (Deleted)
Office Visit Note  Patient: Lindsay Walsh             Date of Birth: 07-24-1970           MRN: [id number]             PCP: Jani Gravel, MD Referring: Jani Gravel, MD Visit Date: 02/20/2022   Subjective:  No chief complaint on file.   History of Present Illness: Lindsay Walsh is a 51 y.o. female here for follow up follow up for abnormal lab findings and continued ongoing joint pain in multiple areas.    Previous HPI 11/16/2021 Lindsay Walsh is a 51 y.o. female here for follow up for abnormal lab findings and continued ongoing joint pain in multiple areas.  After our last visit did not recommend any specific new anti-inflammatory medication.  She feels overall symptoms are doing worse compared to a year ago.  She has pain pretty much all over.  Some days she feels a sensitivity and pain other times feels like she has more numbness or diminished sensation throughout.  She is getting swelling in her hands and feet that is worse by the end of the day.  She keeps persistent fatigue usually regardless of her sleep quality.  Mixed constipation and diarrhea symptoms.  Repeat laboratory testing by her pain management provider concerning for worsening lab markers for connective tissue disease or inflammation.  Liver function test been remaining stable on Imuran.   Previous HPI 06/16/2020 Lindsay Walsh is a 51 y.o. female here for follow up with hand pain and sometimes all over pain, positive RNP antibodies here for repeat assessment for inflammatory joint pain, nailfold capillaroscopy, and positive RNP test. She continues to have pain in multiple places including her hands, knees, also neck and upper back. No signficant changes since her initial visit.     No Rheumatology ROS completed.   PMFS History:  Patient Active Problem List   Diagnosis Date Noted   Positive ANA (antinuclear antibody) 11/16/2021   History of colonic polyps 11/15/2021   Bilateral hand pain 06/02/2020   OAB  (overactive bladder) 11/05/2019   Recurrent UTI 11/05/2019   Diabetic gastroparesis ([place]) 08/21/2019   Nausea and vomiting 07/15/2019   Diarrhea 07/15/2019   Abnormal finding on urinalysis 07/15/2019   Morbid (severe) obesity due to excess calories (HCC)    Essential hypertension    Major depressive disorder, single episode, unspecified    Restless legs syndrome    Sleep apnea    Fibromyalgia    Hereditary and idiopathic neuropathy, unspecified    Nonspecific mesenteric lymphadenitis    Pain in right foot    Type 2 diabetes mellitus with hyperglycemia ([place])    Hypothyroidism 08/22/2018   DM type 2 causing vascular disease ([place]) 08/21/2018   Mixed hyperlipidemia 08/21/2018   Essential hypertension, benign 08/21/2018   Influenza A 08/10/2018   SIRS (systemic inflammatory response syndrome) ([place]) 08/10/2018   Essential hypertension, malignant 03/07/2015   Cerebral infarction ([place]) 03/05/2015   Dyspareunia 05/06/2014   Obesity 09/30/2013   Bilateral hip pain 07/16/2013   Chronic pain syndrome 07/16/2013   Degenerative disc disease, lumbar 07/16/2013   Greater trochanteric bursitis of both hips 07/16/2013   Nightmares associated with chronic post-traumatic stress disorder 10/18/2012   Sprain of foot 03/14/2012   Weakness 01/29/2012   IBS (irritable bowel syndrome) 08/01/2011   Gastroesophageal reflux disease    Hypertension    Depression    Chest pain  Fasting hyperglycemia    TOBACCO ABUSE 04/28/2010   NARCOTIC ABUSE 04/28/2010   PULMONARY NODULE 04/28/2010   Autoimmune hepatitis ([place]) 04/28/2010   Myalgia and myositis 01/06/2009    Past Medical History:  Diagnosis Date   Acid reflux    Allergic rhinitis due to pollen    Angina    took SL nitro one week ago   Anxiety    Arthritis    back   Arthritis    Autoimmune hepatitis ([place])    Back pain    Chest pain    Normal stress echo in 2011; PVCs; pedal edema   Connective tissue disease ([place])    CVA (cerebral  infarction)    Depression    Depression    Diabetes mellitus without complication (HCC)    Dizziness and giddiness    Dyspareunia 05/06/2014   Dysrhythmia    palpatations   Essential (primary) hypertension    Fasting hyperglycemia    Fibromyalgia    Fibromyalgia    Gastroesophageal reflux disease    Chronic abdominal pain; gastroparesis; globus hystericus; irritable bowel syndrome   Glaucoma    Hereditary and idiopathic neuropathy, unspecified    Hyperlipidemia    Hypertension    Impaired glucose tolerance    Irritable bowel syndrome    Major depressive disorder, single episode, unspecified    Morbid (severe) obesity due to excess calories (HCC)    Narcotic dependence (HCC)    Nausea    Nonspecific mesenteric lymphadenitis    Overweight(278.02)    Pain in limb    Pain in right foot    PTSD (post-traumatic stress disorder)    Pulmonary nodule    Restless legs syndrome    Shortness of breath    with exertion   Sleep apnea    Sleep apnea    on CPAP machine   Sleep apnea, unspecified    Stroke ([place]) 2016   Tobacco abuse    one pack per day; 35 pack years   Type 2 diabetes mellitus with hyperglycemia ([place])     Family History  Problem Relation Age of Onset   Diabetes Mother    Hypertension Mother    Anxiety disorder Mother    Depression Mother    Drug abuse Mother    Heart disease Father    Diabetes Father    Anxiety disorder Father    Alcohol abuse Father    Depression Father    OCD Father    Thyroid disease Sister    Healthy Brother    Healthy Daughter    Healthy Son    ADD / ADHD Son    Alcohol abuse Paternal Grandfather    Depression Paternal Grandfather    Cancer Paternal Grandfather        lung,skin   Tuberculosis Paternal Grandfather    Seizures Paternal Grandmother    Lupus Paternal Grandmother    ADD / ADHD Son    Anesthesia problems Neg Hx    Malignant hyperthermia Neg Hx    Pseudochol deficiency Neg Hx    Hypotension Neg Hx    Bipolar  disorder Neg Hx    Dementia Neg Hx    Paranoid behavior Neg Hx    Schizophrenia Neg Hx    Sexual abuse Neg Hx    Physical abuse Neg Hx    Past Surgical History:  Procedure Laterality Date   ABDOMINAL HYSTERECTOMY     TAH&BSO   BACK SURGERY     BLADDER SUSPENSION  09/12/2011  Procedure: TRANSVAGINAL TAPE (TVT) PROCEDURE;  Surgeon: [name], MD;  Location: [facility];  Service: Urology;  Laterality: N/A;   CESAREAN SECTION     X3   CHOLECYSTECTOMY     ESOPHAGOGASTRODUODENOSCOPY ENDOSCOPY     "throat stretched" per pt.   LIVER BIOPSY     x4   LUMBAR EPIDURAL INJECTION  01/2012   TOTAL ABDOMINAL HYSTERECTOMY W/ BILATERAL SALPINGOOPHORECTOMY     TUBAL LIGATION     Bilateral   UPPER GASTROINTESTINAL ENDOSCOPY  09/13/2010   Social History   Social History Narrative   Divorced since 1994.Lives with boyfriend of 11 years.On disability.   Immunization History  Administered Date(s) Administered   Influenza Inj Mdck Quad Pf 04/20/2016   Influenza Split 04/07/2013, 05/17/2017   Influenza,inj,Quad PF,6+ Mos 05/17/2017, 04/04/2018, 03/31/2019, 04/28/2019   Influenza-Unspecified 04/17/2012, 03/20/2014, 08/09/2014, 05/06/2015   Moderna Sars-Covid-2 Vaccination 02/06/2020, 03/05/2020   Pneumococcal Conjugate-13 12/18/2019   Pneumococcal Polysaccharide-23 04/20/2016     Objective: Vital Signs: There were no vitals taken for this visit.   Physical Exam   Musculoskeletal Exam: ***  CDAI Exam: CDAI Score: -- Patient Global: --; Provider Global: -- Swollen: --; Tender: -- Joint Exam 02/20/2022   No joint exam has been documented for this visit   There is currently no information documented on the homunculus. Go to the Rheumatology activity and complete the homunculus joint exam.  Investigation: No additional findings.  Imaging: No results found.  Recent Labs: Lab Results  Component Value Date   WBC 11.8 (H) 01/25/2020   HGB 14.5 01/25/2020   PLT 316 01/25/2020    NA 132 (L) 08/03/2020   K 5.0 08/03/2020   CL 96 (L) 08/03/2020   CO2 23 08/03/2020   GLUCOSE 556 (HH) 08/03/2020   BUN 11 08/03/2020   CREATININE 0.79 08/03/2020   BILITOT 0.4 08/03/2020   ALKPHOS 107 01/25/2020   AST 19 08/03/2020   ALT 21 08/03/2020   PROT 6.7 08/03/2020   ALBUMIN 4.4 01/25/2020   CALCIUM 9.3 08/03/2020   GFRAA 101 08/03/2020    Speciality Comments: No specialty comments available.  Procedures:  No procedures performed Allergies: Doxycycline, Fentanyl, Amlodipine, Bupropion, Cefoxitin, Ceftin [cefuroxime axetil], Iodinated contrast media, Iohexol, Norvasc [amlodipine besylate], Wellbutrin [bupropion hcl], Latex, and Tape   Assessment / Plan:     Visit Diagnoses: No diagnosis found.  ***  Orders: No orders of the defined types were placed in this encounter.  No orders of the defined types were placed in this encounter.    Follow-Up Instructions: No follow-ups on file.   Bertram Savin, RT  Note - This record has been created using Editor, commissioning.  Chart creation errors have been sought, but may not always  have been located. Such creation errors do not reflect on  the standard of medical care.

## 2022-02-08 NOTE — Progress Notes (Signed)
Virtual Visit via Telephone Note  I connected with Lindsay Walsh on 02/08/22 at  2:00 PM EDT by telephone and verified that I am speaking with the correct person using two identifiers.  Location: Patient: home Provider: home office   I discussed the limitations, risks, security and privacy concerns of performing an evaluation and management service by telephone and the availability of in person appointments. I also discussed with the patient that there may be a patient responsible charge related to this service. The patient expressed understanding and agreed to proceed.      I discussed the assessment and treatment plan with the patient. The patient was provided an opportunity to ask questions and all were answered. The patient agreed with the plan and demonstrated an understanding of the instructions.   The patient was advised to call back or seek an in-person evaluation if the symptoms worsen or if the condition fails to improve as anticipated.  I provided 20 minutes of non-face-to-face time during this encounter.   [name], MD  [facility] MD/PA/NP OP Progress Note  02/08/2022 2:18 PM Lindsay Walsh  MRN:  [id number]  Chief Complaint:  Chief Complaint  Patient presents with   Anxiety   Depression   Follow-up   HPI: This patient is a 51 year old divorced white female who lives with her boyfriend in [place]. She is on disability for an autoimmune liver disease. She has 3 children and one granddaughter  Returns for follow-up after 3 months.  She has been having a lot more health issues.  She has been having chest pain and palpitations and is being followed by cardiology at [facility].  She has been doing a lot of testing and nothing is conclusive yet.  She was also diagnosed with lupus that she has elevated ANA titers.  She is now on Plaquenil which is helped some of her arthritic symptoms.  She states she is getting more depressed because she feels there is more and  more limitations on her life due to her health issues.  A lot of times she does not have very good energy or motivation to do much.  Her sleep is still not that great and she has not been taking the prazosin lately and I urged her to take it again.  She asked if we can add something to the Cymbalta.  She has had bad reactions to Wellbutrin and adding in similar SSRI would not make sense so we can try a low-dose of Trintellix.  She does think the Adderall is continue to help with her focus and alertness. Visit Diagnosis:    ICD-10-CM   1. PTSD (post-traumatic stress disorder)  F43.10     2. Nightmares associated with chronic post-traumatic stress disorder  F51.5 prazosin (MINIPRESS) 2 MG capsule   F43.12     3. Depression, unspecified depression type  F32.A       Past Psychiatric History: Hospitalized in her early 2s for depression  Past Medical History:  Past Medical History:  Diagnosis Date   Acid reflux    Allergic rhinitis due to pollen    Angina    took SL nitro one week ago   Anxiety    Arthritis    back   Arthritis    Autoimmune hepatitis ([place])    Back pain    Chest pain    Normal stress echo in 2011; PVCs; pedal edema   Connective tissue disease ([place])    CVA (cerebral infarction)  Depression    Depression    Diabetes mellitus without complication ([place])    Dizziness and giddiness    Dyspareunia 05/06/2014   Dysrhythmia    palpatations   Essential (primary) hypertension    Fasting hyperglycemia    Fibromyalgia    Fibromyalgia    Gastroesophageal reflux disease    Chronic abdominal pain; gastroparesis; globus hystericus; irritable bowel syndrome   Glaucoma    Hereditary and idiopathic neuropathy, unspecified    Hyperlipidemia    Hypertension    Impaired glucose tolerance    Irritable bowel syndrome    Major depressive disorder, single episode, unspecified    Morbid (severe) obesity due to excess calories (HCC)    Narcotic dependence (HCC)    Nausea     Nonspecific mesenteric lymphadenitis    Overweight(278.02)    Pain in limb    Pain in right foot    PTSD (post-traumatic stress disorder)    Pulmonary nodule    Restless legs syndrome    Shortness of breath    with exertion   Sleep apnea    Sleep apnea    on CPAP machine   Sleep apnea, unspecified    Stroke ([place]) 2016   Tobacco abuse    one pack per day; 35 pack years   Type 2 diabetes mellitus with hyperglycemia ([place])     Past Surgical History:  Procedure Laterality Date   ABDOMINAL HYSTERECTOMY     TAH&BSO   BACK SURGERY     BLADDER SUSPENSION  09/12/2011   Procedure: TRANSVAGINAL TAPE (TVT) PROCEDURE;  Surgeon: [name], MD;  Location: [facility];  Service: Urology;  Laterality: N/A;   CESAREAN SECTION     X3   CHOLECYSTECTOMY     ESOPHAGOGASTRODUODENOSCOPY ENDOSCOPY     "throat stretched" per pt.   LIVER BIOPSY     x4   LUMBAR EPIDURAL INJECTION  01/2012   TOTAL ABDOMINAL HYSTERECTOMY W/ BILATERAL SALPINGOOPHORECTOMY     TUBAL LIGATION     Bilateral   UPPER GASTROINTESTINAL ENDOSCOPY  09/13/2010    Family Psychiatric History: see below  Family History:  Family History  Problem Relation Age of Onset   Diabetes Mother    Hypertension Mother    Anxiety disorder Mother    Depression Mother    Drug abuse Mother    Heart disease Father    Diabetes Father    Anxiety disorder Father    Alcohol abuse Father    Depression Father    OCD Father    Thyroid disease Sister    Healthy Brother    Healthy Daughter    Healthy Son    ADD / ADHD Son    Alcohol abuse Paternal Grandfather    Depression Paternal Grandfather    Cancer Paternal Grandfather        lung,skin   Tuberculosis Paternal Grandfather    Seizures Paternal Grandmother    Lupus Paternal Grandmother    ADD / ADHD Son    Anesthesia problems Neg Hx    Malignant hyperthermia Neg Hx    Pseudochol deficiency Neg Hx    Hypotension Neg Hx    Bipolar disorder Neg Hx    Dementia Neg Hx    Paranoid  behavior Neg Hx    Schizophrenia Neg Hx    Sexual abuse Neg Hx    Physical abuse Neg Hx     Social History:  Social History   Socioeconomic History   Marital status: Divorced  Spouse name: Not on file   Number of children: 3   Years of education: GED   Highest education level: Not on file  Occupational History    Employer: NOT EMPLOYED  Tobacco Use   Smoking status: Former    Packs/day: 1.50    Years: 25.00    Total pack years: 37.50    Types: Cigarettes    Quit date: 04/25/2013    Years since quitting: 8.7   Smokeless tobacco: Never  Vaping Use   Vaping Use: Some days  Substance and Sexual Activity   Alcohol use: No   Drug use: No   Sexual activity: Yes    Birth control/protection: Surgical, Abstinence  Other Topics Concern   Not on file  Social History Narrative   Divorced since 1994.Lives with boyfriend of 11 years.On disability.   Social Determinants of Health   Financial Resource Strain: Not on file  Food Insecurity: Not on file  Transportation Needs: Not on file  Physical Activity: Not on file  Stress: Not on file  Social Connections: Not on file    Allergies:  Allergies  Allergen Reactions   Doxycycline Shortness Of Breath and Rash   Fentanyl Shortness Of Breath, Itching and Other (See Comments)    Heart racing, SOB   Amlodipine     Unknown reaction   Bupropion     Unknown reaction   Cefoxitin     Unknown reaction   Ceftin [Cefuroxime Axetil]     Unknown reaction   Iodinated Contrast Media Other (See Comments)    Knots on body   Iohexol Other (See Comments)    Knots on body    Norvasc [Amlodipine Besylate]    Wellbutrin [Bupropion Hcl] Other (See Comments)    unknown   Latex Rash   Tape Rash    Metabolic Disorder Labs: Lab Results  Component Value Date   HGBA1C 11.4 (H) 08/03/2020   MPG 280 08/03/2020   MPG 237.43 08/11/2018   No results found for: "PROLACTIN" Lab Results  Component Value Date   CHOL 153 04/25/2019   TRIG  141 04/25/2019   HDL 37 04/25/2019   CHOLHDL 4.9 03/06/2015   VLDL 16 03/06/2015   LDLCALC 91 04/25/2019   LDLCALC 104 (H) 03/06/2015   Lab Results  Component Value Date   TSH 2.79 08/03/2020   TSH 1.550 01/21/2020    Therapeutic Level Labs: No results found for: "LITHIUM" No results found for: "VALPROATE" No results found for: "CBMZ"  Current Medications: Current Outpatient Medications  Medication Sig Dispense Refill   vortioxetine HBr (TRINTELLIX) 10 MG TABS tablet Take 1 tablet (10 mg total) by mouth daily. 30 tablet 2   ACCU-CHEK GUIDE test strip USE TO CHECK BLOOD SUGAR FOUR TIMES DAILY     Accu-Chek Softclix Lancets lancets CHECK SUGAR ONCE DAILY     amphetamine-dextroamphetamine (ADDERALL) 20 MG tablet Take 1 tablet (20 mg total) by mouth 2 (two) times daily. 60 tablet 0   Ascorbic Acid (VITAMIN C) 1000 MG tablet Take 500 mg by mouth every morning.      aspirin 325 MG EC tablet Take 325 mg by mouth daily.     azaTHIOprine (IMURAN) 50 MG tablet Take 1 tablet (50 mg total) by mouth daily. 90 tablet 1   bimatoprost (LUMIGAN) 0.01 % SOLN Place 1 drop into both eyes at bedtime.     calcium-vitamin D (OSCAL WITH D) 500-200 MG-UNIT tablet Take 1 tablet by mouth 2 (two) times daily.  cycloSPORINE (RESTASIS) 0.05 % ophthalmic emulsion Place 1 drop into both eyes daily.     dicyclomine (BENTYL) 10 MG capsule Take 1 capsule (10 mg total) by mouth 2 (two) times daily as needed. 60 capsule 2   Dulaglutide (TRULICITY) 7.26 OM/3.5DH SOPN Inject 0.75 mg into the skin once a week. 2 mL 3   DULoxetine (CYMBALTA) 60 MG capsule TAKE 1 CAPSULE(60 MG) BY MOUTH TWICE DAILY 60 capsule 2   esomeprazole (NEXIUM) 40 MG capsule TAKE 1 CAPSULE(40 MG) BY MOUTH EVERY MORNING 90 capsule 1   FARXIGA 10 MG TABS tablet Take 10 mg by mouth daily.     fexofenadine (ALLEGRA) 60 MG tablet Take 1 tablet by mouth 2 (two) times daily as needed for allergies.      gabapentin (NEURONTIN) 300 MG capsule Take 300  mg by mouth at bedtime.     Garlic 7416 MG CAPS Take by mouth.     HYDROcodone-acetaminophen (NORCO/VICODIN) 5-325 MG tablet Take 1 tablet by mouth every 6 (six) hours as needed for moderate pain. 0.5 -1 po bid prn     hydroxychloroquine (PLAQUENIL) 200 MG tablet Take 1 tablet (200 mg total) by mouth daily. 30 tablet 2   lisinopril (ZESTRIL) 40 MG tablet TAKE 1 TABLET(40 MG) BY MOUTH DAILY 90 tablet 0   meclizine (ANTIVERT) 12.5 MG tablet Take 1 tablet (12.5 mg total) by mouth 3 (three) times daily as needed for dizziness. 30 tablet 0   metFORMIN (GLUCOPHAGE) 1000 MG tablet Take 1 tablet (1,000 mg total) by mouth 2 (two) times daily with a meal. 60 tablet 3   methocarbamol (ROBAXIN) 500 MG tablet Take 500 mg by mouth in the morning and at bedtime. Prn.     metoprolol succinate (TOPROL-XL) 100 MG 24 hr tablet TAKE 1 TABLET(100 MG) BY MOUTH DAILY WITH FOOD (Patient taking differently: Take 150 mg by mouth daily.) 90 tablet 3   mirabegron ER (MYRBETRIQ) 50 MG TB24 tablet Take 1 tablet (50 mg total) by mouth daily. 30 tablet 11   Multiple Vitamins-Minerals (CENTRUM) tablet Take 1 tablet by mouth daily.     nitroGLYCERIN (NITROSTAT) 0.4 MG SL tablet as needed.     NP THYROID 60 MG tablet TAKE 1 TABLET(60 MG) BY MOUTH DAILY BEFORE BREAKFAST 30 tablet 3   ondansetron (ZOFRAN) 4 MG tablet Take 1 tablet (4 mg total) by mouth every 8 (eight) hours as needed for nausea or vomiting. 4 tablet 0   pramipexole (MIRAPEX) 1 MG tablet Take 1 mg by mouth daily.     prazosin (MINIPRESS) 2 MG capsule TAKE 1 CAPSULE(2 MG) BY MOUTH AT BEDTIME 30 capsule 2   PRESCRIPTION MEDICATION Take 1 tablet by mouth daily. Take 1 tablet by mouth daily before food and at bedtime.   This is MOTILLIUM ( DOMPERIDONE 10 mg) Tablet     Probiotic Product (RESTORA PO) Take by mouth daily.      rosuvastatin (CRESTOR) 40 MG tablet Take 40 mg by mouth daily.     spironolactone (ALDACTONE) 25 MG tablet Take 25 mg by mouth daily.      TRULICITY 1.5 LA/4.5XM SOPN Inject 1.5 mg into the skin once a week.     No current facility-administered medications for this visit.     Musculoskeletal: Strength & Muscle Tone: na Gait & Station: na Patient leans: N/A  Psychiatric Specialty Exam: Review of Systems  Constitutional:  Positive for fatigue.  Cardiovascular:  Positive for chest pain and palpitations.  Musculoskeletal:  Positive  for arthralgias and joint swelling.  Psychiatric/Behavioral:  Positive for dysphoric mood and sleep disturbance.   All other systems reviewed and are negative.   There were no vitals taken for this visit.There is no height or weight on file to calculate BMI.  General Appearance: NA  Eye Contact:  NA  Speech:  Clear and Coherent  Volume:  Normal  Mood:  Dysphoric  Affect:  NA  Thought Process:  Goal Directed  Orientation:  Full (Time, Place, and Person)  Thought Content: WDL   Suicidal Thoughts:  No  Homicidal Thoughts:  No  Memory:  Immediate;   Good Recent;   Good Remote;   Good  Judgement:  Good  Insight:  Good  Psychomotor Activity:  Decreased  Concentration:  Concentration: Fair and Attention Span: Fair  Recall:  Good  Fund of Knowledge: Good  Language: Good  Akathisia:  No  Handed:  Right  AIMS (if indicated): not done  Assets:  Communication Skills Desire for Improvement Resilience Social Support Talents/Skills  ADL's:  Intact  Cognition: WNL  Sleep:  Fair   Screenings: PHQ2-9    Flowsheet Row Video Visit from 02/08/2022 in [facility] [facility] Video Visit from 11/09/2021 in [facility] [facility] [facility] Visit from 08/12/2021 in [facility] [place] [facility] Video Visit from 04/15/2021 in [place] [facility] from 04[facility]04/2021 [facility]  PHQ-2 Total Score 2 0 '1 1 6  '$ PHQ-9 Total Score 7 -- -- 2 17       Flowsheet Row Video Visit from 02/08/2022 in [place] [facility] Video Visit from 11/09/2021 in [place] [facility] Video Visit from 08/12/2021 in [place] [facility]  C-SSRS RISK CATEGORY No Risk No Risk No Risk        Assessment and Plan: This patient is a 51 year old female with a history of posttraumatic stress disorder nightmares depression anxiety ADHD.  She also has a lot of other diagnoses such as lupus and coronary artery disease which are causing her problems.  She is more depressed so we will continue Cymbalta 60 mg twice daily but add Trintellix 10 mg daily also for depression.  She will continue prazosin 2 mg at bedtime for nightmares and sleep and Adderall 20 mg twice daily for ADHD.  She will return to see me in 4 weeks  Collaboration of Care: Collaboration of Care: Primary Care Provider AEB notes will be shared with PCP at patient's request  Patient/Guardian was advised Release of Information must be obtained prior to any record release in order to collaborate their care with an outside provider. Patient/Guardian was advised if they have not already done so to contact the registration department to sign all necessary forms in order for [place] to release information regarding their care.   Consent: Patient/Guardian gives verbal consent for treatment and assignment of benefits for services provided during this visit. Patient/Guardian expressed understanding and agreed to proceed.    [name], MD 02/08/2022, 2:18 PM

## 2022-02-15 LAB — PULMONARY FUNCTION TEST

## 2022-02-18 ENCOUNTER — Other Ambulatory Visit: Payer: Self-pay | Admitting: Internal Medicine

## 2022-02-18 DIAGNOSIS — M79641 Pain in right hand: Secondary | ICD-10-CM

## 2022-02-18 DIAGNOSIS — R768 Other specified abnormal immunological findings in serum: Secondary | ICD-10-CM

## 2022-02-20 ENCOUNTER — Ambulatory Visit: Payer: Medicare Other | Admitting: Internal Medicine

## 2022-02-20 DIAGNOSIS — M79641 Pain in right hand: Secondary | ICD-10-CM

## 2022-02-20 DIAGNOSIS — M797 Fibromyalgia: Secondary | ICD-10-CM

## 2022-02-20 DIAGNOSIS — Z79899 Other long term (current) drug therapy: Secondary | ICD-10-CM

## 2022-02-20 DIAGNOSIS — R768 Other specified abnormal immunological findings in serum: Secondary | ICD-10-CM

## 2022-02-20 DIAGNOSIS — K754 Autoimmune hepatitis: Secondary | ICD-10-CM

## 2022-02-20 NOTE — Telephone Encounter (Signed)
Next Visit: 03/20/2022  Last Visit: 11/16/2021  Labs: 08/03/2020 CMP WNL except Glucose 556 HIGH, Sodium 132 LOW, Chloride 96 LOW 06/24/2020 CBC WNL  Eye exam: Not on file   Current Dose per office note 11/22/2021: Take 1 tablet (200 mg total) by mouth daily.  DX: Bilateral hand pain, positive ANA  Last Fill: 11/22/2021  Okay to refill Plaquenil?

## 2022-03-15 NOTE — Progress Notes (Unsigned)
Office Visit Note  Patient: Lindsay Walsh             Date of Birth: 1971-01-09           MRN: [id number]             PCP: Jani Gravel, MD Referring: Jani Gravel, MD Visit Date: 03/20/2022   Subjective:  Follow-up (Started on Lupus medications, check up on labs. )   History of Present Illness: Lindsay Walsh is a 51 y.o. female here for follow up for suspected seronegative arthritis with positive RNP Abs and autoimmune hepatitis. She started taking hydroxychloroquine 200 mg daily for this and feels her symptoms are overall somewhat improved. She still has numbness and joint pains in bilateral hands. Not seeing significant swelling and no rashes. Labs reviewed from 8/21 at [facility] including CBC and LFTs were normal.  Previous HPI 11/16/2021 Lindsay Walsh is a 51 y.o. female here for follow up for abnormal lab findings and continued ongoing joint pain in multiple areas.  After our last visit did not recommend any specific new anti-inflammatory medication.  She feels overall symptoms are doing worse compared to a year ago.  She has pain pretty much all over.  Some days she feels a sensitivity and pain other times feels like she has more numbness or diminished sensation throughout.  She is getting swelling in her hands and feet that is worse by the end of the day.  She keeps persistent fatigue usually regardless of her sleep quality.  Mixed constipation and diarrhea symptoms.  Repeat laboratory testing by her pain management provider concerning for worsening lab markers for connective tissue disease or inflammation.  Liver function test been remaining stable on Imuran.   Previous HPI 06/16/2020 Lindsay Walsh is a 51 y.o. female here for follow up with hand pain and sometimes all over pain, positive RNP antibodies here for repeat assessment for inflammatory joint pain, nailfold capillaroscopy, and positive RNP test. She continues to have pain in multiple places including her hands, knees, also  neck and upper back. No signficant changes since her initial visit.     Review of Systems  Constitutional:  Positive for fatigue.  HENT:  Positive for mouth dryness. Negative for mouth sores.   Eyes:  Positive for dryness.  Respiratory:  Positive for shortness of breath.   Cardiovascular:  Positive for chest pain and palpitations.  Gastrointestinal:  Negative for blood in stool, constipation and diarrhea.  Endocrine: Positive for increased urination.  Genitourinary:  Negative for involuntary urination.  Musculoskeletal:  Positive for joint pain, gait problem, joint pain, joint swelling, myalgias, muscle weakness, morning stiffness, muscle tenderness and myalgias.  Skin:  Positive for rash, hair loss and sensitivity to sunlight. Negative for color change.  Allergic/Immunologic: Positive for susceptible to infections.  Neurological:  Positive for dizziness and headaches.  Hematological:  Negative for swollen glands.  Psychiatric/Behavioral:  Positive for depressed mood and sleep disturbance. The patient is nervous/anxious.     PMFS History:  Patient Active Problem List   Diagnosis Date Noted   Positive ANA (antinuclear antibody) 11/16/2021   History of colonic polyps 11/15/2021   Bilateral hand pain 06/02/2020   OAB (overactive bladder) 11/05/2019   Recurrent UTI 11/05/2019   Diabetic gastroparesis ([place]) 08/21/2019   Nausea and vomiting 07/15/2019   Diarrhea 07/15/2019   Abnormal finding on urinalysis 07/15/2019   Morbid (severe) obesity due to excess calories [facility])    Essential hypertension    Major  depressive disorder, single episode, unspecified    Restless legs syndrome    Sleep apnea    Fibromyalgia    Hereditary and idiopathic neuropathy, unspecified    Nonspecific mesenteric lymphadenitis    Pain in right foot    Type 2 diabetes mellitus with hyperglycemia ([place])    Hypothyroidism 08/22/2018   DM type 2 causing vascular disease ([place]) 08/21/2018   Mixed hyperlipidemia  08/21/2018   Essential hypertension, benign 08/21/2018   Influenza A 08/10/2018   SIRS (systemic inflammatory response syndrome) ([place]) 08/10/2018   Essential hypertension, malignant 03/07/2015   Cerebral infarction ([place]) 03/05/2015   Dyspareunia 05/06/2014   Obesity 09/30/2013   Bilateral hip pain 07/16/2013   Chronic pain syndrome 07/16/2013   Degenerative disc disease, lumbar 07/16/2013   Greater trochanteric bursitis of both hips 07/16/2013   Nightmares associated with chronic post-traumatic stress disorder 10/18/2012   Sprain of foot 03/14/2012   Weakness 01/29/2012   IBS (irritable bowel syndrome) 08/01/2011   Gastroesophageal reflux disease    Hypertension    Depression    Chest pain    Fasting hyperglycemia    TOBACCO ABUSE 04/28/2010   NARCOTIC ABUSE 04/28/2010   PULMONARY NODULE 04/28/2010   Autoimmune hepatitis ([place]) 04/28/2010   Myalgia and myositis 01/06/2009    Past Medical History:  Diagnosis Date   Acid reflux    Allergic rhinitis due to pollen    Angina    took SL nitro one week ago   Anxiety    Arthritis    back   Arthritis    Autoimmune hepatitis ([place])    Back pain    Chest pain    Normal stress echo in 2011; PVCs; pedal edema   Connective tissue disease ([place])    Coronary heart disease    CVA (cerebral infarction)    Depression    Depression    Diabetes mellitus without complication (HCC)    Dizziness and giddiness    Dyspareunia 05/06/2014   Dysrhythmia    palpatations   Essential (primary) hypertension    Fasting hyperglycemia    Fibromyalgia    Fibromyalgia    Gastroesophageal reflux disease    Chronic abdominal pain; gastroparesis; globus hystericus; irritable bowel syndrome   Glaucoma    Hereditary and idiopathic neuropathy, unspecified    Hyperlipidemia    Hypertension    Impaired glucose tolerance    Irritable bowel syndrome    Major depressive disorder, single episode, unspecified    Morbid (severe) obesity due to excess  calories (HCC)    Narcotic dependence (HCC)    Nausea    Nonspecific mesenteric lymphadenitis    Overweight(278.02)    Pain in limb    Pain in right foot    PTSD (post-traumatic stress disorder)    Pulmonary nodule    Restless legs syndrome    Shortness of breath    with exertion   Sleep apnea    Sleep apnea    on CPAP machine   Sleep apnea, unspecified    Stroke ([place]) 2016   Tobacco abuse    one pack per day; 35 pack years   Type 2 diabetes mellitus with hyperglycemia ([place])     Family History  Problem Relation Age of Onset   Diabetes Mother    Hypertension Mother    Anxiety disorder Mother    Depression Mother    Drug abuse Mother    Heart disease Father    Diabetes Father    Anxiety disorder Father  Alcohol abuse Father    Depression Father    OCD Father    Thyroid disease Sister    Healthy Brother    Healthy Daughter    Healthy Son    ADD / ADHD Son    Alcohol abuse Paternal Grandfather    Depression Paternal Grandfather    Cancer Paternal Grandfather        lung,skin   Tuberculosis Paternal Grandfather    Seizures Paternal Grandmother    Lupus Paternal Grandmother    ADD / ADHD Son    Anesthesia problems Neg Hx    Malignant hyperthermia Neg Hx    Pseudochol deficiency Neg Hx    Hypotension Neg Hx    Bipolar disorder Neg Hx    Dementia Neg Hx    Paranoid behavior Neg Hx    Schizophrenia Neg Hx    Sexual abuse Neg Hx    Physical abuse Neg Hx    Past Surgical History:  Procedure Laterality Date   ABDOMINAL HYSTERECTOMY     TAH&BSO   BACK SURGERY     BLADDER SUSPENSION  09/12/2011   Procedure: TRANSVAGINAL TAPE (TVT) PROCEDURE;  Surgeon: [name], MD;  Location: [facility];  Service: Urology;  Laterality: N/A;   CESAREAN SECTION     X3   CHOLECYSTECTOMY     ESOPHAGOGASTRODUODENOSCOPY ENDOSCOPY     "throat stretched" per pt.   LIVER BIOPSY     x4   LUMBAR EPIDURAL INJECTION  01/2012   TOTAL ABDOMINAL HYSTERECTOMY W/ BILATERAL  SALPINGOOPHORECTOMY     TUBAL LIGATION     Bilateral   UPPER GASTROINTESTINAL ENDOSCOPY  09/13/2010   Social History   Social History Narrative   Divorced since 1994.Lives with boyfriend of 11 years.On disability.   Immunization History  Administered Date(s) Administered   Influenza Inj Mdck Quad Pf 04/20/2016   Influenza Split 04/07/2013, 05/17/2017   Influenza,inj,Quad PF,6+ Mos 05/17/2017, 04/04/2018, 03/31/2019, 04/28/2019   Influenza-Unspecified 04/17/2012, 03/20/2014, 08/09/2014, 05/06/2015   Moderna Sars-Covid-2 Vaccination 02/06/2020, 03/05/2020   Pneumococcal Conjugate-13 12/18/2019   Pneumococcal Polysaccharide-23 04/20/2016     Objective: Vital Signs: BP 113/80 (BP Location: Left Arm, Patient Position: Sitting, Cuff Size: Large)   Pulse (!) 106   Resp 15   Ht 5' (1.524 m)   Wt 199 lb 9.6 oz (90.5 kg)   BMI 38.98 kg/m    Physical Exam Constitutional:      Appearance: She is obese.  Cardiovascular:     Rate and Rhythm: Normal rate and regular rhythm.  Pulmonary:     Effort: Pulmonary effort is normal.     Breath sounds: Normal breath sounds.  Skin:    General: Skin is warm and dry.     Findings: No rash.  Neurological:     Mental Status: She is alert.  Psychiatric:        Mood and Affect: Mood normal.      Musculoskeletal Exam:  Shoulders full ROM, upper back and shoulder tenderness to pressure no palpable swelling Elbows full ROM no tenderness or swelling Wrists full ROM no tenderness or swelling Fingers full ROM, right 3rd MCP tenderness no palpable swelling Knees full ROM no tenderness or swelling Ankles full ROM no tenderness or swelling   Investigation: No additional findings.  Imaging: No results found.  Recent Labs: Lab Results  Component Value Date   WBC 11.8 (H) 01/25/2020   HGB 14.5 01/25/2020   PLT 316 01/25/2020   NA 132 (L) 08/03/2020  K 5.0 08/03/2020   CL 96 (L) 08/03/2020   CO2 23 08/03/2020   GLUCOSE 556 (HH)  08/03/2020   BUN 11 08/03/2020   CREATININE 0.79 08/03/2020   BILITOT 0.4 08/03/2020   ALKPHOS 107 01/25/2020   AST 19 08/03/2020   ALT 21 08/03/2020   PROT 6.7 08/03/2020   ALBUMIN 4.4 01/25/2020   CALCIUM 9.3 08/03/2020   GFRAA 101 08/03/2020    Speciality Comments: Left message for patient to call us for PLQ eye exam.  Procedures:  No procedures performed Allergies: Doxycycline, Fentanyl, Amlodipine, Bupropion, Cefoxitin, Ceftin [cefuroxime axetil], Iodinated contrast media, Iohexol, Norvasc [amlodipine besylate], Wellbutrin [bupropion hcl], Latex, and Tape   Assessment / Plan:     Visit Diagnoses: Positive ANA (antinuclear antibody)  Bilateral hand pain - Plan: hydroxychloroquine (PLAQUENIL) 200 MG tablet - Plan: hydroxychloroquine (PLAQUENIL) 200 MG tablet  Partial but noticeable improvement in fatigue, joint, maybe skin symptoms since starting medication. So does appear to have some degree of CTD or seronegative rheumatoid arthritis contribution. Recommend she can increase dose to 400 mg daily for weight based dose. No change in hepatic function and generally safe or even effectiveness in AIH. Discussed need for ophthalmology monitoring for retinal toxicity.  Fibromyalgia  Still with myofascial pains, generalized sensitivity appears somewhat better today. Stable on the gabapentin, cymbalta, methocarbamol.  Autoimmune hepatitis ([place])  Recent labs reviewed in [facility] everywhere appear stable. No signficant increase in edema and other systemic symptoms.  Orders: No orders of the defined types were placed in this encounter.  Meds ordered this encounter  Medications   hydroxychloroquine (PLAQUENIL) 200 MG tablet    Sig: Take 1 tablet (200 mg total) by mouth 2 (two) times daily.    Dispense:  60 tablet    Refill:  2     Follow-Up Instructions: Return in about 3 months (around 06/19/2022) for RA/AIH HCQ increase f/u 60mo.   CCollier Salina MD  Note - This record  has been created using [employer]  Chart creation errors have been sought, but may not always  have been located. Such creation errors do not reflect on  the standard of medical care.

## 2022-03-20 ENCOUNTER — Ambulatory Visit: Payer: Medicare Other | Attending: Internal Medicine | Admitting: Internal Medicine

## 2022-03-20 ENCOUNTER — Encounter: Payer: Self-pay | Admitting: Internal Medicine

## 2022-03-20 VITALS — BP 113/80 | HR 106 | Resp 15 | Ht 60.0 in | Wt 199.6 lb

## 2022-03-20 DIAGNOSIS — M79642 Pain in left hand: Secondary | ICD-10-CM

## 2022-03-20 DIAGNOSIS — K754 Autoimmune hepatitis: Secondary | ICD-10-CM | POA: Diagnosis not present

## 2022-03-20 DIAGNOSIS — R768 Other specified abnormal immunological findings in serum: Secondary | ICD-10-CM

## 2022-03-20 DIAGNOSIS — M797 Fibromyalgia: Secondary | ICD-10-CM | POA: Diagnosis not present

## 2022-03-20 DIAGNOSIS — M79641 Pain in right hand: Secondary | ICD-10-CM | POA: Diagnosis not present

## 2022-03-20 MED ORDER — HYDROXYCHLOROQUINE SULFATE 200 MG PO TABS: 200.0000 mg | ORAL_TABLET | Freq: Two times a day (BID) | ORAL | 2 refills | Status: DC

## 2022-03-27 NOTE — Progress Notes (Deleted)
Office Visit Note  Patient: Lindsay Walsh             Date of Birth: [birth year]           MRN: [id number]             PCP: Jani Gravel, MD Referring: Jani Gravel, MD Visit Date: 04/05/2022   Subjective:  No chief complaint on file.   History of Present Illness: Lindsay Walsh is a 51 y.o. female here for follow up for suspected seronegative arthritis with positive RNP Abs and autoimmune hepatitis   Previous HPI 03/20/2022 Lindsay Walsh is a 51 y.o. female here for follow up for suspected seronegative arthritis with positive RNP Abs and autoimmune hepatitis. She started taking hydroxychloroquine 200 mg daily for this and feels her symptoms are overall somewhat improved. She still has numbness and joint pains in bilateral hands. Not seeing significant swelling and no rashes. Labs reviewed from 8/21 at [facility] including CBC and LFTs were normal.   Previous HPI 11/16/2021 Lindsay Walsh is a 51 y.o. female here for follow up for abnormal lab findings and continued ongoing joint pain in multiple areas.  After our last visit did not recommend any specific new anti-inflammatory medication.  She feels overall symptoms are doing worse compared to a year ago.  She has pain pretty much all over.  Some days she feels a sensitivity and pain other times feels like she has more numbness or diminished sensation throughout.  She is getting swelling in her hands and feet that is worse by the end of the day.  She keeps persistent fatigue usually regardless of her sleep quality.  Mixed constipation and diarrhea symptoms.  Repeat laboratory testing by her pain management provider concerning for worsening lab markers for connective tissue disease or inflammation.  Liver function test been remaining stable on Imuran.   Previous HPI 06/16/2020 Lindsay Walsh is a 51 y.o. female here for follow up with hand pain and sometimes all over pain, positive RNP antibodies here for repeat assessment for inflammatory  joint pain, nailfold capillaroscopy, and positive RNP test. She continues to have pain in multiple places including her hands, knees, also neck and upper back. No signficant changes since her initial visit.     No Rheumatology ROS completed.   PMFS History:  Patient Active Problem List   Diagnosis Date Noted   Positive ANA (antinuclear antibody) 11/16/2021   History of colonic polyps 11/15/2021   Bilateral hand pain 06/02/2020   OAB (overactive bladder) 11/05/2019   Recurrent UTI 11/05/2019   Diabetic gastroparesis ([place]) 08/21/2019   Nausea and vomiting 07/15/2019   Diarrhea 07/15/2019   Abnormal finding on urinalysis 07/15/2019   Morbid (severe) obesity due to excess calories ([place])    Essential hypertension    Major depressive disorder, single episode, unspecified    Restless legs syndrome    Sleep apnea    Fibromyalgia    Hereditary and idiopathic neuropathy, unspecified    Nonspecific mesenteric lymphadenitis    Pain in right foot    Type 2 diabetes mellitus with hyperglycemia ([place])    Hypothyroidism 08/22/2018   DM type 2 causing vascular disease ([place]) 08/21/2018   Mixed hyperlipidemia 08/21/2018   Essential hypertension, benign 08/21/2018   Influenza A 08/10/2018   SIRS (systemic inflammatory response syndrome) ([place]) 08/10/2018   Essential hypertension, malignant 03/07/2015   Cerebral infarction ([place]) 03/05/2015   Dyspareunia 05/06/2014   Obesity 09/30/2013   Bilateral hip pain 07/16/2013  Chronic pain syndrome 07/16/2013   Degenerative disc disease, lumbar 07/16/2013   Greater trochanteric bursitis of both hips 07/16/2013   Nightmares associated with chronic post-traumatic stress disorder 10/18/2012   Sprain of foot 03/14/2012   Weakness 01/29/2012   IBS (irritable bowel syndrome) 08/01/2011   Gastroesophageal reflux disease    Hypertension    Depression    Chest pain    Fasting hyperglycemia    TOBACCO ABUSE 04/28/2010   NARCOTIC ABUSE 04/28/2010    PULMONARY NODULE 04/28/2010   Autoimmune hepatitis ([place]) 04/28/2010   Myalgia and myositis 01/06/2009    Past Medical History:  Diagnosis Date   Acid reflux    Allergic rhinitis due to pollen    Angina    took SL nitro one week ago   Anxiety    Arthritis    back   Arthritis    Autoimmune hepatitis ([place])    Back pain    Chest pain    Normal stress echo in 2011; PVCs; pedal edema   Connective tissue disease ([place])    Coronary heart disease    CVA (cerebral infarction)    Depression    Depression    Diabetes mellitus without complication ([place])    Dizziness and giddiness    Dyspareunia 05/06/2014   Dysrhythmia    palpatations   Essential (primary) hypertension    Fasting hyperglycemia    Fibromyalgia    Fibromyalgia    Gastroesophageal reflux disease    Chronic abdominal pain; gastroparesis; globus hystericus; irritable bowel syndrome   Glaucoma    Hereditary and idiopathic neuropathy, unspecified    Hyperlipidemia    Hypertension    Impaired glucose tolerance    Irritable bowel syndrome    Major depressive disorder, single episode, unspecified    Morbid (severe) obesity due to excess calories (HCC)    Narcotic dependence (HCC)    Nausea    Nonspecific mesenteric lymphadenitis    Overweight(278.02)    Pain in limb    Pain in right foot    PTSD (post-traumatic stress disorder)    Pulmonary nodule    Restless legs syndrome    Shortness of breath    with exertion   Sleep apnea    Sleep apnea    on CPAP machine   Sleep apnea, unspecified    Stroke ([place]) 2016   Tobacco abuse    one pack per day; 35 pack years   Type 2 diabetes mellitus with hyperglycemia ([place])     Family History  Problem Relation Age of Onset   Diabetes Mother    Hypertension Mother    Anxiety disorder Mother    Depression Mother    Drug abuse Mother    Heart disease Father    Diabetes Father    Anxiety disorder Father    Alcohol abuse Father    Depression Father    OCD Father     Thyroid disease Sister    Healthy Brother    Healthy Daughter    Healthy Son    ADD / ADHD Son    Alcohol abuse Paternal Grandfather    Depression Paternal Grandfather    Cancer Paternal Grandfather        lung,skin   Tuberculosis Paternal Grandfather    Seizures Paternal Grandmother    Lupus Paternal Grandmother    ADD / ADHD Son    Anesthesia problems Neg Hx    Malignant hyperthermia Neg Hx    Pseudochol deficiency Neg Hx    Hypotension  Neg Hx    Bipolar disorder Neg Hx    Dementia Neg Hx    Paranoid behavior Neg Hx    Schizophrenia Neg Hx    Sexual abuse Neg Hx    Physical abuse Neg Hx    Past Surgical History:  Procedure Laterality Date   ABDOMINAL HYSTERECTOMY     TAH&BSO   BACK SURGERY     BLADDER SUSPENSION  09/12/2011   Procedure: TRANSVAGINAL TAPE (TVT) PROCEDURE;  Surgeon: [name], MD;  Location: [facility];  Service: Urology;  Laterality: N/A;   CESAREAN SECTION     X3   CHOLECYSTECTOMY     ESOPHAGOGASTRODUODENOSCOPY ENDOSCOPY     "throat stretched" per pt.   LIVER BIOPSY     x4   LUMBAR EPIDURAL INJECTION  01/2012   TOTAL ABDOMINAL HYSTERECTOMY W/ BILATERAL SALPINGOOPHORECTOMY     TUBAL LIGATION     Bilateral   UPPER GASTROINTESTINAL ENDOSCOPY  09/13/2010   Social History   Social History Narrative   Divorced since 1994.Lives with boyfriend of 11 years.On disability.   Immunization History  Administered Date(s) Administered   Influenza Inj Mdck Quad Pf 04/20/2016   Influenza Split 04/07/2013, 05/17/2017   Influenza,inj,Quad PF,6+ Mos 05/17/2017, 04/04/2018, 03/31/2019, 04/28/2019   Influenza-Unspecified 04/17/2012, 03/20/2014, 08/09/2014, 05/06/2015   Moderna Sars-Covid-2 Vaccination 02/06/2020, 03/05/2020   Pneumococcal Conjugate-13 12/18/2019   Pneumococcal Polysaccharide-23 04/20/2016     Objective: Vital Signs: There were no vitals taken for this visit.   Physical Exam   Musculoskeletal Exam: ***  CDAI Exam: CDAI Score:  -- Patient Global: --; Provider Global: -- Swollen: --; Tender: -- Joint Exam 04/05/2022   No joint exam has been documented for this visit   There is currently no information documented on the homunculus. Go to the Rheumatology activity and complete the homunculus joint exam.  Investigation: No additional findings.  Imaging: No results found.  Recent Labs: Lab Results  Component Value Date   WBC 11.8 (H) 01/25/2020   HGB 14.5 01/25/2020   PLT 316 01/25/2020   NA 132 (L) 08/03/2020   K 5.0 08/03/2020   CL 96 (L) 08/03/2020   CO2 23 08/03/2020   GLUCOSE 556 (HH) 08/03/2020   BUN 11 08/03/2020   CREATININE 0.79 08/03/2020   BILITOT 0.4 08/03/2020   ALKPHOS 107 01/25/2020   AST 19 08/03/2020   ALT 21 08/03/2020   PROT 6.7 08/03/2020   ALBUMIN 4.4 01/25/2020   CALCIUM 9.3 08/03/2020   GFRAA 101 08/03/2020    Speciality Comments: Left message for patient to call us for PLQ eye exam.  Procedures:  No procedures performed Allergies: Doxycycline, Fentanyl, Amlodipine, Bupropion, Cefoxitin, Ceftin [cefuroxime axetil], Iodinated contrast media, Iohexol, Norvasc [amlodipine besylate], Wellbutrin [bupropion hcl], Latex, and Tape   Assessment / Plan:     Visit Diagnoses: No diagnosis found.  ***  Orders: No orders of the defined types were placed in this encounter.  No orders of the defined types were placed in this encounter.    Follow-Up Instructions: No follow-ups on file.   Bertram Savin, RT  Note - This record has been created using [employer].  Chart creation errors have been sought, but may not always  have been located. Such creation errors do not reflect on  the standard of medical care.

## 2022-04-03 NOTE — Progress Notes (Signed)
No show

## 2022-04-04 NOTE — Progress Notes (Unsigned)
   Procedure Note  Patient: Lindsay Walsh             Date of Birth: [birth year]           MRN: [id number]             Visit Date: 04/05/2022  Procedures: Visit Diagnoses: No diagnosis found.  No procedures performed

## 2022-04-05 ENCOUNTER — Ambulatory Visit: Payer: Medicare Other

## 2022-04-05 ENCOUNTER — Ambulatory Visit: Payer: Medicare Other | Admitting: Internal Medicine

## 2022-04-05 ENCOUNTER — Ambulatory Visit: Payer: Medicare Other | Attending: Internal Medicine | Admitting: Internal Medicine

## 2022-04-05 VITALS — BP 126/86 | HR 105

## 2022-04-05 DIAGNOSIS — M79642 Pain in left hand: Secondary | ICD-10-CM | POA: Diagnosis not present

## 2022-04-05 DIAGNOSIS — G5601 Carpal tunnel syndrome, right upper limb: Secondary | ICD-10-CM | POA: Diagnosis not present

## 2022-04-05 DIAGNOSIS — M79641 Pain in right hand: Secondary | ICD-10-CM | POA: Diagnosis not present

## 2022-04-05 DIAGNOSIS — K754 Autoimmune hepatitis: Secondary | ICD-10-CM

## 2022-04-05 DIAGNOSIS — Z79899 Other long term (current) drug therapy: Secondary | ICD-10-CM

## 2022-04-05 DIAGNOSIS — G5603 Carpal tunnel syndrome, bilateral upper limbs: Secondary | ICD-10-CM | POA: Diagnosis not present

## 2022-04-05 DIAGNOSIS — G5602 Carpal tunnel syndrome, left upper limb: Secondary | ICD-10-CM

## 2022-04-05 DIAGNOSIS — R768 Other specified abnormal immunological findings in serum: Secondary | ICD-10-CM

## 2022-04-05 DIAGNOSIS — M797 Fibromyalgia: Secondary | ICD-10-CM

## 2022-04-05 MED ORDER — LIDOCAINE HCL 1 % IJ SOLN
1.0000 mL | INTRAMUSCULAR | Status: AC | PRN
  Administered 2022-04-05: 1 mL

## 2022-04-05 MED ORDER — TRIAMCINOLONE ACETONIDE 40 MG/ML IJ SUSP
40.0000 mg | INTRAMUSCULAR | Status: AC | PRN
  Administered 2022-04-05: 40 mg

## 2022-04-11 ENCOUNTER — Ambulatory Visit: Payer: Medicare Other | Attending: Cardiology | Admitting: Cardiology

## 2022-04-11 ENCOUNTER — Encounter: Payer: Self-pay | Admitting: Cardiology

## 2022-04-11 VITALS — BP 112/80 | HR 102 | Ht 60.0 in | Wt 194.2 lb

## 2022-04-11 DIAGNOSIS — E1159 Type 2 diabetes mellitus with other circulatory complications: Secondary | ICD-10-CM

## 2022-04-11 DIAGNOSIS — R9439 Abnormal result of other cardiovascular function study: Secondary | ICD-10-CM | POA: Diagnosis not present

## 2022-04-11 DIAGNOSIS — E1169 Type 2 diabetes mellitus with other specified complication: Secondary | ICD-10-CM | POA: Diagnosis not present

## 2022-04-11 DIAGNOSIS — I10 Essential (primary) hypertension: Secondary | ICD-10-CM

## 2022-04-11 DIAGNOSIS — I2089 Other forms of angina pectoris: Secondary | ICD-10-CM | POA: Diagnosis not present

## 2022-04-11 DIAGNOSIS — E785 Hyperlipidemia, unspecified: Secondary | ICD-10-CM

## 2022-04-11 DIAGNOSIS — E782 Mixed hyperlipidemia: Secondary | ICD-10-CM

## 2022-04-11 MED ORDER — PREDNISONE 50 MG PO TABS: ORAL_TABLET | ORAL | 0 refills | Status: DC

## 2022-04-11 NOTE — H&P (View-Only) (Signed)
Primary Care Provider: [facility] Cardiologist: Roxan Diesel, MD [facility] Interventional Cardiologist: [name], MD  Electrophysiologist: None  INTERVENTIONAL CARDIOLOGY CONSULT  Clinic Note: Chief Complaint  Patient presents with   New Patient (Initial Visit)    Evaluation of shortness of breath on exertion and chest pain; by report-abnormal stress test   ===================================  ASSESSMENT/PLAN   Problem List Items Addressed This Visit       Cardiology Problems   Hyperlipidemia associated with type 2 diabetes mellitus ([place]) (Chronic)    She is on rosuvastatin 40 mg daily.  I do not have labs from PCP.  She is on metformin and Trulicity        Relevant Medications   isosorbide mononitrate (IMDUR) 30 MG 24 hr tablet   Other Relevant Orders   EKG 12-Lead (Completed)   CBC (Completed)   Basic metabolic panel (Completed)   Atypical angina - Primary    Some of the features of chest pain are quite typical and that there associate with exertional dyspnea with chest tightness or pressure, but she also has a chest tightness or pressure at rest which is somewhat atypical.  Would suggest that her levels probably at least NYHA Class III based on her description -> this is despite being on metoprolol and Imdur. Palpitations seem to be controlled with propafenone.      Relevant Medications   isosorbide mononitrate (IMDUR) 30 MG 24 hr tablet   Other Relevant Orders   EKG 12-Lead (Completed)   CBC (Completed)   Basic metabolic panel (Completed)   DM type 2 causing vascular disease (HCC) (Chronic)   Relevant Medications   isosorbide mononitrate (IMDUR) 30 MG 24 hr tablet   Other Relevant Orders   EKG 12-Lead (Completed)   CBC (Completed)   Basic metabolic panel (Completed)   Essential hypertension, benign (Chronic)    Blood pressure seems pretty well controlled on current meds.  She is taking lisinopril 40 mg daily,  spironolactone 25 mg daily, and metoprolol succinate 100 mg daily. She also is on Imdur 30 mg.      Relevant Medications   isosorbide mononitrate (IMDUR) 30 MG 24 hr tablet     Other   Abnormal nuclear stress test (Chronic)    Unfortunately, I do not have the results of the test.  We are in the process to try to get them primer cardiologist.  Hopefully we will have them for a heart catheterization scheduled.  She has been referred because of the abnormal stress test to consider cardiac catheterization in a patient with risk factors and anginal symptoms.  For antianginal medications she is on Imdur and high-dose metoprolol with ongoing symptoms.  Talked about options going forward are either doing a noninvasive Coronary CTA versus going to cardiac catheterization for definitive evaluation.  Both options were discussed in detail and shared decision making, she decided to proceed with cardiac catheterization and possible PCI.  Shared Decision Making/Informed Consent The risks [stroke (1 in 1000), death (1 in 1000), kidney failure [usually temporary] (1 in 500), bleeding (1 in 200), allergic reaction [possibly serious] (1 in 200)], benefits (diagnostic support and management of coronary artery disease) and alternatives of a cardiac catheterization were discussed in detail with Ms. Marek and she is willing to proceed.      Relevant Orders   EKG 12-Lead (Completed)   CBC (Completed)   Basic metabolic panel (Completed)   Morbid (severe) obesity due to excess calories (HCC) (Chronic)  We did discuss need for dietary medication increase exercise.  Hopefully potentially take care of some anginal symptoms, she would be able to exercise more.  She needs to continue to have her thyroid adequately controlled.      Relevant Orders   EKG 12-Lead (Completed)   CBC (Completed)   Basic metabolic panel (Completed)    ===================================  HPI:    Lindsay Walsh is an obese 51  y.o. female former smoker with PMH notable for SLE and Polyarthralgia,  DM-2, history of Autoimmune Hepatitis and Stroke,, as well as some type of Arrhythmia on Propafenone who is being seen today for the evaluation of That and Shortness of Breath with Exertion-Abnormal Stress Test at the request of Tonia Ghent, MD.  [name] was seen by Dr. Tonia Ghent from [facility] and referred for evaluation.  Unfortunately I do not have his clinic note nor do I have the studies that were performed.  She was seen for evaluation of shortness of breath and off-and-on chest discomfort as well as dizziness.  Per report she had an echocardiogram and stress test done last month and was told they were abnormal.  Recent Hospitalizations:  None  Reviewed  CV studies:    The following studies were reviewed today: (if available, images/films reviewed: From Epic Chart or Care Everywhere) Echocardiogram and Nuclear Stress Test-reportedly abnormal stress test.  Reports incoming. By report PFTs normal. Lung cancer screening CT June 20 to 23: Negative screening for lung cancer.  Coronary artery calcifications noted.  Interval History:   REDITH Walsh is a very pleasant 51 year old woman with a pretty complicated medical history that was referred for abnormal stress test performed to evaluate chest discomfort and exertional dyspnea.  She mentions that she gets short of breath with less and less activity now.  Definitely with increased level of activity she will then start noticing chest discomfort.  She says the chest comfort can be both at rest and with activity but it certainly will occur with increased level of activity.  Usually if she keeps on going beyond having some dyspnea, the chest discomfort comes on.  Most of the time it goes away after stopping to rest and catch her breath. She was having issues with palpitations apparently in the past and was started on a medicine that starts with a P  -- which I imagine is propafenone based on her med list.  She said that since starting the propafenone, that seems to have improved dramatically.  Did seeing that some of her chest discomfort symptoms were related to the palpitations but not all.  Because she is still having the chest pains/pressure.  She is obese and a former smoker who is quite deconditioned and therefore probably has plenty reason to have exertional dyspnea but it seems to have worsened when the chest discomfort became apparent.  Over the last few months things have gotten worse.  She denies any heart failure symptoms of PND, orthopnea and only has mild end of day swelling. Palpitations as mentioned seem to be better controlled.  No rapid irregular heartbeats.  No syncope or near syncope.  No TIA or amaurosis fugax.  No claudication.  REVIEWED OF SYSTEMS   Review of Systems  Constitutional:  Positive for malaise/fatigue. Negative for weight loss.  HENT:  Negative for congestion.   Respiratory:  Positive for shortness of breath (Per HPI). Negative for cough and wheezing.   Gastrointestinal:  Positive for heartburn. Negative for abdominal pain, blood  in stool, constipation and melena.  Genitourinary:  Negative for hematuria.  Musculoskeletal:  Positive for back pain, joint pain and myalgias. Negative for falls.       Has combination of fibromyalgia and polymyalgia.-Likely all related to SLE.  Neurological:  Positive for dizziness (Off-and-on dizziness).  Psychiatric/Behavioral:  Positive for depression. Negative for memory loss. The patient is nervous/anxious.        Seems to be pretty well controlled.    I have reviewed and (if needed) personally updated the patient's problem list, medications, allergies, past medical and surgical history, social and family history.   PAST MEDICAL HISTORY   Past Medical History:  Diagnosis Date   Acid reflux    Allergic rhinitis due to pollen    Anxiety    Arthritis    back    Autoimmune hepatitis ([place])    Back pain    Connective tissue disease ([place])    Depression    Dizziness and giddiness    Dyspareunia 05/06/2014   Dysrhythmia    palpatations; on propafenone   Essential (primary) hypertension    Fibromyalgia    Gastroesophageal reflux disease    Chronic abdominal pain; gastroparesis; globus hystericus; irritable bowel syndrome   Glaucoma    Hereditary and idiopathic neuropathy, unspecified    Hyperlipidemia    Irritable bowel syndrome    Major depressive disorder, single episode, unspecified    Morbid (severe) obesity due to excess calories (HCC)    Narcotic dependence ([place])    Nonspecific mesenteric lymphadenitis    Overweight(278.02)    Pain in limb    Pain in right foot    PTSD (post-traumatic stress disorder)    Pulmonary nodule    Restless legs syndrome    Sleep apnea    on CPAP machine   Stroke ([place]) 2016   Systemic lupus erythematosus (SLE) in adult [facility])    Tobacco abuse    one pack per day; 35 pack years; quit in February 2023   Type 2 diabetes mellitus with hyperglycemia ([place])     PAST SURGICAL HISTORY   Past Surgical History:  Procedure Laterality Date   ABDOMINAL HYSTERECTOMY     TAH&BSO   BACK SURGERY     BLADDER SUSPENSION  09/12/2011   Procedure: TRANSVAGINAL TAPE (TVT) PROCEDURE;  Surgeon: [name], MD;  Location: [facility];  Service: Urology;  Laterality: N/A;   CESAREAN SECTION     X3   CHOLECYSTECTOMY     ESOPHAGOGASTRODUODENOSCOPY ENDOSCOPY     "throat stretched" per pt.   LIVER BIOPSY     x4   LUMBAR EPIDURAL INJECTION  01/2012   TOTAL ABDOMINAL HYSTERECTOMY W/ BILATERAL SALPINGOOPHORECTOMY     TUBAL LIGATION     Bilateral   UPPER GASTROINTESTINAL ENDOSCOPY  09/13/2010    Immunization History  Administered Date(s) Administered   Influenza Inj Mdck Quad Pf 04/20/2016   Influenza Split 04/07/2013, 05/17/2017   Influenza,inj,Quad PF,6+ Mos 05/17/2017, 04/04/2018, 03/31/2019, 04/28/2019    Influenza-Unspecified 04/17/2012, 03/20/2014, 08/09/2014, 05/06/2015   Moderna Sars-Covid-2 Vaccination 02/06/2020, 03/05/2020   Pneumococcal Conjugate-13 12/18/2019   Pneumococcal Polysaccharide-23 04/20/2016    MEDICATIONS/ALLERGIES   Current Meds  Medication Sig   ACCU-CHEK GUIDE test strip USE TO CHECK BLOOD SUGAR FOUR TIMES DAILY   Accu-Chek Softclix Lancets lancets CHECK SUGAR ONCE DAILY   amphetamine-dextroamphetamine (ADDERALL) 20 MG tablet Take 1 tablet (20 mg total) by mouth 2 (two) times daily.   Ascorbic Acid (VITAMIN C) 1000 MG tablet Take 500 mg  by mouth every morning.    aspirin 325 MG EC tablet Take 325 mg by mouth daily.   azaTHIOprine (IMURAN) 50 MG tablet Take 1 tablet (50 mg total) by mouth daily.   bimatoprost (LUMIGAN) 0.01 % SOLN Place 1 drop into both eyes at bedtime.   calcium-vitamin D (OSCAL WITH D) 500-200 MG-UNIT tablet Take 1 tablet by mouth 2 (two) times daily.   cycloSPORINE (RESTASIS) 0.05 % ophthalmic emulsion Place 1 drop into both eyes daily.   dicyclomine (BENTYL) 10 MG capsule Take 1 capsule (10 mg total) by mouth 2 (two) times daily as needed.   Dulaglutide (TRULICITY) 6.06 TK/1.6WF SOPN Inject 0.75 mg into the skin once a week.   DULoxetine (CYMBALTA) 60 MG capsule TAKE 1 CAPSULE(60 MG) BY MOUTH TWICE DAILY   esomeprazole (NEXIUM) 40 MG capsule TAKE 1 CAPSULE(40 MG) BY MOUTH EVERY MORNING   FARXIGA 10 MG TABS tablet Take 10 mg by mouth daily.   fexofenadine (ALLEGRA) 60 MG tablet Take 1 tablet by mouth 2 (two) times daily as needed for allergies.    gabapentin (NEURONTIN) 300 MG capsule Take 100 mg by mouth 3 (three) times daily.   Garlic 0932 MG CAPS Take by mouth.   HYDROcodone-acetaminophen (NORCO/VICODIN) 5-325 MG tablet Take 1 tablet by mouth every 6 (six) hours as needed for moderate pain. 0.5 -1 po bid prn   hydroxychloroquine (PLAQUENIL) 200 MG tablet Take 1 tablet (200 mg total) by mouth 2 (two) times daily.   isosorbide mononitrate  (IMDUR) 30 MG 24 hr tablet Take 30 mg by mouth every morning.   lisinopril (ZESTRIL) 40 MG tablet TAKE 1 TABLET(40 MG) BY MOUTH DAILY   meclizine (ANTIVERT) 12.5 MG tablet Take 1 tablet (12.5 mg total) by mouth 3 (three) times daily as needed for dizziness.   metFORMIN (GLUCOPHAGE) 1000 MG tablet Take 1 tablet (1,000 mg total) by mouth 2 (two) times daily with a meal.   methocarbamol (ROBAXIN) 500 MG tablet Take 500 mg by mouth in the morning and at bedtime. Prn.   metoprolol succinate (TOPROL-XL) 100 MG 24 hr tablet TAKE 1 TABLET(100 MG) BY MOUTH DAILY WITH FOOD   mirabegron ER (MYRBETRIQ) 50 MG TB24 tablet Take 1 tablet (50 mg total) by mouth daily.   Multiple Vitamins-Minerals (CENTRUM) tablet Take 1 tablet by mouth daily.   nitroGLYCERIN (NITROLINGUAL) 0.4 MG/SPRAY spray Place under the tongue.   NP THYROID 60 MG tablet TAKE 1 TABLET(60 MG) BY MOUTH DAILY BEFORE BREAKFAST   ondansetron (ZOFRAN) 4 MG tablet Take 1 tablet (4 mg total) by mouth every 8 (eight) hours as needed for nausea or vomiting.   pramipexole (MIRAPEX) 1 MG tablet Take 1 mg by mouth daily.   prazosin (MINIPRESS) 2 MG capsule TAKE 1 CAPSULE(2 MG) BY MOUTH AT BEDTIME   predniSONE (DELTASONE) 50 MG tablet Take 50 mg tablet at 8pm 04/20/22, take 50 mg  at 2 am on 04/21/22 and 50 mg at 7 am on 04/21/22 with 50 mg  benadryl   PRESCRIPTION MEDICATION Take 1 tablet by mouth daily. Take 1 tablet by mouth daily before food and at bedtime.   This is MOTILLIUM ( DOMPERIDONE 10 mg) Tablet   PREVIDENT 5000 PLUS 1.1 % CREA dental cream Take by mouth every morning.   Probiotic Product (RESTORA PO) Take by mouth daily.    propafenone (RYTHMOL) 150 MG tablet Take 150 mg by mouth 3 (three) times daily.   rosuvastatin (CRESTOR) 40 MG tablet Take 40 mg by mouth  daily.   spironolactone (ALDACTONE) 25 MG tablet Take 25 mg by mouth daily.   TRULICITY 1.5 QP/6.1PJ SOPN Inject 1.5 mg into the skin once a week.    Allergies  Allergen Reactions    Doxycycline Shortness Of Breath and Rash   Fentanyl Shortness Of Breath, Itching and Other (See Comments)    Heart racing, SOB   Amlodipine     Unknown reaction   Bupropion     Unknown reaction   Cefoxitin     Unknown reaction   Ceftin [Cefuroxime Axetil]     Unknown reaction   Iodinated Contrast Media Other (See Comments)    Knots on body   Iohexol Other (See Comments)    Knots on body    Norvasc [Amlodipine Besylate]    Wellbutrin [Bupropion Hcl] Other (See Comments)    unknown   Latex Rash   Tape Rash    SOCIAL HISTORY/FAMILY HISTORY   Reviewed in Epic:   Social History   Tobacco Use   Smoking status: Former    Packs/day: 1.50    Years: 25.00    Total pack years: 37.50    Types: Cigarettes    Quit date: 04/25/2013    Years since quitting: 8.9   Smokeless tobacco: Never  Vaping Use   Vaping Use: Some days  Substance Use Topics   Alcohol use: No   Drug use: No   Social History   Social History Narrative   Divorced since 42.Lives with boyfriend of 11 years.On disability.   Family History  Problem Relation Age of Onset   Diabetes Mother    Hypertension Mother    Anxiety disorder Mother    Depression Mother    Drug abuse Mother    Heart disease Father    Diabetes Father    Anxiety disorder Father    Alcohol abuse Father    Depression Father    OCD Father    Thyroid disease Sister    Healthy Brother    Healthy Daughter    Healthy Son    ADD / ADHD Son    Alcohol abuse Paternal Grandfather    Depression Paternal Grandfather    Cancer Paternal Grandfather        lung,skin   Tuberculosis Paternal Grandfather    Seizures Paternal Grandmother    Lupus Paternal Grandmother    ADD / ADHD Son    Anesthesia problems Neg Hx    Malignant hyperthermia Neg Hx    Pseudochol deficiency Neg Hx    Hypotension Neg Hx    Bipolar disorder Neg Hx    Dementia Neg Hx    Paranoid behavior Neg Hx    Schizophrenia Neg Hx    Sexual abuse Neg Hx    Physical  abuse Neg Hx     OBJCTIVE -PE, EKG, labs   Wt Readings from Last 3 Encounters:  04/11/22 194 lb 3.2 oz (88.1 kg)  03/20/22 199 lb 9.6 oz (90.5 kg)  11/16/21 198 lb (89.8 kg)    Physical Exam: BP 112/80   Pulse (!) 102   Ht 5' (1.524 m)   Wt 194 lb 3.2 oz (88.1 kg)   SpO2 97%   BMI 37.93 kg/m  Physical Exam Vitals reviewed.  Constitutional:      General: She is not in acute distress.    Appearance: Normal appearance. She is obese. She is not ill-appearing or toxic-appearing.     Comments: Well-groomed.  HENT:     Head: Normocephalic and atraumatic.  Eyes:     Extraocular Movements: Extraocular movements intact.     Pupils: Pupils are equal, round, and reactive to light.  Neck:     Vascular: No carotid bruit (Difficult to assess) or JVD.  Cardiovascular:     Rate and Rhythm: Regular rhythm. Tachycardia present. No extrasystoles are present.    Chest Wall: PMI is not displaced (Difficult to palpate).     Pulses: No decreased pulses.     Heart sounds: S1 normal and S2 normal. Heart sounds are distant. No murmur heard.    No friction rub. No gallop.  Pulmonary:     Effort: Pulmonary effort is normal. No respiratory distress.     Breath sounds: Normal breath sounds. No wheezing, rhonchi or rales.     Comments: Distant breath sounds. Chest:     Chest wall: No tenderness.  Musculoskeletal:        General: Swelling (At least 1+ bilateral ankle) present.     Cervical back: Normal range of motion and neck supple.  Skin:    General: Skin is warm and dry.  Neurological:     General: No focal deficit present.     Mental Status: She is alert and oriented to person, place, and time.     Gait: Gait normal.  Psychiatric:        Mood and Affect: Mood normal.        Behavior: Behavior normal.        Thought Content: Thought content normal.        Judgment: Judgment normal.     Comments: Very anxious.     Adult ECG Report  Rate: 102 ;  Rhythm: sinus tachycardia and otherwise  normal axis, intervals and durations. ;   Narrative Interpretation: Stable  Recent Labs: Recent labs not available Lab Results  Component Value Date   CHOL 153 04/25/2019   HDL 37 04/25/2019   LDLCALC 91 04/25/2019   TRIG 141 04/25/2019   CHOLHDL 4.9 03/06/2015   Lab Results  Component Value Date   CREATININE 1.14 (H) 04/13/2022   BUN 16 04/13/2022   NA 136 04/13/2022   K 5.3 (H) 04/13/2022   CL 99 04/13/2022   CO2 22 04/13/2022      Latest Ref Rng & Units 04/13/2022   12:27 PM 01/25/2020    8:25 PM 10/20/2019   10:30 AM  CBC  WBC 3.4 - 10.8 x10E3/uL 12.7  11.8  9.0   Hemoglobin 11.1 - 15.9 g/dL 13.2  14.5  12.7   Hematocrit 34.0 - 46.6 % 38.9  43.2  37.8   Platelets 150 - 450 x10E3/uL 380  316  300     Lab Results  Component Value Date   HGBA1C 11.4 (H) 08/03/2020   Lab Results  Component Value Date   TSH 2.79 08/03/2020    ================================================== I spent a total of 35  minutes with the patient spent in direct patient consultation.  Additional time spent with chart review  / charting (studies, outside notes, etc): 46 min Total Time: 81 min  Current medicines are reviewed at length with the patient today.  (+/- concerns) none   Notice: This dictation was prepared with Dragon dictation along with smart phrase technology. Any transcriptional errors that result from this process are unintentional and may not be corrected upon review.   Studies Ordered:  Orders Placed This Encounter  Procedures   CBC   Basic metabolic panel   EKG 86-VEHM   Meds ordered this  encounter  Medications   predniSONE (DELTASONE) 50 MG tablet    Sig: Take 50 mg tablet at 8pm 04/20/22, take 50 mg  at 2 am on 04/21/22 and 50 mg at 7 am on 04/21/22 with 50 mg  benadryl    Dispense:  3 tablet    Refill:  0    Patient Instructions / Medication Changes & Studies & Tests Ordered   Patient Instructions  Medication Instructions:  No changes  *If you need a  refill on your cardiac medications before your next appointment, please call your pharmacy*   Lab Work: CBC BMP  If you have labs (blood work) drawn today and your tests are completely normal, you will receive your results only by: MyChart Message (if you have MyChart) OR A paper copy in the mail If you have any lab test that is abnormal or we need to change your treatment, we will call you to review the results.   Testing/Procedures: Will be schedule at  [facility] has requested that you have cardiac CT. Cardiac computed tomography (CT) is a painless test that uses an x-ray machine to take clear, detailed pictures of your heart. For further information please visit [email or web address]. Please follow instruction sheet as given.     Follow-Up: At [facility], you and your health needs are our priority.  As part of our continuing mission to provide you with exceptional heart care, we have created designated Provider Care Teams.  These Care Teams include your primary Cardiologist (physician) and Advanced Practice Providers (APPs -  Physician Assistants and Nurse Practitioners) who all work together to provide you with the care you need, when you need it.     Your next appointment:   2 -3 weeks  after cardiac cath  The format for your next appointment:   In Person  Provider:    Dr Edson Snowball at [facility]   Other Instructions        Cardiac/Peripheral Catheterization   You are scheduled for a Cardiac Catheterization on Friday, October 13 with Dr. [name].  1. Please arrive at the Main Entrance A at [facility]: [place],  [address] on October 13 at 7:00 AM (This time is two hours before your procedure to ensure your preparation). Free valet parking service is available. You will check in at ADMITTING. The support person will be asked to wait in the waiting room.  It is OK to have someone drop  you off and come back when you are ready to be discharged.        Special note: Every effort is made to have your procedure done on time. Please understand that emergencies sometimes delay scheduled procedures.   . 2. Diet: Do not eat solid foods after midnight.  You may have clear liquids until 5 AM the day of the procedure.  3. Labs: You will need to have blood drawn  before or on Monday, October 9 at [place]  Open: [facility] (Lunch 12:30 - 1:30)   Phone: [phone]. You do not need to be fasting.  4. Medication instructions in preparation for your procedure:   Contrast Allergy: Yes, Please take Prednisone '50mg'$  by mouth at: Thirteen hours prior to cath 8:00pm on Thursday Seven hours prior to cath 2:00am on Friday And prior to leaving home please take last dose of Prednisone '50mg'$  and Benadryl '50mg'$  by mouth.  Stop taking, Lisinopril (Zestril or Prinivil) Friday, October 13,  Do not take your Trulicity on Sunday October 8,2023  Do not take Diabetes Med Glucophage (Metformin) on the day of the procedure and HOLD 48 HOURS AFTER THE PROCEDURE.  On the morning of your procedure, take Aspirin 81 mg and any morning medicines NOT listed above.  You may use sips of water.  5. Plan to go home the same day, you will only stay overnight if medically necessary. 6. You MUST have a responsible adult to drive you home. 7. An adult MUST be with you the first 24 hours after you arrive home. 8. Bring a current list of your medications, and the last time and date medication taken. 9. Bring ID and current insurance cards. 10.Please wear clothes that are easy to get on and off and wear slip-on shoes.  Thank you for allowing [place] to care for you!   -- [facility]     Leonie Man, MD, MS [name], M.D., M.S. Interventional Cardiologist  [place]  Pager # [phone] Phone # [phone] [address] [place], [place] [id number]   Thank you for choosing [facility]!!

## 2022-04-11 NOTE — Patient Instructions (Addendum)
Medication Instructions:  No changes  *If you need a refill on your cardiac medications before your next appointment, please call your pharmacy*   Lab Work: CBC BMP  If you have labs (blood work) drawn today and your tests are completely normal, you will receive your results only by: [place] (if you have MyChart) OR A paper copy in the mail If you have any lab test that is abnormal or we need to change your treatment, we will call you to review the results.   Testing/Procedures: Will be schedule at  [place] has requested that you have cardiac CT. Cardiac computed tomography (CT) is a painless test that uses an x-ray machine to take clear, detailed pictures of your heart. For further information please visit [email or web address]. Please follow instruction sheet as given.     Follow-Up: At [facility], you and your health needs are our priority.  As part of our continuing mission to provide you with exceptional heart care, we have created designated Provider Care Teams.  These Care Teams include your primary Cardiologist (physician) and Advanced Practice Providers (APPs -  Physician Assistants and Nurse Practitioners) who all work together to provide you with the care you need, when you need it.     Your next appointment:   2 -3 weeks  after cardiac cath  The format for your next appointment:   In Person  Provider:    Dr Edson Snowball at [facility]   Other Instructions        Cardiac/Peripheral Catheterization   You are scheduled for a Cardiac Catheterization on Friday, October 13 with Dr. [name].  1. Please arrive at the Main Entrance A at [facility]: [place], [place] [address] on October 13 at 7:00 AM (This time is two hours before your procedure to ensure your preparation). Free valet parking service is available. You will check in at ADMITTING. The support person will be asked to  wait in the waiting room.  It is OK to have someone drop you off and come back when you are ready to be discharged.        Special note: Every effort is made to have your procedure done on time. Please understand that emergencies sometimes delay scheduled procedures.   . 2. Diet: Do not eat solid foods after midnight.  You may have clear liquids until 5 AM the day of the procedure.  3. Labs: You will need to have blood drawn  before or on Monday, October 9 at [facility]  Open: [place] (Lunch 12:30 - 1:30)   Phone: [phone]. You do not need to be fasting.  4. Medication instructions in preparation for your procedure:   Contrast Allergy: Yes, Please take Prednisone '50mg'$  by mouth at: Thirteen hours prior to cath 8:00pm on Thursday Seven hours prior to cath 2:00am on Friday And prior to leaving home please take last dose of Prednisone '50mg'$  and Benadryl '50mg'$  by mouth.   Stop taking, Lisinopril (Zestril or Prinivil) Friday, October 13,  Do not take your Trulicity on Sunday October 8,2023  Do not take Diabetes Med Glucophage (Metformin) on the day of the procedure and HOLD 48 HOURS AFTER THE PROCEDURE.  On the morning of your procedure, take Aspirin 81 mg and any morning medicines NOT listed above.  You may use sips of water.  5. Plan to go home the same day,  you will only stay overnight if medically necessary. 6. You MUST have a responsible adult to drive you home. 7. An adult MUST be with you the first 24 hours after you arrive home. 8. Bring a current list of your medications, and the last time and date medication taken. 9. Bring ID and current insurance cards. 10.Please wear clothes that are easy to get on and off and wear slip-on shoes.  Thank you for allowing [place] to care for you!   -- [facility]

## 2022-04-11 NOTE — Progress Notes (Signed)
Primary Care Provider: [facility] Cardiologist: Roxan Diesel, MD [place] Interventional Cardiologist: [name], MD  Electrophysiologist: None  INTERVENTIONAL CARDIOLOGY CONSULT  Clinic Note: Chief Complaint  Patient presents with   New Patient (Initial Visit)    Evaluation of shortness of breath on exertion and chest pain; by report-abnormal stress test   ===================================  ASSESSMENT/PLAN   Problem List Items Addressed This Visit       Cardiology Problems   Hyperlipidemia associated with type 2 diabetes mellitus ([place]) (Chronic)    She is on rosuvastatin 40 mg daily.  I do not have labs from PCP.  She is on metformin and Trulicity        Relevant Medications   isosorbide mononitrate (IMDUR) 30 MG 24 hr tablet   Other Relevant Orders   EKG 12-Lead (Completed)   CBC (Completed)   Basic metabolic panel (Completed)   Atypical angina - Primary    Some of the features of chest pain are quite typical and that there associate with exertional dyspnea with chest tightness or pressure, but she also has a chest tightness or pressure at rest which is somewhat atypical.  Would suggest that her levels probably at least NYHA Class III based on her description -> this is despite being on metoprolol and Imdur. Palpitations seem to be controlled with propafenone.      Relevant Medications   isosorbide mononitrate (IMDUR) 30 MG 24 hr tablet   Other Relevant Orders   EKG 12-Lead (Completed)   CBC (Completed)   Basic metabolic panel (Completed)   DM type 2 causing vascular disease (HCC) (Chronic)   Relevant Medications   isosorbide mononitrate (IMDUR) 30 MG 24 hr tablet   Other Relevant Orders   EKG 12-Lead (Completed)   CBC (Completed)   Basic metabolic panel (Completed)   Essential hypertension, benign (Chronic)    Blood pressure seems pretty well controlled on current meds.  She is taking lisinopril 40 mg daily,  spironolactone 25 mg daily, and metoprolol succinate 100 mg daily. She also is on Imdur 30 mg.      Relevant Medications   isosorbide mononitrate (IMDUR) 30 MG 24 hr tablet     Other   Abnormal nuclear stress test (Chronic)    Unfortunately, I do not have the results of the test.  We are in the process to try to get them primer cardiologist.  Hopefully we will have them for a heart catheterization scheduled.  She has been referred because of the abnormal stress test to consider cardiac catheterization in a patient with risk factors and anginal symptoms.  For antianginal medications she is on Imdur and high-dose metoprolol with ongoing symptoms.  Talked about options going forward are either doing a noninvasive Coronary CTA versus going to cardiac catheterization for definitive evaluation.  Both options were discussed in detail and shared decision making, she decided to proceed with cardiac catheterization and possible PCI.  Shared Decision Making/Informed Consent The risks [stroke (1 in 1000), death (1 in 1000), kidney failure [usually temporary] (1 in 500), bleeding (1 in 200), allergic reaction [possibly serious] (1 in 200)], benefits (diagnostic support and management of coronary artery disease) and alternatives of a cardiac catheterization were discussed in detail with Ms. Bour and she is willing to proceed.      Relevant Orders   EKG 12-Lead (Completed)   CBC (Completed)   Basic metabolic panel (Completed)   Morbid (severe) obesity due to excess calories (HCC) (Chronic)  We did discuss need for dietary medication increase exercise.  Hopefully potentially take care of some anginal symptoms, she would be able to exercise more.  She needs to continue to have her thyroid adequately controlled.      Relevant Orders   EKG 12-Lead (Completed)   CBC (Completed)   Basic metabolic panel (Completed)    ===================================  HPI:    Lindsay Walsh is an obese 51  y.o. female former smoker with PMH notable for SLE and Polyarthralgia,  DM-2, history of Autoimmune Hepatitis and Stroke,, as well as some type of Arrhythmia on Propafenone who is being seen today for the evaluation of That and Shortness of Breath with Exertion-Abnormal Stress Test at the request of Tonia Ghent, MD.  Lindsay Walsh was seen by Dr. Tonia Ghent from [facility] and referred for evaluation.  Unfortunately I do not have his clinic note nor do I have the studies that were performed.  She was seen for evaluation of shortness of breath and off-and-on chest discomfort as well as dizziness.  Per report she had an echocardiogram and stress test done last month and was told they were abnormal.  Recent Hospitalizations:  None  Reviewed  CV studies:    The following studies were reviewed today: (if available, images/films reviewed: From Epic Chart or Care Everywhere) Echocardiogram and Nuclear Stress Test-reportedly abnormal stress test.  Reports incoming. By report PFTs normal. Lung cancer screening CT June 20 to 23: Negative screening for lung cancer.  Coronary artery calcifications noted.  Interval History:   Lindsay Walsh is a very pleasant 51 year old woman with a pretty complicated medical history that was referred for abnormal stress test performed to evaluate chest discomfort and exertional dyspnea.  She mentions that she gets short of breath with less and less activity now.  Definitely with increased level of activity she will then start noticing chest discomfort.  She says the chest comfort can be both at rest and with activity but it certainly will occur with increased level of activity.  Usually if she keeps on going beyond having some dyspnea, the chest discomfort comes on.  Most of the time it goes away after stopping to rest and catch her breath. She was having issues with palpitations apparently in the past and was started on a medicine that starts with a P  -- which I imagine is propafenone based on her med list.  She said that since starting the propafenone, that seems to have improved dramatically.  Did seeing that some of her chest discomfort symptoms were related to the palpitations but not all.  Because she is still having the chest pains/pressure.  She is obese and a former smoker who is quite deconditioned and therefore probably has plenty reason to have exertional dyspnea but it seems to have worsened when the chest discomfort became apparent.  Over the last few months things have gotten worse.  She denies any heart failure symptoms of PND, orthopnea and only has mild end of day swelling. Palpitations as mentioned seem to be better controlled.  No rapid irregular heartbeats.  No syncope or near syncope.  No TIA or amaurosis fugax.  No claudication.  REVIEWED OF SYSTEMS   Review of Systems  Constitutional:  Positive for malaise/fatigue. Negative for weight loss.  HENT:  Negative for congestion.   Respiratory:  Positive for shortness of breath (Per HPI). Negative for cough and wheezing.   Gastrointestinal:  Positive for heartburn. Negative for abdominal pain, blood  in stool, constipation and melena.  Genitourinary:  Negative for hematuria.  Musculoskeletal:  Positive for back pain, joint pain and myalgias. Negative for falls.       Has combination of fibromyalgia and polymyalgia.-Likely all related to SLE.  Neurological:  Positive for dizziness (Off-and-on dizziness).  Psychiatric/Behavioral:  Positive for depression. Negative for memory loss. The patient is nervous/anxious.        Seems to be pretty well controlled.    I have reviewed and (if needed) personally updated the patient's problem list, medications, allergies, past medical and surgical history, social and family history.   PAST MEDICAL HISTORY   Past Medical History:  Diagnosis Date   Acid reflux    Allergic rhinitis due to pollen    Anxiety    Arthritis    back    Autoimmune hepatitis ([place])    Back pain    Connective tissue disease ([place])    Depression    Dizziness and giddiness    Dyspareunia 05/06/2014   Dysrhythmia    palpatations; on propafenone   Essential (primary) hypertension    Fibromyalgia    Gastroesophageal reflux disease    Chronic abdominal pain; gastroparesis; globus hystericus; irritable bowel syndrome   Glaucoma    Hereditary and idiopathic neuropathy, unspecified    Hyperlipidemia    Irritable bowel syndrome    Major depressive disorder, single episode, unspecified    Morbid (severe) obesity due to excess calories (HCC)    Narcotic dependence ([place])    Nonspecific mesenteric lymphadenitis    Overweight(278.02)    Pain in limb    Pain in right foot    PTSD (post-traumatic stress disorder)    Pulmonary nodule    Restless legs syndrome    Sleep apnea    on CPAP machine   Stroke ([place]) 2016   Systemic lupus erythematosus (SLE) in adult [facility])    Tobacco abuse    one pack per day; 35 pack years; quit in February 2023   Type 2 diabetes mellitus with hyperglycemia ([place])     PAST SURGICAL HISTORY   Past Surgical History:  Procedure Laterality Date   ABDOMINAL HYSTERECTOMY     TAH&BSO   BACK SURGERY     BLADDER SUSPENSION  09/12/2011   Procedure: TRANSVAGINAL TAPE (TVT) PROCEDURE;  Surgeon: [name], MD;  Location: [facility];  Service: Urology;  Laterality: N/A;   CESAREAN SECTION     X3   CHOLECYSTECTOMY     ESOPHAGOGASTRODUODENOSCOPY ENDOSCOPY     "throat stretched" per pt.   LIVER BIOPSY     x4   LUMBAR EPIDURAL INJECTION  01/2012   TOTAL ABDOMINAL HYSTERECTOMY W/ BILATERAL SALPINGOOPHORECTOMY     TUBAL LIGATION     Bilateral   UPPER GASTROINTESTINAL ENDOSCOPY  09/13/2010    Immunization History  Administered Date(s) Administered   Influenza Inj Mdck Quad Pf 04/20/2016   Influenza Split 04/07/2013, 05/17/2017   Influenza,inj,Quad PF,6+ Mos 05/17/2017, 04/04/2018, 03/31/2019, 04/28/2019    Influenza-Unspecified 04/17/2012, 03/20/2014, 08/09/2014, 05/06/2015   Moderna Sars-Covid-2 Vaccination 02/06/2020, 03/05/2020   Pneumococcal Conjugate-13 12/18/2019   Pneumococcal Polysaccharide-23 04/20/2016    MEDICATIONS/ALLERGIES   Current Meds  Medication Sig   ACCU-CHEK GUIDE test strip USE TO CHECK BLOOD SUGAR FOUR TIMES DAILY   Accu-Chek Softclix Lancets lancets CHECK SUGAR ONCE DAILY   amphetamine-dextroamphetamine (ADDERALL) 20 MG tablet Take 1 tablet (20 mg total) by mouth 2 (two) times daily.   Ascorbic Acid (VITAMIN C) 1000 MG tablet Take 500 mg  by mouth every morning.    aspirin 325 MG EC tablet Take 325 mg by mouth daily.   azaTHIOprine (IMURAN) 50 MG tablet Take 1 tablet (50 mg total) by mouth daily.   bimatoprost (LUMIGAN) 0.01 % SOLN Place 1 drop into both eyes at bedtime.   calcium-vitamin D (OSCAL WITH D) 500-200 MG-UNIT tablet Take 1 tablet by mouth 2 (two) times daily.   cycloSPORINE (RESTASIS) 0.05 % ophthalmic emulsion Place 1 drop into both eyes daily.   dicyclomine (BENTYL) 10 MG capsule Take 1 capsule (10 mg total) by mouth 2 (two) times daily as needed.   Dulaglutide (TRULICITY) 3.38 SN/0.5LZ SOPN Inject 0.75 mg into the skin once a week.   DULoxetine (CYMBALTA) 60 MG capsule TAKE 1 CAPSULE(60 MG) BY MOUTH TWICE DAILY   esomeprazole (NEXIUM) 40 MG capsule TAKE 1 CAPSULE(40 MG) BY MOUTH EVERY MORNING   FARXIGA 10 MG TABS tablet Take 10 mg by mouth daily.   fexofenadine (ALLEGRA) 60 MG tablet Take 1 tablet by mouth 2 (two) times daily as needed for allergies.    gabapentin (NEURONTIN) 300 MG capsule Take 100 mg by mouth 3 (three) times daily.   Garlic 7673 MG CAPS Take by mouth.   HYDROcodone-acetaminophen (NORCO/VICODIN) 5-325 MG tablet Take 1 tablet by mouth every 6 (six) hours as needed for moderate pain. 0.5 -1 po bid prn   hydroxychloroquine (PLAQUENIL) 200 MG tablet Take 1 tablet (200 mg total) by mouth 2 (two) times daily.   isosorbide mononitrate  (IMDUR) 30 MG 24 hr tablet Take 30 mg by mouth every morning.   lisinopril (ZESTRIL) 40 MG tablet TAKE 1 TABLET(40 MG) BY MOUTH DAILY   meclizine (ANTIVERT) 12.5 MG tablet Take 1 tablet (12.5 mg total) by mouth 3 (three) times daily as needed for dizziness.   metFORMIN (GLUCOPHAGE) 1000 MG tablet Take 1 tablet (1,000 mg total) by mouth 2 (two) times daily with a meal.   methocarbamol (ROBAXIN) 500 MG tablet Take 500 mg by mouth in the morning and at bedtime. Prn.   metoprolol succinate (TOPROL-XL) 100 MG 24 hr tablet TAKE 1 TABLET(100 MG) BY MOUTH DAILY WITH FOOD   mirabegron ER (MYRBETRIQ) 50 MG TB24 tablet Take 1 tablet (50 mg total) by mouth daily.   Multiple Vitamins-Minerals (CENTRUM) tablet Take 1 tablet by mouth daily.   nitroGLYCERIN (NITROLINGUAL) 0.4 MG/SPRAY spray Place under the tongue.   NP THYROID 60 MG tablet TAKE 1 TABLET(60 MG) BY MOUTH DAILY BEFORE BREAKFAST   ondansetron (ZOFRAN) 4 MG tablet Take 1 tablet (4 mg total) by mouth every 8 (eight) hours as needed for nausea or vomiting.   pramipexole (MIRAPEX) 1 MG tablet Take 1 mg by mouth daily.   prazosin (MINIPRESS) 2 MG capsule TAKE 1 CAPSULE(2 MG) BY MOUTH AT BEDTIME   predniSONE (DELTASONE) 50 MG tablet Take 50 mg tablet at 8pm 04/20/22, take 50 mg  at 2 am on 04/21/22 and 50 mg at 7 am on 04/21/22 with 50 mg  benadryl   PRESCRIPTION MEDICATION Take 1 tablet by mouth daily. Take 1 tablet by mouth daily before food and at bedtime.   This is MOTILLIUM ( DOMPERIDONE 10 mg) Tablet   PREVIDENT 5000 PLUS 1.1 % CREA dental cream Take by mouth every morning.   Probiotic Product (RESTORA PO) Take by mouth daily.    propafenone (RYTHMOL) 150 MG tablet Take 150 mg by mouth 3 (three) times daily.   rosuvastatin (CRESTOR) 40 MG tablet Take 40 mg by mouth  daily.   spironolactone (ALDACTONE) 25 MG tablet Take 25 mg by mouth daily.   TRULICITY 1.5 ZD/6.3OV SOPN Inject 1.5 mg into the skin once a week.    Allergies  Allergen Reactions    Doxycycline Shortness Of Breath and Rash   Fentanyl Shortness Of Breath, Itching and Other (See Comments)    Heart racing, SOB   Amlodipine     Unknown reaction   Bupropion     Unknown reaction   Cefoxitin     Unknown reaction   Ceftin [Cefuroxime Axetil]     Unknown reaction   Iodinated Contrast Media Other (See Comments)    Knots on body   Iohexol Other (See Comments)    Knots on body    Norvasc [Amlodipine Besylate]    Wellbutrin [Bupropion Hcl] Other (See Comments)    unknown   Latex Rash   Tape Rash    SOCIAL HISTORY/FAMILY HISTORY   Reviewed in Epic:   Social History   Tobacco Use   Smoking status: Former    Packs/day: 1.50    Years: 25.00    Total pack years: 37.50    Types: Cigarettes    Quit date: 04/25/2013    Years since quitting: 8.9   Smokeless tobacco: Never  Vaping Use   Vaping Use: Some days  Substance Use Topics   Alcohol use: No   Drug use: No   Social History   Social History Narrative   Divorced since 15.Lives with boyfriend of 11 years.On disability.   Family History  Problem Relation Age of Onset   Diabetes Mother    Hypertension Mother    Anxiety disorder Mother    Depression Mother    Drug abuse Mother    Heart disease Father    Diabetes Father    Anxiety disorder Father    Alcohol abuse Father    Depression Father    OCD Father    Thyroid disease Sister    Healthy Brother    Healthy Daughter    Healthy Son    ADD / ADHD Son    Alcohol abuse Paternal Grandfather    Depression Paternal Grandfather    Cancer Paternal Grandfather        lung,skin   Tuberculosis Paternal Grandfather    Seizures Paternal Grandmother    Lupus Paternal Grandmother    ADD / ADHD Son    Anesthesia problems Neg Hx    Malignant hyperthermia Neg Hx    Pseudochol deficiency Neg Hx    Hypotension Neg Hx    Bipolar disorder Neg Hx    Dementia Neg Hx    Paranoid behavior Neg Hx    Schizophrenia Neg Hx    Sexual abuse Neg Hx    Physical  abuse Neg Hx     OBJCTIVE -PE, EKG, labs   Wt Readings from Last 3 Encounters:  04/11/22 194 lb 3.2 oz (88.1 kg)  03/20/22 199 lb 9.6 oz (90.5 kg)  11/16/21 198 lb (89.8 kg)    Physical Exam: BP 112/80   Pulse (!) 102   Ht 5' (1.524 m)   Wt 194 lb 3.2 oz (88.1 kg)   SpO2 97%   BMI 37.93 kg/m  Physical Exam Vitals reviewed.  Constitutional:      General: She is not in acute distress.    Appearance: Normal appearance. She is obese. She is not ill-appearing or toxic-appearing.     Comments: Well-groomed.  HENT:     Head: Normocephalic and atraumatic.  Eyes:     Extraocular Movements: Extraocular movements intact.     Pupils: Pupils are equal, round, and reactive to light.  Neck:     Vascular: No carotid bruit (Difficult to assess) or JVD.  Cardiovascular:     Rate and Rhythm: Regular rhythm. Tachycardia present. No extrasystoles are present.    Chest Wall: PMI is not displaced (Difficult to palpate).     Pulses: No decreased pulses.     Heart sounds: S1 normal and S2 normal. Heart sounds are distant. No murmur heard.    No friction rub. No gallop.  Pulmonary:     Effort: Pulmonary effort is normal. No respiratory distress.     Breath sounds: Normal breath sounds. No wheezing, rhonchi or rales.     Comments: Distant breath sounds. Chest:     Chest wall: No tenderness.  Musculoskeletal:        General: Swelling (At least 1+ bilateral ankle) present.     Cervical back: Normal range of motion and neck supple.  Skin:    General: Skin is warm and dry.  Neurological:     General: No focal deficit present.     Mental Status: She is alert and oriented to person, place, and time.     Gait: Gait normal.  Psychiatric:        Mood and Affect: Mood normal.        Behavior: Behavior normal.        Thought Content: Thought content normal.        Judgment: Judgment normal.     Comments: Very anxious.     Adult ECG Report  Rate: 102 ;  Rhythm: sinus tachycardia and otherwise  normal axis, intervals and durations. ;   Narrative Interpretation: Stable  Recent Labs: Recent labs not available Lab Results  Component Value Date   CHOL 153 04/25/2019   HDL 37 04/25/2019   LDLCALC 91 04/25/2019   TRIG 141 04/25/2019   CHOLHDL 4.9 03/06/2015   Lab Results  Component Value Date   CREATININE 1.14 (H) 04/13/2022   BUN 16 04/13/2022   NA 136 04/13/2022   K 5.3 (H) 04/13/2022   CL 99 04/13/2022   CO2 22 04/13/2022      Latest Ref Rng & Units 04/13/2022   12:27 PM 01/25/2020    8:25 PM 10/20/2019   10:30 AM  CBC  WBC 3.4 - 10.8 x10E3/uL 12.7  11.8  9.0   Hemoglobin 11.1 - 15.9 g/dL 13.2  14.5  12.7   Hematocrit 34.0 - 46.6 % 38.9  43.2  37.8   Platelets 150 - 450 x10E3/uL 380  316  300     Lab Results  Component Value Date   HGBA1C 11.4 (H) 08/03/2020   Lab Results  Component Value Date   TSH 2.79 08/03/2020    ================================================== I spent a total of 35  minutes with the patient spent in direct patient consultation.  Additional time spent with chart review  / charting (studies, outside notes, etc): 46 min Total Time: 81 min  Current medicines are reviewed at length with the patient today.  (+/- concerns) none   Notice: This dictation was prepared with Dragon dictation along with smart phrase technology. Any transcriptional errors that result from this process are unintentional and may not be corrected upon review.   Studies Ordered:  Orders Placed This Encounter  Procedures   CBC   Basic metabolic panel   EKG 85-OYDX   Meds ordered this  encounter  Medications   predniSONE (DELTASONE) 50 MG tablet    Sig: Take 50 mg tablet at 8pm 04/20/22, take 50 mg  at 2 am on 04/21/22 and 50 mg at 7 am on 04/21/22 with 50 mg  benadryl    Dispense:  3 tablet    Refill:  0    Patient Instructions / Medication Changes & Studies & Tests Ordered   Patient Instructions  Medication Instructions:  No changes  *If you need a  refill on your cardiac medications before your next appointment, please call your pharmacy*   Lab Work: CBC BMP  If you have labs (blood work) drawn today and your tests are completely normal, you will receive your results only by: MyChart Message (if you have MyChart) OR A paper copy in the mail If you have any lab test that is abnormal or we need to change your treatment, we will call you to review the results.   Testing/Procedures: Will be schedule at  [place] has requested that you have cardiac CT. Cardiac computed tomography (CT) is a painless test that uses an x-ray machine to take clear, detailed pictures of your heart. For further information please visit [email or web address][facility][email or web address]. Please follow instruction sheet as given.     Follow-Up: At [facility], you and your health needs are our priority.  As part of our continuing mission to provide you with exceptional heart care, we have created designated Provider Care Teams.  These Care Teams include your primary Cardiologist (physician) and Advanced Practice Providers (APPs -  Physician Assistants and Nurse Practitioners) who all work together to provide you with the care you need, when you need it.     Your next appointment:   2 -3 weeks  after cardiac cath  The format for your next appointment:   In Person  Provider:    Dr Edson Snowball at [facility]   Other Instructions        Cardiac/Peripheral Catheterization   You are scheduled for a Cardiac Catheterization on Friday, October 13 with Dr. [name].  1. Please arrive at the Main Entrance A at [facility]: [place], [place] [address] on October 13 at 7:00 AM (This time is two hours before your procedure to ensure your preparation). Free valet parking service is available. You will check in at ADMITTING. The support person will be asked to wait in the waiting room.  It is OK to have someone drop  you off and come back when you are ready to be discharged.        Special note: Every effort is made to have your procedure done on time. Please understand that emergencies sometimes delay scheduled procedures.   . 2. Diet: Do not eat solid foods after midnight.  You may have clear liquids until 5 AM the day of the procedure.  3. Labs: You will need to have blood drawn  before or on Monday, October 9 at [place]  Open: [place] (Lunch 12:30 - 1:30)   Phone: [phone]. You do not need to be fasting.  4. Medication instructions in preparation for your procedure:   Contrast Allergy: Yes, Please take Prednisone '50mg'$  by mouth at: Thirteen hours prior to cath 8:00pm on Thursday Seven hours prior to cath 2:00am on Friday And prior to leaving home please take last dose of Prednisone '50mg'$  and Benadryl '50mg'$  by mouth.  Stop taking, Lisinopril (Zestril or Prinivil) Friday, October 13,  Do not take your Trulicity on Sunday October 8,2023  Do not take Diabetes Med Glucophage (Metformin) on the day of the procedure and HOLD 48 HOURS AFTER THE PROCEDURE.  On the morning of your procedure, take Aspirin 81 mg and any morning medicines NOT listed above.  You may use sips of water.  5. Plan to go home the same day, you will only stay overnight if medically necessary. 6. You MUST have a responsible adult to drive you home. 7. An adult MUST be with you the first 24 hours after you arrive home. 8. Bring a current list of your medications, and the last time and date medication taken. 9. Bring ID and current insurance cards. 10.Please wear clothes that are easy to get on and off and wear slip-on shoes.  Thank you for allowing [place] to care for you!   -- [facility]     Leonie Man, MD, MS [name], M.D., M.S. Interventional Cardiologist  [place]  Pager # [phone] Phone # [phone] [address]. [place], [place] [address]   Thank you for choosing [facility] [place]!!

## 2022-04-14 ENCOUNTER — Encounter: Payer: Self-pay | Admitting: Cardiology

## 2022-04-14 LAB — BASIC METABOLIC PANEL
BUN/Creatinine Ratio: 14 (ref 9–23)
BUN: 16 mg/dL (ref 6–24)
CO2: 22 mmol/L (ref 20–29)
Calcium: 9.4 mg/dL (ref 8.7–10.2)
Chloride: 99 mmol/L (ref 96–106)
Creatinine, Ser: 1.14 mg/dL — ABNORMAL HIGH (ref 0.57–1.00)
Glucose: 143 mg/dL — ABNORMAL HIGH (ref 70–99)
Potassium: 5.3 mmol/L — ABNORMAL HIGH (ref 3.5–5.2)
Sodium: 136 mmol/L (ref 134–144)
eGFR: 58 mL/min/{1.73_m2} — ABNORMAL LOW (ref >59)

## 2022-04-14 LAB — CBC
Hematocrit: 38.9 % (ref 34.0–46.6)
Hemoglobin: 13.2 g/dL (ref 11.1–15.9)
MCH: 30.8 pg (ref 26.6–33.0)
MCHC: 33.9 g/dL (ref 31.5–35.7)
MCV: 91 fL (ref 79–97)
Platelets: 380 10*3/uL (ref 150–450)
RBC: 4.28 x10E6/uL (ref 3.77–5.28)
RDW: 12.3 % (ref 11.7–15.4)
WBC: 12.7 10*3/uL — ABNORMAL HIGH (ref 3.4–10.8)

## 2022-04-14 NOTE — Assessment & Plan Note (Addendum)
Some of the features of chest pain are quite typical and that there associate with exertional dyspnea with chest tightness or pressure, but she also has a chest tightness or pressure at rest which is somewhat atypical.  Would suggest that her levels probably at least NYHA Class III based on her description -> this is despite being on metoprolol and Imdur. Palpitations seem to be controlled with propafenone.

## 2022-04-14 NOTE — Assessment & Plan Note (Addendum)
Unfortunately, I do not have the results of the test.  We are in the process to try to get them primer cardiologist.  Hopefully we will have them for a heart catheterization scheduled.  She has been referred because of the abnormal stress test to consider cardiac catheterization in a patient with risk factors and anginal symptoms.  For antianginal medications she is on Imdur and high-dose metoprolol with ongoing symptoms.  Talked about options going forward are either doing a noninvasive Coronary CTA versus going to cardiac catheterization for definitive evaluation.  Both options were discussed in detail and shared decision making, she decided to proceed with cardiac catheterization and possible PCI.  Shared Decision Making/Informed Consent The risks [stroke (1 in 1000), death (1 in 1000), kidney failure [usually temporary] (1 in 500), bleeding (1 in 200), allergic reaction [possibly serious] (1 in 200)], benefits (diagnostic support and management of coronary artery disease) and alternatives of a cardiac catheterization were discussed in detail with Lindsay Walsh and she is willing to proceed.

## 2022-04-14 NOTE — Assessment & Plan Note (Addendum)
Blood pressure seems pretty well controlled on current meds.  She is taking lisinopril 40 mg daily, spironolactone 25 mg daily, and metoprolol succinate 100 mg daily. She also is on Imdur 30 mg.

## 2022-04-14 NOTE — Assessment & Plan Note (Signed)
We did discuss need for dietary medication increase exercise.  Hopefully potentially take care of some anginal symptoms, she would be able to exercise more.  She needs to continue to have her thyroid adequately controlled.

## 2022-04-14 NOTE — Assessment & Plan Note (Addendum)
She is on rosuvastatin 40 mg daily.  I do not have labs from PCP.  She is on metformin and Trulicity

## 2022-04-18 ENCOUNTER — Other Ambulatory Visit: Payer: Self-pay | Admitting: *Deleted

## 2022-04-18 DIAGNOSIS — R9439 Abnormal result of other cardiovascular function study: Secondary | ICD-10-CM

## 2022-04-18 DIAGNOSIS — R899 Unspecified abnormal finding in specimens from other organs, systems and tissues: Secondary | ICD-10-CM

## 2022-04-20 ENCOUNTER — Telehealth: Payer: Self-pay | Admitting: *Deleted

## 2022-04-20 NOTE — Telephone Encounter (Signed)
I received message from RN [facility] that Dr Ellyn Hack had received copy of stress test report from Dr Tonia Ghent.

## 2022-04-20 NOTE — Telephone Encounter (Signed)
Cardiac Catheterization scheduled at [facility] for: Friday April 21, 2022 9 AM Arrival time and place: [facility] Main Entrance A at: 6:30 AM-needs BMP   Nothing to eat after midnight prior to procedure, clear liquids until 5 AM day of procedure.  CONTRAST ALLERGY: yes-13 hour Prednisone and Benadryl Prep reviewed with patient: 04/20/22 Prednisone 50 mg 8 PM 10/13 23 Prednisone 50 mg 2 AM 04/21/22 Prednisone 50 mg and Benadryl 50 mg just prior to leaving home for hospital. Patient advised not to drive.  Medication instructions: -Hold:   Metformin-day of procedure and 48 hours post procedure  Farixga-AM of procedure  Trulicity-weekly on Sundays-pt has not taken  Spironlactone-AM of procedure -Except hold medications usual morning medications can be taken with sips of water including aspirin 81 mg.  Confirmed patient has responsible adult to drive home post procedure and be with patient first 24 hours after arriving home.  Patient reports no new symptoms concerning for COVID-19 in the past 10 days.  Reviewed procedure instructions with patient.

## 2022-04-21 ENCOUNTER — Ambulatory Visit (HOSPITAL_COMMUNITY)
Admission: RE | Admit: 2022-04-21 | Discharge: 2022-04-21 | Disposition: A | Discharge status: Home / Self Care | Payer: Medicare Other | Attending: Cardiology | Admitting: Cardiology

## 2022-04-21 ENCOUNTER — Other Ambulatory Visit: Payer: Self-pay

## 2022-04-21 ENCOUNTER — Ambulatory Visit (HOSPITAL_COMMUNITY)
Admission: RE | Disposition: A | Discharge status: Home / Self Care | Payer: Medicare Other | Source: Home / Self Care | Attending: Cardiology

## 2022-04-21 ENCOUNTER — Encounter (HOSPITAL_COMMUNITY): Payer: Self-pay | Admitting: Cardiology

## 2022-04-21 DIAGNOSIS — E785 Hyperlipidemia, unspecified: Secondary | ICD-10-CM | POA: Insufficient documentation

## 2022-04-21 DIAGNOSIS — Z01812 Encounter for preprocedural laboratory examination: Secondary | ICD-10-CM

## 2022-04-21 DIAGNOSIS — I2510 Atherosclerotic heart disease of native coronary artery without angina pectoris: Secondary | ICD-10-CM

## 2022-04-21 DIAGNOSIS — I200 Unstable angina: Secondary | ICD-10-CM

## 2022-04-21 DIAGNOSIS — I25110 Atherosclerotic heart disease of native coronary artery with unstable angina pectoris: Secondary | ICD-10-CM | POA: Diagnosis present

## 2022-04-21 DIAGNOSIS — Z6837 Body mass index (BMI) 37.0-37.9, adult: Secondary | ICD-10-CM | POA: Insufficient documentation

## 2022-04-21 DIAGNOSIS — I2584 Coronary atherosclerosis due to calcified coronary lesion: Secondary | ICD-10-CM | POA: Diagnosis not present

## 2022-04-21 DIAGNOSIS — I10 Essential (primary) hypertension: Secondary | ICD-10-CM | POA: Diagnosis not present

## 2022-04-21 DIAGNOSIS — E1151 Type 2 diabetes mellitus with diabetic peripheral angiopathy without gangrene: Secondary | ICD-10-CM | POA: Insufficient documentation

## 2022-04-21 DIAGNOSIS — Z79899 Other long term (current) drug therapy: Secondary | ICD-10-CM | POA: Insufficient documentation

## 2022-04-21 DIAGNOSIS — R0602 Shortness of breath: Secondary | ICD-10-CM | POA: Insufficient documentation

## 2022-04-21 DIAGNOSIS — Z87891 Personal history of nicotine dependence: Secondary | ICD-10-CM | POA: Insufficient documentation

## 2022-04-21 DIAGNOSIS — R000 Tachycardia, unspecified: Secondary | ICD-10-CM | POA: Insufficient documentation

## 2022-04-21 DIAGNOSIS — Z7985 Long-term (current) use of injectable non-insulin antidiabetic drugs: Secondary | ICD-10-CM | POA: Insufficient documentation

## 2022-04-21 DIAGNOSIS — Z9289 Personal history of other medical treatment: Secondary | ICD-10-CM

## 2022-04-21 DIAGNOSIS — Z7984 Long term (current) use of oral hypoglycemic drugs: Secondary | ICD-10-CM | POA: Diagnosis not present

## 2022-04-21 HISTORY — PX: LEFT HEART CATH AND CORONARY ANGIOGRAPHY: CATH118249

## 2022-04-21 HISTORY — PX: CORONARY PRESSURE/FFR STUDY: CATH118243

## 2022-04-21 LAB — BASIC METABOLIC PANEL
Anion gap: 7 (ref 5–15)
BUN: 15 mg/dL (ref 6–20)
CO2: 23 mmol/L (ref 22–32)
Calcium: 9 mg/dL (ref 8.9–10.3)
Chloride: 103 mmol/L (ref 98–111)
Creatinine, Ser: 1.08 mg/dL — ABNORMAL HIGH (ref 0.44–1.00)
GFR, Estimated: >60 mL/min (ref >60)
Glucose, Bld: 230 mg/dL — ABNORMAL HIGH (ref 70–99)
Potassium: 5.1 mmol/L (ref 3.5–5.1)
Sodium: 133 mmol/L — ABNORMAL LOW (ref 135–145)

## 2022-04-21 LAB — POCT ACTIVATED CLOTTING TIME: Activated Clotting Time: 281 seconds

## 2022-04-21 LAB — GLUCOSE, CAPILLARY: Glucose-Capillary: 222 mg/dL — ABNORMAL HIGH (ref 70–99)

## 2022-04-21 SURGERY — LEFT HEART CATH AND CORONARY ANGIOGRAPHY
Anesthesia: LOCAL

## 2022-04-21 MED ORDER — ADENOSINE (DIAGNOSTIC) 140MCG/KG/MIN
INTRAVENOUS | Status: DC | PRN
  Administered 2022-04-21: 140 ug/kg/min via INTRAVENOUS

## 2022-04-21 MED ORDER — METOPROLOL TARTRATE 5 MG/5ML IV SOLN
INTRAVENOUS | Status: AC
  Filled 2022-04-21: qty 5

## 2022-04-21 MED ORDER — HYDRALAZINE HCL 20 MG/ML IJ SOLN: 10.0000 mg | INTRAMUSCULAR | Status: DC | PRN

## 2022-04-21 MED ORDER — SODIUM CHLORIDE 0.9 % IV SOLN: 250.0000 mL | INTRAVENOUS | Status: DC | PRN

## 2022-04-21 MED ORDER — MIDAZOLAM HCL 2 MG/2ML IJ SOLN
INTRAMUSCULAR | Status: AC
  Filled 2022-04-21: qty 2

## 2022-04-21 MED ORDER — SODIUM CHLORIDE 0.9 % WEIGHT BASED INFUSION: 1.0000 mL/kg/h | INTRAVENOUS | Status: DC

## 2022-04-21 MED ORDER — ACETAMINOPHEN 325 MG PO TABS: 650.0000 mg | ORAL_TABLET | ORAL | Status: DC | PRN

## 2022-04-21 MED ORDER — SODIUM CHLORIDE 0.9% FLUSH: 3.0000 mL | Freq: Two times a day (BID) | INTRAVENOUS | Status: DC

## 2022-04-21 MED ORDER — SODIUM CHLORIDE 0.9 % WEIGHT BASED INFUSION
3.0000 mL/kg/h | INTRAVENOUS | Status: AC
  Administered 2022-04-21: 3 mL/kg/h via INTRAVENOUS

## 2022-04-21 MED ORDER — ADENOSINE 12 MG/4ML IV SOLN
INTRAVENOUS | Status: AC
  Filled 2022-04-21: qty 16

## 2022-04-21 MED ORDER — IOHEXOL 350 MG/ML SOLN
INTRAVENOUS | Status: DC | PRN
  Administered 2022-04-21: 85 mL via INTRA_ARTERIAL

## 2022-04-21 MED ORDER — METOPROLOL TARTRATE 5 MG/5ML IV SOLN
INTRAVENOUS | Status: DC | PRN
  Administered 2022-04-21: 2.5 mg via INTRAVENOUS

## 2022-04-21 MED ORDER — HEPARIN (PORCINE) IN NACL 1000-0.9 UT/500ML-% IV SOLN
INTRAVENOUS | Status: AC
  Filled 2022-04-21: qty 1000

## 2022-04-21 MED ORDER — SODIUM CHLORIDE 0.9% FLUSH
3.0000 mL | INTRAVENOUS | Status: DC | PRN
  Administered 2022-04-21: 3 mL via INTRAVENOUS

## 2022-04-21 MED ORDER — LABETALOL HCL 5 MG/ML IV SOLN: 10.0000 mg | INTRAVENOUS | Status: DC | PRN

## 2022-04-21 MED ORDER — HEPARIN SODIUM (PORCINE) 1000 UNIT/ML IJ SOLN
INTRAMUSCULAR | Status: DC | PRN
  Administered 2022-04-21: 4000 [IU] via INTRAVENOUS
  Administered 2022-04-21: 5000 [IU] via INTRAVENOUS

## 2022-04-21 MED ORDER — SODIUM CHLORIDE 0.9 % IV SOLN
INTRAVENOUS | Status: DC | PRN
  Administered 2022-04-21: 250 mL via INTRAVENOUS

## 2022-04-21 MED ORDER — VERAPAMIL HCL 2.5 MG/ML IV SOLN
INTRAVENOUS | Status: DC | PRN
  Administered 2022-04-21: 10 mL via INTRA_ARTERIAL

## 2022-04-21 MED ORDER — HEPARIN SODIUM (PORCINE) 1000 UNIT/ML IJ SOLN
INTRAMUSCULAR | Status: AC
  Filled 2022-04-21: qty 10

## 2022-04-21 MED ORDER — SODIUM CHLORIDE 0.9% FLUSH: 3.0000 mL | INTRAVENOUS | Status: DC | PRN

## 2022-04-21 MED ORDER — VERAPAMIL HCL 2.5 MG/ML IV SOLN
INTRAVENOUS | Status: AC
  Filled 2022-04-21: qty 2

## 2022-04-21 MED ORDER — ASPIRIN 81 MG PO CHEW
81.0000 mg | CHEWABLE_TABLET | ORAL | Status: AC
  Administered 2022-04-21: 81 mg via ORAL

## 2022-04-21 MED ORDER — ONDANSETRON HCL 4 MG/2ML IJ SOLN: 4.0000 mg | Freq: Four times a day (QID) | INTRAMUSCULAR | Status: DC | PRN

## 2022-04-21 MED ORDER — HEPARIN (PORCINE) IN NACL 1000-0.9 UT/500ML-% IV SOLN
INTRAVENOUS | Status: DC | PRN
  Administered 2022-04-21 (×2): 500 mL

## 2022-04-21 MED ORDER — MIDAZOLAM HCL 2 MG/2ML IJ SOLN
INTRAMUSCULAR | Status: DC | PRN
  Administered 2022-04-21: 2 mg via INTRAVENOUS

## 2022-04-21 MED ORDER — LIDOCAINE HCL (PF) 1 % IJ SOLN
INTRAMUSCULAR | Status: AC
  Filled 2022-04-21: qty 30

## 2022-04-21 SURGICAL SUPPLY — 13 items
CATH OPTITORQUE TIG 4.0 5F (CATHETERS) IMPLANT
CATH VISTA GUIDE 6FR XB3.5 (CATHETERS) IMPLANT
DEVICE RAD COMP TR BAND LRG (VASCULAR PRODUCTS) IMPLANT
GLIDESHEATH SLEND SS 6F .021 (SHEATH) IMPLANT
GUIDEWIRE INQWIRE 1.5J.035X260 (WIRE) IMPLANT
GUIDEWIRE PRESSURE X 175 (WIRE) IMPLANT
INQWIRE 1.5J .035X260CM (WIRE) ×1
KIT ENCORE 26 ADVANTAGE (KITS) IMPLANT
KIT HEART LEFT (KITS) ×2 IMPLANT
PACK CARDIAC CATHETERIZATION (CUSTOM PROCEDURE TRAY) ×2 IMPLANT
SYR MEDRAD MARK 7 150ML (SYRINGE) ×2 IMPLANT
TRANSDUCER W/STOPCOCK (MISCELLANEOUS) ×2 IMPLANT
TUBING CIL FLEX 10 FLL-RA (TUBING) ×2 IMPLANT

## 2022-04-21 NOTE — Progress Notes (Signed)
Patient and husband was given discharge instructions. Both verbalized understanding. 

## 2022-04-21 NOTE — Discharge Instructions (Signed)

## 2022-04-21 NOTE — Interval H&P Note (Signed)
History and Physical Interval Note:  04/21/2022 9:46 AM  Lindsay Walsh  has presented today for surgery, with the diagnosis of Continued Progressive Angina with NORMAL NUCLEAR STRESS TEST but HEAVY COROANRY CALCIFICATION ON CT.  The various methods of treatment have been discussed with the patient and family. After consideration of risks, benefits and other options for treatment, the patient has consented to  Procedure(s): LEFT HEART CATH AND CORONARY ANGIOGRAPHY (N/A)  PERCUTANEOUS CORONARY INTERVENTION  as a surgical intervention.  The patient's history has been reviewed, patient examined, no change in status, stable for surgery.  I have reviewed the patient's chart and labs.  Questions were answered to the patient's satisfaction.    Cath Lab Visit (complete for each Cath Lab visit)  Clinical Evaluation Leading to the Procedure:   ACS: No.  Non-ACS:    Anginal Classification: CCS III  Anti-ischemic medical therapy: Minimal Therapy (1 class of medications)  Non-Invasive Test Results: Equivocal test results - CONTINUED ANGINAL Sx with NORMAL (initially reported as Positive - incorrectly) STRESS TEST, but significant Coronary Calcification on CT.  Prior CABG: No previous CABG   Lindsay Walsh

## 2022-04-26 ENCOUNTER — Other Ambulatory Visit (INDEPENDENT_AMBULATORY_CARE_PROVIDER_SITE_OTHER): Payer: Self-pay | Admitting: Internal Medicine

## 2022-04-26 MED FILL — Lidocaine HCl Local Preservative Free (PF) Inj 1%: INTRAMUSCULAR | Qty: 30 | Status: AC

## 2022-04-26 NOTE — Telephone Encounter (Signed)
Last seen 11/15/2021 [name], Next 05/22/2022 at 2:45 PM Dr. Jenetta Downer

## 2022-05-16 ENCOUNTER — Other Ambulatory Visit (HOSPITAL_COMMUNITY): Payer: Self-pay | Admitting: Psychiatry

## 2022-05-16 DIAGNOSIS — F4312 Post-traumatic stress disorder, chronic: Secondary | ICD-10-CM

## 2022-05-17 ENCOUNTER — Encounter (HOSPITAL_COMMUNITY): Payer: Self-pay | Admitting: Psychiatry

## 2022-05-17 ENCOUNTER — Telehealth (INDEPENDENT_AMBULATORY_CARE_PROVIDER_SITE_OTHER): Payer: Medicare Other | Admitting: Psychiatry

## 2022-05-17 DIAGNOSIS — F515 Nightmare disorder: Secondary | ICD-10-CM

## 2022-05-17 DIAGNOSIS — F4312 Post-traumatic stress disorder, chronic: Secondary | ICD-10-CM

## 2022-05-17 DIAGNOSIS — F900 Attention-deficit hyperactivity disorder, predominantly inattentive type: Secondary | ICD-10-CM

## 2022-05-17 DIAGNOSIS — F4310 Post-traumatic stress disorder, unspecified: Secondary | ICD-10-CM

## 2022-05-17 MED ORDER — PRAZOSIN HCL 2 MG PO CAPS: ORAL_CAPSULE | ORAL | 2 refills | Status: DC

## 2022-05-17 MED ORDER — AMPHETAMINE-DEXTROAMPHETAMINE 20 MG PO TABS: 20.0000 mg | ORAL_TABLET | Freq: Two times a day (BID) | ORAL | 0 refills | Status: DC

## 2022-05-17 MED ORDER — DULOXETINE HCL 60 MG PO CPEP: ORAL_CAPSULE | ORAL | 2 refills | Status: DC

## 2022-05-17 NOTE — Progress Notes (Signed)
Virtual Visit via Telephone Note  I connected with Lindsay Walsh on 05/17/22 at  3:00 PM EST by telephone and verified that I am speaking with the correct person using two identifiers.  Location: Patient: home Provider: office   I discussed the limitations, risks, security and privacy concerns of performing an evaluation and management service by telephone and the availability of in person appointments. I also discussed with the patient that there may be a patient responsible charge related to this service. The patient expressed understanding and agreed to proceed.     I discussed the assessment and treatment plan with the patient. The patient was provided an opportunity to ask questions and all were answered. The patient agreed with the plan and demonstrated an understanding of the instructions.   The patient was advised to call back or seek an in-person evaluation if the symptoms worsen or if the condition fails to improve as anticipated.  I provided 15 minutes of non-face-to-face time during this encounter.   Lindsay Spiller, MD  [facility] MD/PA/NP OP Progress Note  05/17/2022 3:19 PM Lindsay Walsh Lindsay Walsh  MRN:  [id number]  Chief Complaint:  Chief Complaint  Patient presents with   Depression   Anxiety   Follow-up   HPI: This patient is a 51 year old divorced white female who lives with her boyfriend in [place]. She is on disability for an autoimmune liver disease. She has 3 children and one granddaughter   The patient returns for follow-up after 3 months.  She is still frustrated with her health issues.  She recently had to have cardiac catheterization no stent was put in but she claims she has to take some IM treatments in her legs.  Its not clear from the chart what this is about.  Prior to that she had been diagnosed with lupus.  Last time we tried to add Trintellix to help her depression but it made her very shaky and she stopped it.  For now she thinks she is doing okay with the  Cymbalta.  She is sleeping well and no longer having nightmares.  She does think the Adderall continues to help with her focus and alertness.  She denies serious depression thoughts of self-harm or suicide Visit Diagnosis:    ICD-10-CM   1. PTSD (post-traumatic stress disorder)  F43.10     2. Nightmares associated with chronic post-traumatic stress disorder  F51.5 prazosin (MINIPRESS) 2 MG capsule   F43.12     3. Attention deficit hyperactivity disorder (ADHD), predominantly inattentive type  F90.0       Past Psychiatric History: Hospitalized in early 97s for depression  Past Medical History:  Past Medical History:  Diagnosis Date   Acid reflux    Allergic rhinitis due to pollen    Anxiety    Arthritis    back   Autoimmune hepatitis ([place])    Back pain    Connective tissue disease ([place])    Depression    Dizziness and giddiness    Dyspareunia 05/06/2014   Dysrhythmia    palpatations; on propafenone   Essential (primary) hypertension    Fibromyalgia    Gastroesophageal reflux disease    Chronic abdominal pain; gastroparesis; globus hystericus; irritable bowel syndrome   Glaucoma    Hereditary and idiopathic neuropathy, unspecified    Hyperlipidemia    Irritable bowel syndrome    Major depressive disorder, single episode, unspecified    Morbid (severe) obesity due to excess calories ([place])    Narcotic dependence ([place])  Nonspecific mesenteric lymphadenitis    Overweight(278.02)    Pain in limb    Pain in right foot    PTSD (post-traumatic stress disorder)    Pulmonary nodule    Restless legs syndrome    Sleep apnea    on CPAP machine   Stroke ([place]) 2016   Systemic lupus erythematosus (SLE) in adult [facility])    Tobacco abuse    one pack per day; 35 pack years; quit in February 2023   Type 2 diabetes mellitus with hyperglycemia ([place])     Past Surgical History:  Procedure Laterality Date   ABDOMINAL HYSTERECTOMY     TAH&BSO   BACK SURGERY     BLADDER SUSPENSION   09/12/2011   Procedure: TRANSVAGINAL TAPE (TVT) PROCEDURE;  Surgeon: [name], MD;  Location: [facility];  Service: Urology;  Laterality: N/A;   CESAREAN SECTION     X3   CHOLECYSTECTOMY     ESOPHAGOGASTRODUODENOSCOPY ENDOSCOPY     "throat stretched" per pt.   INTRAVASCULAR PRESSURE WIRE/FFR STUDY N/A 04/21/2022   Procedure: INTRAVASCULAR PRESSURE WIRE/FFR STUDY;  Surgeon: Leonie Man, MD;  Location: [place] [facility];  Service: Cardiovascular;  Laterality: N/A;   LEFT HEART CATH AND CORONARY ANGIOGRAPHY N/A 04/21/2022   Procedure: LEFT HEART CATH AND CORONARY ANGIOGRAPHY;  Surgeon: Leonie Man, MD;  Location: [facility];  Service: Cardiovascular;  Laterality: N/A;   LIVER BIOPSY     x4   LUMBAR EPIDURAL INJECTION  01/2012   TOTAL ABDOMINAL HYSTERECTOMY W/ BILATERAL SALPINGOOPHORECTOMY     TUBAL LIGATION     Bilateral   UPPER GASTROINTESTINAL ENDOSCOPY  09/13/2010    Family Psychiatric History: see below  Family History:  Family History  Problem Relation Age of Onset   Diabetes Mother    Hypertension Mother    Anxiety disorder Mother    Depression Mother    Drug abuse Mother    Heart disease Father    Diabetes Father    Anxiety disorder Father    Alcohol abuse Father    Depression Father    OCD Father    Thyroid disease Sister    Healthy Brother    Healthy Daughter    Healthy Son    ADD / ADHD Son    Alcohol abuse Paternal Grandfather    Depression Paternal Grandfather    Cancer Paternal Grandfather        lung,skin   Tuberculosis Paternal Grandfather    Seizures Paternal Grandmother    Lupus Paternal Grandmother    ADD / ADHD Son    Anesthesia problems Neg Hx    Malignant hyperthermia Neg Hx    Pseudochol deficiency Neg Hx    Hypotension Neg Hx    Bipolar disorder Neg Hx    Dementia Neg Hx    Paranoid behavior Neg Hx    Schizophrenia Neg Hx    Sexual abuse Neg Hx    Physical abuse Neg Hx     Social History:  Social History    Socioeconomic History   Marital status: Divorced    Spouse name: Not on file   Number of children: 3   Years of education: GED   Highest education level: Not on file  Occupational History    Employer: NOT EMPLOYED  Tobacco Use   Smoking status: Former    Packs/day: 1.50    Years: 25.00    Total pack years: 37.50    Types: Cigarettes    Quit  date: 04/25/2013    Years since quitting: 9.0   Smokeless tobacco: Never  Vaping Use   Vaping Use: Some days  Substance and Sexual Activity   Alcohol use: No   Drug use: No   Sexual activity: Yes    Birth control/protection: Surgical, Abstinence  Other Topics Concern   Not on file  Social History Narrative   Divorced since 34.Lives with boyfriend of 11 years.On disability.   Social Determinants of Health   Financial Resource Strain: Not on file  Food Insecurity: Not on file  Transportation Needs: Not on file  Physical Activity: Not on file  Stress: Not on file  Social Connections: Not on file    Allergies:  Allergies  Allergen Reactions   Doxycycline Shortness Of Breath and Rash    Heart racing   Fentanyl Shortness Of Breath, Itching and Other (See Comments)    Heart racing, SOB   Cefoxitin     Unknown reaction   Ceftin [Cefuroxime Axetil]     Unknown reaction   Iodinated Contrast Media Other (See Comments)    Knots on body   Iohexol Other (See Comments)    Knots on body    Norvasc [Amlodipine Besylate]     Unknown reaction   Trintellix [Vortioxetine]     Tremors, heart race   Wellbutrin [Bupropion Hcl] Nausea And Vomiting   Latex Rash   Tape Rash    Metabolic Disorder Labs: Lab Results  Component Value Date   HGBA1C 11.4 (H) 08/03/2020   MPG 280 08/03/2020   MPG 237.43 08/11/2018   No results found for: "PROLACTIN" Lab Results  Component Value Date   CHOL 153 04/25/2019   TRIG 141 04/25/2019   HDL 37 04/25/2019   CHOLHDL 4.9 03/06/2015   VLDL 16 03/06/2015   LDLCALC 91 04/25/2019   LDLCALC 104  (H) 03/06/2015   Lab Results  Component Value Date   TSH 2.79 08/03/2020   TSH 1.550 01/21/2020    Therapeutic Level Labs: No results found for: "LITHIUM" No results found for: "VALPROATE" No results found for: "CBMZ"  Current Medications: Current Outpatient Medications  Medication Sig Dispense Refill   amphetamine-dextroamphetamine (ADDERALL) 20 MG tablet Take 1 tablet (20 mg total) by mouth 2 (two) times daily. 60 tablet 0   amphetamine-dextroamphetamine (ADDERALL) 20 MG tablet Take 1 tablet (20 mg total) by mouth 2 (two) times daily. 60 tablet 0   ACCU-CHEK GUIDE test strip USE TO CHECK BLOOD SUGAR FOUR TIMES DAILY     Accu-Chek Softclix Lancets lancets CHECK SUGAR ONCE DAILY     amphetamine-dextroamphetamine (ADDERALL) 20 MG tablet Take 1 tablet (20 mg total) by mouth 2 (two) times daily. 60 tablet 0   aspirin EC 81 MG tablet Take 81 mg by mouth daily. Swallow whole.     azaTHIOprine (IMURAN) 50 MG tablet Take 1 tablet (50 mg total) by mouth daily. 90 tablet 1   bimatoprost (LUMIGAN) 0.01 % SOLN Place 1 drop into both eyes at bedtime.     Calcium Citrate-Vitamin D3 (CALCIUM CITRATE +D) 315-6.25 MG-MCG TABS Take 1 tablet by mouth 2 (two) times daily.     cholecalciferol (VITAMIN D3) 25 MCG (1000 UNIT) tablet Take 2,000 Units by mouth daily.     cycloSPORINE (RESTASIS) 0.05 % ophthalmic emulsion Place 1 drop into both eyes 2 (two) times daily.     dicyclomine (BENTYL) 10 MG capsule Take 1 capsule (10 mg total) by mouth 2 (two) times daily as needed. 60 capsule 2  Dulaglutide (TRULICITY) 3.61 WE/3.1VQ SOPN Inject 0.75 mg into the skin once a week. 2 mL 3   DULoxetine (CYMBALTA) 60 MG capsule TAKE 1 CAPSULE(60 MG) BY MOUTH TWICE DAILY 60 capsule 2   esomeprazole (NEXIUM) 40 MG capsule TAKE 1 CAPSULE(40 MG) BY MOUTH EVERY MORNING 90 capsule 1   FARXIGA 10 MG TABS tablet Take 10 mg by mouth daily.     fexofenadine (ALLEGRA) 180 MG tablet Take 180 mg by mouth daily.     gabapentin  (NEURONTIN) 100 MG capsule Take 100 mg by mouth 3 (three) times daily.     HYDROcodone-acetaminophen (NORCO/VICODIN) 5-325 MG tablet Take 1 tablet by mouth 2 (two) times daily as needed for moderate pain. 0.5 -1 po bid prn     hydroxychloroquine (PLAQUENIL) 200 MG tablet Take 1 tablet (200 mg total) by mouth 2 (two) times daily. 60 tablet 2   lisinopril (ZESTRIL) 40 MG tablet TAKE 1 TABLET(40 MG) BY MOUTH DAILY 90 tablet 0   metFORMIN (GLUCOPHAGE) 1000 MG tablet Take 1 tablet (1,000 mg total) by mouth 2 (two) times daily with a meal. 60 tablet 3   methocarbamol (ROBAXIN) 500 MG tablet Take 500 mg by mouth 2 (two) times daily as needed for muscle spasms.     metoprolol succinate (TOPROL-XL) 100 MG 24 hr tablet TAKE 1 TABLET(100 MG) BY MOUTH DAILY WITH FOOD (Patient not taking: Reported on 04/18/2022) 90 tablet 3   mirabegron ER (MYRBETRIQ) 50 MG TB24 tablet Take 1 tablet (50 mg total) by mouth daily. (Patient not taking: Reported on 04/18/2022) 30 tablet 11   Multiple Vitamins-Minerals (CENTRUM) tablet Take 1 tablet by mouth daily.     nitroGLYCERIN (NITROLINGUAL) 0.4 MG/SPRAY spray Place 1 spray under the tongue every 5 (five) minutes x 3 doses as needed for chest pain.     NP THYROID 60 MG tablet TAKE 1 TABLET(60 MG) BY MOUTH DAILY BEFORE BREAKFAST 30 tablet 3   Polyvinyl Alcohol-Povidone (REFRESH OP) Place 1 drop into both eyes daily as needed (dry eyes).     prazosin (MINIPRESS) 2 MG capsule TAKE 1 CAPSULE(2 MG) BY MOUTH AT BEDTIME 30 capsule 2   PRESCRIPTION MEDICATION Take 10 mg by mouth 3 (three) times daily before meals. This is MOTILLIUM ( DOMPERIDONE 10 mg) Tablet     PREVIDENT 5000 PLUS 1.1 % CREA dental cream Place 1 Application onto teeth every morning.     Probiotic Product (PROBIOTIC PO) Take 1 capsule by mouth daily.     propafenone (RYTHMOL SR) 225 MG 12 hr capsule Take 225 mg by mouth every 12 (twelve) hours.     rosuvastatin (CRESTOR) 40 MG tablet Take 40 mg by mouth daily.      spironolactone (ALDACTONE) 25 MG tablet Take 25 mg by mouth daily.     No current facility-administered medications for this visit.     Musculoskeletal: Strength & Muscle Tone: na Gait & Station: na Patient leans: N/A  Psychiatric Specialty Exam: Review of Systems  Constitutional:  Positive for fatigue.  Cardiovascular:  Positive for chest pain.  All other systems reviewed and are negative.   There were no vitals taken for this visit.There is no height or weight on file to calculate BMI.  General Appearance: NA  Eye Contact:  NA  Speech:  Clear and Coherent  Volume:  Normal  Mood:  Euthymic  Affect:  NA  Thought Process:  Goal Directed  Orientation:  Full (Time, Place, and Person)  Thought Content: WDL  Suicidal Thoughts:  No  Homicidal Thoughts:  No  Memory:  Immediate;   Good Recent;   Good Remote;   Fair  Judgement:  Good  Insight:  Good  Psychomotor Activity:  Decreased  Concentration:  Concentration: Good and Attention Span: Good  Recall:  Good  Fund of Knowledge: Good  Language: Good  Akathisia:  No  Handed:  Right  AIMS (if indicated): not done  Assets:  Communication Skills Desire for Improvement Resilience Social Support Talents/Skills  ADL's:  Intact  Cognition: WNL  Sleep:  Good   Screenings: PHQ2-9    Flowsheet Row Video Visit from 02/08/2022 in [facility] Video Visit from 11/09/2021 in [facility] Video Visit from 08/12/2021 in [facility] Video Visit from 04/15/2021 in [facility] from [facility]/04/2021 in [facility]  PHQ-2 Total Score 2 0 '1 1 6  '$ PHQ-9 Total Score 7 -- -- 2 17      Flowsheet Row Admission (Discharged) from 04/21/2022 in [facility] Video Visit from 02/08/2022 in [facility] Video Visit from 11/09/2021 in [facility] No Risk No Risk No Risk        Assessment and Plan: This patient is a 51 year old female with a history of PTSD nightmares depression anxiety and ADHD.  She is also dealing with lupus and coronary artery disease.  For now she thinks she is doing okay with the Cymbalta 60 mg twice daily for depression, prazosin 2 mg at bedtime for nightmares and sleep and Adderall 20 mg twice daily for ADHD.  She will return to see me in 3 months  Collaboration of Care: Collaboration of Care: Primary Care Provider AEB notes will be shared with PCP at patient's request  Patient/Guardian was advised Release of Information must be obtained prior to any record release in order to collaborate their care with an outside provider. Patient/Guardian was advised if they have not already done so to contact the registration department to sign all necessary forms in order for [place] to release information regarding their care.   Consent: Patient/Guardian gives verbal consent for treatment and assignment of benefits for services provided during this visit. Patient/Guardian expressed understanding and agreed to proceed.    Lindsay Spiller, MD 05/17/2022, 3:19 PM

## 2022-05-22 ENCOUNTER — Ambulatory Visit (INDEPENDENT_AMBULATORY_CARE_PROVIDER_SITE_OTHER): Payer: Medicare Other | Admitting: Gastroenterology

## 2022-05-22 ENCOUNTER — Encounter (INDEPENDENT_AMBULATORY_CARE_PROVIDER_SITE_OTHER): Payer: Self-pay | Admitting: Gastroenterology

## 2022-05-22 VITALS — BP 122/87 | HR 123 | Temp 98.1°F | Ht 60.0 in | Wt 192.1 lb

## 2022-05-22 DIAGNOSIS — K582 Mixed irritable bowel syndrome: Secondary | ICD-10-CM

## 2022-05-22 DIAGNOSIS — E1143 Type 2 diabetes mellitus with diabetic autonomic (poly)neuropathy: Secondary | ICD-10-CM

## 2022-05-22 DIAGNOSIS — K754 Autoimmune hepatitis: Secondary | ICD-10-CM

## 2022-05-22 DIAGNOSIS — K3184 Gastroparesis: Secondary | ICD-10-CM

## 2022-05-22 NOTE — Progress Notes (Signed)
Lindsay Walsh, M.D. Gastroenterology & Hepatology [facility] Gastroenterology [address] [place], [place] [address]  Primary Care Physician: Eliezer Lofts, MD [address] [place] [address]  I will communicate my assessment and recommendations to the referring MD via EMR.  Problems: autoimmune hepatitis with stage 2-3 fibrosis. Diabetic gastroparesis  History of Present Illness: Lindsay Walsh is a 51 y.o. female with Pmh lupus, autoimmune hepatitis with stage II -III fibrosis, diabetic gastroparesis, GERD, depression, fibromyalgia, chronic opiate use, diabetes, hyperlipidemia, IBS, anxiety and obesity, who presents for follow up of autoimmune hepatitis and diabetic gastroparesis.  The patient was last seen on 11/15/2021. At that time, the patient was advised to continue domperidone 10 mg before every meal and at bedtime if needed.  She was advised to continue on azathioprine 50 mg daily and Bentyl as needed for IBS.  The patient is currently followed at [facility] hepatology as well.  The last time she was seen in clinic was on 02/27/2022.  She was advised to continue on azathioprine 50 mg every day.  She was also advised to receive hepatitis A vaccination.  She is supposed to follow-up in February 2024.  Patient underwent FibroScan at [facility].  This was performed on 04/08/2022.  She had median kPa of 7.4, with presence of S3 steatosis and F2 fibrosis.  Most recent blood work-up from 02/27/2022 showed AST of 77, ALT 21, alkaline phosphatase 104, total bilirubin 0.5, CBC WBC 10.3, platelets 329 and hemoglobin 13.6.  Patient reports that she has chronic episodes of nausea and "spells of vomiting" possibly once a month, although at some point it was happening possibly 1-2 times a week. Is wondering if there is any other medication available besides domperidone as [place]. Does not remember why  she was not a candidate for Reglan in the past. She has presented some bloating in the past. She has 2 meals per day on average. She reports that while taking domperidone TID, her symptoms are milder but sometimes can take it 4 times a day.  She has been taking hydrocodone possibly 2 times a day for management of chronic pain.  She reports having a bowel movement every other day. Does not take any laxatives frequently, only takes Ex-lax as needed but had to strain to move her bowels.  The patient denies having any nausea, vomiting, fever, chills, hematochezia, melena, hematemesis, abdominal pain, diarrhea, jaundice, pruritus. Has lost some weight as she has been more active recently.  Last EGD: 04/25/2018 -  Last Colonoscopy: 04/25/2018 - had one tubular adenomas  Past Medical History: Past Medical History:  Diagnosis Date   Acid reflux    Allergic rhinitis due to pollen    Anxiety    Arthritis    back   Autoimmune hepatitis (HCC)    Back pain    Connective tissue disease (HCC)    Depression    Dizziness and giddiness    Dyspareunia 05/06/2014   Dysrhythmia    palpatations; on propafenone   Essential (primary) hypertension    Fibromyalgia    Gastroesophageal reflux disease    Chronic abdominal pain; gastroparesis; globus hystericus; irritable bowel syndrome   Glaucoma    Hereditary and idiopathic neuropathy, unspecified    Hyperlipidemia    Irritable bowel syndrome    Major depressive disorder, single episode, unspecified    Morbid (severe) obesity due to excess calories (HCC)    Narcotic dependence (HCC)  Nonspecific mesenteric lymphadenitis    Overweight(278.02)    Pain in limb    Pain in right foot    PTSD (post-traumatic stress disorder)    Pulmonary nodule    Restless legs syndrome    Sleep apnea    on CPAP machine   Stroke (HCC) 2016   Systemic lupus erythematosus (SLE) in adult [facility])    Tobacco abuse    one pack per day; 35 pack years; quit in February 2023    Type 2 diabetes mellitus with hyperglycemia (HCC)     Past Surgical History: Past Surgical History:  Procedure Laterality Date   ABDOMINAL HYSTERECTOMY     TAH&BSO   BACK SURGERY     BLADDER SUSPENSION  09/12/2011   Procedure: TRANSVAGINAL TAPE (TVT) PROCEDURE;  Surgeon: Ky Barban, MD;  Location: [facility];  Service: Urology;  Laterality: N/A;   CESAREAN SECTION     X3   CHOLECYSTECTOMY     ESOPHAGOGASTRODUODENOSCOPY ENDOSCOPY     "throat stretched" per pt.   INTRAVASCULAR PRESSURE WIRE/FFR STUDY N/A 04/21/2022   Procedure: INTRAVASCULAR PRESSURE WIRE/FFR STUDY;  Surgeon: Marykay Lex, MD;  Location: [facility] INVASIVE CV LAB;  Service: Cardiovascular;  Laterality: N/A;   LEFT HEART CATH AND CORONARY ANGIOGRAPHY N/A 04/21/2022   Procedure: LEFT HEART CATH AND CORONARY ANGIOGRAPHY;  Surgeon: Marykay Lex, MD;  Location: [facility] INVASIVE CV LAB;  Service: Cardiovascular;  Laterality: N/A;   LIVER BIOPSY     x4   LUMBAR EPIDURAL INJECTION  01/2012   TOTAL ABDOMINAL HYSTERECTOMY W/ BILATERAL SALPINGOOPHORECTOMY     TUBAL LIGATION     Bilateral   UPPER GASTROINTESTINAL ENDOSCOPY  09/13/2010    Family History: Family History  Problem Relation Age of Onset   Diabetes Mother    Hypertension Mother    Anxiety disorder Mother    Depression Mother    Drug abuse Mother    Heart disease Father    Diabetes Father    Anxiety disorder Father    Alcohol abuse Father    Depression Father    OCD Father    Thyroid disease Sister    Healthy Brother    Healthy Daughter    Healthy Son    ADD / ADHD Son    Alcohol abuse Paternal Grandfather    Depression Paternal Grandfather    Cancer Paternal Grandfather        lung,skin   Tuberculosis Paternal Grandfather    Seizures Paternal Grandmother    Lupus Paternal Grandmother    ADD / ADHD Son    Anesthesia problems Neg Hx    Malignant hyperthermia Neg Hx    Pseudochol deficiency Neg Hx    Hypotension Neg Hx    Bipolar disorder Neg Hx     Dementia Neg Hx    Paranoid behavior Neg Hx    Schizophrenia Neg Hx    Sexual abuse Neg Hx    Physical abuse Neg Hx     Social History: Social History   Tobacco Use  Smoking Status Former   Packs/day: 1.50   Years: 25.00   Total pack years: 37.50   Types: Cigarettes   Quit date: 04/25/2013   Years since quitting: 9.0  Smokeless Tobacco Never   Social History   Substance and Sexual Activity  Alcohol Use No   Social History   Substance and Sexual Activity  Drug Use No    Allergies: Allergies  Allergen Reactions   Doxycycline Shortness Of Breath and Rash  Heart racing   Fentanyl Shortness Of Breath, Itching and Other (See Comments)    Heart racing, SOB   Cefoxitin     Unknown reaction   Ceftin [Cefuroxime Axetil]     Unknown reaction   Iodinated Contrast Media Other (See Comments)    Knots on body   Iohexol Other (See Comments)    Knots on body    Norvasc [Amlodipine Besylate]     Unknown reaction   Trintellix [Vortioxetine]     Tremors, heart race   Wellbutrin [Bupropion Hcl] Nausea And Vomiting   Latex Rash   Tape Rash    Medications: Current Outpatient Medications  Medication Sig Dispense Refill   ACCU-CHEK GUIDE test strip USE TO CHECK BLOOD SUGAR FOUR TIMES DAILY     Accu-Chek Softclix Lancets lancets CHECK SUGAR ONCE DAILY     amphetamine-dextroamphetamine (ADDERALL) 20 MG tablet Take 1 tablet (20 mg total) by mouth 2 (two) times daily. 60 tablet 0   amphetamine-dextroamphetamine (ADDERALL) 20 MG tablet Take 1 tablet (20 mg total) by mouth 2 (two) times daily. 60 tablet 0   amphetamine-dextroamphetamine (ADDERALL) 20 MG tablet Take 1 tablet (20 mg total) by mouth 2 (two) times daily. 60 tablet 0   aspirin EC 81 MG tablet Take 81 mg by mouth daily. Swallow whole.     azaTHIOprine (IMURAN) 50 MG tablet Take 1 tablet (50 mg total) by mouth daily. 90 tablet 1   bimatoprost (LUMIGAN) 0.01 % SOLN Place 1 drop into both eyes at bedtime.     Calcium  Citrate-Vitamin D3 (CALCIUM CITRATE +D) 315-6.25 MG-MCG TABS Take 1 tablet by mouth 2 (two) times daily.     cholecalciferol (VITAMIN D3) 25 MCG (1000 UNIT) tablet Take 2,000 Units by mouth daily.     cycloSPORINE (RESTASIS) 0.05 % ophthalmic emulsion Place 1 drop into both eyes 2 (two) times daily.     dicyclomine (BENTYL) 10 MG capsule Take 1 capsule (10 mg total) by mouth 2 (two) times daily as needed. 60 capsule 2   Dulaglutide (TRULICITY) 0.75 MG/0.5ML SOPN Inject 0.75 mg into the skin once a week. 2 mL 3   DULoxetine (CYMBALTA) 60 MG capsule TAKE 1 CAPSULE(60 MG) BY MOUTH TWICE DAILY 60 capsule 2   esomeprazole (NEXIUM) 40 MG capsule TAKE 1 CAPSULE(40 MG) BY MOUTH EVERY MORNING 90 capsule 1   FARXIGA 10 MG TABS tablet Take 10 mg by mouth daily.     fexofenadine (ALLEGRA) 180 MG tablet Take 180 mg by mouth daily.     gabapentin (NEURONTIN) 100 MG capsule Take 100 mg by mouth 3 (three) times daily.     HYDROcodone-acetaminophen (NORCO/VICODIN) 5-325 MG tablet Take 1 tablet by mouth 2 (two) times daily as needed for moderate pain. 0.5 -1 po bid prn     hydroxychloroquine (PLAQUENIL) 200 MG tablet Take 1 tablet (200 mg total) by mouth 2 (two) times daily. 60 tablet 2   lisinopril (ZESTRIL) 40 MG tablet TAKE 1 TABLET(40 MG) BY MOUTH DAILY 90 tablet 0   metFORMIN (GLUCOPHAGE) 1000 MG tablet Take 1 tablet (1,000 mg total) by mouth 2 (two) times daily with a meal. 60 tablet 3   methocarbamol (ROBAXIN) 500 MG tablet Take 500 mg by mouth 2 (two) times daily as needed for muscle spasms.     Multiple Vitamins-Minerals (CENTRUM) tablet Take 1 tablet by mouth daily.     nitroGLYCERIN (NITROLINGUAL) 0.4 MG/SPRAY spray Place 1 spray under the tongue every 5 (five) minutes x 3 doses  as needed for chest pain.     NP THYROID 60 MG tablet TAKE 1 TABLET(60 MG) BY MOUTH DAILY BEFORE BREAKFAST 30 tablet 3   Polyvinyl Alcohol-Povidone (REFRESH OP) Place 1 drop into both eyes daily as needed (dry eyes).     prazosin  (MINIPRESS) 2 MG capsule TAKE 1 CAPSULE(2 MG) BY MOUTH AT BEDTIME 30 capsule 2   PRESCRIPTION MEDICATION Take 10 mg by mouth 3 (three) times daily before meals. This is MOTILLIUM ( DOMPERIDONE 10 mg) Tablet     PREVIDENT 5000 PLUS 1.1 % CREA dental cream Place 1 Application onto teeth every morning.     Probiotic Product (PROBIOTIC PO) Take 1 capsule by mouth daily.     propafenone (RYTHMOL SR) 225 MG 12 hr capsule Take 225 mg by mouth every 12 (twelve) hours.     REPATHA 140 MG/ML SOSY Inject 1 mL into the skin every 14 (fourteen) days.     rosuvastatin (CRESTOR) 40 MG tablet Take 40 mg by mouth daily.     spironolactone (ALDACTONE) 25 MG tablet Take 25 mg by mouth daily.     mirabegron ER (MYRBETRIQ) 50 MG TB24 tablet Take 1 tablet (50 mg total) by mouth daily. (Patient not taking: Reported on 04/18/2022) 30 tablet 11   No current facility-administered medications for this visit.    Review of Systems: GENERAL: negative for malaise, night sweats HEENT: No changes in hearing or vision, no nose bleeds or other nasal problems. NECK: Negative for lumps, goiter, pain and significant neck swelling RESPIRATORY: Negative for cough, wheezing CARDIOVASCULAR: Negative for chest pain, leg swelling, palpitations, orthopnea GI: SEE HPI MUSCULOSKELETAL: Negative for joint pain or swelling, back pain, and muscle pain. SKIN: Negative for lesions, rash PSYCH: Negative for sleep disturbance, mood disorder and recent psychosocial stressors. HEMATOLOGY Negative for prolonged bleeding, bruising easily, and swollen nodes. ENDOCRINE: Negative for cold or heat intolerance, polyuria, polydipsia and goiter. NEURO: negative for tremor, gait imbalance, syncope and seizures. The remainder of the review of systems is noncontributory.   Physical Exam: BP 122/87 (BP Location: Left Arm, Patient Position: Sitting, Cuff Size: Large)   Pulse (!) 123   Temp 98.1 F (36.7 C) (Oral)   Ht 5' (1.524 m)   Wt 192 lb 1.6 oz  (87.1 kg)   BMI 37.52 kg/m  GENERAL: The patient is AO x3, in no acute distress. HEENT: Head is normocephalic and atraumatic. EOMI are intact. Mouth is well hydrated and without lesions. Obese. NECK: Supple. No masses LUNGS: Clear to auscultation. No presence of rhonchi/wheezing/rales. Adequate chest expansion HEART: RRR, normal s1 and s2. ABDOMEN: mildly tender upon palpation of the upper abdomen, no guarding, no peritoneal signs, and nondistended. BS +. No masses. EXTREMITIES: Without any cyanosis, clubbing, rash, lesions or edema. NEUROLOGIC: AOx3, no focal motor deficit. SKIN: no jaundice, no rashes  Imaging/Labs: as above  I personally reviewed and interpreted the available labs, imaging and endoscopic files.  Impression and Plan: Lindsay Walsh is a 51 y.o. female with Pmh lupus, autoimmune hepatitis with stage II -III fibrosis, diabetic gastroparesis, GERD, depression, fibromyalgia, chronic opiate use, diabetes, hyperlipidemia, IBS, anxiety and obesity, who presents for follow up of autoimmune hepatitis and diabetic gastroparesis.  The patient has presented relatively adequate control of her gastroparesis symptoms on domperidone but she reportedly has been difficult for her to get this medication recently.  She is interested in switching to oral agent.  We discussed that the options are limited for gastroparesis but we could try  it again Reglan (I do not see a straight contraindication for this), I advised her to stop the domperidone when she starts the low-dose Reglan.  If she were to have recurrent symptoms that do not improve with increasing doses of Reglan, we will have to go back to domperidone.  She will definitely benefit from continued weight loss, needs to implement dietary and lifestyle modifications.  Regarding her autoimmune hepatitis, she has tolerated low-dose azathioprine.  She had evidence of some fibrosis on most recent FibroScan.  She will need to have a FibroScan  every 2 years at least.  We will repeat a CMP and IgG today and she was advised to continue on azathioprine 50 mg daily.  Finally, she presented some constipation which may improve with the use of MiraLAX.  -Check CMP and IgG - Continue azathioprine 50 mg qday - Continue exercising to achieve further weight loss, stop drinking sodas or juices! - Will switch to Reglan 5 mg TID - stop domperidone when you start this medicine. If no improvement may consider increasing to 10 mg qday or restarting domperidone  - Eat small portions multiple times per day (5-6 times) - Start taking Miralax 1 capful every day for one week. If bowel movements do not improve, increase to 2 capfuls every day. If after two weeks there is no improvement, increase to 2 capfuls in AM and one at night. - RTC 6 months  All questions were answered.      Lindsay Blazing, MD Gastroenterology and Hepatology [facility] Gastroenterology

## 2022-05-22 NOTE — Patient Instructions (Addendum)
Perform blood workup Continue azathioprine 50 mg qday Continue exercising to achieve further weight loss, stop drinking sodas or juices! Will switch to Reglan 5 mg TID - stop domperidone when you start this medicine. If no improvement may consider increasing to 10 mg qday or restarting domperidone  Eat small portions multiple times per day (5-6 times) Start taking Miralax 1 capful every day for one week. If bowel movements do not improve, increase to 2 capfuls every day. If after two weeks there is no improvement, increase to 2 capfuls in AM and one at night.'

## 2022-05-23 ENCOUNTER — Other Ambulatory Visit: Payer: Self-pay

## 2022-05-23 ENCOUNTER — Telehealth (INDEPENDENT_AMBULATORY_CARE_PROVIDER_SITE_OTHER): Payer: Self-pay

## 2022-05-23 ENCOUNTER — Encounter (HOSPITAL_COMMUNITY): Payer: Self-pay | Admitting: Emergency Medicine

## 2022-05-23 ENCOUNTER — Emergency Department (HOSPITAL_COMMUNITY)
Admission: EM | Admit: 2022-05-23 | Discharge: 2022-05-23 | Disposition: A | Discharge status: Home / Self Care | Payer: Medicare Other | Attending: Emergency Medicine | Admitting: Emergency Medicine

## 2022-05-23 ENCOUNTER — Other Ambulatory Visit (HOSPITAL_COMMUNITY)
Admission: RE | Admit: 2022-05-23 | Discharge: 2022-05-23 | Disposition: A | Discharge status: Home / Self Care | Payer: Medicare Other | Source: Ambulatory Visit | Attending: Gastroenterology | Admitting: Gastroenterology

## 2022-05-23 ENCOUNTER — Other Ambulatory Visit (INDEPENDENT_AMBULATORY_CARE_PROVIDER_SITE_OTHER): Payer: Self-pay

## 2022-05-23 DIAGNOSIS — E875 Hyperkalemia: Secondary | ICD-10-CM | POA: Insufficient documentation

## 2022-05-23 DIAGNOSIS — R000 Tachycardia, unspecified: Secondary | ICD-10-CM | POA: Insufficient documentation

## 2022-05-23 DIAGNOSIS — R799 Abnormal finding of blood chemistry, unspecified: Secondary | ICD-10-CM | POA: Diagnosis not present

## 2022-05-23 DIAGNOSIS — Z79899 Other long term (current) drug therapy: Secondary | ICD-10-CM | POA: Diagnosis not present

## 2022-05-23 DIAGNOSIS — Z7984 Long term (current) use of oral hypoglycemic drugs: Secondary | ICD-10-CM | POA: Insufficient documentation

## 2022-05-23 DIAGNOSIS — Z91040 Latex allergy status: Secondary | ICD-10-CM | POA: Diagnosis not present

## 2022-05-23 DIAGNOSIS — Z87891 Personal history of nicotine dependence: Secondary | ICD-10-CM | POA: Diagnosis not present

## 2022-05-23 DIAGNOSIS — I10 Essential (primary) hypertension: Secondary | ICD-10-CM | POA: Insufficient documentation

## 2022-05-23 DIAGNOSIS — N390 Urinary tract infection, site not specified: Secondary | ICD-10-CM | POA: Insufficient documentation

## 2022-05-23 DIAGNOSIS — E1165 Type 2 diabetes mellitus with hyperglycemia: Secondary | ICD-10-CM | POA: Insufficient documentation

## 2022-05-23 DIAGNOSIS — R899 Unspecified abnormal finding in specimens from other organs, systems and tissues: Secondary | ICD-10-CM

## 2022-05-23 DIAGNOSIS — Z7982 Long term (current) use of aspirin: Secondary | ICD-10-CM | POA: Insufficient documentation

## 2022-05-23 DIAGNOSIS — R946 Abnormal results of thyroid function studies: Secondary | ICD-10-CM | POA: Insufficient documentation

## 2022-05-23 DIAGNOSIS — Z8673 Personal history of transient ischemic attack (TIA), and cerebral infarction without residual deficits: Secondary | ICD-10-CM | POA: Insufficient documentation

## 2022-05-23 LAB — CBC
HCT: 37.5 % (ref 36.0–46.0)
Hemoglobin: 12.4 g/dL (ref 12.0–15.0)
MCH: 31.1 pg (ref 26.0–34.0)
MCHC: 33.1 g/dL (ref 30.0–36.0)
MCV: 94 fL (ref 80.0–100.0)
Platelets: 268 10*3/uL (ref 150–400)
RBC: 3.99 MIL/uL (ref 3.87–5.11)
RDW: 12.5 % (ref 11.5–15.5)
WBC: 7.9 10*3/uL (ref 4.0–10.5)
nRBC: 0 % (ref 0.0–0.2)

## 2022-05-23 LAB — COMPREHENSIVE METABOLIC PANEL
ALT: 26 IU/L (ref 0–32)
ALT: 22 U/L (ref 0–44)
AST: 24 IU/L (ref 0–40)
AST: 25 U/L (ref 15–41)
Albumin/Globulin Ratio: 1.7 (ref 1.2–2.2)
Albumin: 4.4 g/dL (ref 3.8–4.9)
Albumin: 3.9 g/dL (ref 3.5–5.0)
Alkaline Phosphatase: 92 IU/L (ref 44–121)
Alkaline Phosphatase: 70 U/L (ref 38–126)
Anion gap: 7 (ref 5–15)
BUN/Creatinine Ratio: 12 (ref 9–23)
BUN: 11 mg/dL (ref 6–24)
BUN: 16 mg/dL (ref 6–20)
Bilirubin Total: 0.3 mg/dL (ref 0.0–1.2)
CO2: 25 mmol/L (ref 20–29)
CO2: 24 mmol/L (ref 22–32)
Calcium: 9.5 mg/dL (ref 8.7–10.2)
Calcium: 9 mg/dL (ref 8.9–10.3)
Chloride: 101 mmol/L (ref 96–106)
Chloride: 107 mmol/L (ref 98–111)
Creatinine, Ser: 0.9 mg/dL (ref 0.57–1.00)
Creatinine, Ser: 0.86 mg/dL (ref 0.44–1.00)
GFR, Estimated: >60 mL/min (ref >60)
Globulin, Total: 2.6 g/dL (ref 1.5–4.5)
Glucose, Bld: 108 mg/dL — ABNORMAL HIGH (ref 70–99)
Glucose: 105 mg/dL — ABNORMAL HIGH (ref 70–99)
Potassium: 6.1 mmol/L — ABNORMAL HIGH (ref 3.5–5.2)
Potassium: 5 mmol/L (ref 3.5–5.1)
Sodium: 138 mmol/L (ref 134–144)
Sodium: 138 mmol/L (ref 135–145)
Total Bilirubin: 0.4 mg/dL (ref 0.3–1.2)
Total Protein: 7 g/dL (ref 6.0–8.5)
Total Protein: 7.2 g/dL (ref 6.5–8.1)
eGFR: 77 mL/min/{1.73_m2} (ref >59)

## 2022-05-23 LAB — BASIC METABOLIC PANEL
Anion gap: 6 (ref 5–15)
BUN: 16 mg/dL (ref 6–20)
CO2: 25 mmol/L (ref 22–32)
Calcium: 9.2 mg/dL (ref 8.9–10.3)
Chloride: 107 mmol/L (ref 98–111)
Creatinine, Ser: 0.88 mg/dL (ref 0.44–1.00)
GFR, Estimated: >60 mL/min (ref >60)
Glucose, Bld: 120 mg/dL — ABNORMAL HIGH (ref 70–99)
Potassium: 5.8 mmol/L — ABNORMAL HIGH (ref 3.5–5.1)
Sodium: 138 mmol/L (ref 135–145)

## 2022-05-23 LAB — URINALYSIS, ROUTINE W REFLEX MICROSCOPIC
Bilirubin Urine: NEGATIVE
Glucose, UA: >=500 mg/dL — AB
Ketones, ur: NEGATIVE mg/dL
Nitrite: NEGATIVE
Protein, ur: NEGATIVE mg/dL
Specific Gravity, Urine: 1.009 (ref 1.005–1.030)
WBC, UA: >50 WBC/hpf — ABNORMAL HIGH (ref 0–5)
pH: 7 (ref 5.0–8.0)

## 2022-05-23 LAB — TSH: TSH: 4.616 u[IU]/mL — ABNORMAL HIGH (ref 0.350–4.500)

## 2022-05-23 LAB — IGG: IgG (Immunoglobin G), Serum: 976 mg/dL (ref 586–1602)

## 2022-05-23 LAB — MAGNESIUM: Magnesium: 2 mg/dL (ref 1.7–2.4)

## 2022-05-23 MED ORDER — METOCLOPRAMIDE HCL 5 MG PO TABS: 5.0000 mg | ORAL_TABLET | Freq: Three times a day (TID) | ORAL | 1 refills | Status: DC

## 2022-05-23 MED ORDER — CEPHALEXIN 500 MG PO CAPS: 500.0000 mg | ORAL_CAPSULE | Freq: Three times a day (TID) | ORAL | 0 refills | Status: AC

## 2022-05-23 NOTE — Telephone Encounter (Signed)
Noted  

## 2022-05-23 NOTE — Discharge Instructions (Addendum)
Please follow-up with your cardiologist regarding Aldactone/spironolactone dosing.  Please follow-up with PCP in the next 3 days for repeat metabolic panel/potassium level  It was a pleasure caring for you today in the emergency department.  Please return to the emergency department for any worsening or worrisome symptoms.

## 2022-05-23 NOTE — Telephone Encounter (Signed)
Patient was made aware of lab results, she was made aware this could be a lab error, but we would need to make sure. She is aware to go straight away to [facility] where we have sent the order stat. I have called and confirmed with the lab that they could see the Stat order, and I have faxed the order over to them at [phone] with instruction to call the stat lab to Dr. Jenetta Downer.

## 2022-05-23 NOTE — Telephone Encounter (Signed)
Thanks, repeat was 5.8, advised to stop aldactone and to go to the ER.

## 2022-05-23 NOTE — ED Provider Notes (Signed)
[facility] EMERGENCY DEPARTMENT Provider Note   CSN: [id number] Arrival date & time: 05/23/22  1617     History  Chief Complaint  Patient presents with   Abnormal Lab    Lindsay Walsh is a 51 y.o. female.  Patient as above with significant medical history as below, including depression, hepatitis, hypertension, fibromyalgia, GERD, HLD, IBS who presents to the ED with complaint of abnormal potassium.  Patient was seen by gastroenterology, sent to the ED secondary to elevated potassium level.  Potassium most recent was 5.8.  She is on spironolactone.  Patient reports she is at her typical state of health at this time.  No changes to her chronic Conditions.  No nausea or vomiting.  No change to bowel or bladder function from her baseline.  No change to her typical p.o. intake.  No recent medication or diet changes.  No change to urination.  No fevers or chills.  No recent travel or sick contacts.  Reports in the past she has had issues with low potassium but not issues with high potassium previously.  Note she had LHC 10/13 moderate CAD Dr Ellyn Hack      Past Medical History:  Diagnosis Date   Acid reflux    Allergic rhinitis due to pollen    Anxiety    Arthritis    back   Autoimmune hepatitis ([place])    Back pain    Connective tissue disease ([place])    Depression    Dizziness and giddiness    Dyspareunia 05/06/2014   Dysrhythmia    palpatations; on propafenone   Essential (primary) hypertension    Fibromyalgia    Gastroesophageal reflux disease    Chronic abdominal pain; gastroparesis; globus hystericus; irritable bowel syndrome   Glaucoma    Hereditary and idiopathic neuropathy, unspecified    Hyperlipidemia    Irritable bowel syndrome    Major depressive disorder, single episode, unspecified    Morbid (severe) obesity due to excess calories (HCC)    Narcotic dependence ([place])    Nonspecific mesenteric lymphadenitis    Overweight(278.02)    Pain in limb    Pain in  right foot    PTSD (post-traumatic stress disorder)    Pulmonary nodule    Restless legs syndrome    Sleep apnea    on CPAP machine   Stroke ([place]) 2016   Systemic lupus erythematosus (SLE) in adult [facility])    Tobacco abuse    one pack per day; 35 pack years; quit in February 2023   Type 2 diabetes mellitus with hyperglycemia ([place])     Past Surgical History:  Procedure Laterality Date   ABDOMINAL HYSTERECTOMY     TAH&BSO   BACK SURGERY     BLADDER SUSPENSION  09/12/2011   Procedure: TRANSVAGINAL TAPE (TVT) PROCEDURE;  Surgeon: [name], MD;  Location: [facility];  Service: Urology;  Laterality: N/A;   CESAREAN SECTION     X3   CHOLECYSTECTOMY     ESOPHAGOGASTRODUODENOSCOPY ENDOSCOPY     "throat stretched" per pt.   INTRAVASCULAR PRESSURE WIRE/FFR STUDY N/A 04/21/2022   Procedure: INTRAVASCULAR PRESSURE WIRE/FFR STUDY;  Surgeon: Leonie Man, MD;  Location: [place] [facility];  Service: Cardiovascular;  Laterality: N/A;   LEFT HEART CATH AND CORONARY ANGIOGRAPHY N/A 04/21/2022   Procedure: LEFT HEART CATH AND CORONARY ANGIOGRAPHY;  Surgeon: Leonie Man, MD;  Location: [place] [facility];  Service: Cardiovascular;  Laterality: N/A;   LIVER BIOPSY  x4   LUMBAR EPIDURAL INJECTION  01/2012   TOTAL ABDOMINAL HYSTERECTOMY W/ BILATERAL SALPINGOOPHORECTOMY     TUBAL LIGATION     Bilateral   UPPER GASTROINTESTINAL ENDOSCOPY  09/13/2010     The history is provided by the patient and the spouse. No language interpreter was used.  Abnormal Lab      Home Medications Prior to Admission medications   Medication Sig Start Date End Date Taking? Authorizing Provider  amphetamine-dextroamphetamine (ADDERALL) 20 MG tablet Take 1 tablet (20 mg total) by mouth 2 (two) times daily. 05/17/22  Yes Cloria Spring, MD  aspirin EC 81 MG tablet Take 81 mg by mouth daily. Swallow whole.   Yes [provider]  atenolol (TENORMIN) 100 MG tablet Take 100 mg by mouth daily.  05/05/22  Yes [provider]  azaTHIOprine (IMURAN) 50 MG tablet Take 1 tablet (50 mg total) by mouth daily. 11/15/21  Yes [name], Mechele Dawley, MD  bimatoprost (LUMIGAN) 0.01 % SOLN Place 1 drop into both eyes at bedtime.   Yes [provider]  Calcium Citrate-Vitamin D3 (CALCIUM CITRATE +D) 315-6.25 MG-MCG TABS Take 1 tablet by mouth 2 (two) times daily.   Yes [provider]  cephALEXin (KEFLEX) 500 MG capsule Take 1 capsule (500 mg total) by mouth 3 (three) times daily for 7 days. 05/23/22 05/30/22 Yes Wynona Dove A, DO  cholecalciferol (VITAMIN D3) 25 MCG (1000 UNIT) tablet Take 2,000 Units by mouth daily.   Yes [provider]  clindamycin (CLINDAGEL) 1 % gel Apply topically 2 (two) times daily. 05/10/22  Yes [provider]  cycloSPORINE (RESTASIS) 0.05 % ophthalmic emulsion Place 1 drop into both eyes 2 (two) times daily.   Yes [provider]  dicyclomine (BENTYL) 10 MG capsule Take 1 capsule (10 mg total) by mouth 2 (two) times daily as needed. 11/15/21  Yes [name], Mechele Dawley, MD  Dulaglutide (TRULICITY) 3.23 FT/7.3UK SOPN Inject 0.75 mg into the skin once a week. 03/02/21  Yes Ailene Ards, NP  DULoxetine (CYMBALTA) 60 MG capsule TAKE 1 CAPSULE(60 MG) BY MOUTH TWICE DAILY 05/17/22  Yes Cloria Spring, MD  esomeprazole (NEXIUM) 40 MG capsule TAKE 1 CAPSULE(40 MG) BY MOUTH EVERY MORNING Patient taking differently: Take 40 mg by mouth in the morning. 04/26/22  Yes Harvel Quale, MD  FARXIGA 10 MG TABS tablet Take 10 mg by mouth daily. 11/05/21  Yes [provider]  fexofenadine (ALLEGRA) 180 MG tablet Take 180 mg by mouth daily.   Yes [provider]  gabapentin (NEURONTIN) 100 MG capsule Take 100 mg by mouth 3 (three) times daily.   Yes [provider]  HYDROcodone-acetaminophen (NORCO/VICODIN) 5-325 MG tablet Take 1 tablet by mouth 2 (two) times daily as needed for moderate pain. 0.5 -1 po bid prn   Yes  [provider]  hydroxychloroquine (PLAQUENIL) 200 MG tablet Take 1 tablet (200 mg total) by mouth 2 (two) times daily. 03/20/22  Yes Rice, Resa Miner, MD  lisinopril (ZESTRIL) 40 MG tablet TAKE 1 TABLET(40 MG) BY MOUTH DAILY Patient taking differently: Take 40 mg by mouth daily. 12/02/20  Yes Gosrani, Nimish C, MD  metFORMIN (GLUCOPHAGE) 1000 MG tablet Take 1 tablet (1,000 mg total) by mouth 2 (two) times daily with a meal. 08/04/20  Yes Gosrani, Nimish C, MD  methocarbamol (ROBAXIN) 500 MG tablet Take 500 mg by mouth 2 (two) times daily as needed for muscle spasms.   Yes [provider]  metoCLOPramide (  REGLAN) 5 MG tablet Take 1 tablet (5 mg total) by mouth 3 (three) times daily before meals. 05/23/22  Yes Harvel Quale, MD  Multiple Vitamins-Minerals (CENTRUM) tablet Take 1 tablet by mouth daily.   Yes [provider]  nitroGLYCERIN (NITROLINGUAL) 0.4 MG/SPRAY spray Place 1 spray under the tongue every 5 (five) minutes x 3 doses as needed for chest pain. 02/14/22  Yes [provider]  NP THYROID 60 MG tablet TAKE 1 TABLET(60 MG) BY MOUTH DAILY BEFORE BREAKFAST Patient taking differently: Take 60 mg by mouth daily before breakfast. 06/01/20  Yes Gosrani, Nimish C, MD  Polyvinyl Alcohol-Povidone (REFRESH OP) Place 1 drop into both eyes daily as needed (dry eyes).   Yes [provider]  prazosin (MINIPRESS) 2 MG capsule TAKE 1 CAPSULE(2 MG) BY MOUTH AT BEDTIME 05/17/22  Yes Cloria Spring, MD  PRESCRIPTION MEDICATION Take 10 mg by mouth 3 (three) times daily before meals. This is MOTILLIUM ( DOMPERIDONE 10 mg) Tablet 02/14/20  Yes [provider]  PREVIDENT 5000 PLUS 1.1 % CREA dental cream Place 1 Application onto teeth every morning. 01/17/22  Yes [provider]  Probiotic Product (PROBIOTIC PO) Take 1 capsule by mouth daily.   Yes [provider]  propafenone (RYTHMOL SR) 225 MG 12 hr capsule Take 225 mg by mouth  every 12 (twelve) hours.   Yes [provider]  REPATHA 140 MG/ML SOSY Inject 1 mL into the skin every 14 (fourteen) days. 05/17/22  Yes [provider]  rosuvastatin (CRESTOR) 40 MG tablet Take 40 mg by mouth daily. 02/10/20  Yes [provider]  spironolactone (ALDACTONE) 25 MG tablet Take 25 mg by mouth daily. 09/11/21  Yes [provider]  ACCU-CHEK GUIDE test strip USE TO [place] DAILY 06/17/21   [provider]  Accu-Chek Softclix Lancets lancets CHECK SUGAR ONCE DAILY 08/11/20   [provider]  amphetamine-dextroamphetamine (ADDERALL) 20 MG tablet Take 1 tablet (20 mg total) by mouth 2 (two) times daily. Patient not taking: Reported on 05/23/2022 05/17/22 05/17/23  Cloria Spring, MD  amphetamine-dextroamphetamine (ADDERALL) 20 MG tablet Take 1 tablet (20 mg total) by mouth 2 (two) times daily. Patient not taking: Reported on 05/23/2022 05/17/22 05/17/23  Cloria Spring, MD  mirabegron ER (MYRBETRIQ) 50 MG TB24 tablet Take 1 tablet (50 mg total) by mouth daily. Patient not taking: Reported on 04/18/2022 11/22/20   Cleon Gustin, MD      Allergies    Doxycycline, Fentanyl, Cefoxitin, Ceftin [cefuroxime axetil], Iodinated contrast media, Iohexol, Norvasc [amlodipine besylate], Trintellix [vortioxetine], Wellbutrin [bupropion hcl], Latex, and Tape    Review of Systems   Review of Systems  Constitutional:  Negative for chills and fever.  HENT:  Negative for facial swelling and trouble swallowing.   Eyes:  Negative for photophobia and visual disturbance.  Respiratory:  Negative for cough and shortness of breath.   Cardiovascular:  Negative for chest pain and palpitations.  Gastrointestinal:  Negative for abdominal pain, nausea and vomiting.  Endocrine: Negative for polydipsia and polyuria.  Genitourinary:  Positive for frequency and urgency. Negative for difficulty urinating and hematuria.  Musculoskeletal:  Negative  for gait problem and joint swelling.  Skin:  Negative for pallor and rash.  Neurological:  Negative for syncope and headaches.  Psychiatric/Behavioral:  Negative for agitation and confusion.     Physical Exam Updated Vital Signs BP 103/80   Pulse 95   Resp 16   Wt  87.1 kg   SpO2 100%   BMI 37.52 kg/m  Physical Exam Vitals and nursing note reviewed.  Constitutional:      General: She is not in acute distress.    Appearance: Normal appearance.  HENT:     Head: Normocephalic and atraumatic.     Right Ear: External ear normal.     Left Ear: External ear normal.     Nose: Nose normal.     Mouth/Throat:     Mouth: Mucous membranes are moist.  Eyes:     General: No scleral icterus.       Right eye: No discharge.        Left eye: No discharge.  Cardiovascular:     Rate and Rhythm: Normal rate and regular rhythm.     Pulses: Normal pulses.     Heart sounds: Normal heart sounds.  Pulmonary:     Effort: Pulmonary effort is normal. No respiratory distress.     Breath sounds: Normal breath sounds.  Abdominal:     General: Abdomen is flat.     Tenderness: There is no abdominal tenderness.  Musculoskeletal:        General: Normal range of motion.     Cervical back: Normal range of motion.     Right lower leg: No edema.     Left lower leg: No edema.  Skin:    General: Skin is warm and dry.     Capillary Refill: Capillary refill takes less than 2 seconds.  Neurological:     Mental Status: She is alert and oriented to person, place, and time.     GCS: GCS eye subscore is 4. GCS verbal subscore is 5. GCS motor subscore is 6.  Psychiatric:        Mood and Affect: Mood normal.        Behavior: Behavior normal.     ED Results / Procedures / Treatments   Labs (all labs ordered are listed, but only abnormal results are displayed) Labs Reviewed  COMPREHENSIVE METABOLIC PANEL - Abnormal; Notable for the following components:      Result Value   Glucose, Bld 108 (*)    All other  components within normal limits  URINALYSIS, ROUTINE W REFLEX MICROSCOPIC - Abnormal; Notable for the following components:   APPearance CLOUDY (*)    Glucose, UA >=500 (*)    Hgb urine dipstick SMALL (*)    Leukocytes,Ua LARGE (*)    WBC, UA >50 (*)    Bacteria, UA MANY (*)    All other components within normal limits  TSH - Abnormal; Notable for the following components:   TSH 4.616 (*)    All other components within normal limits  URINE CULTURE  CBC  MAGNESIUM    EKG EKG Interpretation  Date/Time:  Tuesday May 23 2022 16:39:45 EST Ventricular Rate:  100 PR Interval:  177 QRS Duration: 99 QT Interval:  352 QTC Calculation: 454 R Axis:   84 Text Interpretation: Sinus tachycardia Low voltage, precordial leads similar to prior, rate slightly increased from prior Confirmed by Wynona Dove ([id number]) on 05/23/2022 9:24:41 PM  Radiology No results found.  Procedures Procedures    Medications Ordered in ED Medications - No data to display  ED Course/ Medical Decision Making/ A&P                           Medical Decision Making Amount and/or Complexity of Data Reviewed Labs: ordered.  Risk Prescription drug management.   This patient presents to the ED with chief complaint(s) of abnormal lab with pertinent past medical history of spironolactone use which further complicates the presenting complaint. The complaint involves an extensive differential diagnosis and also carries with it a high risk of complications and morbidity.    The differential diagnosis includes but not limited to metabolic, endocrine, medication effect, dehydration, renal impairment, etc. Serious etiologies were considered.   The initial plan is to screen labs   Additional history obtained: Additional history obtained from spouse Records reviewed previous admission documents, Primary Care Documents, and prior cardiology notes, prior labs and imaging  Independent labs interpretation:  The  following labs were independently interpreted:  Urinalysis concerning for infection, there is also contamination, she is having urgency and increased frequency.  We will go and treat for UTI.  Start Keflex, send culture CMP stable, potassium is 5.0.  Progressive improvement from elevated potassium 2 days ago.  Renal function stable CBC reviewed and stable TSH is mildly elevated, advise she follow-up with PCP  Independent visualization of imaging:  Cardiac monitoring was reviewed and interpreted by myself which shows NSR EKG w/ sinus tachy  Treatment and Reassessment: She stayed the same  Consultation: - Consulted or discussed management/test interpretation w/ external professional: na  Consideration for admission or further workup: Admission was considered    Patient here for abnormal labs, her potassium is normalized.  Found to have UTI.  Start Keflex.  Advise she follow-up with PCP and cardiology.  The patient improved significantly and was discharged in stable condition. Detailed discussions were had with the patient regarding current findings, and need for close f/u with PCP or on call doctor. The patient has been instructed to return immediately if the symptoms worsen in any way for re-evaluation. Patient verbalized understanding and is in agreement with current care plan. All questions answered prior to discharge.    Social Determinants of health: Social History   Tobacco Use   Smoking status: Former    Packs/day: 1.50    Years: 25.00    Total pack years: 37.50    Types: Cigarettes    Quit date: 04/25/2013    Years since quitting: 9.0   Smokeless tobacco: Never  Vaping Use   Vaping Use: Some days  Substance Use Topics   Alcohol use: No   Drug use: No            Final Clinical Impression(s) / ED Diagnoses Final diagnoses:  Urinary tract infection with hematuria, site unspecified  Abnormal laboratory test    Rx / DC Orders ED Discharge Orders           Ordered    cephALEXin (KEFLEX) 500 MG capsule  3 times daily        05/23/22 2120              Jeanell Sparrow, DO 05/23/22 2125

## 2022-05-23 NOTE — ED Triage Notes (Signed)
Pt sent by GI doctor today for K of 5.8. Pt denies any cp or other s/s. Followed by GI for ongoing med hx, but labs came back abnormal today

## 2022-05-23 NOTE — Telephone Encounter (Signed)
Hi Dane Bloch,  Can you please call the patient and tell the patient the CMP showed normal enzymes and normal IgG? No changes in azathioprine treatment. She needs to have a STAT BMP performed today as her K is very elevated, which could be a lab error. Please ask her to come to the office to get the order to check it STAT. If persistently elevated she will need to go to the ER to have treatment for hyperkalemia.  Thanks,  Maylon Peppers, MD Gastroenterology and Hepatology [facility]

## 2022-05-25 LAB — URINE CULTURE: Culture: NO GROWTH

## 2022-06-19 NOTE — Progress Notes (Deleted)
Office Visit Note  Patient: Lindsay Walsh             Date of Birth: Apr 07, 1971           MRN: [id number]             PCP: Dulce Sellar, MD Referring: Jani Gravel, MD Visit Date: 06/20/2022   Subjective:  No chief complaint on file.   History of Present Illness: Lindsay Walsh is a 51 y.o. female here for follow up for seronegative rheumatoid arthritis with RNP Abs and autoimmune hepatitis on HCQ 400 mg daily. Also FMS on cymbalta, gabapentin, and methocarbamol. Since last visit she went for left heart cath and being medically managed for CAD.***   Previous HPI 03/20/22 Lindsay Walsh is a 51 y.o. female here for follow up for suspected seronegative arthritis with positive RNP Abs and autoimmune hepatitis. She started taking hydroxychloroquine 200 mg daily for this and feels her symptoms are overall somewhat improved. She still has numbness and joint pains in bilateral hands. Not seeing significant swelling and no rashes. Labs reviewed from 8/21 at [facility] including CBC and LFTs were normal.   Previous HPI 11/16/2021 Lindsay Walsh is a 51 y.o. female here for follow up for abnormal lab findings and continued ongoing joint pain in multiple areas.  After our last visit did not recommend any specific new anti-inflammatory medication.  She feels overall symptoms are doing worse compared to a year ago.  She has pain pretty much all over.  Some days she feels a sensitivity and pain other times feels like she has more numbness or diminished sensation throughout.  She is getting swelling in her hands and feet that is worse by the end of the day.  She keeps persistent fatigue usually regardless of her sleep quality.  Mixed constipation and diarrhea symptoms.  Repeat laboratory testing by her pain management provider concerning for worsening lab markers for connective tissue disease or inflammation.  Liver function test been remaining stable on Imuran.   Previous HPI 06/16/2020 Lindsay Walsh is a 51 y.o. female here for follow up with hand pain and sometimes all over pain, positive RNP antibodies here for repeat assessment for inflammatory joint pain, nailfold capillaroscopy, and positive RNP test. She continues to have pain in multiple places including her hands, knees, also neck and upper back. No signficant changes since her initial visit.     No Rheumatology ROS completed.   PMFS History:  Patient Active Problem List   Diagnosis Date Noted   Progressive angina [facility])    Coronary artery calcification seen on CT scan    History of nuclear stress test    Abnormal nuclear stress test 04/11/2022   Atypical angina 04/11/2022   Bilateral carpal tunnel syndrome 04/05/2022   Positive ANA (antinuclear antibody) 11/16/2021   History of colonic polyps 11/15/2021   Bilateral hand pain 06/02/2020   OAB (overactive bladder) 11/05/2019   Recurrent UTI 11/05/2019   Diabetic gastroparesis ([place]) 08/21/2019   Nausea and vomiting 07/15/2019   Diarrhea 07/15/2019   Abnormal finding on urinalysis 07/15/2019   Morbid (severe) obesity due to excess calories (HCC)    Major depressive disorder, single episode, unspecified    Restless legs syndrome    Sleep apnea    Fibromyalgia    Hereditary and idiopathic neuropathy, unspecified    Nonspecific mesenteric lymphadenitis    Pain in right foot    Type 2 diabetes mellitus with hyperglycemia ([place])  Hypothyroidism 08/22/2018   DM type 2 causing vascular disease ([place]) 08/21/2018   Hyperlipidemia associated with type 2 diabetes mellitus ([place]) 08/21/2018   Essential hypertension, benign 08/21/2018   Influenza A 08/10/2018   SIRS (systemic inflammatory response syndrome) ([place]) 08/10/2018   Cerebral infarction ([place]) 03/05/2015   Dyspareunia 05/06/2014   Obesity 09/30/2013   Bilateral hip pain 07/16/2013   Chronic pain syndrome 07/16/2013   Degenerative disc disease, lumbar 07/16/2013   Greater trochanteric bursitis of both hips  07/16/2013   Nightmares associated with chronic post-traumatic stress disorder 10/18/2012   Sprain of foot 03/14/2012   Weakness 01/29/2012   IBS (irritable bowel syndrome) 08/01/2011   Gastroesophageal reflux disease    Depression    Chest pain    Fasting hyperglycemia    TOBACCO ABUSE 04/28/2010   NARCOTIC ABUSE 04/28/2010   PULMONARY NODULE 04/28/2010   Autoimmune hepatitis ([place]) 04/28/2010   Myalgia and myositis 01/06/2009    Past Medical History:  Diagnosis Date   Acid reflux    Allergic rhinitis due to pollen    Anxiety    Arthritis    back   Autoimmune hepatitis ([place])    Back pain    Connective tissue disease ([place])    Depression    Dizziness and giddiness    Dyspareunia 05/06/2014   Dysrhythmia    palpatations; on propafenone   Essential (primary) hypertension    Fibromyalgia    Gastroesophageal reflux disease    Chronic abdominal pain; gastroparesis; globus hystericus; irritable bowel syndrome   Glaucoma    Hereditary and idiopathic neuropathy, unspecified    Hyperlipidemia    Irritable bowel syndrome    Major depressive disorder, single episode, unspecified    Morbid (severe) obesity due to excess calories (HCC)    Narcotic dependence ([place])    Nonspecific mesenteric lymphadenitis    Overweight(278.02)    Pain in limb    Pain in right foot    PTSD (post-traumatic stress disorder)    Pulmonary nodule    Restless legs syndrome    Sleep apnea    on CPAP machine   Stroke ([place]) 2016   Systemic lupus erythematosus (SLE) in adult [facility])    Tobacco abuse    one pack per day; 35 pack years; quit in February 2023   Type 2 diabetes mellitus with hyperglycemia ([place])     Family History  Problem Relation Age of Onset   Diabetes Mother    Hypertension Mother    Anxiety disorder Mother    Depression Mother    Drug abuse Mother    Heart disease Father    Diabetes Father    Anxiety disorder Father    Alcohol abuse Father    Depression Father    OCD Father     Thyroid disease Sister    Healthy Brother    Healthy Daughter    Healthy Son    ADD / ADHD Son    Alcohol abuse Paternal Grandfather    Depression Paternal Grandfather    Cancer Paternal Grandfather        lung,skin   Tuberculosis Paternal Grandfather    Seizures Paternal Grandmother    Lupus Paternal Grandmother    ADD / ADHD Son    Anesthesia problems Neg Hx    Malignant hyperthermia Neg Hx    Pseudochol deficiency Neg Hx    Hypotension Neg Hx    Bipolar disorder Neg Hx    Dementia Neg Hx    Paranoid behavior Neg Hx  Schizophrenia Neg Hx    Sexual abuse Neg Hx    Physical abuse Neg Hx    Past Surgical History:  Procedure Laterality Date   ABDOMINAL HYSTERECTOMY     TAH&BSO   BACK SURGERY     BLADDER SUSPENSION  09/12/2011   Procedure: TRANSVAGINAL TAPE (TVT) PROCEDURE;  Surgeon: [name], MD;  Location: [facility];  Service: Urology;  Laterality: N/A;   CESAREAN SECTION     X3   CHOLECYSTECTOMY     ESOPHAGOGASTRODUODENOSCOPY ENDOSCOPY     "throat stretched" per pt.   INTRAVASCULAR PRESSURE WIRE/FFR STUDY N/A 04/21/2022   Procedure: INTRAVASCULAR PRESSURE WIRE/FFR STUDY;  Surgeon: Leonie Man, MD;  Location: [place] [facility];  Service: Cardiovascular;  Laterality: N/A;   LEFT HEART CATH AND CORONARY ANGIOGRAPHY N/A 04/21/2022   Procedure: LEFT HEART CATH AND CORONARY ANGIOGRAPHY;  Surgeon: Leonie Man, MD;  Location: [place] [facility];  Service: Cardiovascular;  Laterality: N/A;   LIVER BIOPSY     x4   LUMBAR EPIDURAL INJECTION  01/2012   TOTAL ABDOMINAL HYSTERECTOMY W/ BILATERAL SALPINGOOPHORECTOMY     TUBAL LIGATION     Bilateral   UPPER GASTROINTESTINAL ENDOSCOPY  09/13/2010   Social History   Social History Narrative   Divorced since 1994.Lives with boyfriend of 11 years.On disability.   Immunization History  Administered Date(s) Administered   Influenza Inj Mdck Quad Pf 04/20/2016   Influenza Split 04/07/2013, 05/17/2017    Influenza,inj,Quad PF,6+ Mos 05/17/2017, 04/04/2018, 03/31/2019, 04/28/2019   Influenza-Unspecified 04/17/2012, 03/20/2014, 08/09/2014, 05/06/2015   Moderna Sars-Covid-2 Vaccination 02/06/2020, 03/05/2020   Pneumococcal Conjugate-13 12/18/2019   Pneumococcal Polysaccharide-23 04/20/2016     Objective: Vital Signs: There were no vitals taken for this visit.   Physical Exam   Musculoskeletal Exam: ***  CDAI Exam: CDAI Score: -- Patient Global: --; Provider Global: -- Swollen: --; Tender: -- Joint Exam 06/20/2022   No joint exam has been documented for this visit   There is currently no information documented on the homunculus. Go to the Rheumatology activity and complete the homunculus joint exam.  Investigation: No additional findings.  Imaging: No results found.  Recent Labs: Lab Results  Component Value Date   WBC 7.9 05/23/2022   HGB 12.4 05/23/2022   PLT 268 05/23/2022   NA 138 05/23/2022   K 5.0 05/23/2022   CL 107 05/23/2022   CO2 24 05/23/2022   GLUCOSE 108 (H) 05/23/2022   BUN 16 05/23/2022   CREATININE 0.86 05/23/2022   BILITOT 0.4 05/23/2022   ALKPHOS 70 05/23/2022   AST 25 05/23/2022   ALT 22 05/23/2022   PROT 7.2 05/23/2022   ALBUMIN 3.9 05/23/2022   CALCIUM 9.0 05/23/2022   GFRAA 101 08/03/2020    Speciality Comments: Left message for patient to call us for PLQ eye exam.  Procedures:  No procedures performed Allergies: Doxycycline, Fentanyl, Cefoxitin, Ceftin [cefuroxime axetil], Iodinated contrast media, Iohexol, Norvasc [amlodipine besylate], Trintellix [vortioxetine], Wellbutrin [bupropion hcl], Latex, and Tape   Assessment / Plan:     Visit Diagnoses: No diagnosis found.  ***  Orders: No orders of the defined types were placed in this encounter.  No orders of the defined types were placed in this encounter.    Follow-Up Instructions: No follow-ups on file.   Collier Salina, MD  Note - This record has been created using  [employer].  Chart creation errors have been sought, but may not always  have been located. Such creation  errors do not reflect on  the standard of medical care.

## 2022-06-20 ENCOUNTER — Ambulatory Visit: Payer: Medicare Other | Attending: Internal Medicine | Admitting: Internal Medicine

## 2022-06-21 ENCOUNTER — Ambulatory Visit (INDEPENDENT_AMBULATORY_CARE_PROVIDER_SITE_OTHER): Payer: Medicare Other | Admitting: Clinical

## 2022-06-21 DIAGNOSIS — F900 Attention-deficit hyperactivity disorder, predominantly inattentive type: Secondary | ICD-10-CM | POA: Diagnosis not present

## 2022-06-21 DIAGNOSIS — F4310 Post-traumatic stress disorder, unspecified: Secondary | ICD-10-CM | POA: Diagnosis not present

## 2022-06-21 DIAGNOSIS — F32A Depression, unspecified: Secondary | ICD-10-CM | POA: Diagnosis not present

## 2022-06-21 NOTE — Progress Notes (Signed)
Virtual Visit via Telephone Note   I connected with Lindsay Walsh on 06/21/22 at  3:00 PM EST by telephone and verified that I am speaking with the correct person using two identifiers.   Location: Patient: Home Provider: Office   I discussed the limitations, risks, security and privacy concerns of performing an evaluation and management service by telephone and the availability of in person appointments. I also discussed with the patient that there may be a patient responsible charge related to this service. The patient expressed understanding and agreed to proceed.   THERAPIST PROGRESS NOTE   Session Time: 3:00 PM-3:30 PM   Participation Level: Active   Behavioral Response: CasualAlertDepressed   Type of Therapy: Individual Therapy   Treatment Goals addressed: Coping   Interventions: CBT   Summary: [name] is a 51 y.o. female who presents with Depression, ADHD, PTSD  . The OPT therapist worked with the patient for her ongoing OPT treatment session. The OPT therapist utilized Motivational Interviewing to assist in creating therapeutic repore. The patient in the session was engaged and work in collaboration giving feedback about her triggers and symptoms over the past few weeks. The patient spoke about working to implement coping taking walks, going on [employer], socializing, and community outings. The patient identified difficulty with health and is now seeing a cardiologist after finding a 60% blockage from which the patient had to get stints.The OPT therapist utilized Cognitive Behavioral Therapy through cognitive restructuring as well as worked with the patient on coping strategies to assist in management of mood. The patient identified interactions with certain family members as triggers and her realization that she had to work on limiting her interaction and her reactive behavior using coping when needed such as walking away or getting off the phone to help her not get  upset. The patient spoke about her intent to be more self focused on her health and happiness moving forward. The patient spoke about her recent Thanksgiving holiday and plans for the upcoming Christmas holiday. The patient will be updating her Clinical Assessment next session.   Suicidal/Homicidal: Nowithout intent/plan   Therapist Response: The OPT therapist worked with the patient for the patients scheduled session. The patient was engaged in her session and gave feedback in relation to triggers, symptoms, and behavior responses over the past few weeks. The OPT therapist worked with the patient utilizing an in session Cognitive Behavioral Therapy exercise. The patient was responsive in the session and verbalized," I know I have to start being more self focused I always put everyone elses needs before mine and I have to focus on my health". The OPT therapist worked with the patient on placing her energy/focus/and attention in areas of her life she has control over and doing her self check ins throughout the week.  .The OPT therapist will continue treatment work with the patient in her next scheduled session   Plan: Return again in 3 weeks.   Diagnosis       Axis I: Depressive Disorder unspecified, ADHD inattentive type, PTSD.                             Axis II: No diagnosis     Collaboration of Care: No additional collaboration of care for this session.   Patient/Guardian was advised Release of Information must be obtained prior to any record release in order to collaborate their care with an outside provider. Patient/Guardian was advised  if they have not already done so to contact the registration department to sign all necessary forms in order for [place] to release information regarding their care.    Consent: Patient/Guardian gives verbal consent for treatment and assignment of benefits for services provided during this visit. Patient/Guardian expressed understanding and agreed to proceed.    I  discussed the assessment and treatment plan with the patient. The patient was provided an opportunity to ask questions and all were answered. The patient agreed with the plan and demonstrated an understanding of the instructions.   The patient was advised to call back or seek an in-person evaluation if the symptoms worsen or if the condition fails to improve as anticipated.   I provided 30 minutes of non-face-to-face time during this encounter.   Lennox Grumbles, LCSW    06/21/2022

## 2022-07-25 ENCOUNTER — Ambulatory Visit (HOSPITAL_COMMUNITY): Payer: 59 | Admitting: Clinical

## 2022-07-30 ENCOUNTER — Encounter (HOSPITAL_COMMUNITY): Payer: Self-pay | Admitting: Emergency Medicine

## 2022-07-30 ENCOUNTER — Emergency Department (HOSPITAL_COMMUNITY)
Admission: EM | Admit: 2022-07-30 | Discharge: 2022-07-30 | Disposition: A | Discharge status: Home / Self Care | Payer: 59 | Attending: Emergency Medicine | Admitting: Emergency Medicine

## 2022-07-30 ENCOUNTER — Other Ambulatory Visit: Payer: Self-pay

## 2022-07-30 ENCOUNTER — Emergency Department (HOSPITAL_COMMUNITY): Payer: 59

## 2022-07-30 DIAGNOSIS — R0602 Shortness of breath: Secondary | ICD-10-CM | POA: Diagnosis not present

## 2022-07-30 DIAGNOSIS — R051 Acute cough: Secondary | ICD-10-CM

## 2022-07-30 LAB — CBC WITH DIFFERENTIAL/PLATELET
Abs Immature Granulocytes: 0.02 10*3/uL (ref 0.00–0.07)
Basophils Absolute: 0.1 10*3/uL (ref 0.0–0.1)
Basophils Relative: 1 %
Eosinophils Absolute: 0.1 10*3/uL (ref 0.0–0.5)
Eosinophils Relative: 2 %
HCT: 38.2 % (ref 36.0–46.0)
Hemoglobin: 12.4 g/dL (ref 12.0–15.0)
Immature Granulocytes: 0 %
Lymphocytes Relative: 16 %
Lymphs Abs: 1.2 10*3/uL (ref 0.7–4.0)
MCH: 30.8 pg (ref 26.0–34.0)
MCHC: 32.5 g/dL (ref 30.0–36.0)
MCV: 95 fL (ref 80.0–100.0)
Monocytes Absolute: 0.6 10*3/uL (ref 0.1–1.0)
Monocytes Relative: 8 %
Neutro Abs: 5.8 10*3/uL (ref 1.7–7.7)
Neutrophils Relative %: 73 %
Platelets: 238 10*3/uL (ref 150–400)
RBC: 4.02 MIL/uL (ref 3.87–5.11)
RDW: 12.6 % (ref 11.5–15.5)
WBC: 7.8 10*3/uL (ref 4.0–10.5)
nRBC: 0 % (ref 0.0–0.2)

## 2022-07-30 LAB — COMPREHENSIVE METABOLIC PANEL
ALT: 19 U/L (ref 0–44)
AST: 24 U/L (ref 15–41)
Albumin: 3.7 g/dL (ref 3.5–5.0)
Alkaline Phosphatase: 78 U/L (ref 38–126)
Anion gap: 7 (ref 5–15)
BUN: 11 mg/dL (ref 6–20)
CO2: 27 mmol/L (ref 22–32)
Calcium: 8.4 mg/dL — ABNORMAL LOW (ref 8.9–10.3)
Chloride: 104 mmol/L (ref 98–111)
Creatinine, Ser: 0.76 mg/dL (ref 0.44–1.00)
GFR, Estimated: >60 mL/min (ref >60)
Glucose, Bld: 129 mg/dL — ABNORMAL HIGH (ref 70–99)
Potassium: 4.5 mmol/L (ref 3.5–5.1)
Sodium: 138 mmol/L (ref 135–145)
Total Bilirubin: 0.4 mg/dL (ref 0.3–1.2)
Total Protein: 6.9 g/dL (ref 6.5–8.1)

## 2022-07-30 LAB — TROPONIN I (HIGH SENSITIVITY)
Troponin I (High Sensitivity): 2 ng/L (ref <18)
Troponin I (High Sensitivity): 2 ng/L (ref <18)

## 2022-07-30 MED ORDER — AZITHROMYCIN 250 MG PO TABS: ORAL_TABLET | ORAL | 0 refills | Status: DC

## 2022-07-30 MED ORDER — METHYLPREDNISOLONE SODIUM SUCC 125 MG IJ SOLR
125.0000 mg | Freq: Once | INTRAMUSCULAR | Status: AC
  Administered 2022-07-30: 125 mg via INTRAVENOUS
  Filled 2022-07-30: qty 2

## 2022-07-30 NOTE — ED Notes (Signed)
Pt ambulated self to restroom

## 2022-07-30 NOTE — Discharge Instructions (Signed)
Follow-up with your doctor if not improving

## 2022-07-30 NOTE — ED Triage Notes (Signed)
Pt arrived from home via [facility] w c/o SOB that started this morning. States when she woke up she had choking feeling, chest tightness and felt like she couldn't breath. Pt RA 100%, placed on 2L for comfort.

## 2022-07-30 NOTE — ED Notes (Signed)
Pt given a drink per MD approval.

## 2022-07-30 NOTE — ED Provider Notes (Signed)
[facility] Provider Note   CSN: [id number] Arrival date & time: 07/30/22  5462     History  Chief Complaint  Patient presents with   Shortness of Breath    Lindsay Walsh is a 52 y.o. female.  Patient has a history of lupus and diabetes.  Patient states she had shortness of breath and a cough.  The history is provided by the patient and medical records. No language interpreter was used.  Shortness of Breath Severity:  Mild Onset quality:  Sudden Timing:  Constant Progression:  Worsening Chronicity:  New Context: activity   Relieved by:  Nothing Worsened by:  Nothing Ineffective treatments:  None tried Associated symptoms: no abdominal pain, no chest pain, no cough, no headaches and no rash        Home Medications Prior to Admission medications   Medication Sig Start Date End Date Taking? Authorizing Provider  azithromycin (ZITHROMAX Z-PAK) 250 MG tablet 2 po day one, then 1 daily x 4 days 07/30/22  Yes Milton Ferguson, MD  ACCU-CHEK GUIDE test strip USE TO [place] DAILY 06/17/21   [provider]  Accu-Chek Softclix Lancets lancets CHECK SUGAR ONCE DAILY 08/11/20   [provider]  amphetamine-dextroamphetamine (ADDERALL) 20 MG tablet Take 1 tablet (20 mg total) by mouth 2 (two) times daily. 05/17/22   Cloria Spring, MD  amphetamine-dextroamphetamine (ADDERALL) 20 MG tablet Take 1 tablet (20 mg total) by mouth 2 (two) times daily. Patient not taking: Reported on 05/23/2022 05/17/22 05/17/23  Cloria Spring, MD  amphetamine-dextroamphetamine (ADDERALL) 20 MG tablet Take 1 tablet (20 mg total) by mouth 2 (two) times daily. Patient not taking: Reported on 05/23/2022 05/17/22 05/17/23  Cloria Spring, MD  aspirin EC 81 MG tablet Take 81 mg by mouth daily. Swallow whole.    [provider]  atenolol (TENORMIN) 100 MG tablet Take 100 mg by mouth daily. 05/05/22   [provider]   azaTHIOprine (IMURAN) 50 MG tablet Take 1 tablet (50 mg total) by mouth daily. 11/15/21   [name], Mechele Dawley, MD  bimatoprost (LUMIGAN) 0.01 % SOLN Place 1 drop into both eyes at bedtime.    [provider]  Calcium Citrate-Vitamin D3 (CALCIUM CITRATE +D) 315-6.25 MG-MCG TABS Take 1 tablet by mouth 2 (two) times daily.    [provider]  cholecalciferol (VITAMIN D3) 25 MCG (1000 UNIT) tablet Take 2,000 Units by mouth daily.    [provider]  clindamycin (CLINDAGEL) 1 % gel Apply topically 2 (two) times daily. 05/10/22   [provider]  cycloSPORINE (RESTASIS) 0.05 % ophthalmic emulsion Place 1 drop into both eyes 2 (two) times daily.    [provider]  dicyclomine (BENTYL) 10 MG capsule Take 1 capsule (10 mg total) by mouth 2 (two) times daily as needed. 11/15/21   [name], Mechele Dawley, MD  Dulaglutide (TRULICITY) 7.03 JK/0.9FG SOPN Inject 0.75 mg into the skin once a week. 03/02/21   Ailene Ards, NP  DULoxetine (CYMBALTA) 60 MG capsule TAKE 1 CAPSULE(60 MG) BY MOUTH TWICE DAILY 05/17/22   Cloria Spring, MD  esomeprazole (NEXIUM) 40 MG capsule TAKE 1 CAPSULE(40 MG) BY MOUTH EVERY MORNING Patient taking differently: Take 40 mg by mouth in the morning. 04/26/22   Harvel Quale, MD  FARXIGA 10 MG TABS tablet Take 10 mg by mouth daily. 11/05/21   [provider]  fexofenadine (ALLEGRA) 180 MG tablet Take  180 mg by mouth daily.    [provider]  gabapentin (NEURONTIN) 100 MG capsule Take 100 mg by mouth 3 (three) times daily.    [provider]  HYDROcodone-acetaminophen (NORCO/VICODIN) 5-325 MG tablet Take 1 tablet by mouth 2 (two) times daily as needed for moderate pain. 0.5 -1 po bid prn    [provider]  hydroxychloroquine (PLAQUENIL) 200 MG tablet Take 1 tablet (200 mg total) by mouth 2 (two) times daily. 03/20/22   Rice, Resa Miner, MD  lisinopril (ZESTRIL) 40 MG tablet TAKE 1 TABLET(40 MG) BY MOUTH  DAILY Patient taking differently: Take 40 mg by mouth daily. 12/02/20   Doree Albee, MD  metFORMIN (GLUCOPHAGE) 1000 MG tablet Take 1 tablet (1,000 mg total) by mouth 2 (two) times daily with a meal. 08/04/20   Gosrani, Nimish C, MD  methocarbamol (ROBAXIN) 500 MG tablet Take 500 mg by mouth 2 (two) times daily as needed for muscle spasms.    [provider]  metoCLOPramide (REGLAN) 5 MG tablet Take 1 tablet (5 mg total) by mouth 3 (three) times daily before meals. 05/23/22   Harvel Quale, MD  mirabegron ER (MYRBETRIQ) 50 MG TB24 tablet Take 1 tablet (50 mg total) by mouth daily. Patient not taking: Reported on 04/18/2022 11/22/20   Cleon Gustin, MD  Multiple Vitamins-Minerals (CENTRUM) tablet Take 1 tablet by mouth daily.    [provider]  nitroGLYCERIN (NITROLINGUAL) 0.4 MG/SPRAY spray Place 1 spray under the tongue every 5 (five) minutes x 3 doses as needed for chest pain. 02/14/22   [provider]  NP THYROID 60 MG tablet TAKE 1 TABLET(60 MG) BY MOUTH DAILY BEFORE BREAKFAST Patient taking differently: Take 60 mg by mouth daily before breakfast. 06/01/20   Doree Albee, MD  Polyvinyl Alcohol-Povidone (REFRESH OP) Place 1 drop into both eyes daily as needed (dry eyes).    [provider]  prazosin (MINIPRESS) 2 MG capsule TAKE 1 CAPSULE(2 MG) BY MOUTH AT BEDTIME 05/17/22   Cloria Spring, MD  PRESCRIPTION MEDICATION Take 10 mg by mouth 3 (three) times daily before meals. This is MOTILLIUM ( DOMPERIDONE 10 mg) Tablet 02/14/20   [provider]  PREVIDENT 5000 PLUS 1.1 % CREA dental cream Place 1 Application onto teeth every morning. 01/17/22   [provider]  Probiotic Product (PROBIOTIC PO) Take 1 capsule by mouth daily.    [provider]  propafenone (RYTHMOL SR) 225 MG 12 hr capsule Take 225 mg by mouth every 12 (twelve) hours.    [provider]  REPATHA 140 MG/ML SOSY Inject 1 mL into the skin  every 14 (fourteen) days. 05/17/22   [provider]  rosuvastatin (CRESTOR) 40 MG tablet Take 40 mg by mouth daily. 02/10/20   [provider]  spironolactone (ALDACTONE) 25 MG tablet Take 25 mg by mouth daily. 09/11/21   [provider]      Allergies    Doxycycline, Fentanyl, Cefoxitin, Ceftin [cefuroxime axetil], Iodinated contrast media, Iohexol, Norvasc [amlodipine besylate], Trintellix [vortioxetine], Wellbutrin [bupropion hcl], Latex, and Tape    Review of Systems   Review of Systems  Constitutional:  Negative for appetite change and fatigue.  HENT:  Negative for congestion, ear discharge and sinus pressure.   Eyes:  Negative for discharge.  Respiratory:  Positive for shortness of breath. Negative for cough.   Cardiovascular:  Negative for chest pain.  Gastrointestinal:  Negative for abdominal pain and diarrhea.  Genitourinary:  Negative for frequency and hematuria.  Musculoskeletal:  Negative for back pain.  Skin:  Negative for rash.  Neurological:  Negative for seizures and headaches.  Psychiatric/Behavioral:  Negative for hallucinations.     Physical Exam Updated Vital Signs BP 103/76   Pulse 98   Temp 98 F (36.7 C)   Resp 16   Ht 5' (1.524 m)   Wt 86.2 kg   SpO2 100%   BMI 37.11 kg/m  Physical Exam Vitals and nursing note reviewed.  Constitutional:      Appearance: She is well-developed.  HENT:     Head: Normocephalic.     Nose: Nose normal.  Eyes:     General: No scleral icterus.    Conjunctiva/sclera: Conjunctivae normal.  Neck:     Thyroid: No thyromegaly.  Cardiovascular:     Rate and Rhythm: Normal rate and regular rhythm.     Heart sounds: No murmur heard.    No friction rub. No gallop.  Pulmonary:     Breath sounds: No stridor. Wheezing present. No rales.  Chest:     Chest wall: No tenderness.  Abdominal:     General: There is no distension.     Tenderness: There is no abdominal tenderness. There is no rebound.   Musculoskeletal:        General: Normal range of motion.     Cervical back: Neck supple.  Lymphadenopathy:     Cervical: No cervical adenopathy.  Skin:    Coloration: Jaundice: .mil.     Findings: No erythema or rash.  Neurological:     Mental Status: She is alert and oriented to person, place, and time.     Motor: No abnormal muscle tone.     Coordination: Coordination normal.  Psychiatric:        Behavior: Behavior normal.     ED Results / Procedures / Treatments   Labs (all labs ordered are listed, but only abnormal results are displayed) Labs Reviewed  COMPREHENSIVE METABOLIC PANEL - Abnormal; Notable for the following components:      Result Value   Glucose, Bld 129 (*)    Calcium 8.4 (*)    All other components within normal limits  CBC WITH DIFFERENTIAL/PLATELET  TROPONIN I (HIGH SENSITIVITY)  TROPONIN I (HIGH SENSITIVITY)    EKG EKG Interpretation  Date/Time:  Sunday July 30 2022 07:12:15 EST Ventricular Rate:  88 PR Interval:  173 QRS Duration: 96 QT Interval:  369 QTC Calculation: 447 R Axis:   89 Text Interpretation: Sinus rhythm Low voltage, precordial leads Nonspecific T abnormalities, lateral leads Confirmed by Milton Ferguson [phone]) on 07/30/2022 12:44:00 PM  Radiology DG Chest 2 View  Result Date: 07/30/2022 CLINICAL DATA:  Follow-up left upper lobe nodule. EXAM: CHEST - 2 VIEW COMPARISON:  08/10/2018 FINDINGS: Heart size and mediastinal contours appear normal. No pleural effusion or edema. No airspace opacities. The apparent nodular density noted in the left upper lobe in the projection of the anterior aspect of the left third rib corresponds to calcifications within the costochondral cartilage in appears similar to 08/10/2018. IMPRESSION: 1. No active cardiopulmonary disease. 2. Left upper lobe nodular density corresponds to calcifications within the costochondral cartilage. Electronically Signed   By: Kerby Moors M.D.   On: 07/30/2022 12:20    DG Chest Port 1 View  Result Date: 07/30/2022 CLINICAL DATA:  Shortness of breath. EXAM: PORTABLE CHEST 1 VIEW COMPARISON:  08/10/2018 FINDINGS: Heart size appears normal. There is no pleural effusion or edema identified.  No airspace opacities identified. Nodular density projecting over the left upper lobe is indeterminate measuring 1 cm. Not confidently identified on previous imaging. This may represent calcification of the third left rib costal cartilage. Visualized osseous structures are unremarkable. IMPRESSION: 1. No acute cardiopulmonary abnormalities. 2. Indeterminate 1 cm nodular density projecting over the left upper lobe. This may represent calcification of the third left rib costal cartilage. Recommend upright PA and lateral chest radiograph. Electronically Signed   By: Kerby Moors M.D.   On: 07/30/2022 07:51    Procedures Procedures    Medications Ordered in ED Medications  methylPREDNISolone sodium succinate (SOLU-MEDROL) 125 mg/2 mL injection 125 mg (125 mg Intravenous Given 07/30/22 1540)    ED Course/ Medical Decision Making/ A&P  Patient shortness of breath and cough improved with neb treatment and steroids.                           Medical Decision Making Amount and/or Complexity of Data Reviewed Labs: ordered. Radiology: ordered. ECG/medicine tests: ordered.  Risk Prescription drug management.  This patient presents to the ED for concern of cough and shortness of breath, this involves an extensive number of treatment options, and is a complaint that carries with it a high risk of complications and morbidity.  The differential diagnosis includes pneumonia, bronchitis   Co morbidities that complicate the patient evaluation  Lipids and diabetes   Additional history obtained:  Additional history obtained from patient External records from outside source obtained and reviewed including hospital records   Lab Tests:  I Ordered, and personally interpreted  labs.  The pertinent results include: CBC and chemistries which were unremarkable   Imaging Studies ordered:  I ordered imaging studies including chest x-ray I independently visualized and interpreted imaging which showed negative I agree with the radiologist interpretation   Cardiac Monitoring: / EKG:  The patient was maintained on a cardiac monitor.  I personally viewed and interpreted the cardiac monitored which showed an underlying rhythm of: Normal sinus rhythm   Consultations Obtained:  No consultant  Problem List / ED Course / Critical interventions / Medication management  Lupus, diabetes, cough I ordered medication including prednisone Reevaluation of the patient after these medicines showed that the patient stayed the same I have reviewed the patients home medicines and have made adjustments as needed   Social Determinants of Health:  None   Test / Admission - Considered:  None  Patient with bronchitis.  She is placed on Z-Pak and will follow-up with her PCP        Final Clinical Impression(s) / ED Diagnoses Final diagnoses:  Shortness of breath  Acute cough    Rx / DC Orders ED Discharge Orders          Ordered    azithromycin (ZITHROMAX Z-PAK) 250 MG tablet        07/30/22 1249              Milton Ferguson, MD 08/01/22 [phone]

## 2022-08-01 ENCOUNTER — Other Ambulatory Visit (INDEPENDENT_AMBULATORY_CARE_PROVIDER_SITE_OTHER): Payer: Self-pay | Admitting: Gastroenterology

## 2022-08-17 ENCOUNTER — Telehealth (HOSPITAL_COMMUNITY): Payer: Medicare Other | Admitting: Psychiatry

## 2022-08-18 ENCOUNTER — Telehealth (INDEPENDENT_AMBULATORY_CARE_PROVIDER_SITE_OTHER): Payer: 59 | Admitting: Psychiatry

## 2022-08-18 ENCOUNTER — Encounter (HOSPITAL_COMMUNITY): Payer: Self-pay | Admitting: Psychiatry

## 2022-08-18 DIAGNOSIS — F515 Nightmare disorder: Secondary | ICD-10-CM | POA: Diagnosis not present

## 2022-08-18 DIAGNOSIS — F900 Attention-deficit hyperactivity disorder, predominantly inattentive type: Secondary | ICD-10-CM | POA: Diagnosis not present

## 2022-08-18 DIAGNOSIS — F4312 Post-traumatic stress disorder, chronic: Secondary | ICD-10-CM

## 2022-08-18 DIAGNOSIS — F4310 Post-traumatic stress disorder, unspecified: Secondary | ICD-10-CM

## 2022-08-18 MED ORDER — DULOXETINE HCL 60 MG PO CPEP: ORAL_CAPSULE | ORAL | 2 refills | Status: DC

## 2022-08-18 MED ORDER — AMPHETAMINE-DEXTROAMPHETAMINE 20 MG PO TABS: 20.0000 mg | ORAL_TABLET | Freq: Two times a day (BID) | ORAL | 0 refills | Status: DC

## 2022-08-18 MED ORDER — PRAZOSIN HCL 2 MG PO CAPS: ORAL_CAPSULE | ORAL | 2 refills | Status: DC

## 2022-08-18 NOTE — Progress Notes (Signed)
Virtual Visit via Video Note  I connected with Lindsay Walsh on 08/18/22 at 11:20 AM EST by a video enabled telemedicine application and verified that I am speaking with the correct person using two identifiers.  Location: Patient: home Provider: office   I discussed the limitations of evaluation and management by telemedicine and the availability of in person appointments. The patient expressed understanding and agreed to proceed.     I discussed the assessment and treatment plan with the patient. The patient was provided an opportunity to ask questions and all were answered. The patient agreed with the plan and demonstrated an understanding of the instructions.   The patient was advised to call back or seek an in-person evaluation if the symptoms worsen or if the condition fails to improve as anticipated.  I provided 20 minutes of non-face-to-face time during this encounter.   [name], MD  [facility] MD/PA/NP OP Progress Note  08/18/2022 11:16 AM Lindsay Walsh  MRN:  [id number]  Chief Complaint:  Chief Complaint  Patient presents with   Depression   Follow-up   ADD   HPI: This patient is a 52 year old divorced white female who lives with her boyfriend in [place]. She is on disability for an autoimmune liver disease. She has 3 children and one granddaughter    The patient returns for follow-up after 3 months regarding her depression anxiety PTSD and ADD.  She states that she is doing a lot of things to improve her health.  She is walking several times a week.  She is cut out sweets and and sodas and she has lost 14 pounds.  Since her cardiac catheterization a few months ago she is really made an effort to change things.  The exercise is really helping her mood and sleep.  She denies significant depression thoughts of self-harm or suicide.  She is sleeping well and denies any nightmares.  She continues to think the Adderall is helping her with her focus Visit Diagnosis:     ICD-10-CM   1. PTSD (post-traumatic stress disorder)  F43.10     2. Nightmares associated with chronic post-traumatic stress disorder  F51.5 prazosin (MINIPRESS) 2 MG capsule   F43.12     3. Attention deficit hyperactivity disorder (ADHD), predominantly inattentive type  F90.0       Past Psychiatric History: Hospitalization in early 20s for depression  Past Medical History:  Past Medical History:  Diagnosis Date   Acid reflux    Allergic rhinitis due to pollen    Anxiety    Arthritis    back   Autoimmune hepatitis ([place])    Back pain    Connective tissue disease ([place])    Depression    Dizziness and giddiness    Dyspareunia 05/06/2014   Dysrhythmia    palpatations; on propafenone   Essential (primary) hypertension    Fibromyalgia    Gastroesophageal reflux disease    Chronic abdominal pain; gastroparesis; globus hystericus; irritable bowel syndrome   Glaucoma    Hereditary and idiopathic neuropathy, unspecified    Hyperlipidemia    Irritable bowel syndrome    Major depressive disorder, single episode, unspecified    Morbid (severe) obesity due to excess calories (HCC)    Narcotic dependence (HCC)    Nonspecific mesenteric lymphadenitis    Overweight(278.02)    Pain in limb    Pain in right foot    PTSD (post-traumatic stress disorder)    Pulmonary nodule    Restless legs syndrome  Sleep apnea    on CPAP machine   Stroke ([place]) 2016   Systemic lupus erythematosus (SLE) in adult [facility])    Tobacco abuse    one pack per day; 35 pack years; quit in February 2023   Type 2 diabetes mellitus with hyperglycemia ([place])     Past Surgical History:  Procedure Laterality Date   ABDOMINAL HYSTERECTOMY     TAH&BSO   BACK SURGERY     BLADDER SUSPENSION  09/12/2011   Procedure: TRANSVAGINAL TAPE (TVT) PROCEDURE;  Surgeon: [name], MD;  Location: [facility];  Service: Urology;  Laterality: N/A;   CESAREAN SECTION     X3   CHOLECYSTECTOMY     ESOPHAGOGASTRODUODENOSCOPY  ENDOSCOPY     "throat stretched" per pt.   INTRAVASCULAR PRESSURE WIRE/FFR STUDY N/A 04/21/2022   Procedure: INTRAVASCULAR PRESSURE WIRE/FFR STUDY;  Surgeon: Leonie Man, MD;  Location: [place] [facility];  Service: Cardiovascular;  Laterality: N/A;   LEFT HEART CATH AND CORONARY ANGIOGRAPHY N/A 04/21/2022   Procedure: LEFT HEART CATH AND CORONARY ANGIOGRAPHY;  Surgeon: Leonie Man, MD;  Location: [place] [facility];  Service: Cardiovascular;  Laterality: N/A;   LIVER BIOPSY     x4   LUMBAR EPIDURAL INJECTION  01/2012   TOTAL ABDOMINAL HYSTERECTOMY W/ BILATERAL SALPINGOOPHORECTOMY     TUBAL LIGATION     Bilateral   UPPER GASTROINTESTINAL ENDOSCOPY  09/13/2010    Family Psychiatric History: See below  Family History:  Family History  Problem Relation Age of Onset   Diabetes Mother    Hypertension Mother    Anxiety disorder Mother    Depression Mother    Drug abuse Mother    Heart disease Father    Diabetes Father    Anxiety disorder Father    Alcohol abuse Father    Depression Father    OCD Father    Thyroid disease Sister    Healthy Brother    Healthy Daughter    Healthy Son    ADD / ADHD Son    Alcohol abuse Paternal Grandfather    Depression Paternal Grandfather    Cancer Paternal Grandfather        lung,skin   Tuberculosis Paternal Grandfather    Seizures Paternal Grandmother    Lupus Paternal Grandmother    ADD / ADHD Son    Anesthesia problems Neg Hx    Malignant hyperthermia Neg Hx    Pseudochol deficiency Neg Hx    Hypotension Neg Hx    Bipolar disorder Neg Hx    Dementia Neg Hx    Paranoid behavior Neg Hx    Schizophrenia Neg Hx    Sexual abuse Neg Hx    Physical abuse Neg Hx     Social History:  Social History   Socioeconomic History   Marital status: Divorced    Spouse name: Not on file   Number of children: 3   Years of education: GED   Highest education level: Not on file  Occupational History    Employer: NOT EMPLOYED  Tobacco  Use   Smoking status: Former    Packs/day: 1.50    Years: 25.00    Total pack years: 37.50    Types: Cigarettes    Quit date: 04/25/2013    Years since quitting: 9.3   Smokeless tobacco: Never  Vaping Use   Vaping Use: Some days  Substance and Sexual Activity   Alcohol use: No   Drug use: No   Sexual  activity: Yes    Birth control/protection: Surgical, Abstinence  Other Topics Concern   Not on file  Social History Narrative   Divorced since 1994.Lives with boyfriend of 11 years.On disability.   Social Determinants of Health   Financial Resource Strain: Not on file  Food Insecurity: Not on file  Transportation Needs: Not on file  Physical Activity: Not on file  Stress: Not on file  Social Connections: Not on file    Allergies:  Allergies  Allergen Reactions   Doxycycline Shortness Of Breath and Rash    Heart racing   Fentanyl Shortness Of Breath, Itching and Other (See Comments)    Heart racing, SOB   Cefoxitin     Unknown reaction   Ceftin [Cefuroxime Axetil]     Unknown reaction   Iodinated Contrast Media Other (See Comments)    Knots on body   Iohexol Other (See Comments)    Knots on body    Norvasc [Amlodipine Besylate]     Unknown reaction   Trintellix [Vortioxetine]     Tremors, heart race   Wellbutrin [Bupropion Hcl] Nausea And Vomiting   Latex Rash   Tape Rash    Metabolic Disorder Labs: Lab Results  Component Value Date   HGBA1C 11.4 (H) 08/03/2020   MPG 280 08/03/2020   MPG 237.43 08/11/2018   No results found for: "PROLACTIN" Lab Results  Component Value Date   CHOL 153 04/25/2019   TRIG 141 04/25/2019   HDL 37 04/25/2019   CHOLHDL 4.9 03/06/2015   VLDL 16 03/06/2015   LDLCALC 91 04/25/2019   LDLCALC 104 (H) 03/06/2015   Lab Results  Component Value Date   TSH 4.616 (H) 05/23/2022   TSH 2.79 08/03/2020    Therapeutic Level Labs: No results found for: "LITHIUM" No results found for: "VALPROATE" No results found for:  "CBMZ"  Current Medications: Current Outpatient Medications  Medication Sig Dispense Refill   ACCU-CHEK GUIDE test strip USE TO CHECK BLOOD SUGAR FOUR TIMES DAILY     Accu-Chek Softclix Lancets lancets CHECK SUGAR ONCE DAILY     amphetamine-dextroamphetamine (ADDERALL) 20 MG tablet Take 1 tablet (20 mg total) by mouth 2 (two) times daily. 60 tablet 0   amphetamine-dextroamphetamine (ADDERALL) 20 MG tablet Take 1 tablet (20 mg total) by mouth 2 (two) times daily. 60 tablet 0   amphetamine-dextroamphetamine (ADDERALL) 20 MG tablet Take 1 tablet (20 mg total) by mouth 2 (two) times daily. 60 tablet 0   aspirin EC 81 MG tablet Take 81 mg by mouth daily. Swallow whole.     atenolol (TENORMIN) 100 MG tablet Take 100 mg by mouth daily.     azaTHIOprine (IMURAN) 50 MG tablet Take 1 tablet (50 mg total) by mouth daily. 90 tablet 1   azithromycin (ZITHROMAX Z-PAK) 250 MG tablet 2 po day one, then 1 daily x 4 days 6 tablet 0   bimatoprost (LUMIGAN) 0.01 % SOLN Place 1 drop into both eyes at bedtime.     Calcium Citrate-Vitamin D3 (CALCIUM CITRATE +D) 315-6.25 MG-MCG TABS Take 1 tablet by mouth 2 (two) times daily.     cholecalciferol (VITAMIN D3) 25 MCG (1000 UNIT) tablet Take 2,000 Units by mouth daily.     clindamycin (CLINDAGEL) 1 % gel Apply topically 2 (two) times daily.     cycloSPORINE (RESTASIS) 0.05 % ophthalmic emulsion Place 1 drop into both eyes 2 (two) times daily.     dicyclomine (BENTYL) 10 MG capsule Take 1 capsule (10 mg total)  by mouth 2 (two) times daily as needed. 60 capsule 2   Dulaglutide (TRULICITY) A999333 0000000 SOPN Inject 0.75 mg into the skin once a week. 2 mL 3   DULoxetine (CYMBALTA) 60 MG capsule TAKE 1 CAPSULE(60 MG) BY MOUTH TWICE DAILY 60 capsule 2   esomeprazole (NEXIUM) 40 MG capsule TAKE 1 CAPSULE(40 MG) BY MOUTH EVERY MORNING 90 capsule 1   FARXIGA 10 MG TABS tablet Take 10 mg by mouth daily.     fexofenadine (ALLEGRA) 180 MG tablet Take 180 mg by mouth daily.      gabapentin (NEURONTIN) 100 MG capsule Take 100 mg by mouth 3 (three) times daily.     HYDROcodone-acetaminophen (NORCO/VICODIN) 5-325 MG tablet Take 1 tablet by mouth 2 (two) times daily as needed for moderate pain. 0.5 -1 po bid prn     hydroxychloroquine (PLAQUENIL) 200 MG tablet Take 1 tablet (200 mg total) by mouth 2 (two) times daily. 60 tablet 2   lisinopril (ZESTRIL) 40 MG tablet TAKE 1 TABLET(40 MG) BY MOUTH DAILY (Patient taking differently: Take 40 mg by mouth daily.) 90 tablet 0   metFORMIN (GLUCOPHAGE) 1000 MG tablet Take 1 tablet (1,000 mg total) by mouth 2 (two) times daily with a meal. 60 tablet 3   methocarbamol (ROBAXIN) 500 MG tablet Take 500 mg by mouth 2 (two) times daily as needed for muscle spasms.     metoCLOPramide (REGLAN) 5 MG tablet Take 1 tablet (5 mg total) by mouth 3 (three) times daily before meals. 210 tablet 1   mirabegron ER (MYRBETRIQ) 50 MG TB24 tablet Take 1 tablet (50 mg total) by mouth daily. (Patient not taking: Reported on 04/18/2022) 30 tablet 11   Multiple Vitamins-Minerals (CENTRUM) tablet Take 1 tablet by mouth daily.     nitroGLYCERIN (NITROLINGUAL) 0.4 MG/SPRAY spray Place 1 spray under the tongue every 5 (five) minutes x 3 doses as needed for chest pain.     NP THYROID 60 MG tablet TAKE 1 TABLET(60 MG) BY MOUTH DAILY BEFORE BREAKFAST (Patient taking differently: Take 60 mg by mouth daily before breakfast.) 30 tablet 3   Polyvinyl Alcohol-Povidone (REFRESH OP) Place 1 drop into both eyes daily as needed (dry eyes).     prazosin (MINIPRESS) 2 MG capsule TAKE 1 CAPSULE(2 MG) BY MOUTH AT BEDTIME 30 capsule 2   PRESCRIPTION MEDICATION Take 10 mg by mouth 3 (three) times daily before meals. This is MOTILLIUM ( DOMPERIDONE 10 mg) Tablet     PREVIDENT 5000 PLUS 1.1 % CREA dental cream Place 1 Application onto teeth every morning.     Probiotic Product (PROBIOTIC PO) Take 1 capsule by mouth daily.     propafenone (RYTHMOL SR) 225 MG 12 hr capsule Take 225 mg by  mouth every 12 (twelve) hours.     REPATHA 140 MG/ML SOSY Inject 1 mL into the skin every 14 (fourteen) days.     rosuvastatin (CRESTOR) 40 MG tablet Take 40 mg by mouth daily.     spironolactone (ALDACTONE) 25 MG tablet Take 25 mg by mouth daily.     No current facility-administered medications for this visit.     Musculoskeletal: Strength & Muscle Tone: within normal limits Gait & Station: normal Patient leans: N/A  Psychiatric Specialty Exam: Review of Systems  All other systems reviewed and are negative.   There were no vitals taken for this visit.There is no height or weight on file to calculate BMI.  General Appearance: Casual and Fairly Groomed  Eye Contact:  Good  Speech:  Clear and Coherent  Volume:  Normal  Mood:  Euthymic  Affect:  Appropriate and Congruent  Thought Process:  Goal Directed  Orientation:  Full (Time, Place, and Person)  Thought Content: WDL   Suicidal Thoughts:  No  Homicidal Thoughts:  No  Memory:  Immediate;   Good Recent;   Good Remote;   Good  Judgement:  Good  Insight:  Fair  Psychomotor Activity:  Normal  Concentration:  Concentration: Good and Attention Span: Good  Recall:  Good  Fund of Knowledge: Good  Language: Good  Akathisia:  No  Handed:  Right  AIMS (if indicated): not done  Assets:  Communication Skills Desire for Improvement Physical Health Resilience Social Support Talents/Skills  ADL's:  Intact  Cognition: WNL  Sleep:  Good   Screenings: PHQ2-9    Flowsheet Row Video Visit from 02/08/2022 in [place] at [place] Video Visit from 11/09/2021 in [place] at [place] Video Visit from 08/12/2021 in [place] at [place] Video Visit from 04/15/2021 in [place] at [place] from 04/04/2021 in [place] at [facility] Total Score 2 0 1 1 6   $ PHQ-9 Total Score 7 -- -- 2 [place] ED from 07/30/2022 in [facility] Emergency Department at [facility] ED from 05/23/2022 in [facility] Emergency Department at [facility] Admission (Discharged) from 04/21/2022 in [place] CATH LAB  C-SSRS RISK CATEGORY No Risk No Risk No Risk        Assessment and Plan: This patient is a 52 year old female with a history of PTSD nightmares depression anxiety and ADHD.  She is doing much better since she started her exercise rest regimen.  She will continue Cymbalta 60 mg twice daily for depression, prazosin 2 mg at bedtime for nightmares and sleep and Adderall 20 mg twice daily for ADHD.  She will return to see me in 3 months  Collaboration of Care: Collaboration of Care: Primary Care Provider AEB notes will be shared with PCP at patient's request  Patient/Guardian was advised Release of Information must be obtained prior to any record release in order to collaborate their care with an outside provider. Patient/Guardian was advised if they have not already done so to contact the registration department to sign all necessary forms in order for [place] to release information regarding their care.   Consent: Patient/Guardian gives verbal consent for treatment and assignment of benefits for services provided during this visit. Patient/Guardian expressed understanding and agreed to proceed.    [name], MD 08/18/2022, 11:16 AM

## 2022-08-27 NOTE — Progress Notes (Deleted)
Office Visit Note  Patient: Lindsay Walsh             Date of Birth: Nov 30, 1970           MRN: [id number]             PCP: Dulce Sellar, MD Referring: Jani Gravel, MD Visit Date: 08/28/2022   Subjective:  No chief complaint on file.   History of Present Illness: Lindsay Walsh is a 52 y.o. female here for follow up ***   Previous HPI 03/20/22 Lindsay Walsh is a 51 y.o. female here for follow up for suspected seronegative arthritis with positive RNP Abs and autoimmune hepatitis. She started taking hydroxychloroquine 200 mg daily for this and feels her symptoms are overall somewhat improved. She still has numbness and joint pains in bilateral hands. Not seeing significant swelling and no rashes. Labs reviewed from 8/21 at [facility] including CBC and LFTs were normal.   Previous HPI 11/16/2021 Lindsay Walsh is a 52 y.o. female here for follow up for abnormal lab findings and continued ongoing joint pain in multiple areas.  After our last visit did not recommend any specific new anti-inflammatory medication.  She feels overall symptoms are doing worse compared to a year ago.  She has pain pretty much all over.  Some days she feels a sensitivity and pain other times feels like she has more numbness or diminished sensation throughout.  She is getting swelling in her hands and feet that is worse by the end of the day.  She keeps persistent fatigue usually regardless of her sleep quality.  Mixed constipation and diarrhea symptoms.  Repeat laboratory testing by her pain management provider concerning for worsening lab markers for connective tissue disease or inflammation.  Liver function test been remaining stable on Imuran.   Previous HPI 06/16/2020 Lindsay Walsh is a 52 y.o. female here for follow up with hand pain and sometimes all over pain, positive RNP antibodies here for repeat assessment for inflammatory joint pain, nailfold capillaroscopy, and positive RNP test. She continues to  have pain in multiple places including her hands, knees, also neck and upper back. No signficant changes since her initial visit.   No Rheumatology ROS completed.   PMFS History:  Patient Active Problem List   Diagnosis Date Noted   Progressive angina [facility])    Coronary artery calcification seen on CT scan    History of nuclear stress test    Abnormal nuclear stress test 04/11/2022   Atypical angina 04/11/2022   Bilateral carpal tunnel syndrome 04/05/2022   Positive ANA (antinuclear antibody) 11/16/2021   History of colonic polyps 11/15/2021   Bilateral hand pain 06/02/2020   OAB (overactive bladder) 11/05/2019   Recurrent UTI 11/05/2019   Diabetic gastroparesis ([place]) 08/21/2019   Nausea and vomiting 07/15/2019   Diarrhea 07/15/2019   Abnormal finding on urinalysis 07/15/2019   Morbid (severe) obesity due to excess calories (HCC)    Major depressive disorder, single episode, unspecified    Restless legs syndrome    Sleep apnea    Fibromyalgia    Hereditary and idiopathic neuropathy, unspecified    Nonspecific mesenteric lymphadenitis    Pain in right foot    Type 2 diabetes mellitus with hyperglycemia ([place])    Hypothyroidism 08/22/2018   DM type 2 causing vascular disease ([place]) 08/21/2018   Hyperlipidemia associated with type 2 diabetes mellitus ([place]) 08/21/2018   Essential hypertension, benign 08/21/2018   Influenza A 08/10/2018  SIRS (systemic inflammatory response syndrome) (HCC) 08/10/2018   Cerebral infarction ([place]) 03/05/2015   Dyspareunia 05/06/2014   Obesity 09/30/2013   Bilateral hip pain 07/16/2013   Chronic pain syndrome 07/16/2013   Degenerative disc disease, lumbar 07/16/2013   Greater trochanteric bursitis of both hips 07/16/2013   Nightmares associated with chronic post-traumatic stress disorder 10/18/2012   Sprain of foot 03/14/2012   Weakness 01/29/2012   IBS (irritable bowel syndrome) 08/01/2011   Gastroesophageal reflux disease    Depression     Chest pain    Fasting hyperglycemia    TOBACCO ABUSE 04/28/2010   NARCOTIC ABUSE 04/28/2010   PULMONARY NODULE 04/28/2010   Autoimmune hepatitis ([place]) 04/28/2010   Myalgia and myositis 01/06/2009    Past Medical History:  Diagnosis Date   Acid reflux    Allergic rhinitis due to pollen    Anxiety    Arthritis    back   Autoimmune hepatitis ([place])    Back pain    Connective tissue disease ([place])    Depression    Dizziness and giddiness    Dyspareunia 05/06/2014   Dysrhythmia    palpatations; on propafenone   Essential (primary) hypertension    Fibromyalgia    Gastroesophageal reflux disease    Chronic abdominal pain; gastroparesis; globus hystericus; irritable bowel syndrome   Glaucoma    Hereditary and idiopathic neuropathy, unspecified    Hyperlipidemia    Irritable bowel syndrome    Major depressive disorder, single episode, unspecified    Morbid (severe) obesity due to excess calories (HCC)    Narcotic dependence ([place])    Nonspecific mesenteric lymphadenitis    Overweight(278.02)    Pain in limb    Pain in right foot    PTSD (post-traumatic stress disorder)    Pulmonary nodule    Restless legs syndrome    Sleep apnea    on CPAP machine   Stroke ([place]) 2016   Systemic lupus erythematosus (SLE) in adult [facility])    Tobacco abuse    one pack per day; 35 pack years; quit in February 2023   Type 2 diabetes mellitus with hyperglycemia ([place])     Family History  Problem Relation Age of Onset   Diabetes Mother    Hypertension Mother    Anxiety disorder Mother    Depression Mother    Drug abuse Mother    Heart disease Father    Diabetes Father    Anxiety disorder Father    Alcohol abuse Father    Depression Father    OCD Father    Thyroid disease Sister    Healthy Brother    Healthy Daughter    Healthy Son    ADD / ADHD Son    Alcohol abuse Paternal Grandfather    Depression Paternal Grandfather    Cancer Paternal Grandfather        lung,skin   Tuberculosis  Paternal Grandfather    Seizures Paternal Grandmother    Lupus Paternal Grandmother    ADD / ADHD Son    Anesthesia problems Neg Hx    Malignant hyperthermia Neg Hx    Pseudochol deficiency Neg Hx    Hypotension Neg Hx    Bipolar disorder Neg Hx    Dementia Neg Hx    Paranoid behavior Neg Hx    Schizophrenia Neg Hx    Sexual abuse Neg Hx    Physical abuse Neg Hx    Past Surgical History:  Procedure Laterality Date   ABDOMINAL HYSTERECTOMY  TAH&BSO   BACK SURGERY     BLADDER SUSPENSION  09/12/2011   Procedure: TRANSVAGINAL TAPE (TVT) PROCEDURE;  Surgeon: [name], MD;  Location: [facility];  Service: Urology;  Laterality: N/A;   CESAREAN SECTION     X3   CHOLECYSTECTOMY     ESOPHAGOGASTRODUODENOSCOPY ENDOSCOPY     "throat stretched" per pt.   INTRAVASCULAR PRESSURE WIRE/FFR STUDY N/A 04/21/2022   Procedure: INTRAVASCULAR PRESSURE WIRE/FFR STUDY;  Surgeon: Leonie Man, MD;  Location: [place] [facility];  Service: Cardiovascular;  Laterality: N/A;   LEFT HEART CATH AND CORONARY ANGIOGRAPHY N/A 04/21/2022   Procedure: LEFT HEART CATH AND CORONARY ANGIOGRAPHY;  Surgeon: Leonie Man, MD;  Location: [place] [facility];  Service: Cardiovascular;  Laterality: N/A;   LIVER BIOPSY     x4   LUMBAR EPIDURAL INJECTION  01/2012   TOTAL ABDOMINAL HYSTERECTOMY W/ BILATERAL SALPINGOOPHORECTOMY     TUBAL LIGATION     Bilateral   UPPER GASTROINTESTINAL ENDOSCOPY  09/13/2010   Social History   Social History Narrative   Divorced since 1994.Lives with boyfriend of 11 years.On disability.   Immunization History  Administered Date(s) Administered   Influenza Inj Mdck Quad Pf 04/20/2016   Influenza Split 04/07/2013, 05/17/2017   Influenza,inj,Quad PF,6+ Mos 05/17/2017, 04/04/2018, 03/31/2019, 04/28/2019   Influenza-Unspecified 04/17/2012, 03/20/2014, 08/09/2014, 05/06/2015   Moderna Sars-Covid-2 Vaccination 02/06/2020, 03/05/2020   Pneumococcal Conjugate-13 12/18/2019    Pneumococcal Polysaccharide-23 04/20/2016     Objective: Vital Signs: There were no vitals taken for this visit.   Physical Exam   Musculoskeletal Exam: ***  CDAI Exam: CDAI Score: -- Patient Global: --; Provider Global: -- Swollen: --; Tender: -- Joint Exam 08/28/2022   No joint exam has been documented for this visit   There is currently no information documented on the homunculus. Go to the Rheumatology activity and complete the homunculus joint exam.  Investigation: No additional findings.  Imaging: DG Chest 2 View  Result Date: 07/30/2022 CLINICAL DATA:  Follow-up left upper lobe nodule. EXAM: CHEST - 2 VIEW COMPARISON:  08/10/2018 FINDINGS: Heart size and mediastinal contours appear normal. No pleural effusion or edema. No airspace opacities. The apparent nodular density noted in the left upper lobe in the projection of the anterior aspect of the left third rib corresponds to calcifications within the costochondral cartilage in appears similar to 08/10/2018. IMPRESSION: 1. No active cardiopulmonary disease. 2. Left upper lobe nodular density corresponds to calcifications within the costochondral cartilage. Electronically Signed   By: Kerby Moors M.D.   On: 07/30/2022 12:20   DG Chest Port 1 View  Result Date: 07/30/2022 CLINICAL DATA:  Shortness of breath. EXAM: PORTABLE CHEST 1 VIEW COMPARISON:  08/10/2018 FINDINGS: Heart size appears normal. There is no pleural effusion or edema identified. No airspace opacities identified. Nodular density projecting over the left upper lobe is indeterminate measuring 1 cm. Not confidently identified on previous imaging. This may represent calcification of the third left rib costal cartilage. Visualized osseous structures are unremarkable. IMPRESSION: 1. No acute cardiopulmonary abnormalities. 2. Indeterminate 1 cm nodular density projecting over the left upper lobe. This may represent calcification of the third left rib costal cartilage.  Recommend upright PA and lateral chest radiograph. Electronically Signed   By: Kerby Moors M.D.   On: 07/30/2022 07:51    Recent Labs: Lab Results  Component Value Date   WBC 7.8 07/30/2022   HGB 12.4 07/30/2022   PLT 238 07/30/2022   NA 138 07/30/2022  K 4.5 07/30/2022   CL 104 07/30/2022   CO2 27 07/30/2022   GLUCOSE 129 (H) 07/30/2022   BUN 11 07/30/2022   CREATININE 0.76 07/30/2022   BILITOT 0.4 07/30/2022   ALKPHOS 78 07/30/2022   AST 24 07/30/2022   ALT 19 07/30/2022   PROT 6.9 07/30/2022   ALBUMIN 3.7 07/30/2022   CALCIUM 8.4 (L) 07/30/2022   GFRAA 101 08/03/2020    Speciality Comments: Left message for patient to call us for PLQ eye exam.  Procedures:  No procedures performed Allergies: Doxycycline, Fentanyl, Cefoxitin, Ceftin [cefuroxime axetil], Iodinated contrast media, Iohexol, Norvasc [amlodipine besylate], Trintellix [vortioxetine], Wellbutrin [bupropion hcl], Latex, and Tape   Assessment / Plan:     Visit Diagnoses: No diagnosis found.  ***  Orders: No orders of the defined types were placed in this encounter.  No orders of the defined types were placed in this encounter.    Follow-Up Instructions: No follow-ups on file.   Collier Salina, MD  Note - This record has been created using [employer].  Chart creation errors have been sought, but may not always  have been located. Such creation errors do not reflect on  the standard of medical care.

## 2022-08-28 ENCOUNTER — Ambulatory Visit: Payer: Medicare Other | Admitting: Internal Medicine

## 2022-09-04 ENCOUNTER — Encounter (HOSPITAL_COMMUNITY): Payer: Self-pay

## 2022-09-04 ENCOUNTER — Ambulatory Visit (INDEPENDENT_AMBULATORY_CARE_PROVIDER_SITE_OTHER): Payer: 59 | Admitting: Clinical

## 2022-09-04 DIAGNOSIS — F419 Anxiety disorder, unspecified: Secondary | ICD-10-CM | POA: Diagnosis not present

## 2022-09-04 DIAGNOSIS — F4310 Post-traumatic stress disorder, unspecified: Secondary | ICD-10-CM

## 2022-09-04 DIAGNOSIS — F331 Major depressive disorder, recurrent, moderate: Secondary | ICD-10-CM

## 2022-09-04 DIAGNOSIS — F900 Attention-deficit hyperactivity disorder, predominantly inattentive type: Secondary | ICD-10-CM | POA: Diagnosis not present
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
## 2022-09-20 ENCOUNTER — Other Ambulatory Visit: Payer: Self-pay | Admitting: Internal Medicine

## 2022-09-20 ENCOUNTER — Encounter: Payer: Self-pay | Admitting: *Deleted

## 2022-09-20 DIAGNOSIS — M79641 Pain in right hand: Secondary | ICD-10-CM

## 2022-09-20 DIAGNOSIS — R768 Other specified abnormal immunological findings in serum: Secondary | ICD-10-CM

## 2022-09-20 NOTE — Telephone Encounter (Signed)
Next Visit: 09/25/2022  Last Visit: 03/20/2022  Labs: 07/30/2022  Glucose 129, Calcium 8.4,   Eye exam: not on file Noted patient's appointment for PLQ eye exam. Also sent my chart message to patient.   Current Dose per office note 03/20/2022: hydroxychloroquine increase dose to 400 mg daily for weight based dose   DX: Positive ANA (antinuclear antibody)    Last Fill: 03/20/2022  Okay to refill Plaquenil?

## 2022-09-24 NOTE — Progress Notes (Signed)
Office Visit Note  Patient: Lindsay Walsh             Date of Birth: 07-22-1970           MRN: [id number]             PCP: Dulce Sellar, MD Referring: Jani Gravel, MD Visit Date: 09/25/2022   Subjective:  No chief complaint on file.   History of Present Illness: Lindsay Walsh is a 52 y.o. female here for follow up ***   Previous HPI 03/20/22 Lindsay Walsh is a 52 y.o. female here for follow up for suspected seronegative arthritis with positive RNP Abs and autoimmune hepatitis. She started taking hydroxychloroquine 200 mg daily for this and feels her symptoms are overall somewhat improved. She still has numbness and joint pains in bilateral hands. Not seeing significant swelling and no rashes. Labs reviewed from 8/21 at [facility] including CBC and LFTs were normal.   Previous HPI 11/16/2021 Lindsay Walsh is a 52 y.o. female here for follow up for abnormal lab findings and continued ongoing joint pain in multiple areas.  After our last visit did not recommend any specific new anti-inflammatory medication.  She feels overall symptoms are doing worse compared to a year ago.  She has pain pretty much all over.  Some days she feels a sensitivity and pain other times feels like she has more numbness or diminished sensation throughout.  She is getting swelling in her hands and feet that is worse by the end of the day.  She keeps persistent fatigue usually regardless of her sleep quality.  Mixed constipation and diarrhea symptoms.  Repeat laboratory testing by her pain management provider concerning for worsening lab markers for connective tissue disease or inflammation.  Liver function test been remaining stable on Imuran.   Previous HPI 06/16/2020 Lindsay Walsh is a 52 y.o. female here for follow up with hand pain and sometimes all over pain, positive RNP antibodies here for repeat assessment for inflammatory joint pain, nailfold capillaroscopy, and positive RNP test. She continues to  have pain in multiple places including her hands, knees, also neck and upper back. No signficant changes since her initial visit.   No Rheumatology ROS completed.   PMFS History:  Patient Active Problem List   Diagnosis Date Noted   Progressive angina [facility])    Coronary artery calcification seen on CT scan    History of nuclear stress test    Abnormal nuclear stress test 04/11/2022   Atypical angina 04/11/2022   Bilateral carpal tunnel syndrome 04/05/2022   Positive ANA (antinuclear antibody) 11/16/2021   History of colonic polyps 11/15/2021   Bilateral hand pain 06/02/2020   OAB (overactive bladder) 11/05/2019   Recurrent UTI 11/05/2019   Diabetic gastroparesis ([place]) 08/21/2019   Nausea and vomiting 07/15/2019   Diarrhea 07/15/2019   Abnormal finding on urinalysis 07/15/2019   Morbid (severe) obesity due to excess calories (HCC)    Major depressive disorder, single episode, unspecified    Restless legs syndrome    Sleep apnea    Fibromyalgia    Hereditary and idiopathic neuropathy, unspecified    Nonspecific mesenteric lymphadenitis    Pain in right foot    Type 2 diabetes mellitus with hyperglycemia ([place])    Hypothyroidism 08/22/2018   DM type 2 causing vascular disease ([place]) 08/21/2018   Hyperlipidemia associated with type 2 diabetes mellitus ([place]) 08/21/2018   Essential hypertension, benign 08/21/2018   Influenza A 08/10/2018  SIRS (systemic inflammatory response syndrome) (HCC) 08/10/2018   Cerebral infarction ([place]) 03/05/2015   Dyspareunia 05/06/2014   Obesity 09/30/2013   Bilateral hip pain 07/16/2013   Chronic pain syndrome 07/16/2013   Degenerative disc disease, lumbar 07/16/2013   Greater trochanteric bursitis of both hips 07/16/2013   Nightmares associated with chronic post-traumatic stress disorder 10/18/2012   Sprain of foot 03/14/2012   Weakness 01/29/2012   IBS (irritable bowel syndrome) 08/01/2011   Gastroesophageal reflux disease    Depression     Chest pain    Fasting hyperglycemia    TOBACCO ABUSE 04/28/2010   NARCOTIC ABUSE 04/28/2010   PULMONARY NODULE 04/28/2010   Autoimmune hepatitis ([place]) 04/28/2010   Myalgia and myositis 01/06/2009    Past Medical History:  Diagnosis Date   Acid reflux    Allergic rhinitis due to pollen    Anxiety    Arthritis    back   Autoimmune hepatitis ([place])    Back pain    Connective tissue disease ([place])    Depression    Dizziness and giddiness    Dyspareunia 05/06/2014   Dysrhythmia    palpatations; on propafenone   Essential (primary) hypertension    Fibromyalgia    Gastroesophageal reflux disease    Chronic abdominal pain; gastroparesis; globus hystericus; irritable bowel syndrome   Glaucoma    Hereditary and idiopathic neuropathy, unspecified    Hyperlipidemia    Irritable bowel syndrome    Major depressive disorder, single episode, unspecified    Morbid (severe) obesity due to excess calories (HCC)    Narcotic dependence ([place])    Nonspecific mesenteric lymphadenitis    Overweight(278.02)    Pain in limb    Pain in right foot    PTSD (post-traumatic stress disorder)    Pulmonary nodule    Restless legs syndrome    Sleep apnea    on CPAP machine   Stroke ([place]) 2016   Systemic lupus erythematosus (SLE) in adult [facility])    Tobacco abuse    one pack per day; 35 pack years; quit in February 2023   Type 2 diabetes mellitus with hyperglycemia ([place])     Family History  Problem Relation Age of Onset   Diabetes Mother    Hypertension Mother    Anxiety disorder Mother    Depression Mother    Drug abuse Mother    Heart disease Father    Diabetes Father    Anxiety disorder Father    Alcohol abuse Father    Depression Father    OCD Father    Thyroid disease Sister    Healthy Brother    Healthy Daughter    Healthy Son    ADD / ADHD Son    Alcohol abuse Paternal Grandfather    Depression Paternal Grandfather    Cancer Paternal Grandfather        lung,skin   Tuberculosis  Paternal Grandfather    Seizures Paternal Grandmother    Lupus Paternal Grandmother    ADD / ADHD Son    Anesthesia problems Neg Hx    Malignant hyperthermia Neg Hx    Pseudochol deficiency Neg Hx    Hypotension Neg Hx    Bipolar disorder Neg Hx    Dementia Neg Hx    Paranoid behavior Neg Hx    Schizophrenia Neg Hx    Sexual abuse Neg Hx    Physical abuse Neg Hx    Past Surgical History:  Procedure Laterality Date   ABDOMINAL HYSTERECTOMY  TAH&BSO   BACK SURGERY     BLADDER SUSPENSION  09/12/2011   Procedure: TRANSVAGINAL TAPE (TVT) PROCEDURE;  Surgeon: [name], MD;  Location: [facility];  Service: Urology;  Laterality: N/A;   CESAREAN SECTION     X3   CHOLECYSTECTOMY     ESOPHAGOGASTRODUODENOSCOPY ENDOSCOPY     "throat stretched" per pt.   INTRAVASCULAR PRESSURE WIRE/FFR STUDY N/A 04/21/2022   Procedure: INTRAVASCULAR PRESSURE WIRE/FFR STUDY;  Surgeon: Leonie Man, MD;  Location: [facility];  Service: Cardiovascular;  Laterality: N/A;   LEFT HEART CATH AND CORONARY ANGIOGRAPHY N/A 04/21/2022   Procedure: LEFT HEART CATH AND CORONARY ANGIOGRAPHY;  Surgeon: Leonie Man, MD;  Location: [facility];  Service: Cardiovascular;  Laterality: N/A;   LIVER BIOPSY     x4   LUMBAR EPIDURAL INJECTION  01/2012   TOTAL ABDOMINAL HYSTERECTOMY W/ BILATERAL SALPINGOOPHORECTOMY     TUBAL LIGATION     Bilateral   UPPER GASTROINTESTINAL ENDOSCOPY  09/13/2010   Social History   Social History Narrative   Divorced since 1994.Lives with boyfriend of 11 years.On disability.   Immunization History  Administered Date(s) Administered   Influenza Inj Mdck Quad Pf 04/20/2016   Influenza Split 04/07/2013, 05/17/2017   Influenza,inj,Quad PF,6+ Mos 05/17/2017, 04/04/2018, 03/31/2019, 04/28/2019   Influenza-Unspecified 04/17/2012, 03/20/2014, 08/09/2014, 05/06/2015   Moderna Sars-Covid-2 Vaccination 02/06/2020, 03/05/2020   Pneumococcal Conjugate-13 12/18/2019    Pneumococcal Polysaccharide-23 04/20/2016     Objective: Vital Signs: There were no vitals taken for this visit.   Physical Exam   Musculoskeletal Exam: ***  CDAI Exam: CDAI Score: -- Patient Global: --; Provider Global: -- Swollen: --; Tender: -- Joint Exam 09/25/2022   No joint exam has been documented for this visit   There is currently no information documented on the homunculus. Go to the Rheumatology activity and complete the homunculus joint exam.  Investigation: No additional findings.  Imaging: No results found.  Recent Labs: Lab Results  Component Value Date   WBC 7.8 07/30/2022   HGB 12.4 07/30/2022   PLT 238 07/30/2022   NA 138 07/30/2022   K 4.5 07/30/2022   CL 104 07/30/2022   CO2 27 07/30/2022   GLUCOSE 129 (H) 07/30/2022   BUN 11 07/30/2022   CREATININE 0.76 07/30/2022   BILITOT 0.4 07/30/2022   ALKPHOS 78 07/30/2022   AST 24 07/30/2022   ALT 19 07/30/2022   PROT 6.9 07/30/2022   ALBUMIN 3.7 07/30/2022   CALCIUM 8.4 (L) 07/30/2022   GFRAA 101 08/03/2020    Speciality Comments: Left message for patient to call us for PLQ eye exam.  Procedures:  No procedures performed Allergies: Doxycycline, Fentanyl, Cefoxitin, Ceftin [cefuroxime axetil], Iodinated contrast media, Iohexol, Norvasc [amlodipine besylate], Trintellix [vortioxetine], Wellbutrin [bupropion hcl], Latex, and Tape   Assessment / Plan:     Visit Diagnoses: No diagnosis found.  ***  Orders: No orders of the defined types were placed in this encounter.  No orders of the defined types were placed in this encounter.    Follow-Up Instructions: No follow-ups on file.   Collier Salina, MD  Note - This record has been created using [employer].  Chart creation errors have been sought, but may not always  have been located. Such creation errors do not reflect on  the standard of medical care.

## 2022-09-25 ENCOUNTER — Ambulatory Visit: Payer: 59 | Attending: Internal Medicine | Admitting: Internal Medicine

## 2022-09-25 ENCOUNTER — Encounter: Payer: Self-pay | Admitting: Internal Medicine

## 2022-09-25 VITALS — BP 114/81 | HR 114 | Resp 16 | Ht 60.0 in | Wt 190.6 lb

## 2022-09-25 DIAGNOSIS — G5603 Carpal tunnel syndrome, bilateral upper limbs: Secondary | ICD-10-CM | POA: Diagnosis not present

## 2022-09-25 DIAGNOSIS — I880 Nonspecific mesenteric lymphadenitis: Secondary | ICD-10-CM

## 2022-09-25 DIAGNOSIS — R768 Other specified abnormal immunological findings in serum: Secondary | ICD-10-CM | POA: Diagnosis not present

## 2022-09-25 DIAGNOSIS — M25512 Pain in left shoulder: Secondary | ICD-10-CM

## 2022-09-25 DIAGNOSIS — G8929 Other chronic pain: Secondary | ICD-10-CM

## 2022-09-25 DIAGNOSIS — K754 Autoimmune hepatitis: Secondary | ICD-10-CM

## 2022-09-26 LAB — C-REACTIVE PROTEIN: CRP: 1.4 mg/L (ref <8.0)

## 2022-09-26 LAB — RNP ANTIBODY: Ribonucleic Protein(ENA) Antibody, IgG: 1.9 AI — AB

## 2022-09-26 LAB — SEDIMENTATION RATE: Sed Rate: 17 mm/h (ref 0–30)

## 2022-09-26 MED ORDER — TRIAMCINOLONE ACETONIDE 40 MG/ML IJ SUSP
40.0000 mg | INTRAMUSCULAR | Status: AC | PRN
  Administered 2022-09-25: 40 mg via INTRA_ARTICULAR

## 2022-09-26 MED ORDER — LIDOCAINE HCL 1 % IJ SOLN
3.0000 mL | INTRAMUSCULAR | Status: AC | PRN
  Administered 2022-09-25: 3 mL

## 2022-09-27 ENCOUNTER — Telehealth: Payer: Self-pay | Admitting: *Deleted

## 2022-09-27 NOTE — Progress Notes (Signed)
Sed rate and CRP are normal now. Shoulder pain probably related to the tendonitis or bursitis issue and not an inflammatory problem. RNP antibody titer is 1.9 still positive but lower than before. I recommend continuing the hydroxychloroquine no additional medication recommended at this time.

## 2022-09-27 NOTE — Telephone Encounter (Signed)
Patient was asking about a referral to a spine specialist. Please advise.

## 2022-09-28 ENCOUNTER — Encounter (INDEPENDENT_AMBULATORY_CARE_PROVIDER_SITE_OTHER): Payer: Self-pay | Admitting: Gastroenterology

## 2022-10-10 ENCOUNTER — Ambulatory Visit (INDEPENDENT_AMBULATORY_CARE_PROVIDER_SITE_OTHER): Payer: 59 | Admitting: Clinical

## 2022-10-10 DIAGNOSIS — F900 Attention-deficit hyperactivity disorder, predominantly inattentive type: Secondary | ICD-10-CM | POA: Diagnosis not present

## 2022-10-10 DIAGNOSIS — F4310 Post-traumatic stress disorder, unspecified: Secondary | ICD-10-CM

## 2022-10-10 DIAGNOSIS — F32A Depression, unspecified: Secondary | ICD-10-CM | POA: Diagnosis not present

## 2022-10-10 NOTE — Progress Notes (Signed)
Virtual Visit via Video Note  I connected with Lindsay Walsh on 10/10/22 at  4:00 PM EDT by a video enabled telemedicine application and verified that I am speaking with the correct person using two identifiers.  Location: Patient: Home Provider: Office   I discussed the limitations of evaluation and management by telemedicine and the availability of in person appointments. The patient expressed understanding and agreed to proceed.  THERAPIST PROGRESS NOTE   Session Time: 4:00 PM-4:30 PM   Participation Level: Active   Behavioral Response: CasualAlertDepressed   Type of Therapy: Individual Therapy   Treatment Goals addressed: Coping   Interventions: CBT   Summary: Lindsay Walsh. Lindsay Walsh is a 52 y.o. female who presents with Depression, ADHD, PTSD  . The OPT therapist worked with the patient for Lindsay ongoing OPT treatment session. The OPT therapist utilized Motivational Interviewing to assist in creating therapeutic repore. The patient in the session was engaged and work in collaboration giving feedback about Lindsay triggers and symptoms over the past few weeks. The patient spoke about working to implement coping taking walks, going on [employer], socializing, and community outings. The patient spoke about Lindsay work since she has found she has hear disease, to stay away from stressors. The patient noted" Lindsay Walsh got into it I took time to calm down and I told them I do not need the stress I am not going to have it".The OPT therapist utilized Cognitive Behavioral Therapy through cognitive restructuring as well as worked with the patient on coping strategies to assist in management of mood. The patient identified interactions with certain Walsh members as triggers and Lindsay realization that she had to work on limiting Lindsay interaction and Lindsay reactive behavior using coping when needed such as walking away or getting off the phone to help Lindsay not get upset. The patient spoke about Lindsay intent to  be more self focused on Lindsay health and happiness moving forward. The patient spoke about Lindsay recent Easter  holiday and noted there was no problems over the weekend while she was around Lindsay Walsh, but actively put Lindsay focus into spending time with Lindsay grandchildren.    Suicidal/Homicidal: Nowithout intent/plan   Therapist Response: The OPT therapist worked with the patient for the patients scheduled session. The patient was engaged in Lindsay session and gave feedback in relation to triggers, symptoms, and behavior responses over the past few weeks. The OPT therapist worked with the patient utilizing an in session Cognitive Behavioral Therapy exercise. The patient was responsive in the session and verbalized," I realized with a lot of my Walsh members the people I should be close to I am not and a lot of it stems from my childhood". The patient spoke about feeling like she is the black sheep of the Walsh, and feeling she is always there to help Lindsay Walsh when Lindsay Walsh aren't . The patient spoke about taking on the stress of Lindsay Walsh interactions and feeling others don't care about Lindsay feelings and that Lindsay Walsh and their kids are treated better. The patient spoke about Lindsay realization she can't continue to take ownership of being mistreated by Lindsay Walsh and Lindsay focus on being stronger in setting boundaries with Lindsay Walsh. The OPT therapist worked with the patient on placing Lindsay energy/focus/and attention in areas of Lindsay life she has control over and doing Lindsay self check ins throughout the week. The OPT therapist will continue treatment work with the patient in Lindsay next scheduled  session   Plan: Return again in 3 weeks.   Diagnosis       Axis I: Depressive Disorder unspecified, ADHD inattentive type, PTSD.                             Axis II: No diagnosis     Collaboration of Care: No additional collaboration of care for this session.   Patient/Guardian was advised Release of Information  must be obtained prior to any record release in order to collaborate their care with an outside provider. Patient/Guardian was advised if they have not already done so to contact the registration department to sign all necessary forms in order for [place] to release information regarding their care.    Consent: Patient/Guardian gives verbal consent for treatment and assignment of benefits for services provided during this visit. Patient/Guardian expressed understanding and agreed to proceed.    I discussed the assessment and treatment plan with the patient. The patient was provided an opportunity to ask questions and all were answered. The patient agreed with the plan and demonstrated an understanding of the instructions.   The patient was advised to call back or seek an in-person evaluation if the symptoms worsen or if the condition fails to improve as anticipated.   I provided 30 minutes of non-face-to-face time during this encounter.   Lennox Grumbles, LCSW    10/10/2022

## 2022-10-19 ENCOUNTER — Other Ambulatory Visit: Payer: Self-pay | Admitting: Internal Medicine

## 2022-10-19 DIAGNOSIS — M79641 Pain in right hand: Secondary | ICD-10-CM

## 2022-10-19 DIAGNOSIS — R768 Other specified abnormal immunological findings in serum: Secondary | ICD-10-CM

## 2022-10-19 NOTE — Telephone Encounter (Signed)
Last Fill: 09/20/2022  Eye exam: Not on file   Labs: 07/30/2022 Glucose 129, Calcium 8.4  Next Visit: 03/28/2023  Last Visit: 09/25/2022  PJ:[id number] ANA   Current Dose per office note 09/25/2022: HCQ 400 mg daily.   Contacted patient and advised PLQ Eye Exam was due. Patient states she has an eye exam scheduled for 11/14/2022.  Okay to refill Plaquenil?

## 2022-10-27 ENCOUNTER — Encounter (HOSPITAL_COMMUNITY): Payer: Self-pay | Admitting: *Deleted

## 2022-10-27 ENCOUNTER — Emergency Department (HOSPITAL_COMMUNITY): Payer: 59

## 2022-10-27 ENCOUNTER — Emergency Department (HOSPITAL_COMMUNITY)
Admission: EM | Admit: 2022-10-27 | Discharge: 2022-10-27 | Disposition: A | Discharge status: Home / Self Care | Payer: 59 | Attending: Emergency Medicine | Admitting: Emergency Medicine

## 2022-10-27 ENCOUNTER — Other Ambulatory Visit: Payer: Self-pay

## 2022-10-27 DIAGNOSIS — Z7982 Long term (current) use of aspirin: Secondary | ICD-10-CM | POA: Insufficient documentation

## 2022-10-27 DIAGNOSIS — Z794 Long term (current) use of insulin: Secondary | ICD-10-CM | POA: Diagnosis not present

## 2022-10-27 DIAGNOSIS — Z91040 Latex allergy status: Secondary | ICD-10-CM | POA: Diagnosis not present

## 2022-10-27 DIAGNOSIS — R079 Chest pain, unspecified: Secondary | ICD-10-CM | POA: Diagnosis present

## 2022-10-27 DIAGNOSIS — K3184 Gastroparesis: Secondary | ICD-10-CM | POA: Insufficient documentation

## 2022-10-27 DIAGNOSIS — E1143 Type 2 diabetes mellitus with diabetic autonomic (poly)neuropathy: Secondary | ICD-10-CM | POA: Insufficient documentation

## 2022-10-27 DIAGNOSIS — E876 Hypokalemia: Secondary | ICD-10-CM | POA: Diagnosis not present

## 2022-10-27 DIAGNOSIS — Z7984 Long term (current) use of oral hypoglycemic drugs: Secondary | ICD-10-CM | POA: Diagnosis not present

## 2022-10-27 DIAGNOSIS — R0789 Other chest pain: Secondary | ICD-10-CM

## 2022-10-27 LAB — CBC
HCT: 40.2 % (ref 36.0–46.0)
Hemoglobin: 13 g/dL (ref 12.0–15.0)
MCH: 29.8 pg (ref 26.0–34.0)
MCHC: 32.3 g/dL (ref 30.0–36.0)
MCV: 92.2 fL (ref 80.0–100.0)
Platelets: 266 10*3/uL (ref 150–400)
RBC: 4.36 MIL/uL (ref 3.87–5.11)
RDW: 12.6 % (ref 11.5–15.5)
WBC: 7.4 10*3/uL (ref 4.0–10.5)
nRBC: 0 % (ref 0.0–0.2)

## 2022-10-27 LAB — BASIC METABOLIC PANEL
Anion gap: 7 (ref 5–15)
BUN: 13 mg/dL (ref 6–20)
CO2: 28 mmol/L (ref 22–32)
Calcium: 9.2 mg/dL (ref 8.9–10.3)
Chloride: 101 mmol/L (ref 98–111)
Creatinine, Ser: 0.75 mg/dL (ref 0.44–1.00)
GFR, Estimated: >60 mL/min (ref >60)
Glucose, Bld: 101 mg/dL — ABNORMAL HIGH (ref 70–99)
Potassium: 4 mmol/L (ref 3.5–5.1)
Sodium: 136 mmol/L (ref 135–145)

## 2022-10-27 LAB — TROPONIN I (HIGH SENSITIVITY): Troponin I (High Sensitivity): 2 ng/L (ref <18)

## 2022-10-27 NOTE — ED Provider Notes (Signed)
[place] EMERGENCY DEPARTMENT AT [facility] Provider Note   CSN: [id number] Arrival date & time: 10/27/22  4098     History  No chief complaint on file.   Lindsay Walsh is a 52 y.o. female.  HPI   This patient is a 52 year old female presenting with a complaint of abnormal labs, she was told that she had low potassium from the blood work at her doctor's office within the last couple of days.  She reports that she has been having chest pain for quite some time, she is having chest pain at this time and has been going on for at least 24 hours.  She also has no fevers no vomiting no shortness of breath, no swelling of the legs.  Home Medications Prior to Admission medications   Medication Sig Start Date End Date Taking? Authorizing Provider  ACCU-CHEK GUIDE test strip USE TO CHECK BLOOD SUGAR FOUR TIMES DAILY 06/17/21   [provider]  Accu-Chek Softclix Lancets lancets CHECK SUGAR ONCE DAILY 08/11/20   [provider]  amphetamine-dextroamphetamine (ADDERALL) 20 MG tablet Take 1 tablet (20 mg total) by mouth 2 (two) times daily. 08/18/22   Myrlene Broker, MD  amphetamine-dextroamphetamine (ADDERALL) 20 MG tablet Take 1 tablet (20 mg total) by mouth 2 (two) times daily. 08/18/22 08/18/23  Myrlene Broker, MD  amphetamine-dextroamphetamine (ADDERALL) 20 MG tablet Take 1 tablet (20 mg total) by mouth 2 (two) times daily. 08/18/22 08/18/23  Myrlene Broker, MD  aspirin EC 81 MG tablet Take 81 mg by mouth daily. Swallow whole.    [provider]  atenolol (TENORMIN) 100 MG tablet Take 100 mg by mouth daily. 05/05/22   [provider]  azaTHIOprine (IMURAN) 50 MG tablet Take 1 tablet (50 mg total) by mouth daily. 11/15/21   [name], Joline Maxcy, MD  azithromycin (ZITHROMAX Z-PAK) 250 MG tablet 2 po day one, then 1 daily x 4 days Patient not taking: Reported on 09/25/2022 07/30/22   Bethann Berkshire, MD  bimatoprost (LUMIGAN) 0.01 % SOLN Place 1 drop into both  eyes at bedtime.    [provider]  Calcium Citrate-Vitamin D3 (CALCIUM CITRATE +D) 315-6.25 MG-MCG TABS Take 1 tablet by mouth 2 (two) times daily.    [provider]  cholecalciferol (VITAMIN D3) 25 MCG (1000 UNIT) tablet Take 2,000 Units by mouth daily.    [provider]  clindamycin (CLINDAGEL) 1 % gel Apply topically 2 (two) times daily. 05/10/22   [provider]  cycloSPORINE (RESTASIS) 0.05 % ophthalmic emulsion Place 1 drop into both eyes 2 (two) times daily.    [provider]  dicyclomine (BENTYL) 10 MG capsule Take 1 capsule (10 mg total) by mouth 2 (two) times daily as needed. 11/15/21   [name], Joline Maxcy, MD  Dulaglutide (TRULICITY) 0.75 MG/0.5ML SOPN Inject 0.75 mg into the skin once a week. 03/02/21   Elenore Paddy, NP  DULoxetine (CYMBALTA) 60 MG capsule TAKE 1 CAPSULE(60 MG) BY MOUTH TWICE DAILY 08/18/22   Myrlene Broker, MD  esomeprazole (NEXIUM) 40 MG capsule TAKE 1 CAPSULE(40 MG) BY MOUTH EVERY MORNING 08/01/22   Marguerita Merles, Reuel Boom, MD  FARXIGA 10 MG TABS tablet Take 10 mg by mouth daily. 11/05/21   [provider]  fexofenadine (ALLEGRA) 180 MG tablet Take 180 mg by mouth daily.    [provider]  gabapentin (NEURONTIN) 100 MG capsule Take 100 mg by mouth 3 (three) times daily.    [provider]  HYDROcodone-acetaminophen (NORCO/VICODIN) 5-325 MG tablet Take 1 tablet by mouth 2 (two) times daily as needed for moderate pain. 0.5 -1 po bid prn    [provider]  hydroxychloroquine (PLAQUENIL) 200 MG tablet TAKE 1 TABLET(200 MG) BY MOUTH TWICE DAILY 10/19/22   Rice, Jamesetta Orleans, MD  lisinopril (ZESTRIL) 40 MG tablet TAKE 1 TABLET(40 MG) BY MOUTH DAILY Patient taking differently: Take 40 mg by mouth daily. 12/02/20   Wilson Singer, MD  metFORMIN (GLUCOPHAGE) 1000 MG tablet Take 1 tablet (1,000 mg total) by mouth 2 (two) times daily with a meal. 08/04/20   Gosrani, Nimish C, MD  methocarbamol  (ROBAXIN) 500 MG tablet Take 500 mg by mouth 2 (two) times daily as needed for muscle spasms.    [provider]  metoCLOPramide (REGLAN) 5 MG tablet Take 1 tablet (5 mg total) by mouth 3 (three) times daily before meals. 05/23/22   [name], MD  mirabegron ER (MYRBETRIQ) 50 MG TB24 tablet Take 1 tablet (50 mg total) by mouth daily. Patient not taking: Reported on 04/18/2022 11/22/20   Malen Gauze, MD  Multiple Vitamins-Minerals (CENTRUM) tablet Take 1 tablet by mouth daily.    [provider]  nitroGLYCERIN (NITROLINGUAL) 0.4 MG/SPRAY spray Place 1 spray under the tongue every 5 (five) minutes x 3 doses as needed for chest pain. 02/14/22   [provider]  NP THYROID 60 MG tablet TAKE 1 TABLET(60 MG) BY MOUTH DAILY BEFORE BREAKFAST Patient taking differently: Take 60 mg by mouth daily before breakfast. 06/01/20   Wilson Singer, MD  Polyvinyl Alcohol-Povidone (REFRESH OP) Place 1 drop into both eyes daily as needed (dry eyes).    [provider]  prazosin (MINIPRESS) 2 MG capsule TAKE 1 CAPSULE(2 MG) BY MOUTH AT BEDTIME 08/18/22   Myrlene Broker, MD  PRESCRIPTION MEDICATION Take 10 mg by mouth 3 (three) times daily before meals. This is MOTILLIUM ( DOMPERIDONE 10 mg) Tablet 02/14/20   [provider]  PREVIDENT 5000 PLUS 1.1 % CREA dental cream Place 1 Application onto teeth every morning. 01/17/22   [provider]  Probiotic Product (PROBIOTIC PO) Take 1 capsule by mouth daily.    [provider]  propafenone (RYTHMOL SR) 225 MG 12 hr capsule Take 225 mg by mouth every 12 (twelve) hours.    [provider]  REPATHA 140 MG/ML SOSY Inject 1 mL into the skin every 14 (fourteen) days. 05/17/22   [provider]  rosuvastatin (CRESTOR) 40 MG tablet Take 40 mg by mouth daily. 02/10/20   [provider]  spironolactone (ALDACTONE) 25 MG tablet Take 25 mg by mouth daily. 09/11/21   [provider]      Allergies    Doxycycline, Fentanyl, Cefoxitin, Ceftin [cefuroxime axetil], Iodinated contrast media, Iohexol, Norvasc [amlodipine besylate], Trintellix [vortioxetine], Wellbutrin [bupropion hcl], Latex, and Tape    Review of Systems   Review of Systems  All other systems reviewed and are negative.   Physical Exam Updated Vital Signs BP 115/89 (BP Location: Right Arm)   Pulse 100   Temp 98.1 F (36.7 C) (Oral)   Resp 20   Ht 1.524 m (5')   Wt 83.9 kg   SpO2 99%   BMI 36.13 kg/m  Physical Exam Vitals and nursing note reviewed.  Constitutional:      General: She is not in acute distress.    Appearance: She is well-developed.  HENT:  Head: Normocephalic and atraumatic.     Mouth/Throat:     Pharynx: No oropharyngeal exudate.  Eyes:     General: No scleral icterus.       Right eye: No discharge.        Left eye: No discharge.     Conjunctiva/sclera: Conjunctivae normal.     Pupils: Pupils are equal, round, and reactive to light.  Neck:     Thyroid: No thyromegaly.     Vascular: No JVD.  Cardiovascular:     Rate and Rhythm: Normal rate and regular rhythm.     Heart sounds: Normal heart sounds. No murmur heard.    No friction rub. No gallop.  Pulmonary:     Effort: Pulmonary effort is normal. No respiratory distress.     Breath sounds: Normal breath sounds. No wheezing or rales.  Abdominal:     General: Bowel sounds are normal. There is no distension.     Palpations: Abdomen is soft. There is no mass.     Tenderness: There is no abdominal tenderness.  Musculoskeletal:        General: No tenderness. Normal range of motion.     Cervical back: Normal range of motion and neck supple.     Right lower leg: No edema.     Left lower leg: No edema.  Lymphadenopathy:     Cervical: No cervical adenopathy.  Skin:    General: Skin is warm and dry.     Findings: No erythema or rash.  Neurological:     Mental Status: She is alert.     Coordination:  Coordination normal.  Psychiatric:        Behavior: Behavior normal.     ED Results / Procedures / Treatments   Labs (all labs ordered are listed, but only abnormal results are displayed) Labs Reviewed  BASIC METABOLIC PANEL - Abnormal; Notable for the following components:      Result Value   Glucose, Bld 101 (*)    All other components within normal limits  CBC  TROPONIN I (HIGH SENSITIVITY)    EKG EKG Interpretation  Date/Time:  Friday October 27 2022 16:53:26 EDT Ventricular Rate:  106 PR Interval:  154 QRS Duration: 92 QT Interval:  332 QTC Calculation: 441 R Axis:   87 Text Interpretation: Sinus tachycardia ST & T wave abnormality, consider inferior ischemia Abnormal ECG When compared with ECG of 30-Jul-2022 07:12, PREVIOUS ECG IS PRESENT since last tracing no significant change Confirmed by Eber Hong ([id number]) on 10/27/2022 5:09:04 PM  Radiology DG Chest 2 View  Result Date: 10/27/2022 CLINICAL DATA:  Chest pain. EXAM: CHEST - 2 VIEW COMPARISON:  Chest x-ray 07/30/2022. FINDINGS: The heart size and mediastinal contours are within normal limits. Both lungs are clear. No visible pleural effusions or pneumothorax. No acute osseous abnormality. IMPRESSION: No active cardiopulmonary disease. Electronically Signed   By: Feliberto Harts M.D.   On: 10/27/2022 17:11    Procedures Procedures    Medications Ordered in ED Medications - No data to display  ED Course/ Medical Decision Making/ A&P                             Medical Decision Making Amount and/or Complexity of Data Reviewed Labs: ordered. Radiology: ordered.   This patient presents to the ED for concern of low potassium or chest pain differential diagnosis includes coronary syndrome, pneumothorax, pneumonia, electrolyte abnormalities    Additional history obtained:  Additional history obtained from electronic medical record External records from outside source obtained and reviewed including no  recent admissions to the hospital, seen in the ED, but no recent admissions.  Followed in the office for diabetic gastroparesis Heart catheterization performed in October of last year, 2023.  Showed some moderate obstructive disease but nothing that needed to be intervened upon, medical therapy for moderate coronary disease  Lab Tests:  I Ordered, and personally interpreted labs.  The pertinent results include: CBC metabolic panel troponin all unremarkable, no hypokalemia, no elevation of the troponin   Imaging Studies ordered:  I ordered imaging studies including chest x-ray I independently visualized and interpreted imaging which showed no acute findings I agree with the radiologist interpretation   Medicines ordered and prescription drug management:   I have reviewed the patients home medicines and have made adjustments as needed   Problem List / ED Course:  Chest pain, likely noncardiac given negative workup, has been constant, not exertional.  Hypokalemia is not present in fact her labs are normal, stable for discharge   Social Determinants of Health:     I have discussed with the patient at the bedside the results, and the meaning of these results.  They have expressed her understanding to the need for follow-up with primary care physician        Final Clinical Impression(s) / ED Diagnoses Final diagnoses:  Non-cardiac chest pain    Rx / DC Orders ED Discharge Orders     None         Eber Hong, MD 10/27/22 1940

## 2022-10-27 NOTE — ED Provider Triage Note (Signed)
Emergency Medicine Provider Triage Evaluation Note  Nyelli JENAVIVE LAMBOYa 52 y.o. female  was evaluated in triage.  Pt complains of abnormal labs including low potassium, has also had chest pain for couple of days.  Review of Systems  Positive: Chest pain, weakness Negative: Shortness of breath, fever, vomiting, diarrhea  Physical Exam  BP 115/89 (BP Location: Right Arm)   Pulse 100   Temp 98.1 F (36.7 C) (Oral)   Resp 20   Ht 1.524 m (5')   Wt 83.9 kg   SpO2 99%   BMI 36.13 kg/m  Gen:   Awake, no distress   Resp:  Normal effort  MSK:   Moves extremities without difficulty  Other:  Abdomen soft nontender, no edema, normal cardiac exam  Medical Decision Making  Medically screening exam initiated at 5:30 PM.  Appropriate orders placed.  JOSEFINA RYNDERS was informed that the remainder of the evaluation will be completed by another provider, this initial triage assessment does not replace that evaluation, and the importance of remaining in the ED until their evaluation is complete.  Labs pending, recheck potassium, EKG unremarkable.   Eber Hong, MD 10/27/22 [phone]

## 2022-10-27 NOTE — ED Triage Notes (Signed)
Pt states her doctor called her and told her to go to ED for low potassium that was drawn yesterday.  Pt states she has been feeling lightheaded and CP last night.

## 2022-10-27 NOTE — Discharge Instructions (Signed)
Thankfully your test show nose signs of heart attack, no signs of low potassium, all of your blood work was normal, your EKG was reassuring.  You can follow-up with your doctor this week as needed  Thank you for allowing [place] to treat you in the emergency department today.  After reviewing your examination and potential testing that was done it appears that you are safe to go home.  I would like for you to follow-up with your doctor within the next several days, have them obtain your results and follow-up with them to review all of these tests.  If you should develop severe or worsening symptoms return to the emergency department immediately

## 2022-11-08 ENCOUNTER — Ambulatory Visit: Payer: 59 | Admitting: Nutrition

## 2022-11-14 LAB — HM DIABETES EYE EXAM

## 2022-11-16 ENCOUNTER — Telehealth (INDEPENDENT_AMBULATORY_CARE_PROVIDER_SITE_OTHER): Payer: 59 | Admitting: Psychiatry

## 2022-11-16 ENCOUNTER — Encounter (HOSPITAL_COMMUNITY): Payer: Self-pay | Admitting: Psychiatry

## 2022-11-16 DIAGNOSIS — F900 Attention-deficit hyperactivity disorder, predominantly inattentive type: Secondary | ICD-10-CM

## 2022-11-16 DIAGNOSIS — F4312 Post-traumatic stress disorder, chronic: Secondary | ICD-10-CM

## 2022-11-16 DIAGNOSIS — F4310 Post-traumatic stress disorder, unspecified: Secondary | ICD-10-CM | POA: Diagnosis not present

## 2022-11-16 DIAGNOSIS — F515 Nightmare disorder: Secondary | ICD-10-CM | POA: Diagnosis not present

## 2022-11-16 DIAGNOSIS — F32A Depression, unspecified: Secondary | ICD-10-CM

## 2022-11-16 MED ORDER — AMPHETAMINE-DEXTROAMPHETAMINE 20 MG PO TABS: 20.0000 mg | ORAL_TABLET | Freq: Two times a day (BID) | ORAL | 0 refills | Status: DC

## 2022-11-16 MED ORDER — DULOXETINE HCL 60 MG PO CPEP: ORAL_CAPSULE | ORAL | 2 refills | Status: DC

## 2022-11-16 MED ORDER — PRAZOSIN HCL 2 MG PO CAPS: ORAL_CAPSULE | ORAL | 2 refills | Status: DC

## 2022-11-16 NOTE — Progress Notes (Signed)
Virtual Visit via Video Note  I connected with Lindsay Walsh on 11/16/22 at 11:20 AM EDT by a video enabled telemedicine application and verified that I am speaking with the correct person using two identifiers.  Location: Patient: home Provider: office   I discussed the limitations of evaluation and management by telemedicine and the availability of in person appointments. The patient expressed understanding and agreed to proceed.    I discussed the assessment and treatment plan with the patient. The patient was provided an opportunity to ask questions and all were answered. The patient agreed with the plan and demonstrated an understanding of the instructions.   The patient was advised to call back or seek an in-person evaluation if the symptoms worsen or if the condition fails to improve as anticipated.  I provided 15 minutes of non-face-to-face time during this encounter.   Lindsay Ruder, MD  [facility] MD/PA/NP OP Progress Note  11/16/2022 11:32 AM Lindsay Walsh Lindsay Walsh  MRN:  [id number]  Chief Complaint:  Chief Complaint  Patient presents with   Anxiety   Depression   ADD   Follow-up   HPI: This patient is a 52 year old divorced white female who lives with her boyfriend in [place]. She is on disability for an autoimmune liver disease. She has 3 children and one granddaughter    The patient returns for follow-up after 3 months regarding her depression anxiety PTSD and ADD.  She states that overall she is continuing to do well.  She is still dealing with having lupus and autoimmune hepatitis etc.  She also has shoulder pain and is undergoing physical therapy.  She states that this is really helped her mood and she is moving more and getting out and talking with people.  She is continuing to try to eat healthy.  She denies significant depression or thoughts of self-harm or suicide.  She is sleeping well without nightmares.  The Adderall continues to help with her focus Visit  Diagnosis:    ICD-10-CM   1. PTSD (post-traumatic stress disorder)  F43.10     2. Nightmares associated with chronic post-traumatic stress disorder  F51.5 prazosin (MINIPRESS) 2 MG capsule   F43.12     3. Attention deficit hyperactivity disorder (ADHD), predominantly inattentive type  F90.0     4. Depression, unspecified depression type  F32.A       Past Psychiatric History: Hospitalization in early 20s for depression  Past Medical History:  Past Medical History:  Diagnosis Date   Acid reflux    Allergic rhinitis due to pollen    Anxiety    Arthritis    back   Autoimmune hepatitis (HCC)    Back pain    Connective tissue disease (HCC)    Depression    Dizziness and giddiness    Dyspareunia 05/06/2014   Dysrhythmia    palpatations; on propafenone   Essential (primary) hypertension    Fibromyalgia    Gastroesophageal reflux disease    Chronic abdominal pain; gastroparesis; globus hystericus; irritable bowel syndrome   Glaucoma    Hereditary and idiopathic neuropathy, unspecified    Hyperlipidemia    Irritable bowel syndrome    Major depressive disorder, single episode, unspecified    Morbid (severe) obesity due to excess calories (HCC)    Narcotic dependence (HCC)    Nonspecific mesenteric lymphadenitis    Overweight(278.02)    Pain in limb    Pain in right foot    PTSD (post-traumatic stress disorder)    Pulmonary nodule  Restless legs syndrome    Sleep apnea    on CPAP machine   Stroke (HCC) 2016   Systemic lupus erythematosus (SLE) in adult [facility])    Tobacco abuse    one pack per day; 35 pack years; quit in February 2023   Type 2 diabetes mellitus with hyperglycemia (HCC)     Past Surgical History:  Procedure Laterality Date   ABDOMINAL HYSTERECTOMY     TAH&BSO   BACK SURGERY     BLADDER SUSPENSION  09/12/2011   Procedure: TRANSVAGINAL TAPE (TVT) PROCEDURE;  Surgeon: Ky Barban, MD;  Location: [facility];  Service: Urology;  Laterality: N/A;    CESAREAN SECTION     X3   CHOLECYSTECTOMY     CORONARY PRESSURE/FFR STUDY N/A 04/21/2022   Procedure: INTRAVASCULAR PRESSURE WIRE/FFR STUDY;  Surgeon: Marykay Lex, MD;  Location: [facility] INVASIVE CV LAB;  Service: Cardiovascular;  Laterality: N/A;   ESOPHAGOGASTRODUODENOSCOPY ENDOSCOPY     "throat stretched" per pt.   LEFT HEART CATH AND CORONARY ANGIOGRAPHY N/A 04/21/2022   Procedure: LEFT HEART CATH AND CORONARY ANGIOGRAPHY;  Surgeon: Marykay Lex, MD;  Location: [facility] INVASIVE CV LAB;  Service: Cardiovascular;  Laterality: N/A;   LIVER BIOPSY     x4   LUMBAR EPIDURAL INJECTION  01/2012   TOTAL ABDOMINAL HYSTERECTOMY W/ BILATERAL SALPINGOOPHORECTOMY     TUBAL LIGATION     Bilateral   UPPER GASTROINTESTINAL ENDOSCOPY  09/13/2010    Family Psychiatric History: See below  Family History:  Family History  Problem Relation Age of Onset   Diabetes Mother    Hypertension Mother    Anxiety disorder Mother    Depression Mother    Drug abuse Mother    Heart disease Father    Diabetes Father    Anxiety disorder Father    Alcohol abuse Father    Depression Father    OCD Father    Thyroid disease Sister    Healthy Brother    Healthy Daughter    Healthy Son    ADD / ADHD Son    Alcohol abuse Paternal Grandfather    Depression Paternal Grandfather    Cancer Paternal Grandfather        lung,skin   Tuberculosis Paternal Grandfather    Seizures Paternal Grandmother    Lupus Paternal Grandmother    ADD / ADHD Son    Anesthesia problems Neg Hx    Malignant hyperthermia Neg Hx    Pseudochol deficiency Neg Hx    Hypotension Neg Hx    Bipolar disorder Neg Hx    Dementia Neg Hx    Paranoid behavior Neg Hx    Schizophrenia Neg Hx    Sexual abuse Neg Hx    Physical abuse Neg Hx     Social History:  Social History   Socioeconomic History   Marital status: Divorced    Spouse name: Not on file   Number of children: 3   Years of education: GED   Highest education level: Not on  file  Occupational History    Employer: NOT EMPLOYED  Tobacco Use   Smoking status: Former    Packs/day: 1.50    Years: 25.00    Additional pack years: 0.00    Total pack years: 37.50    Types: Cigarettes    Quit date: 04/25/2013    Years since quitting: 9.5    Passive exposure: Never   Smokeless tobacco: Never  Vaping Use   Vaping Use: Some days  Start date: 04/22/2013   Substances: Nicotine, Flavoring  Substance and Sexual Activity   Alcohol use: No   Drug use: No   Sexual activity: Yes    Birth control/protection: Surgical, Abstinence  Other Topics Concern   Not on file  Social History Narrative   Divorced since 1994.Lives with boyfriend of 11 years.On disability.   Social Determinants of Health   Financial Resource Strain: Not on file  Food Insecurity: Not on file  Transportation Needs: Not on file  Physical Activity: Not on file  Stress: Not on file  Social Connections: Not on file    Allergies:  Allergies  Allergen Reactions   Doxycycline Shortness Of Breath and Rash    Heart racing   Fentanyl Shortness Of Breath, Itching and Other (See Comments)    Heart racing, SOB   Cefoxitin     Unknown reaction   Ceftin [Cefuroxime Axetil]     Unknown reaction   Iodinated Contrast Media Other (See Comments)    Knots on body   Iohexol Other (See Comments)    Knots on body    Norvasc [Amlodipine Besylate]     Unknown reaction   Trintellix [Vortioxetine]     Tremors, heart race   Wellbutrin [Bupropion Hcl] Nausea And Vomiting   Latex Rash   Tape Rash    Metabolic Disorder Labs: Lab Results  Component Value Date   HGBA1C 11.4 (H) 08/03/2020   MPG 280 08/03/2020   MPG 237.43 08/11/2018   No results found for: "PROLACTIN" Lab Results  Component Value Date   CHOL 153 04/25/2019   TRIG 141 04/25/2019   HDL 37 04/25/2019   CHOLHDL 4.9 03/06/2015   VLDL 16 03/06/2015   LDLCALC 91 04/25/2019   LDLCALC 104 (H) 03/06/2015   Lab Results  Component Value  Date   TSH 4.616 (H) 05/23/2022   TSH 2.79 08/03/2020    Therapeutic Level Labs: No results found for: "LITHIUM" No results found for: "VALPROATE" No results found for: "CBMZ"  Current Medications: Current Outpatient Medications  Medication Sig Dispense Refill   ACCU-CHEK GUIDE test strip USE TO CHECK BLOOD SUGAR FOUR TIMES DAILY     Accu-Chek Softclix Lancets lancets CHECK SUGAR ONCE DAILY     amphetamine-dextroamphetamine (ADDERALL) 20 MG tablet Take 1 tablet (20 mg total) by mouth 2 (two) times daily. 60 tablet 0   amphetamine-dextroamphetamine (ADDERALL) 20 MG tablet Take 1 tablet (20 mg total) by mouth 2 (two) times daily. 60 tablet 0   amphetamine-dextroamphetamine (ADDERALL) 20 MG tablet Take 1 tablet (20 mg total) by mouth 2 (two) times daily. 60 tablet 0   aspirin EC 81 MG tablet Take 81 mg by mouth daily. Swallow whole.     atenolol (TENORMIN) 100 MG tablet Take 100 mg by mouth daily.     azaTHIOprine (IMURAN) 50 MG tablet Take 1 tablet (50 mg total) by mouth daily. 90 tablet 1   azithromycin (ZITHROMAX Z-PAK) 250 MG tablet 2 po day one, then 1 daily x 4 days (Patient not taking: Reported on 09/25/2022) 6 tablet 0   bimatoprost (LUMIGAN) 0.01 % SOLN Place 1 drop into both eyes at bedtime.     Calcium Citrate-Vitamin D3 (CALCIUM CITRATE +D) 315-6.25 MG-MCG TABS Take 1 tablet by mouth 2 (two) times daily.     cholecalciferol (VITAMIN D3) 25 MCG (1000 UNIT) tablet Take 2,000 Units by mouth daily.     clindamycin (CLINDAGEL) 1 % gel Apply topically 2 (two) times daily.  cycloSPORINE (RESTASIS) 0.05 % ophthalmic emulsion Place 1 drop into both eyes 2 (two) times daily.     dicyclomine (BENTYL) 10 MG capsule Take 1 capsule (10 mg total) by mouth 2 (two) times daily as needed. 60 capsule 2   Dulaglutide (TRULICITY) 0.75 MG/0.5ML SOPN Inject 0.75 mg into the skin once a week. 2 mL 3   DULoxetine (CYMBALTA) 60 MG capsule TAKE 1 CAPSULE(60 MG) BY MOUTH TWICE DAILY 60 capsule 2    esomeprazole (NEXIUM) 40 MG capsule TAKE 1 CAPSULE(40 MG) BY MOUTH EVERY MORNING 90 capsule 1   FARXIGA 10 MG TABS tablet Take 10 mg by mouth daily.     fexofenadine (ALLEGRA) 180 MG tablet Take 180 mg by mouth daily.     gabapentin (NEURONTIN) 100 MG capsule Take 100 mg by mouth 3 (three) times daily.     HYDROcodone-acetaminophen (NORCO/VICODIN) 5-325 MG tablet Take 1 tablet by mouth 2 (two) times daily as needed for moderate pain. 0.5 -1 po bid prn     hydroxychloroquine (PLAQUENIL) 200 MG tablet TAKE 1 TABLET(200 MG) BY MOUTH TWICE DAILY 60 tablet 0   lisinopril (ZESTRIL) 40 MG tablet TAKE 1 TABLET(40 MG) BY MOUTH DAILY (Patient taking differently: Take 40 mg by mouth daily.) 90 tablet 0   metFORMIN (GLUCOPHAGE) 1000 MG tablet Take 1 tablet (1,000 mg total) by mouth 2 (two) times daily with a meal. 60 tablet 3   methocarbamol (ROBAXIN) 500 MG tablet Take 500 mg by mouth 2 (two) times daily as needed for muscle spasms.     metoCLOPramide (REGLAN) 5 MG tablet Take 1 tablet (5 mg total) by mouth 3 (three) times daily before meals. 210 tablet 1   mirabegron ER (MYRBETRIQ) 50 MG TB24 tablet Take 1 tablet (50 mg total) by mouth daily. (Patient not taking: Reported on 04/18/2022) 30 tablet 11   Multiple Vitamins-Minerals (CENTRUM) tablet Take 1 tablet by mouth daily.     nitroGLYCERIN (NITROLINGUAL) 0.4 MG/SPRAY spray Place 1 spray under the tongue every 5 (five) minutes x 3 doses as needed for chest pain.     NP THYROID 60 MG tablet TAKE 1 TABLET(60 MG) BY MOUTH DAILY BEFORE BREAKFAST (Patient taking differently: Take 60 mg by mouth daily before breakfast.) 30 tablet 3   Polyvinyl Alcohol-Povidone (REFRESH OP) Place 1 drop into both eyes daily as needed (dry eyes).     prazosin (MINIPRESS) 2 MG capsule TAKE 1 CAPSULE(2 MG) BY MOUTH AT BEDTIME 30 capsule 2   PRESCRIPTION MEDICATION Take 10 mg by mouth 3 (three) times daily before meals. This is MOTILLIUM ( DOMPERIDONE 10 mg) Tablet     PREVIDENT 5000  PLUS 1.1 % CREA dental cream Place 1 Application onto teeth every morning.     Probiotic Product (PROBIOTIC PO) Take 1 capsule by mouth daily.     propafenone (RYTHMOL SR) 225 MG 12 hr capsule Take 225 mg by mouth every 12 (twelve) hours.     REPATHA 140 MG/ML SOSY Inject 1 mL into the skin every 14 (fourteen) days.     rosuvastatin (CRESTOR) 40 MG tablet Take 40 mg by mouth daily.     spironolactone (ALDACTONE) 25 MG tablet Take 25 mg by mouth daily.     No current facility-administered medications for this visit.     Musculoskeletal: Strength & Muscle Tone: within normal limits Gait & Station: normal Patient leans: N/A  Psychiatric Specialty Exam: Review of Systems  Musculoskeletal:  Positive for arthralgias.  All other systems reviewed and  are negative.   There were no vitals taken for this visit.There is no height or weight on file to calculate BMI.  General Appearance: Casual and Fairly Groomed  Eye Contact:  Good  Speech:  Clear and Coherent  Volume:  Normal  Mood:  Euthymic  Affect:  Congruent  Thought Process:  Goal Directed  Orientation:  Full (Time, Place, and Person)  Thought Content: Rumination   Suicidal Thoughts:  No  Homicidal Thoughts:  No  Memory:  Immediate;   Good Recent;   Good Remote;   Good  Judgement:  Good  Insight:  Good  Psychomotor Activity:  Normal  Concentration:  Concentration: Good and Attention Span: Good  Recall:  Good  Fund of Knowledge: Good  Language: Good  Akathisia:  No  Handed:  Right  AIMS (if indicated): not done  Assets:  Communication Skills Desire for Improvement Resilience Social Support Talents/Skills  ADL's:  Intact  Cognition: WNL  Sleep:  Good   Screenings: GAD-7    Advertising copywriter from 09/04/2022 in [facility] [place]  Total GAD-7 Score 19      PHQ2-9    Flowsheet Row Counselor from 09/04/2022 in [facility] [place] Video Visit  from 02/08/2022 in [facility] [place] Video Visit from 11/09/2021 in [facility] at [place] Video Visit from 08/12/2021 in [facility] [place] Video Visit from 04/15/2021 in [facility] Total Score 6 2 0 1 1  PHQ-9 Total Score 17 7 -- -- 2      Flowsheet Row ED from 10/27/2022 in [facility] Department at [facility] Counselor from 09/04/2022 in [facility] [place] ED from 07/30/2022 in [facility] Emergency Department at [facility]  C-SSRS RISK CATEGORY No Risk No Risk No Risk        Assessment and Plan: This patient is a 52 year old female with a history of PTSD nightmares depression anxiety and ADHD.  She seems to be doing better since she started the physical therapy.  She will continue Cymbalta 60 mg twice daily for depression, prazosin 2 mg at bedtime for nightmares and sleep and Adderall 20 mg twice daily for ADHD.  She will return to see me in 3 months  Collaboration of Care: Collaboration of Care: Referral or follow-up with counselor/therapist AEB patient will continue therapy with Suzan Garibaldi in our office  Patient/Guardian was advised Release of Information must be obtained prior to any record release in order to collaborate their care with an outside provider. Patient/Guardian was advised if they have not already done so to contact the registration department to sign all necessary forms in order for [place] to release information regarding their care.   Consent: Patient/Guardian gives verbal consent for treatment and assignment of benefits for services provided during this visit. Patient/Guardian expressed understanding and agreed to proceed.    Lindsay Ruder, MD 11/16/2022, 11:32 AM

## 2022-11-17 ENCOUNTER — Other Ambulatory Visit: Payer: Self-pay | Admitting: Internal Medicine

## 2022-11-17 DIAGNOSIS — R768 Other specified abnormal immunological findings in serum: Secondary | ICD-10-CM

## 2022-11-17 DIAGNOSIS — M79641 Pain in right hand: Secondary | ICD-10-CM

## 2022-11-17 NOTE — Telephone Encounter (Signed)
Patient has been asked multiple times for her Plaquenil eye exam. Attempted to contact the patient and left a message to call the office back regarding her Plaquenil eye exam.

## 2022-11-20 ENCOUNTER — Ambulatory Visit (HOSPITAL_COMMUNITY): Payer: 59 | Admitting: Clinical

## 2022-11-20 ENCOUNTER — Other Ambulatory Visit (HOSPITAL_COMMUNITY): Payer: Self-pay | Admitting: Psychiatry

## 2022-11-23 ENCOUNTER — Ambulatory Visit (INDEPENDENT_AMBULATORY_CARE_PROVIDER_SITE_OTHER): Payer: Medicare Other | Admitting: Gastroenterology

## 2022-11-24 ENCOUNTER — Other Ambulatory Visit: Payer: Self-pay | Admitting: *Deleted

## 2022-11-24 DIAGNOSIS — R768 Other specified abnormal immunological findings in serum: Secondary | ICD-10-CM

## 2022-11-24 DIAGNOSIS — M79642 Pain in left hand: Secondary | ICD-10-CM

## 2022-11-24 NOTE — Telephone Encounter (Signed)
Last Fill: 10/19/2022  Eye exam: Patient states she had PLQ Eye Exam 11/14/2022- Dr. Dione Booze   Labs: 10/27/2022 Glucose 101,   Next Visit: 03/28/2023   Last Visit: 09/25/2022  [id number] ANA (antinuclear antibody  Current Dose per office note 09/25/2022: hydroxychloroquine 200 mg daily   Okay to refill Plaquenil?

## 2022-11-24 NOTE — Telephone Encounter (Signed)
Call Dr. Laruth Bouchard office for eye exam. Office closed on Friday afternoons.

## 2022-11-27 ENCOUNTER — Other Ambulatory Visit: Payer: Self-pay | Admitting: Internal Medicine

## 2022-11-27 DIAGNOSIS — R768 Other specified abnormal immunological findings in serum: Secondary | ICD-10-CM

## 2022-11-27 DIAGNOSIS — M79642 Pain in left hand: Secondary | ICD-10-CM

## 2022-11-27 MED ORDER — HYDROXYCHLOROQUINE SULFATE 200 MG PO TABS: ORAL_TABLET | ORAL | 0 refills | Status: DC

## 2022-11-27 NOTE — Telephone Encounter (Signed)
Dr. Laruth Bouchard office will fax PLQ eye exam to office.

## 2022-12-25 ENCOUNTER — Other Ambulatory Visit: Payer: Self-pay | Admitting: Internal Medicine

## 2022-12-25 DIAGNOSIS — R768 Other specified abnormal immunological findings in serum: Secondary | ICD-10-CM

## 2022-12-25 DIAGNOSIS — M79641 Pain in right hand: Secondary | ICD-10-CM

## 2022-12-25 NOTE — Telephone Encounter (Signed)
Last Fill: 11/27/2022  Eye exam: 5/72024   Labs: 10/27/2022 Glucose 101,   Next Visit: 03/28/2023  Last Visit: 09/25/2022  DX: Positive ANA (antinuclear antibody   Current Dose per office note 09/25/2022: HCQ 400 mg daily.   Okay to refill Plaquenil?

## 2023-01-16 ENCOUNTER — Ambulatory Visit (INDEPENDENT_AMBULATORY_CARE_PROVIDER_SITE_OTHER): Payer: 59 | Admitting: Gastroenterology

## 2023-02-08 ENCOUNTER — Telehealth: Payer: Self-pay | Admitting: Internal Medicine

## 2023-02-08 NOTE — Telephone Encounter (Signed)
Pt is requesting papers faxed over to Dr. Franky Macho about the injection on the wrist and carpel tunnel. Fax number is [phone]. Would like to have it before Tuesday.

## 2023-02-08 NOTE — Telephone Encounter (Signed)
I called patient, faxed

## 2023-02-15 ENCOUNTER — Ambulatory Visit (INDEPENDENT_AMBULATORY_CARE_PROVIDER_SITE_OTHER): Payer: 59 | Admitting: Gastroenterology

## 2023-02-16 ENCOUNTER — Telehealth (HOSPITAL_COMMUNITY): Payer: 59 | Admitting: Psychiatry

## 2023-02-19 ENCOUNTER — Other Ambulatory Visit: Payer: Self-pay | Admitting: Neurosurgery

## 2023-02-19 DIAGNOSIS — M5416 Radiculopathy, lumbar region: Secondary | ICD-10-CM

## 2023-03-01 ENCOUNTER — Telehealth (INDEPENDENT_AMBULATORY_CARE_PROVIDER_SITE_OTHER): Payer: Self-pay | Admitting: *Deleted

## 2023-03-01 ENCOUNTER — Encounter (INDEPENDENT_AMBULATORY_CARE_PROVIDER_SITE_OTHER): Payer: Self-pay | Admitting: Gastroenterology

## 2023-03-01 ENCOUNTER — Ambulatory Visit (INDEPENDENT_AMBULATORY_CARE_PROVIDER_SITE_OTHER): Payer: 59 | Admitting: Gastroenterology

## 2023-03-01 VITALS — BP 119/86 | HR 79 | Temp 97.5°F | Ht 60.0 in | Wt 191.6 lb

## 2023-03-01 DIAGNOSIS — K7400 Hepatic fibrosis, unspecified: Secondary | ICD-10-CM

## 2023-03-01 DIAGNOSIS — K219 Gastro-esophageal reflux disease without esophagitis: Secondary | ICD-10-CM | POA: Diagnosis not present

## 2023-03-01 DIAGNOSIS — K580 Irritable bowel syndrome with diarrhea: Secondary | ICD-10-CM | POA: Diagnosis not present

## 2023-03-01 DIAGNOSIS — K3184 Gastroparesis: Secondary | ICD-10-CM

## 2023-03-01 DIAGNOSIS — K754 Autoimmune hepatitis: Secondary | ICD-10-CM | POA: Diagnosis not present

## 2023-03-01 DIAGNOSIS — E1143 Type 2 diabetes mellitus with diabetic autonomic (poly)neuropathy: Secondary | ICD-10-CM

## 2023-03-01 MED ORDER — METOCLOPRAMIDE HCL 5 MG PO TABS
5.0000 mg | ORAL_TABLET | Freq: Three times a day (TID) | ORAL | 3 refills | Status: DC
Start: 2023-03-01 — End: 2023-10-09

## 2023-03-01 MED ORDER — RIFAXIMIN 550 MG PO TABS
550.0000 mg | ORAL_TABLET | Freq: Three times a day (TID) | ORAL | 0 refills | Status: AC
Start: 2023-03-01 — End: 2023-03-15

## 2023-03-01 MED ORDER — ESOMEPRAZOLE MAGNESIUM 40 MG PO CPDR: 40.0000 mg | DELAYED_RELEASE_CAPSULE | Freq: Every day | ORAL | 3 refills | Status: DC

## 2023-03-01 NOTE — Telephone Encounter (Signed)
Refill sent.

## 2023-03-01 NOTE — Patient Instructions (Addendum)
Perform blood workup Continue azathioprine 50 mg qday Continue Reglan 5 mg TID , but try to take it only with largest meals if possible Eat small portions multiple times per day (5-6 times) Follow up with [facility] Restart esomeprazole 40 mg qday

## 2023-03-01 NOTE — Telephone Encounter (Signed)
Pt seen today and is  requesting refill on reglan  [facility] [address]

## 2023-03-01 NOTE — Progress Notes (Signed)
Lindsay Walsh, M.D. Gastroenterology & Hepatology [facility] Gastroenterology [address] [place], [place] [address]  Primary Care Physician: Lindsay Lofts, MD [address] [place] [address]  I will communicate my assessment and recommendations to the referring MD via EMR.  Problems: autoimmune hepatitis with stage 2-3 fibrosis. Diabetic gastroparesis IBS-D GERD  History of Present Illness: Lindsay Walsh is a 52 y.o. female with Pmh lupus, autoimmune hepatitis with stage II -III fibrosis, diabetic gastroparesis, GERD, depression, fibromyalgia, chronic opiate use, diabetes, hyperlipidemia, IBS, anxiety and obesity,  who presents for follow up of GERD, gastroparesis, IBS-D and autoimmune hepatitis.  The patient was last seen on 05/22/2022. At that time, the patient was advised to continue azathioprine 50 mg every day and to switch to Reglan 5 mg 3 times daily as it was difficult for her to get the domperidone from outside of [place].  Patient reports she has been taking Reglan three times a day before each meal. Feels this has worked better than domperidone as she has not vomited since she started it. Nausea fluctuates in severity but has not vomited anymore.   She is having worsening diarrhea for the last couple months, states that her diarrhea is watery , has at least 5 Bms per day. Has presented previous fecal accidents, has tried avoiding going out of home often. Tried OTC Imodium but did not feel it helped. She feels constantly bloated. Takes hydrocodone once a day. She is taking Trulicity, Cymbalta. Not taking Miralax now.  She was previously taking esomeprazole with previous control of her heartburn but ran out of the medicine and the heartburn came back. She states taking OTC Nexium which helps some but not working perfectly as the prescription dose.  The patient denies having any nausea, vomiting, fever, chills,  hematochezia, melena, hematemesis, abdominal distention, abdominal pain, diarrhea, jaundice, pruritus or weight loss.  The patient is currently followed at [facility] hepatology as well.  The last time she was seen in clinic was on 11/02/2022.  She was advised to continue on azathioprine 50 mg every day.  Patient underwent FibroScan at [facility].  This was performed on 04/08/2022.  She had median kPa of 7.4, with presence of S3 steatosis and F2 fibrosis.  Most recent hepatic function panel from 11/02/2022 showed normal AST of 26, ALT 22, total bili 0.8, alkaline phosphatase 76, albumin 3.7, normal total bilirubin of 0.8.  CBC from 10/27/2022 showed WBC 7.4, hemoglobin 13.0 and platelets 266.  Most recent IgG from 05/22/2022 was normal at 976.  Last EGD: 04/25/2018 - normal Last Colonoscopy: 04/25/2018 - had one tubular adenomas   Repeat colonoscopy in 04/2025.  Past Medical History: Past Medical History:  Diagnosis Date   Acid reflux    Allergic rhinitis due to pollen    Anxiety    Arthritis    back   Autoimmune hepatitis (HCC)    Back pain    Connective tissue disease (HCC)    Depression    Dizziness and giddiness    Dyspareunia 05/06/2014   Dysrhythmia    palpatations; on propafenone   Essential (primary) hypertension    Fibromyalgia    Gastroesophageal reflux disease    Chronic abdominal pain; gastroparesis; globus hystericus; irritable bowel syndrome   Glaucoma    Hereditary and idiopathic neuropathy, unspecified    Hyperlipidemia    Irritable bowel syndrome    Major depressive disorder, single episode, unspecified    Morbid (severe) obesity due to excess  calories (HCC)    Narcotic dependence (HCC)    Nonspecific mesenteric lymphadenitis    Overweight(278.02)    Pain in limb    Pain in right foot    PTSD (post-traumatic stress disorder)    Pulmonary nodule    Restless legs syndrome    Sleep apnea    on CPAP machine   Stroke (HCC) 2016   Systemic lupus  erythematosus (SLE) in adult [facility])    Tobacco abuse    one pack per day; 35 pack years; quit in February 2023   Type 2 diabetes mellitus with hyperglycemia (HCC)     Past Surgical History: Past Surgical History:  Procedure Laterality Date   ABDOMINAL HYSTERECTOMY     TAH&BSO   BACK SURGERY     BLADDER SUSPENSION  09/12/2011   Procedure: TRANSVAGINAL TAPE (TVT) PROCEDURE;  Surgeon: Ky Barban, MD;  Location: [facility];  Service: Urology;  Laterality: N/A;   CESAREAN SECTION     X3   CHOLECYSTECTOMY     CORONARY PRESSURE/FFR STUDY N/A 04/21/2022   Procedure: INTRAVASCULAR PRESSURE WIRE/FFR STUDY;  Surgeon: Marykay Lex, MD;  Location: [facility] INVASIVE CV LAB;  Service: Cardiovascular;  Laterality: N/A;   ESOPHAGOGASTRODUODENOSCOPY ENDOSCOPY     "throat stretched" per pt.   LEFT HEART CATH AND CORONARY ANGIOGRAPHY N/A 04/21/2022   Procedure: LEFT HEART CATH AND CORONARY ANGIOGRAPHY;  Surgeon: Marykay Lex, MD;  Location: [facility] INVASIVE CV LAB;  Service: Cardiovascular;  Laterality: N/A;   LIVER BIOPSY     x4   LUMBAR EPIDURAL INJECTION  01/2012   TOTAL ABDOMINAL HYSTERECTOMY W/ BILATERAL SALPINGOOPHORECTOMY     TUBAL LIGATION     Bilateral   UPPER GASTROINTESTINAL ENDOSCOPY  09/13/2010    Family History: Family History  Problem Relation Age of Onset   Diabetes Mother    Hypertension Mother    Anxiety disorder Mother    Depression Mother    Drug abuse Mother    Heart disease Father    Diabetes Father    Anxiety disorder Father    Alcohol abuse Father    Depression Father    OCD Father    Thyroid disease Sister    Healthy Brother    Healthy Daughter    Healthy Son    ADD / ADHD Son    Alcohol abuse Paternal Grandfather    Depression Paternal Grandfather    Cancer Paternal Grandfather        lung,skin   Tuberculosis Paternal Grandfather    Seizures Paternal Grandmother    Lupus Paternal Grandmother    ADD / ADHD Son    Anesthesia problems Neg Hx    Malignant  hyperthermia Neg Hx    Pseudochol deficiency Neg Hx    Hypotension Neg Hx    Bipolar disorder Neg Hx    Dementia Neg Hx    Paranoid behavior Neg Hx    Schizophrenia Neg Hx    Sexual abuse Neg Hx    Physical abuse Neg Hx     Social History: Social History   Tobacco Use  Smoking Status Former   Current packs/day: 0.00   Average packs/day: 1.5 packs/day for 25.0 years (37.5 ttl pk-yrs)   Types: Cigarettes   Start date: 04/25/1988   Quit date: 04/25/2013   Years since quitting: 9.8   Passive exposure: Never  Smokeless Tobacco Never   Social History   Substance and Sexual Activity  Alcohol Use No   Social History   Substance  and Sexual Activity  Drug Use No    Allergies: Allergies  Allergen Reactions   Doxycycline Shortness Of Breath and Rash    Heart racing   Fentanyl Shortness Of Breath, Itching and Other (See Comments)    Heart racing, SOB   Cefoxitin     Unknown reaction   Ceftin [Cefuroxime Axetil]     Unknown reaction   Iodinated Contrast Media Other (See Comments)    Knots on body   Iohexol Other (See Comments)    Knots on body    Norvasc [Amlodipine Besylate]     Unknown reaction   Trintellix [Vortioxetine]     Tremors, heart race   Wellbutrin [Bupropion Hcl] Nausea And Vomiting   Latex Rash   Tape Rash    Medications: Current Outpatient Medications  Medication Sig Dispense Refill   ACCU-CHEK GUIDE test strip USE TO CHECK BLOOD SUGAR FOUR TIMES DAILY     Accu-Chek Softclix Lancets lancets CHECK SUGAR ONCE DAILY     amphetamine-dextroamphetamine (ADDERALL) 20 MG tablet Take 1 tablet (20 mg total) by mouth 2 (two) times daily. 60 tablet 0   atenolol (TENORMIN) 100 MG tablet Take 100 mg by mouth daily.     azaTHIOprine (IMURAN) 50 MG tablet Take 1 tablet (50 mg total) by mouth daily. 90 tablet 1   bimatoprost (LUMIGAN) 0.01 % SOLN Place 1 drop into both eyes at bedtime.     Calcium Citrate-Vitamin D3 (CALCIUM CITRATE +D) 315-6.25 MG-MCG TABS Take  1 tablet by mouth 2 (two) times daily.     cholecalciferol (VITAMIN D3) 25 MCG (1000 UNIT) tablet Take 2,000 Units by mouth daily.     cycloSPORINE (RESTASIS) 0.05 % ophthalmic emulsion Place 1 drop into both eyes 2 (two) times daily.     Dulaglutide (TRULICITY) 0.75 MG/0.5ML SOPN Inject 0.75 mg into the skin once a week. 2 mL 3   DULoxetine (CYMBALTA) 60 MG capsule TAKE 1 CAPSULE(60 MG) BY MOUTH TWICE DAILY 60 capsule 2   esomeprazole (NEXIUM) 40 MG capsule TAKE 1 CAPSULE(40 MG) BY MOUTH EVERY MORNING 90 capsule 1   FARXIGA 10 MG TABS tablet Take 10 mg by mouth daily.     fexofenadine (ALLEGRA) 180 MG tablet Take 180 mg by mouth daily.     gabapentin (NEURONTIN) 100 MG capsule Take 100 mg by mouth 3 (three) times daily.     HYDROcodone-acetaminophen (NORCO/VICODIN) 5-325 MG tablet Take 1 tablet by mouth 2 (two) times daily as needed for moderate pain. 0.5 -1 po bid prn     hydroxychloroquine (PLAQUENIL) 200 MG tablet TAKE 1 TABLET(200 MG) BY MOUTH TWICE DAILY 180 tablet 0   metFORMIN (GLUCOPHAGE) 1000 MG tablet Take 1 tablet (1,000 mg total) by mouth 2 (two) times daily with a meal. 60 tablet 3   methocarbamol (ROBAXIN) 500 MG tablet Take 500 mg by mouth 2 (two) times daily as needed for muscle spasms.     metoCLOPramide (REGLAN) 5 MG tablet Take 1 tablet (5 mg total) by mouth 3 (three) times daily before meals. 210 tablet 1   Multiple Vitamins-Minerals (CENTRUM) tablet Take 1 tablet by mouth daily.     nitroGLYCERIN (NITROLINGUAL) 0.4 MG/SPRAY spray Place 1 spray under the tongue every 5 (five) minutes x 3 doses as needed for chest pain.     NP THYROID 60 MG tablet TAKE 1 TABLET(60 MG) BY MOUTH DAILY BEFORE BREAKFAST (Patient taking differently: Take 60 mg by mouth daily before breakfast.) 30 tablet 3   Polyvinyl Alcohol-Povidone (  REFRESH OP) Place 1 drop into both eyes daily as needed (dry eyes).     prazosin (MINIPRESS) 2 MG capsule TAKE 1 CAPSULE(2 MG) BY MOUTH AT BEDTIME 30 capsule 2    PREVIDENT 5000 PLUS 1.1 % CREA dental cream Place 1 Application onto teeth every morning.     Probiotic Product (PROBIOTIC PO) Take 1 capsule by mouth daily.     propafenone (RYTHMOL SR) 225 MG 12 hr capsule Take 225 mg by mouth every 12 (twelve) hours.     REPATHA 140 MG/ML SOSY Inject 1 mL into the skin every 14 (fourteen) days.     rosuvastatin (CRESTOR) 40 MG tablet Take 40 mg by mouth daily.     spironolactone (ALDACTONE) 25 MG tablet Take 25 mg by mouth daily.     dicyclomine (BENTYL) 10 MG capsule Take 1 capsule (10 mg total) by mouth 2 (two) times daily as needed. (Patient not taking: Reported on 03/01/2023) 60 capsule 2   lisinopril (ZESTRIL) 40 MG tablet TAKE 1 TABLET(40 MG) BY MOUTH DAILY (Patient not taking: Reported on 03/01/2023) 90 tablet 0   PRESCRIPTION MEDICATION Take 10 mg by mouth 3 (three) times daily before meals. This is MOTILLIUM ( DOMPERIDONE 10 mg) Tablet (Patient not taking: Reported on 03/01/2023)     No current facility-administered medications for this visit.    Review of Systems: GENERAL: negative for malaise, night sweats HEENT: No changes in hearing or vision, no nose bleeds or other nasal problems. NECK: Negative for lumps, goiter, pain and significant neck swelling RESPIRATORY: Negative for cough, wheezing CARDIOVASCULAR: Negative for chest pain, leg swelling, palpitations, orthopnea GI: SEE HPI MUSCULOSKELETAL: Negative for joint pain or swelling, back pain, and muscle pain. SKIN: Negative for lesions, rash PSYCH: Negative for sleep disturbance, mood disorder and recent psychosocial stressors. HEMATOLOGY Negative for prolonged bleeding, bruising easily, and swollen nodes. ENDOCRINE: Negative for cold or heat intolerance, polyuria, polydipsia and goiter. NEURO: negative for tremor, gait imbalance, syncope and seizures. The remainder of the review of systems is noncontributory.   Physical Exam: BP 119/86 (BP Location: Left Arm, Patient Position: Sitting,  Cuff Size: Normal)   Pulse 79   Temp (!) 97.5 F (36.4 C) (Temporal)   Ht 5' (1.524 m)   Wt 191 lb 9.6 oz (86.9 kg)   BMI 37.42 kg/m  GENERAL: The patient is AO x3, in no acute distress. Obese. HEENT: Head is normocephalic and atraumatic. EOMI are intact. Mouth is well hydrated and without lesions. NECK: Supple. No masses LUNGS: Clear to auscultation. No presence of rhonchi/wheezing/rales. Adequate chest expansion HEART: RRR, normal s1 and s2. ABDOMEN: Soft, nontender, no guarding, no peritoneal signs, and nondistended. BS +. No masses. EXTREMITIES: Without any cyanosis, clubbing, rash, lesions or edema. NEUROLOGIC: AOx3, no focal motor deficit. SKIN: no jaundice, no rashes  Imaging/Labs: as above  I personally reviewed and interpreted the available labs, imaging and endoscopic files.  Impression and Plan: Lindsay Walsh is a 52 y.o. female with Pmh lupus, autoimmune hepatitis with stage II -III fibrosis, diabetic gastroparesis, GERD, depression, fibromyalgia, chronic opiate use, diabetes, hyperlipidemia, IBS, anxiety and obesity,  who presents for follow up of GERD, gastroparesis, IBS-D and autoimmune hepatitis.  In terms of her autoimmune hepatitis, the patient has achieved biochemical remission as her aminotransferases have been normal and her IgG is within normal limits.  She has been on low-dose azathioprine, which she should continue for now.  We will obtain surveillance labs today.  May consider repeating a FibroScan  in 1 year given her advanced fibrosis.  Regarding her episodes of diarrhea, this has started recently.  It is possible Reglan is contributing to some of her diarrhea but it is likely driven by her IBS-D.  Will check celiac disease panel to evaluate for other reasons for persistent diarrhea.  The patient has presented significant improvement with the use of Reglan for management of her gastroparesis.  She can try eating some portions multiple times during the day  and I advised her to try to take Reglan with her largest meals, she can decrease the use of the medication as much as possible.  Finally, her GERD seems to be uncontrolled at the moment but this worsened after she had to switch to over-the-counter medication.  Will refill prescription esomeprazole 40 mg every day.  -Check CBC, CMP, celiac disease panel and IgG -Continue azathioprine 50 mg qday -Continue Reglan 5 mg TID , but try to take it only with largest meals if possible -Eat small portions multiple times per day (5-6 times) -Follow up with [facility] -Restart esomeprazole 40 mg qday - Will repeat Fibroscan in one year  All questions were answered.      Lindsay Blazing, MD Gastroenterology and Hepatology [facility] Gastroenterology

## 2023-03-02 DIAGNOSIS — K7400 Hepatic fibrosis, unspecified: Secondary | ICD-10-CM | POA: Insufficient documentation

## 2023-03-02 NOTE — Telephone Encounter (Signed)
Pt notified on voicemail  °

## 2023-03-05 ENCOUNTER — Encounter (INDEPENDENT_AMBULATORY_CARE_PROVIDER_SITE_OTHER): Payer: Self-pay

## 2023-03-06 ENCOUNTER — Other Ambulatory Visit (HOSPITAL_COMMUNITY): Payer: Self-pay | Admitting: Adult Medicine

## 2023-03-06 ENCOUNTER — Telehealth (INDEPENDENT_AMBULATORY_CARE_PROVIDER_SITE_OTHER): Payer: Self-pay

## 2023-03-06 DIAGNOSIS — Z1231 Encounter for screening mammogram for malignant neoplasm of breast: Secondary | ICD-10-CM

## 2023-03-06 NOTE — Telephone Encounter (Signed)
Lindsay Walsh [address] [place], [place] [address] Hours of Operations: Address: 5 a.m. - 10 p.m. PT, Monday-Friday [address] 6 a.m. - 3 p.m. PT, Saturday [place], [place] [address] Date: 03/06/2023 To: [name] From: [facility] Phone: [phone] Phone: [phone] Fax: [phone] Reference #: [id number] RE: Prior Authorization Request Patient Name: Lindsay Walsh Patient DOB: April 01, 1971 Patient ID: [id number] Status of Request: Approve Medication Name: Burman Blacksmith Tab 550mg  GPI/NDC: [id number] Decision Notes: XIFAXAN TAB 550MG , use as directed, is approved through 03/20/2023 under your Medicare Part D benefit. Reviewed by: System If the treating physician would like to discuss this coverage decision with the physician or health care professional reviewer, please call [facility] Prior Authorization department at [phone].

## 2023-03-09 LAB — COMPREHENSIVE METABOLIC PANEL
ALT: 12 IU/L (ref 0–32)
AST: 18 IU/L (ref 0–40)
Albumin: 4.1 g/dL (ref 3.8–4.9)
Alkaline Phosphatase: 105 IU/L (ref 44–121)
BUN/Creatinine Ratio: 10 (ref 9–23)
BUN: 9 mg/dL (ref 6–24)
Bilirubin Total: 0.4 mg/dL (ref 0.0–1.2)
CO2: 25 mmol/L (ref 20–29)
Calcium: 9.4 mg/dL (ref 8.7–10.2)
Chloride: 103 mmol/L (ref 96–106)
Creatinine, Ser: 0.87 mg/dL (ref 0.57–1.00)
Globulin, Total: 2.3 g/dL (ref 1.5–4.5)
Glucose: 122 mg/dL — ABNORMAL HIGH (ref 70–99)
Potassium: 4.5 mmol/L (ref 3.5–5.2)
Sodium: 141 mmol/L (ref 134–144)
Total Protein: 6.4 g/dL (ref 6.0–8.5)
eGFR: 80 mL/min/{1.73_m2} (ref >59)

## 2023-03-09 LAB — CBC WITH DIFFERENTIAL/PLATELET
Basophils Absolute: 0.1 10*3/uL (ref 0.0–0.2)
Basos: 1 %
EOS (ABSOLUTE): 0.2 10*3/uL (ref 0.0–0.4)
Eos: 2 %
Hematocrit: 37.9 % (ref 34.0–46.6)
Hemoglobin: 12.5 g/dL (ref 11.1–15.9)
Immature Grans (Abs): 0 10*3/uL (ref 0.0–0.1)
Immature Granulocytes: 0 %
Lymphocytes Absolute: 1.2 10*3/uL (ref 0.7–3.1)
Lymphs: 16 %
MCH: 29.7 pg (ref 26.6–33.0)
MCHC: 33 g/dL (ref 31.5–35.7)
MCV: 90 fL (ref 79–97)
Monocytes Absolute: 0.7 10*3/uL (ref 0.1–0.9)
Monocytes: 10 %
Neutrophils Absolute: 5.5 10*3/uL (ref 1.4–7.0)
Neutrophils: 71 %
Platelets: 267 10*3/uL (ref 150–450)
RBC: 4.21 x10E6/uL (ref 3.77–5.28)
RDW: 12.5 % (ref 11.7–15.4)
WBC: 7.6 10*3/uL (ref 3.4–10.8)

## 2023-03-09 LAB — CELIAC DISEASE PANEL
Endomysial IgA: NEGATIVE
IgA/Immunoglobulin A, Serum: 200 mg/dL (ref 87–352)
Transglutaminase IgA: <2 U/mL (ref 0–3)

## 2023-03-09 LAB — IGG: IgG (Immunoglobin G), Serum: 982 mg/dL (ref 586–1602)

## 2023-03-11 ENCOUNTER — Ambulatory Visit
Admission: RE | Admit: 2023-03-11 | Discharge: 2023-03-11 | Disposition: A | Discharge status: Home / Self Care | Payer: 59 | Source: Ambulatory Visit | Attending: Neurosurgery | Admitting: Neurosurgery

## 2023-03-11 DIAGNOSIS — M5416 Radiculopathy, lumbar region: Secondary | ICD-10-CM

## 2023-03-15 NOTE — Progress Notes (Signed)
Office Visit Note  Patient: Lindsay Walsh             Date of Birth: Jun 19, 1971           MRN: [id number]             PCP: Eliezer Lofts, MD Referring: Eliezer Lofts, MD Visit Date: 03/28/2023   Subjective:  Follow-up   History of Present Illness: Lindsay Walsh is a 52 y.o. female here for follow up for seronegative inflammatory arthritis on hydroxychloroquine 400 mg daily also on azathioprine 50 mg daily for autoimmune hepatitis.  Overall feels like her arthritis problems are doing pretty well except with right knee pain.  This is aggravating more on some days especially like with weather change and is worse during movements requiring deep bending and getting up from low positions or climbing stairs.  Carpal tunnel symptoms are still doing better she is noticing some partial increase coming back recently.  She saw Dr. Franky Macho had MRI of the lumbar spine there was facet arthropathy noted and has follow-up scheduled may be pursuing local injections for this soon.   Previous HPI 09/25/2022 Lindsay Walsh is a 52 y.o. female here for follow up for seronegative inflammatory arthritis on hydroxychloroquine 400 mg daily.  Her hand and wrist symptoms have remained well improved after steroid injection in September.  However she is having more trouble with some joint pains predominantly on the left side.  Left shoulder is causing pain for months that is persistent she cannot raise it above horizontal without significant pain and frequently gets abrupt pain and awakens her from sleep if she rolls onto the left side at night. She had this evaluated including xray last month and was told it showed calcification. Referred to PT but has not been able to get any scheduled with her insurance so far. In the past 1 month is having more left hip pain on the side of the hip also gets worse with pressure on that area but also notices it just getting up and down from seated positions not as often while  walking.  She has had some persistent bruises on the right arm present for several months neither resolving nor expanding.  She also had heart catheterization with diagnoses of coronary artery disease started on Repatha.  She is scheduled for ophthalmology follow-up next month with [facility].   Previous HPI 03/20/22 Lindsay Walsh is a 52 y.o. female here for follow up for suspected seronegative arthritis with positive RNP Abs and autoimmune hepatitis. She started taking hydroxychloroquine 200 mg daily for this and feels her symptoms are overall somewhat improved. She still has numbness and joint pains in bilateral hands. Not seeing significant swelling and no rashes. Labs reviewed from 8/21 at [facility] including CBC and LFTs were normal.   Previous HPI 11/16/2021 Lindsay Walsh is a 52 y.o. female here for follow up for abnormal lab findings and continued ongoing joint pain in multiple areas.  After our last visit did not recommend any specific new anti-inflammatory medication.  She feels overall symptoms are doing worse compared to a year ago.  She has pain pretty much all over.  Some days she feels a sensitivity and pain other times feels like she has more numbness or diminished sensation throughout.  She is getting swelling in her hands and feet that is worse by the end of the day.  She keeps persistent fatigue usually regardless of her sleep quality.  Mixed constipation  and diarrhea symptoms.  Repeat laboratory testing by her pain management provider concerning for worsening lab markers for connective tissue disease or inflammation.  Liver function test been remaining stable on Imuran.   Previous HPI 06/16/2020 Lindsay Walsh is a 52 y.o. female here for follow up with hand pain and sometimes all over pain, positive RNP antibodies here for repeat assessment for inflammatory joint pain, nailfold capillaroscopy, and positive RNP test. She continues to have pain in multiple places including her hands,  knees, also neck and upper back. No signficant changes since her initial visit.   Review of Systems  Constitutional:  Positive for fatigue.  HENT:  Positive for mouth dryness. Negative for mouth sores.   Eyes:  Positive for dryness.  Respiratory:  Negative for shortness of breath.   Cardiovascular:  Positive for chest pain. Negative for palpitations.  Gastrointestinal:  Negative for blood in stool, constipation and diarrhea.  Endocrine: Negative for increased urination.  Genitourinary:  Negative for involuntary urination.  Musculoskeletal:  Positive for joint pain, joint pain, joint swelling, myalgias, muscle weakness, morning stiffness, muscle tenderness and myalgias. Negative for gait problem.  Skin:  Negative for color change, rash, hair loss and sensitivity to sunlight.  Allergic/Immunologic: Negative for susceptible to infections.  Neurological:  Positive for dizziness. Negative for headaches.  Hematological:  Negative for swollen glands.  Psychiatric/Behavioral:  Positive for depressed mood and sleep disturbance. The patient is not nervous/anxious.     PMFS History:  Patient Active Problem List   Diagnosis Date Noted   Pain in right knee 03/28/2023   Diabetes mellitus without complication (HCC) 03/27/2023   Liver fibrosis 03/02/2023   Pain in left shoulder 09/25/2022   Progressive angina (HCC)    Coronary artery calcification seen on CT scan    History of nuclear stress test    Abnormal nuclear stress test 04/11/2022   Atypical angina 04/11/2022   Bilateral carpal tunnel syndrome 04/05/2022   Positive ANA (antinuclear antibody) 11/16/2021   History of colonic polyps 11/15/2021   Bilateral hand pain 06/02/2020   OAB (overactive bladder) 11/05/2019   Recurrent UTI 11/05/2019   Diabetic gastroparesis (HCC) 08/21/2019   Diarrhea 07/15/2019   Abnormal finding on urinalysis 07/15/2019   Morbid (severe) obesity due to excess calories (HCC)    Major depressive disorder, single  episode, unspecified    Restless legs syndrome    Sleep apnea    Fibromyalgia    Hereditary and idiopathic neuropathy, unspecified    Nonspecific mesenteric lymphadenitis    Pain in right foot    Type 2 diabetes mellitus with hyperglycemia (HCC)    Hypothyroidism 08/22/2018   DM type 2 causing vascular disease (HCC) 08/21/2018   Hyperlipidemia associated with type 2 diabetes mellitus (HCC) 08/21/2018   Essential hypertension, benign 08/21/2018   Influenza A 08/10/2018   SIRS (systemic inflammatory response syndrome) (HCC) 08/10/2018   Cerebral infarction (HCC) 03/05/2015   Dyspareunia 05/06/2014   Obesity 09/30/2013   Bilateral hip pain 07/16/2013   Chronic pain syndrome 07/16/2013   Degenerative disc disease, lumbar 07/16/2013   Greater trochanteric bursitis of both hips 07/16/2013   Nightmares associated with chronic post-traumatic stress disorder 10/18/2012   Sprain of foot 03/14/2012   Weakness 01/29/2012   Irritable bowel syndrome with diarrhea 08/01/2011   Gastroesophageal reflux disease    Depression    Chest pain    Fasting hyperglycemia    TOBACCO ABUSE 04/28/2010   NARCOTIC ABUSE 04/28/2010   PULMONARY NODULE 04/28/2010  Autoimmune hepatitis (HCC) 04/28/2010   Myalgia and myositis 01/06/2009    Past Medical History:  Diagnosis Date   Acid reflux    Allergic rhinitis due to pollen    Anxiety    Arthritis    back   Autoimmune hepatitis (HCC)    Back pain    Connective tissue disease (HCC)    Depression    Dizziness and giddiness    Dyspareunia 05/06/2014   Dysrhythmia    palpatations; on propafenone   Essential (primary) hypertension    Fibromyalgia    Gastroesophageal reflux disease    Chronic abdominal pain; gastroparesis; globus hystericus; irritable bowel syndrome   Glaucoma    Hereditary and idiopathic neuropathy, unspecified    Hyperlipidemia    Irritable bowel syndrome    Major depressive disorder, single episode, unspecified    Morbid  (severe) obesity due to excess calories (HCC)    Narcotic dependence (HCC)    Nonspecific mesenteric lymphadenitis    Overweight(278.02)    Pain in limb    Pain in right foot    PTSD (post-traumatic stress disorder)    Pulmonary nodule    Restless legs syndrome    Sleep apnea    on CPAP machine   Stroke (HCC) 2016   Systemic lupus erythematosus (SLE) in adult [facility])    Tobacco abuse    one pack per day; 35 pack years; quit in February 2023   Type 2 diabetes mellitus with hyperglycemia (HCC)     Family History  Problem Relation Age of Onset   Diabetes Mother    Hypertension Mother    Anxiety disorder Mother    Depression Mother    Drug abuse Mother    Heart disease Father    Diabetes Father    Anxiety disorder Father    Alcohol abuse Father    Depression Father    OCD Father    Thyroid disease Sister    Healthy Brother    Healthy Daughter    Healthy Son    ADD / ADHD Son    Alcohol abuse Paternal Grandfather    Depression Paternal Grandfather    Cancer Paternal Grandfather        lung,skin   Tuberculosis Paternal Grandfather    Seizures Paternal Grandmother    Lupus Paternal Grandmother    ADD / ADHD Son    Anesthesia problems Neg Hx    Malignant hyperthermia Neg Hx    Pseudochol deficiency Neg Hx    Hypotension Neg Hx    Bipolar disorder Neg Hx    Dementia Neg Hx    Paranoid behavior Neg Hx    Schizophrenia Neg Hx    Sexual abuse Neg Hx    Physical abuse Neg Hx    Past Surgical History:  Procedure Laterality Date   ABDOMINAL HYSTERECTOMY     TAH&BSO   BACK SURGERY     BLADDER SUSPENSION  09/12/2011   Procedure: TRANSVAGINAL TAPE (TVT) PROCEDURE;  Surgeon: Ky Barban, MD;  Location: [facility];  Service: Urology;  Laterality: N/A;   CESAREAN SECTION     X3   CHOLECYSTECTOMY     CORONARY PRESSURE/FFR STUDY N/A 04/21/2022   Procedure: INTRAVASCULAR PRESSURE WIRE/FFR STUDY;  Surgeon: Marykay Lex, MD;  Location: [facility] INVASIVE CV LAB;  Service:  Cardiovascular;  Laterality: N/A;   ESOPHAGOGASTRODUODENOSCOPY ENDOSCOPY     "throat stretched" per pt.   LEFT HEART CATH AND CORONARY ANGIOGRAPHY N/A 04/21/2022   Procedure: LEFT HEART CATH AND  CORONARY ANGIOGRAPHY;  Surgeon: Marykay Lex, MD;  Location: [facility] INVASIVE CV LAB;  Service: Cardiovascular;  Laterality: N/A;   LIVER BIOPSY     x4   LUMBAR EPIDURAL INJECTION  01/2012   TOTAL ABDOMINAL HYSTERECTOMY W/ BILATERAL SALPINGOOPHORECTOMY     TUBAL LIGATION     Bilateral   UPPER GASTROINTESTINAL ENDOSCOPY  09/13/2010   Social History   Social History Narrative   Divorced since 1994.Lives with boyfriend of 11 years.On disability.   Immunization History  Administered Date(s) Administered   Hepatitis A, Adult 06/24/2020, 02/27/2022   Influenza Inj Mdck Quad Pf 04/20/2016   Influenza Split 04/07/2013, 05/17/2017   Influenza,inj,Quad PF,6+ Mos 05/17/2017, 04/04/2018, 03/31/2019, 04/28/2019   Influenza-Unspecified 04/17/2012, 03/20/2014, 08/09/2014, 05/06/2015, 06/24/2020   Moderna Sars-Covid-2 Vaccination 02/06/2020, 03/05/2020   Pneumococcal Conjugate-13 12/18/2019   Pneumococcal Polysaccharide-23 04/20/2016     Objective: Vital Signs: BP 124/79 (BP Location: Left Arm, Patient Position: Sitting, Cuff Size: Normal)   Pulse 73   Resp 13   Ht 5' (1.524 m)   Wt 194 lb 9.6 oz (88.3 kg)   BMI 38.01 kg/m    Physical Exam Constitutional:      Appearance: She is obese.  Cardiovascular:     Rate and Rhythm: Normal rate and regular rhythm.  Pulmonary:     Effort: Pulmonary effort is normal.     Breath sounds: Normal breath sounds.  Skin:    General: Skin is warm and dry.  Neurological:     Mental Status: She is alert.  Psychiatric:        Mood and Affect: Mood normal.      Musculoskeletal Exam:  Shoulders full ROM some pain with overhead abduction Elbows full ROM no tenderness or swelling Fingers full ROM no tenderness to pressure over the MCP joints of both hands but  no palpable swelling Left lateral hip tenderness to pressure and pain with internal rotation Right knee patellofemoral crepitus no significant joint line tenderness to pressure no palpable effusion   Investigation: No additional findings.  Imaging: XR KNEE 3 VIEW RIGHT  Result Date: 03/28/2023 X-ray right knee 3 views Medial and lateral compartment joint spaces appear slightly narrowed and increased sclerosis on tibial surface no lateral osteophytes.  Patellofemoral compartment appears normal there is a large superior patellar enthesophyte.  No abnormal calcifications seen.  No visible joint effusion. Impression Mild appearing osteoarthritis, large patellar enthesophyte suggestive for chronic quadriceps tendinitis or enthesitis  MR LUMBAR SPINE WO CONTRAST  Result Date: 03/28/2023 CLINICAL DATA:  52 year old female with persistent low back pain radiating to the both hips and legs. No known injury. EXAM: MRI LUMBAR SPINE WITHOUT CONTRAST TECHNIQUE: Multiplanar, multisequence MR imaging of the lumbar spine was performed. No intravenous contrast was administered. COMPARISON:  Lumbar MRI 03/03/2011.  Lumbar radiographs 08/08/2019. FINDINGS: Segmentation: Normal, the same numbering system used on the 2012 MRI. Alignment: Relatively normal lumbar lordosis. There is subtle anterolisthesis of L4 on L5 which is new since 2012. Vertebrae: Normal background bone marrow signal. Degenerative appearing faint marrow edema in the left L5 posterior elements, series 107, image 13. No other marrow edema or acute osseous abnormality. Intact visible sacrum and SI joints. Conus medullaris and cauda equina: Conus extends to the L1 level. No lower spinal cord or conus signal abnormality. Normal cauda equina nerve roots, and capacious spinal canal at most levels. Paraspinal and other soft tissues: Negative except for mild soft tissue inflammation adjacent to the L4-L5 facets. See additional details of that level  below. Disc  levels: Visible lower thoracic levels demonstrate mild disc bulging and facet hypertrophy without significant stenosis. T12-L1:  Negative. L1-L2:  Negative. L2-L3:  Negative. L3-L4: Negative disc. Mild to moderate facet hypertrophy. No stenosis. L4-L5: Subtle anterolisthesis. Minimal disc bulging. Moderate facet hypertrophy. Mild ligament flavum hypertrophy. No stenosis. L5-S1: Small central disc protrusion best demonstrated on series 105, image 9. Mild to moderate facet hypertrophy. Mild epidural lipomatosis. No stenosis. IMPRESSION: 1. Dominant degenerative finding is L4-L5 facet arthropathy, superimposed on subtle grade 1 Anterolisthesis there with some evidence of mild acute exacerbation of the chronic facet joint arthritis. No associated spinal stenosis or convincing neural impingement. But facet disease can be a source of low back pain which sometimes radiates. 2. Small central disc herniation at L5-S1 without convincing stenosis. Mild to moderate facet degeneration there and also at L3-L4. Electronically Signed   By: Odessa Fleming M.D.   On: 03/28/2023 07:40   MM 3D SCREENING MAMMOGRAM BILATERAL BREAST  Result Date: 03/21/2023 CLINICAL DATA:  Screening. EXAM: DIGITAL SCREENING BILATERAL MAMMOGRAM WITH TOMOSYNTHESIS AND CAD TECHNIQUE: Bilateral screening digital craniocaudal and mediolateral oblique mammograms were obtained. Bilateral screening digital breast tomosynthesis was performed. The images were evaluated with computer-aided detection. COMPARISON:  Previous exam(s). ACR Breast Density Category a: The breasts are almost entirely fatty. FINDINGS: There are no findings suspicious for malignancy. IMPRESSION: No mammographic evidence of malignancy. A result letter of this screening mammogram will be mailed directly to the patient. RECOMMENDATION: Screening mammogram in one year. (Code:SM-B-01Y) BI-RADS CATEGORY  1: Negative. Electronically Signed   By: Harmon Pier M.D.   On: 03/21/2023 09:48    Recent  Labs: Lab Results  Component Value Date   WBC 7.6 03/06/2023   HGB 12.5 03/06/2023   PLT 267 03/06/2023   NA 141 03/06/2023   K 4.5 03/06/2023   CL 103 03/06/2023   CO2 25 03/06/2023   GLUCOSE 122 (H) 03/06/2023   BUN 9 03/06/2023   CREATININE 0.87 03/06/2023   BILITOT 0.4 03/06/2023   ALKPHOS 105 03/06/2023   AST 18 03/06/2023   ALT 12 03/06/2023   PROT 6.4 03/06/2023   ALBUMIN 4.1 03/06/2023   CALCIUM 9.4 03/06/2023   GFRAA 101 08/03/2020    Speciality Comments: PLQ Eye Exam 11/14/2022 normal Dr. Dione Booze f/u 6 months  Procedures:  No procedures performed Allergies: Doxycycline, Fentanyl, Cefoxitin, Ceftin [cefuroxime axetil], Iodinated contrast media, Iohexol, Norvasc [amlodipine besylate], Trintellix [vortioxetine], Wellbutrin [bupropion hcl], Latex, and Tape   Assessment / Plan:     Visit Diagnoses: Positive ANA (antinuclear antibody) Autoimmune hepatitis (HCC) - Plan: Sedimentation rate, C-reactive protein, RNP Antibody  Inflammatory disease appears well-controlled we will recheck the sed rate CRP also RNP antibody titer.  Plan to continue hydroxychloroquine 400 mg daily and continues azathioprine 50 mg daily.  High risk medication use - HCQ 400 mg daily. PLQ Eye Exam 11/14/2022 normal  Bilateral carpal tunnel syndrome  Remains mostly improved nothing provoked on exam maneuvers today.  Chronic pain of right knee - Plan: XR KNEE 3 VIEW RIGHT  Right knee pain is acting up out of proportion to other joints.  No inflammatory changes on exam.  X-ray of the knee checked shows some osteoarthritis though the most prominent finding are patellar enthesophytes.  Discussed this will usually need treatment involving exercise and could benefit with physical therapy.  For now provided printed knee exercises and recommendation for topical therapies.  Will not try any steroid injection for this today as she has upcoming  appointment with neurosurgery clinic that may be recommending injection  treatment for back pain which is her more limiting symptom.  Orders: Orders Placed This Encounter  Procedures   XR KNEE 3 VIEW RIGHT   Sedimentation rate   C-reactive protein   RNP Antibody   No orders of the defined types were placed in this encounter.    Follow-Up Instructions: Return in about 3 months (around 06/27/2023).   Fuller Plan, MD  Note - This record has been created using [employer].  Chart creation errors have been sought, but may not always  have been located. Such creation errors do not reflect on  the standard of medical care.

## 2023-03-19 ENCOUNTER — Ambulatory Visit (HOSPITAL_COMMUNITY)
Admission: RE | Admit: 2023-03-19 | Discharge: 2023-03-19 | Disposition: A | Discharge status: Home / Self Care | Payer: 59 | Source: Ambulatory Visit | Attending: Adult Medicine | Admitting: Adult Medicine

## 2023-03-19 ENCOUNTER — Encounter (HOSPITAL_COMMUNITY): Payer: Self-pay

## 2023-03-19 DIAGNOSIS — Z1231 Encounter for screening mammogram for malignant neoplasm of breast: Secondary | ICD-10-CM | POA: Diagnosis present

## 2023-03-21 ENCOUNTER — Ambulatory Visit (HOSPITAL_COMMUNITY): Payer: 59 | Admitting: Psychiatry

## 2023-03-24 ENCOUNTER — Other Ambulatory Visit (INDEPENDENT_AMBULATORY_CARE_PROVIDER_SITE_OTHER): Payer: Self-pay | Admitting: Gastroenterology

## 2023-03-24 DIAGNOSIS — K580 Irritable bowel syndrome with diarrhea: Secondary | ICD-10-CM

## 2023-03-26 NOTE — Telephone Encounter (Signed)
Patient had this prescribed to her in August and it is a one time rx

## 2023-03-27 ENCOUNTER — Ambulatory Visit (INDEPENDENT_AMBULATORY_CARE_PROVIDER_SITE_OTHER): Payer: 59 | Admitting: Podiatry

## 2023-03-27 ENCOUNTER — Encounter: Payer: Self-pay | Admitting: Podiatry

## 2023-03-27 DIAGNOSIS — E119 Type 2 diabetes mellitus without complications: Secondary | ICD-10-CM | POA: Diagnosis not present

## 2023-03-27 NOTE — Progress Notes (Signed)
This patient presents to the office for diabetic foot exam. This patient says there is no pain or discomfort in her feet.  No history of infection or drainage.  This patient presents to the office for foot exam due to having a history of diabetes.  Vascular  Dorsalis pedis and posterior tibial pulses are palpable  B/L.  Capillary return  WNL.  Temperature gradient is  WNL.  Skin turgor  WNL  Sensorium  Senn Weinstein monofilament wire  WNL. Normal tactile sensation.  Nail Exam  Patient has normal nails with no evidence of bacterial or fungal infection.  Orthopedic  Exam  Muscle tone and muscle strength  WNL.  No limitations of motion feet  B/L.  No crepitus or joint effusion noted.  Foot type is unremarkable and digits show no abnormalities. Tailors bunion 5th MPJ right foot.  Skin  No open lesions.  Normal skin texture and turgor.   Diabetes with no complications  Diabetic foot exam was performed.  There is no evidence of vascular or neurologic pathology.  RTC  1 year.   Helane Gunther DPM

## 2023-03-28 ENCOUNTER — Ambulatory Visit: Payer: 59 | Attending: Internal Medicine | Admitting: Internal Medicine

## 2023-03-28 ENCOUNTER — Ambulatory Visit: Payer: 59

## 2023-03-28 ENCOUNTER — Encounter: Payer: Self-pay | Admitting: Internal Medicine

## 2023-03-28 VITALS — BP 124/79 | HR 73 | Resp 13 | Ht 60.0 in | Wt 194.6 lb

## 2023-03-28 DIAGNOSIS — M25512 Pain in left shoulder: Secondary | ICD-10-CM | POA: Diagnosis not present

## 2023-03-28 DIAGNOSIS — K754 Autoimmune hepatitis: Secondary | ICD-10-CM

## 2023-03-28 DIAGNOSIS — R768 Other specified abnormal immunological findings in serum: Secondary | ICD-10-CM | POA: Diagnosis not present

## 2023-03-28 DIAGNOSIS — G5603 Carpal tunnel syndrome, bilateral upper limbs: Secondary | ICD-10-CM

## 2023-03-28 DIAGNOSIS — M25561 Pain in right knee: Secondary | ICD-10-CM | POA: Diagnosis not present

## 2023-03-28 DIAGNOSIS — G8929 Other chronic pain: Secondary | ICD-10-CM

## 2023-03-28 DIAGNOSIS — Z79899 Other long term (current) drug therapy: Secondary | ICD-10-CM | POA: Diagnosis not present

## 2023-03-28 NOTE — Patient Instructions (Signed)
For osteoarthritis several treatments may be beneficial:  - Topical antiinflammatory medicine such as diclofenac (Voltaren) can be applied to affected area as needed.  - A compressive knee sleeve can be helpful to support the joint especially if hurting or swelling with certain activities. These are available at most pharmacy, supermarkets, or medical supply store.  - Physical therapy referral can discuss exercises or activity modification to improve symptoms or strength if needed.  - Local steroid injection is an option if symptoms become worse and not controlled by the above options.

## 2023-03-29 LAB — RNP ANTIBODY: Ribonucleic Protein(ENA) Antibody, IgG: 2.1 AI — AB

## 2023-03-29 LAB — SEDIMENTATION RATE: Sed Rate: 9 mm/h (ref 0–30)

## 2023-03-29 LAB — C-REACTIVE PROTEIN: CRP: <3 mg/L (ref <8.0)

## 2023-03-30 NOTE — Progress Notes (Signed)
Sed rate and CRP remain normal.  The RNP antibody is still positive but at very low level 2.1 this is about the same as 6 months ago.

## 2023-04-09 ENCOUNTER — Ambulatory Visit (INDEPENDENT_AMBULATORY_CARE_PROVIDER_SITE_OTHER): Payer: 59 | Admitting: Clinical

## 2023-04-09 DIAGNOSIS — F900 Attention-deficit hyperactivity disorder, predominantly inattentive type: Secondary | ICD-10-CM | POA: Diagnosis not present

## 2023-04-09 DIAGNOSIS — F331 Major depressive disorder, recurrent, moderate: Secondary | ICD-10-CM | POA: Diagnosis not present

## 2023-04-09 DIAGNOSIS — F419 Anxiety disorder, unspecified: Secondary | ICD-10-CM

## 2023-04-09 DIAGNOSIS — F4310 Post-traumatic stress disorder, unspecified: Secondary | ICD-10-CM | POA: Diagnosis not present

## 2023-04-09 NOTE — Progress Notes (Addendum)
Virtual Visit via Video Note  I connected with Lindsay Walsh on 04/09/23 at  3:00 PM EDT by a video enabled telemedicine application and verified that I am speaking with the correct person using two identifiers.  Location: Patient: home Provider: office   I discussed the limitations of evaluation and management by telemedicine and the availability of in person appointments. The patient expressed understanding and agreed to proceed.    THERAPIST PROGRESS NOTE   Session Time: 3:00 PM-3:30 PM   Participation Level: Active   Behavioral Response: CasualAlertDepressed   Type of Therapy: Individual Therapy   Treatment Goals addressed: Coping for MH diagnoses   Interventions: CBT   Summary: Lindsay Walsh is a 52 y.o. female who presents with Depression, ADHD, PTSD  . The OPT therapist worked with the patient for her ongoing OPT treatment session. The OPT therapist utilized Motivational Interviewing to assist in creating therapeutic repore. The patient in the session was engaged and work in collaboration giving feedback about her triggers and symptoms over the past few weeks. The patient spoke about working to implement coping taking walks, going on [employer], socializing, and community outings. The patient identified difficulty with health and will be following up with her PCP, psychiatrist, and Cardiologist.The patient identified primary physical health difficulty with back pain and will be following up after recently doing imaging to see what the treatment moving forward will be based on her health experts feedback. The OPT therapist utilized Cognitive Behavioral Therapy through cognitive restructuring as well as worked with the patient on coping strategies to assist in management of mood. The patient identified interactions with certain family members as triggers and her realization that she had to work on limiting her interaction and her reactive behavior using coping when needed such as  walking away or getting off the phone to help her not get upset. The patient spoke about her intent to try to be more positive mindset and not Catastrophisize .    Suicidal/Homicidal: Nowithout intent/plan   Therapist Response: The OPT therapist worked with the patient for the patients scheduled session. The patient was engaged in her session and gave feedback in relation to triggers, symptoms, and behavior responses over the past few weeks. The OPT therapist worked with the patient utilizing an in session Cognitive Behavioral Therapy exercise. The patient was responsive in the session and verbalized," I know I have to stop talking myself out of being more active and I have to get out of this house more often". The OPT therapist worked with the patient on placing her energy/focus/and attention in areas of her life she has control over and doing her self check ins throughout the week. The OPT therapist promoted the patient being active within her physical health limitations and change environment getting outside the home more .The OPT therapist will continue treatment work with the patient in her next scheduled session   Plan: Return again in 3 weeks.   Diagnosis       Axis I: Recurrent Moderate MDD with Anxiety, ADHD inattentive type, PTSD.                             Axis II: No diagnosis     Collaboration of Care: No additional collaboration of care for this session.   Patient/Guardian was advised Release of Information must be obtained prior to any record release in order to collaborate their care with an outside provider. Patient/Guardian was  advised if they have not already done so to contact the registration department to sign all necessary forms in order for [place] to release information regarding their care.    Consent: Patient/Guardian gives verbal consent for treatment and assignment of benefits for services provided during this visit. Patient/Guardian expressed understanding and agreed to  proceed.    I discussed the assessment and treatment plan with the patient. The patient was provided an opportunity to ask questions and all were answered. The patient agreed with the plan and demonstrated an understanding of the instructions.   The patient was advised to call back or seek an in-person evaluation if the symptoms worsen or if the condition fails to improve as anticipated.   I provided 30 minutes of non-face-to-face time during this encounter.   Winfred Burn, LCSW   04/09/2023

## 2023-04-23 ENCOUNTER — Ambulatory Visit (INDEPENDENT_AMBULATORY_CARE_PROVIDER_SITE_OTHER): Payer: 59 | Admitting: Psychiatry

## 2023-04-23 ENCOUNTER — Encounter (HOSPITAL_COMMUNITY): Payer: Self-pay | Admitting: Psychiatry

## 2023-04-23 VITALS — BP 138/88 | HR 84 | Temp 97.1°F | Ht 60.0 in | Wt 195.0 lb

## 2023-04-23 DIAGNOSIS — F4310 Post-traumatic stress disorder, unspecified: Secondary | ICD-10-CM | POA: Diagnosis not present

## 2023-04-23 DIAGNOSIS — F900 Attention-deficit hyperactivity disorder, predominantly inattentive type: Secondary | ICD-10-CM

## 2023-04-23 DIAGNOSIS — F4312 Post-traumatic stress disorder, chronic: Secondary | ICD-10-CM

## 2023-04-23 DIAGNOSIS — F419 Anxiety disorder, unspecified: Secondary | ICD-10-CM

## 2023-04-23 DIAGNOSIS — F331 Major depressive disorder, recurrent, moderate: Secondary | ICD-10-CM

## 2023-04-23 DIAGNOSIS — F515 Nightmare disorder: Secondary | ICD-10-CM | POA: Diagnosis not present

## 2023-04-23 MED ORDER — AMPHETAMINE-DEXTROAMPHETAMINE 20 MG PO TABS: 20.0000 mg | ORAL_TABLET | Freq: Two times a day (BID) | ORAL | 0 refills | Status: DC

## 2023-04-23 MED ORDER — PAROXETINE HCL 40 MG PO TABS: 40.0000 mg | ORAL_TABLET | Freq: Every day | ORAL | 2 refills | Status: DC

## 2023-04-23 MED ORDER — PRAZOSIN HCL 2 MG PO CAPS: ORAL_CAPSULE | ORAL | 2 refills | Status: DC

## 2023-04-23 NOTE — Progress Notes (Signed)
BH MD/PA/NP OP Progress Note  04/23/2023 3:46 PM Lindsay Walsh  MRN:  [id number]  Chief Complaint:  Chief Complaint  Patient presents with   Anxiety   Depression   Follow-up   HPI: This patient is a 52 year old divorced white female who lives with her boyfriend in [place]. She is on disability for an autoimmune liver disease. She has 3 children and one granddaughter  The patient returns for follow-up after about 5 months regarding depression anxiety PTSD and ADD.  She states over the last couple months she has been feeling a lot more depressed and anxious.  She is not entirely sure why.  She does not have the energy or motivation to get up and do anything.  She does have autoimmune hepatitis and lupus as well as chronic back pain and these things seem to play a role in this.  She feels discouraged with herself and even though she wants to do things like even get out on the porch her body just does not feel like moving.  Her sleep is somewhat variable.  At times she is angry and irritable at other times just sad and tearful.  She denies any thoughts of self-harm or suicide today.  She is in therapy.  She feels like the Cymbalta her antidepressant is no longer working for her.  She had tried Prozac in the past which caused nausea.  Because she is having significant anxiety suggested perhaps a switch to paroxetine and she is willing to try it.  The Adderall is about the only thing that helps her energy.   Visit Diagnosis:    ICD-10-CM   1. Recurrent moderate major depressive disorder with anxiety (HCC)  F33.1    F41.9     2. Nightmares associated with chronic post-traumatic stress disorder  F51.5 prazosin (MINIPRESS) 2 MG capsule   F43.12     3. PTSD (post-traumatic stress disorder)  F43.10     4. Attention deficit hyperactivity disorder (ADHD), predominantly inattentive type  F90.0       Past Psychiatric History: Hospitalization in her early 47s for depression  Past Medical  History:  Past Medical History:  Diagnosis Date   Acid reflux    Allergic rhinitis due to pollen    Anxiety    Arthritis    back   Autoimmune hepatitis (HCC)    Back pain    Connective tissue disease (HCC)    Depression    Dizziness and giddiness    Dyspareunia 05/06/2014   Dysrhythmia    palpatations; on propafenone   Essential (primary) hypertension    Fibromyalgia    Gastroesophageal reflux disease    Chronic abdominal pain; gastroparesis; globus hystericus; irritable bowel syndrome   Glaucoma    Hereditary and idiopathic neuropathy, unspecified    Hyperlipidemia    Irritable bowel syndrome    Major depressive disorder, single episode, unspecified    Morbid (severe) obesity due to excess calories (HCC)    Narcotic dependence (HCC)    Nonspecific mesenteric lymphadenitis    Overweight(278.02)    Pain in limb    Pain in right foot    PTSD (post-traumatic stress disorder)    Pulmonary nodule    Restless legs syndrome    Sleep apnea    on CPAP machine   Stroke (HCC) 2016   Systemic lupus erythematosus (SLE) in adult [facility])    Tobacco abuse    one pack per day; 35 pack years; quit in February 2023   Type  2 diabetes mellitus with hyperglycemia (HCC)     Past Surgical History:  Procedure Laterality Date   ABDOMINAL HYSTERECTOMY     TAH&BSO   BACK SURGERY     BLADDER SUSPENSION  09/12/2011   Procedure: TRANSVAGINAL TAPE (TVT) PROCEDURE;  Surgeon: Ky Barban, MD;  Location: [facility];  Service: Urology;  Laterality: N/A;   CESAREAN SECTION     X3   CHOLECYSTECTOMY     CORONARY PRESSURE/FFR STUDY N/A 04/21/2022   Procedure: INTRAVASCULAR PRESSURE WIRE/FFR STUDY;  Surgeon: Marykay Lex, MD;  Location: [facility] INVASIVE CV LAB;  Service: Cardiovascular;  Laterality: N/A;   ESOPHAGOGASTRODUODENOSCOPY ENDOSCOPY     "throat stretched" per pt.   LEFT HEART CATH AND CORONARY ANGIOGRAPHY N/A 04/21/2022   Procedure: LEFT HEART CATH AND CORONARY ANGIOGRAPHY;  Surgeon: Marykay Lex, MD;  Location: [facility] INVASIVE CV LAB;  Service: Cardiovascular;  Laterality: N/A;   LIVER BIOPSY     x4   LUMBAR EPIDURAL INJECTION  01/2012   TOTAL ABDOMINAL HYSTERECTOMY W/ BILATERAL SALPINGOOPHORECTOMY     TUBAL LIGATION     Bilateral   UPPER GASTROINTESTINAL ENDOSCOPY  09/13/2010    Family Psychiatric History: See below  Family History:  Family History  Problem Relation Age of Onset   Diabetes Mother    Hypertension Mother    Anxiety disorder Mother    Depression Mother    Drug abuse Mother    Heart disease Father    Diabetes Father    Anxiety disorder Father    Alcohol abuse Father    Depression Father    OCD Father    Thyroid disease Sister    Healthy Brother    Healthy Daughter    Healthy Son    ADD / ADHD Son    Alcohol abuse Paternal Grandfather    Depression Paternal Grandfather    Cancer Paternal Grandfather        lung,skin   Tuberculosis Paternal Grandfather    Seizures Paternal Grandmother    Lupus Paternal Grandmother    ADD / ADHD Son    Anesthesia problems Neg Hx    Malignant hyperthermia Neg Hx    Pseudochol deficiency Neg Hx    Hypotension Neg Hx    Bipolar disorder Neg Hx    Dementia Neg Hx    Paranoid behavior Neg Hx    Schizophrenia Neg Hx    Sexual abuse Neg Hx    Physical abuse Neg Hx     Social History:  Social History   Socioeconomic History   Marital status: Divorced    Spouse name: Not on file   Number of children: 3   Years of education: GED   Highest education level: Not on file  Occupational History    Employer: NOT EMPLOYED  Tobacco Use   Smoking status: Former    Current packs/day: 0.00    Average packs/day: 1.5 packs/day for 25.0 years (37.5 ttl pk-yrs)    Types: Cigarettes    Start date: 04/25/1988    Quit date: 04/25/2013    Years since quitting: 10.0    Passive exposure: Never   Smokeless tobacco: Never  Vaping Use   Vaping status: Every Day   Start date: 04/22/2013   Substances: Nicotine, Flavoring   Substance and Sexual Activity   Alcohol use: No   Drug use: No   Sexual activity: Yes    Birth control/protection: Surgical, Abstinence  Other Topics Concern   Not on file  Social History  Narrative   Divorced since 56.Lives with boyfriend of 11 years.On disability.   Social Determinants of Health   Financial Resource Strain: Not on file  Food Insecurity: Not on file  Transportation Needs: Not on file  Physical Activity: Not on file  Stress: Not on file  Social Connections: Not on file    Allergies:  Allergies  Allergen Reactions   Doxycycline Shortness Of Breath and Rash    Heart racing   Fentanyl Shortness Of Breath, Itching and Other (See Comments)    Heart racing, SOB   Cefoxitin     Unknown reaction   Ceftin [Cefuroxime Axetil]     Unknown reaction   Iodinated Contrast Media Other (See Comments)    Knots on body   Iohexol Other (See Comments)    Knots on body    Norvasc [Amlodipine Besylate]     Unknown reaction   Trintellix [Vortioxetine]     Tremors, heart race   Wellbutrin [Bupropion Hcl] Nausea And Vomiting   Latex Rash   Tape Rash    Metabolic Disorder Labs: Lab Results  Component Value Date   HGBA1C 11.4 (H) 08/03/2020   MPG 280 08/03/2020   MPG 237.43 08/11/2018   No results found for: "PROLACTIN" Lab Results  Component Value Date   CHOL 153 04/25/2019   TRIG 141 04/25/2019   HDL 37 04/25/2019   CHOLHDL 4.9 03/06/2015   VLDL 16 03/06/2015   LDLCALC 91 04/25/2019   LDLCALC 104 (H) 03/06/2015   Lab Results  Component Value Date   TSH 4.616 (H) 05/23/2022   TSH 2.79 08/03/2020    Therapeutic Level Labs: No results found for: "LITHIUM" No results found for: "VALPROATE" No results found for: "CBMZ"  Current Medications: Current Outpatient Medications  Medication Sig Dispense Refill   ACCU-CHEK GUIDE test strip USE TO CHECK BLOOD SUGAR FOUR TIMES DAILY     Accu-Chek Softclix Lancets lancets CHECK SUGAR ONCE DAILY     atenolol  (TENORMIN) 100 MG tablet Take 100 mg by mouth daily.     azaTHIOprine (IMURAN) 50 MG tablet Take 1 tablet (50 mg total) by mouth daily. 90 tablet 1   bimatoprost (LUMIGAN) 0.01 % SOLN Place 1 drop into both eyes at bedtime.     Calcium Citrate-Vitamin D3 (CALCIUM CITRATE +D) 315-6.25 MG-MCG TABS Take 1 tablet by mouth 2 (two) times daily.     cholecalciferol (VITAMIN D3) 25 MCG (1000 UNIT) tablet Take 2,000 Units by mouth daily.     cycloSPORINE (RESTASIS) 0.05 % ophthalmic emulsion Place 1 drop into both eyes 2 (two) times daily.     dicyclomine (BENTYL) 10 MG capsule Take 1 capsule (10 mg total) by mouth 2 (two) times daily as needed. 60 capsule 2   Dulaglutide (TRULICITY) 0.75 MG/0.5ML SOPN Inject 0.75 mg into the skin once a week. 2 mL 3   esomeprazole (NEXIUM) 40 MG capsule Take 1 capsule (40 mg total) by mouth daily at 12 noon. 90 capsule 3   FARXIGA 10 MG TABS tablet Take 10 mg by mouth daily.     fexofenadine (ALLEGRA) 180 MG tablet Take 180 mg by mouth daily.     gabapentin (NEURONTIN) 100 MG capsule Take 100 mg by mouth 3 (three) times daily.     HYDROcodone-acetaminophen (NORCO/VICODIN) 5-325 MG tablet Take 1 tablet by mouth 2 (two) times daily as needed for moderate pain. 0.5 -1 po bid prn     hydroxychloroquine (PLAQUENIL) 200 MG tablet TAKE 1 TABLET(200 MG) BY  MOUTH TWICE DAILY 180 tablet 0   metFORMIN (GLUCOPHAGE) 1000 MG tablet Take 1 tablet (1,000 mg total) by mouth 2 (two) times daily with a meal. 60 tablet 3   methocarbamol (ROBAXIN) 500 MG tablet Take 500 mg by mouth 2 (two) times daily as needed for muscle spasms.     metoCLOPramide (REGLAN) 5 MG tablet Take 1 tablet (5 mg total) by mouth 3 (three) times daily before meals. 210 tablet 3   Multiple Vitamins-Minerals (CENTRUM) tablet Take 1 tablet by mouth daily.     nitroGLYCERIN (NITROLINGUAL) 0.4 MG/SPRAY spray Place 1 spray under the tongue every 5 (five) minutes x 3 doses as needed for chest pain.     NP THYROID 60 MG  tablet TAKE 1 TABLET(60 MG) BY MOUTH DAILY BEFORE BREAKFAST (Patient taking differently: Take 60 mg by mouth daily before breakfast.) 30 tablet 3   PARoxetine (PAXIL) 40 MG tablet Take 1 tablet (40 mg total) by mouth at bedtime. 30 tablet 2   Polyvinyl Alcohol-Povidone (REFRESH OP) Place 1 drop into both eyes daily as needed (dry eyes).     PRESCRIPTION MEDICATION Take 10 mg by mouth 3 (three) times daily before meals. This is MOTILLIUM ( DOMPERIDONE 10 mg) Tablet     PREVIDENT 5000 PLUS 1.1 % CREA dental cream Place 1 Application onto teeth every morning.     Probiotic Product (PROBIOTIC PO) Take 1 capsule by mouth daily.     propafenone (RYTHMOL SR) 225 MG 12 hr capsule Take 225 mg by mouth every 12 (twelve) hours.     REPATHA 140 MG/ML SOSY Inject 1 mL into the skin every 14 (fourteen) days.     rosuvastatin (CRESTOR) 40 MG tablet Take 40 mg by mouth daily.     spironolactone (ALDACTONE) 25 MG tablet Take 25 mg by mouth daily.     amphetamine-dextroamphetamine (ADDERALL) 20 MG tablet Take 1 tablet (20 mg total) by mouth 2 (two) times daily. 60 tablet 0   lisinopril (ZESTRIL) 40 MG tablet TAKE 1 TABLET(40 MG) BY MOUTH DAILY (Patient not taking: Reported on 04/23/2023) 90 tablet 0   prazosin (MINIPRESS) 2 MG capsule TAKE 1 CAPSULE(2 MG) BY MOUTH AT BEDTIME 30 capsule 2   No current facility-administered medications for this visit.     Musculoskeletal: Strength & Muscle Tone: within normal limits Gait & Station: normal Patient leans: N/A  Psychiatric Specialty Exam: Review of Systems  Constitutional:  Positive for fatigue.  Musculoskeletal:  Positive for back pain.  Psychiatric/Behavioral:  Positive for decreased concentration and dysphoric mood. The patient is nervous/anxious.   All other systems reviewed and are negative.   Blood pressure 138/88, pulse 84, temperature (!) 97.1 F (36.2 C), temperature source Temporal, height 5' (1.524 m), weight 195 lb (88.5 kg), SpO2 95%.Body mass  index is 38.08 kg/m.  General Appearance: Casual, Neat, and Well Groomed  Eye Contact:  Good  Speech:  Clear and Coherent  Volume:  Normal  Mood:  Anxious and Depressed  Affect:  Depressed  Thought Process:  Goal Directed  Orientation:  Full (Time, Place, and Person)  Thought Content: Rumination   Suicidal Thoughts:  No  Homicidal Thoughts:  No  Memory:  Immediate;   Good Recent;   Good Remote;   Fair  Judgement:  Good  Insight:  Fair  Psychomotor Activity:  Decreased  Concentration:  Concentration: Fair and Attention Span: Fair  Recall:  Good  Fund of Knowledge: Good  Language: Good  Akathisia:  No  Handed:  Right  AIMS (if indicated): not done  Assets:  Communication Skills Desire for Improvement Resilience Social Support  ADL's:  Intact  Cognition: WNL  Sleep:  Fair   Screenings: GAD-7    Garment/textile technologist Visit from 04/23/2023 in [facility] [place] Counselor from 09/04/2022 in [facility]  Total GAD-7 Score 16 19      PHQ2-9    Flowsheet Row Office Visit from 04/23/2023 in [facility] [place] Counselor from 09/04/2022 in [facility] at [place] Video Visit from 02/08/2022 in [facility] at [place] Video Visit from 11/09/2021 in [facility] at [place] Video Visit from 08/12/2021 in [facility] at [facility] Total Score 6 6 2  0 1  PHQ-9 Total Score 19 17 7  -- --      Flowsheet Row Office Visit from 04/23/2023 in [facility] at [place] ED from 10/27/2022 in [facility] at [facility] Counselor from 09/04/2022 in [facility] at [place]  C-SSRS RISK CATEGORY No Risk No Risk No Risk        Assessment and Plan: This patient is a  52 year old female with a history of PTSD nightmares depression anxiety and ADHD.  She does not seem to be doing as well recently and has become more depressed and anxious.  We will taper off Cymbalta and start paroxetine 40 mg daily for depression and anxiety, continue prazosin 20 mg at bedtime for nightmares and Adderall 20 mg twice daily for ADHD.  She will return to see me in 4 weeks  Collaboration of Care: Collaboration of Care: Referral or follow-up with counselor/therapist AEB patient will continue therapy with Suzan Garibaldi in our office  Patient/Guardian was advised Release of Information must be obtained prior to any record release in order to collaborate their care with an outside provider. Patient/Guardian was advised if they have not already done so to contact the registration department to sign all necessary forms in order for [place] to release information regarding their care.   Consent: Patient/Guardian gives verbal consent for treatment and assignment of benefits for services provided during this visit. Patient/Guardian expressed understanding and agreed to proceed.    Diannia Ruder, MD 04/23/2023, 3:46 PM

## 2023-05-07 ENCOUNTER — Ambulatory Visit (INDEPENDENT_AMBULATORY_CARE_PROVIDER_SITE_OTHER): Payer: 59 | Admitting: Clinical

## 2023-05-07 DIAGNOSIS — F900 Attention-deficit hyperactivity disorder, predominantly inattentive type: Secondary | ICD-10-CM | POA: Diagnosis not present

## 2023-05-07 DIAGNOSIS — F331 Major depressive disorder, recurrent, moderate: Secondary | ICD-10-CM

## 2023-05-07 DIAGNOSIS — F419 Anxiety disorder, unspecified: Secondary | ICD-10-CM | POA: Diagnosis not present

## 2023-05-07 DIAGNOSIS — F4310 Post-traumatic stress disorder, unspecified: Secondary | ICD-10-CM

## 2023-05-07 NOTE — Progress Notes (Signed)
Virtual Visit via Video Note   I connected with Lindsay Walsh on 04/09/23 at  3:00 PM EDT by a video enabled telemedicine application and verified that I am speaking with the correct person using two identifiers.   Location: Patient: home Provider: office   I discussed the limitations of evaluation and management by telemedicine and the availability of in person appointments. The patient expressed understanding and agreed to proceed.       THERAPIST PROGRESS NOTE   Session Time: 3:00 PM-3:30 PM   Participation Level: Active   Behavioral Response: CasualAlertDepressed   Type of Therapy: Individual Therapy   Treatment Goals addressed: Coping for MH diagnoses   Interventions: CBT   Summary: Lindsay Walsh is a 52 y.o. female who presents with Depression, ADHD, PTSD  . The OPT therapist worked with the patient for her ongoing OPT treatment session. The OPT therapist utilized Motivational Interviewing to assist in creating therapeutic repore. The patient in the session was engaged and work in collaboration giving feedback about her triggers and symptoms over the past few weeks. The patient spoke about working to implement coping taking walks, going on [employer], socializing, and community outings. The patient identified difficulty with health and will be following up with her PCP, psychiatrist, and Cardiologist.The patient identified primary physical health difficulty with back pain and will be following up after recently doing imaging to see what the treatment moving forward will be based on her health experts feedback. The OPT therapist utilized Cognitive Behavioral Therapy through cognitive restructuring as well as worked with the patient on coping strategies to assist in management of mood. The patient identified interactions with certain family members as triggers and her realization that she had to work on limiting her interaction and her reactive behavior using coping when needed  such as walking away or getting off the phone to help her not get upset. The patient spoke about her intent to try to be more positive mindset and not Catastrophisize .    Suicidal/Homicidal: Nowithout intent/plan   Therapist Response: The OPT therapist worked with the patient for the patients scheduled session. The patient was engaged in her session and gave feedback in relation to triggers, symptoms, and behavior responses over the past few weeks. The OPT therapist worked with the patient utilizing an in session Cognitive Behavioral Therapy exercise. The patient was responsive in the session and verbalized," I know I have to stop talking myself out of being more active and I have to get out of this house more often". The OPT therapist worked with the patient on placing her energy/focus/and attention in areas of her life she has control over and doing her self check ins throughout the week. The OPT therapist promoted the patient being active within her physical health limitations and change environment getting outside the home more .The OPT therapist will continue treatment work with the patient in her next scheduled session   Plan: Return again in 3 weeks.   Diagnosis       Axis I: Recurrent Moderate MDD with anxiety, ADHD inattentive type, PTSD.                             Axis II: No diagnosis     Collaboration of Care: Overview of patient involvement with Dr. Tenny Craw in the med therapy program .   Patient/Guardian was advised Release of Information must be obtained prior to any record release in order  to collaborate their care with an outside provider. Patient/Guardian was advised if they have not already done so to contact the registration department to sign all necessary forms in order for [place] to release information regarding their care.    Consent: Patient/Guardian gives verbal consent for treatment and assignment of benefits for services provided during this visit. Patient/Guardian expressed  understanding and agreed to proceed.    I discussed the assessment and treatment plan with the patient. The patient was provided an opportunity to ask questions and all were answered. The patient agreed with the plan and demonstrated an understanding of the instructions.   The patient was advised to call back or seek an in-person evaluation if the symptoms worsen or if the condition fails to improve as anticipated.   I provided 30 minutes of non-face-to-face time during this encounter.   Winfred Burn, LCSW   05/07/2023

## 2023-05-22 ENCOUNTER — Encounter (HOSPITAL_COMMUNITY): Payer: Self-pay | Admitting: Psychiatry

## 2023-05-22 ENCOUNTER — Ambulatory Visit (INDEPENDENT_AMBULATORY_CARE_PROVIDER_SITE_OTHER): Payer: 59 | Admitting: Psychiatry

## 2023-05-22 DIAGNOSIS — F515 Nightmare disorder: Secondary | ICD-10-CM | POA: Diagnosis not present

## 2023-05-22 DIAGNOSIS — F4312 Post-traumatic stress disorder, chronic: Secondary | ICD-10-CM | POA: Diagnosis not present

## 2023-05-22 MED ORDER — AMPHETAMINE-DEXTROAMPHETAMINE 20 MG PO TABS: 20.0000 mg | ORAL_TABLET | Freq: Two times a day (BID) | ORAL | 0 refills | Status: DC

## 2023-05-22 MED ORDER — PAROXETINE HCL 40 MG PO TABS: 40.0000 mg | ORAL_TABLET | Freq: Every day | ORAL | 2 refills | Status: DC

## 2023-05-22 MED ORDER — PRAZOSIN HCL 2 MG PO CAPS: ORAL_CAPSULE | ORAL | 2 refills | Status: DC

## 2023-05-22 NOTE — Progress Notes (Signed)
BH MD/PA/NP OP Progress Note  05/22/2023 1:57 PM Lindsay Walsh  MRN:  [id number]  Chief Complaint:  Chief Complaint  Patient presents with   Depression   Follow-up   HPI: This patient is a 52 year old divorced white female who lives with her boyfriend in [place].  She is on disability for an autoimmune liver disease.  She has 3 children and 1 granddaughter.  The patient returns for follow-up after 4 months regarding her depression and anxiety and PTSD and ADD.  Last month she stated that she was more depressed and anxious for unclear reasons.  She was discouraged not having any energy did not feel like doing anything.  Her mind was racing all the time and she had trouble sleeping.  She was getting more irritable with her family.  At the time she had been on Cymbalta for quite some time and we made a switch to Paxil 40 mg.  This seems to have helped her tremendously.  She is sleeping better and she is less anxious.  Her thoughts are no longer racing.  She has more energy and is doing things around her house and with her family.  Her mood is much improved.  She denies any thoughts of self-harm or suicide Visit Diagnosis:    ICD-10-CM   1. Nightmares associated with chronic post-traumatic stress disorder  F51.5 prazosin (MINIPRESS) 2 MG capsule   F43.12       Past Psychiatric History: Hospitalization in her early 30s for depression  Past Medical History:  Past Medical History:  Diagnosis Date   Acid reflux    Allergic rhinitis due to pollen    Anxiety    Arthritis    back   Autoimmune hepatitis (HCC)    Back pain    Connective tissue disease (HCC)    Depression    Dizziness and giddiness    Dyspareunia 05/06/2014   Dysrhythmia    palpatations; on propafenone   Essential (primary) hypertension    Fibromyalgia    Gastroesophageal reflux disease    Chronic abdominal pain; gastroparesis; globus hystericus; irritable bowel syndrome   Glaucoma    Hereditary and idiopathic  neuropathy, unspecified    Hyperlipidemia    Irritable bowel syndrome    Major depressive disorder, single episode, unspecified    Morbid (severe) obesity due to excess calories (HCC)    Narcotic dependence (HCC)    Nonspecific mesenteric lymphadenitis    Overweight(278.02)    Pain in limb    Pain in right foot    PTSD (post-traumatic stress disorder)    Pulmonary nodule    Restless legs syndrome    Sleep apnea    on CPAP machine   Stroke (HCC) 2016   Systemic lupus erythematosus (SLE) in adult [facility])    Tobacco abuse    one pack per day; 35 pack years; quit in February 2023   Type 2 diabetes mellitus with hyperglycemia (HCC)     Past Surgical History:  Procedure Laterality Date   ABDOMINAL HYSTERECTOMY     TAH&BSO   BACK SURGERY     BLADDER SUSPENSION  09/12/2011   Procedure: TRANSVAGINAL TAPE (TVT) PROCEDURE;  Surgeon: Ky Barban, MD;  Location: [facility];  Service: Urology;  Laterality: N/A;   CESAREAN SECTION     X3   CHOLECYSTECTOMY     CORONARY PRESSURE/FFR STUDY N/A 04/21/2022   Procedure: INTRAVASCULAR PRESSURE WIRE/FFR STUDY;  Surgeon: Marykay Lex, MD;  Location: [facility] INVASIVE CV LAB;  Service:  Cardiovascular;  Laterality: N/A;   ESOPHAGOGASTRODUODENOSCOPY ENDOSCOPY     "throat stretched" per pt.   LEFT HEART CATH AND CORONARY ANGIOGRAPHY N/A 04/21/2022   Procedure: LEFT HEART CATH AND CORONARY ANGIOGRAPHY;  Surgeon: Marykay Lex, MD;  Location: [facility] INVASIVE CV LAB;  Service: Cardiovascular;  Laterality: N/A;   LIVER BIOPSY     x4   LUMBAR EPIDURAL INJECTION  01/2012   TOTAL ABDOMINAL HYSTERECTOMY W/ BILATERAL SALPINGOOPHORECTOMY     TUBAL LIGATION     Bilateral   UPPER GASTROINTESTINAL ENDOSCOPY  09/13/2010    Family Psychiatric History: See below  Family History:  Family History  Problem Relation Age of Onset   Diabetes Mother    Hypertension Mother    Anxiety disorder Mother    Depression Mother    Drug abuse Mother    Heart disease Father     Diabetes Father    Anxiety disorder Father    Alcohol abuse Father    Depression Father    OCD Father    Thyroid disease Sister    Healthy Brother    Healthy Daughter    Healthy Son    ADD / ADHD Son    Alcohol abuse Paternal Grandfather    Depression Paternal Grandfather    Cancer Paternal Grandfather        lung,skin   Tuberculosis Paternal Grandfather    Seizures Paternal Grandmother    Lupus Paternal Grandmother    ADD / ADHD Son    Anesthesia problems Neg Hx    Malignant hyperthermia Neg Hx    Pseudochol deficiency Neg Hx    Hypotension Neg Hx    Bipolar disorder Neg Hx    Dementia Neg Hx    Paranoid behavior Neg Hx    Schizophrenia Neg Hx    Sexual abuse Neg Hx    Physical abuse Neg Hx     Social History:  Social History   Socioeconomic History   Marital status: Divorced    Spouse name: Not on file   Number of children: 3   Years of education: GED   Highest education level: Not on file  Occupational History    Employer: NOT EMPLOYED  Tobacco Use   Smoking status: Former    Current packs/day: 0.00    Average packs/day: 1.5 packs/day for 25.0 years (37.5 ttl pk-yrs)    Types: Cigarettes    Start date: 04/25/1988    Quit date: 04/25/2013    Years since quitting: 10.0    Passive exposure: Never   Smokeless tobacco: Never  Vaping Use   Vaping status: Every Day   Start date: 04/22/2013   Substances: Nicotine, Flavoring  Substance and Sexual Activity   Alcohol use: No   Drug use: No   Sexual activity: Yes    Birth control/protection: Surgical, Abstinence  Other Topics Concern   Not on file  Social History Narrative   Divorced since 1994.Lives with boyfriend of 11 years.On disability.   Social Determinants of Health   Financial Resource Strain: Not on file  Food Insecurity: Not on file  Transportation Needs: Not on file  Physical Activity: Not on file  Stress: Not on file  Social Connections: Not on file    Allergies:  Allergies  Allergen  Reactions   Doxycycline Shortness Of Breath and Rash    Heart racing   Fentanyl Shortness Of Breath, Itching and Other (See Comments)    Heart racing, SOB   Cefoxitin     Unknown reaction  Ceftin [Cefuroxime Axetil]     Unknown reaction   Iodinated Contrast Media Other (See Comments)    Knots on body   Iohexol Other (See Comments)    Knots on body    Norvasc [Amlodipine Besylate]     Unknown reaction   Trintellix [Vortioxetine]     Tremors, heart race   Wellbutrin [Bupropion Hcl] Nausea And Vomiting   Latex Rash   Tape Rash    Metabolic Disorder Labs: Lab Results  Component Value Date   HGBA1C 11.4 (H) 08/03/2020   MPG 280 08/03/2020   MPG 237.43 08/11/2018   No results found for: "PROLACTIN" Lab Results  Component Value Date   CHOL 153 04/25/2019   TRIG 141 04/25/2019   HDL 37 04/25/2019   CHOLHDL 4.9 03/06/2015   VLDL 16 03/06/2015   LDLCALC 91 04/25/2019   LDLCALC 104 (H) 03/06/2015   Lab Results  Component Value Date   TSH 4.616 (H) 05/23/2022   TSH 2.79 08/03/2020    Therapeutic Level Labs: No results found for: "LITHIUM" No results found for: "VALPROATE" No results found for: "CBMZ"  Current Medications: Current Outpatient Medications  Medication Sig Dispense Refill   ACCU-CHEK GUIDE test strip USE TO CHECK BLOOD SUGAR FOUR TIMES DAILY     Accu-Chek Softclix Lancets lancets CHECK SUGAR ONCE DAILY     amphetamine-dextroamphetamine (ADDERALL) 20 MG tablet Take 1 tablet (20 mg total) by mouth 2 (two) times daily. 60 tablet 0   atenolol (TENORMIN) 100 MG tablet Take 100 mg by mouth daily.     azaTHIOprine (IMURAN) 50 MG tablet Take 1 tablet (50 mg total) by mouth daily. 90 tablet 1   bimatoprost (LUMIGAN) 0.01 % SOLN Place 1 drop into both eyes at bedtime.     Calcium Citrate-Vitamin D3 (CALCIUM CITRATE +D) 315-6.25 MG-MCG TABS Take 1 tablet by mouth 2 (two) times daily.     cholecalciferol (VITAMIN D3) 25 MCG (1000 UNIT) tablet Take 2,000 Units by  mouth daily.     cycloSPORINE (RESTASIS) 0.05 % ophthalmic emulsion Place 1 drop into both eyes 2 (two) times daily.     dicyclomine (BENTYL) 10 MG capsule Take 1 capsule (10 mg total) by mouth 2 (two) times daily as needed. 60 capsule 2   Dulaglutide (TRULICITY) 0.75 MG/0.5ML SOPN Inject 0.75 mg into the skin once a week. 2 mL 3   esomeprazole (NEXIUM) 40 MG capsule Take 1 capsule (40 mg total) by mouth daily at 12 noon. 90 capsule 3   FARXIGA 10 MG TABS tablet Take 10 mg by mouth daily.     fexofenadine (ALLEGRA) 180 MG tablet Take 180 mg by mouth daily.     gabapentin (NEURONTIN) 100 MG capsule Take 100 mg by mouth 3 (three) times daily.     HYDROcodone-acetaminophen (NORCO/VICODIN) 5-325 MG tablet Take 1 tablet by mouth 2 (two) times daily as needed for moderate pain. 0.5 -1 po bid prn     hydroxychloroquine (PLAQUENIL) 200 MG tablet TAKE 1 TABLET(200 MG) BY MOUTH TWICE DAILY 180 tablet 0   metFORMIN (GLUCOPHAGE) 1000 MG tablet Take 1 tablet (1,000 mg total) by mouth 2 (two) times daily with a meal. 60 tablet 3   methocarbamol (ROBAXIN) 500 MG tablet Take 500 mg by mouth 2 (two) times daily as needed for muscle spasms.     metoCLOPramide (REGLAN) 5 MG tablet Take 1 tablet (5 mg total) by mouth 3 (three) times daily before meals. 210 tablet 3   Multiple Vitamins-Minerals (CENTRUM)  tablet Take 1 tablet by mouth daily.     nitroGLYCERIN (NITROLINGUAL) 0.4 MG/SPRAY spray Place 1 spray under the tongue every 5 (five) minutes x 3 doses as needed for chest pain.     NP THYROID 60 MG tablet TAKE 1 TABLET(60 MG) BY MOUTH DAILY BEFORE BREAKFAST (Patient taking differently: Take 60 mg by mouth daily before breakfast.) 30 tablet 3   Polyvinyl Alcohol-Povidone (REFRESH OP) Place 1 drop into both eyes daily as needed (dry eyes).     PRESCRIPTION MEDICATION Take 10 mg by mouth 3 (three) times daily before meals. This is MOTILLIUM ( DOMPERIDONE 10 mg) Tablet     PREVIDENT 5000 PLUS 1.1 % CREA dental cream  Place 1 Application onto teeth every morning.     Probiotic Product (PROBIOTIC PO) Take 1 capsule by mouth daily.     propafenone (RYTHMOL SR) 225 MG 12 hr capsule Take 225 mg by mouth every 12 (twelve) hours.     REPATHA 140 MG/ML SOSY Inject 1 mL into the skin every 14 (fourteen) days.     rosuvastatin (CRESTOR) 40 MG tablet Take 40 mg by mouth daily.     spironolactone (ALDACTONE) 25 MG tablet Take 25 mg by mouth daily.     PARoxetine (PAXIL) 40 MG tablet Take 1 tablet (40 mg total) by mouth at bedtime. 90 tablet 2   prazosin (MINIPRESS) 2 MG capsule TAKE 1 CAPSULE(2 MG) BY MOUTH AT BEDTIME 30 capsule 2   No current facility-administered medications for this visit.     Musculoskeletal: Strength & Muscle Tone: within normal limits Gait & Station: normal Patient leans: N/A  Psychiatric Specialty Exam: Review of Systems  Musculoskeletal:  Positive for arthralgias and myalgias.  All other systems reviewed and are negative.   Blood pressure 131/86, pulse 87, height 5' (1.524 m), weight 196 lb 3.2 oz (89 kg), SpO2 94%.Body mass index is 38.32 kg/m.  General Appearance: Casual, Neat, and Well Groomed  Eye Contact:  Good  Speech:  Clear and Coherent  Volume:  Normal  Mood:  Euthymic  Affect:  Congruent  Thought Process:  Goal Directed  Orientation:  Full (Time, Place, and Person)  Thought Content: WDL   Suicidal Thoughts:  No  Homicidal Thoughts:  No  Memory:  Immediate;   Good Recent;   Good Remote;   Good  Judgement:  Good  Insight:  Good  Psychomotor Activity:  Normal  Concentration:  Concentration: Good and Attention Span: Good  Recall:  Good  Fund of Knowledge: Good  Language: Good  Akathisia:  No  Handed:  Right  AIMS (if indicated): not done  Assets:  Communication Skills Desire for Improvement Resilience Social Support  ADL's:  Intact  Cognition: WNL  Sleep:  Good   Screenings: GAD-7    Flowsheet Row Office Visit from 05/22/2023 in [facility] [place] Office Visit from 04/23/2023 in [facility] [place] Counselor from 09/04/2022 in [facility] at [place]  Total GAD-7 Score 7 16 19       PHQ2-9    Flowsheet Row Office Visit from 05/22/2023 in [facility] [place] Office Visit from 04/23/2023 in [facility] [place] Counselor from 09/04/2022 in [facility] at [place] Video Visit from 02/08/2022 in [facility] at [place] Video Visit from 11/09/2021 in [facility] at [facility] Total Score 2 6  6 2 0  PHQ-9 Total Score 6 19 17 7  --      Flowsheet Row Office Visit from 04/23/2023 in [facility] [place] [facility] from 10/27/2022 in [facility] at [facility] Counselor from 09/04/2022 in [facility] at [facility]  C-SSRS RISK CATEGORY No Risk No Risk No Risk        Assessment and Plan: This patient is a 52 year old female with a history of PTSD nightmares depression anxiety and ADHD.  She is doing much better on the Paxil 40 mg so this will be continued for depression and anxiety.  She will also continue prazosin 20 mg at bedtime for nightmares and Adderall 20 mg twice daily for ADHD.  She will return to see me in 3 months  Collaboration of Care: Collaboration of Care: Referral or follow-up with counselor/therapist AEB patient will continue therapy with Suzan Garibaldi in our office  Patient/Guardian was advised Release of Information must be obtained prior to any record release in order to collaborate their care with an outside provider. Patient/Guardian was advised if they have not already done so to contact the registration department to sign all necessary forms in order for [place] to release information regarding  their care.   Consent: Patient/Guardian gives verbal consent for treatment and assignment of benefits for services provided during this visit. Patient/Guardian expressed understanding and agreed to proceed.    Diannia Ruder, MD 05/22/2023, 1:57 PM

## 2023-06-11 ENCOUNTER — Ambulatory Visit (HOSPITAL_COMMUNITY): Payer: 59 | Admitting: Clinical

## 2023-06-18 NOTE — Progress Notes (Unsigned)
Office Visit Note  Patient: Lindsay Walsh             Date of Birth: 09-20-1970           MRN: [id number]             PCP: Eliezer Lofts, MD Referring: Eliezer Lofts, MD Visit Date: 07/02/2023   Subjective:  No chief complaint on file.   History of Present Illness: Lindsay Walsh is a 52 y.o. female here for follow up for seronegative inflammatory arthritis on hydroxychloroquine 400 mg daily also on azathioprine 50 mg daily for autoimmune hepatitis.    Previous HPI 03/28/2023 Lindsay Walsh is a 52 y.o. female here for follow up for seronegative inflammatory arthritis on hydroxychloroquine 400 mg daily also on azathioprine 50 mg daily for autoimmune hepatitis.  Overall feels like her arthritis problems are doing pretty well except with right knee pain.  This is aggravating more on some days especially like with weather change and is worse during movements requiring deep bending and getting up from low positions or climbing stairs.  Carpal tunnel symptoms are still doing better she is noticing some partial increase coming back recently.  She saw Dr. Franky Macho had MRI of the lumbar spine there was facet arthropathy noted and has follow-up scheduled may be pursuing local injections for this soon.     Previous HPI 09/25/2022 Lindsay Walsh is a 52 y.o. female here for follow up for seronegative inflammatory arthritis on hydroxychloroquine 400 mg daily.  Her hand and wrist symptoms have remained well improved after steroid injection in September.  However she is having more trouble with some joint pains predominantly on the left side.  Left shoulder is causing pain for months that is persistent she cannot raise it above horizontal without significant pain and frequently gets abrupt pain and awakens her from sleep if she rolls onto the left side at night. She had this evaluated including xray last month and was told it showed calcification. Referred to PT but has not been able to get  any scheduled with her insurance so far. In the past 1 month is having more left hip pain on the side of the hip also gets worse with pressure on that area but also notices it just getting up and down from seated positions not as often while walking.  She has had some persistent bruises on the right arm present for several months neither resolving nor expanding.  She also had heart catheterization with diagnoses of coronary artery disease started on Repatha.  She is scheduled for ophthalmology follow-up next month with [facility].   Previous HPI 03/20/22 Lindsay Walsh is a 52 y.o. female here for follow up for suspected seronegative arthritis with positive RNP Abs and autoimmune hepatitis. She started taking hydroxychloroquine 200 mg daily for this and feels her symptoms are overall somewhat improved. She still has numbness and joint pains in bilateral hands. Not seeing significant swelling and no rashes. Labs reviewed from 8/21 at [facility] including CBC and LFTs were normal.   Previous HPI 11/16/2021 Lindsay Walsh is a 52 y.o. female here for follow up for abnormal lab findings and continued ongoing joint pain in multiple areas.  After our last visit did not recommend any specific new anti-inflammatory medication.  She feels overall symptoms are doing worse compared to a year ago.  She has pain pretty much all over.  Some days she feels a sensitivity and pain other times feels  like she has more numbness or diminished sensation throughout.  She is getting swelling in her hands and feet that is worse by the end of the day.  She keeps persistent fatigue usually regardless of her sleep quality.  Mixed constipation and diarrhea symptoms.  Repeat laboratory testing by her pain management provider concerning for worsening lab markers for connective tissue disease or inflammation.  Liver function test been remaining stable on Imuran.   Previous HPI 06/16/2020 Lindsay Walsh is a 51 y.o. female here for follow up  with hand pain and sometimes all over pain, positive RNP antibodies here for repeat assessment for inflammatory joint pain, nailfold capillaroscopy, and positive RNP test. She continues to have pain in multiple places including her hands, knees, also neck and upper back. No signficant changes since her initial visit.   No Rheumatology ROS completed.   PMFS History:  Patient Active Problem List   Diagnosis Date Noted   Pain in right knee 03/28/2023   Diabetes mellitus without complication (HCC) 03/27/2023   Liver fibrosis 03/02/2023   Pain in left shoulder 09/25/2022   Progressive angina (HCC)    Coronary artery calcification seen on CT scan    History of nuclear stress test    Abnormal nuclear stress test 04/11/2022   Atypical angina (HCC) 04/11/2022   Bilateral carpal tunnel syndrome 04/05/2022   Positive ANA (antinuclear antibody) 11/16/2021   History of colonic polyps 11/15/2021   Bilateral hand pain 06/02/2020   OAB (overactive bladder) 11/05/2019   Recurrent UTI 11/05/2019   Diabetic gastroparesis (HCC) 08/21/2019   Diarrhea 07/15/2019   Abnormal finding on urinalysis 07/15/2019   Morbid (severe) obesity due to excess calories (HCC)    Major depressive disorder, single episode, unspecified    Restless legs syndrome    Sleep apnea    Fibromyalgia    Hereditary and idiopathic neuropathy, unspecified    Nonspecific mesenteric lymphadenitis    Pain in right foot    Type 2 diabetes mellitus with hyperglycemia (HCC)    Hypothyroidism 08/22/2018   DM type 2 causing vascular disease (HCC) 08/21/2018   Hyperlipidemia associated with type 2 diabetes mellitus (HCC) 08/21/2018   Essential hypertension, benign 08/21/2018   Influenza A 08/10/2018   SIRS (systemic inflammatory response syndrome) (HCC) 08/10/2018   Cerebral infarction (HCC) 03/05/2015   Dyspareunia 05/06/2014   Obesity 09/30/2013   Bilateral hip pain 07/16/2013   Chronic pain syndrome 07/16/2013   Degenerative disc  disease, lumbar 07/16/2013   Greater trochanteric bursitis of both hips 07/16/2013   Nightmares associated with chronic post-traumatic stress disorder 10/18/2012   Sprain of foot 03/14/2012   Weakness 01/29/2012   Irritable bowel syndrome with diarrhea 08/01/2011   Gastroesophageal reflux disease    Depression    Chest pain    Fasting hyperglycemia    TOBACCO ABUSE 04/28/2010   NARCOTIC ABUSE 04/28/2010   PULMONARY NODULE 04/28/2010   Autoimmune hepatitis (HCC) 04/28/2010   Myalgia and myositis 01/06/2009    Past Medical History:  Diagnosis Date   Acid reflux    Allergic rhinitis due to pollen    Anxiety    Arthritis    back   Autoimmune hepatitis (HCC)    Back pain    Connective tissue disease (HCC)    Depression    Dizziness and giddiness    Dyspareunia 05/06/2014   Dysrhythmia    palpatations; on propafenone   Essential (primary) hypertension    Fibromyalgia    Gastroesophageal reflux disease  Chronic abdominal pain; gastroparesis; globus hystericus; irritable bowel syndrome   Glaucoma    Hereditary and idiopathic neuropathy, unspecified    Hyperlipidemia    Irritable bowel syndrome    Major depressive disorder, single episode, unspecified    Morbid (severe) obesity due to excess calories (HCC)    Narcotic dependence (HCC)    Nonspecific mesenteric lymphadenitis    Overweight(278.02)    Pain in limb    Pain in right foot    PTSD (post-traumatic stress disorder)    Pulmonary nodule    Restless legs syndrome    Sleep apnea    on CPAP machine   Stroke (HCC) 2016   Systemic lupus erythematosus (SLE) in adult [facility])    Tobacco abuse    one pack per day; 35 pack years; quit in February 2023   Type 2 diabetes mellitus with hyperglycemia (HCC)     Family History  Problem Relation Age of Onset   Diabetes Mother    Hypertension Mother    Anxiety disorder Mother    Depression Mother    Drug abuse Mother    Heart disease Father    Diabetes Father    Anxiety  disorder Father    Alcohol abuse Father    Depression Father    OCD Father    Thyroid disease Sister    Healthy Brother    Healthy Daughter    Healthy Son    ADD / ADHD Son    Alcohol abuse Paternal Grandfather    Depression Paternal Grandfather    Cancer Paternal Grandfather        lung,skin   Tuberculosis Paternal Grandfather    Seizures Paternal Grandmother    Lupus Paternal Grandmother    ADD / ADHD Son    Anesthesia problems Neg Hx    Malignant hyperthermia Neg Hx    Pseudochol deficiency Neg Hx    Hypotension Neg Hx    Bipolar disorder Neg Hx    Dementia Neg Hx    Paranoid behavior Neg Hx    Schizophrenia Neg Hx    Sexual abuse Neg Hx    Physical abuse Neg Hx    Past Surgical History:  Procedure Laterality Date   ABDOMINAL HYSTERECTOMY     TAH&BSO   BACK SURGERY     BLADDER SUSPENSION  09/12/2011   Procedure: TRANSVAGINAL TAPE (TVT) PROCEDURE;  Surgeon: Ky Barban, MD;  Location: [facility];  Service: Urology;  Laterality: N/A;   CESAREAN SECTION     X3   CHOLECYSTECTOMY     CORONARY PRESSURE/FFR STUDY N/A 04/21/2022   Procedure: INTRAVASCULAR PRESSURE WIRE/FFR STUDY;  Surgeon: Marykay Lex, MD;  Location: [facility] INVASIVE CV LAB;  Service: Cardiovascular;  Laterality: N/A;   ESOPHAGOGASTRODUODENOSCOPY ENDOSCOPY     "throat stretched" per pt.   LEFT HEART CATH AND CORONARY ANGIOGRAPHY N/A 04/21/2022   Procedure: LEFT HEART CATH AND CORONARY ANGIOGRAPHY;  Surgeon: Marykay Lex, MD;  Location: [facility] INVASIVE CV LAB;  Service: Cardiovascular;  Laterality: N/A;   LIVER BIOPSY     x4   LUMBAR EPIDURAL INJECTION  01/2012   TOTAL ABDOMINAL HYSTERECTOMY W/ BILATERAL SALPINGOOPHORECTOMY     TUBAL LIGATION     Bilateral   UPPER GASTROINTESTINAL ENDOSCOPY  09/13/2010   Social History   Social History Narrative   Divorced since 1994.Lives with boyfriend of 11 years.On disability.   Immunization History  Administered Date(s) Administered   Hepatitis A, Adult  06/24/2020, 02/27/2022   Influenza Inj Mdck  Quad Pf 04/20/2016   Influenza Split 04/07/2013, 05/17/2017   Influenza,inj,Quad PF,6+ Mos 05/17/2017, 04/04/2018, 03/31/2019, 04/28/2019   Influenza-Unspecified 04/17/2012, 03/20/2014, 08/09/2014, 05/06/2015, 06/24/2020   Moderna Sars-Covid-2 Vaccination 02/06/2020, 03/05/2020   Pneumococcal Conjugate-13 12/18/2019   Pneumococcal Polysaccharide-23 04/20/2016     Objective: Vital Signs: There were no vitals taken for this visit.   Physical Exam   Musculoskeletal Exam: ***  CDAI Exam: CDAI Score: -- Patient Global: --; Provider Global: -- Swollen: --; Tender: -- Joint Exam 07/02/2023   No joint exam has been documented for this visit   There is currently no information documented on the homunculus. Go to the Rheumatology activity and complete the homunculus joint exam.  Investigation: No additional findings.  Imaging: No results found.  Recent Labs: Lab Results  Component Value Date   WBC 7.6 03/06/2023   HGB 12.5 03/06/2023   PLT 267 03/06/2023   NA 141 03/06/2023   K 4.5 03/06/2023   CL 103 03/06/2023   CO2 25 03/06/2023   GLUCOSE 122 (H) 03/06/2023   BUN 9 03/06/2023   CREATININE 0.87 03/06/2023   BILITOT 0.4 03/06/2023   ALKPHOS 105 03/06/2023   AST 18 03/06/2023   ALT 12 03/06/2023   PROT 6.4 03/06/2023   ALBUMIN 4.1 03/06/2023   CALCIUM 9.4 03/06/2023   GFRAA 101 08/03/2020    Speciality Comments: PLQ Eye Exam 11/14/2022 normal Dr. Dione Booze f/u 6 months  Procedures:  No procedures performed Allergies: Doxycycline, Fentanyl, Cefoxitin, Ceftin [cefuroxime axetil], Iodinated contrast media, Iohexol, Norvasc [amlodipine besylate], Trintellix [vortioxetine], Wellbutrin [bupropion hcl], Latex, and Tape   Assessment / Plan:     Visit Diagnoses: No diagnosis found.  ***  Orders: No orders of the defined types were placed in this encounter.  No orders of the defined types were placed in this  encounter.    Follow-Up Instructions: No follow-ups on file.   Metta Clines, RT  Note - This record has been created using [employer].  Chart creation errors have been sought, but may not always  have been located. Such creation errors do not reflect on  the standard of medical care.

## 2023-07-02 ENCOUNTER — Ambulatory Visit: Payer: 59 | Admitting: Internal Medicine

## 2023-07-02 DIAGNOSIS — Z79899 Other long term (current) drug therapy: Secondary | ICD-10-CM

## 2023-07-02 DIAGNOSIS — R768 Other specified abnormal immunological findings in serum: Secondary | ICD-10-CM

## 2023-07-02 DIAGNOSIS — G8929 Other chronic pain: Secondary | ICD-10-CM

## 2023-07-02 DIAGNOSIS — G5603 Carpal tunnel syndrome, bilateral upper limbs: Secondary | ICD-10-CM

## 2023-07-02 DIAGNOSIS — K754 Autoimmune hepatitis: Secondary | ICD-10-CM

## 2023-07-25 ENCOUNTER — Other Ambulatory Visit (HOSPITAL_COMMUNITY): Payer: Self-pay | Admitting: Psychiatry

## 2023-07-30 ENCOUNTER — Ambulatory Visit (INDEPENDENT_AMBULATORY_CARE_PROVIDER_SITE_OTHER): Payer: 59 | Admitting: Clinical

## 2023-07-30 DIAGNOSIS — F419 Anxiety disorder, unspecified: Secondary | ICD-10-CM

## 2023-07-30 DIAGNOSIS — F900 Attention-deficit hyperactivity disorder, predominantly inattentive type: Secondary | ICD-10-CM | POA: Diagnosis not present

## 2023-07-30 DIAGNOSIS — F331 Major depressive disorder, recurrent, moderate: Secondary | ICD-10-CM

## 2023-07-30 NOTE — Progress Notes (Addendum)
Virtual Visit via Video Note   I connected with Lindsay Walsh on 07/30/23 at  2:00 PM EDT by a video enabled telemedicine application and verified that I am speaking with the correct person using two identifiers.   Location: Patient: home Provider: office   I discussed the limitations of evaluation and management by telemedicine and the availability of in person appointments. The patient expressed understanding and agreed to proceed.       THERAPIST PROGRESS NOTE   Session Time: 2:00 PM-2:25 PM   Participation Level: Active   Behavioral Response: CasualAlertDepressed   Type of Therapy: Individual Therapy   Treatment Goals addressed: Coping for MH diagnoses   Interventions: CBT   Summary: Lindsay Walsh is a 53 y.o. female who presents with Depression, ADHD, PTSD  . The OPT therapist worked with the patient for her ongoing OPT treatment session. The OPT therapist utilized Motivational Interviewing to assist in creating therapeutic repore. The patient in the session was engaged and work in collaboration giving feedback about her triggers and symptoms over the past few weeks. The patient spoke about adjustment with her med therapy yielding positive results in management of her MH symptoms.The patient spoke about her experiences and interactions over the holidays and into the new year with family these were reported to go well. The OPT therapist utilized Cognitive Behavioral Therapy through cognitive restructuring as well as worked with the patient on coping strategies to assist in management of mood. The patient identified using  indoor coping  (including pulling for her [employer] team the [employer]) when needed through the Winter season thus far with extreme weather outside and 2 snow events since the beginning of the year .    Suicidal/Homicidal: Nowithout intent/plan   Therapist Response: The OPT therapist worked with the patient for the patients scheduled session. The  patient was engaged in her session and gave feedback in relation to triggers, symptoms, and behavior responses over the past few weeks. The OPT therapist worked with the patient utilizing an in session Cognitive Behavioral Therapy exercise. The patient was responsive in the session and verbalized," I really enjoyed the holidays and the snow we have gotten in January I actually like the cold weather I have been trying to stay in and keep busy working on things inside the home and watching Netflix and my [employer] play in the playoffs". The OPT therapist worked with the patient on placing her energy/focus/and attention in areas of her life she has control over and doing her self check ins throughout the week. The OPT therapist promoted the patient being active within her physical health limitations and change environment when possible once we have better/ warmer outside weather .The OPT therapist will continue treatment work with the patient in her next scheduled session   Plan: Return again in 3 weeks.   Diagnosis       Axis I: Recurrent Moderate MDD with anxiety, ADHD inattentive type, PTSD.                             Axis II: No diagnosis     Collaboration of Care: Overview of patient involvement with Dr. Tenny Craw in the med therapy program .   Patient/Guardian was advised Release of Information must be obtained prior to any record release in order to collaborate their care with an outside provider. Patient/Guardian was advised if they have not already done so to contact the registration department  to sign all necessary forms in order for [place] to release information regarding their care.    Consent: Patient/Guardian gives verbal consent for treatment and assignment of benefits for services provided during this visit. Patient/Guardian expressed understanding and agreed to proceed.    I discussed the assessment and treatment plan with the patient. The patient was provided an opportunity to ask  questions and all were answered. The patient agreed with the plan and demonstrated an understanding of the instructions.   The patient was advised to call back or seek an in-person evaluation if the symptoms worsen or if the condition fails to improve as anticipated.   I provided 25 minutes of non-face-to-face time during this encounter.   Winfred Burn, LCSW   07/30/2023

## 2023-08-13 ENCOUNTER — Ambulatory Visit (INDEPENDENT_AMBULATORY_CARE_PROVIDER_SITE_OTHER): Payer: 59 | Admitting: Gastroenterology

## 2023-08-14 ENCOUNTER — Other Ambulatory Visit: Payer: Self-pay | Admitting: Internal Medicine

## 2023-08-14 ENCOUNTER — Telehealth (HOSPITAL_COMMUNITY): Payer: 59 | Admitting: Psychiatry

## 2023-08-14 ENCOUNTER — Encounter (HOSPITAL_COMMUNITY): Payer: Self-pay | Admitting: Psychiatry

## 2023-08-14 DIAGNOSIS — F331 Major depressive disorder, recurrent, moderate: Secondary | ICD-10-CM

## 2023-08-14 DIAGNOSIS — F515 Nightmare disorder: Secondary | ICD-10-CM

## 2023-08-14 DIAGNOSIS — F900 Attention-deficit hyperactivity disorder, predominantly inattentive type: Secondary | ICD-10-CM | POA: Diagnosis not present

## 2023-08-14 DIAGNOSIS — F419 Anxiety disorder, unspecified: Secondary | ICD-10-CM

## 2023-08-14 DIAGNOSIS — R768 Other specified abnormal immunological findings in serum: Secondary | ICD-10-CM

## 2023-08-14 DIAGNOSIS — M79642 Pain in left hand: Secondary | ICD-10-CM

## 2023-08-14 DIAGNOSIS — R7689 Other specified abnormal immunological findings in serum: Secondary | ICD-10-CM

## 2023-08-14 DIAGNOSIS — F4312 Post-traumatic stress disorder, chronic: Secondary | ICD-10-CM

## 2023-08-14 DIAGNOSIS — M79641 Pain in right hand: Secondary | ICD-10-CM

## 2023-08-14 MED ORDER — AMPHETAMINE-DEXTROAMPHETAMINE 20 MG PO TABS: 20.0000 mg | ORAL_TABLET | Freq: Two times a day (BID) | ORAL | 0 refills | Status: DC

## 2023-08-14 MED ORDER — PRAZOSIN HCL 2 MG PO CAPS: ORAL_CAPSULE | ORAL | 2 refills | Status: DC

## 2023-08-14 MED ORDER — ARIPIPRAZOLE 2 MG PO TABS: 2.0000 mg | ORAL_TABLET | Freq: Every day | ORAL | 2 refills | Status: DC

## 2023-08-14 MED ORDER — PAROXETINE HCL 40 MG PO TABS: 40.0000 mg | ORAL_TABLET | Freq: Every day | ORAL | 2 refills | Status: DC

## 2023-08-14 NOTE — Telephone Encounter (Signed)
 Patient contacted the office to request a medication refill.   1. Name of Medication: Plaquenil   2. How are you currently taking this medication (dosage and times per day)? 200 MG   3. What pharmacy would you like for that to be sent to? [facility]- [address]

## 2023-08-14 NOTE — Progress Notes (Signed)
 Virtual Visit via Video Note  I connected with Lindsay Walsh on 08/14/23 at  1:20 PM EST by a video enabled telemedicine application and verified that I am speaking with the correct person using two identifiers.  Location: Patient: home Provider: office   I discussed the limitations of evaluation and management by telemedicine and the availability of in person appointments. The patient expressed understanding and agreed to proceed.      I discussed the assessment and treatment plan with the patient. The patient was provided an opportunity to ask questions and all were answered. The patient agreed with the plan and demonstrated an understanding of the instructions.   The patient was advised to call back or seek an in-person evaluation if the symptoms worsen or if the condition fails to improve as anticipated.  I provided 20 minutes of non-face-to-face time during this encounter.   Barnie Gull, MD  [facility] MD/PA/NP OP Progress Note  08/14/2023 1:26 PM Lindsay Walsh  MRN:  [id number]  Chief Complaint:  Chief Complaint  Patient presents with   Depression   Anxiety   Follow-up   HPI: This patient is a 53 year old divorced white female who lives with her boyfriend in [place]. She is on disability for an autoimmune liver disease. She has 3 children and 1 granddaughter.   The patient returns for follow-up after 3 months regarding her depression anxiety PTSD and ADD.  Last time she seemed to have been much improved following switch from Cymbalta  to Paxil .  However now she feels depressed again.  Her energy is low and she has been sitting around the house again.  She states that she does not have the energy or motivation to get up and do things.  She is sleeping well.  She denies significant anxiety but just feels sad.  She is lonely because her boyfriend works second shift.  Her  grown children are busy.  She is often lonely but has not yet joined the [employer] like she promised herself.  She  denies any thoughts of self-harm or suicide.  Since she is already on Paxil  40 mg I am reluctant to go up higher because of side effects such as hyponatremia.  I did rather add Abilify  for augmentation and she is in agreement.  She is still focusing well with the Adderall Visit Diagnosis:    ICD-10-CM   1. Recurrent moderate major depressive disorder with anxiety (HCC)  F33.1    F41.9     2. Attention deficit hyperactivity disorder (ADHD), predominantly inattentive type  F90.0       Past Psychiatric History: Hospitalization in her early 67s for depression  Past Medical History:  Past Medical History:  Diagnosis Date   Acid reflux    Allergic rhinitis due to pollen    Anxiety    Arthritis    back   Autoimmune hepatitis (HCC)    Back pain    Connective tissue disease (HCC)    Depression    Dizziness and giddiness    Dyspareunia 05/06/2014   Dysrhythmia    palpatations; on propafenone   Essential (primary) hypertension    Fibromyalgia    Gastroesophageal reflux disease    Chronic abdominal pain; gastroparesis; globus hystericus; irritable bowel syndrome   Glaucoma    Hereditary and idiopathic neuropathy, unspecified    Hyperlipidemia    Irritable bowel syndrome    Major depressive disorder, single episode, unspecified    Morbid (severe) obesity due to excess calories (HCC)  Narcotic dependence (HCC)    Nonspecific mesenteric lymphadenitis    Overweight(278.02)    Pain in limb    Pain in right foot    PTSD (post-traumatic stress disorder)    Pulmonary nodule    Restless legs syndrome    Sleep apnea    on CPAP machine   Stroke (HCC) 2016   Systemic lupus erythematosus (SLE) in adult [facility])    Tobacco abuse    one pack per day; 35 pack years; quit in February 2023   Type 2 diabetes mellitus with hyperglycemia (HCC)     Past Surgical History:  Procedure Laterality Date   ABDOMINAL HYSTERECTOMY     TAH&BSO   BACK SURGERY     BLADDER SUSPENSION  09/12/2011    Procedure: TRANSVAGINAL TAPE (TVT) PROCEDURE;  Surgeon: Emery LILLETTE Blaze, MD;  Location: [facility];  Service: Urology;  Laterality: N/A;   CESAREAN SECTION     X3   CHOLECYSTECTOMY     CORONARY PRESSURE/FFR STUDY N/A 04/21/2022   Procedure: INTRAVASCULAR PRESSURE WIRE/FFR STUDY;  Surgeon: Anner Alm ORN, MD;  Location: [facility] INVASIVE CV LAB;  Service: Cardiovascular;  Laterality: N/A;   ESOPHAGOGASTRODUODENOSCOPY ENDOSCOPY     throat stretched per pt.   LEFT HEART CATH AND CORONARY ANGIOGRAPHY N/A 04/21/2022   Procedure: LEFT HEART CATH AND CORONARY ANGIOGRAPHY;  Surgeon: Anner Alm ORN, MD;  Location: [facility] INVASIVE CV LAB;  Service: Cardiovascular;  Laterality: N/A;   LIVER BIOPSY     x4   LUMBAR EPIDURAL INJECTION  01/2012   TOTAL ABDOMINAL HYSTERECTOMY W/ BILATERAL SALPINGOOPHORECTOMY     TUBAL LIGATION     Bilateral   UPPER GASTROINTESTINAL ENDOSCOPY  09/13/2010    Family Psychiatric History: See below  Family History:  Family History  Problem Relation Age of Onset   Diabetes Mother    Hypertension Mother    Anxiety disorder Mother    Depression Mother    Drug abuse Mother    Heart disease Father    Diabetes Father    Anxiety disorder Father    Alcohol abuse Father    Depression Father    OCD Father    Thyroid  disease Sister    Healthy Brother    Healthy Daughter    Healthy Son    ADD / ADHD Son    Alcohol abuse Paternal Grandfather    Depression Paternal Grandfather    Cancer Paternal Grandfather        lung,skin   Tuberculosis Paternal Grandfather    Seizures Paternal Grandmother    Lupus Paternal Grandmother    ADD / ADHD Son    Anesthesia problems Neg Hx    Malignant hyperthermia Neg Hx    Pseudochol deficiency Neg Hx    Hypotension Neg Hx    Bipolar disorder Neg Hx    Dementia Neg Hx    Paranoid behavior Neg Hx    Schizophrenia Neg Hx    Sexual abuse Neg Hx    Physical abuse Neg Hx     Social History:  Social History   Socioeconomic History    Marital status: Divorced    Spouse name: Not on file   Number of children: 3   Years of education: GED   Highest education level: Not on file  Occupational History    Employer: NOT EMPLOYED  Tobacco Use   Smoking status: Former    Current packs/day: 0.00    Average packs/day: 1.5 packs/day for 25.0 years (37.5 ttl pk-yrs)  Types: Cigarettes    Start date: 04/25/1988    Quit date: 04/25/2013    Years since quitting: 10.3    Passive exposure: Never   Smokeless tobacco: Never  Vaping Use   Vaping status: Every Day   Start date: 04/22/2013   Substances: Nicotine , Flavoring  Substance and Sexual Activity   Alcohol use: No   Drug use: No   Sexual activity: Yes    Birth control/protection: Surgical, Abstinence  Other Topics Concern   Not on file  Social History Narrative   Divorced since 1994.Lives with boyfriend of 11 years.On disability.   Social Drivers of Corporate Investment Banker Strain: Not on file  Food Insecurity: Not on file  Transportation Needs: Not on file  Physical Activity: Not on file  Stress: Not on file  Social Connections: Not on file    Allergies:  Allergies  Allergen Reactions   Doxycycline Shortness Of Breath and Rash    Heart racing   Fentanyl Shortness Of Breath, Itching and Other (See Comments)    Heart racing, SOB   Cefoxitin     Unknown reaction   Ceftin [Cefuroxime Axetil]     Unknown reaction   Iodinated Contrast Media Other (See Comments)    Knots on body   Iohexol  Other (See Comments)    Knots on body    Norvasc [Amlodipine Besylate]     Unknown reaction   Trintellix  [Vortioxetine ]     Tremors, heart race   Wellbutrin [Bupropion Hcl] Nausea And Vomiting   Latex Rash   Tape Rash    Metabolic Disorder Labs: Lab Results  Component Value Date   HGBA1C 11.4 (H) 08/03/2020   MPG 280 08/03/2020   MPG 237.43 08/11/2018   No results found for: PROLACTIN Lab Results  Component Value Date   CHOL 153 04/25/2019   TRIG 141  04/25/2019   HDL 37 04/25/2019   CHOLHDL 4.9 03/06/2015   VLDL 16 03/06/2015   LDLCALC 91 04/25/2019   LDLCALC 104 (H) 03/06/2015   Lab Results  Component Value Date   TSH 4.616 (H) 05/23/2022   TSH 2.79 08/03/2020    Therapeutic Level Labs: No results found for: LITHIUM No results found for: VALPROATE No results found for: CBMZ  Current Medications: Current Outpatient Medications  Medication Sig Dispense Refill   ARIPiprazole  (ABILIFY ) 2 MG tablet Take 1 tablet (2 mg total) by mouth at bedtime. 30 tablet 2   ACCU-CHEK GUIDE test strip USE TO CHECK BLOOD SUGAR FOUR TIMES DAILY     Accu-Chek Softclix Lancets lancets CHECK SUGAR ONCE DAILY     amphetamine -dextroamphetamine  (ADDERALL) 20 MG tablet Take 1 tablet (20 mg total) by mouth 2 (two) times daily. 60 tablet 0   amphetamine -dextroamphetamine  (ADDERALL) 20 MG tablet Take 1 tablet (20 mg total) by mouth 2 (two) times daily. 60 tablet 0   amphetamine -dextroamphetamine  (ADDERALL) 20 MG tablet Take 1 tablet (20 mg total) by mouth 2 (two) times daily. 60 tablet 0   atenolol (TENORMIN) 100 MG tablet Take 100 mg by mouth daily.     azaTHIOprine  (IMURAN ) 50 MG tablet Take 1 tablet (50 mg total) by mouth daily. 90 tablet 1   bimatoprost (LUMIGAN) 0.01 % SOLN Place 1 drop into both eyes at bedtime.     Calcium  Citrate-Vitamin D3 (CALCIUM  CITRATE +D) 315-6.25 MG-MCG TABS Take 1 tablet by mouth 2 (two) times daily.     cholecalciferol (VITAMIN D3) 25 MCG (1000 UNIT) tablet Take 2,000 Units by mouth  daily.     cycloSPORINE (RESTASIS) 0.05 % ophthalmic emulsion Place 1 drop into both eyes 2 (two) times daily.     dicyclomine  (BENTYL ) 10 MG capsule Take 1 capsule (10 mg total) by mouth 2 (two) times daily as needed. 60 capsule 2   Dulaglutide  (TRULICITY ) 0.75 MG/0.5ML SOPN Inject 0.75 mg into the skin once a week. 2 mL 3   esomeprazole  (NEXIUM ) 40 MG capsule Take 1 capsule (40 mg total) by mouth daily at 12 noon. 90 capsule 3   FARXIGA  10 MG TABS tablet Take 10 mg by mouth daily.     fexofenadine (ALLEGRA) 180 MG tablet Take 180 mg by mouth daily.     gabapentin  (NEURONTIN ) 100 MG capsule Take 100 mg by mouth 3 (three) times daily.     HYDROcodone-acetaminophen  (NORCO/VICODIN) 5-325 MG tablet Take 1 tablet by mouth 2 (two) times daily as needed for moderate pain. 0.5 -1 po bid prn     hydroxychloroquine  (PLAQUENIL ) 200 MG tablet TAKE 1 TABLET(200 MG) BY MOUTH TWICE DAILY 180 tablet 0   metFORMIN  (GLUCOPHAGE ) 1000 MG tablet Take 1 tablet (1,000 mg total) by mouth 2 (two) times daily with a meal. 60 tablet 3   methocarbamol  (ROBAXIN ) 500 MG tablet Take 500 mg by mouth 2 (two) times daily as needed for muscle spasms.     metoCLOPramide  (REGLAN ) 5 MG tablet Take 1 tablet (5 mg total) by mouth 3 (three) times daily before meals. 210 tablet 3   Multiple Vitamins-Minerals (CENTRUM) tablet Take 1 tablet by mouth daily.     nitroGLYCERIN  (NITROLINGUAL ) 0.4 MG/SPRAY spray Place 1 spray under the tongue every 5 (five) minutes x 3 doses as needed for chest pain.     NP THYROID  60 MG tablet TAKE 1 TABLET(60 MG) BY MOUTH DAILY BEFORE BREAKFAST (Patient taking differently: Take 60 mg by mouth daily before breakfast.) 30 tablet 3   PARoxetine  (PAXIL ) 40 MG tablet Take 1 tablet (40 mg total) by mouth at bedtime. 90 tablet 2   Polyvinyl Alcohol-Povidone (REFRESH OP) Place 1 drop into both eyes daily as needed (dry eyes).     prazosin  (MINIPRESS ) 2 MG capsule TAKE 1 CAPSULE(2 MG) BY MOUTH AT BEDTIME 30 capsule 2   PRESCRIPTION MEDICATION Take 10 mg by mouth 3 (three) times daily before meals. This is MOTILLIUM ( DOMPERIDONE 10 mg) Tablet     PREVIDENT 5000 PLUS 1.1 % CREA dental cream Place 1 Application onto teeth every morning.     Probiotic Product (PROBIOTIC PO) Take 1 capsule by mouth daily.     propafenone (RYTHMOL SR) 225 MG 12 hr capsule Take 225 mg by mouth every 12 (twelve) hours.     REPATHA 140 MG/ML SOSY Inject 1 mL into the skin every  14 (fourteen) days.     rosuvastatin (CRESTOR) 40 MG tablet Take 40 mg by mouth daily.     spironolactone (ALDACTONE) 25 MG tablet Take 25 mg by mouth daily.     No current facility-administered medications for this visit.     Musculoskeletal: Strength & Muscle Tone: within normal limits Gait & Station: normal Patient leans: N/A  Psychiatric Specialty Exam: Review of Systems  Constitutional:  Positive for fatigue.  Psychiatric/Behavioral:  Positive for dysphoric mood.   All other systems reviewed and are negative.   There were no vitals taken for this visit.There is no height or weight on file to calculate BMI.  General Appearance: Casual and Fairly Groomed  Eye Contact:  Good  Speech:  Clear and Coherent  Volume:  Normal  Mood:  Dysphoric  Affect:  Flat  Thought Process:  Goal Directed  Orientation:  Full (Time, Place, and Person)  Thought Content: Rumination   Suicidal Thoughts:  No  Homicidal Thoughts:  No  Memory:  Immediate;   Good Recent;   Good Remote;   NA  Judgement:  Good  Insight:  Good  Psychomotor Activity:  Decreased  Concentration:  Concentration: Good and Attention Span: Good  Recall:  Good  Fund of Knowledge: Good  Language: Good  Akathisia:  No  Handed:  Right  AIMS (if indicated): not done  Assets:  Communication Skills Desire for Improvement Resilience Social Support  ADL's:  Intact  Cognition: WNL  Sleep:  Good   Screenings: GAD-7    Flowsheet Row Office Visit from 05/22/2023 in [facility] [place] Office Visit from 04/23/2023 in [facility] [place] Counselor from 09/04/2022 in [facility] at [place]  Total GAD-7 Score 7 16 19       PHQ2-9    Flowsheet Row Office Visit from 05/22/2023 in [facility] [place] Office Visit from 04/23/2023 in [facility] [place] Counselor  from 09/04/2022 in [facility] [place] Video Visit from 02/08/2022 in [facility] at [place] Video Visit from 11/09/2021 in [facility] Total Score 2 6 6 2  0  PHQ-9 Total Score 6 19 17 7  --      Flowsheet Row Office Visit from 04/23/2023 in [facility] at [place] ED from 10/27/2022 in [facility] Counselor from 09/04/2022 in [facility] at [place]  C-SSRS RISK CATEGORY No Risk No Risk No Risk        Assessment and Plan: This patient is a 53 year old female with a history of PTSD nightmares, depression anxiety and ADHD.  She did have a good response to Paxil  initially so we will continue the 40 mg daily but add Abilify  2 mg daily for augmentation.  She will continue prazosin  2 mg at bedtime for nightmares and Adderall 20 mg twice daily for ADHD.  She will return to see me in 4 weeks  Collaboration of Care: Collaboration of Care: Referral or follow-up with counselor/therapist AEB patient will continue therapy with Jerel Pepper in our office  Patient/Guardian was advised Release of Information must be obtained prior to any record release in order to collaborate their care with an outside provider. Patient/Guardian was advised if they have not already done so to contact the registration department to sign all necessary forms in order for us  to release information regarding their care.   Consent: Patient/Guardian gives verbal consent for treatment and assignment of benefits for services provided during this visit. Patient/Guardian expressed understanding and agreed to proceed.    Barnie Gull, MD 08/14/2023, 1:26 PM

## 2023-08-14 NOTE — Telephone Encounter (Signed)
 Last Fill: 12/25/2022  Eye exam: PLQ Eye Exam 11/14/2022 normal   Labs: 03/06/2023 Glucose 122  Next Visit: 08/31/2023  Last Visit: 03/28/2023  IK:Endpupcz ANA (antinuclear antibody)   Current Dose per office note 03/28/2023: HCQ 400 mg daily   Patient to update labs at upcomming appointment. Contacted the patient regarding the Plaquenil  dose. Patient states she is taking the medication as prescribed, 200 mg twice daily.   Okay to refill Plaquenil ?

## 2023-08-15 ENCOUNTER — Other Ambulatory Visit: Payer: Self-pay | Admitting: Internal Medicine

## 2023-08-15 DIAGNOSIS — M79641 Pain in right hand: Secondary | ICD-10-CM

## 2023-08-15 DIAGNOSIS — R768 Other specified abnormal immunological findings in serum: Secondary | ICD-10-CM

## 2023-08-15 MED ORDER — HYDROXYCHLOROQUINE SULFATE 200 MG PO TABS: ORAL_TABLET | ORAL | 0 refills | Status: DC

## 2023-08-17 NOTE — Progress Notes (Signed)
 Office Visit Note  Patient: Lindsay Walsh             Date of Birth: 02-05-1971           MRN: [id number]             PCP: Eliezer Lofts, MD Referring: Eliezer Lofts, MD Visit Date: 08/31/2023   Subjective:  Follow-up    Discussed the use of AI scribe software for clinical note transcription with the patient, who gave verbal consent to proceed.  History of Present Illness   Lindsay Walsh is a 53 y.o. female here for follow up for seronegative inflammatory arthritis on hydroxychloroquine 400 mg daily also on azathioprine 50 mg daily for autoimmune hepatitis.    She has ongoing issues with her knees, worse in the right knee as before but now also involving the left knee, which is tender and painful. The pain is located around the patella and is associated with crepitus. She has a history of bone spurs at the superior aspect of the patella. The pain sometimes causes her knee to give out, especially when descending stairs, necessitating support. No swelling is noted, but the area is tender. She is starting to walk on her treadmill as part of her physical activity.  She discusses her back issues, mentioning a previous appointment with Dr. Franky Macho who dismissed her concerns due to mild findings on lumbar MRI. She experiences numbness in her left arm and hand, which her cardiologist suggested might be related to her back and neck rather than her heart. She is in the process of seeking a second opinion regarding her back issues.  She is currently taking Plaquenil and Imuran. She is on a low dose of Imuran, taking 50 mg daily, and is concerned about stopping it due to her history of liver failure. Her liver has been stable for years on this medication.    Previous HPI 03/28/2023 Lindsay Walsh is a 53 y.o. female here for follow up for seronegative inflammatory arthritis on hydroxychloroquine 400 mg daily also on azathioprine 50 mg daily for autoimmune hepatitis.  Overall feels  like her arthritis problems are doing pretty well except with right knee pain.  This is aggravating more on some days especially like with weather change and is worse during movements requiring deep bending and getting up from low positions or climbing stairs.  Carpal tunnel symptoms are still doing better she is noticing some partial increase coming back recently.  She saw Dr. Franky Macho had MRI of the lumbar spine there was facet arthropathy noted and has follow-up scheduled may be pursuing local injections for this soon.     Previous HPI 09/25/2022 Lindsay Walsh is a 53 y.o. female here for follow up for seronegative inflammatory arthritis on hydroxychloroquine 400 mg daily.  Her hand and wrist symptoms have remained well improved after steroid injection in September.  However she is having more trouble with some joint pains predominantly on the left side.  Left shoulder is causing pain for months that is persistent she cannot raise it above horizontal without significant pain and frequently gets abrupt pain and awakens her from sleep if she rolls onto the left side at night. She had this evaluated including xray last month and was told it showed calcification. Referred to PT but has not been able to get any scheduled with her insurance so far. In the past 1 month is having more left hip pain on the side of the hip  also gets worse with pressure on that area but also notices it just getting up and down from seated positions not as often while walking.  She has had some persistent bruises on the right arm present for several months neither resolving nor expanding.  She also had heart catheterization with diagnoses of coronary artery disease started on Repatha.  She is scheduled for ophthalmology follow-up next month with [facility].   Previous HPI 03/20/22 Lindsay Walsh is a 53 y.o. female here for follow up for suspected seronegative arthritis with positive RNP Abs and autoimmune hepatitis. She started  taking hydroxychloroquine 200 mg daily for this and feels her symptoms are overall somewhat improved. She still has numbness and joint pains in bilateral hands. Not seeing significant swelling and no rashes. Labs reviewed from 8/21 at [facility] including CBC and LFTs were normal.   Previous HPI 11/16/2021 Lindsay Walsh is a 53 y.o. female here for follow up for abnormal lab findings and continued ongoing joint pain in multiple areas.  After our last visit did not recommend any specific new anti-inflammatory medication.  She feels overall symptoms are doing worse compared to a year ago.  She has pain pretty much all over.  Some days she feels a sensitivity and pain other times feels like she has more numbness or diminished sensation throughout.  She is getting swelling in her hands and feet that is worse by the end of the day.  She keeps persistent fatigue usually regardless of her sleep quality.  Mixed constipation and diarrhea symptoms.  Repeat laboratory testing by her pain management provider concerning for worsening lab markers for connective tissue disease or inflammation.  Liver function test been remaining stable on Imuran.   Previous HPI 06/16/2020 Lindsay Walsh is a 53 y.o. female here for follow up with hand pain and sometimes all over pain, positive RNP antibodies here for repeat assessment for inflammatory joint pain, nailfold capillaroscopy, and positive RNP test. She continues to have pain in multiple places including her hands, knees, also neck and upper back. No signficant changes since her initial visit.   Review of Systems  Constitutional:  Positive for fatigue.  HENT:  Positive for mouth sores and mouth dryness.   Eyes:  Positive for dryness.  Respiratory:  Positive for shortness of breath.   Cardiovascular:  Positive for chest pain and palpitations.  Gastrointestinal:  Positive for constipation and diarrhea. Negative for blood in stool.  Endocrine: Positive for increased  urination.  Genitourinary:  Negative for involuntary urination.  Musculoskeletal:  Positive for joint pain, gait problem, joint pain, joint swelling, myalgias, muscle weakness, morning stiffness, muscle tenderness and myalgias.  Skin:  Positive for hair loss. Negative for color change, rash and sensitivity to sunlight.  Allergic/Immunologic: Positive for susceptible to infections.  Neurological:  Positive for dizziness. Negative for headaches.  Hematological:  Negative for swollen glands.  Psychiatric/Behavioral:  Positive for depressed mood and sleep disturbance. The patient is nervous/anxious.     PMFS History:  Patient Active Problem List   Diagnosis Date Noted   Pain in right knee 03/28/2023   Diabetes mellitus without complication (HCC) 03/27/2023   Liver fibrosis 03/02/2023   Pain in left shoulder 09/25/2022   Progressive angina [facility])    Coronary artery calcification seen on CT scan    History of nuclear stress test    Abnormal nuclear stress test 04/11/2022   Atypical angina (HCC) 04/11/2022   Bilateral carpal tunnel syndrome 04/05/2022   Positive ANA (antinuclear  antibody) 11/16/2021   History of colonic polyps 11/15/2021   Bilateral hand pain 06/02/2020   OAB (overactive bladder) 11/05/2019   Recurrent UTI 11/05/2019   Diabetic gastroparesis (HCC) 08/21/2019   Diarrhea 07/15/2019   Abnormal finding on urinalysis 07/15/2019   Morbid (severe) obesity due to excess calories (HCC)    Major depressive disorder, single episode, unspecified    Restless legs syndrome    Sleep apnea    Fibromyalgia    Hereditary and idiopathic neuropathy, unspecified    Nonspecific mesenteric lymphadenitis    Pain in right foot    Type 2 diabetes mellitus with hyperglycemia (HCC)    Hypothyroidism 08/22/2018   DM type 2 causing vascular disease (HCC) 08/21/2018   Hyperlipidemia associated with type 2 diabetes mellitus (HCC) 08/21/2018   Essential hypertension, benign 08/21/2018   Influenza  A 08/10/2018   SIRS (systemic inflammatory response syndrome) (HCC) 08/10/2018   Cerebral infarction (HCC) 03/05/2015   Dyspareunia 05/06/2014   Obesity 09/30/2013   Bilateral hip pain 07/16/2013   Chronic pain syndrome 07/16/2013   Degenerative disc disease, lumbar 07/16/2013   Greater trochanteric bursitis of both hips 07/16/2013   Nightmares associated with chronic post-traumatic stress disorder 10/18/2012   Sprain of foot 03/14/2012   Weakness 01/29/2012   Irritable bowel syndrome with diarrhea 08/01/2011   Gastroesophageal reflux disease    Depression    Chest pain    Fasting hyperglycemia    TOBACCO ABUSE 04/28/2010   NARCOTIC ABUSE 04/28/2010   PULMONARY NODULE 04/28/2010   Autoimmune hepatitis (HCC) 04/28/2010   Myalgia and myositis 01/06/2009    Past Medical History:  Diagnosis Date   Acid reflux    Allergic rhinitis due to pollen    Anxiety    Arthritis    back   Autoimmune hepatitis (HCC)    Back pain    Connective tissue disease (HCC)    Depression    Dizziness and giddiness    Dyspareunia 05/06/2014   Dysrhythmia    palpatations; on propafenone   Essential (primary) hypertension    Fibromyalgia    Gastroesophageal reflux disease    Chronic abdominal pain; gastroparesis; globus hystericus; irritable bowel syndrome   Glaucoma    Hereditary and idiopathic neuropathy, unspecified    Hyperlipidemia    Irritable bowel syndrome    Major depressive disorder, single episode, unspecified    Morbid (severe) obesity due to excess calories (HCC)    Narcotic dependence (HCC)    Nonspecific mesenteric lymphadenitis    Overweight(278.02)    Pain in limb    Pain in right foot    PTSD (post-traumatic stress disorder)    Pulmonary nodule    Restless legs syndrome    Sleep apnea    on CPAP machine   Stroke (HCC) 2016   Systemic lupus erythematosus (SLE) in adult [facility])    Tobacco abuse    one pack per day; 35 pack years; quit in February 2023   Type 2 diabetes  mellitus with hyperglycemia (HCC)     Family History  Problem Relation Age of Onset   Diabetes Mother    Hypertension Mother    Anxiety disorder Mother    Depression Mother    Drug abuse Mother    Heart disease Father    Diabetes Father    Anxiety disorder Father    Alcohol abuse Father    Depression Father    OCD Father    Thyroid disease Sister    Healthy Brother  Healthy Daughter    Healthy Son    ADD / ADHD Son    Alcohol abuse Paternal Grandfather    Depression Paternal Grandfather    Cancer Paternal Grandfather        lung,skin   Tuberculosis Paternal Grandfather    Seizures Paternal Grandmother    Lupus Paternal Grandmother    ADD / ADHD Son    Anesthesia problems Neg Hx    Malignant hyperthermia Neg Hx    Pseudochol deficiency Neg Hx    Hypotension Neg Hx    Bipolar disorder Neg Hx    Dementia Neg Hx    Paranoid behavior Neg Hx    Schizophrenia Neg Hx    Sexual abuse Neg Hx    Physical abuse Neg Hx    Past Surgical History:  Procedure Laterality Date   ABDOMINAL HYSTERECTOMY     TAH&BSO   BACK SURGERY     BLADDER SUSPENSION  09/12/2011   Procedure: TRANSVAGINAL TAPE (TVT) PROCEDURE;  Surgeon: Ky Barban, MD;  Location: [facility];  Service: Urology;  Laterality: N/A;   CESAREAN SECTION     X3   CHOLECYSTECTOMY     CORONARY PRESSURE/FFR STUDY N/A 04/21/2022   Procedure: INTRAVASCULAR PRESSURE WIRE/FFR STUDY;  Surgeon: Marykay Lex, MD;  Location: [facility] INVASIVE CV LAB;  Service: Cardiovascular;  Laterality: N/A;   ESOPHAGOGASTRODUODENOSCOPY ENDOSCOPY     "throat stretched" per pt.   LEFT HEART CATH AND CORONARY ANGIOGRAPHY N/A 04/21/2022   Procedure: LEFT HEART CATH AND CORONARY ANGIOGRAPHY;  Surgeon: Marykay Lex, MD;  Location: [facility] INVASIVE CV LAB;  Service: Cardiovascular;  Laterality: N/A;   LIVER BIOPSY     x4   LUMBAR EPIDURAL INJECTION  01/2012   TOTAL ABDOMINAL HYSTERECTOMY W/ BILATERAL SALPINGOOPHORECTOMY     TUBAL LIGATION      Bilateral   UPPER GASTROINTESTINAL ENDOSCOPY  09/13/2010   Social History   Social History Narrative   Divorced since 1994.Lives with boyfriend of 11 years.On disability.   Immunization History  Administered Date(s) Administered   Hepatitis A, Adult 06/24/2020, 02/27/2022   Influenza Inj Mdck Quad Pf 04/20/2016   Influenza Split 04/07/2013, 05/17/2017   Influenza,inj,Quad PF,6+ Mos 05/17/2017, 04/04/2018, 03/31/2019, 04/28/2019   Influenza-Unspecified 04/17/2012, 03/20/2014, 08/09/2014, 05/06/2015, 06/24/2020   Moderna Sars-Covid-2 Vaccination 02/06/2020, 03/05/2020   Pneumococcal Conjugate-13 12/18/2019   Pneumococcal Polysaccharide-23 04/20/2016     Objective: Vital Signs: BP 136/84 (BP Location: Left Arm, Patient Position: Sitting, Cuff Size: Large)   Pulse (!) 101   Resp 14   Ht 5' (1.524 m)   Wt 201 lb (91.2 kg)   BMI 39.26 kg/m    Physical Exam   Musculoskeletal Exam:   Physical Exam   MUSCULOSKELETAL: Tenderness on palpation and crepitus in the left knee.       Investigation: No additional findings.  Imaging: XR KNEE 3 VIEW LEFT Result Date: 08/31/2023 X-ray left knee 3 views There is very slight medial compartment joint space narrowing and probable early marginal osteophyte on femur.  Lateral compartment is preserved.  Patellofemoral joint space is normal with with small lateral osteophyte.  There is a superior patellar enthesophyte.  No visible effusion or abnormal calcifications. Impression Mild medial and patellofemoral compartment osteoarthritis, enthesophyte may also indicate chronic or quadriceps tendonitis   Recent Labs: Lab Results  Component Value Date   WBC 7.6 03/06/2023   HGB 12.5 03/06/2023   PLT 267 03/06/2023   NA 141 03/06/2023   K 4.5 03/06/2023  CL 103 03/06/2023   CO2 25 03/06/2023   GLUCOSE 122 (H) 03/06/2023   BUN 9 03/06/2023   CREATININE 0.87 03/06/2023   BILITOT 0.4 03/06/2023   ALKPHOS 105 03/06/2023   AST 18 03/06/2023    ALT 12 03/06/2023   PROT 6.4 03/06/2023   ALBUMIN 4.1 03/06/2023   CALCIUM 9.4 03/06/2023   GFRAA 101 08/03/2020    Speciality Comments: PLQ Eye Exam 11/14/2022 normal Dr. Dione Booze f/u 6 months  Procedures:  Large Joint Inj: bilateral knee on 08/31/2023 12:20 PM Indications: pain Details: 27 G 1.5 in needle, lateral approach Medications (Right): 2 mL lidocaine 1 %; 40 mg triamcinolone acetonide 40 MG/ML Medications (Left): 2 mL lidocaine 1 %; 40 mg triamcinolone acetonide 40 MG/ML Procedure, treatment alternatives, risks and benefits explained, specific risks discussed. Consent was given by the patient. Immediately prior to procedure a time out was called to verify the correct patient, procedure, equipment, support staff and site/side marked as required. Patient was prepped and draped in the usual sterile fashion.     Allergies: Doxycycline, Fentanyl, Cefoxitin, Ceftin [cefuroxime axetil], Iodinated contrast media, Iohexol, Norvasc [amlodipine besylate], Trintellix [vortioxetine], Wellbutrin [bupropion hcl], Latex, and Tape   Assessment / Plan:     Visit Diagnoses: Positive ANA (antinuclear antibody)  Seronegative inflammatory arthritis- Plan: CBC with Differential/Platelet, hydroxychloroquine (PLAQUENIL) 200 MG tablet Autoimmune hepatitis (HCC) - Plan: COMPLETE METABOLIC PANEL WITH GFR Stable on Imuran 50mg  daily. Discussed potential increased risk of malignancy with long-term use of azathioprine. -Checking sedimentation rate for disease activity monitoring -Continue hydroxychloroquine 400 mg daily -Consider discontinuing Imuran if labs remain normal  High risk medication use - hydroxychloroquine 400 mg daily and continues azathioprine 50 mg daily. PLQ Eye Exam 11/14/2022 normal - Plan: COMPLETE METABOLIC PANEL WITH GFR, Sedimentation rate -Checking CMP for medication monitoring on continued use of hydroxychloroquine and Imuran  Knee Pain Bilateral knee pain with bone spurs noted on the  right knee. Left knee pain also reported, but less severe.  X-ray shows osteoarthritis as well as large patellar enthesophyte similar as on the right knee.  We discussed treatment options she is not a good candidate for use of oral NSAIDs with history of liver disease. -Provide printout of exercises to strengthen legs. -Proceeded with intra-articular steroid injection both knees today. -Referral to physical therapy.  Back Pain Patient reports dissatisfaction with previous specialist's assessment and is seeking a second opinion. Numbness in left arm reported, which patient's cardiologist believes may be related to back issues.   Orders: Orders Placed This Encounter  Procedures   Large Joint Inj   XR KNEE 3 VIEW LEFT   CBC with Differential/Platelet   COMPLETE METABOLIC PANEL WITH GFR   Sedimentation rate   Ambulatory referral to Physical Therapy   Meds ordered this encounter  Medications   hydroxychloroquine (PLAQUENIL) 200 MG tablet    Sig: Take 1 tablet (200 mg total) by mouth 2 (two) times daily. TAKE 1 TABLET(200 MG) BY MOUTH TWICE DAILY    Dispense:  180 tablet    Refill:  0     Follow-Up Instructions: Return in about 3 months (around 11/28/2023) for RA/AIH on HCQ f/u 3 mos.   Fuller Plan, MD  Note - This record has been created using [employer].  Chart creation errors have been sought, but may not always  have been located. Such creation errors do not reflect on  the standard of medical care.

## 2023-08-27 ENCOUNTER — Ambulatory Visit (INDEPENDENT_AMBULATORY_CARE_PROVIDER_SITE_OTHER): Payer: 59 | Admitting: Clinical

## 2023-08-27 DIAGNOSIS — F900 Attention-deficit hyperactivity disorder, predominantly inattentive type: Secondary | ICD-10-CM

## 2023-08-27 DIAGNOSIS — F4310 Post-traumatic stress disorder, unspecified: Secondary | ICD-10-CM | POA: Diagnosis not present

## 2023-08-27 DIAGNOSIS — F331 Major depressive disorder, recurrent, moderate: Secondary | ICD-10-CM

## 2023-08-27 DIAGNOSIS — F419 Anxiety disorder, unspecified: Secondary | ICD-10-CM

## 2023-08-27 NOTE — Progress Notes (Signed)
 Virtual Visit via Telephone Note  I connected with Lindsay Walsh on 08/27/23 at  3:00 PM EST by telephone and verified that I am speaking with the correct person using two identifiers.  Location: Patient: Home Provider: Office   I discussed the limitations, risks, security and privacy concerns of performing an evaluation and management service by telephone and the availability of in person appointments. I also discussed with the patient that there may be a patient responsible charge related to this service. The patient expressed understanding and agreed to proceed.      THERAPIST PROGRESS NOTE   Session Time: 3:00 PM-3:25 PM   Participation Level: Active   Behavioral Response: CasualAlertDepressed   Type of Therapy: Individual Therapy   Treatment Goals addressed: Coping for MH diagnoses   Interventions: CBT   Summary: [name] is a 53 y.o. female who presents with Depression, ADHD, PTSD  . The OPT therapist worked with the patient for her ongoing OPT treatment session. The OPT therapist utilized Motivational Interviewing to assist in creating therapeutic repore. The patient in the session was engaged and work in collaboration giving feedback about her triggers and symptoms over the past few weeks. The patient spoke about adjustment with her med therapy adding medication she will be starting tonight and doing a trial run of a add on medication.The patient spoke about her experiences and interactions over the  with family these were reported to go well. The patient spoke the impact of getting out of the home when she can making her feel better. The patient spoke about her large goal of getting involved with the local [employer]. The OPT therapist utilized Cognitive Behavioral Therapy through cognitive restructuring as well as worked with the patient on coping strategies to assist in management of mood. The patient spoke about upcoming liver appointment at [facility]. Additionally the patient  starting Osempic to help her lose weight and decrease her Cardiovascular risks.   Suicidal/Homicidal: Nowithout intent/plan   Therapist Response: The OPT therapist worked with the patient for the patients scheduled session. The patient was engaged in her session and gave feedback in relation to triggers, symptoms, and behavior responses over the past few weeks. The OPT therapist worked with the patient utilizing an in session Cognitive Behavioral Therapy exercise. The patient was responsive in the session and verbalized," My goal right now is to get myself to the local [employer]". The OPT therapist worked with the patient on placing her energy/focus/and attention in areas of her life she has control over and doing her self check ins throughout the week. The OPT therapist promoted the patient being active within her physical health limitations and change environment when possible once we have better/ warmer outside weather. The patient spoke about starting Osempic for weight management along with a add on medication to help better manage her Depression.The OPT therapist will continue treatment work with the patient in her next scheduled session   Plan: Return again in 3 weeks.   Diagnosis       Axis I: Recurrent Moderate MDD with anxiety, ADHD inattentive type, PTSD.                             Axis II: No diagnosis     Collaboration of Care: Overview of patient involvement with Dr. Tenny Craw in the med therapy program .   Patient/Guardian was advised Release of Information must be obtained prior to any record release in order  to collaborate their care with an outside provider. Patient/Guardian was advised if they have not already done so to contact the registration department to sign all necessary forms in order for [place] to release information regarding their care.    Consent: Patient/Guardian gives verbal consent for treatment and assignment of benefits for services provided during this visit. Patient/Guardian  expressed understanding and agreed to proceed.    I discussed the assessment and treatment plan with the patient. The patient was provided an opportunity to ask questions and all were answered. The patient agreed with the plan and demonstrated an understanding of the instructions.   The patient was advised to call back or seek an in-person evaluation if the symptoms worsen or if the condition fails to improve as anticipated.   I provided 25 minutes of non-face-to-face time during this encounter.   Winfred Burn, LCSW   08/27/2023

## 2023-08-31 ENCOUNTER — Ambulatory Visit: Payer: 59

## 2023-08-31 ENCOUNTER — Encounter: Payer: Self-pay | Admitting: Internal Medicine

## 2023-08-31 ENCOUNTER — Ambulatory Visit: Payer: 59 | Attending: Internal Medicine | Admitting: Internal Medicine

## 2023-08-31 VITALS — BP 136/84 | HR 101 | Resp 14 | Ht 60.0 in | Wt 201.0 lb

## 2023-08-31 DIAGNOSIS — K754 Autoimmune hepatitis: Secondary | ICD-10-CM | POA: Diagnosis not present

## 2023-08-31 DIAGNOSIS — M25562 Pain in left knee: Secondary | ICD-10-CM

## 2023-08-31 DIAGNOSIS — G5603 Carpal tunnel syndrome, bilateral upper limbs: Secondary | ICD-10-CM | POA: Diagnosis not present

## 2023-08-31 DIAGNOSIS — G8929 Other chronic pain: Secondary | ICD-10-CM

## 2023-08-31 DIAGNOSIS — M79642 Pain in left hand: Secondary | ICD-10-CM

## 2023-08-31 DIAGNOSIS — M25561 Pain in right knee: Secondary | ICD-10-CM | POA: Diagnosis not present

## 2023-08-31 DIAGNOSIS — M79641 Pain in right hand: Secondary | ICD-10-CM

## 2023-08-31 DIAGNOSIS — Z79899 Other long term (current) drug therapy: Secondary | ICD-10-CM

## 2023-08-31 DIAGNOSIS — R768 Other specified abnormal immunological findings in serum: Secondary | ICD-10-CM

## 2023-08-31 MED ORDER — HYDROXYCHLOROQUINE SULFATE 200 MG PO TABS
200.0000 mg | ORAL_TABLET | Freq: Two times a day (BID) | ORAL | 0 refills | Status: DC
Start: 2023-08-31 — End: 2024-05-08

## 2023-08-31 NOTE — Patient Instructions (Addendum)
 Knee Exercises Ask your health care provider which exercises are safe for you. Do exercises exactly as told by your health care provider and adjust them as directed. It is normal to feel mild stretching, pulling, tightness, or discomfort as you do these exercises. Stop right away if you feel sudden pain or your pain gets worse. Do not begin these exercises until told by your health care provider. Stretching and range-of-motion exercise This exercise warms up your muscles and joints and improves the movement and flexibility of your knee. The exercise also helps to relieve pain and stiffness. Hamstring, doorway stretch This is an exercise in which you lie in a doorway and prop your leg on a wall to stretch the back of your knee and thigh (hamstring). Lie on your back in front of a doorway with your left / right leg resting against the wall and your other leg flat on the floor in the doorway. There should be a slight bend in your left / right knee. Straighten your left / right knee. You should feel a stretch behind your knee or thigh. If you do not, scoot your buttocks closer to the door. Hold this position for __________ seconds. Repeat __________ times. Complete this exercise __________ times a day. Strengthening exercises These exercises build strength and endurance in your knee. Endurance is the ability to use your muscles for a long time, even after they get tired.  Straight leg lift  Lie on your back with one knee bent and the other leg out straight. Slowly lift the straight leg without bending the knee. Lift until your foot is about 12 inches (30 cm) off the floor. Hold for 3-5 seconds and slowly lower your leg. Repeat __________ times. Complete this exercise __________ times a day.  Squats This is a weight-bearing exercise in which you bend your knees and lower your hips while engaging your thigh muscles. Stand in front of a table, with your feet and knees pointing straight ahead. You may  rest your hands on the table for balance but not for support. Slowly bend your knees and lower your hips like you are going to sit in a chair. Keep your weight over your heels, not over your toes. Keep your lower legs upright so they are parallel with the table legs. Do not let your hips go lower than your knees. Do not bend lower than told by your health care provider. If your knee pain increases, do not bend as low. Hold the squat position for __________ seconds. Slowly push with your legs to return to standing. Do not use your hands to pull yourself to standing. Repeat __________ times. Complete this exercise __________ times a day.  Straight leg raises, abductors This exercise strengthens the muscles that rotate the leg at the hip and move it away from your body (hip abductors). Lie on your side with your left / right leg in the top position. Lie so your head, shoulder, knee, and hip line up. You may bend your bottom knee to help you keep your balance. Roll your hips slightly forward, so that your hips are stacked directly over each other and your left / right knee is facing forward. Leading with your heel, lift your top leg 4-6 inches (10-15 cm). You should feel the muscles in your outer hip lifting. Do not let your foot drift forward. Do not let your knee roll toward the ceiling. Hold this position for __________ seconds. Slowly lower your leg to the starting position. Let  your muscles relax completely after each repetition. Repeat __________ times. Complete this exercise __________ times a day.

## 2023-09-01 LAB — COMPLETE METABOLIC PANEL WITH GFR
AG Ratio: 1.8 (calc) (ref 1.0–2.5)
ALT: 20 U/L (ref 6–29)
AST: 22 U/L (ref 10–35)
Albumin: 4.4 g/dL (ref 3.6–5.1)
Alkaline phosphatase (APISO): 84 U/L (ref 37–153)
BUN: 9 mg/dL (ref 7–25)
CO2: 27 mmol/L (ref 20–32)
Calcium: 9.5 mg/dL (ref 8.6–10.4)
Chloride: 102 mmol/L (ref 98–110)
Creat: 0.88 mg/dL (ref 0.50–1.03)
Globulin: 2.5 g/dL (ref 1.9–3.7)
Glucose, Bld: 125 mg/dL — ABNORMAL HIGH (ref 65–99)
Potassium: 4.9 mmol/L (ref 3.5–5.3)
Sodium: 139 mmol/L (ref 135–146)
Total Bilirubin: 0.4 mg/dL (ref 0.2–1.2)
Total Protein: 6.9 g/dL (ref 6.1–8.1)
eGFR: 79 mL/min/{1.73_m2} (ref >=60)

## 2023-09-01 LAB — CBC WITH DIFFERENTIAL/PLATELET
Absolute Lymphocytes: 1118 {cells}/uL (ref 850–3900)
Absolute Monocytes: 851 {cells}/uL (ref 200–950)
Basophils Absolute: 49 {cells}/uL (ref 0–200)
Basophils Relative: 0.6 %
Eosinophils Absolute: 170 {cells}/uL (ref 15–500)
Eosinophils Relative: 2.1 %
HCT: 40.7 % (ref 35.0–45.0)
Hemoglobin: 13.4 g/dL (ref 11.7–15.5)
MCH: 29.5 pg (ref 27.0–33.0)
MCHC: 32.9 g/dL (ref 32.0–36.0)
MCV: 89.5 fL (ref 80.0–100.0)
MPV: 10.3 fL (ref 7.5–12.5)
Monocytes Relative: 10.5 %
Neutro Abs: 5913 {cells}/uL (ref 1500–7800)
Neutrophils Relative %: 73 %
Platelets: 263 10*3/uL (ref 140–400)
RBC: 4.55 10*6/uL (ref 3.80–5.10)
RDW: 13 % (ref 11.0–15.0)
Total Lymphocyte: 13.8 %
WBC: 8.1 10*3/uL (ref 3.8–10.8)

## 2023-09-01 LAB — SEDIMENTATION RATE: Sed Rate: 9 mm/h (ref 0–30)

## 2023-09-01 MED ORDER — TRIAMCINOLONE ACETONIDE 40 MG/ML IJ SUSP
40.0000 mg | INTRAMUSCULAR | Status: AC | PRN
  Administered 2023-08-31: 40 mg via INTRA_ARTICULAR

## 2023-09-01 MED ORDER — LIDOCAINE HCL 1 % IJ SOLN
2.0000 mL | INTRAMUSCULAR | Status: AC | PRN
  Administered 2023-08-31: 2 mL

## 2023-09-11 ENCOUNTER — Encounter (HOSPITAL_COMMUNITY): Payer: Self-pay | Admitting: Psychiatry

## 2023-09-11 ENCOUNTER — Telehealth (HOSPITAL_COMMUNITY): Payer: 59 | Admitting: Psychiatry

## 2023-09-11 DIAGNOSIS — F515 Nightmare disorder: Secondary | ICD-10-CM

## 2023-09-11 DIAGNOSIS — F32A Depression, unspecified: Secondary | ICD-10-CM

## 2023-09-11 DIAGNOSIS — F4310 Post-traumatic stress disorder, unspecified: Secondary | ICD-10-CM

## 2023-09-11 DIAGNOSIS — F900 Attention-deficit hyperactivity disorder, predominantly inattentive type: Secondary | ICD-10-CM

## 2023-09-11 DIAGNOSIS — F4312 Post-traumatic stress disorder, chronic: Secondary | ICD-10-CM

## 2023-09-11 MED ORDER — ARIPIPRAZOLE 2 MG PO TABS: 2.0000 mg | ORAL_TABLET | Freq: Every day | ORAL | 2 refills | Status: DC

## 2023-09-11 MED ORDER — PRAZOSIN HCL 2 MG PO CAPS: ORAL_CAPSULE | ORAL | 2 refills | Status: DC

## 2023-09-11 MED ORDER — AMPHETAMINE-DEXTROAMPHETAMINE 20 MG PO TABS: 20.0000 mg | ORAL_TABLET | Freq: Two times a day (BID) | ORAL | 0 refills | Status: DC

## 2023-09-11 MED ORDER — PAROXETINE HCL 40 MG PO TABS: 40.0000 mg | ORAL_TABLET | Freq: Every day | ORAL | 2 refills | Status: DC

## 2023-09-11 NOTE — Progress Notes (Signed)
 Virtual Visit via Video Note  I connected with Lindsay Walsh on 09/11/23 at  1:20 PM EST by a video enabled telemedicine application and verified that I am speaking with the correct person using two identifiers.  Location: Patient: home Provider: office   I discussed the limitations of evaluation and management by telemedicine and the availability of in person appointments. The patient expressed understanding and agreed to proceed.    I discussed the assessment and treatment plan with the patient. The patient was provided an opportunity to ask questions and all were answered. The patient agreed with the plan and demonstrated an understanding of the instructions.   The patient was advised to call back or seek an in-person evaluation if the symptoms worsen or if the condition fails to improve as anticipated.  I provided 15 minutes of non-face-to-face time during this encounter.   Diannia Ruder, MD  [facility] MD/PA/NP OP Progress Note  09/11/2023 1:32 PM Lindsay Walsh  MRN:  [id number]  Chief Complaint:  Chief Complaint  Patient presents with   Anxiety   Depression   ADD   HPI:  This patient is a 53 year old divorced white female who lives with her boyfriend in [place]. She is on disability for an autoimmune liver disease. She has 3 children and 1 granddaughter.   The patient returns for follow-up after 4 weeks regarding her depression anxiety PTSD and ADD.  Last time she stated that she was feeling more depressed again.  This was after we had switched Cymbalta to Paxil and initially she was feeling better.  Last visit I added Abilify 2 mg daily.  She states that this is made a big difference her mood is much better.  Her energy is improved and she is getting up and doing more.  She is still sleeping well.  She is going to have to have a liver biopsy but other than that nothing is changed in her health status or medications.  She denies any thoughts of self-harm.  She does appear to  be brighter she has had 1 nightmare in the intervening time. Visit Diagnosis:    ICD-10-CM   1. PTSD (post-traumatic stress disorder)  F43.10     2. Nightmares associated with chronic post-traumatic stress disorder  F51.5 prazosin (MINIPRESS) 2 MG capsule   F43.12     3. Attention deficit hyperactivity disorder (ADHD), predominantly inattentive type  F90.0     4. Depression, unspecified depression type  F32.A       Past Psychiatric History: Hospitalization in her early 32s for depression  Past Medical History:  Past Medical History:  Diagnosis Date   Acid reflux    Allergic rhinitis due to pollen    Anxiety    Arthritis    back   Autoimmune hepatitis (HCC)    Back pain    Connective tissue disease (HCC)    Depression    Dizziness and giddiness    Dyspareunia 05/06/2014   Dysrhythmia    palpatations; on propafenone   Essential (primary) hypertension    Fibromyalgia    Gastroesophageal reflux disease    Chronic abdominal pain; gastroparesis; globus hystericus; irritable bowel syndrome   Glaucoma    Hereditary and idiopathic neuropathy, unspecified    Hyperlipidemia    Irritable bowel syndrome    Major depressive disorder, single episode, unspecified    Morbid (severe) obesity due to excess calories (HCC)    Narcotic dependence (HCC)    Nonspecific mesenteric lymphadenitis    Overweight(278.02)  Pain in limb    Pain in right foot    PTSD (post-traumatic stress disorder)    Pulmonary nodule    Restless legs syndrome    Sleep apnea    on CPAP machine   Stroke (HCC) 2016   Systemic lupus erythematosus (SLE) in adult [facility])    Tobacco abuse    one pack per day; 35 pack years; quit in February 2023   Type 2 diabetes mellitus with hyperglycemia (HCC)     Past Surgical History:  Procedure Laterality Date   ABDOMINAL HYSTERECTOMY     TAH&BSO   BACK SURGERY     BLADDER SUSPENSION  09/12/2011   Procedure: TRANSVAGINAL TAPE (TVT) PROCEDURE;  Surgeon: Ky Barban, MD;  Location: [facility];  Service: Urology;  Laterality: N/A;   CESAREAN SECTION     X3   CHOLECYSTECTOMY     CORONARY PRESSURE/FFR STUDY N/A 04/21/2022   Procedure: INTRAVASCULAR PRESSURE WIRE/FFR STUDY;  Surgeon: Marykay Lex, MD;  Location: [facility] INVASIVE CV LAB;  Service: Cardiovascular;  Laterality: N/A;   ESOPHAGOGASTRODUODENOSCOPY ENDOSCOPY     "throat stretched" per pt.   LEFT HEART CATH AND CORONARY ANGIOGRAPHY N/A 04/21/2022   Procedure: LEFT HEART CATH AND CORONARY ANGIOGRAPHY;  Surgeon: Marykay Lex, MD;  Location: [facility] INVASIVE CV LAB;  Service: Cardiovascular;  Laterality: N/A;   LIVER BIOPSY     x4   LUMBAR EPIDURAL INJECTION  01/2012   TOTAL ABDOMINAL HYSTERECTOMY W/ BILATERAL SALPINGOOPHORECTOMY     TUBAL LIGATION     Bilateral   UPPER GASTROINTESTINAL ENDOSCOPY  09/13/2010    Family Psychiatric History: See below  Family History:  Family History  Problem Relation Age of Onset   Diabetes Mother    Hypertension Mother    Anxiety disorder Mother    Depression Mother    Drug abuse Mother    Heart disease Father    Diabetes Father    Anxiety disorder Father    Alcohol abuse Father    Depression Father    OCD Father    Thyroid disease Sister    Healthy Brother    Healthy Daughter    Healthy Son    ADD / ADHD Son    Alcohol abuse Paternal Grandfather    Depression Paternal Grandfather    Cancer Paternal Grandfather        lung,skin   Tuberculosis Paternal Grandfather    Seizures Paternal Grandmother    Lupus Paternal Grandmother    ADD / ADHD Son    Anesthesia problems Neg Hx    Malignant hyperthermia Neg Hx    Pseudochol deficiency Neg Hx    Hypotension Neg Hx    Bipolar disorder Neg Hx    Dementia Neg Hx    Paranoid behavior Neg Hx    Schizophrenia Neg Hx    Sexual abuse Neg Hx    Physical abuse Neg Hx     Social History:  Social History   Socioeconomic History   Marital status: Divorced    Spouse name: Not on file   Number of  children: 3   Years of education: GED   Highest education level: Not on file  Occupational History    Employer: NOT EMPLOYED  Tobacco Use   Smoking status: Former    Current packs/day: 0.00    Average packs/day: 1.5 packs/day for 25.0 years (37.5 ttl pk-yrs)    Types: Cigarettes    Start date: 04/25/1988    Quit date: 04/25/2013  Years since quitting: 10.3    Passive exposure: Never   Smokeless tobacco: Never  Vaping Use   Vaping status: Every Day   Start date: 04/22/2013   Substances: Nicotine, Flavoring  Substance and Sexual Activity   Alcohol use: No   Drug use: No   Sexual activity: Yes    Birth control/protection: Surgical, Abstinence  Other Topics Concern   Not on file  Social History Narrative   Divorced since 1994.Lives with boyfriend of 11 years.On disability.   Social Drivers of Corporate investment banker Strain: Not on file  Food Insecurity: Not on file  Transportation Needs: Not on file  Physical Activity: Not on file  Stress: Not on file  Social Connections: Not on file    Allergies:  Allergies  Allergen Reactions   Doxycycline Shortness Of Breath and Rash    Heart racing   Fentanyl Shortness Of Breath, Itching and Other (See Comments)    Heart racing, SOB   Cefoxitin     Unknown reaction   Ceftin [Cefuroxime Axetil]     Unknown reaction   Iodinated Contrast Media Other (See Comments)    Knots on body   Iohexol Other (See Comments)    Knots on body    Norvasc [Amlodipine Besylate]     Unknown reaction   Trintellix [Vortioxetine]     Tremors, heart race   Wellbutrin [Bupropion Hcl] Nausea And Vomiting   Latex Rash   Tape Rash    Metabolic Disorder Labs: Lab Results  Component Value Date   HGBA1C 11.4 (H) 08/03/2020   MPG 280 08/03/2020   MPG 237.43 08/11/2018   No results found for: "PROLACTIN" Lab Results  Component Value Date   CHOL 153 04/25/2019   TRIG 141 04/25/2019   HDL 37 04/25/2019   CHOLHDL 4.9 03/06/2015   VLDL 16  03/06/2015   LDLCALC 91 04/25/2019   LDLCALC 104 (H) 03/06/2015   Lab Results  Component Value Date   TSH 4.616 (H) 05/23/2022   TSH 2.79 08/03/2020    Therapeutic Level Labs: No results found for: "LITHIUM" No results found for: "VALPROATE" No results found for: "CBMZ"  Current Medications: Current Outpatient Medications  Medication Sig Dispense Refill   ACCU-CHEK GUIDE test strip USE TO CHECK BLOOD SUGAR FOUR TIMES DAILY     Accu-Chek Softclix Lancets lancets CHECK SUGAR ONCE DAILY     amphetamine-dextroamphetamine (ADDERALL) 20 MG tablet Take 1 tablet (20 mg total) by mouth 2 (two) times daily. 60 tablet 0   amphetamine-dextroamphetamine (ADDERALL) 20 MG tablet Take 1 tablet (20 mg total) by mouth 2 (two) times daily. 60 tablet 0   amphetamine-dextroamphetamine (ADDERALL) 20 MG tablet Take 1 tablet (20 mg total) by mouth 2 (two) times daily. 60 tablet 0   ARIPiprazole (ABILIFY) 2 MG tablet Take 1 tablet (2 mg total) by mouth at bedtime. 30 tablet 2   atenolol (TENORMIN) 100 MG tablet Take 100 mg by mouth daily. (Patient not taking: Reported on 08/31/2023)     azaTHIOprine (IMURAN) 50 MG tablet Take 1 tablet (50 mg total) by mouth daily. 90 tablet 1   bimatoprost (LUMIGAN) 0.01 % SOLN Place 1 drop into both eyes at bedtime.     Calcium Citrate-Vitamin D3 (CALCIUM CITRATE +D) 315-6.25 MG-MCG TABS Take 1 tablet by mouth 2 (two) times daily.     cholecalciferol (VITAMIN D3) 25 MCG (1000 UNIT) tablet Take 2,000 Units by mouth daily.     cycloSPORINE (RESTASIS) 0.05 % ophthalmic emulsion  Place 1 drop into both eyes 2 (two) times daily.     dicyclomine (BENTYL) 10 MG capsule Take 1 capsule (10 mg total) by mouth 2 (two) times daily as needed. 60 capsule 2   Dulaglutide (TRULICITY) 0.75 MG/0.5ML SOPN Inject 0.75 mg into the skin once a week. (Patient not taking: Reported on 08/31/2023) 2 mL 3   esomeprazole (NEXIUM) 40 MG capsule Take 1 capsule (40 mg total) by mouth daily at 12 noon. 90  capsule 3   FARXIGA 10 MG TABS tablet Take 10 mg by mouth daily.     fexofenadine (ALLEGRA) 180 MG tablet Take 180 mg by mouth daily.     gabapentin (NEURONTIN) 100 MG capsule Take 100 mg by mouth 3 (three) times daily.     HYDROcodone-acetaminophen (NORCO/VICODIN) 5-325 MG tablet Take 1 tablet by mouth 2 (two) times daily as needed for moderate pain. 0.5 -1 po bid prn     hydroxychloroquine (PLAQUENIL) 200 MG tablet Take 1 tablet (200 mg total) by mouth 2 (two) times daily. TAKE 1 TABLET(200 MG) BY MOUTH TWICE DAILY 180 tablet 0   isosorbide mononitrate (IMDUR) 30 MG 24 hr tablet Take 30 mg by mouth every morning.     lisinopril (ZESTRIL) 40 MG tablet Take 40 mg by mouth daily.     metFORMIN (GLUCOPHAGE) 1000 MG tablet Take 1 tablet (1,000 mg total) by mouth 2 (two) times daily with a meal. 60 tablet 3   methocarbamol (ROBAXIN) 500 MG tablet Take 500 mg by mouth 2 (two) times daily as needed for muscle spasms.     metoCLOPramide (REGLAN) 5 MG tablet Take 1 tablet (5 mg total) by mouth 3 (three) times daily before meals. 210 tablet 3   Multiple Vitamins-Minerals (CENTRUM) tablet Take 1 tablet by mouth daily.     nitroGLYCERIN (NITROLINGUAL) 0.4 MG/SPRAY spray Place 1 spray under the tongue every 5 (five) minutes x 3 doses as needed for chest pain.     NP THYROID 60 MG tablet TAKE 1 TABLET(60 MG) BY MOUTH DAILY BEFORE BREAKFAST (Patient taking differently: Take 60 mg by mouth daily before breakfast.) 30 tablet 3   OZEMPIC, 1 MG/DOSE, 4 MG/3ML SOPN Inject 1 mg into the skin once a week.     PARoxetine (PAXIL) 40 MG tablet Take 1 tablet (40 mg total) by mouth at bedtime. 90 tablet 2   Polyvinyl Alcohol-Povidone (REFRESH OP) Place 1 drop into both eyes daily as needed (dry eyes).     prazosin (MINIPRESS) 2 MG capsule TAKE 1 CAPSULE(2 MG) BY MOUTH AT BEDTIME 30 capsule 2   PRESCRIPTION MEDICATION Take 10 mg by mouth 3 (three) times daily before meals. This is MOTILLIUM ( DOMPERIDONE 10 mg) Tablet      PREVIDENT 5000 PLUS 1.1 % CREA dental cream Place 1 Application onto teeth every morning.     Probiotic Product (PROBIOTIC PO) Take 1 capsule by mouth daily.     propafenone (RYTHMOL SR) 225 MG 12 hr capsule Take 225 mg by mouth every 12 (twelve) hours.     REPATHA 140 MG/ML SOSY Inject 1 mL into the skin every 14 (fourteen) days.     rosuvastatin (CRESTOR) 40 MG tablet Take 40 mg by mouth daily.     spironolactone (ALDACTONE) 25 MG tablet Take 25 mg by mouth daily.     No current facility-administered medications for this visit.     Musculoskeletal: Strength & Muscle Tone: within normal limits Gait & Station: normal Patient leans: N/A  Psychiatric Specialty Exam: Review of Systems  Constitutional:  Positive for fatigue.  All other systems reviewed and are negative.   There were no vitals taken for this visit.There is no height or weight on file to calculate BMI.  General Appearance: Casual and Fairly Groomed  Eye Contact:  Good  Speech:  Clear and Coherent  Volume:  Normal  Mood:  Euthymic  Affect:  Congruent  Thought Process:  Goal Directed  Orientation:  Full (Time, Place, and Person)  Thought Content: WDL   Suicidal Thoughts:  No  Homicidal Thoughts:  No  Memory:  Immediate;   Good Recent;   Good Remote;   Good  Judgement:  Good  Insight:  Fair  Psychomotor Activity:  Decreased  Concentration:  Concentration: Good and Attention Span: Good  Recall:  Good  Fund of Knowledge: Good  Language: Good  Akathisia:  No  Handed:  Right  AIMS (if indicated): not done  Assets:  Communication Skills Desire for Improvement Resilience Social Support Talents/Skills  ADL's:  Intact  Cognition: WNL  Sleep:  Good   Screenings: GAD-7    Flowsheet Row Office Visit from 05/22/2023 in [facility] [place] Office Visit from 04/23/2023 in [facility] [place] Counselor from 09/04/2022 in [facility] at [place]  Total GAD-7 Score 7 16 19       PHQ2-9    Flowsheet Row Office Visit from 05/22/2023 in [facility] at [place] Office Visit from 04/23/2023 in [facility] at [place] Counselor from 09/04/2022 in [facility] at [place] Video Visit from 02/08/2022 in [facility] at [place] Video Visit from 11/09/2021 in [facility] Total Score 2 6 6 2  0  PHQ-9 Total Score 6 19 17 7  --      Flowsheet Row Office Visit from 04/23/2023 in [facility] at [place] ED from 10/27/2022 in [facility] Emergency Department at [facility] Counselor from 09/04/2022 in [facility] at [place]  C-SSRS RISK CATEGORY No Risk No Risk No Risk        Assessment and Plan: This patient is a 53 year old female with a history of PTSD, nightmares depression anxiety and ADHD.  She is doing better since we added Abilify  2 mg daily to her regimen.  She will continue this as well as Paxil 40 mg daily for depression, prazosin 2 mg at bedtime for nightmares and Adderall 20 mg twice daily for ADHD.  She will return to see me in 3 months  Collaboration of Care: Collaboration of Care: Referral or follow-up with counselor/therapist AEB patient will continue therapy with Suzan Garibaldi in our office  Patient/Guardian was advised Release of Information must be obtained prior to any record release in order to collaborate their care with an outside provider. Patient/Guardian was advised if they have not already done so to contact the registration department to sign all necessary forms in order for [place] to release information regarding their care.   Consent: Patient/Guardian gives verbal consent for treatment and assignment of benefits for services provided during this visit.  Patient/Guardian expressed understanding and agreed to proceed.    Diannia Ruder, MD 09/11/2023, 1:32 PM

## 2023-09-13 ENCOUNTER — Other Ambulatory Visit: Payer: Self-pay | Admitting: Internal Medicine

## 2023-09-13 DIAGNOSIS — M79641 Pain in right hand: Secondary | ICD-10-CM

## 2023-09-13 DIAGNOSIS — R768 Other specified abnormal immunological findings in serum: Secondary | ICD-10-CM

## 2023-09-24 ENCOUNTER — Ambulatory Visit (INDEPENDENT_AMBULATORY_CARE_PROVIDER_SITE_OTHER): Payer: 59 | Admitting: Clinical

## 2023-09-24 DIAGNOSIS — F4310 Post-traumatic stress disorder, unspecified: Secondary | ICD-10-CM | POA: Diagnosis not present

## 2023-09-24 DIAGNOSIS — F419 Anxiety disorder, unspecified: Secondary | ICD-10-CM | POA: Diagnosis not present

## 2023-09-24 DIAGNOSIS — F900 Attention-deficit hyperactivity disorder, predominantly inattentive type: Secondary | ICD-10-CM

## 2023-09-24 DIAGNOSIS — F331 Major depressive disorder, recurrent, moderate: Secondary | ICD-10-CM

## 2023-09-24 NOTE — Progress Notes (Signed)
 Virtual Visit via Video Note  I connected with Lindsay Walsh on 09/24/23 at  3:00 PM EDT by a video enabled telemedicine application and verified that I am speaking with the correct person using two identifiers.  Location: Patient: home Provider: office   I discussed the limitations of evaluation and management by telemedicine and the availability of in person appointments. The patient expressed understanding and agreed to proceed.   THERAPIST PROGRESS NOTE   Session Time: 3:00 PM-3:25 PM   Participation Level: Active   Behavioral Response: CasualAlertDepressed   Type of Therapy: Individual Therapy   Treatment Goals addressed: Coping for MH diagnoses   Interventions: CBT   Summary: Lindsay Walsh is a 53 y.o. female who presents with Depression, ADHD, PTSD  . The OPT therapist worked with the patient for her ongoing OPT treatment session. The OPT therapist utilized Motivational Interviewing to assist in creating therapeutic repore. The patient in the session was engaged and work in collaboration giving feedback about her triggers and symptoms over the past few weeks. The patient spoke about adjustment with her med therapy and feeling the recent change has been helpful in management of her MH.The patient spoke about her experiences and interactions over the  with family these were reported to go well. The patient spoke the impact of getting out of the home when she can making her feel better. The patient spoke about her larger goal of getting involved with the local [employer] in April post a upcoming procedure and recovery for liver biopsy later this week. The OPT therapist utilized Cognitive Behavioral Therapy through cognitive restructuring as well as worked with the patient on coping strategies to assist in management of mood. The patient spoke about the improvements with weight loss and taking Osempic to help her lose weight and decrease her Cardiovascular risks.   Suicidal/Homicidal:  Nowithout intent/plan   Therapist Response: The OPT therapist worked with the patient for the patients scheduled session. The patient was engaged in her session and gave feedback in relation to triggers, symptoms, and behavior responses over the past few weeks. The OPT therapist worked with the patient utilizing an in session Cognitive Behavioral Therapy exercise. The patient was responsive in the session and verbalized," My goal right now is to get myself to the local [employer] after I get through with the recovery from this upcoming biopsy for my liver and then I am also going to be doing PT". The OPT therapist worked with the patient on placing her energy/focus/and attention in areas of her life she has control over and doing her self check ins throughout the week. The OPT therapist promoted the patient being active within her physical health limitations and change environment when possible once we have better/ warmer outside weather. The patient spoke about starting Osempic for weight management along with a add on medication to help better manage her Depression.The OPT therapist will continue treatment work with the patient in her next scheduled session   Plan: Return again in 3 weeks.   Diagnosis       Axis I: Recurrent Moderate MDD with anxiety, ADHD inattentive type, PTSD.                             Axis II: No diagnosis     Collaboration of Care: Overview of patient involvement with Dr. Tenny Craw in the med therapy program .   Patient/Guardian was advised Release of Information must be obtained  prior to any record release in order to collaborate their care with an outside provider. Patient/Guardian was advised if they have not already done so to contact the registration department to sign all necessary forms in order for [place] to release information regarding their care.    Consent: Patient/Guardian gives verbal consent for treatment and assignment of benefits for services provided during this visit.  Patient/Guardian expressed understanding and agreed to proceed.    I discussed the assessment and treatment plan with the patient. The patient was provided an opportunity to ask questions and all were answered. The patient agreed with the plan and demonstrated an understanding of the instructions.   The patient was advised to call back or seek an in-person evaluation if the symptoms worsen or if the condition fails to improve as anticipated.   I provided 30 minutes of non-face-to-face time during this encounter.   Winfred Burn, LCSW   09/24/2023

## 2023-10-02 ENCOUNTER — Telehealth (HOSPITAL_COMMUNITY): Payer: Self-pay

## 2023-10-02 ENCOUNTER — Ambulatory Visit (HOSPITAL_COMMUNITY): Payer: 59

## 2023-10-02 NOTE — Telephone Encounter (Signed)
 Spoke with patient who states she just had a liver biopsy at [facility] and is still recovering.  Would like to reschedule her evaluation.  Front office notified.    1:23 PM, 10/02/23 Azael Ragain Small Jwan Hornbaker MPT [facility] [place] [phone]

## 2023-10-09 ENCOUNTER — Ambulatory Visit (INDEPENDENT_AMBULATORY_CARE_PROVIDER_SITE_OTHER): Payer: 59 | Admitting: Gastroenterology

## 2023-10-09 ENCOUNTER — Encounter (INDEPENDENT_AMBULATORY_CARE_PROVIDER_SITE_OTHER): Payer: Self-pay | Admitting: Gastroenterology

## 2023-10-09 VITALS — BP 134/85 | HR 99 | Temp 98.7°F | Ht 60.0 in | Wt 195.7 lb

## 2023-10-09 DIAGNOSIS — K754 Autoimmune hepatitis: Secondary | ICD-10-CM

## 2023-10-09 DIAGNOSIS — K580 Irritable bowel syndrome with diarrhea: Secondary | ICD-10-CM

## 2023-10-09 DIAGNOSIS — K3184 Gastroparesis: Secondary | ICD-10-CM

## 2023-10-09 DIAGNOSIS — K219 Gastro-esophageal reflux disease without esophagitis: Secondary | ICD-10-CM

## 2023-10-09 DIAGNOSIS — E1143 Type 2 diabetes mellitus with diabetic autonomic (poly)neuropathy: Secondary | ICD-10-CM

## 2023-10-09 DIAGNOSIS — R140 Abdominal distension (gaseous): Secondary | ICD-10-CM

## 2023-10-09 MED ORDER — METOCLOPRAMIDE HCL 5 MG PO TABS
5.0000 mg | ORAL_TABLET | Freq: Three times a day (TID) | ORAL | 3 refills | Status: AC
Start: 2023-10-09 — End: ?

## 2023-10-09 MED ORDER — FAMOTIDINE 20 MG PO TABS: 20.0000 mg | ORAL_TABLET | Freq: Every day | ORAL | 3 refills | Status: AC

## 2023-10-09 NOTE — Patient Instructions (Addendum)
-  we will refer [facility] to [facility] for breath testing in regards to your bloating -continue align probiotic for now  -continue to follow with [facility] -keep taking azathioprine for now, will await recommendations from [facility] on continuing this  -continue nexium 40mg  daily -start famotidine 20mg  at bedtime -be mindful of greasy, spicy, fried, citrus foods, caffeine, carbonated drinks, chocolate as these can increase reflux symptoms -Stay upright 2-3 hours after eating, prior to lying down and avoid eating late in the evenings. Elevate head of bed at night  -continue reglan 5mg  before meals, try to limit this to be taken before largest meals of the day if possible  - continue smaller meals throughout the day  Follow up 6 months  It was a pleasure to see you today. I want to create trusting relationships with patients and provide genuine, compassionate, and quality care. I truly value your feedback! please be on the lookout for a survey regarding your visit with me today. I appreciate your input about our visit and your time in completing this!    Breaunna Gottlieb L. Jeanmarie Hubert, MSN, APRN, AGNP-C Adult-Gerontology Nurse Practitioner [facility] at [facility]

## 2023-10-09 NOTE — Progress Notes (Unsigned)
 Referring Provider: Eliezer Lofts, MD Primary Care Physician:  Eliezer Lofts, MD Primary GI Physician: Dr. Levon Hedger   Chief Complaint  Patient presents with   Follow-up    Pt arrives for follow up. Pt had liver biopsy done at [facility] about 2 weeks ago(results in Care Everywhere).    HPI:   Lindsay Walsh is a 53 y.o. female with past medical history of lupus, autoimmune hepatitis with stage II -III fibrosis, diabetic gastroparesis, GERD, depression, fibromyalgia, chronic opiate use, diabetes, hyperlipidemia, IBS, anxiety and obesity  Patient presenting today for:  Follow up of AIH (recent biopsy with no fibrosis) Diabetic gastroparesis IBS-D GERD  Last seen August 2024, at that time, patient taking Reglan 3 times a day before each meal, belches were much better than domperidone.  Having worsening diarrhea 6 months.  Tried over-the-counter Imodium with their improvement.  Feeling bloated.  Taking hydrocodone once a day.  Taking over-the-counter Nexium which helps some with her reflux.  Patient currently following with [facility]ology as well.  Advised to continue his therapy 20 mg daily.  FibroScan at [facility] on 04/08/2022 with median KPA 7.4, S3 steatosis and F2 fibrosis.  Patient recommended to have CBC, CMP, celiac disease panel and IgG, continue severe cream 50 mg daily, continue Reglan 5 mg 3 times daily, eat smaller meals, follow with [facility], restart esomeprazole 40 mg daily, repeat FibroScan in 1 year  Labs in August with IgG 982, celiac panel negative  Most recent labs done on 09/26/2023 with 0.9, CBC unremarkable, CMP done  2/27 with albumin 4, ALT 27, AST 30, t bili 0.6, alk phos 77  Patient underwent liver biopsy on 09/26/23 Liver parenchyma with ~20% steatosis. Patchy and mild portal inflammation. Trichrome stain shows no pathologic fibrosis.  Present:  She states that she has not heard from [facility] yet regarding liver biopsy results. She has appt with Dr. Adela Lank  in June to discuss results and if she is going to stay on AZA.  She is still taking reglan before meals, feels that episodes of nausea/vomiting have lessened quite a bit. She notes a lot of gas/bloating. She notes bloating occurs a lot after eating and when she gets up in the morning. She is passing some flatus. Has some gas pains at times. Has taken gas x which helps some. She does not feel that passing gas or taking gas x helps with her bloating though. She feels that she sometimes looks pregnant due to bloating. Has not noticed if certain foods tend to bring on bloating or not other than if she drinks a V8. She is taking align, feels it may help some though not as much as it was previously.   She is notably on ozempic, has been on this for about 1 month but denies worsening of GI symptoms since initiation.   She is having some GERD symptoms on occasion, had a bad episode a few weeks ago where she felt like her throat was burning during the night, she eventually vomited which helped some. Symptoms are typically late at night though she reports symptoms only occurring about twice per week. She has tried to elevate HOB some. She tries to avoid eating late and laying down shortly after. No dysphagia or odynophagia.   She denies constipation. Has diarrhea off and on, this has been chronic for many years due to history of IBS. No rectal bleeding or melena.   Pertinent history: -biopsy proven autoimmune hepatitis diagnosed 2004 following acute  onset of jaundice with originally negative serologic markers for AIH. She underwent liver biopsy which was consistent with autoimmune hepatitis with grade 3 inflammation and stage II-III fibrosis.  -subsequent liver biopsy in 2007 following several years of Imuran therapy and this revealed mild portal and periportal fibrosis with no inflammation.  -follow-up biopsy again done in 2010 showed similar resolution of inflammation.   Last EGD: 04/25/2018 - normal Last  Colonoscopy: 04/25/2018 - had one tubular adenomas   Repeat colonoscopy in 04/2025   Filed Weights   10/09/23 1425  Weight: 195 lb 11.2 oz (88.8 kg)     Past Medical History:  Diagnosis Date   Acid reflux    Allergic rhinitis due to pollen    Anxiety    Arthritis    back   Autoimmune hepatitis (HCC)    Back pain    Connective tissue disease (HCC)    Depression    Dizziness and giddiness    Dyspareunia 05/06/2014   Dysrhythmia    palpatations; on propafenone   Essential (primary) hypertension    Fibromyalgia    Gastroesophageal reflux disease    Chronic abdominal pain; gastroparesis; globus hystericus; irritable bowel syndrome   Glaucoma    Hereditary and idiopathic neuropathy, unspecified    Hyperlipidemia    Irritable bowel syndrome    Major depressive disorder, single episode, unspecified    Morbid (severe) obesity due to excess calories (HCC)    Narcotic dependence (HCC)    Nonspecific mesenteric lymphadenitis    Overweight(278.02)    Pain in limb    Pain in right foot    PTSD (post-traumatic stress disorder)    Pulmonary nodule    Restless legs syndrome    Sleep apnea    on CPAP machine   Stroke (HCC) 2016   Systemic lupus erythematosus (SLE) in adult [facility])    Tobacco abuse    one pack per day; 35 pack years; quit in February 2023   Type 2 diabetes mellitus with hyperglycemia (HCC)     Past Surgical History:  Procedure Laterality Date   ABDOMINAL HYSTERECTOMY     TAH&BSO   BACK SURGERY     BLADDER SUSPENSION  09/12/2011   Procedure: TRANSVAGINAL TAPE (TVT) PROCEDURE;  Surgeon: Ky Barban, MD;  Location: [facility];  Service: Urology;  Laterality: N/A;   CESAREAN SECTION     X3   CHOLECYSTECTOMY     CORONARY PRESSURE/FFR STUDY N/A 04/21/2022   Procedure: INTRAVASCULAR PRESSURE WIRE/FFR STUDY;  Surgeon: Marykay Lex, MD;  Location: [facility] INVASIVE CV LAB;  Service: Cardiovascular;  Laterality: N/A;   ESOPHAGOGASTRODUODENOSCOPY ENDOSCOPY     "throat  stretched" per pt.   LEFT HEART CATH AND CORONARY ANGIOGRAPHY N/A 04/21/2022   Procedure: LEFT HEART CATH AND CORONARY ANGIOGRAPHY;  Surgeon: Marykay Lex, MD;  Location: [facility] INVASIVE CV LAB;  Service: Cardiovascular;  Laterality: N/A;   LIVER BIOPSY     x4   LUMBAR EPIDURAL INJECTION  01/2012   TOTAL ABDOMINAL HYSTERECTOMY W/ BILATERAL SALPINGOOPHORECTOMY     TUBAL LIGATION     Bilateral   UPPER GASTROINTESTINAL ENDOSCOPY  09/13/2010    Current Outpatient Medications  Medication Sig Dispense Refill   ACCU-CHEK GUIDE test strip USE TO CHECK BLOOD SUGAR FOUR TIMES DAILY     Accu-Chek Softclix Lancets lancets CHECK SUGAR ONCE DAILY     amphetamine-dextroamphetamine (ADDERALL) 20 MG tablet Take 1 tablet (20 mg total) by mouth 2 (two) times daily. 60 tablet 0  amphetamine-dextroamphetamine (ADDERALL) 20 MG tablet Take 1 tablet (20 mg total) by mouth 2 (two) times daily. 60 tablet 0   amphetamine-dextroamphetamine (ADDERALL) 20 MG tablet Take 1 tablet (20 mg total) by mouth 2 (two) times daily. 60 tablet 0   ARIPiprazole (ABILIFY) 2 MG tablet Take 1 tablet (2 mg total) by mouth at bedtime. 30 tablet 2   azaTHIOprine (IMURAN) 50 MG tablet Take 1 tablet (50 mg total) by mouth daily. 90 tablet 1   bimatoprost (LUMIGAN) 0.01 % SOLN Place 1 drop into both eyes at bedtime.     Calcium Citrate-Vitamin D3 (CALCIUM CITRATE +D) 315-6.25 MG-MCG TABS Take 1 tablet by mouth 2 (two) times daily.     cholecalciferol (VITAMIN D3) 25 MCG (1000 UNIT) tablet Take 2,000 Units by mouth daily.     cycloSPORINE (RESTASIS) 0.05 % ophthalmic emulsion Place 1 drop into both eyes 2 (two) times daily.     dicyclomine (BENTYL) 10 MG capsule Take 1 capsule (10 mg total) by mouth 2 (two) times daily as needed. 60 capsule 2   esomeprazole (NEXIUM) 40 MG capsule Take 1 capsule (40 mg total) by mouth daily at 12 noon. 90 capsule 3   FARXIGA 10 MG TABS tablet Take 10 mg by mouth daily.     fexofenadine (ALLEGRA) 180 MG  tablet Take 180 mg by mouth daily.     gabapentin (NEURONTIN) 100 MG capsule Take 100 mg by mouth 3 (three) times daily.     HYDROcodone-acetaminophen (NORCO/VICODIN) 5-325 MG tablet Take 1 tablet by mouth 2 (two) times daily as needed for moderate pain. 0.5 -1 po bid prn     hydroxychloroquine (PLAQUENIL) 200 MG tablet Take 1 tablet (200 mg total) by mouth 2 (two) times daily. TAKE 1 TABLET(200 MG) BY MOUTH TWICE DAILY 180 tablet 0   isosorbide mononitrate (IMDUR) 30 MG 24 hr tablet Take 30 mg by mouth every morning.     lisinopril (ZESTRIL) 40 MG tablet Take 40 mg by mouth daily.     metFORMIN (GLUCOPHAGE) 1000 MG tablet Take 1 tablet (1,000 mg total) by mouth 2 (two) times daily with a meal. 60 tablet 3   methocarbamol (ROBAXIN) 500 MG tablet Take 500 mg by mouth 2 (two) times daily as needed for muscle spasms.     metoCLOPramide (REGLAN) 5 MG tablet Take 1 tablet (5 mg total) by mouth 3 (three) times daily before meals. 210 tablet 3   Multiple Vitamins-Minerals (CENTRUM) tablet Take 1 tablet by mouth daily.     nitroGLYCERIN (NITROLINGUAL) 0.4 MG/SPRAY spray Place 1 spray under the tongue every 5 (five) minutes x 3 doses as needed for chest pain.     NP THYROID 60 MG tablet TAKE 1 TABLET(60 MG) BY MOUTH DAILY BEFORE BREAKFAST (Patient taking differently: Take 60 mg by mouth daily before breakfast.) 30 tablet 3   OZEMPIC, 1 MG/DOSE, 4 MG/3ML SOPN Inject 1 mg into the skin once a week.     PARoxetine (PAXIL) 40 MG tablet Take 1 tablet (40 mg total) by mouth at bedtime. 90 tablet 2   Polyvinyl Alcohol-Povidone (REFRESH OP) Place 1 drop into both eyes daily as needed (dry eyes).     prazosin (MINIPRESS) 2 MG capsule TAKE 1 CAPSULE(2 MG) BY MOUTH AT BEDTIME 30 capsule 2   PRESCRIPTION MEDICATION Take 10 mg by mouth 3 (three) times daily before meals. This is MOTILLIUM ( DOMPERIDONE 10 mg) Tablet     PREVIDENT 5000 PLUS 1.1 % CREA dental cream Place  1 Application onto teeth every morning.      Probiotic Product (PROBIOTIC PO) Take 1 capsule by mouth daily.     propafenone (RYTHMOL SR) 225 MG 12 hr capsule Take 225 mg by mouth every 12 (twelve) hours.     REPATHA 140 MG/ML SOSY Inject 1 mL into the skin every 14 (fourteen) days.     rosuvastatin (CRESTOR) 40 MG tablet Take 40 mg by mouth daily.     spironolactone (ALDACTONE) 25 MG tablet Take 25 mg by mouth daily.     No current facility-administered medications for this visit.    Allergies as of 10/09/2023 - Review Complete 10/09/2023  Allergen Reaction Noted   Doxycycline Shortness Of Breath and Rash    Fentanyl Shortness Of Breath, Itching, and Other (See Comments) 02/16/2011   Cefoxitin  12/09/2019   Ceftin [cefuroxime axetil]  04/02/2014   Iodinated contrast media Other (See Comments) 02/16/2011   Iohexol Other (See Comments) 11/25/2004   Norvasc [amlodipine besylate]  04/02/2014   Trintellix [vortioxetine]  04/18/2022   Wellbutrin [bupropion hcl] Nausea And Vomiting 09/11/2011   Latex Rash 07/16/2013   Tape Rash 09/11/2011    Social History   Socioeconomic History   Marital status: Divorced    Spouse name: Not on file   Number of children: 3   Years of education: GED   Highest education level: Not on file  Occupational History    Employer: NOT EMPLOYED  Tobacco Use   Smoking status: Former    Current packs/day: 0.00    Average packs/day: 1.5 packs/day for 25.0 years (37.5 ttl pk-yrs)    Types: Cigarettes    Start date: 04/25/1988    Quit date: 04/25/2013    Years since quitting: 10.4    Passive exposure: Never   Smokeless tobacco: Never  Vaping Use   Vaping status: Every Day   Start date: 04/22/2013   Substances: Nicotine, Flavoring  Substance and Sexual Activity   Alcohol use: No   Drug use: No   Sexual activity: Yes    Birth control/protection: Surgical, Abstinence  Other Topics Concern   Not on file  Social History Narrative   Divorced since 1994.Lives with boyfriend of 11 years.On  disability.   Social Drivers of Corporate investment banker Strain: Not on file  Food Insecurity: Not on file  Transportation Needs: Not on file  Physical Activity: Not on file  Stress: Not on file  Social Connections: Not on file    Review of systems General: negative for malaise, night sweats, fever, chills, weight loss Neck: Negative for lumps, goiter, pain and significant neck swelling Resp: Negative for cough, wheezing, dyspnea at rest CV: Negative for chest pain, leg swelling, palpitations, orthopnea GI: denies melena, hematochezia, constipation, dysphagia, odyonophagia, early satiety or unintentional weight loss. +diarrhea +regurgitation +bloating +nausea/vomiting MSK: Negative for joint pain or swelling, back pain, and muscle pain. Derm: Negative for itching or rash Psych: Denies depression, anxiety, memory loss, confusion. No homicidal or suicidal ideation.  Heme: Negative for prolonged bleeding, bruising easily, and swollen nodes. Endocrine: Negative for cold or heat intolerance, polyuria, polydipsia and goiter. Neuro: negative for tremor, gait imbalance, syncope and seizures. The remainder of the review of systems is noncontributory.  Physical Exam: BP 134/85   Pulse 99   Temp 98.7 F (37.1 C)   Ht 5' (1.524 m)   Wt 195 lb 11.2 oz (88.8 kg)   BMI 38.22 kg/m  General:   Alert and oriented. No distress noted.  Pleasant and cooperative.  Head:  Normocephalic and atraumatic. Eyes:  Conjuctiva clear without scleral icterus. Mouth:  Oral mucosa pink and moist. Good dentition. No lesions. Heart: Normal rate and rhythm, s1 and s2 heart sounds present.  Lungs: Clear lung sounds in all lobes. Respirations equal and unlabored. Abdomen:  +BS, soft, non-tender and non-distended. No rebound or guarding. No HSM or masses noted. Derm: No palmar erythema or jaundice Msk:  Symmetrical without gross deformities. Normal posture. Extremities:  Without edema. Neurologic:  Alert and   oriented x4 Psych:  Alert and cooperative. Normal mood and affect.  Invalid input(s): "6 MONTHS"   ASSESSMENT: Lindsay Walsh is a 53 y.o. female presenting today for AIH, GERD, IBS-D, gastroparesis and bloating  IBS-D/Bloating: ongoing intermittent diarrhea, though now with more bloating over the past year. Endorses more flatulence as well. Taking gas x and a probiotic without much improvement though she does note she tried stopping align and felt even worse. Would recommend SIBO/SIMO testing, patient is amenable to referral to [facility] for this. Can continue with align probiotic for now if she feels it is providing some relief.   GERD: on nexium 40mg  daily, having some heartburn, regurgitation at night despite havign elevated HOB. Will continue with nexiumg 40mg  daily and add pepcid 20mg  at bedtime. She should continue with HOB elevation and good reflux precautions.   AIH: diagnosed via biopsy in 2004 which showed grade III inflammation and F2-F3 fibrosis. has been maintained on AZA 50mg  daily which she has tolerated well. Following with [facility]. Recent LFTs and IgG WNL. Recent liver biopsy in march without findings of fibrosis, though continued patchy/mild portal inflammation and 20% steatosis. She has upcoming follow up with [facility] in June to discuss liver biopsy findings and determine the need for continued AZA use. For now would continue on AZA, will defer to [facility] for further recommendations regarding stopping this in light of recent liver biopsy findings.    Gastroparesis:mostly well managed on reglan 5mg  TID before meals. Feels nausea/vomiting episodes have lessened since last visit. Recently started on ozempic which we discussed can worsen GI symptoms, especially gastric emptying though she notes no worsening of symptoms since starting this. Should continue with smaller meals throughout the day and trying to use reglan as sparingly as possible before her largest meals due to potential  side effects with ongoing use.    PLAN:  -referral to [facility] for SIBO testing -continue align -continue to follow with [facility], will continue AZA 50mg  daily for now -continue nexium 40mg  daily -start famotidine 20mg  at bedtime -continue reglan 5mg  TID, try to minimize use to before largest meals of the day - continue smaller meals throughout the day -good reflux precautions   All questions were answered, patient verbalized understanding and is in agreement with plan as outlined above.    Follow Up: 6 months   Momoko Slezak L. Jeanmarie Hubert, MSN, APRN, AGNP-C Adult-Gerontology Nurse Practitioner [facility]  I have reviewed the note and agree with the APP's assessment as described in this progress note  Patient has presented resolution of previously found fibrosis as there was no presence of fibrosis on samples taken from the liver.  She should continue with current medication regimen for now.  [name], MD Gastroenterology and Hepatology [facility] Gastroenterology

## 2023-10-10 DIAGNOSIS — R140 Abdominal distension (gaseous): Secondary | ICD-10-CM | POA: Insufficient documentation

## 2023-10-29 ENCOUNTER — Ambulatory Visit (INDEPENDENT_AMBULATORY_CARE_PROVIDER_SITE_OTHER): Admitting: Clinical

## 2023-10-29 DIAGNOSIS — F4310 Post-traumatic stress disorder, unspecified: Secondary | ICD-10-CM | POA: Diagnosis not present

## 2023-10-29 DIAGNOSIS — F331 Major depressive disorder, recurrent, moderate: Secondary | ICD-10-CM | POA: Diagnosis not present

## 2023-10-29 DIAGNOSIS — F419 Anxiety disorder, unspecified: Secondary | ICD-10-CM

## 2023-10-29 DIAGNOSIS — F900 Attention-deficit hyperactivity disorder, predominantly inattentive type: Secondary | ICD-10-CM

## 2023-10-29 NOTE — Progress Notes (Signed)
 Virtual Visit via Video Note   I connected with Lindsay Walsh on 10/29/23 at  3:00 PM EDT by a video enabled telemedicine application and verified that I am speaking with the correct person using two identifiers.   Location: Patient: home Provider: office   I discussed the limitations of evaluation and management by telemedicine and the availability of in person appointments. The patient expressed understanding and agreed to proceed.     THERAPIST PROGRESS NOTE   Session Time: 3:00 PM-3:25 PM   Participation Level: Active   Behavioral Response: CasualAlertDepressed   Type of Therapy: Individual Therapy   Treatment Goals addressed: Coping for MH diagnoses   Interventions: CBT   Summary: Lindsay Walsh is a 53 y.o. female who presents with Depression, ADHD, PTSD  . The OPT therapist worked with the patient for her ongoing OPT treatment session. The OPT therapist utilized Motivational Interviewing to assist in creating therapeutic repore. The patient in the session was engaged and work in collaboration giving feedback about her triggers and symptoms over the past few weeks. The patient spoke about enjoying spending time with family over the Easter weekend, although she noted she still had higher Anxiety being around other people. The patient spoke about adjustment with her med therapy and feeling the recent change has been helpful in management of her MH.The patient spoke about her experiences and interactions over the  with family these were reported to go well.  The patient spoke about going with a family member to the beach later this week. The patient spoke the ongoing impact of getting out of the home when she can making her feel better. The patient spoke about the results of her biopsy going. The OPT therapist utilized Cognitive Behavioral Therapy through cognitive restructuring as well as worked with the patient on coping strategies to assist in management of mood. The OPT  therapist worked with the patient on management of her Anxiety.   Suicidal/Homicidal: Nowithout intent/plan   Therapist Response: The OPT therapist worked with the patient for the patients scheduled session. The patient was engaged in her session and gave feedback in relation to triggers, symptoms, and behavior responses over the past few weeks. The OPT therapist worked with the patient utilizing an in session Cognitive Behavioral Therapy exercise. The patient was responsive in the session and verbalized," I am going to the beach with a family member and it aint gonna cost me anything".  The patient spoke about being excited to go to [place] , but being a little nervous about getting out and being around people. The OPT therapist worked with the patient on placing her energy/focus/and attention in areas of her life she has control over and doing her self check ins throughout the week. The OPT therapist promoted the patient being active within her physical health limitations and change environment when possible once we have better/ warmer outside weather. The patient will be at [facility] over the next 3 days and worked with the patient on strategy to manage her upcoming vacation. The patient working with the OPT therapist overviewing updating her CCA for 2025 at her follow up on May 19th. The OPT therapist will continue treatment work with the patient in her next scheduled session   Plan: Return again in 3 weeks.   Diagnosis       Axis I: Recurrent Moderate MDD with anxiety, ADHD inattentive type, PTSD.  Axis II: No diagnosis     Collaboration of Care: Overview of patient involvement with Dr. Avanell Bob in the med therapy program .   Patient/Guardian was advised Release of Information must be obtained prior to any record release in order to collaborate their care with an outside provider. Patient/Guardian was advised if they have not already done so to contact the  registration department to sign all necessary forms in order for us  to release information regarding their care.    Consent: Patient/Guardian gives verbal consent for treatment and assignment of benefits for services provided during this visit. Patient/Guardian expressed understanding and agreed to proceed.    I discussed the assessment and treatment plan with the patient. The patient was provided an opportunity to ask questions and all were answered. The patient agreed with the plan and demonstrated an understanding of the instructions.   The patient was advised to call back or seek an in-person evaluation if the symptoms worsen or if the condition fails to improve as anticipated.   I provided 25 minutes of non-face-to-face time during this encounter.   Lea Primmer, LCSW   10/29/2023

## 2023-10-31 ENCOUNTER — Ambulatory Visit (HOSPITAL_COMMUNITY)

## 2023-11-21 NOTE — Progress Notes (Deleted)
 Office Visit Note  Patient: Lindsay Walsh             Date of Birth: [birth year]           MRN: [id number]             PCP: Kahoano, Haku K, MD Referring: Kahoano, Haku K, MD Visit Date: 12/05/2023   Subjective:  No chief complaint on file.   History of Present Illness: Lindsay Walsh is a 53 y.o. female here for follow up for seronegative inflammatory arthritis on hydroxychloroquine  400 mg daily also on azathioprine  50 mg daily for autoimmune hepatitis.    Previous HPI 08/31/2023 Lindsay Walsh is a 53 y.o. female here for follow up for seronegative inflammatory arthritis on hydroxychloroquine  400 mg daily also on azathioprine  50 mg daily for autoimmune hepatitis.     She has ongoing issues with her knees, worse in the right knee as before but now also involving the left knee, which is tender and painful. The pain is located around the patella and is associated with crepitus. She has a history of bone spurs at the superior aspect of the patella. The pain sometimes causes her knee to give out, especially when descending stairs, necessitating support. No swelling is noted, but the area is tender. She is starting to walk on her treadmill as part of her physical activity.   She discusses her back issues, mentioning a previous appointment with Dr. Michale Age who dismissed her concerns due to mild findings on lumbar MRI. She experiences numbness in her left arm and hand, which her cardiologist suggested might be related to her back and neck rather than her heart. She is in the process of seeking a second opinion regarding her back issues.   She is currently taking Plaquenil  and Imuran . She is on a low dose of Imuran , taking 50 mg daily, and is concerned about stopping it due to her history of liver failure. Her liver has been stable for years on this medication.       Previous HPI 03/28/2023 Lindsay Walsh is a 53 y.o. female here for follow up for seronegative inflammatory arthritis  on hydroxychloroquine  400 mg daily also on azathioprine  50 mg daily for autoimmune hepatitis.  Overall feels like her arthritis problems are doing pretty well except with right knee pain.  This is aggravating more on some days especially like with weather change and is worse during movements requiring deep bending and getting up from low positions or climbing stairs.  Carpal tunnel symptoms are still doing better she is noticing some partial increase coming back recently.  She saw Dr. Michale Age had MRI of the lumbar spine there was facet arthropathy noted and has follow-up scheduled may be pursuing local injections for this soon.     Previous HPI 09/25/2022 Lindsay Walsh is a 53 y.o. female here for follow up for seronegative inflammatory arthritis on hydroxychloroquine  400 mg daily.  Her hand and wrist symptoms have remained well improved after steroid injection in September.  However she is having more trouble with some joint pains predominantly on the left side.  Left shoulder is causing pain for months that is persistent she cannot raise it above horizontal without significant pain and frequently gets abrupt pain and awakens her from sleep if she rolls onto the left side at night. She had this evaluated including xray last month and was told it showed calcification. Referred to PT but has not been able to get any scheduled  with her insurance so far. In the past 1 month is having more left hip pain on the side of the hip also gets worse with pressure on that area but also notices it just getting up and down from seated positions not as often while walking.  She has had some persistent bruises on the right arm present for several months neither resolving nor expanding.  She also had heart catheterization with diagnoses of coronary artery disease started on Repatha.  She is scheduled for ophthalmology follow-up next month with [facility].   Previous HPI 03/20/22 Lindsay Walsh is a 53 y.o. female here for  follow up for suspected seronegative arthritis with positive RNP Abs and autoimmune hepatitis. She started taking hydroxychloroquine  200 mg daily for this and feels her symptoms are overall somewhat improved. She still has numbness and joint pains in bilateral hands. Not seeing significant swelling and no rashes. Labs reviewed from 8/21 at [facility] including CBC and LFTs were normal.   Previous HPI 11/16/2021 Lindsay Walsh is a 53 y.o. female here for follow up for abnormal lab findings and continued ongoing joint pain in multiple areas.  After our last visit did not recommend any specific new anti-inflammatory medication.  She feels overall symptoms are doing worse compared to a year ago.  She has pain pretty much all over.  Some days she feels a sensitivity and pain other times feels like she has more numbness or diminished sensation throughout.  She is getting swelling in her hands and feet that is worse by the end of the day.  She keeps persistent fatigue usually regardless of her sleep quality.  Mixed constipation and diarrhea symptoms.  Repeat laboratory testing by her pain management provider concerning for worsening lab markers for connective tissue disease or inflammation.  Liver function test been remaining stable on Imuran .   Previous HPI 06/16/2020 Lindsay Walsh is a 53 y.o. female here for follow up with hand pain and sometimes all over pain, positive RNP antibodies here for repeat assessment for inflammatory joint pain, nailfold capillaroscopy, and positive RNP test. She continues to have pain in multiple places including her hands, knees, also neck and upper back. No signficant changes since her initial visit.   No Rheumatology ROS completed.   PMFS History:  Patient Active Problem List   Diagnosis Date Noted   Abdominal bloating 10/10/2023   Pain in right knee 03/28/2023   Diabetes mellitus without complication (HCC) 03/27/2023   Liver fibrosis 03/02/2023   Pain in left shoulder  09/25/2022   Progressive angina (HCC)    Coronary artery calcification seen on CT scan    History of nuclear stress test    Abnormal nuclear stress test 04/11/2022   Atypical angina (HCC) 04/11/2022   Bilateral carpal tunnel syndrome 04/05/2022   Positive ANA (antinuclear antibody) 11/16/2021   History of colonic polyps 11/15/2021   Bilateral hand pain 06/02/2020   OAB (overactive bladder) 11/05/2019   Recurrent UTI 11/05/2019   Diabetic gastroparesis (HCC) 08/21/2019   Diarrhea 07/15/2019   Abnormal finding on urinalysis 07/15/2019   Morbid (severe) obesity due to excess calories (HCC)    Major depressive disorder, single episode, unspecified    Restless legs syndrome    Sleep apnea    Fibromyalgia    Hereditary and idiopathic neuropathy, unspecified    Nonspecific mesenteric lymphadenitis    Pain in right foot    Type 2 diabetes mellitus with hyperglycemia (HCC)    Hypothyroidism 08/22/2018   DM  type 2 causing vascular disease (HCC) 08/21/2018   Hyperlipidemia associated with type 2 diabetes mellitus (HCC) 08/21/2018   Essential hypertension, benign 08/21/2018   Influenza A 08/10/2018   SIRS (systemic inflammatory response syndrome) (HCC) 08/10/2018   Cerebral infarction (HCC) 03/05/2015   Dyspareunia 05/06/2014   Obesity 09/30/2013   Bilateral hip pain 07/16/2013   Chronic pain syndrome 07/16/2013   Degenerative disc disease, lumbar 07/16/2013   Greater trochanteric bursitis of both hips 07/16/2013   Nightmares associated with chronic post-traumatic stress disorder 10/18/2012   Sprain of foot 03/14/2012   Weakness 01/29/2012   Irritable bowel syndrome with diarrhea 08/01/2011   Gastroesophageal reflux disease    Depression    Chest pain    Fasting hyperglycemia    TOBACCO ABUSE 04/28/2010   NARCOTIC ABUSE 04/28/2010   PULMONARY NODULE 04/28/2010   Autoimmune hepatitis (HCC) 04/28/2010   Myalgia and myositis 01/06/2009    Past Medical History:  Diagnosis Date    Acid reflux    Allergic rhinitis due to pollen    Anxiety    Arthritis    back   Autoimmune hepatitis (HCC)    Back pain    Connective tissue disease (HCC)    Depression    Dizziness and giddiness    Dyspareunia 05/06/2014   Dysrhythmia    palpatations; on propafenone   Essential (primary) hypertension    Fibromyalgia    Gastroesophageal reflux disease    Chronic abdominal pain; gastroparesis; globus hystericus; irritable bowel syndrome   Glaucoma    Hereditary and idiopathic neuropathy, unspecified    Hyperlipidemia    Irritable bowel syndrome    Major depressive disorder, single episode, unspecified    Morbid (severe) obesity due to excess calories (HCC)    Narcotic dependence (HCC)    Nonspecific mesenteric lymphadenitis    Overweight(278.02)    Pain in limb    Pain in right foot    PTSD (post-traumatic stress disorder)    Pulmonary nodule    Restless legs syndrome    Sleep apnea    on CPAP machine   Stroke (HCC) 2016   Systemic lupus erythematosus (SLE) in adult [facility])    Tobacco abuse    one pack per day; 35 pack years; quit in February 2023   Type 2 diabetes mellitus with hyperglycemia (HCC)     Family History  Problem Relation Age of Onset   Diabetes Mother    Hypertension Mother    Anxiety disorder Mother    Depression Mother    Drug abuse Mother    Heart disease Father    Diabetes Father    Anxiety disorder Father    Alcohol abuse Father    Depression Father    OCD Father    Thyroid  disease Sister    Healthy Brother    Healthy Daughter    Healthy Son    ADD / ADHD Son    Alcohol abuse Paternal Grandfather    Depression Paternal Grandfather    Cancer Paternal Grandfather        lung,skin   Tuberculosis Paternal Grandfather    Seizures Paternal Grandmother    Lupus Paternal Grandmother    ADD / ADHD Son    Anesthesia problems Neg Hx    Malignant hyperthermia Neg Hx    Pseudochol deficiency Neg Hx    Hypotension Neg Hx    Bipolar disorder Neg  Hx    Dementia Neg Hx    Paranoid behavior Neg Hx    Schizophrenia  Neg Hx    Sexual abuse Neg Hx    Physical abuse Neg Hx    Past Surgical History:  Procedure Laterality Date   ABDOMINAL HYSTERECTOMY     TAH&BSO   BACK SURGERY     BLADDER SUSPENSION  09/12/2011   Procedure: TRANSVAGINAL TAPE (TVT) PROCEDURE;  Surgeon: Reggie Caper, MD;  Location: [facility];  Service: Urology;  Laterality: N/A;   CESAREAN SECTION     X3   CHOLECYSTECTOMY     CORONARY PRESSURE/FFR STUDY N/A 04/21/2022   Procedure: INTRAVASCULAR PRESSURE WIRE/FFR STUDY;  Surgeon: Arleen Lacer, MD;  Location: [facility] INVASIVE CV LAB;  Service: Cardiovascular;  Laterality: N/A;   ESOPHAGOGASTRODUODENOSCOPY ENDOSCOPY     "throat stretched" per pt.   LEFT HEART CATH AND CORONARY ANGIOGRAPHY N/A 04/21/2022   Procedure: LEFT HEART CATH AND CORONARY ANGIOGRAPHY;  Surgeon: Arleen Lacer, MD;  Location: [facility] INVASIVE CV LAB;  Service: Cardiovascular;  Laterality: N/A;   LIVER BIOPSY     x4   LUMBAR EPIDURAL INJECTION  01/2012   TOTAL ABDOMINAL HYSTERECTOMY W/ BILATERAL SALPINGOOPHORECTOMY     TUBAL LIGATION     Bilateral   UPPER GASTROINTESTINAL ENDOSCOPY  09/13/2010   Social History   Social History Narrative   Divorced since 1994.Lives with boyfriend of 11 years.On disability.   Immunization History  Administered Date(s) Administered   Hepatitis A, Adult 06/24/2020, 02/27/2022   Influenza Inj Mdck Quad Pf 04/20/2016   Influenza Split 04/07/2013, 05/17/2017   Influenza,inj,Quad PF,6+ Mos 05/17/2017, 04/04/2018, 03/31/2019, 04/28/2019   Influenza-Unspecified 04/17/2012, 03/20/2014, 08/09/2014, 05/06/2015, 06/24/2020   Moderna Sars-Covid-2 Vaccination 02/06/2020, 03/05/2020   Pneumococcal Conjugate-13 12/18/2019   Pneumococcal Polysaccharide-23 04/20/2016     Objective: Vital Signs: There were no vitals taken for this visit.   Physical Exam   Musculoskeletal Exam: ***  CDAI Exam: CDAI Score: -- Patient  Global: --; Provider Global: -- Swollen: --; Tender: -- Joint Exam 12/05/2023   No joint exam has been documented for this visit   There is currently no information documented on the homunculus. Go to the Rheumatology activity and complete the homunculus joint exam.  Investigation: No additional findings.  Imaging: No results found.  Recent Labs: Lab Results  Component Value Date   WBC 8.1 08/31/2023   HGB 13.4 08/31/2023   PLT 263 08/31/2023   NA 139 08/31/2023   K 4.9 08/31/2023   CL 102 08/31/2023   CO2 27 08/31/2023   GLUCOSE 125 (H) 08/31/2023   BUN 9 08/31/2023   CREATININE 0.88 08/31/2023   BILITOT 0.4 08/31/2023   ALKPHOS 105 03/06/2023   AST 22 08/31/2023   ALT 20 08/31/2023   PROT 6.9 08/31/2023   ALBUMIN 4.1 03/06/2023   CALCIUM  9.5 08/31/2023   GFRAA 101 08/03/2020    Speciality Comments: PLQ Eye Exam 11/14/2022 normal Dr. Candi Chafe f/u 6 months  Procedures:  No procedures performed Allergies: Doxycycline, Fentanyl, Cefoxitin, Ceftin [cefuroxime axetil], Iodinated contrast media, Iohexol , Norvasc [amlodipine besylate], Trintellix  [vortioxetine ], Wellbutrin [bupropion hcl], Latex, and Tape   Assessment / Plan:     Visit Diagnoses: No diagnosis found.  ***  Orders: No orders of the defined types were placed in this encounter.  No orders of the defined types were placed in this encounter.    Follow-Up Instructions: No follow-ups on file.   Glena Landau, RT  Note - This record has been created using [employer].  Chart creation errors have been sought, but may not always  have  been located. Such creation errors do not reflect on  the standard of medical care.

## 2023-11-26 ENCOUNTER — Ambulatory Visit (HOSPITAL_COMMUNITY): Admitting: Clinical

## 2023-11-26 DIAGNOSIS — F331 Major depressive disorder, recurrent, moderate: Secondary | ICD-10-CM | POA: Diagnosis not present

## 2023-11-26 DIAGNOSIS — F419 Anxiety disorder, unspecified: Secondary | ICD-10-CM | POA: Diagnosis not present

## 2023-11-26 DIAGNOSIS — F4310 Post-traumatic stress disorder, unspecified: Secondary | ICD-10-CM

## 2023-11-26 DIAGNOSIS — F900 Attention-deficit hyperactivity disorder, predominantly inattentive type: Secondary | ICD-10-CM | POA: Diagnosis not present
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
## 2023-12-05 ENCOUNTER — Ambulatory Visit: Payer: 59 | Admitting: Internal Medicine

## 2023-12-05 DIAGNOSIS — Z79899 Other long term (current) drug therapy: Secondary | ICD-10-CM

## 2023-12-05 DIAGNOSIS — G8929 Other chronic pain: Secondary | ICD-10-CM

## 2023-12-05 DIAGNOSIS — R768 Other specified abnormal immunological findings in serum: Secondary | ICD-10-CM

## 2023-12-05 DIAGNOSIS — K754 Autoimmune hepatitis: Secondary | ICD-10-CM

## 2023-12-06 ENCOUNTER — Ambulatory Visit (HOSPITAL_COMMUNITY)

## 2023-12-07 ENCOUNTER — Encounter (HOSPITAL_COMMUNITY): Payer: Self-pay

## 2023-12-07 ENCOUNTER — Ambulatory Visit (HOSPITAL_COMMUNITY)

## 2023-12-07 ENCOUNTER — Other Ambulatory Visit: Payer: Self-pay

## 2023-12-07 DIAGNOSIS — M25562 Pain in left knee: Secondary | ICD-10-CM | POA: Insufficient documentation

## 2023-12-07 DIAGNOSIS — G8929 Other chronic pain: Secondary | ICD-10-CM | POA: Diagnosis present

## 2023-12-07 DIAGNOSIS — M25561 Pain in right knee: Secondary | ICD-10-CM | POA: Diagnosis present

## 2023-12-07 NOTE — Therapy (Signed)
 OUTPATIENT PHYSICAL THERAPY LOWER EXTREMITY EVALUATION   Patient Name: Lindsay Walsh MRN: [id number] DOB:04-28-1971, 53 y.o., female Today's Date: 12/07/2023  END OF SESSION:  PT End of Session - 12/07/23 1229     Visit Number 1    Number of Visits 12    Date for PT Re-Evaluation 01/18/24    Authorization Type [employer]    Authorization Time Period seeking new auth    PT Start Time 1020    PT Stop Time 1100    PT Time Calculation (min) 40 min    Activity Tolerance Patient tolerated treatment well    Behavior During Therapy WFL for tasks assessed/performed             Past Medical History:  Diagnosis Date   Acid reflux    Allergic rhinitis due to pollen    Anxiety    Arthritis    back   Autoimmune hepatitis (HCC)    Back pain    Connective tissue disease (HCC)    Depression    Dizziness and giddiness    Dyspareunia 05/06/2014   Dysrhythmia    palpatations; on propafenone   Essential (primary) hypertension    Fibromyalgia    Gastroesophageal reflux disease    Chronic abdominal pain; gastroparesis; globus hystericus; irritable bowel syndrome   Glaucoma    Hereditary and idiopathic neuropathy, unspecified    Hyperlipidemia    Irritable bowel syndrome    Major depressive disorder, single episode, unspecified    Morbid (severe) obesity due to excess calories (HCC)    Narcotic dependence (HCC)    Nonspecific mesenteric lymphadenitis    Overweight(278.02)    Pain in limb    Pain in right foot    PTSD (post-traumatic stress disorder)    Pulmonary nodule    Restless legs syndrome    Sleep apnea    on CPAP machine   Stroke (HCC) 2016   Systemic lupus erythematosus (SLE) in adult [facility])    Tobacco abuse    one pack per day; 35 pack years; quit in February 2023   Type 2 diabetes mellitus with hyperglycemia (HCC)    Past Surgical History:  Procedure Laterality Date   ABDOMINAL HYSTERECTOMY     TAH&BSO   BACK SURGERY     BLADDER SUSPENSION   09/12/2011   Procedure: TRANSVAGINAL TAPE (TVT) PROCEDURE;  Surgeon: Reggie Caper, MD;  Location: [facility];  Service: Urology;  Laterality: N/A;   CESAREAN SECTION     X3   CHOLECYSTECTOMY     CORONARY PRESSURE/FFR STUDY N/A 04/21/2022   Procedure: INTRAVASCULAR PRESSURE WIRE/FFR STUDY;  Surgeon: Arleen Lacer, MD;  Location: [facility] INVASIVE CV LAB;  Service: Cardiovascular;  Laterality: N/A;   ESOPHAGOGASTRODUODENOSCOPY ENDOSCOPY     "throat stretched" per pt.   LEFT HEART CATH AND CORONARY ANGIOGRAPHY N/A 04/21/2022   Procedure: LEFT HEART CATH AND CORONARY ANGIOGRAPHY;  Surgeon: Arleen Lacer, MD;  Location: [facility] INVASIVE CV LAB;  Service: Cardiovascular;  Laterality: N/A;   LIVER BIOPSY     x4   LUMBAR EPIDURAL INJECTION  01/2012   TOTAL ABDOMINAL HYSTERECTOMY W/ BILATERAL SALPINGOOPHORECTOMY     TUBAL LIGATION     Bilateral   UPPER GASTROINTESTINAL ENDOSCOPY  09/13/2010   Patient Active Problem List   Diagnosis Date Noted   Abdominal bloating 10/10/2023   Pain in right knee 03/28/2023   Diabetes mellitus without complication (HCC) 03/27/2023   Liver fibrosis 03/02/2023   Pain in left shoulder  09/25/2022   Progressive angina (HCC)    Coronary artery calcification seen on CT scan    History of nuclear stress test    Abnormal nuclear stress test 04/11/2022   Atypical angina (HCC) 04/11/2022   Bilateral carpal tunnel syndrome 04/05/2022   Positive ANA (antinuclear antibody) 11/16/2021   History of colonic polyps 11/15/2021   Bilateral hand pain 06/02/2020   OAB (overactive bladder) 11/05/2019   Recurrent UTI 11/05/2019   Diabetic gastroparesis (HCC) 08/21/2019   Diarrhea 07/15/2019   Abnormal finding on urinalysis 07/15/2019   Morbid (severe) obesity due to excess calories (HCC)    Major depressive disorder, single episode, unspecified    Restless legs syndrome    Sleep apnea    Fibromyalgia    Hereditary and idiopathic neuropathy, unspecified    Nonspecific mesenteric  lymphadenitis    Pain in right foot    Type 2 diabetes mellitus with hyperglycemia (HCC)    Hypothyroidism 08/22/2018   DM type 2 causing vascular disease (HCC) 08/21/2018   Hyperlipidemia associated with type 2 diabetes mellitus (HCC) 08/21/2018   Essential hypertension, benign 08/21/2018   Influenza A 08/10/2018   SIRS (systemic inflammatory response syndrome) (HCC) 08/10/2018   Cerebral infarction (HCC) 03/05/2015   Dyspareunia 05/06/2014   Obesity 09/30/2013   Bilateral hip pain 07/16/2013   Chronic pain syndrome 07/16/2013   Degenerative disc disease, lumbar 07/16/2013   Greater trochanteric bursitis of both hips 07/16/2013   Nightmares associated with chronic post-traumatic stress disorder 10/18/2012   Sprain of foot 03/14/2012   Weakness 01/29/2012   Irritable bowel syndrome with diarrhea 08/01/2011   Gastroesophageal reflux disease    Depression    Chest pain    Fasting hyperglycemia    TOBACCO ABUSE 04/28/2010   NARCOTIC ABUSE 04/28/2010   PULMONARY NODULE 04/28/2010   Autoimmune hepatitis (HCC) 04/28/2010   Myalgia and myositis 01/06/2009    PCP: Kahoano, Haku K MD  REFERRING PROVIDER: Matt Song, MD  REFERRING DIAG:  [phone] (ICD-10-CM) - Chronic pain of right knee  M25.562,G89.29 (ICD-10-CM) - Chronic pain of left knee    THERAPY DIAG:  Chronic pain of left knee  Chronic pain of right knee  Rationale for Evaluation and Treatment: Rehabilitation  ONSET DATE: many years ago with increased lately  SUBJECTIVE:   SUBJECTIVE STATEMENT: Both knees hurt about the same and it will just hit. The MD was also talking about her neck/back that is bothering her but she's here for referral for knees today. Has difficulty with walking, stairs and sometimes they give out on her. Would love to be able to get around and have less pain and manage her knee pain with exercises and strengthening them.   PERTINENT HISTORY: Heart; liver disease; PTSD/MDD;  diabetic gastroparesis; hyperlipidemia; hx of lupus PAIN:  Are you having pain? Yes: NPRS scale: 2 current - 9 when going up/down steps - 0 when sitting and depends on the weather Pain location: around the kneecaps Pain description: achy when sitting; going down stairs is sharp/stabbing Aggravating factors: walking, stairs Relieving factors: sitting sometimes, biofreeze, resting, knee brace  PRECAUTIONS: Fall  RED FLAGS: None   WEIGHT BEARING RESTRICTIONS: No  FALLS:  Has patient fallen in last 6 months? Yes. Number of falls 1 about 6 months ago when her knees just gave out on her with a pain and she fell down the steps  LIVING ENVIRONMENT: Lives with: lives with their partner Lives in: House/apartment Stairs: Yes: External: 6 steps; can reach both Has  following equipment at home: Single point cane and compression sleeve knee brace (both)  OCCUPATION: disability  PLOF: Independent with basic ADLs, Needs assistance with homemaking, and boyfriend helps with lifting/carrying  PATIENT GOALS: Learn how to cope with pain in the knees as well as get them stronger, especially when she's in pain.   NEXT MD VISIT: TBD  OBJECTIVE:  Note: Objective measures were completed at Evaluation unless otherwise noted.  DIAGNOSTIC FINDINGS: XR L knee -Mild medial and patellofemoral compartment osteoarthritis, enthesophyte may also indicate chronic or quadriceps tendonitis  XR R knee - Mild appearing osteoarthritis, large patellar enthesophyte suggestive for  chronic quadriceps tendinitis or enthesitis   PATIENT SURVEYS:  LEFS  Survey date:    Score total:  27/80 - 33.8%    COGNITION: Overall cognitive status: Within functional limits for tasks assessed     SENSATION: Tingling in the bottom of her feet mostly but going up to leg sometimes; also UE N/T - neuropathy   MUSCLE LENGTH: Hamstrings: bilateral tightness noted with increased tightness R > L  POSTURE: rounded shoulders, forward  head, and increased thoracic kyphosis  PALPATION:    LOWER EXTREMITY ROM:  Active ROM Right eval Left eval  Hip flexion    Hip extension    Hip abduction    Hip adduction    Hip internal rotation    Hip external rotation    Knee flexion    Knee extension    Ankle dorsiflexion    Ankle plantarflexion    Ankle inversion    Ankle eversion     (Blank rows = not tested)  LOWER EXTREMITY MMT:  MMT Right eval Left eval  Hip flexion 4- 4-  Hip extension    Hip abduction 4- 4-  Hip adduction 4- 4-  Hip internal rotation    Hip external rotation    Knee flexion 4- 4-  Knee extension 4 4  Ankle dorsiflexion 4 4-  Ankle plantarflexion    Ankle inversion    Ankle eversion     (Blank rows = not tested)  FUNCTIONAL TESTS:  5 times sit to stand: 17.3 s  30 seconds chair stand test 9 repetitions (pain went from 2/10 to a 4/10 mostly in the L knee)  Normative values for the 5TSTS according to research:  50-59 years: 7.7  2.6 seconds.   GAIT: Distance walked: 3' w/ no increased in pain (maintained 2/10 pain)  Comments: Increased hip adduction and excessive pronation bilaterally; genu valgum bilaterally; trendelenburg; decreased trunk rotation/arm swing                                                                                                                               TREATMENT DATE:  12/07/23 - Eval and prescription of HEP for initiation of strengthening/stretching with pain management    PATIENT EDUCATION:  Education details: HEP, plan of care Person educated: Patient Education method: Explanation, Demonstration, and Tactile cues Education comprehension:  verbalized understanding and returned demonstration  HOME EXERCISE PROGRAM: Access Code: [email or web address] URL: [email or web address] Date: 12/07/2023 Prepared by: Leonard Raker  Exercises - Beginner Bridge  - 1 x daily - 7 x weekly - 3 sets - 10 reps - 3s hold - Straight Leg Raise  - 1 x daily  - 7 x weekly - 2 sets - 10 reps - 5s hold - Seated Hamstring Stretch  - 1 x daily - 7 x weekly - 2 sets - 6 reps - 15s  hold  ASSESSMENT:  CLINICAL IMPRESSION: Patient is a 53 y.o. female who was seen today for physical therapy evaluation and treatment for bilateral chronic knee pain exacerbated with walking long periods, stair navigation, and standing prolonged periods. Presents with weakness in hip musculature, impaired joint integrity, decreased functional endurance, and poor biomechanics with squatting/sit to stand and with gait with good prognosis for improving pain tolerance to activity with routine skilled PT services and maintenance of compliance with HEP.   OBJECTIVE IMPAIRMENTS: Abnormal gait, decreased activity tolerance, decreased endurance, difficulty walking, decreased strength, impaired flexibility, and improper body mechanics.   ACTIVITY LIMITATIONS: carrying, standing, squatting, stairs, transfers, and locomotion level  PARTICIPATION LIMITATIONS: shopping and community activity  PERSONAL FACTORS: 3+ comorbidities: liver disease, cardiac disease, and hx of lupus are also affecting patient's functional outcome.   REHAB POTENTIAL: Good  CLINICAL DECISION MAKING: Evolving/moderate complexity  EVALUATION COMPLEXITY: Moderate   GOALS: Goals reviewed with patient? No  SHORT TERM GOALS: Target date: 12/20/23 Pt will be independent with compliance with HEP at least 5/7 days of the week.  Baseline: prescribed Goal status: INITIAL  2.  Pt will be able to demonstrate straight leg raise 30 repetitions through full height without increased pain in back/LE to indicate compliance with HEP and improved strengthening. Baseline: decreased ROM and increased posterior hip pain on L > R Goal status: INITIAL  LONG TERM GOALS: Target date: 01/18/24  Pt will be able to progress and be independent with HEP for B knee strengthening and stretching as appropriate.  Baseline: prescribed Goal  status: INITIAL  2.  Pt will report improved functional ADL tolerance as indicated by improved LEFS score by at least 15 points.  Baseline: 27/80 Goal status: INITIAL  3.  Pt will improve her gait distance during by at least 75' with no increased pain in order to indicate improved functional BLE endurance and appropriate gait mechanics needed for functional mobility.  Baseline:  Goal status: INITIAL  4.  Pt 30s sit to stand will be improved by 4 repetitions and no increase in pain in order to indicate improved functional BLE strength.  Baseline: 9 repetitions  Goal status: INITIAL  PLAN:  PT FREQUENCY: 2x/week  PT DURATION: 6 weeks  PLANNED INTERVENTIONS: 97110-Therapeutic exercises, 97530- Therapeutic activity, 97112- Neuromuscular re-education, [phone]- Self Care, 19147- Manual therapy, [phone]- Gait training, [phone]- Orthotic Initial, [phone]- Electrical stimulation (unattended), [phone]- Electrical stimulation (manual), Patient/Family education, Balance training, Stair training, Taping, Dry Needling, Joint mobilization, and Joint manipulation  PLAN FOR NEXT SESSION: review goals; hip/knee strengthening; core/proximal HEP engagement   Lavaun Porto, PT 12/07/2023, 12:32 PM  Lavaun Porto PT, DPT [facility] [place] [phone] office  [facility] Medicare Auth Request Information  Date of referral: 08/31/23 Referring provider: Milton Alpers MD Referring diagnosis (ICD 10)?  Diagnosis  M25.561,G89.29 (ICD-10-CM) - Chronic pain of right knee  M25.562,G89.29 (ICD-10-CM) - Chronic pain of left knee   Treatment diagnosis (ICD 10)? (if different than  referring diagnosis)  Diagnosis  M25.561,G89.29 (ICD-10-CM) - Chronic pain of right knee  M25.562,G89.29 (ICD-10-CM) - Chronic pain of left knee    Functional Tool Score: Five times sit to stand: 17.3s without UE  What was this (referring dx) caused by? Arthritis and Unspecified (quadriceps  tendonitis)  Nature of Condition: Chronic (continuous duration > 3 months)   Laterality: Both  Current Functional Measure Score: LEFS 27/80 - 33%  Objective measurements identify impairments when they are compared to normal values, the uninvolved extremity, and prior level of function.  [x]  Yes  []  No  Objective assessment of functional ability: Moderate functional limitations   Briefly describe symptoms: Bilateral knee pain affecting safety with gait, transfers and stair navigation for entering and exiting home with increased over the last couple months  How did symptoms start: chronic stress at knees / osteoarthritis  Average pain intensity:  Last 24 hours: 4/10 average with 9/10 worst with stairs and 2/10 current  Past week: 4/10 (same as above)  How often does the pt experience symptoms? Frequently  How much have the symptoms interfered with usual daily activities? Quite a bit  How has condition changed since care began at this facility? NA - initial visit  In general, how is the patients overall health? Fair

## 2023-12-11 ENCOUNTER — Other Ambulatory Visit (HOSPITAL_COMMUNITY): Payer: Self-pay | Admitting: Psychiatry

## 2023-12-11 DIAGNOSIS — F515 Nightmare disorder: Secondary | ICD-10-CM

## 2023-12-17 ENCOUNTER — Telehealth (HOSPITAL_COMMUNITY): Admitting: Psychiatry

## 2023-12-18 ENCOUNTER — Ambulatory Visit (HOSPITAL_COMMUNITY): Admitting: Clinical

## 2023-12-18 ENCOUNTER — Encounter (HOSPITAL_COMMUNITY): Payer: Self-pay

## 2023-12-18 ENCOUNTER — Ambulatory Visit (HOSPITAL_COMMUNITY)

## 2023-12-18 DIAGNOSIS — G8929 Other chronic pain: Secondary | ICD-10-CM | POA: Diagnosis present

## 2023-12-18 DIAGNOSIS — M25561 Pain in right knee: Secondary | ICD-10-CM | POA: Insufficient documentation

## 2023-12-18 DIAGNOSIS — M25562 Pain in left knee: Secondary | ICD-10-CM | POA: Diagnosis present

## 2023-12-18 NOTE — Therapy (Signed)
 OUTPATIENT PHYSICAL THERAPY LOWER EXTREMITY TREATMENT   Patient Name: Lindsay Walsh MRN: [id number] DOB:[birth year], 53 y.o., female Today's Date: 12/18/2023  END OF SESSION:  PT End of Session - 12/18/23 1058     Visit Number 2    Number of Visits 12    Date for PT Re-Evaluation 01/18/24    Authorization Type [employer]    PT Start Time 1101    PT Stop Time 1140    PT Time Calculation (min) 39 min    Behavior During Therapy WFL for tasks assessed/performed             Past Medical History:  Diagnosis Date   Acid reflux    Allergic rhinitis due to pollen    Anxiety    Arthritis    back   Autoimmune hepatitis (HCC)    Back pain    Connective tissue disease (HCC)    Depression    Dizziness and giddiness    Dyspareunia 05/06/2014   Dysrhythmia    palpatations; on propafenone   Essential (primary) hypertension    Fibromyalgia    Gastroesophageal reflux disease    Chronic abdominal pain; gastroparesis; globus hystericus; irritable bowel syndrome   Glaucoma    Hereditary and idiopathic neuropathy, unspecified    Hyperlipidemia    Irritable bowel syndrome    Major depressive disorder, single episode, unspecified    Morbid (severe) obesity due to excess calories (HCC)    Narcotic dependence (HCC)    Nonspecific mesenteric lymphadenitis    Overweight(278.02)    Pain in limb    Pain in right foot    PTSD (post-traumatic stress disorder)    Pulmonary nodule    Restless legs syndrome    Sleep apnea    on CPAP machine   Stroke (HCC) 2016   Systemic lupus erythematosus (SLE) in adult [facility])    Tobacco abuse    one pack per day; 35 pack years; quit in February 2023   Type 2 diabetes mellitus with hyperglycemia (HCC)    Past Surgical History:  Procedure Laterality Date   ABDOMINAL HYSTERECTOMY     TAH&BSO   BACK SURGERY     BLADDER SUSPENSION  09/12/2011   Procedure: TRANSVAGINAL TAPE (TVT) PROCEDURE;  Surgeon: Reggie Caper, MD;  Location:  [facility];  Service: Urology;  Laterality: N/A;   CESAREAN SECTION     X3   CHOLECYSTECTOMY     CORONARY PRESSURE/FFR STUDY N/A 04/21/2022   Procedure: INTRAVASCULAR PRESSURE WIRE/FFR STUDY;  Surgeon: Arleen Lacer, MD;  Location: [facility] INVASIVE CV LAB;  Service: Cardiovascular;  Laterality: N/A;   ESOPHAGOGASTRODUODENOSCOPY ENDOSCOPY     "throat stretched" per pt.   LEFT HEART CATH AND CORONARY ANGIOGRAPHY N/A 04/21/2022   Procedure: LEFT HEART CATH AND CORONARY ANGIOGRAPHY;  Surgeon: Arleen Lacer, MD;  Location: [facility] INVASIVE CV LAB;  Service: Cardiovascular;  Laterality: N/A;   LIVER BIOPSY     x4   LUMBAR EPIDURAL INJECTION  01/2012   TOTAL ABDOMINAL HYSTERECTOMY W/ BILATERAL SALPINGOOPHORECTOMY     TUBAL LIGATION     Bilateral   UPPER GASTROINTESTINAL ENDOSCOPY  09/13/2010   Patient Active Problem List   Diagnosis Date Noted   Abdominal bloating 10/10/2023   Pain in right knee 03/28/2023   Diabetes mellitus without complication (HCC) 03/27/2023   Liver fibrosis 03/02/2023   Pain in left shoulder 09/25/2022   Progressive angina (HCC)    Coronary artery calcification seen on CT scan  History of nuclear stress test    Abnormal nuclear stress test 04/11/2022   Atypical angina (HCC) 04/11/2022   Bilateral carpal tunnel syndrome 04/05/2022   Positive ANA (antinuclear antibody) 11/16/2021   History of colonic polyps 11/15/2021   Bilateral hand pain 06/02/2020   OAB (overactive bladder) 11/05/2019   Recurrent UTI 11/05/2019   Diabetic gastroparesis (HCC) 08/21/2019   Diarrhea 07/15/2019   Abnormal finding on urinalysis 07/15/2019   Morbid (severe) obesity due to excess calories (HCC)    Major depressive disorder, single episode, unspecified    Restless legs syndrome    Sleep apnea    Fibromyalgia    Hereditary and idiopathic neuropathy, unspecified    Nonspecific mesenteric lymphadenitis    Pain in right foot    Type 2 diabetes mellitus with hyperglycemia (HCC)     Hypothyroidism 08/22/2018   DM type 2 causing vascular disease (HCC) 08/21/2018   Hyperlipidemia associated with type 2 diabetes mellitus (HCC) 08/21/2018   Essential hypertension, benign 08/21/2018   Influenza A 08/10/2018   SIRS (systemic inflammatory response syndrome) (HCC) 08/10/2018   Cerebral infarction (HCC) 03/05/2015   Dyspareunia 05/06/2014   Obesity 09/30/2013   Bilateral hip pain 07/16/2013   Chronic pain syndrome 07/16/2013   Degenerative disc disease, lumbar 07/16/2013   Greater trochanteric bursitis of both hips 07/16/2013   Nightmares associated with chronic post-traumatic stress disorder 10/18/2012   Sprain of foot 03/14/2012   Weakness 01/29/2012   Irritable bowel syndrome with diarrhea 08/01/2011   Gastroesophageal reflux disease    Depression    Chest pain    Fasting hyperglycemia    TOBACCO ABUSE 04/28/2010   NARCOTIC ABUSE 04/28/2010   PULMONARY NODULE 04/28/2010   Autoimmune hepatitis (HCC) 04/28/2010   Myalgia and myositis 01/06/2009    PCP: Kahoano, Haku K MD  REFERRING PROVIDER: Matt Song, MD  REFERRING DIAG:  [phone] (ICD-10-CM) - Chronic pain of right knee  M25.562,G89.29 (ICD-10-CM) - Chronic pain of left knee    THERAPY DIAG:  Chronic pain of left knee  Chronic pain of right knee  Rationale for Evaluation and Treatment: Rehabilitation  ONSET DATE: many years ago with increased lately  SUBJECTIVE:   SUBJECTIVE STATEMENT: 12/18/23:  Reports she has been busy with the graduations.  Has began the HEP without questions.  Has some intermittent pain Lt knee achey pain scale 1/10.  Eval:  Both knees hurt about the same and it will just hit. The MD was also talking about her neck/back that is bothering her but she's here for referral for knees today. Has difficulty with walking, stairs and sometimes they give out on her. Would love to be able to get around and have less pain and manage her knee pain with exercises and  strengthening them.   PERTINENT HISTORY: Heart; liver disease; PTSD/MDD; diabetic gastroparesis; hyperlipidemia; hx of lupus PAIN:  Are you having pain? Yes: NPRS scale: 2 current - 9 when going up/down steps - 0 when sitting and depends on the weather Pain location: around the kneecaps Pain description: achy when sitting; going down stairs is sharp/stabbing Aggravating factors: walking, stairs Relieving factors: sitting sometimes, biofreeze, resting, knee brace  PRECAUTIONS: Fall  RED FLAGS: None   WEIGHT BEARING RESTRICTIONS: No  FALLS:  Has patient fallen in last 6 months? Yes. Number of falls 1 about 6 months ago when her knees just gave out on her with a pain and she fell down the steps  LIVING ENVIRONMENT: Lives with: lives with their partner  Lives in: House/apartment Stairs: Yes: External: 6 steps; can reach both Has following equipment at home: Single point cane and compression sleeve knee brace (both)  OCCUPATION: disability  PLOF: Independent with basic ADLs, Needs assistance with homemaking, and boyfriend helps with lifting/carrying  PATIENT GOALS: Learn how to cope with pain in the knees as well as get them stronger, especially when she's in pain.   NEXT MD VISIT: TBD  OBJECTIVE:  Note: Objective measures were completed at Evaluation unless otherwise noted.  DIAGNOSTIC FINDINGS: XR L knee -Mild medial and patellofemoral compartment osteoarthritis, enthesophyte may also indicate chronic or quadriceps tendonitis  XR R knee - Mild appearing osteoarthritis, large patellar enthesophyte suggestive for  chronic quadriceps tendinitis or enthesitis   PATIENT SURVEYS:  LEFS  Survey date:    Score total:  27/80 - 33.8%    COGNITION: Overall cognitive status: Within functional limits for tasks assessed     SENSATION: Tingling in the bottom of her feet mostly but going up to leg sometimes; also UE N/T - neuropathy   MUSCLE LENGTH: Hamstrings: bilateral tightness  noted with increased tightness R > L  POSTURE: rounded shoulders, forward head, and increased thoracic kyphosis  PALPATION:    LOWER EXTREMITY ROM:  Active ROM Right eval Left eval  Hip flexion    Hip extension    Hip abduction    Hip adduction    Hip internal rotation    Hip external rotation    Knee flexion    Knee extension    Ankle dorsiflexion    Ankle plantarflexion    Ankle inversion    Ankle eversion     (Blank rows = not tested)  LOWER EXTREMITY MMT:  MMT Right eval Left eval  Hip flexion 4- 4-  Hip extension    Hip abduction 4- 4-  Hip adduction 4- 4-  Hip internal rotation    Hip external rotation    Knee flexion 4- 4-  Knee extension 4 4  Ankle dorsiflexion 4 4-  Ankle plantarflexion    Ankle inversion    Ankle eversion     (Blank rows = not tested)  FUNCTIONAL TESTS:  5 times sit to stand: 17.3 s  30 seconds chair stand test 9 repetitions (pain went from 2/10 to a 4/10 mostly in the L knee)  Normative values for the 5TSTS according to research:  50-59 years: 7.7  2.6 seconds.   GAIT: Distance walked: 5' w/ no increased in pain (maintained 2/10 pain)  Comments: Increased hip adduction and excessive pronation bilaterally; genu valgum bilaterally; trendelenburg; decreased trunk rotation/arm swing                                                                                                                               TREATMENT DATE:  12/18/23: Reviewed goals Educated importance of HEP compliance for maximal benefits  Supine:  Bridge 10x  SLR 10x Sidelying:  Clam with RTB  Abduction  Seated:  STS no HHA eccentric control  Hamstring stretch 2x 30" reports some relief in her back following stretch Standing:   Abduction with HHA  Marching alternating with ab set with intermittent HHA 10x 3-5"  Sidestep RTB around thigh   12/07/23 - Eval and prescription of HEP for initiation of strengthening/stretching with pain management     PATIENT EDUCATION:  Education details: HEP, plan of care Person educated: Patient Education method: Explanation, Demonstration, and Tactile cues Education comprehension: verbalized understanding and returned demonstration  HOME EXERCISE PROGRAM: Access Code: [email or web address] URL: [email or web address] Date: 12/07/2023 Prepared by: Leonard Raker  Exercises - Beginner Bridge  - 1 x daily - 7 x weekly - 3 sets - 10 reps - 3s hold - Straight Leg Raise  - 1 x daily - 7 x weekly - 2 sets - 10 reps - 5s hold - Seated Hamstring Stretch  - 1 x daily - 7 x weekly - 2 sets - 6 reps - 15s  hold  - Clam with Resistance  - 2 x daily - 7 x weekly - 1 sets - 10 reps - 3-5" hold - Beginner Side Leg Lift  - 1 x daily - 7 x weekly - 3 sets - 10 reps - 3-5" hold - Sit to Stand Without Arm Support  - 2 x daily - 7 x weekly - 1 sets - 10 reps  ASSESSMENT:  CLINICAL IMPRESSION: 12/18/23:  Reviewed goals and educated importance of HEP compliance with for maximal benefits.  Pt reports compliance though needed some reminding for form and mechanics with current exercise program.  Session focus with proximal hip and knee strengthening.  Pt able to complete all exercises with good form following initial cueing and no reports of pain through session.  Added some gluteal strengthening exercises to HEP with printout given and verbalized understanding.  Eval:  Patient is a 53 y.o. female who was seen today for physical therapy evaluation and treatment for bilateral chronic knee pain exacerbated with walking long periods, stair navigation, and standing prolonged periods. Presents with weakness in hip musculature, impaired joint integrity, decreased functional endurance, and poor biomechanics with squatting/sit to stand and with gait with good prognosis for improving pain tolerance to activity with routine skilled PT services and maintenance of compliance with HEP.   OBJECTIVE IMPAIRMENTS: Abnormal gait,  decreased activity tolerance, decreased endurance, difficulty walking, decreased strength, impaired flexibility, and improper body mechanics.   ACTIVITY LIMITATIONS: carrying, standing, squatting, stairs, transfers, and locomotion level  PARTICIPATION LIMITATIONS: shopping and community activity  PERSONAL FACTORS: 3+ comorbidities: liver disease, cardiac disease, and hx of lupus are also affecting patient's functional outcome.   REHAB POTENTIAL: Good  CLINICAL DECISION MAKING: Evolving/moderate complexity  EVALUATION COMPLEXITY: Moderate   GOALS: Goals reviewed with patient? No  SHORT TERM GOALS: Target date: 12/20/23 Pt will be independent with compliance with HEP at least 5/7 days of the week.  Baseline: prescribed Goal status: INITIAL  2.  Pt will be able to demonstrate straight leg raise 30 repetitions through full height without increased pain in back/LE to indicate compliance with HEP and improved strengthening. Baseline: decreased ROM and increased posterior hip pain on L > R Goal status: INITIAL  LONG TERM GOALS: Target date: 01/18/24  Pt will be able to progress and be independent with HEP for B knee strengthening and stretching as appropriate.  Baseline: prescribed Goal status: INITIAL  2.  Pt will report improved functional ADL tolerance as indicated by improved LEFS score by  at least 15 points.  Baseline: 27/80 Goal status: INITIAL  3.  Pt will improve her gait distance during by at least 75' with no increased pain in order to indicate improved functional BLE endurance and appropriate gait mechanics needed for functional mobility.  Baseline:  Goal status: INITIAL  4.  Pt 30s sit to stand will be improved by 4 repetitions and no increase in pain in order to indicate improved functional BLE strength.  Baseline: 9 repetitions  Goal status: INITIAL  PLAN:  PT FREQUENCY: 2x/week  PT DURATION: 6 weeks  PLANNED INTERVENTIONS: 97110-Therapeutic exercises,  97530- Therapeutic activity, 97112- Neuromuscular re-education, [phone]- Self Care, 19147- Manual therapy, [phone]- Gait training, [phone]- Orthotic Initial, [phone]- Electrical stimulation (unattended), [phone]- Electrical stimulation (manual), Patient/Family education, Balance training, Stair training, Taping, Dry Needling, Joint mobilization, and Joint manipulation  PLAN FOR NEXT SESSION:  hip/knee strengthening; core/proximal HEP engagement  Minor Amble, LPTA/CLT; CBIS [phone]  Alesia Anchors, PTA 12/18/2023, 2:55 PM

## 2023-12-20 ENCOUNTER — Ambulatory Visit (HOSPITAL_COMMUNITY)

## 2023-12-20 ENCOUNTER — Encounter (HOSPITAL_COMMUNITY): Payer: Self-pay

## 2023-12-20 DIAGNOSIS — M25562 Pain in left knee: Secondary | ICD-10-CM | POA: Diagnosis not present

## 2023-12-20 DIAGNOSIS — G8929 Other chronic pain: Secondary | ICD-10-CM

## 2023-12-20 NOTE — Therapy (Signed)
 OUTPATIENT PHYSICAL THERAPY LOWER EXTREMITY TREATMENT   Patient Name: Lindsay Walsh MRN: [id number] DOB:[birth year], 53 y.o., female Today's Date: 12/20/2023  END OF SESSION:  PT End of Session - 12/20/23 1107     Visit Number 3    Number of Visits 12    Date for PT Re-Evaluation 01/18/24    Authorization Type [employer]    Authorization Time Period No auth required    Progress Note Due on Visit 10    PT Start Time 1103    PT Stop Time 1143    PT Time Calculation (min) 40 min    Activity Tolerance Patient tolerated treatment well    Behavior During Therapy WFL for tasks assessed/performed           Past Medical History:  Diagnosis Date   Acid reflux    Allergic rhinitis due to pollen    Anxiety    Arthritis    back   Autoimmune hepatitis (HCC)    Back pain    Connective tissue disease (HCC)    Depression    Dizziness and giddiness    Dyspareunia 05/06/2014   Dysrhythmia    palpatations; on propafenone   Essential (primary) hypertension    Fibromyalgia    Gastroesophageal reflux disease    Chronic abdominal pain; gastroparesis; globus hystericus; irritable bowel syndrome   Glaucoma    Hereditary and idiopathic neuropathy, unspecified    Hyperlipidemia    Irritable bowel syndrome    Major depressive disorder, single episode, unspecified    Morbid (severe) obesity due to excess calories (HCC)    Narcotic dependence (HCC)    Nonspecific mesenteric lymphadenitis    Overweight(278.02)    Pain in limb    Pain in right foot    PTSD (post-traumatic stress disorder)    Pulmonary nodule    Restless legs syndrome    Sleep apnea    on CPAP machine   Stroke (HCC) 2016   Systemic lupus erythematosus (SLE) in adult [facility])    Tobacco abuse    one pack per day; 35 pack years; quit in February 2023   Type 2 diabetes mellitus with hyperglycemia (HCC)    Past Surgical History:  Procedure Laterality Date   ABDOMINAL HYSTERECTOMY     TAH&BSO   BACK  SURGERY     BLADDER SUSPENSION  09/12/2011   Procedure: TRANSVAGINAL TAPE (TVT) PROCEDURE;  Surgeon: Reggie Caper, MD;  Location: [facility];  Service: Urology;  Laterality: N/A;   CESAREAN SECTION     X3   CHOLECYSTECTOMY     CORONARY PRESSURE/FFR STUDY N/A 04/21/2022   Procedure: INTRAVASCULAR PRESSURE WIRE/FFR STUDY;  Surgeon: Arleen Lacer, MD;  Location: [facility] INVASIVE CV LAB;  Service: Cardiovascular;  Laterality: N/A;   ESOPHAGOGASTRODUODENOSCOPY ENDOSCOPY     throat stretched per pt.   LEFT HEART CATH AND CORONARY ANGIOGRAPHY N/A 04/21/2022   Procedure: LEFT HEART CATH AND CORONARY ANGIOGRAPHY;  Surgeon: Arleen Lacer, MD;  Location: [facility] INVASIVE CV LAB;  Service: Cardiovascular;  Laterality: N/A;   LIVER BIOPSY     x4   LUMBAR EPIDURAL INJECTION  01/2012   TOTAL ABDOMINAL HYSTERECTOMY W/ BILATERAL SALPINGOOPHORECTOMY     TUBAL LIGATION     Bilateral   UPPER GASTROINTESTINAL ENDOSCOPY  09/13/2010   Patient Active Problem List   Diagnosis Date Noted   Abdominal bloating 10/10/2023   Pain in right knee 03/28/2023   Diabetes mellitus without complication (HCC) 03/27/2023   Liver fibrosis  03/02/2023   Pain in left shoulder 09/25/2022   Progressive angina (HCC)    Coronary artery calcification seen on CT scan    History of nuclear stress test    Abnormal nuclear stress test 04/11/2022   Atypical angina (HCC) 04/11/2022   Bilateral carpal tunnel syndrome 04/05/2022   Positive ANA (antinuclear antibody) 11/16/2021   History of colonic polyps 11/15/2021   Bilateral hand pain 06/02/2020   OAB (overactive bladder) 11/05/2019   Recurrent UTI 11/05/2019   Diabetic gastroparesis (HCC) 08/21/2019   Diarrhea 07/15/2019   Abnormal finding on urinalysis 07/15/2019   Morbid (severe) obesity due to excess calories (HCC)    Major depressive disorder, single episode, unspecified    Restless legs syndrome    Sleep apnea    Fibromyalgia    Hereditary and idiopathic neuropathy,  unspecified    Nonspecific mesenteric lymphadenitis    Pain in right foot    Type 2 diabetes mellitus with hyperglycemia (HCC)    Hypothyroidism 08/22/2018   DM type 2 causing vascular disease (HCC) 08/21/2018   Hyperlipidemia associated with type 2 diabetes mellitus (HCC) 08/21/2018   Essential hypertension, benign 08/21/2018   Influenza A 08/10/2018   SIRS (systemic inflammatory response syndrome) (HCC) 08/10/2018   Cerebral infarction (HCC) 03/05/2015   Dyspareunia 05/06/2014   Obesity 09/30/2013   Bilateral hip pain 07/16/2013   Chronic pain syndrome 07/16/2013   Degenerative disc disease, lumbar 07/16/2013   Greater trochanteric bursitis of both hips 07/16/2013   Nightmares associated with chronic post-traumatic stress disorder 10/18/2012   Sprain of foot 03/14/2012   Weakness 01/29/2012   Irritable bowel syndrome with diarrhea 08/01/2011   Gastroesophageal reflux disease    Depression    Chest pain    Fasting hyperglycemia    TOBACCO ABUSE 04/28/2010   NARCOTIC ABUSE 04/28/2010   PULMONARY NODULE 04/28/2010   Autoimmune hepatitis (HCC) 04/28/2010   Myalgia and myositis 01/06/2009    PCP: Kahoano, Haku K MD  REFERRING PROVIDER: Matt Song, MD  REFERRING DIAG:  [phone] (ICD-10-CM) - Chronic pain of right knee  M25.562,G89.29 (ICD-10-CM) - Chronic pain of left knee    THERAPY DIAG:  Chronic pain of left knee  Chronic pain of right knee  Rationale for Evaluation and Treatment: Rehabilitation  ONSET DATE: many years ago with increased lately  SUBJECTIVE:   SUBJECTIVE STATEMENT: 12/20/2023 Pt reporting 2/10 pain today. Reports some soreness from HEP.  Eval:  Both knees hurt about the same and it will just hit. The MD was also talking about her neck/back that is bothering her but she's here for referral for knees today. Has difficulty with walking, stairs and sometimes they give out on her. Would love to be able to get around and have less pain and  manage her knee pain with exercises and strengthening them.   PERTINENT HISTORY: Heart; liver disease; PTSD/MDD; diabetic gastroparesis; hyperlipidemia; hx of lupus PAIN:  Are you having pain? Yes: NPRS scale: 2 current - 9 when going up/down steps - 0 when sitting and depends on the weather Pain location: around the kneecaps Pain description: achy when sitting; going down stairs is sharp/stabbing Aggravating factors: walking, stairs Relieving factors: sitting sometimes, biofreeze, resting, knee brace  PRECAUTIONS: Fall  RED FLAGS: None   WEIGHT BEARING RESTRICTIONS: No  FALLS:  Has patient fallen in last 6 months? Yes. Number of falls 1 about 6 months ago when her knees just gave out on her with a pain and she fell down the steps  LIVING ENVIRONMENT: Lives with: lives with their partner Lives in: House/apartment Stairs: Yes: External: 6 steps; can reach both Has following equipment at home: Single point cane and compression sleeve knee brace (both)  OCCUPATION: disability  PLOF: Independent with basic ADLs, Needs assistance with homemaking, and boyfriend helps with lifting/carrying  PATIENT GOALS: Learn how to cope with pain in the knees as well as get them stronger, especially when she's in pain.   NEXT MD VISIT: TBD  OBJECTIVE:  Note: Objective measures were completed at Evaluation unless otherwise noted.  DIAGNOSTIC FINDINGS: XR L knee -Mild medial and patellofemoral compartment osteoarthritis, enthesophyte may also indicate chronic or quadriceps tendonitis  XR R knee - Mild appearing osteoarthritis, large patellar enthesophyte suggestive for  chronic quadriceps tendinitis or enthesitis   PATIENT SURVEYS:  LEFS  Survey date:    Score total:  27/80 - 33.8%    COGNITION: Overall cognitive status: Within functional limits for tasks assessed     SENSATION: Tingling in the bottom of her feet mostly but going up to leg sometimes; also UE N/T - neuropathy   MUSCLE  LENGTH: Hamstrings: bilateral tightness noted with increased tightness R > L  POSTURE: rounded shoulders, forward head, and increased thoracic kyphosis  PALPATION:    LOWER EXTREMITY ROM:  Active ROM Right eval Left eval  Hip flexion    Hip extension    Hip abduction    Hip adduction    Hip internal rotation    Hip external rotation    Knee flexion    Knee extension    Ankle dorsiflexion    Ankle plantarflexion    Ankle inversion    Ankle eversion     (Blank rows = not tested)  LOWER EXTREMITY MMT:  MMT Right eval Left eval  Hip flexion 4- 4-  Hip extension    Hip abduction 4- 4-  Hip adduction 4- 4-  Hip internal rotation    Hip external rotation    Knee flexion 4- 4-  Knee extension 4 4  Ankle dorsiflexion 4 4-  Ankle plantarflexion    Ankle inversion    Ankle eversion     (Blank rows = not tested)  FUNCTIONAL TESTS:  5 times sit to stand: 17.3 s  30 seconds chair stand test 9 repetitions (pain went from 2/10 to a 4/10 mostly in the L knee)  Normative values for the 5TSTS according to research:  50-59 years: 7.7  2.6 seconds.   GAIT: Distance walked: 69' w/ no increased in pain (maintained 2/10 pain)  Comments: Increased hip adduction and excessive pronation bilaterally; genu valgum bilaterally; trendelenburg; decreased trunk rotation/arm swing                                                                                                                               TREATMENT DATE:  12/20/2023  -recumbent bike x 4' (seat 3) -Standing hip abduction isometric with HHA x 12 with 3''  isometric -Standing eccentric step down with cues for hip hinge on 4in box BUE on // bars 2x15 -Standing TKE with RTB 2x15 with BUE support -2in eccentric stepdown x 10 with single UE assist - Standing ankle DF against wall x 25 -Bosu ball lunges with hovering hands x 10 bilaterally 3-5'' hold  12/18/23: Reviewed goals Educated importance of HEP compliance for maximal  benefits  Supine:  Bridge 10x  SLR 10x Sidelying:  Clam with RTB  Abduction Seated:  STS no HHA eccentric control  Hamstring stretch 2x 30 reports some relief in her back following stretch Standing:   Abduction with HHA  Marching alternating with ab set with intermittent HHA 10x 3-5  Sidestep RTB around thigh   12/07/23 - Eval and prescription of HEP for initiation of strengthening/stretching with pain management    PATIENT EDUCATION:  Education details: HEP, plan of care Person educated: Patient Education method: Explanation, Demonstration, and Tactile cues Education comprehension: verbalized understanding and returned demonstration  HOME EXERCISE PROGRAM: Access Code: [email or web address] URL: [email or web address] Date: 12/07/2023 Prepared by: Leonard Raker  Exercises - Beginner Bridge  - 1 x daily - 7 x weekly - 3 sets - 10 reps - 3s hold - Straight Leg Raise  - 1 x daily - 7 x weekly - 2 sets - 10 reps - 5s hold - Seated Hamstring Stretch  - 1 x daily - 7 x weekly - 2 sets - 6 reps - 15s  hold  - Clam with Resistance  - 2 x daily - 7 x weekly - 1 sets - 10 reps - 3-5 hold - Beginner Side Leg Lift  - 1 x daily - 7 x weekly - 3 sets - 10 reps - 3-5 hold - Sit to Stand Without Arm Support  - 2 x daily - 7 x weekly - 1 sets - 10 reps  - Heel Toe Raises with Counter Support  - 1 x daily - 7 x weekly - 1-2 sets - 25 reps - Standing Terminal Knee Extension with Resistance  - 1 x daily - 7 x weekly - 3 sets - 15 reps  ASSESSMENT:  CLINICAL IMPRESSION: Pt tolerating therapy session well. Progressing interventions to mostly standing activities. Time spent educating pt on reducing standing with TKE, as observed throughout session. Appropriate fatigue experienced and proper muscle activation. Updated HEP as well with increased focus on ankle dorsiflexion strengthening to aid in eccentric control during step down. Pt tolerated training well but continues to report some  pain at end range of 2in stepdown. Pt will benefit from skilled Physical Therapy services to address deficits/limitations in order to improve functional and QOL.   Eval:  Patient is a 53 y.o. female who was seen today for physical therapy evaluation and treatment for bilateral chronic knee pain exacerbated with walking long periods, stair navigation, and standing prolonged periods. Presents with weakness in hip musculature, impaired joint integrity, decreased functional endurance, and poor biomechanics with squatting/sit to stand and with gait with good prognosis for improving pain tolerance to activity with routine skilled PT services and maintenance of compliance with HEP.   OBJECTIVE IMPAIRMENTS: Abnormal gait, decreased activity tolerance, decreased endurance, difficulty walking, decreased strength, impaired flexibility, and improper body mechanics.   ACTIVITY LIMITATIONS: carrying, standing, squatting, stairs, transfers, and locomotion level  PARTICIPATION LIMITATIONS: shopping and community activity  PERSONAL FACTORS: 3+ comorbidities: liver disease, cardiac disease, and hx of lupus are also affecting patient's functional outcome.   REHAB POTENTIAL: Good  CLINICAL DECISION  MAKING: Evolving/moderate complexity  EVALUATION COMPLEXITY: Moderate   GOALS: Goals reviewed with patient? No  SHORT TERM GOALS: Target date: 12/20/23 Pt will be independent with compliance with HEP at least 5/7 days of the week.  Baseline: prescribed Goal status: INITIAL  2.  Pt will be able to demonstrate straight leg raise 30 repetitions through full height without increased pain in back/LE to indicate compliance with HEP and improved strengthening. Baseline: decreased ROM and increased posterior hip pain on L > R Goal status: INITIAL  LONG TERM GOALS: Target date: 01/18/24  Pt will be able to progress and be independent with HEP for B knee strengthening and stretching as appropriate.  Baseline:  prescribed Goal status: INITIAL  2.  Pt will report improved functional ADL tolerance as indicated by improved LEFS score by at least 15 points.  Baseline: 27/80 Goal status: INITIAL  3.  Pt will improve her gait distance during by at least 75' with no increased pain in order to indicate improved functional BLE endurance and appropriate gait mechanics needed for functional mobility.  Baseline:  Goal status: INITIAL  4.  Pt 30s sit to stand will be improved by 4 repetitions and no increase in pain in order to indicate improved functional BLE strength.  Baseline: 9 repetitions  Goal status: INITIAL  PLAN:  PT FREQUENCY: 2x/week  PT DURATION: 6 weeks  PLANNED INTERVENTIONS: 97110-Therapeutic exercises, 97530- Therapeutic activity, 97112- Neuromuscular re-education, [phone]- Self Care, 60454- Manual therapy, [phone]- Gait training, [phone]- Orthotic Initial, [phone]- Electrical stimulation (unattended), [phone]- Electrical stimulation (manual), Patient/Family education, Balance training, Stair training, Taping, Dry Needling, Joint mobilization, and Joint manipulation  PLAN FOR NEXT SESSION:  hip/knee strengthening; core/proximal HEP engagement  Gatha Kaska PT, DPT [facility] [place] [address] [phone] office  Gatha Kaska, PT 12/20/2023, 11:44 AM

## 2023-12-25 NOTE — Progress Notes (Signed)
 Office Visit Note  Patient: Lindsay Walsh             Date of Birth: 1970/07/28           MRN: [id number]             PCP: Kahoano, Haku K, MD Referring: Kahoano, Haku K, MD Visit Date: 01/08/2024   Subjective:  Follow-up   Discussed the use of AI scribe software for clinical note transcription with the patient, who gave verbal consent to proceed.  History of Present Illness   Lindsay Walsh is a 53 y.o. female here for follow up for seronegative inflammatory arthritis on hydroxychloroquine  400 mg daily also on azathioprine  50 mg daily for autoimmune hepatitis.    She had a liver biopsy in March which showed no active autoimmune hepatitis or cirrhosis. She is currently on a low dose of Imuran  and is concerned about the possibility of her liver enzymes increasing if she discontinues the medication, although her lab results have been stable.  She is undergoing physical therapy for knee pain, which has improved her condition. Previously, she received knee injections in February, which provided significant relief. However, the pain is gradually returning, especially when descending stairs, and is located at the front of the knee.  She experiences pain in her back, hands, and wrists, with the wrist pain being significant enough to have required injections in the wrists for CTS, approximately a year ago. These injections provided relief, but the pain and numbness have returned. She reports numbness in her hands and sometimes experiences swelling in the wrist area, which exacerbates her symptoms. Numbness sometimes extends into her forearm.        Previous HPI 08/31/2023 Lindsay Walsh is a 53 y.o. female here for follow up for seronegative inflammatory arthritis on hydroxychloroquine  400 mg daily also on azathioprine  50 mg daily for autoimmune hepatitis.     She has ongoing issues with her knees, worse in the right knee as before but now also involving the left knee, which is  tender and painful. The pain is located around the patella and is associated with crepitus. She has a history of bone spurs at the superior aspect of the patella. The pain sometimes causes her knee to give out, especially when descending stairs, necessitating support. No swelling is noted, but the area is tender. She is starting to walk on her treadmill as part of her physical activity.   She discusses her back issues, mentioning a previous appointment with Dr. Gillie who dismissed her concerns due to mild findings on lumbar MRI. She experiences numbness in her left arm and hand, which her cardiologist suggested might be related to her back and neck rather than her heart. She is in the process of seeking a second opinion regarding her back issues.   She is currently taking Plaquenil  and Imuran . She is on a low dose of Imuran , taking 50 mg daily, and is concerned about stopping it due to her history of liver failure. Her liver has been stable for years on this medication.       Previous HPI 03/28/2023 Lindsay Walsh is a 53 y.o. female here for follow up for seronegative inflammatory arthritis on hydroxychloroquine  400 mg daily also on azathioprine  50 mg daily for autoimmune hepatitis.  Overall feels like her arthritis problems are doing pretty well except with right knee pain.  This is aggravating more on some days especially like with weather change and is worse during  movements requiring deep bending and getting up from low positions or climbing stairs.  Carpal tunnel symptoms are still doing better she is noticing some partial increase coming back recently.  She saw Dr. Gillie had MRI of the lumbar spine there was facet arthropathy noted and has follow-up scheduled may be pursuing local injections for this soon.     Previous HPI 09/25/2022 Lindsay Walsh is a 53 y.o. female here for follow up for seronegative inflammatory arthritis on hydroxychloroquine  400 mg daily.  Her hand and wrist  symptoms have remained well improved after steroid injection in September.  However she is having more trouble with some joint pains predominantly on the left side.  Left shoulder is causing pain for months that is persistent she cannot raise it above horizontal without significant pain and frequently gets abrupt pain and awakens her from sleep if she rolls onto the left side at night. She had this evaluated including xray last month and was told it showed calcification. Referred to PT but has not been able to get any scheduled with her insurance so far. In the past 1 month is having more left hip pain on the side of the hip also gets worse with pressure on that area but also notices it just getting up and down from seated positions not as often while walking.  She has had some persistent bruises on the right arm present for several months neither resolving nor expanding.  She also had heart catheterization with diagnoses of coronary artery disease started on Repatha.  She is scheduled for ophthalmology follow-up next month with [facility].   Previous HPI 03/20/22 Lindsay Walsh is a 53 y.o. female here for follow up for suspected seronegative arthritis with positive RNP Abs and autoimmune hepatitis. She started taking hydroxychloroquine  200 mg daily for this and feels her symptoms are overall somewhat improved. She still has numbness and joint pains in bilateral hands. Not seeing significant swelling and no rashes. Labs reviewed from 8/21 at [facility] including CBC and LFTs were normal.   Previous HPI 11/16/2021 Lindsay Walsh is a 53 y.o. female here for follow up for abnormal lab findings and continued ongoing joint pain in multiple areas.  After our last visit did not recommend any specific new anti-inflammatory medication.  She feels overall symptoms are doing worse compared to a year ago.  She has pain pretty much all over.  Some days she feels a sensitivity and pain other times feels like she has more  numbness or diminished sensation throughout.  She is getting swelling in her hands and feet that is worse by the end of the day.  She keeps persistent fatigue usually regardless of her sleep quality.  Mixed constipation and diarrhea symptoms.  Repeat laboratory testing by her pain management provider concerning for worsening lab markers for connective tissue disease or inflammation.  Liver function test been remaining stable on Imuran .   Previous HPI 06/16/2020 Lindsay Walsh is a 53 y.o. female here for follow up with hand pain and sometimes all over pain, positive RNP antibodies here for repeat assessment for inflammatory joint pain, nailfold capillaroscopy, and positive RNP test. She continues to have pain in multiple places including her hands, knees, also neck and upper back. No signficant changes since her initial visit.   Review of Systems  Constitutional:  Positive for fatigue.  HENT:  Positive for mouth sores and mouth dryness.   Eyes:  Positive for dryness.  Respiratory:  Positive for shortness of  breath.   Cardiovascular:  Positive for chest pain and palpitations.  Gastrointestinal:  Positive for diarrhea. Negative for blood in stool and constipation.  Endocrine: Negative for increased urination.  Genitourinary:  Negative for involuntary urination.  Musculoskeletal:  Positive for joint pain, gait problem, joint pain, joint swelling, myalgias, muscle weakness, morning stiffness, muscle tenderness and myalgias.  Skin:  Positive for sensitivity to sunlight. Negative for color change, rash and hair loss.  Allergic/Immunologic: Positive for susceptible to infections.  Neurological:  Positive for dizziness. Negative for headaches.  Hematological:  Negative for swollen glands.  Psychiatric/Behavioral:  Positive for depressed mood and sleep disturbance. The patient is nervous/anxious.     PMFS History:  Patient Active Problem List   Diagnosis Date Noted   Abdominal bloating 10/10/2023    Pain in right knee 03/28/2023   Diabetes mellitus without complication (HCC) 03/27/2023   Liver fibrosis 03/02/2023   Pain in left shoulder 09/25/2022   Progressive angina (HCC)    Coronary artery calcification seen on CT scan    History of nuclear stress test    Abnormal nuclear stress test 04/11/2022   Atypical angina (HCC) 04/11/2022   Bilateral carpal tunnel syndrome 04/05/2022   Positive ANA (antinuclear antibody) 11/16/2021   History of colonic polyps 11/15/2021   Bilateral hand pain 06/02/2020   OAB (overactive bladder) 11/05/2019   Recurrent UTI 11/05/2019   Diabetic gastroparesis (HCC) 08/21/2019   Diarrhea 07/15/2019   Abnormal finding on urinalysis 07/15/2019   Morbid (severe) obesity due to excess calories (HCC)    Major depressive disorder, single episode, unspecified    Restless legs syndrome    Sleep apnea    Fibromyalgia    Hereditary and idiopathic neuropathy, unspecified    Nonspecific mesenteric lymphadenitis    Pain in right foot    Type 2 diabetes mellitus with hyperglycemia (HCC)    Hypothyroidism 08/22/2018   DM type 2 causing vascular disease (HCC) 08/21/2018   Hyperlipidemia associated with type 2 diabetes mellitus (HCC) 08/21/2018   Essential hypertension, benign 08/21/2018   Influenza A 08/10/2018   SIRS (systemic inflammatory response syndrome) (HCC) 08/10/2018   Cerebral infarction (HCC) 03/05/2015   Dyspareunia 05/06/2014   Obesity 09/30/2013   Bilateral hip pain 07/16/2013   Chronic pain syndrome 07/16/2013   Degenerative disc disease, lumbar 07/16/2013   Greater trochanteric bursitis of both hips 07/16/2013   Nightmares associated with chronic post-traumatic stress disorder 10/18/2012   Sprain of foot 03/14/2012   Weakness 01/29/2012   Irritable bowel syndrome with diarrhea 08/01/2011   Gastroesophageal reflux disease    Depression    Chest pain    Fasting hyperglycemia    TOBACCO ABUSE 04/28/2010   NARCOTIC ABUSE 04/28/2010    PULMONARY NODULE 04/28/2010   Autoimmune hepatitis (HCC) 04/28/2010   Myalgia and myositis 01/06/2009    Past Medical History:  Diagnosis Date   Acid reflux    Allergic rhinitis due to pollen    Anxiety    Arthritis    back   Autoimmune hepatitis (HCC)    Back pain    Connective tissue disease (HCC)    Depression    Dizziness and giddiness    Dyspareunia 05/06/2014   Dysrhythmia    palpatations; on propafenone   Essential (primary) hypertension    Fibromyalgia    Gastroesophageal reflux disease    Chronic abdominal pain; gastroparesis; globus hystericus; irritable bowel syndrome   Glaucoma    Hereditary and idiopathic neuropathy, unspecified    Hyperlipidemia  Irritable bowel syndrome    Major depressive disorder, single episode, unspecified    Morbid (severe) obesity due to excess calories (HCC)    Narcotic dependence (HCC)    Nonspecific mesenteric lymphadenitis    Overweight(278.02)    Pain in limb    Pain in right foot    PTSD (post-traumatic stress disorder)    Pulmonary nodule    Restless legs syndrome    Sleep apnea    on CPAP machine   Stroke (HCC) 2016   Systemic lupus erythematosus (SLE) in adult [facility])    Tobacco abuse    one pack per day; 35 pack years; quit in February 2023   Type 2 diabetes mellitus with hyperglycemia (HCC)     Family History  Problem Relation Age of Onset   Diabetes Mother    Hypertension Mother    Anxiety disorder Mother    Depression Mother    Drug abuse Mother    Heart disease Father    Diabetes Father    Anxiety disorder Father    Alcohol abuse Father    Depression Father    OCD Father    Thyroid  disease Sister    Healthy Brother    Healthy Daughter    Healthy Son    ADD / ADHD Son    Alcohol abuse Paternal Grandfather    Depression Paternal Grandfather    Cancer Paternal Grandfather        lung,skin   Tuberculosis Paternal Grandfather    Seizures Paternal Grandmother    Lupus Paternal Grandmother    ADD / ADHD  Son    Anesthesia problems Neg Hx    Malignant hyperthermia Neg Hx    Pseudochol deficiency Neg Hx    Hypotension Neg Hx    Bipolar disorder Neg Hx    Dementia Neg Hx    Paranoid behavior Neg Hx    Schizophrenia Neg Hx    Sexual abuse Neg Hx    Physical abuse Neg Hx    Past Surgical History:  Procedure Laterality Date   ABDOMINAL HYSTERECTOMY     TAH&BSO   BACK SURGERY     BLADDER SUSPENSION  09/12/2011   Procedure: TRANSVAGINAL TAPE (TVT) PROCEDURE;  Surgeon: Emery LILLETTE Blaze, MD;  Location: [facility];  Service: Urology;  Laterality: N/A;   CESAREAN SECTION     X3   CHOLECYSTECTOMY     CORONARY PRESSURE/FFR STUDY N/A 04/21/2022   Procedure: INTRAVASCULAR PRESSURE WIRE/FFR STUDY;  Surgeon: Anner Alm ORN, MD;  Location: [facility] INVASIVE CV LAB;  Service: Cardiovascular;  Laterality: N/A;   ESOPHAGOGASTRODUODENOSCOPY ENDOSCOPY     throat stretched per pt.   LEFT HEART CATH AND CORONARY ANGIOGRAPHY N/A 04/21/2022   Procedure: LEFT HEART CATH AND CORONARY ANGIOGRAPHY;  Surgeon: Anner Alm ORN, MD;  Location: [facility] INVASIVE CV LAB;  Service: Cardiovascular;  Laterality: N/A;   LIVER BIOPSY     x4   LUMBAR EPIDURAL INJECTION  01/2012   TOTAL ABDOMINAL HYSTERECTOMY W/ BILATERAL SALPINGOOPHORECTOMY     TUBAL LIGATION     Bilateral   UPPER GASTROINTESTINAL ENDOSCOPY  09/13/2010   Social History   Social History Narrative   Divorced since 1994.Lives with boyfriend of 11 years.On disability.   Immunization History  Administered Date(s) Administered   Hepatitis A, Adult 06/24/2020, 02/27/2022   Influenza Inj Mdck Quad Pf 04/20/2016   Influenza Split 04/07/2013, 05/17/2017   Influenza,inj,Quad PF,6+ Mos 05/17/2017, 04/04/2018, 03/31/2019, 04/28/2019   Influenza-Unspecified 04/17/2012, 03/20/2014, 08/09/2014, 05/06/2015, 06/24/2020  Moderna Sars-Covid-2 Vaccination 02/06/2020, 03/05/2020   Pneumococcal Conjugate-13 12/18/2019   Pneumococcal Polysaccharide-23 04/20/2016      Objective: Vital Signs: BP (!) 144/82 (BP Location: Left Arm, Patient Position: Sitting, Cuff Size: Normal)   Pulse 93   Resp 14   Ht 5' (1.524 m)   Wt 194 lb (88 kg)   BMI 37.89 kg/m    Physical Exam Constitutional:      Appearance: She is obese.  Eyes:     Conjunctiva/sclera: Conjunctivae normal.  Cardiovascular:     Rate and Rhythm: Normal rate and regular rhythm.  Pulmonary:     Effort: Pulmonary effort is normal.     Breath sounds: Normal breath sounds.  Musculoskeletal:     Right lower leg: No edema.     Left lower leg: No edema.  Lymphadenopathy:     Cervical: No cervical adenopathy.  Skin:    General: Skin is warm and dry.     Findings: No rash.  Neurological:     Mental Status: She is alert.  Psychiatric:        Mood and Affect: Mood normal.      Musculoskeletal Exam:  Shoulders full ROM some pain with overhead abduction, no swelling or tenderness to palpation Elbows full ROM no tenderness or swelling Painful to percussion over flexor side of both wrists, with some radiation into fingers Fingers full ROM no tenderness to pressure over the MCP joints of both hands but no palpable swelling Left lateral hip tenderness to pressure Right knee patellofemoral crepitus, no effusions, anterior joint line tenderness to pressure   Investigation: No additional findings.  Imaging: No results found.  Recent Labs: Lab Results  Component Value Date   WBC 6.4 01/08/2024   HGB 13.2 01/08/2024   PLT 251 01/08/2024   NA 139 01/08/2024   K 4.3 01/08/2024   CL 104 01/08/2024   CO2 27 01/08/2024   GLUCOSE 114 (H) 01/08/2024   BUN 9 01/08/2024   CREATININE 0.78 01/08/2024   BILITOT 0.6 01/08/2024   ALKPHOS 105 03/06/2023   AST 25 01/08/2024   ALT 19 01/08/2024   PROT 6.5 01/08/2024   ALBUMIN 4.1 03/06/2023   CALCIUM  9.2 01/08/2024   GFRAA 101 08/03/2020    Speciality Comments: PLQ Eye Exam 11/14/2022 normal Dr. Octavia f/u 6 months  Procedures:  No  procedures performed Allergies: Doxycycline, Fentanyl, Cefoxitin, Ceftin [cefuroxime axetil], Iodinated contrast media, Iohexol , Norvasc [amlodipine besylate], Trintellix  [vortioxetine ], Wellbutrin [bupropion hcl], Latex, and Tape   Assessment / Plan:     Visit Diagnoses: Positive ANA (antinuclear antibody) - Plan: Sedimentation rate Seronegative rheumatoid arthritis Joint inflammation appears overall well-controlled with no peripheral synovitis or dactylitis appreciable on exam.  Has chronic persistent back and hip pains more degenerative disc disease and muscular related.  We talked about whether the low-dose azathioprine  is probably having a large impact on joints I suspect the dose is low enough that it is not but would defer to gastroenterology follow-up about stopping discontinuing this medication. - Recheck sed rate for inflammatory disease activity monitoring - Continue hydroxychloroquine  400 mg daily - On azathioprine  50 mg daily, could try discontinuing if not felt important by hepatology at this time  Autoimmune hepatitis (HCC) - Plan: Sedimentation rate  High risk medication use - hydroxychloroquine  400 mg daily, PLQ Eye Exam 11/14/2022 normal. Consider discontinuing Imuran  if labs remain norma - Plan: CBC with Differential/Platelet, Comprehensive metabolic panel with GFR Tolerating medication well without new complaints.  No serious interval infections.  Most recent hydroxychloroquine  eye exam find will need to be updated this year. - Checking CBC and CMP for medication monitoring on continued long-term use of hydroxychloroquine  and azathioprine   Knee pain with patellofemoral involvement Knee pain improved with physical therapy and injections. Pain on stair descent suggests patellofemoral involvement. Advised avoiding knee locking to reduce strain. - S/P Large Joint Inj: bilateral knee on 08/31/2023. Referral to physical therapy, referral placed 08/31/2023.  Carpal tunnel  syndrome Carpal tunnel syndrome with numbness and pain due to median nerve compression. Previous steroid injections provided relief for about a year and were well-tolerated.  Currently without major red flags of persistent neurologic deficits or any strength loss or muscle atrophy.  I recommended we could try repeat ultrasound-guided steroid injections will need to follow-up soon for separate procedure visit with availability of ultrasound machine and staff.  Chronic back pain Degeneration of intervertebral disc of lumbar region with discogenic back pain - Plan: Ambulatory referral to Physical Medicine Rehab Chronic back pain with no surgical red flags. Conservative management recommended.  She is interested in minimizing medications rule out of concern about side effects due to liver disease. - Refer to orthopedic or physiatry specialist for conservative management.        Orders: Orders Placed This Encounter  Procedures   Sedimentation rate   CBC with Differential/Platelet   Comprehensive metabolic panel with GFR   Ambulatory referral to Physical Medicine Rehab   No orders of the defined types were placed in this encounter.    Follow-Up Instructions: Return in about 6 months (around 07/10/2024) for RA on HCQ f/u 6mos.   Lonni LELON Ester, MD  Note - This record has been created using [employer].  Chart creation errors have been sought, but may not always  have been located. Such creation errors do not reflect on  the standard of medical care.

## 2023-12-26 ENCOUNTER — Ambulatory Visit (HOSPITAL_COMMUNITY)

## 2023-12-26 ENCOUNTER — Encounter (HOSPITAL_COMMUNITY): Payer: Self-pay

## 2023-12-26 DIAGNOSIS — G8929 Other chronic pain: Secondary | ICD-10-CM

## 2023-12-26 DIAGNOSIS — M25562 Pain in left knee: Secondary | ICD-10-CM | POA: Diagnosis not present

## 2023-12-26 NOTE — Therapy (Signed)
 OUTPATIENT PHYSICAL THERAPY LOWER EXTREMITY TREATMENT   Patient Name: Lindsay Walsh MRN: [id number] DOB:[birth year], 53 y.o., female Today's Date: 12/26/2023  END OF SESSION:  PT End of Session - 12/26/23 1519     Visit Number 4    Number of Visits 12    Date for PT Re-Evaluation 01/18/24    Authorization Type [employer]    Authorization Time Period No auth required    Progress Note Due on Visit 10    PT Start Time 1519    PT Stop Time 1558    PT Time Calculation (min) 39 min    Activity Tolerance Patient tolerated treatment well    Behavior During Therapy WFL for tasks assessed/performed           Past Medical History:  Diagnosis Date   Acid reflux    Allergic rhinitis due to pollen    Anxiety    Arthritis    back   Autoimmune hepatitis (HCC)    Back pain    Connective tissue disease (HCC)    Depression    Dizziness and giddiness    Dyspareunia 05/06/2014   Dysrhythmia    palpatations; on propafenone   Essential (primary) hypertension    Fibromyalgia    Gastroesophageal reflux disease    Chronic abdominal pain; gastroparesis; globus hystericus; irritable bowel syndrome   Glaucoma    Hereditary and idiopathic neuropathy, unspecified    Hyperlipidemia    Irritable bowel syndrome    Major depressive disorder, single episode, unspecified    Morbid (severe) obesity due to excess calories (HCC)    Narcotic dependence (HCC)    Nonspecific mesenteric lymphadenitis    Overweight(278.02)    Pain in limb    Pain in right foot    PTSD (post-traumatic stress disorder)    Pulmonary nodule    Restless legs syndrome    Sleep apnea    on CPAP machine   Stroke (HCC) 2016   Systemic lupus erythematosus (SLE) in adult [facility])    Tobacco abuse    one pack per day; 35 pack years; quit in February 2023   Type 2 diabetes mellitus with hyperglycemia (HCC)    Past Surgical History:  Procedure Laterality Date   ABDOMINAL HYSTERECTOMY     TAH&BSO   BACK  SURGERY     BLADDER SUSPENSION  09/12/2011   Procedure: TRANSVAGINAL TAPE (TVT) PROCEDURE;  Surgeon: Reggie Caper, MD;  Location: [facility];  Service: Urology;  Laterality: N/A;   CESAREAN SECTION     X3   CHOLECYSTECTOMY     CORONARY PRESSURE/FFR STUDY N/A 04/21/2022   Procedure: INTRAVASCULAR PRESSURE WIRE/FFR STUDY;  Surgeon: Arleen Lacer, MD;  Location: [facility] INVASIVE CV LAB;  Service: Cardiovascular;  Laterality: N/A;   ESOPHAGOGASTRODUODENOSCOPY ENDOSCOPY     throat stretched per pt.   LEFT HEART CATH AND CORONARY ANGIOGRAPHY N/A 04/21/2022   Procedure: LEFT HEART CATH AND CORONARY ANGIOGRAPHY;  Surgeon: Arleen Lacer, MD;  Location: [facility] INVASIVE CV LAB;  Service: Cardiovascular;  Laterality: N/A;   LIVER BIOPSY     x4   LUMBAR EPIDURAL INJECTION  01/2012   TOTAL ABDOMINAL HYSTERECTOMY W/ BILATERAL SALPINGOOPHORECTOMY     TUBAL LIGATION     Bilateral   UPPER GASTROINTESTINAL ENDOSCOPY  09/13/2010   Patient Active Problem List   Diagnosis Date Noted   Abdominal bloating 10/10/2023   Pain in right knee 03/28/2023   Diabetes mellitus without complication (HCC) 03/27/2023   Liver fibrosis  03/02/2023   Pain in left shoulder 09/25/2022   Progressive angina (HCC)    Coronary artery calcification seen on CT scan    History of nuclear stress test    Abnormal nuclear stress test 04/11/2022   Atypical angina (HCC) 04/11/2022   Bilateral carpal tunnel syndrome 04/05/2022   Positive ANA (antinuclear antibody) 11/16/2021   History of colonic polyps 11/15/2021   Bilateral hand pain 06/02/2020   OAB (overactive bladder) 11/05/2019   Recurrent UTI 11/05/2019   Diabetic gastroparesis (HCC) 08/21/2019   Diarrhea 07/15/2019   Abnormal finding on urinalysis 07/15/2019   Morbid (severe) obesity due to excess calories (HCC)    Major depressive disorder, single episode, unspecified    Restless legs syndrome    Sleep apnea    Fibromyalgia    Hereditary and idiopathic neuropathy,  unspecified    Nonspecific mesenteric lymphadenitis    Pain in right foot    Type 2 diabetes mellitus with hyperglycemia (HCC)    Hypothyroidism 08/22/2018   DM type 2 causing vascular disease (HCC) 08/21/2018   Hyperlipidemia associated with type 2 diabetes mellitus (HCC) 08/21/2018   Essential hypertension, benign 08/21/2018   Influenza A 08/10/2018   SIRS (systemic inflammatory response syndrome) (HCC) 08/10/2018   Cerebral infarction (HCC) 03/05/2015   Dyspareunia 05/06/2014   Obesity 09/30/2013   Bilateral hip pain 07/16/2013   Chronic pain syndrome 07/16/2013   Degenerative disc disease, lumbar 07/16/2013   Greater trochanteric bursitis of both hips 07/16/2013   Nightmares associated with chronic post-traumatic stress disorder 10/18/2012   Sprain of foot 03/14/2012   Weakness 01/29/2012   Irritable bowel syndrome with diarrhea 08/01/2011   Gastroesophageal reflux disease    Depression    Chest pain    Fasting hyperglycemia    TOBACCO ABUSE 04/28/2010   NARCOTIC ABUSE 04/28/2010   PULMONARY NODULE 04/28/2010   Autoimmune hepatitis (HCC) 04/28/2010   Myalgia and myositis 01/06/2009    PCP: Kahoano, Haku K MD  REFERRING PROVIDER: Matt Song, MD  REFERRING DIAG:  [phone] (ICD-10-CM) - Chronic pain of right knee  M25.562,G89.29 (ICD-10-CM) - Chronic pain of left knee    THERAPY DIAG:  Chronic pain of left knee  Chronic pain of right knee  Rationale for Evaluation and Treatment: Rehabilitation  ONSET DATE: many years ago with increased lately  SUBJECTIVE:   SUBJECTIVE STATEMENT: 12/26/23:  Reports increased pain internittent upper back that can get up to 9/10.  Pain scale 1/10 Bil knee pain.  Continues to have most difficulty descending stairs.    Eval:  Both knees hurt about the same and it will just hit. The MD was also talking about her neck/back that is bothering her but she's here for referral for knees today. Has difficulty with walking,  stairs and sometimes they give out on her. Would love to be able to get around and have less pain and manage her knee pain with exercises and strengthening them.   PERTINENT HISTORY: Heart; liver disease; PTSD/MDD; diabetic gastroparesis; hyperlipidemia; hx of lupus PAIN:  Are you having pain? Yes: NPRS scale: 2 current - 9 when going up/down steps - 0 when sitting and depends on the weather Pain location: around the kneecaps Pain description: achy when sitting; going down stairs is sharp/stabbing Aggravating factors: walking, stairs Relieving factors: sitting sometimes, biofreeze, resting, knee brace  PRECAUTIONS: Fall  RED FLAGS: None   WEIGHT BEARING RESTRICTIONS: No  FALLS:  Has patient fallen in last 6 months? Yes. Number of falls 1 about  6 months ago when her knees just gave out on her with a pain and she fell down the steps  LIVING ENVIRONMENT: Lives with: lives with their partner Lives in: House/apartment Stairs: Yes: External: 6 steps; can reach both Has following equipment at home: Single point cane and compression sleeve knee brace (both)  OCCUPATION: disability  PLOF: Independent with basic ADLs, Needs assistance with homemaking, and boyfriend helps with lifting/carrying  PATIENT GOALS: Learn how to cope with pain in the knees as well as get them stronger, especially when she's in pain.   NEXT MD VISIT: TBD  OBJECTIVE:  Note: Objective measures were completed at Evaluation unless otherwise noted.  DIAGNOSTIC FINDINGS: XR L knee -Mild medial and patellofemoral compartment osteoarthritis, enthesophyte may also indicate chronic or quadriceps tendonitis  XR R knee - Mild appearing osteoarthritis, large patellar enthesophyte suggestive for  chronic quadriceps tendinitis or enthesitis   PATIENT SURVEYS:  LEFS  Survey date:    Score total:  27/80 - 33.8%    COGNITION: Overall cognitive status: Within functional limits for tasks assessed     SENSATION: Tingling  in the bottom of her feet mostly but going up to leg sometimes; also UE N/T - neuropathy   MUSCLE LENGTH: Hamstrings: bilateral tightness noted with increased tightness R > L  POSTURE: rounded shoulders, forward head, and increased thoracic kyphosis  PALPATION:    LOWER EXTREMITY ROM:  Active ROM Right eval Left eval  Hip flexion    Hip extension    Hip abduction    Hip adduction    Hip internal rotation    Hip external rotation    Knee flexion    Knee extension    Ankle dorsiflexion    Ankle plantarflexion    Ankle inversion    Ankle eversion     (Blank rows = not tested)  LOWER EXTREMITY MMT:  MMT Right eval Left eval  Hip flexion 4- 4-  Hip extension    Hip abduction 4- 4-  Hip adduction 4- 4-  Hip internal rotation    Hip external rotation    Knee flexion 4- 4-  Knee extension 4 4  Ankle dorsiflexion 4 4-  Ankle plantarflexion    Ankle inversion    Ankle eversion     (Blank rows = not tested)  FUNCTIONAL TESTS:  5 times sit to stand: 17.3 s  30 seconds chair stand test 9 repetitions (pain went from 2/10 to a 4/10 mostly in the L knee)  Normative values for the 5TSTS according to research:  50-59 years: 7.7  2.6 seconds.   GAIT: Distance walked: 71' w/ no increased in pain (maintained 2/10 pain)  Comments: Increased hip adduction and excessive pronation bilaterally; genu valgum bilaterally; trendelenburg; decreased trunk rotation/arm swing  TREATMENT DATE:  12/26/23: Bike full revolution x 5' L3 resistance Standing  - Heel raise with incline slope 20x   - Toe raise with decline slope  - Step up 4 then 6in 2 then 1 hand held assistance 10x each LE  - Lateral step up 4in step height with 1 finger A  - Forward step down 4in with 2 HHA  - SLS Rt: 17, Lt 19  - Tandem stance 1x 30 on solid suface, 2x 30 on foam  - Squat  front of chair  - Slant board 2x 30   12/20/2023  -recumbent bike x 4' (seat 3) -Standing hip abduction isometric with HHA x 12 with 3'' isometric -Standing eccentric step down with cues for hip hinge on 4in box BUE on // bars 2x15 -Standing TKE with RTB 2x15 with BUE support -2in eccentric stepdown x 10 with single UE assist - Standing ankle DF against wall x 25 -Bosu ball lunges with hovering hands x 10 bilaterally 3-5'' hold  12/18/23: Reviewed goals Educated importance of HEP compliance for maximal benefits  Supine:  Bridge 10x  SLR 10x Sidelying:  Clam with RTB  Abduction Seated:  STS no HHA eccentric control  Hamstring stretch 2x 30 reports some relief in her back following stretch Standing:   Abduction with HHA  Marching alternating with ab set with intermittent HHA 10x 3-5  Sidestep RTB around thigh   12/07/23 - Eval and prescription of HEP for initiation of strengthening/stretching with pain management    PATIENT EDUCATION:  Education details: HEP, plan of care Person educated: Patient Education method: Explanation, Demonstration, and Tactile cues Education comprehension: verbalized understanding and returned demonstration  HOME EXERCISE PROGRAM: Access Code: [email or web address] URL: [email or web address] Date: 12/07/2023 Prepared by: Leonard Raker  Exercises - Beginner Bridge  - 1 x daily - 7 x weekly - 3 sets - 10 reps - 3s hold - Straight Leg Raise  - 1 x daily - 7 x weekly - 2 sets - 10 reps - 5s hold - Seated Hamstring Stretch  - 1 x daily - 7 x weekly - 2 sets - 6 reps - 15s  hold  - Clam with Resistance  - 2 x daily - 7 x weekly - 1 sets - 10 reps - 3-5 hold - Beginner Side Leg Lift  - 1 x daily - 7 x weekly - 3 sets - 10 reps - 3-5 hold - Sit to Stand Without Arm Support  - 2 x daily - 7 x weekly - 1 sets - 10 reps  - Heel Toe Raises with Counter Support  - 1 x daily - 7 x weekly - 1-2 sets - 25 reps - Standing Terminal Knee Extension  with Resistance  - 1 x daily - 7 x weekly - 3 sets - 15 reps  12/26/23: - Single Leg Stance  - 2 x daily - 7 x weekly - 1 sets - 3 reps - 30 hold - Standing Tandem Balance with Counter Support  - 2 x daily - 7 x weekly - 1 sets - 3 reps - 30 hold  ASSESSMENT:  CLINICAL IMPRESSION:  12/26/23: Session focus with functional strengthening that was tolerated well.  Added squats for functional strengthening with multimodal cueing for proper form and mechanics.  Included balance training this session as well with good static tandem stance, increased challenge with foam required intermittent HHA.  Pt stated pain only Lt knee during step down training, was able to increased step height to  improve eccentric control.  Added static balance activities to HEP, encouraged to complete near counter for safety with verbalized understanding.    Eval:  Patient is a 53 y.o. female who was seen today for physical therapy evaluation and treatment for bilateral chronic knee pain exacerbated with walking long periods, stair navigation, and standing prolonged periods. Presents with weakness in hip musculature, impaired joint integrity, decreased functional endurance, and poor biomechanics with squatting/sit to stand and with gait with good prognosis for improving pain tolerance to activity with routine skilled PT services and maintenance of compliance with HEP.   OBJECTIVE IMPAIRMENTS: Abnormal gait, decreased activity tolerance, decreased endurance, difficulty walking, decreased strength, impaired flexibility, and improper body mechanics.   ACTIVITY LIMITATIONS: carrying, standing, squatting, stairs, transfers, and locomotion level  PARTICIPATION LIMITATIONS: shopping and community activity  PERSONAL FACTORS: 3+ comorbidities: liver disease, cardiac disease, and hx of lupus are also affecting patient's functional outcome.   REHAB POTENTIAL: Good  CLINICAL DECISION MAKING: Evolving/moderate complexity  EVALUATION  COMPLEXITY: Moderate   GOALS: Goals reviewed with patient? No  SHORT TERM GOALS: Target date: 12/20/23 Pt will be independent with compliance with HEP at least 5/7 days of the week.  Baseline: prescribed Goal status: INITIAL  2.  Pt will be able to demonstrate straight leg raise 30 repetitions through full height without increased pain in back/LE to indicate compliance with HEP and improved strengthening. Baseline: decreased ROM and increased posterior hip pain on L > R Goal status: INITIAL  LONG TERM GOALS: Target date: 01/18/24  Pt will be able to progress and be independent with HEP for B knee strengthening and stretching as appropriate.  Baseline: prescribed Goal status: INITIAL  2.  Pt will report improved functional ADL tolerance as indicated by improved LEFS score by at least 15 points.  Baseline: 27/80 Goal status: INITIAL  3.  Pt will improve her gait distance during by at least 75' with no increased pain in order to indicate improved functional BLE endurance and appropriate gait mechanics needed for functional mobility.  Baseline:  Goal status: INITIAL  4.  Pt 30s sit to stand will be improved by 4 repetitions and no increase in pain in order to indicate improved functional BLE strength.  Baseline: 9 repetitions  Goal status: INITIAL  PLAN:  PT FREQUENCY: 2x/week  PT DURATION: 6 weeks  PLANNED INTERVENTIONS: 97110-Therapeutic exercises, 97530- Therapeutic activity, [id number]- Neuromuscular re-education, 97535- Self Care, [id number]- Manual therapy, [phone]- Gait training, [phone]- Orthotic Initial, [phone]- Electrical stimulation (unattended), [phone]- Electrical stimulation (manual), Patient/Family education, Balance training, Stair training, Taping, Dry Needling, Joint mobilization, and Joint manipulation  PLAN FOR NEXT SESSION:  hip/knee strengthening; core/proximal HEP engagement.  Add vector stance next session.  Minor Amble, LPTA/CLT; CBIS [phone]  Alesia Anchors, PTA 12/26/2023, 4:12 PM

## 2023-12-27 ENCOUNTER — Telehealth (HOSPITAL_COMMUNITY): Admitting: Psychiatry

## 2023-12-27 ENCOUNTER — Encounter (HOSPITAL_COMMUNITY): Payer: Self-pay | Admitting: Psychiatry

## 2023-12-27 DIAGNOSIS — F4310 Post-traumatic stress disorder, unspecified: Secondary | ICD-10-CM

## 2023-12-27 DIAGNOSIS — F900 Attention-deficit hyperactivity disorder, predominantly inattentive type: Secondary | ICD-10-CM

## 2023-12-27 DIAGNOSIS — F419 Anxiety disorder, unspecified: Secondary | ICD-10-CM | POA: Diagnosis not present

## 2023-12-27 DIAGNOSIS — F4312 Post-traumatic stress disorder, chronic: Secondary | ICD-10-CM

## 2023-12-27 DIAGNOSIS — F331 Major depressive disorder, recurrent, moderate: Secondary | ICD-10-CM | POA: Diagnosis not present

## 2023-12-27 DIAGNOSIS — F515 Nightmare disorder: Secondary | ICD-10-CM | POA: Diagnosis not present

## 2023-12-27 MED ORDER — AMPHETAMINE-DEXTROAMPHETAMINE 20 MG PO TABS: 20.0000 mg | ORAL_TABLET | Freq: Two times a day (BID) | ORAL | 0 refills | Status: DC

## 2023-12-27 MED ORDER — PRAZOSIN HCL 2 MG PO CAPS: ORAL_CAPSULE | ORAL | 2 refills | Status: DC

## 2023-12-27 MED ORDER — PAROXETINE HCL 40 MG PO TABS: 40.0000 mg | ORAL_TABLET | Freq: Every day | ORAL | 2 refills | Status: DC

## 2023-12-27 MED ORDER — ARIPIPRAZOLE 2 MG PO TABS: 2.0000 mg | ORAL_TABLET | Freq: Every day | ORAL | 2 refills | Status: DC

## 2023-12-27 NOTE — Progress Notes (Signed)
 Virtual Visit via Video Note  I connected with Lindsay Walsh on 12/27/23 at  2:20 PM EDT by a video enabled telemedicine application and verified that I am speaking with the correct person using two identifiers.  Location: Patient: home Provider: office   I discussed the limitations of evaluation and management by telemedicine and the availability of in person appointments. The patient expressed understanding and agreed to proceed.      I discussed the assessment and treatment plan with the patient. The patient was provided an opportunity to ask questions and all were answered. The patient agreed with the plan and demonstrated an understanding of the instructions.   The patient was advised to call back or seek an in-person evaluation if the symptoms worsen or if the condition fails to improve as anticipated.  I provided 20 minutes of non-face-to-face time during this encounter.   Alfredia Annas, MD  [facility] MD/PA/NP OP Progress Note  12/27/2023 2:29 PM Lindsay Walsh  MRN:  [id number]  Chief Complaint:  Chief Complaint  Patient presents with   Depression   Anxiety   Follow-up   ADD   HPI: This patient is a 53 year old divorced white female who lives with her boyfriend in [place]. She is on disability for an autoimmune liver disease. She has 3 children and 1 granddaughter   The patient returns for follow-up after 3 months regarding her depression anxiety PTSD and ADD.  She states that since I added Abilify  to her regimen a few months ago she has been doing much better.  She denies significant depression or anxiety.  She is sleeping well.  She is focusing well with the Adderall.  She is no longer having nightmares with the prazosin .  She denies any thoughts of self-harm or suicide.  She is currently in physical therapy because of pain in her knees. Visit Diagnosis:    ICD-10-CM   1. Recurrent moderate major depressive disorder with anxiety (HCC)  F33.1    F41.9     2.  Nightmares associated with chronic post-traumatic stress disorder  F51.5 prazosin  (MINIPRESS ) 2 MG capsule   F43.12     3. Attention deficit hyperactivity disorder (ADHD), predominantly inattentive type  F90.0     4. PTSD (post-traumatic stress disorder)  F43.10       Past Psychiatric History: Hospitalization in her early 54s for depression  Past Medical History:  Past Medical History:  Diagnosis Date   Acid reflux    Allergic rhinitis due to pollen    Anxiety    Arthritis    back   Autoimmune hepatitis (HCC)    Back pain    Connective tissue disease (HCC)    Depression    Dizziness and giddiness    Dyspareunia 05/06/2014   Dysrhythmia    palpatations; on propafenone   Essential (primary) hypertension    Fibromyalgia    Gastroesophageal reflux disease    Chronic abdominal pain; gastroparesis; globus hystericus; irritable bowel syndrome   Glaucoma    Hereditary and idiopathic neuropathy, unspecified    Hyperlipidemia    Irritable bowel syndrome    Major depressive disorder, single episode, unspecified    Morbid (severe) obesity due to excess calories (HCC)    Narcotic dependence (HCC)    Nonspecific mesenteric lymphadenitis    Overweight(278.02)    Pain in limb    Pain in right foot    PTSD (post-traumatic stress disorder)    Pulmonary nodule    Restless legs syndrome  Sleep apnea    on CPAP machine   Stroke (HCC) 2016   Systemic lupus erythematosus (SLE) in adult [facility])    Tobacco abuse    one pack per day; 35 pack years; quit in February 2023   Type 2 diabetes mellitus with hyperglycemia (HCC)     Past Surgical History:  Procedure Laterality Date   ABDOMINAL HYSTERECTOMY     TAH&BSO   BACK SURGERY     BLADDER SUSPENSION  09/12/2011   Procedure: TRANSVAGINAL TAPE (TVT) PROCEDURE;  Surgeon: Reggie Caper, MD;  Location: [facility];  Service: Urology;  Laterality: N/A;   CESAREAN SECTION     X3   CHOLECYSTECTOMY     CORONARY PRESSURE/FFR STUDY N/A  04/21/2022   Procedure: INTRAVASCULAR PRESSURE WIRE/FFR STUDY;  Surgeon: Arleen Lacer, MD;  Location: [facility] INVASIVE CV LAB;  Service: Cardiovascular;  Laterality: N/A;   ESOPHAGOGASTRODUODENOSCOPY ENDOSCOPY     throat stretched per pt.   LEFT HEART CATH AND CORONARY ANGIOGRAPHY N/A 04/21/2022   Procedure: LEFT HEART CATH AND CORONARY ANGIOGRAPHY;  Surgeon: Arleen Lacer, MD;  Location: [facility] INVASIVE CV LAB;  Service: Cardiovascular;  Laterality: N/A;   LIVER BIOPSY     x4   LUMBAR EPIDURAL INJECTION  01/2012   TOTAL ABDOMINAL HYSTERECTOMY W/ BILATERAL SALPINGOOPHORECTOMY     TUBAL LIGATION     Bilateral   UPPER GASTROINTESTINAL ENDOSCOPY  09/13/2010    Family Psychiatric History: See below  Family History:  Family History  Problem Relation Age of Onset   Diabetes Mother    Hypertension Mother    Anxiety disorder Mother    Depression Mother    Drug abuse Mother    Heart disease Father    Diabetes Father    Anxiety disorder Father    Alcohol abuse Father    Depression Father    OCD Father    Thyroid  disease Sister    Healthy Brother    Healthy Daughter    Healthy Son    ADD / ADHD Son    Alcohol abuse Paternal Grandfather    Depression Paternal Grandfather    Cancer Paternal Grandfather        lung,skin   Tuberculosis Paternal Grandfather    Seizures Paternal Grandmother    Lupus Paternal Grandmother    ADD / ADHD Son    Anesthesia problems Neg Hx    Malignant hyperthermia Neg Hx    Pseudochol deficiency Neg Hx    Hypotension Neg Hx    Bipolar disorder Neg Hx    Dementia Neg Hx    Paranoid behavior Neg Hx    Schizophrenia Neg Hx    Sexual abuse Neg Hx    Physical abuse Neg Hx     Social History:  Social History   Socioeconomic History   Marital status: Divorced    Spouse name: Not on file   Number of children: 3   Years of education: GED   Highest education level: Not on file  Occupational History    Employer: NOT EMPLOYED  Tobacco Use   Smoking  status: Former    Current packs/day: 0.00    Average packs/day: 1.5 packs/day for 25.0 years (37.5 ttl pk-yrs)    Types: Cigarettes    Start date: 04/25/1988    Quit date: 04/25/2013    Years since quitting: 10.6    Passive exposure: Never   Smokeless tobacco: Never  Vaping Use   Vaping status: Every Day   Start date: 04/22/2013  Substances: Nicotine , Flavoring  Substance and Sexual Activity   Alcohol use: No   Drug use: No   Sexual activity: Yes    Birth control/protection: Surgical, Abstinence  Other Topics Concern   Not on file  Social History Narrative   Divorced since 62.Lives with boyfriend of 11 years.On disability.   Social Drivers of Corporate investment banker Strain: Not on file  Food Insecurity: Not on file  Transportation Needs: Not on file  Physical Activity: Not on file  Stress: Not on file  Social Connections: Not on file    Allergies:  Allergies  Allergen Reactions   Doxycycline Shortness Of Breath and Rash    Heart racing   Fentanyl Shortness Of Breath, Itching and Other (See Comments)    Heart racing, SOB   Cefoxitin     Unknown reaction   Ceftin [Cefuroxime Axetil]     Unknown reaction   Iodinated Contrast Media Other (See Comments)    Knots on body   Iohexol  Other (See Comments)    Knots on body    Norvasc [Amlodipine Besylate]     Unknown reaction   Trintellix  [Vortioxetine ]     Tremors, heart race   Wellbutrin [Bupropion Hcl] Nausea And Vomiting   Latex Rash   Tape Rash    Metabolic Disorder Labs: Lab Results  Component Value Date   HGBA1C 11.4 (H) 08/03/2020   MPG 280 08/03/2020   MPG 237.43 08/11/2018   No results found for: PROLACTIN Lab Results  Component Value Date   CHOL 153 04/25/2019   TRIG 141 04/25/2019   HDL 37 04/25/2019   CHOLHDL 4.9 03/06/2015   VLDL 16 03/06/2015   LDLCALC 91 04/25/2019   LDLCALC 104 (H) 03/06/2015   Lab Results  Component Value Date   TSH 4.616 (H) 05/23/2022   TSH 2.79  08/03/2020    Therapeutic Level Labs: No results found for: LITHIUM No results found for: VALPROATE No results found for: CBMZ  Current Medications: Current Outpatient Medications  Medication Sig Dispense Refill   ACCU-CHEK GUIDE test strip USE TO CHECK BLOOD SUGAR FOUR TIMES DAILY     Accu-Chek Softclix Lancets lancets CHECK SUGAR ONCE DAILY     amphetamine -dextroamphetamine  (ADDERALL) 20 MG tablet Take 1 tablet (20 mg total) by mouth 2 (two) times daily. 60 tablet 0   amphetamine -dextroamphetamine  (ADDERALL) 20 MG tablet Take 1 tablet (20 mg total) by mouth 2 (two) times daily. 60 tablet 0   amphetamine -dextroamphetamine  (ADDERALL) 20 MG tablet Take 1 tablet (20 mg total) by mouth 2 (two) times daily. 60 tablet 0   ARIPiprazole  (ABILIFY ) 2 MG tablet Take 1 tablet (2 mg total) by mouth at bedtime. 30 tablet 2   azaTHIOprine  (IMURAN ) 50 MG tablet Take 1 tablet (50 mg total) by mouth daily. 90 tablet 1   bimatoprost (LUMIGAN) 0.01 % SOLN Place 1 drop into both eyes at bedtime.     Calcium  Citrate-Vitamin D3 (CALCIUM  CITRATE +D) 315-6.25 MG-MCG TABS Take 1 tablet by mouth 2 (two) times daily.     cholecalciferol (VITAMIN D3) 25 MCG (1000 UNIT) tablet Take 2,000 Units by mouth daily.     cycloSPORINE (RESTASIS) 0.05 % ophthalmic emulsion Place 1 drop into both eyes 2 (two) times daily.     dicyclomine  (BENTYL ) 10 MG capsule Take 1 capsule (10 mg total) by mouth 2 (two) times daily as needed. 60 capsule 2   esomeprazole  (NEXIUM ) 40 MG capsule Take 1 capsule (40 mg total) by mouth daily  at 12 noon. 90 capsule 3   famotidine  (PEPCID ) 20 MG tablet Take 1 tablet (20 mg total) by mouth at bedtime. 30 tablet 3   FARXIGA 10 MG TABS tablet Take 10 mg by mouth daily.     fexofenadine (ALLEGRA) 180 MG tablet Take 180 mg by mouth daily.     gabapentin  (NEURONTIN ) 100 MG capsule Take 100 mg by mouth 3 (three) times daily.     HYDROcodone-acetaminophen  (NORCO/VICODIN) 5-325 MG tablet Take 1 tablet  by mouth 2 (two) times daily as needed for moderate pain. 0.5 -1 po bid prn     hydroxychloroquine  (PLAQUENIL ) 200 MG tablet Take 1 tablet (200 mg total) by mouth 2 (two) times daily. TAKE 1 TABLET(200 MG) BY MOUTH TWICE DAILY 180 tablet 0   isosorbide  mononitrate (IMDUR ) 30 MG 24 hr tablet Take 30 mg by mouth every morning.     lisinopril  (ZESTRIL ) 40 MG tablet Take 40 mg by mouth daily.     metFORMIN  (GLUCOPHAGE ) 1000 MG tablet Take 1 tablet (1,000 mg total) by mouth 2 (two) times daily with a meal. 60 tablet 3   methocarbamol  (ROBAXIN ) 500 MG tablet Take 500 mg by mouth 2 (two) times daily as needed for muscle spasms.     metoCLOPramide  (REGLAN ) 5 MG tablet Take 1 tablet (5 mg total) by mouth 3 (three) times daily before meals. 210 tablet 3   Multiple Vitamins-Minerals (CENTRUM) tablet Take 1 tablet by mouth daily.     nitroGLYCERIN  (NITROLINGUAL ) 0.4 MG/SPRAY spray Place 1 spray under the tongue every 5 (five) minutes x 3 doses as needed for chest pain.     NP THYROID  60 MG tablet TAKE 1 TABLET(60 MG) BY MOUTH DAILY BEFORE BREAKFAST (Patient taking differently: Take 60 mg by mouth daily before breakfast.) 30 tablet 3   OZEMPIC, 1 MG/DOSE, 4 MG/3ML SOPN Inject 1 mg into the skin once a week.     PARoxetine  (PAXIL ) 40 MG tablet Take 1 tablet (40 mg total) by mouth at bedtime. 90 tablet 2   Polyvinyl Alcohol-Povidone (REFRESH OP) Place 1 drop into both eyes daily as needed (dry eyes).     prazosin  (MINIPRESS ) 2 MG capsule TAKE 1 CAPSULE(2 MG) BY MOUTH AT BEDTIME 30 capsule 2   PRESCRIPTION MEDICATION Take 10 mg by mouth 3 (three) times daily before meals. This is MOTILLIUM ( DOMPERIDONE 10 mg) Tablet     PREVIDENT 5000 PLUS 1.1 % CREA dental cream Place 1 Application onto teeth every morning.     Probiotic Product (PROBIOTIC PO) Take 1 capsule by mouth daily.     propafenone (RYTHMOL SR) 225 MG 12 hr capsule Take 225 mg by mouth every 12 (twelve) hours.     REPATHA 140 MG/ML SOSY Inject 1 mL into  the skin every 14 (fourteen) days.     rosuvastatin (CRESTOR) 40 MG tablet Take 40 mg by mouth daily.     spironolactone (ALDACTONE) 25 MG tablet Take 25 mg by mouth daily.     No current facility-administered medications for this visit.     Musculoskeletal: Strength & Muscle Tone: within normal limits Gait & Station: normal Patient leans: N/A  Psychiatric Specialty Exam: Review of Systems  Musculoskeletal:  Positive for arthralgias and back pain.  All other systems reviewed and are negative.   There were no vitals taken for this visit.There is no height or weight on file to calculate BMI.  General Appearance: Casual and Fairly Groomed  Eye Contact:  Good  Speech:  Clear and Coherent  Volume:  Normal  Mood:  Euthymic  Affect:  Congruent  Thought Process:  Goal Directed  Orientation:  Full (Time, Place, and Person)  Thought Content: WDL   Suicidal Thoughts:  No  Homicidal Thoughts:  No  Memory:  Immediate;   Good Recent;   Good Remote;   NA  Judgement:  Good  Insight:  Good  Psychomotor Activity:  Decreased  Concentration:  Concentration: Good and Attention Span: Good  Recall:  Good  Fund of Knowledge: Good  Language: Good  Akathisia:  No  Handed:  Right  AIMS (if indicated): not done  Assets:  Communication Skills Desire for Improvement Resilience Social Support Talents/Skills  ADL's:  Intact  Cognition: WNL  Sleep:  Good   Screenings: GAD-7    Advertising copywriter from 11/26/2023 in [facility] [place] Office Visit from 05/22/2023 in [facility] [place] Office Visit from 04/23/2023 in [facility] [place] Counselor from 09/04/2022 in [facility] [place]  Total GAD-7 Score 14 7 16 19    PHQ2-9    Flowsheet Row Counselor from 11/26/2023 in [facility] [place] Office Visit from 05/22/2023  in [facility] [place] Office Visit from 04/23/2023 in [facility] [place] Counselor from 09/04/2022 in [facility] [place] Video Visit from 02/08/2022 in [facility] Total Score 5 2 6 6 2   PHQ-9 Total Score 16 6 19 17 7    Flowsheet Row Counselor from 11/26/2023 in [facility] [place] Office Visit from 04/23/2023 in [facility] [place] ED from 10/27/2022 in [facility] Emergency Department at [facility]  C-SSRS RISK CATEGORY Error: Question 6 not populated No Risk No Risk     Assessment and Plan: This patient is a 53 year old female with a history of PTSD nightmares depression anxiety and ADHD.  She seems to be doing well on her current regimen.  She will continue Paxil  40 mg daily for depression, Abilify  2 mg daily for augmentation, prazosin  2 mg at bedtime for nightmares and Adderall 20 mg twice daily for ADHD.  She will return to see me in 3 months  Collaboration of Care: Collaboration of Care: Referral or follow-up with counselor/therapist AEB patient will continue therapy with Secundino Dach in our office  Patient/Guardian was advised Release of Information must be obtained prior to any record release in order to collaborate their care with an outside provider. Patient/Guardian was advised if they have not already done so to contact the registration department to sign all necessary forms in order for us  to release information regarding their care.   Consent: Patient/Guardian gives verbal consent for treatment and assignment of benefits for services provided during this visit. Patient/Guardian expressed understanding and agreed to proceed.    Alfredia Annas, MD 12/27/2023, 2:29 PM

## 2023-12-28 ENCOUNTER — Encounter (HOSPITAL_COMMUNITY)

## 2024-01-01 ENCOUNTER — Ambulatory Visit (HOSPITAL_COMMUNITY): Admitting: Physical Therapy

## 2024-01-01 DIAGNOSIS — G8929 Other chronic pain: Secondary | ICD-10-CM

## 2024-01-01 DIAGNOSIS — M25562 Pain in left knee: Secondary | ICD-10-CM | POA: Diagnosis not present

## 2024-01-01 NOTE — Therapy (Signed)
 OUTPATIENT PHYSICAL THERAPY LOWER EXTREMITY TREATMENT   Patient Name: Lindsay Walsh MRN: [id number] DOB:03/06/1971, 53 y.o., female Today's Date: 01/01/2024  END OF SESSION:  PT End of Session - 01/01/24 1602     Visit Number 5    Number of Visits 12    Date for PT Re-Evaluation 01/18/24    Authorization Type [employer]    Authorization Time Period No auth required    Progress Note Due on Visit 10    PT Start Time 1521    PT Stop Time 1601    PT Time Calculation (min) 40 min    Activity Tolerance Patient tolerated treatment well    Behavior During Therapy WFL for tasks assessed/performed            Past Medical History:  Diagnosis Date   Acid reflux    Allergic rhinitis due to pollen    Anxiety    Arthritis    back   Autoimmune hepatitis (HCC)    Back pain    Connective tissue disease (HCC)    Depression    Dizziness and giddiness    Dyspareunia 05/06/2014   Dysrhythmia    palpatations; on propafenone   Essential (primary) hypertension    Fibromyalgia    Gastroesophageal reflux disease    Chronic abdominal pain; gastroparesis; globus hystericus; irritable bowel syndrome   Glaucoma    Hereditary and idiopathic neuropathy, unspecified    Hyperlipidemia    Irritable bowel syndrome    Major depressive disorder, single episode, unspecified    Morbid (severe) obesity due to excess calories (HCC)    Narcotic dependence (HCC)    Nonspecific mesenteric lymphadenitis    Overweight(278.02)    Pain in limb    Pain in right foot    PTSD (post-traumatic stress disorder)    Pulmonary nodule    Restless legs syndrome    Sleep apnea    on CPAP machine   Stroke (HCC) 2016   Systemic lupus erythematosus (SLE) in adult [facility])    Tobacco abuse    one pack per day; 35 pack years; quit in February 2023   Type 2 diabetes mellitus with hyperglycemia (HCC)    Past Surgical History:  Procedure Laterality Date   ABDOMINAL HYSTERECTOMY     TAH&BSO   BACK  SURGERY     BLADDER SUSPENSION  09/12/2011   Procedure: TRANSVAGINAL TAPE (TVT) PROCEDURE;  Surgeon: Emery LILLETTE Blaze, MD;  Location: [facility];  Service: Urology;  Laterality: N/A;   CESAREAN SECTION     X3   CHOLECYSTECTOMY     CORONARY PRESSURE/FFR STUDY N/A 04/21/2022   Procedure: INTRAVASCULAR PRESSURE WIRE/FFR STUDY;  Surgeon: Anner Alm ORN, MD;  Location: [facility] INVASIVE CV LAB;  Service: Cardiovascular;  Laterality: N/A;   ESOPHAGOGASTRODUODENOSCOPY ENDOSCOPY     throat stretched per pt.   LEFT HEART CATH AND CORONARY ANGIOGRAPHY N/A 04/21/2022   Procedure: LEFT HEART CATH AND CORONARY ANGIOGRAPHY;  Surgeon: Anner Alm ORN, MD;  Location: [facility] INVASIVE CV LAB;  Service: Cardiovascular;  Laterality: N/A;   LIVER BIOPSY     x4   LUMBAR EPIDURAL INJECTION  01/2012   TOTAL ABDOMINAL HYSTERECTOMY W/ BILATERAL SALPINGOOPHORECTOMY     TUBAL LIGATION     Bilateral   UPPER GASTROINTESTINAL ENDOSCOPY  09/13/2010   Patient Active Problem List   Diagnosis Date Noted   Abdominal bloating 10/10/2023   Pain in right knee 03/28/2023   Diabetes mellitus without complication (HCC) 03/27/2023   Liver  fibrosis 03/02/2023   Pain in left shoulder 09/25/2022   Progressive angina (HCC)    Coronary artery calcification seen on CT scan    History of nuclear stress test    Abnormal nuclear stress test 04/11/2022   Atypical angina (HCC) 04/11/2022   Bilateral carpal tunnel syndrome 04/05/2022   Positive ANA (antinuclear antibody) 11/16/2021   History of colonic polyps 11/15/2021   Bilateral hand pain 06/02/2020   OAB (overactive bladder) 11/05/2019   Recurrent UTI 11/05/2019   Diabetic gastroparesis (HCC) 08/21/2019   Diarrhea 07/15/2019   Abnormal finding on urinalysis 07/15/2019   Morbid (severe) obesity due to excess calories (HCC)    Major depressive disorder, single episode, unspecified    Restless legs syndrome    Sleep apnea    Fibromyalgia    Hereditary and idiopathic neuropathy,  unspecified    Nonspecific mesenteric lymphadenitis    Pain in right foot    Type 2 diabetes mellitus with hyperglycemia (HCC)    Hypothyroidism 08/22/2018   DM type 2 causing vascular disease (HCC) 08/21/2018   Hyperlipidemia associated with type 2 diabetes mellitus (HCC) 08/21/2018   Essential hypertension, benign 08/21/2018   Influenza A 08/10/2018   SIRS (systemic inflammatory response syndrome) (HCC) 08/10/2018   Cerebral infarction (HCC) 03/05/2015   Dyspareunia 05/06/2014   Obesity 09/30/2013   Bilateral hip pain 07/16/2013   Chronic pain syndrome 07/16/2013   Degenerative disc disease, lumbar 07/16/2013   Greater trochanteric bursitis of both hips 07/16/2013   Nightmares associated with chronic post-traumatic stress disorder 10/18/2012   Sprain of foot 03/14/2012   Weakness 01/29/2012   Irritable bowel syndrome with diarrhea 08/01/2011   Gastroesophageal reflux disease    Depression    Chest pain    Fasting hyperglycemia    TOBACCO ABUSE 04/28/2010   NARCOTIC ABUSE 04/28/2010   PULMONARY NODULE 04/28/2010   Autoimmune hepatitis (HCC) 04/28/2010   Myalgia and myositis 01/06/2009    PCP: Kahoano, Haku K MD  REFERRING PROVIDER: Jeannetta Lonni ORN, MD  REFERRING DIAG:  [phone] (ICD-10-CM) - Chronic pain of right knee  M25.562,G89.29 (ICD-10-CM) - Chronic pain of left knee    THERAPY DIAG:  Chronic pain of left knee  Chronic pain of right knee  Rationale for Evaluation and Treatment: Rehabilitation  ONSET DATE: many years ago with increased lately  SUBJECTIVE:   SUBJECTIVE STATEMENT: 01/01/24:  pt reports still most of her pain is located in her upper back.  No specific pain level given today.    Eval:  Both knees hurt about the same and it will just hit. The MD was also talking about her neck/back that is bothering her but she's here for referral for knees today. Has difficulty with walking, stairs and sometimes they give out on her. Would love to be  able to get around and have less pain and manage her knee pain with exercises and strengthening them.   PERTINENT HISTORY: Heart; liver disease; PTSD/MDD; diabetic gastroparesis; hyperlipidemia; hx of lupus PAIN:  Are you having pain? Yes: NPRS scale: 2 current - 9 when going up/down steps - 0 when sitting and depends on the weather Pain location: around the kneecaps Pain description: achy when sitting; going down stairs is sharp/stabbing Aggravating factors: walking, stairs Relieving factors: sitting sometimes, biofreeze, resting, knee brace  PRECAUTIONS: Fall  RED FLAGS: None   WEIGHT BEARING RESTRICTIONS: No  FALLS:  Has patient fallen in last 6 months? Yes. Number of falls 1 about 6 months ago when her knees  just gave out on her with a pain and she fell down the steps  LIVING ENVIRONMENT: Lives with: lives with their partner Lives in: House/apartment Stairs: Yes: External: 6 steps; can reach both Has following equipment at home: Single point cane and compression sleeve knee brace (both)  OCCUPATION: disability  PLOF: Independent with basic ADLs, Needs assistance with homemaking, and boyfriend helps with lifting/carrying  PATIENT GOALS: Learn how to cope with pain in the knees as well as get them stronger, especially when she's in pain.   NEXT MD VISIT: TBD  OBJECTIVE:  Note: Objective measures were completed at Evaluation unless otherwise noted.  DIAGNOSTIC FINDINGS: XR L knee -Mild medial and patellofemoral compartment osteoarthritis, enthesophyte may also indicate chronic or quadriceps tendonitis  XR R knee - Mild appearing osteoarthritis, large patellar enthesophyte suggestive for  chronic quadriceps tendinitis or enthesitis   PATIENT SURVEYS:  LEFS  Survey date:    Score total:  27/80 - 33.8%    COGNITION: Overall cognitive status: Within functional limits for tasks assessed     SENSATION: Tingling in the bottom of her feet mostly but going up to leg  sometimes; also UE N/T - neuropathy   MUSCLE LENGTH: Hamstrings: bilateral tightness noted with increased tightness R > L  POSTURE: rounded shoulders, forward head, and increased thoracic kyphosis  PALPATION:    LOWER EXTREMITY ROM:  Active ROM Right eval Left eval  Hip flexion    Hip extension    Hip abduction    Hip adduction    Hip internal rotation    Hip external rotation    Knee flexion    Knee extension    Ankle dorsiflexion    Ankle plantarflexion    Ankle inversion    Ankle eversion     (Blank rows = not tested)  LOWER EXTREMITY MMT:  MMT Right eval Left eval  Hip flexion 4- 4-  Hip extension    Hip abduction 4- 4-  Hip adduction 4- 4-  Hip internal rotation    Hip external rotation    Knee flexion 4- 4-  Knee extension 4 4  Ankle dorsiflexion 4 4-  Ankle plantarflexion    Ankle inversion    Ankle eversion     (Blank rows = not tested)  FUNCTIONAL TESTS:  5 times sit to stand: 17.3 s  30 seconds chair stand test 9 repetitions (pain went from 2/10 to a 4/10 mostly in the L knee)  Normative values for the 5TSTS according to research:  50-59 years: 7.7  2.6 seconds.   GAIT: Distance walked: 78' w/ no increased in pain (maintained 2/10 pain)  Comments: Increased hip adduction and excessive pronation bilaterally; genu valgum bilaterally; trendelenburg; decreased trunk rotation/arm swing                                                                                                                               TREATMENT DATE:  12/26/23: Bike full  revolution x 5' L3 resistance Standing  Vectors with 1 UE assist 10X5 each LE   Forward Step up 6in 1 UE assistance 10x2 each LE Lateral step up 6in step height with 1 UE assist eccentric lowering  Forward step down 4in with 1 HHA 2X 10 each  Forward lunges onto 4 step no UE assist 2X10 each Green theraband rows 2X20 Green theraband extensions 2X20  -  12/20/2023  -recumbent bike x 4' (seat  3) -Standing hip abduction isometric with HHA x 12 with 3'' isometric -Standing eccentric step down with cues for hip hinge on 4in box BUE on // bars 2x15 -Standing TKE with RTB 2x15 with BUE support -2in eccentric stepdown x 10 with single UE assist - Standing ankle DF against wall x 25 -Bosu ball lunges with hovering hands x 10 bilaterally 3-5'' hold  12/18/23: Reviewed goals Educated importance of HEP compliance for maximal benefits  Supine:  Bridge 10x  SLR 10x Sidelying:  Clam with RTB  Abduction Seated:  STS no HHA eccentric control  Hamstring stretch 2x 30 reports some relief in her back following stretch Standing:   Abduction with HHA  Marching alternating with ab set with intermittent HHA 10x 3-5  Sidestep RTB around thigh   12/07/23 - Eval and prescription of HEP for initiation of strengthening/stretching with pain management    PATIENT EDUCATION:  Education details: HEP, plan of care Person educated: Patient Education method: Explanation, Demonstration, and Tactile cues Education comprehension: verbalized understanding and returned demonstration  HOME EXERCISE PROGRAM: Access Code: [email or web address] URL: [email or web address] Date: 12/07/2023 Prepared by: Lamarr Citrin  Exercises - Beginner Bridge  - 1 x daily - 7 x weekly - 3 sets - 10 reps - 3s hold - Straight Leg Raise  - 1 x daily - 7 x weekly - 2 sets - 10 reps - 5s hold - Seated Hamstring Stretch  - 1 x daily - 7 x weekly - 2 sets - 6 reps - 15s  hold  - Clam with Resistance  - 2 x daily - 7 x weekly - 1 sets - 10 reps - 3-5 hold - Beginner Side Leg Lift  - 1 x daily - 7 x weekly - 3 sets - 10 reps - 3-5 hold - Sit to Stand Without Arm Support  - 2 x daily - 7 x weekly - 1 sets - 10 reps  - Heel Toe Raises with Counter Support  - 1 x daily - 7 x weekly - 1-2 sets - 25 reps - Standing Terminal Knee Extension with Resistance  - 1 x daily - 7 x weekly - 3 sets - 15 reps  12/26/23: - Single  Leg Stance  - 2 x daily - 7 x weekly - 1 sets - 3 reps - 30 hold - Standing Tandem Balance with Counter Support  - 2 x daily - 7 x weekly - 1 sets - 3 reps - 30 hold  ASSESSMENT:  CLINICAL IMPRESSION:  01/01/24: continued focus with functional strengthening for bil LE, core and posture.  Began vectors with minimal cues needed for hold time and form.  Continued with squats with encouragement to complete with deeper ROM. Increased to 6 for forward and lateral step ups.  Pt unable to complete forward step downs with 6 as too challenging but good control and no pain with 4 inch.  Added postural strengthening with theraband today.  Cues needed to keep head up and stabilize core .  Lunges also added to  challenge stability today. Pt able to complete all exercises today without c/o Cues for correct form to challenge eccentric element when lowering.  Also decreased UE assist where able.  No reports of pain or issues while completing session today and also no rest breakd taken.      Eval:  Patient is a 53 y.o. female who was seen today for physical therapy evaluation and treatment for bilateral chronic knee pain exacerbated with walking long periods, stair navigation, and standing prolonged periods. Presents with weakness in hip musculature, impaired joint integrity, decreased functional endurance, and poor biomechanics with squatting/sit to stand and with gait with good prognosis for improving pain tolerance to activity with routine skilled PT services and maintenance of compliance with HEP.   OBJECTIVE IMPAIRMENTS: Abnormal gait, decreased activity tolerance, decreased endurance, difficulty walking, decreased strength, impaired flexibility, and improper body mechanics.   ACTIVITY LIMITATIONS: carrying, standing, squatting, stairs, transfers, and locomotion level  PARTICIPATION LIMITATIONS: shopping and community activity  PERSONAL FACTORS: 3+ comorbidities: liver disease, cardiac disease, and hx of lupus  are also affecting patient's functional outcome.   REHAB POTENTIAL: Good  CLINICAL DECISION MAKING: Evolving/moderate complexity  EVALUATION COMPLEXITY: Moderate   GOALS: Goals reviewed with patient? No  SHORT TERM GOALS: Target date: 12/20/23 Pt will be independent with compliance with HEP at least 5/7 days of the week.  Baseline: prescribed Goal status: INITIAL  2.  Pt will be able to demonstrate straight leg raise 30 repetitions through full height without increased pain in back/LE to indicate compliance with HEP and improved strengthening. Baseline: decreased ROM and increased posterior hip pain on L > R Goal status: INITIAL  LONG TERM GOALS: Target date: 01/18/24  Pt will be able to progress and be independent with HEP for B knee strengthening and stretching as appropriate.  Baseline: prescribed Goal status: INITIAL  2.  Pt will report improved functional ADL tolerance as indicated by improved LEFS score by at least 15 points.  Baseline: 27/80 Goal status: INITIAL  3.  Pt will improve her gait distance during by at least 75' with no increased pain in order to indicate improved functional BLE endurance and appropriate gait mechanics needed for functional mobility.  Baseline:  Goal status: INITIAL  4.  Pt 30s sit to stand will be improved by 4 repetitions and no increase in pain in order to indicate improved functional BLE strength.  Baseline: 9 repetitions  Goal status: INITIAL  PLAN:  PT FREQUENCY: 2x/week  PT DURATION: 6 weeks  PLANNED INTERVENTIONS: 97110-Therapeutic exercises, 97530- Therapeutic activity, [id number]- Neuromuscular re-education, 97535- Self Care, [id number]- Manual therapy, [phone]- Gait training, [phone]- Orthotic Initial, [phone]- Electrical stimulation (unattended), [phone]- Electrical stimulation (manual), Patient/Family education, Balance training, Stair training, Taping, Dry Needling, Joint mobilization, and Joint manipulation  PLAN FOR NEXT SESSION:   hip/knee strengthening; core/proximal HEP engagement.    Greig KATHEE Fuse, PTA/CLT [facility] Ph: [phone]  Fuse Greig KATHEE, PTA 01/01/2024, 4:05 PM

## 2024-01-03 ENCOUNTER — Ambulatory Visit (HOSPITAL_COMMUNITY): Admitting: Physical Therapy

## 2024-01-03 DIAGNOSIS — G8929 Other chronic pain: Secondary | ICD-10-CM

## 2024-01-03 DIAGNOSIS — M25562 Pain in left knee: Secondary | ICD-10-CM | POA: Diagnosis not present

## 2024-01-03 NOTE — Therapy (Addendum)
 OUTPATIENT PHYSICAL THERAPY LOWER EXTREMITY TREATMENT   Patient Name: Lindsay Walsh MRN: [id number] DOB:1971/07/09, 53 y.o., female Today's Date: 01/03/2024  END OF SESSION:  PT End of Session - 01/03/24 1545     Visit Number 6    Number of Visits 12    Date for PT Re-Evaluation 01/18/24    Authorization Type [employer]    Authorization Time Period No auth required    Progress Note Due on Visit 10    PT Start Time 1520    PT Stop Time 1600    PT Time Calculation (min) 40 min    Activity Tolerance Patient tolerated treatment well    Behavior During Therapy WFL for tasks assessed/performed             Past Medical History:  Diagnosis Date   Acid reflux    Allergic rhinitis due to pollen    Anxiety    Arthritis    back   Autoimmune hepatitis (HCC)    Back pain    Connective tissue disease (HCC)    Depression    Dizziness and giddiness    Dyspareunia 05/06/2014   Dysrhythmia    palpatations; on propafenone   Essential (primary) hypertension    Fibromyalgia    Gastroesophageal reflux disease    Chronic abdominal pain; gastroparesis; globus hystericus; irritable bowel syndrome   Glaucoma    Hereditary and idiopathic neuropathy, unspecified    Hyperlipidemia    Irritable bowel syndrome    Major depressive disorder, single episode, unspecified    Morbid (severe) obesity due to excess calories (HCC)    Narcotic dependence (HCC)    Nonspecific mesenteric lymphadenitis    Overweight(278.02)    Pain in limb    Pain in right foot    PTSD (post-traumatic stress disorder)    Pulmonary nodule    Restless legs syndrome    Sleep apnea    on CPAP machine   Stroke (HCC) 2016   Systemic lupus erythematosus (SLE) in adult [facility])    Tobacco abuse    one pack per day; 35 pack years; quit in February 2023   Type 2 diabetes mellitus with hyperglycemia (HCC)    Past Surgical History:  Procedure Laterality Date   ABDOMINAL HYSTERECTOMY     TAH&BSO    BACK SURGERY     BLADDER SUSPENSION  09/12/2011   Procedure: TRANSVAGINAL TAPE (TVT) PROCEDURE;  Surgeon: Emery LILLETTE Blaze, MD;  Location: [facility];  Service: Urology;  Laterality: N/A;   CESAREAN SECTION     X3   CHOLECYSTECTOMY     CORONARY PRESSURE/FFR STUDY N/A 04/21/2022   Procedure: INTRAVASCULAR PRESSURE WIRE/FFR STUDY;  Surgeon: Anner Alm ORN, MD;  Location: [facility] INVASIVE CV LAB;  Service: Cardiovascular;  Laterality: N/A;   ESOPHAGOGASTRODUODENOSCOPY ENDOSCOPY     throat stretched per pt.   LEFT HEART CATH AND CORONARY ANGIOGRAPHY N/A 04/21/2022   Procedure: LEFT HEART CATH AND CORONARY ANGIOGRAPHY;  Surgeon: Anner Alm ORN, MD;  Location: [facility] INVASIVE CV LAB;  Service: Cardiovascular;  Laterality: N/A;   LIVER BIOPSY     x4   LUMBAR EPIDURAL INJECTION  01/2012   TOTAL ABDOMINAL HYSTERECTOMY W/ BILATERAL SALPINGOOPHORECTOMY     TUBAL LIGATION     Bilateral   UPPER GASTROINTESTINAL ENDOSCOPY  09/13/2010   Patient Active Problem List   Diagnosis Date Noted   Abdominal bloating 10/10/2023   Pain in right knee 03/28/2023   Diabetes mellitus without complication (HCC) 03/27/2023  Liver fibrosis 03/02/2023   Pain in left shoulder 09/25/2022   Progressive angina (HCC)    Coronary artery calcification seen on CT scan    History of nuclear stress test    Abnormal nuclear stress test 04/11/2022   Atypical angina (HCC) 04/11/2022   Bilateral carpal tunnel syndrome 04/05/2022   Positive ANA (antinuclear antibody) 11/16/2021   History of colonic polyps 11/15/2021   Bilateral hand pain 06/02/2020   OAB (overactive bladder) 11/05/2019   Recurrent UTI 11/05/2019   Diabetic gastroparesis (HCC) 08/21/2019   Diarrhea 07/15/2019   Abnormal finding on urinalysis 07/15/2019   Morbid (severe) obesity due to excess calories (HCC)    Major depressive disorder, single episode, unspecified    Restless legs syndrome    Sleep apnea    Fibromyalgia    Hereditary and idiopathic neuropathy,  unspecified    Nonspecific mesenteric lymphadenitis    Pain in right foot    Type 2 diabetes mellitus with hyperglycemia (HCC)    Hypothyroidism 08/22/2018   DM type 2 causing vascular disease (HCC) 08/21/2018   Hyperlipidemia associated with type 2 diabetes mellitus (HCC) 08/21/2018   Essential hypertension, benign 08/21/2018   Influenza A 08/10/2018   SIRS (systemic inflammatory response syndrome) (HCC) 08/10/2018   Cerebral infarction (HCC) 03/05/2015   Dyspareunia 05/06/2014   Obesity 09/30/2013   Bilateral hip pain 07/16/2013   Chronic pain syndrome 07/16/2013   Degenerative disc disease, lumbar 07/16/2013   Greater trochanteric bursitis of both hips 07/16/2013   Nightmares associated with chronic post-traumatic stress disorder 10/18/2012   Sprain of foot 03/14/2012   Weakness 01/29/2012   Irritable bowel syndrome with diarrhea 08/01/2011   Gastroesophageal reflux disease    Depression    Chest pain    Fasting hyperglycemia    TOBACCO ABUSE 04/28/2010   NARCOTIC ABUSE 04/28/2010   PULMONARY NODULE 04/28/2010   Autoimmune hepatitis (HCC) 04/28/2010   Myalgia and myositis 01/06/2009    PCP: Kahoano, Haku K MD  REFERRING PROVIDER: Jeannetta Lonni ORN, MD  REFERRING DIAG:  [phone] (ICD-10-CM) - Chronic pain of right knee  M25.562,G89.29 (ICD-10-CM) - Chronic pain of left knee    THERAPY DIAG:  Chronic pain of left knee  Chronic pain of right knee  Rationale for Evaluation and Treatment: Rehabilitation  ONSET DATE: many years ago with increased lately  SUBJECTIVE:   SUBJECTIVE STATEMENT: Pt reports no pain today.    Eval:  Both knees hurt about the same and it will just hit. The MD was also talking about her neck/back that is bothering her but she's here for referral for knees today. Has difficulty with walking, stairs and sometimes they give out on her. Would love to be able to get around and have less pain and manage her knee pain with exercises and  strengthening them.   PERTINENT HISTORY: Heart; liver disease; PTSD/MDD; diabetic gastroparesis; hyperlipidemia; hx of lupus PAIN:  Are you having pain? Yes: NPRS scale: 2 current - 9 when going up/down steps - 0 when sitting and depends on the weather Pain location: around the kneecaps Pain description: achy when sitting; going down stairs is sharp/stabbing Aggravating factors: walking, stairs Relieving factors: sitting sometimes, biofreeze, resting, knee brace  PRECAUTIONS: Fall  RED FLAGS: None   WEIGHT BEARING RESTRICTIONS: No  FALLS:  Has patient fallen in last 6 months? Yes. Number of falls 1 about 6 months ago when her knees just gave out on her with a pain and she fell down the steps  LIVING  ENVIRONMENT: Lives with: lives with their partner Lives in: House/apartment Stairs: Yes: External: 6 steps; can reach both Has following equipment at home: Single point cane and compression sleeve knee brace (both)  OCCUPATION: disability  PLOF: Independent with basic ADLs, Needs assistance with homemaking, and boyfriend helps with lifting/carrying  PATIENT GOALS: Learn how to cope with pain in the knees as well as get them stronger, especially when she's in pain.   NEXT MD VISIT: TBD  OBJECTIVE:  Note: Objective measures were completed at Evaluation unless otherwise noted.  DIAGNOSTIC FINDINGS: XR L knee -Mild medial and patellofemoral compartment osteoarthritis, enthesophyte may also indicate chronic or quadriceps tendonitis  XR R knee - Mild appearing osteoarthritis, large patellar enthesophyte suggestive for  chronic quadriceps tendinitis or enthesitis   PATIENT SURVEYS:  LEFS  Survey date:    Score total:  27/80 - 33.8%    COGNITION: Overall cognitive status: Within functional limits for tasks assessed     SENSATION: Tingling in the bottom of her feet mostly but going up to leg sometimes; also UE N/T - neuropathy   MUSCLE LENGTH: Hamstrings: bilateral tightness  noted with increased tightness R > L  POSTURE: rounded shoulders, forward head, and increased thoracic kyphosis  PALPATION:    LOWER EXTREMITY ROM:  Active ROM Right eval Left eval  Hip flexion    Hip extension    Hip abduction    Hip adduction    Hip internal rotation    Hip external rotation    Knee flexion    Knee extension    Ankle dorsiflexion    Ankle plantarflexion    Ankle inversion    Ankle eversion     (Blank rows = not tested)  LOWER EXTREMITY MMT:  MMT Right eval Left eval  Hip flexion 4- 4-  Hip extension    Hip abduction 4- 4-  Hip adduction 4- 4-  Hip internal rotation    Hip external rotation    Knee flexion 4- 4-  Knee extension 4 4  Ankle dorsiflexion 4 4-  Ankle plantarflexion    Ankle inversion    Ankle eversion     (Blank rows = not tested)  FUNCTIONAL TESTS:  5 times sit to stand: 17.3 s  30 seconds chair stand test 9 repetitions (pain went from 2/10 to a 4/10 mostly in the L knee)  Normative values for the 5TSTS according to research:  50-59 years: 7.7  2.6 seconds.   GAIT: Distance walked: 22' w/ no increased in pain (maintained 2/10 pain)  Comments: Increased hip adduction and excessive pronation bilaterally; genu valgum bilaterally; trendelenburg; decreased trunk rotation/arm swing                                                                                                                               TREATMENT DATE:  01/03/24 Nustep seat 4 level 3 UE/LE 5 minutes Green theraband rows 2X10 Green theraband extensions 2X10 Pallof GTB  neutral stance 2X10 each direction Standing: Vectors with 1 UE assist 10X5 each LE   Forward Step up 6in 1 UE assistance 10x2 each LE Lateral step up 6in step height with 1 UE assist eccentric lowering  Forward step down 4in with 1 HHA 2X 10 each  Forward lunges onto 4 step no UE assist 2X10 each   12/26/23: Bike full revolution x 5' L3 resistance Standing  Vectors with 1 UE assist  10X5 each LE   Forward Step up 6in 1 UE assistance 10x2 each LE Lateral step up 6in step height with 1 UE assist eccentric lowering  Forward step down 4in with 1 HHA 2X 10 each  Forward lunges onto 4 step no UE assist 2X10 each Green theraband rows 2X20 Green theraband extensions 2X20  -  12/20/2023  -recumbent bike x 4' (seat 3) -Standing hip abduction isometric with HHA x 12 with 3'' isometric -Standing eccentric step down with cues for hip hinge on 4in box BUE on // bars 2x15 -Standing TKE with RTB 2x15 with BUE support -2in eccentric stepdown x 10 with single UE assist - Standing ankle DF against wall x 25 -Bosu ball lunges with hovering hands x 10 bilaterally 3-5'' hold  12/18/23: Reviewed goals Educated importance of HEP compliance for maximal benefits  Supine:  Bridge 10x  SLR 10x Sidelying:  Clam with RTB  Abduction Seated:  STS no HHA eccentric control  Hamstring stretch 2x 30 reports some relief in her back following stretch Standing:   Abduction with HHA  Marching alternating with ab set with intermittent HHA 10x 3-5  Sidestep RTB around thigh   12/07/23 - Eval and prescription of HEP for initiation of strengthening/stretching with pain management    PATIENT EDUCATION:  Education details: HEP, plan of care Person educated: Patient Education method: Explanation, Demonstration, and Tactile cues Education comprehension: verbalized understanding and returned demonstration  HOME EXERCISE PROGRAM: Access Code: [email or web address] URL: [email or web address] Date: 12/07/2023 Prepared by: Lamarr Citrin  Exercises - Beginner Bridge  - 1 x daily - 7 x weekly - 3 sets - 10 reps - 3s hold - Straight Leg Raise  - 1 x daily - 7 x weekly - 2 sets - 10 reps - 5s hold - Seated Hamstring Stretch  - 1 x daily - 7 x weekly - 2 sets - 6 reps - 15s  hold  - Clam with Resistance  - 2 x daily - 7 x weekly - 1 sets - 10 reps - 3-5 hold - Beginner Side Leg Lift  - 1 x  daily - 7 x weekly - 3 sets - 10 reps - 3-5 hold - Sit to Stand Without Arm Support  - 2 x daily - 7 x weekly - 1 sets - 10 reps  - Heel Toe Raises with Counter Support  - 1 x daily - 7 x weekly - 1-2 sets - 25 reps - Standing Terminal Knee Extension with Resistance  - 1 x daily - 7 x weekly - 3 sets - 15 reps  12/26/23: - Single Leg Stance  - 2 x daily - 7 x weekly - 1 sets - 3 reps - 30 hold - Standing Tandem Balance with Counter Support  - 2 x daily - 7 x weekly - 1 sets - 3 reps - 30 hold  ASSESSMENT:  CLINICAL IMPRESSION:  01/03/24: Began session on nustep for warmup today to address UE as well as LE.  Added pallof with theraband in addition to postural  strengthening.  Pt with difficulty maintaining midline with pallof push outs due to weak core.  Continued with vectors with improved stability and less cues.  Able to complete full session today with only 1 seated rest break.  Only with some leg soreness at end of session but no pains.     Continued with squats with encouragement to complete with deeper ROM. Increased to 6 for forward and lateral step ups.  Pt unable to complete forward step downs with 6 as too challenging but good control and no pain with 4 inch.  Added postural strengthening with theraband today.  Cues needed to keep head up and stabilize core .  Lunges also added to challenge stability today. Pt able to complete all exercises today without c/o Cues for correct form to challenge eccentric element when lowering.  Also decreased UE assist where able.  No reports of pain or issues while completing session today and also no rest breakd taken.      Eval:  Patient is a 53 y.o. female who was seen today for physical therapy evaluation and treatment for bilateral chronic knee pain exacerbated with walking long periods, stair navigation, and standing prolonged periods. Presents with weakness in hip musculature, impaired joint integrity, decreased functional endurance, and poor  biomechanics with squatting/sit to stand and with gait with good prognosis for improving pain tolerance to activity with routine skilled PT services and maintenance of compliance with HEP.   OBJECTIVE IMPAIRMENTS: Abnormal gait, decreased activity tolerance, decreased endurance, difficulty walking, decreased strength, impaired flexibility, and improper body mechanics.   ACTIVITY LIMITATIONS: carrying, standing, squatting, stairs, transfers, and locomotion level  PARTICIPATION LIMITATIONS: shopping and community activity  PERSONAL FACTORS: 3+ comorbidities: liver disease, cardiac disease, and hx of lupus are also affecting patient's functional outcome.   REHAB POTENTIAL: Good  CLINICAL DECISION MAKING: Evolving/moderate complexity  EVALUATION COMPLEXITY: Moderate   GOALS: Goals reviewed with patient? No  SHORT TERM GOALS: Target date: 12/20/23 Pt will be independent with compliance with HEP at least 5/7 days of the week.  Baseline: prescribed Goal status: INITIAL  2.  Pt will be able to demonstrate straight leg raise 30 repetitions through full height without increased pain in back/LE to indicate compliance with HEP and improved strengthening. Baseline: decreased ROM and increased posterior hip pain on L > R Goal status: INITIAL  LONG TERM GOALS: Target date: 01/18/24  Pt will be able to progress and be independent with HEP for B knee strengthening and stretching as appropriate.  Baseline: prescribed Goal status: INITIAL  2.  Pt will report improved functional ADL tolerance as indicated by improved LEFS score by at least 15 points.  Baseline: 27/80 Goal status: INITIAL  3.  Pt will improve her gait distance during by at least 75' with no increased pain in order to indicate improved functional BLE endurance and appropriate gait mechanics needed for functional mobility.  Baseline:  Goal status: INITIAL  4.  Pt 30s sit to stand will be improved by 4 repetitions and no  increase in pain in order to indicate improved functional BLE strength.  Baseline: 9 repetitions  Goal status: INITIAL  PLAN:  PT FREQUENCY: 2x/week  PT DURATION: 6 weeks  PLANNED INTERVENTIONS: 97110-Therapeutic exercises, 97530- Therapeutic activity, [id number]- Neuromuscular re-education, 97535- Self Care, [id number]- Manual therapy, [phone]- Gait training, [phone]- Orthotic Initial, [phone]- Electrical stimulation (unattended), [phone]- Electrical stimulation (manual), Patient/Family education, Balance training, Stair training, Taping, Dry Needling, Joint mobilization, and Joint manipulation  PLAN FOR NEXT SESSION:  hip/knee strengthening; core/proximal HEP engagement.    Greig KATHEE Fuse, PTA/CLT [facility] Ph: [phone]  Fuse Greig KATHEE, PTA 01/03/2024, 3:45 PM

## 2024-01-07 ENCOUNTER — Encounter (HOSPITAL_COMMUNITY): Admitting: Physical Therapy

## 2024-01-08 ENCOUNTER — Ambulatory Visit: Admitting: Internal Medicine

## 2024-01-08 ENCOUNTER — Encounter: Payer: Self-pay | Admitting: Internal Medicine

## 2024-01-08 VITALS — BP 144/82 | HR 93 | Resp 14 | Ht 60.0 in | Wt 194.0 lb

## 2024-01-08 DIAGNOSIS — G8929 Other chronic pain: Secondary | ICD-10-CM | POA: Insufficient documentation

## 2024-01-08 DIAGNOSIS — M25562 Pain in left knee: Secondary | ICD-10-CM | POA: Insufficient documentation

## 2024-01-08 DIAGNOSIS — R768 Other specified abnormal immunological findings in serum: Secondary | ICD-10-CM | POA: Insufficient documentation

## 2024-01-08 DIAGNOSIS — Z79899 Other long term (current) drug therapy: Secondary | ICD-10-CM | POA: Diagnosis not present

## 2024-01-08 DIAGNOSIS — M25561 Pain in right knee: Secondary | ICD-10-CM | POA: Insufficient documentation

## 2024-01-08 DIAGNOSIS — K754 Autoimmune hepatitis: Secondary | ICD-10-CM | POA: Insufficient documentation

## 2024-01-08 DIAGNOSIS — G5603 Carpal tunnel syndrome, bilateral upper limbs: Secondary | ICD-10-CM | POA: Insufficient documentation

## 2024-01-08 DIAGNOSIS — G894 Chronic pain syndrome: Secondary | ICD-10-CM | POA: Insufficient documentation

## 2024-01-08 DIAGNOSIS — M5136 Other intervertebral disc degeneration, lumbar region with discogenic back pain only: Secondary | ICD-10-CM | POA: Insufficient documentation

## 2024-01-09 LAB — COMPREHENSIVE METABOLIC PANEL WITH GFR
AG Ratio: 1.8 (calc) (ref 1.0–2.5)
ALT: 19 U/L (ref 6–29)
AST: 25 U/L (ref 10–35)
Albumin: 4.2 g/dL (ref 3.6–5.1)
Alkaline phosphatase (APISO): 82 U/L (ref 37–153)
BUN: 9 mg/dL (ref 7–25)
CO2: 27 mmol/L (ref 20–32)
Calcium: 9.2 mg/dL (ref 8.6–10.4)
Chloride: 104 mmol/L (ref 98–110)
Creat: 0.78 mg/dL (ref 0.50–1.03)
Globulin: 2.3 g/dL (ref 1.9–3.7)
Glucose, Bld: 114 mg/dL — ABNORMAL HIGH (ref 65–99)
Potassium: 4.3 mmol/L (ref 3.5–5.3)
Sodium: 139 mmol/L (ref 135–146)
Total Bilirubin: 0.6 mg/dL (ref 0.2–1.2)
Total Protein: 6.5 g/dL (ref 6.1–8.1)
eGFR: 91 mL/min/{1.73_m2} (ref >=60)

## 2024-01-09 LAB — SEDIMENTATION RATE: Sed Rate: 9 mm/h (ref 0–30)

## 2024-01-09 LAB — CBC WITH DIFFERENTIAL/PLATELET
Absolute Lymphocytes: 1101 {cells}/uL (ref 850–3900)
Absolute Monocytes: 768 {cells}/uL (ref 200–950)
Basophils Absolute: 51 {cells}/uL (ref 0–200)
Basophils Relative: 0.8 %
Eosinophils Absolute: 256 {cells}/uL (ref 15–500)
Eosinophils Relative: 4 %
HCT: 40 % (ref 35.0–45.0)
Hemoglobin: 13.2 g/dL (ref 11.7–15.5)
MCH: 30.3 pg (ref 27.0–33.0)
MCHC: 33 g/dL (ref 32.0–36.0)
MCV: 91.7 fL (ref 80.0–100.0)
MPV: 10.1 fL (ref 7.5–12.5)
Monocytes Relative: 12 %
Neutro Abs: 4224 {cells}/uL (ref 1500–7800)
Neutrophils Relative %: 66 %
Platelets: 251 10*3/uL (ref 140–400)
RBC: 4.36 10*6/uL (ref 3.80–5.10)
RDW: 12.2 % (ref 11.0–15.0)
Total Lymphocyte: 17.2 %
WBC: 6.4 10*3/uL (ref 3.8–10.8)

## 2024-01-10 ENCOUNTER — Encounter (HOSPITAL_COMMUNITY)

## 2024-01-13 ENCOUNTER — Other Ambulatory Visit (HOSPITAL_COMMUNITY): Payer: Self-pay | Admitting: Psychiatry

## 2024-01-14 ENCOUNTER — Encounter (HOSPITAL_COMMUNITY): Payer: Self-pay

## 2024-01-14 ENCOUNTER — Encounter (HOSPITAL_COMMUNITY)

## 2024-01-14 NOTE — Therapy (Signed)
 [facility] Out[facility] Rehabilitation at [facility] [address] [place], [place], [address] Phone: [phone]   Fax:  [phone]  Patient Details  Name: Lindsay Walsh MRN: [id number] Date of Birth: 09-Dec-1970 Referring Provider:  No ref. provider found  Encounter Date: 01/14/2024  Pt called regarding no show #1. Left voicemail. Pt informed/reminded of no show policy.   Omega JONETTA Bottcher, PT 01/14/2024, 3:41 PM  [facility] Outpatient Rehabilitation at [facility] [address] [place], [place], [address] Phone: [phone]   Fax:  [phone]

## 2024-01-15 ENCOUNTER — Ambulatory Visit

## 2024-01-15 ENCOUNTER — Ambulatory Visit: Attending: Internal Medicine

## 2024-01-15 ENCOUNTER — Ambulatory Visit (INDEPENDENT_AMBULATORY_CARE_PROVIDER_SITE_OTHER): Admitting: Internal Medicine

## 2024-01-15 VITALS — BP 132/89 | HR 108

## 2024-01-15 DIAGNOSIS — G5603 Carpal tunnel syndrome, bilateral upper limbs: Secondary | ICD-10-CM | POA: Diagnosis not present

## 2024-01-15 MED ORDER — TRIAMCINOLONE ACETONIDE 40 MG/ML IJ SUSP
40.0000 mg | INTRAMUSCULAR | Status: AC | PRN
  Administered 2024-01-15: 40 mg

## 2024-01-15 MED ORDER — LIDOCAINE HCL 1 % IJ SOLN
1.0000 mL | INTRAMUSCULAR | Status: AC | PRN
  Administered 2024-01-15: 1 mL

## 2024-01-15 NOTE — Progress Notes (Signed)
   Procedure Note  Patient: Lindsay Walsh             Date of Birth: [birth year]           MRN: [id number]             Visit Date: 01/15/2024  Procedures: Visit Diagnoses:  1. Bilateral carpal tunnel syndrome    Right wrist carpal tunnel injection   Ultrasound guided injection is preferred based studies that show increased  duration, increased effect, greater accuracy, decreased procedural pain,  increased response rate, and decreased cost with ultrasound guided versus  blind injection. Verbal informed consent obtained.  Time-out conducted.   Noted no overlying erythema, induration, or other signs of local  infection. Ultrasound-guided right carpal tunnel injection. After sterile  prep with Betadine, injected 1 mL 1% lidocaine  and 40 mg kenalog  using a  27g needle by ulnar approach.   Images 1-2 show advancement of needle next to median nerve.  Image 3 with  color Doppler enhancement visualizing medication flow and closed within  carpal tunnel space.  Images 4-9 shows injection of medication into carpal  tunnel space with further advancement deep to median nerve.    Left wrist carpal tunnel injection   Ultrasound guided injection is preferred based studies that show increased  duration, increased effect, greater accuracy, decreased procedural pain,  increased response rate, and decreased cost with ultrasound guided versus  blind injection. Verbal informed consent obtained.  Time-out conducted.   Noted no overlying erythema, induration, or other signs of local  infection. Ultrasound-guided left carpal tunnel injection. After sterile  prep with Betadine, injected 1 mL 1% lidocaine  and 40 mg kenalog  using a  27g needle by radial approach.   Images 1-2 shows advancement of needle next to median nerve. Images 3-9  shows injection of medication into carpal tunnel space with needle  advancement to bot sides of nerve    Hand/UE Inj: R carpal tunnel for carpal tunnel syndrome on  01/15/2024 2:40 PM Indications: pain Details: 27 G needle, ultrasound-guided ulnar approach Medications: 1 mL lidocaine  1 %; 40 mg triamcinolone  acetonide 40 MG/ML Outcome: tolerated well, no immediate complications Procedure, treatment alternatives, risks and benefits explained, specific risks discussed. Consent was given by the patient. Immediately prior to procedure a time out was called to verify the correct patient, procedure, equipment, support staff and site/side marked as required. Patient was prepped and draped in the usual sterile fashion.    Hand/UE Inj: L carpal tunnel for carpal tunnel syndrome on 01/15/2024 2:45 PM Indications: pain Details: 27 G needle, ultrasound-guided radial approach Medications: 1 mL lidocaine  1 %; 40 mg triamcinolone  acetonide 40 MG/ML Outcome: tolerated well, no immediate complications Procedure, treatment alternatives, risks and benefits explained, specific risks discussed. Consent was given by the patient. Immediately prior to procedure a time out was called to verify the correct patient, procedure, equipment, support staff and site/side marked as required. Patient was prepped and draped in the usual sterile fashion.

## 2024-01-17 ENCOUNTER — Encounter (HOSPITAL_COMMUNITY)

## 2024-01-21 ENCOUNTER — Encounter (HOSPITAL_COMMUNITY)

## 2024-01-24 ENCOUNTER — Telehealth (HOSPITAL_COMMUNITY): Payer: Self-pay

## 2024-01-24 ENCOUNTER — Encounter (HOSPITAL_COMMUNITY)

## 2024-01-24 NOTE — Telephone Encounter (Signed)
 No Show #2 Called patient concerning missed treatment session today. No answer. Left voicemail with reminders of No Show policy and next scheduled visit on 01/30/24   5:00 PM, 01/24/24 Rosaria Settler, PT, DPT [facility]

## 2024-01-30 ENCOUNTER — Encounter (HOSPITAL_COMMUNITY): Payer: Self-pay

## 2024-01-30 ENCOUNTER — Telehealth (HOSPITAL_COMMUNITY): Payer: Self-pay | Admitting: Physical Therapy

## 2024-01-30 ENCOUNTER — Encounter (HOSPITAL_COMMUNITY): Admitting: Physical Therapy

## 2024-01-30 NOTE — Therapy (Signed)
 [facility] Outpatient Rehabilitation at [facility] [address] [place], [place], [address] Phone: [phone]   Fax:  [phone]  Patient Details  Name: Lindsay Walsh MRN: [id number] Date of Birth: 01/14/1971 Referring Provider:  No ref. provider found  Encounter Date: 01/30/2024  PHYSICAL THERAPY DISCHARGE SUMMARY  Visits from Start of Care: 6  Current functional level related to goals / functional outcomes: Unable to assess secondary to 3 no show appointments   Remaining deficits: Unable to assess secondary to d/c due to no show   Education / Equipment: N/a   Patient agrees to discharge. Patient goals were unable to be reassessed. Patient is being discharged due to not returning since the last visit.   Lamarr LITTIE Citrin, PT 01/30/2024, 4:11 PM  [facility] Rehabilitation at [facility] [address] [place], [place], [address] Phone: [phone]   Fax:  [phone]

## 2024-01-30 NOTE — Telephone Encounter (Signed)
 Today was pt's 3rd NS.  Called and left VM regarding discharge per NS policy.  Message sent to evaluating PT to discharge pt from physical therapy.    Greig KATHEE Fuse, PTA/CLT [facility] Ph: [phone]

## 2024-02-01 ENCOUNTER — Other Ambulatory Visit (HOSPITAL_COMMUNITY): Payer: Self-pay | Admitting: Psychiatry

## 2024-02-04 ENCOUNTER — Encounter: Payer: Self-pay | Admitting: Physical Medicine and Rehabilitation

## 2024-02-04 ENCOUNTER — Ambulatory Visit (INDEPENDENT_AMBULATORY_CARE_PROVIDER_SITE_OTHER): Admitting: Physical Medicine and Rehabilitation

## 2024-02-04 DIAGNOSIS — M5416 Radiculopathy, lumbar region: Secondary | ICD-10-CM

## 2024-02-04 DIAGNOSIS — M797 Fibromyalgia: Secondary | ICD-10-CM

## 2024-02-04 DIAGNOSIS — G8929 Other chronic pain: Secondary | ICD-10-CM

## 2024-02-04 DIAGNOSIS — M7918 Myalgia, other site: Secondary | ICD-10-CM

## 2024-02-04 DIAGNOSIS — M5442 Lumbago with sciatica, left side: Secondary | ICD-10-CM

## 2024-02-04 DIAGNOSIS — M542 Cervicalgia: Secondary | ICD-10-CM | POA: Diagnosis not present

## 2024-02-04 DIAGNOSIS — M5441 Lumbago with sciatica, right side: Secondary | ICD-10-CM

## 2024-02-04 NOTE — Progress Notes (Unsigned)
 Lindsay Walsh - 53 y.o. female MRN [id number]  Date of birth: January 15, 1971  Office Visit Note: Visit Date: 02/04/2024 PCP: Kahoano, Haku K, MD Referred by: Kahoano, Haku K, MD  Subjective: Chief Complaint  Patient presents with   Neck - Pain   Middle Back - Pain   Lower Back - Pain   HPI: Lindsay Walsh is a 53 y.o. female who comes in today per the request of Dr. Lonni Ester for evaluation of 2 issues, chronic neck and lower back pain. Neck pain seems to be most severe at this time. She reports pain to bilateral neck. Pain ongoing for several years, no specific aggravating factors. She describes pain as spasm and tightness sensation, currently rates as 7 out of 10. Her pain seems to be most severe in the morning after waking up. Some relief of pain with home exercise regimen, rest and use of medications. She does carry diagnosis of chronic pain syndrome that is managed by Dr. Haku Kahoano with [facility], she is prescribed Hydrocodone. Cervical MRI from 2012 minimal spondylosis of the cervical spine. Central canal and foramina are widely patent at all levels. No history of cervical surgery/injections.   Also reports bilateral lower back pain radiating to hips and down legs. Pain ongoing for several years. Her pain worsens with prolonged sitting and standing. Reports difficulty performing household tasks such as washing dishes and cooking due to severe pain. She describes pain as sore and aching sensation, currently rates as 5 out of 10. Some relief of pain with home exercise regimen, rest and use of medications. Lumbar MRI imaging from 2024 shows subtle grade 1 anterolisthesis and moderate facet arthropathy at L4-L5. No high grade spinal canal stenosis noted.She was previously treated at [facility] where she underwent pain infusions, lumbar epidural steroid injections and radiofrequency ablation. Minimal relief of pain with these procedures. History of surgical  consultation with Dr. Rockey Peru at [facility], per patient she was not surgical candidate at that time. Patient denies focal weakness, numbness and tingling.   Patient does carry diagnosis of diabetes mellitus, diabetic gastroparesis, rheumatoid arthritis, autoimmune hepatitis and fibromyalgia. She is treated by [facility] from rheumatological standpoint.      Review of Systems  Musculoskeletal:  Positive for back pain, myalgias and neck pain.  Neurological:  Negative for tingling, sensory change, focal weakness and weakness.  All other systems reviewed and are negative.  Otherwise per HPI.  Assessment & Plan: Visit Diagnoses:    ICD-10-CM   1. Cervicalgia  M54.2 MR CERVICAL SPINE WO CONTRAST    Ambulatory referral to Physical Therapy    2. Chronic bilateral low back pain with bilateral sciatica  M54.42 Ambulatory referral to Physical Therapy   M54.41    G89.29     3. Lumbar radiculopathy  M54.16 Ambulatory referral to Physical Therapy    4. Myofascial pain syndrome  M79.18 Ambulatory referral to Physical Therapy    5. Fibromyalgia  M79.7 Ambulatory referral to Physical Therapy       Plan: Findings:  1. Chronic, worsening and severe bilateral neck pain. No radicular symptoms down the arms. She continues to have severe pain despite good conservative therapies such as home exercise regimen, rest and use of medications. Patients clinical presentation and exam are consistent with myofascial pain syndrome. Prior cervical MRI imaging from 2012 showed mild structural findings, no severe spinal canal stenosis noted. We discussed treatment plan in detail today. Next step is to place order for new  cervical MRI imaging. I also placed order for short course of physical therapy to address both neck and lower back issues. I will see her back for cervical MRI review and to discuss treatment options. Her exam today was non focal, good strength noted to bilateral upper extremities. No myelopathic  symptoms noted.   2. Chronic, worsening and severe bilateral lower back pain radiating to hips and down legs. Patient continues to have severe pain despite good conservative therapies such as home exercise regimen, rest and use of medications. Patients clinical presentation and exam do not directly correlate with recent lumbar MRI imaging. Pain radiating down the legs seems to be more non dermatomal. Previous lumbar epidural steroid injections and lumbar radiofrequency ablation was not affective in reducing her pain. We would recommend short course of formal physical therapy and continued chronic pain management. No red flag symptoms noted upon exam today.     Meds & Orders: No orders of the defined types were placed in this encounter.   Orders Placed This Encounter  Procedures   MR CERVICAL SPINE WO CONTRAST   Ambulatory referral to Physical Therapy    Follow-up: Return for Cervical MRI review.   Procedures: No procedures performed      Clinical History: No specialty comments available.   She reports that she quit smoking about 10 years ago. Her smoking use included cigarettes. She started smoking about 35 years ago. She has a 37.5 pack-year smoking history. She has never been exposed to tobacco smoke. She has never used smokeless tobacco. No results for input(s): HGBA1C, LABURIC in the last 8760 hours.  Objective:  VS:  HT:    WT:   BMI:     BP:   HR: bpm  TEMP: ( )  RESP:  Physical Exam Vitals and nursing note reviewed.  HENT:     Head: Normocephalic and atraumatic.     Right Ear: External ear normal.     Left Ear: External ear normal.     Nose: Nose normal.     Mouth/Throat:     Mouth: Mucous membranes are moist.  Eyes:     Extraocular Movements: Extraocular movements intact.  Cardiovascular:     Rate and Rhythm: Normal rate.     Pulses: Normal pulses.  Pulmonary:     Effort: Pulmonary effort is normal.  Abdominal:     General: Abdomen is flat.   Musculoskeletal:        General: Tenderness present.     Cervical back: Tenderness present.     Comments: No discomfort noted with flexion, extension and side-to-side rotation. Patient has good strength in the upper extremities including 5 out of 5 strength in wrist extension, long finger flexion and APB. Shoulder range of motion is full bilaterally without any sign of impingement. There is no atrophy of the hands intrinsically. Sensation intact bilaterally. Myofascial tenderness noted to left levator scapulae and trapezius regions upon palpation. Negative Hoffman's sign. Negative Spurling's sign.   Patient rises from seated position to standing without difficulty. Good lumbar range of motion. No pain noted with facet loading. 5/5 strength noted with bilateral hip flexion, knee flexion/extension, ankle dorsiflexion/plantarflexion and EHL. No clonus noted bilaterally. No pain upon palpation of greater trochanters. No pain with internal/external rotation of bilateral hips. Sensation intact bilaterally. Myofascial tenderness noted to bilateral lumbar paraspinal regions upon palpation. Negative slump test bilaterally. Ambulates without aid, gait steady.       Skin:    General: Skin is warm and dry.  Capillary Refill: Capillary refill takes less than 2 seconds.  Neurological:     General: No focal deficit present.     Mental Status: She is alert and oriented to person, place, and time.     Ortho Exam  Imaging: No results found.  Past Medical/Family/Surgical/Social History: Medications & Allergies reviewed per EMR, new medications updated. Patient Active Problem List   Diagnosis Date Noted   Abdominal bloating 10/10/2023   Pain in right knee 03/28/2023   Diabetes mellitus without complication (HCC) 03/27/2023   Liver fibrosis 03/02/2023   Pain in left shoulder 09/25/2022   Progressive angina (HCC)    Coronary artery calcification seen on CT scan    History of nuclear stress test     Abnormal nuclear stress test 04/11/2022   Atypical angina (HCC) 04/11/2022   Bilateral carpal tunnel syndrome 04/05/2022   Positive ANA (antinuclear antibody) 11/16/2021   History of colonic polyps 11/15/2021   Bilateral hand pain 06/02/2020   OAB (overactive bladder) 11/05/2019   Recurrent UTI 11/05/2019   Diabetic gastroparesis (HCC) 08/21/2019   Diarrhea 07/15/2019   Abnormal finding on urinalysis 07/15/2019   Morbid (severe) obesity due to excess calories (HCC)    Major depressive disorder, single episode, unspecified    Restless legs syndrome    Sleep apnea    Fibromyalgia    Hereditary and idiopathic neuropathy, unspecified    Nonspecific mesenteric lymphadenitis    Pain in right foot    Type 2 diabetes mellitus with hyperglycemia (HCC)    Hypothyroidism 08/22/2018   DM type 2 causing vascular disease (HCC) 08/21/2018   Hyperlipidemia associated with type 2 diabetes mellitus (HCC) 08/21/2018   Essential hypertension, benign 08/21/2018   Influenza A 08/10/2018   SIRS (systemic inflammatory response syndrome) (HCC) 08/10/2018   Cerebral infarction (HCC) 03/05/2015   Dyspareunia 05/06/2014   Obesity 09/30/2013   Bilateral hip pain 07/16/2013   Chronic pain syndrome 07/16/2013   Degenerative disc disease, lumbar 07/16/2013   Greater trochanteric bursitis of both hips 07/16/2013   Nightmares associated with chronic post-traumatic stress disorder 10/18/2012   Sprain of foot 03/14/2012   Weakness 01/29/2012   Irritable bowel syndrome with diarrhea 08/01/2011   Gastroesophageal reflux disease    Depression    Chest pain    Fasting hyperglycemia    TOBACCO ABUSE 04/28/2010   NARCOTIC ABUSE 04/28/2010   PULMONARY NODULE 04/28/2010   Autoimmune hepatitis (HCC) 04/28/2010   Myalgia and myositis 01/06/2009   Past Medical History:  Diagnosis Date   Acid reflux    Allergic rhinitis due to pollen    Anxiety    Arthritis    back   Autoimmune hepatitis (HCC)    Back pain     Connective tissue disease (HCC)    Depression    Dizziness and giddiness    Dyspareunia 05/06/2014   Dysrhythmia    palpatations; on propafenone   Essential (primary) hypertension    Fibromyalgia    Gastroesophageal reflux disease    Chronic abdominal pain; gastroparesis; globus hystericus; irritable bowel syndrome   Glaucoma    Hereditary and idiopathic neuropathy, unspecified    Hyperlipidemia    Irritable bowel syndrome    Major depressive disorder, single episode, unspecified    Morbid (severe) obesity due to excess calories (HCC)    Narcotic dependence (HCC)    Nonspecific mesenteric lymphadenitis    Overweight(278.02)    Pain in limb    Pain in right foot    PTSD (post-traumatic stress disorder)  Pulmonary nodule    Restless legs syndrome    Sleep apnea    on CPAP machine   Stroke (HCC) 2016   Systemic lupus erythematosus (SLE) in adult [facility])    Tobacco abuse    one pack per day; 35 pack years; quit in February 2023   Type 2 diabetes mellitus with hyperglycemia (HCC)    Family History  Problem Relation Age of Onset   Diabetes Mother    Hypertension Mother    Anxiety disorder Mother    Depression Mother    Drug abuse Mother    Heart disease Father    Diabetes Father    Anxiety disorder Father    Alcohol abuse Father    Depression Father    OCD Father    Thyroid  disease Sister    Healthy Brother    Healthy Daughter    Healthy Son    ADD / ADHD Son    Alcohol abuse Paternal Grandfather    Depression Paternal Grandfather    Cancer Paternal Grandfather        lung,skin   Tuberculosis Paternal Grandfather    Seizures Paternal Grandmother    Lupus Paternal Grandmother    ADD / ADHD Son    Anesthesia problems Neg Hx    Malignant hyperthermia Neg Hx    Pseudochol deficiency Neg Hx    Hypotension Neg Hx    Bipolar disorder Neg Hx    Dementia Neg Hx    Paranoid behavior Neg Hx    Schizophrenia Neg Hx    Sexual abuse Neg Hx    Physical abuse Neg Hx     Past Surgical History:  Procedure Laterality Date   ABDOMINAL HYSTERECTOMY     TAH&BSO   BACK SURGERY     BLADDER SUSPENSION  09/12/2011   Procedure: TRANSVAGINAL TAPE (TVT) PROCEDURE;  Surgeon: Emery LILLETTE Blaze, MD;  Location: [facility];  Service: Urology;  Laterality: N/A;   CESAREAN SECTION     X3   CHOLECYSTECTOMY     CORONARY PRESSURE/FFR STUDY N/A 04/21/2022   Procedure: INTRAVASCULAR PRESSURE WIRE/FFR STUDY;  Surgeon: Anner Alm ORN, MD;  Location: [facility] INVASIVE CV LAB;  Service: Cardiovascular;  Laterality: N/A;   ESOPHAGOGASTRODUODENOSCOPY ENDOSCOPY     throat stretched per pt.   LEFT HEART CATH AND CORONARY ANGIOGRAPHY N/A 04/21/2022   Procedure: LEFT HEART CATH AND CORONARY ANGIOGRAPHY;  Surgeon: Anner Alm ORN, MD;  Location: [facility] INVASIVE CV LAB;  Service: Cardiovascular;  Laterality: N/A;   LIVER BIOPSY     x4   LUMBAR EPIDURAL INJECTION  01/2012   TOTAL ABDOMINAL HYSTERECTOMY W/ BILATERAL SALPINGOOPHORECTOMY     TUBAL LIGATION     Bilateral   UPPER GASTROINTESTINAL ENDOSCOPY  09/13/2010   Social History   Occupational History    Employer: NOT EMPLOYED  Tobacco Use   Smoking status: Former    Current packs/day: 0.00    Average packs/day: 1.5 packs/day for 25.0 years (37.5 ttl pk-yrs)    Types: Cigarettes    Start date: 04/25/1988    Quit date: 04/25/2013    Years since quitting: 10.7    Passive exposure: Never   Smokeless tobacco: Never  Vaping Use   Vaping status: Every Day   Start date: 04/22/2013   Substances: Nicotine , Flavoring  Substance and Sexual Activity   Alcohol use: No   Drug use: No   Sexual activity: Yes    Birth control/protection: Surgical, Abstinence

## 2024-02-04 NOTE — Progress Notes (Unsigned)
 Core Outcome Measures Index (COMI) Back Score  Average Pain 7  COMI Score 80 %

## 2024-02-04 NOTE — Progress Notes (Unsigned)
 Pain Scale   Average Pain 7  Pain feeling: sharp, dull, and stabbing  Neck and full back pain: radiates to arms and legs Duration: years  Pain management - Dr. MARLA at [facility] Injections, nerve burning, infusions- no relief   107/78

## 2024-02-05 ENCOUNTER — Ambulatory Visit (HOSPITAL_COMMUNITY)
Admission: RE | Admit: 2024-02-05 | Discharge: 2024-02-05 | Disposition: A | Discharge status: Home / Self Care | Source: Ambulatory Visit | Attending: Physical Medicine and Rehabilitation | Admitting: Physical Medicine and Rehabilitation

## 2024-02-05 DIAGNOSIS — M542 Cervicalgia: Secondary | ICD-10-CM | POA: Diagnosis present

## 2024-02-07 ENCOUNTER — Telehealth: Payer: Self-pay | Admitting: Internal Medicine

## 2024-02-07 NOTE — Telephone Encounter (Signed)
 Pt wanted to inform Dr Jeannetta she has an appointment with [facility] on 07/23/24 2PM

## 2024-02-08 NOTE — Telephone Encounter (Signed)
 I would like her to have this checked sooner than next year, as she has been on hydroxychloroquine  for years and previously had 6 month f/u recommended with them.

## 2024-02-10 ENCOUNTER — Other Ambulatory Visit: Payer: Self-pay | Admitting: Internal Medicine

## 2024-02-10 DIAGNOSIS — M79642 Pain in left hand: Secondary | ICD-10-CM

## 2024-02-10 DIAGNOSIS — R768 Other specified abnormal immunological findings in serum: Secondary | ICD-10-CM

## 2024-02-11 NOTE — Telephone Encounter (Signed)
 I called patient, patient will call to see if there have been any cancellations, 1st appt with Dr. Milford office 07/2024.

## 2024-02-11 NOTE — Telephone Encounter (Signed)
 Attempted to contact the patient and left a message to call the office back.

## 2024-02-15 ENCOUNTER — Telehealth: Payer: Self-pay | Admitting: Physical Medicine and Rehabilitation

## 2024-02-15 NOTE — Telephone Encounter (Signed)
 Pt called stating she has been waiting on a call from [facility] or Megan for MRI Results. Pt states Megan told pt they would call and let her know if she need to make an appt to go over results or call with results over the phone. Please call pt about this matter at [phone].

## 2024-02-18 NOTE — Telephone Encounter (Signed)
 Lmx1 that MRI results not ready, and for patient to call for MRI review, next week. I have called reading room also.

## 2024-02-29 ENCOUNTER — Ambulatory Visit: Admitting: Physical Medicine and Rehabilitation

## 2024-03-06 ENCOUNTER — Ambulatory Visit: Admitting: Physical Medicine and Rehabilitation

## 2024-03-06 ENCOUNTER — Encounter: Payer: Self-pay | Admitting: Physical Medicine and Rehabilitation

## 2024-03-06 DIAGNOSIS — M797 Fibromyalgia: Secondary | ICD-10-CM

## 2024-03-06 DIAGNOSIS — M47812 Spondylosis without myelopathy or radiculopathy, cervical region: Secondary | ICD-10-CM | POA: Diagnosis not present

## 2024-03-06 DIAGNOSIS — M542 Cervicalgia: Secondary | ICD-10-CM

## 2024-03-06 NOTE — Progress Notes (Signed)
 Pain Scale   Average Pain 5 Patient advising she has neck pain radiating to bilateral shoulders, without relief        +Driver, -BT, -Dye Allergies.

## 2024-03-06 NOTE — Progress Notes (Signed)
 Lindsay Walsh - 53 y.o. female MRN [id number]  Date of birth: 1970/10/10  Office Visit Note: Visit Date: 03/06/2024 PCP: Kahoano, Haku K, MD Referred by: Kahoano, Haku K, MD  Subjective: Chief Complaint  Patient presents with   Neck - Pain   HPI: Lindsay Walsh is a 53 y.o. female who comes in today for evaluation of chronic, worsening and severe bilateral neck pain, worse on the left. Pain ongoing for several years. No specific aggravating factors. She describes her pain as sore and tight sensation, currently rates as 5 out of 10. Some relief of pain with home exercise regimen, heating pad, rest and use of medications. Some relief of pain with home exercise regimen, rest and use of medications. She does carry diagnosis of chronic pain syndrome that is managed by Dr. Haku Kahoano with [facility], she is prescribed Hydrocodone. Recent cervical MRI imaging shows severe facet arthropathy on the left at C3-C4 with edema in the articular processes. Severe left facet arthropathy at C4-C5 and moderate left facet arthropathy at C5-C6. No high grade spinal canal stenosis. Patient denies focal weakness, numbness and tingling. No recent trauma or falls.       Review of Systems  Musculoskeletal:  Positive for myalgias and neck pain.  Neurological:  Negative for tingling, sensory change, focal weakness and weakness.  All other systems reviewed and are negative.  Otherwise per HPI.  Assessment & Plan: Visit Diagnoses:    ICD-10-CM   1. Cervicalgia  M54.2 Ambulatory referral to Physical Medicine Rehab    2. Spondylosis without myelopathy or radiculopathy, cervical region  M47.812 Ambulatory referral to Physical Medicine Rehab    3. Facet arthropathy, cervical  M47.812 Ambulatory referral to Physical Medicine Rehab    4. Fibromyalgia  M79.7 Ambulatory referral to Physical Medicine Rehab       Plan: Findings:  Chronic, worsening and severe bilateral neck pain.  Patient  continues to have severe pain despite good conservative therapy such as home exercise regimen, rest and use of medications.  Patient's clinical presentation and exam are consistent with facet mediated pain.  I discussed recent cervical MRI with her today using imaging and spine model.  There is severe facet arthropathy on the left at C3-C4 and C4-C5.  She does have pain with side-to-side rotation today.  We discussed treatment plan in detail today.  Neck step is to perform diagnostic and hopefully therapeutic bilateral C3-C4 and C4-C5 facet joint injections under fluoroscopic guidance.  If good relief of pain with facet joint injections we can repeat these procedures infrequently as needed.  Could also look at longer sustained pain relief with radiofrequency ablation.  Patient has no questions at this time.  No red flag symptoms noted upon exam today.    Meds & Orders: No orders of the defined types were placed in this encounter.   Orders Placed This Encounter  Procedures   Ambulatory referral to Physical Medicine Rehab    Follow-up: Return for Bilateral C3-C4 and C4-C5 facet joint injections.   Procedures: No procedures performed      Clinical History: CLINICAL DATA:  Chronic neck pain   EXAM: MRI CERVICAL SPINE WITHOUT CONTRAST   TECHNIQUE: Multiplanar, multisequence MR imaging of the cervical spine was performed. No intravenous contrast was administered.   COMPARISON:  Plain film August 08, 2019   FINDINGS: The craniocervical junction is normal.   There is no significant bone marrow signal abnormality.   The cervical spinal cord is normal.  C2-C3: Normal   C3-C4: The disc is normal. There is severe facet arthropathy on the left with edema in the articular processes. There is mild left neural foraminal stenosis   C4-C5: The disc is normal. There is severe facet arthropathy on the left. There is mild left neural foraminal stenosis   C5-C6: The disc is normal. There is  moderate left facet arthropathy. No spinal stenosis or foraminal stenosis   C6-C7: Minimal disc bulge.  No significant facet disease   C7-T1: Normal   IMPRESSION: 1. Severe facet arthropathy on the left at C3-4 with edema in the articular processes. Severe left facet arthropathy at C4-5 and moderate left facet arthropathy at C5-6     Electronically Signed   By: Nancyann Burns M.D.   On: 02/18/2024 09:42   She reports that she quit smoking about 10 years ago. Her smoking use included cigarettes. She started smoking about 35 years ago. She has a 37.5 pack-year smoking history. She has never been exposed to tobacco smoke. She has never used smokeless tobacco. No results for input(s): HGBA1C, LABURIC in the last 8760 hours.  Objective:  VS:  HT:    WT:   BMI:     BP:   HR: bpm  TEMP: ( )  RESP:  Physical Exam Vitals and nursing note reviewed.  HENT:     Head: Normocephalic and atraumatic.     Right Ear: External ear normal.     Left Ear: External ear normal.     Nose: Nose normal.     Mouth/Throat:     Mouth: Mucous membranes are moist.  Eyes:     Extraocular Movements: Extraocular movements intact.  Cardiovascular:     Rate and Rhythm: Normal rate.     Pulses: Normal pulses.  Pulmonary:     Effort: Pulmonary effort is normal.  Abdominal:     General: Abdomen is flat. There is no distension.  Musculoskeletal:        General: Tenderness present.     Cervical back: Tenderness present.     Comments: Discomfort noted with side-to-side rotation. Patient has good strength in the upper extremities including 5 out of 5 strength in wrist extension, long finger flexion and APB. Shoulder range of motion is full bilaterally without any sign of impingement. There is no atrophy of the hands intrinsically. Sensation intact bilaterally. Negative Hoffman's sign. Negative Spurling's sign.     Skin:    General: Skin is warm and dry.     Capillary Refill: Capillary refill takes less than  2 seconds.  Neurological:     General: No focal deficit present.     Mental Status: She is alert and oriented to person, place, and time.  Psychiatric:        Mood and Affect: Mood normal.        Behavior: Behavior normal.     Ortho Exam  Imaging: No results found.  Past Medical/Family/Surgical/Social History: Medications & Allergies reviewed per EMR, new medications updated. Patient Active Problem List   Diagnosis Date Noted   Abdominal bloating 10/10/2023   Pain in right knee 03/28/2023   Diabetes mellitus without complication (HCC) 03/27/2023   Liver fibrosis 03/02/2023   Pain in left shoulder 09/25/2022   Progressive angina [facility])    Coronary artery calcification seen on CT scan    History of nuclear stress test    Abnormal nuclear stress test 04/11/2022   Atypical angina (HCC) 04/11/2022   Bilateral carpal tunnel syndrome 04/05/2022  Positive ANA (antinuclear antibody) 11/16/2021   History of colonic polyps 11/15/2021   Bilateral hand pain 06/02/2020   OAB (overactive bladder) 11/05/2019   Recurrent UTI 11/05/2019   Diabetic gastroparesis (HCC) 08/21/2019   Diarrhea 07/15/2019   Abnormal finding on urinalysis 07/15/2019   Morbid (severe) obesity due to excess calories (HCC)    Major depressive disorder, single episode, unspecified    Restless legs syndrome    Sleep apnea    Fibromyalgia    Hereditary and idiopathic neuropathy, unspecified    Nonspecific mesenteric lymphadenitis    Pain in right foot    Type 2 diabetes mellitus with hyperglycemia (HCC)    Hypothyroidism 08/22/2018   DM type 2 causing vascular disease (HCC) 08/21/2018   Hyperlipidemia associated with type 2 diabetes mellitus (HCC) 08/21/2018   Essential hypertension, benign 08/21/2018   Influenza A 08/10/2018   SIRS (systemic inflammatory response syndrome) (HCC) 08/10/2018   Cerebral infarction (HCC) 03/05/2015   Dyspareunia 05/06/2014   Obesity 09/30/2013   Bilateral hip pain 07/16/2013    Chronic pain syndrome 07/16/2013   Degenerative disc disease, lumbar 07/16/2013   Greater trochanteric bursitis of both hips 07/16/2013   Nightmares associated with chronic post-traumatic stress disorder 10/18/2012   Sprain of foot 03/14/2012   Weakness 01/29/2012   Irritable bowel syndrome with diarrhea 08/01/2011   Gastroesophageal reflux disease    Depression    Chest pain    Fasting hyperglycemia    TOBACCO ABUSE 04/28/2010   NARCOTIC ABUSE 04/28/2010   PULMONARY NODULE 04/28/2010   Autoimmune hepatitis (HCC) 04/28/2010   Myalgia and myositis 01/06/2009   Past Medical History:  Diagnosis Date   Acid reflux    Allergic rhinitis due to pollen    Anxiety    Arthritis    back   Autoimmune hepatitis (HCC)    Back pain    Connective tissue disease (HCC)    Depression    Dizziness and giddiness    Dyspareunia 05/06/2014   Dysrhythmia    palpatations; on propafenone   Essential (primary) hypertension    Fibromyalgia    Gastroesophageal reflux disease    Chronic abdominal pain; gastroparesis; globus hystericus; irritable bowel syndrome   Glaucoma    Hereditary and idiopathic neuropathy, unspecified    Hyperlipidemia    Irritable bowel syndrome    Major depressive disorder, single episode, unspecified    Morbid (severe) obesity due to excess calories (HCC)    Narcotic dependence (HCC)    Nonspecific mesenteric lymphadenitis    Overweight(278.02)    Pain in limb    Pain in right foot    PTSD (post-traumatic stress disorder)    Pulmonary nodule    Restless legs syndrome    Sleep apnea    on CPAP machine   Stroke (HCC) 2016   Systemic lupus erythematosus (SLE) in adult [facility])    Tobacco abuse    one pack per day; 35 pack years; quit in February 2023   Type 2 diabetes mellitus with hyperglycemia (HCC)    Family History  Problem Relation Age of Onset   Diabetes Mother    Hypertension Mother    Anxiety disorder Mother    Depression Mother    Drug abuse Mother     Heart disease Father    Diabetes Father    Anxiety disorder Father    Alcohol abuse Father    Depression Father    OCD Father    Thyroid  disease Sister    Healthy Brother  Healthy Daughter    Healthy Son    ADD / ADHD Son    Alcohol abuse Paternal Grandfather    Depression Paternal Grandfather    Cancer Paternal Grandfather        lung,skin   Tuberculosis Paternal Grandfather    Seizures Paternal Grandmother    Lupus Paternal Grandmother    ADD / ADHD Son    Anesthesia problems Neg Hx    Malignant hyperthermia Neg Hx    Pseudochol deficiency Neg Hx    Hypotension Neg Hx    Bipolar disorder Neg Hx    Dementia Neg Hx    Paranoid behavior Neg Hx    Schizophrenia Neg Hx    Sexual abuse Neg Hx    Physical abuse Neg Hx    Past Surgical History:  Procedure Laterality Date   ABDOMINAL HYSTERECTOMY     TAH&BSO   BACK SURGERY     BLADDER SUSPENSION  09/12/2011   Procedure: TRANSVAGINAL TAPE (TVT) PROCEDURE;  Surgeon: Emery LILLETTE Blaze, MD;  Location: [facility];  Service: Urology;  Laterality: N/A;   CESAREAN SECTION     X3   CHOLECYSTECTOMY     CORONARY PRESSURE/FFR STUDY N/A 04/21/2022   Procedure: INTRAVASCULAR PRESSURE WIRE/FFR STUDY;  Surgeon: Anner Alm ORN, MD;  Location: [facility] INVASIVE CV LAB;  Service: Cardiovascular;  Laterality: N/A;   ESOPHAGOGASTRODUODENOSCOPY ENDOSCOPY     throat stretched per pt.   LEFT HEART CATH AND CORONARY ANGIOGRAPHY N/A 04/21/2022   Procedure: LEFT HEART CATH AND CORONARY ANGIOGRAPHY;  Surgeon: Anner Alm ORN, MD;  Location[facility] INVAS[facility] CV LAB;  Service: Cardiovascular;  Laterality: N/A;   LIVER BIOPSY     x4   LUMBAR EPIDURAL INJECTION  01/2012   TOTAL ABDOMINAL HYSTERECTOMY W/ BILATERAL SALPINGOOPHORECTOMY     TUBAL LIGATION     Bilateral   UPPER GASTROINTESTINAL ENDOSCOPY  09/13/2010   Social History   Occupational History    Employer: NOT EMPLOYED  Tobacco Use   Smoking status: Former    Current packs/day: 0.00    Average  packs/day: 1.5 packs/day for 25.0 years (37.5 ttl pk-yrs)    Types: Cigarettes    Start date: 04/25/1988    Quit date: 04/25/2013    Years since quitting: 10.8    Passive exposure: Never   Smokeless tobacco: Never  Vaping Use   Vaping status: Every Day   Start date: 04/22/2013   Substances: Nicotine , Flavoring  Substance and Sexual Activity   Alcohol use: No   Drug use: No   Sexual activity: Yes    Birth control/protection: Surgical, Abstinence

## 2024-03-13 ENCOUNTER — Other Ambulatory Visit (INDEPENDENT_AMBULATORY_CARE_PROVIDER_SITE_OTHER): Payer: Self-pay | Admitting: Gastroenterology

## 2024-03-13 DIAGNOSIS — K219 Gastro-esophageal reflux disease without esophagitis: Secondary | ICD-10-CM

## 2024-03-14 ENCOUNTER — Encounter (INDEPENDENT_AMBULATORY_CARE_PROVIDER_SITE_OTHER): Payer: Self-pay | Admitting: Gastroenterology

## 2024-03-18 NOTE — Therapy (Signed)
 OUTPATIENT PHYSICAL THERAPY  CERVICAL AND THORACOLUMBAR EVALUATION   Patient Name: Lindsay Walsh MRN: [id number] DOB:[birth year], 53 y.o., female Today's Date: 03/18/2024  END OF SESSION:   Past Medical History:  Diagnosis Date   Acid reflux    Allergic rhinitis due to pollen    Anxiety    Arthritis    back   Autoimmune hepatitis (HCC)    Back pain    Connective tissue disease (HCC)    Depression    Dizziness and giddiness    Dyspareunia 05/06/2014   Dysrhythmia    palpatations; on propafenone   Essential (primary) hypertension    Fibromyalgia    Gastroesophageal reflux disease    Chronic abdominal pain; gastroparesis; globus hystericus; irritable bowel syndrome   Glaucoma    Hereditary and idiopathic neuropathy, unspecified    Hyperlipidemia    Irritable bowel syndrome    Major depressive disorder, single episode, unspecified    Morbid (severe) obesity due to excess calories (HCC)    Narcotic dependence (HCC)    Nonspecific mesenteric lymphadenitis    Overweight(278.02)    Pain in limb    Pain in right foot    PTSD (post-traumatic stress disorder)    Pulmonary nodule    Restless legs syndrome    Sleep apnea    on CPAP machine   Stroke (HCC) 2016   Systemic lupus erythematosus (SLE) in adult [facility])    Tobacco abuse    one pack per day; 35 pack years; quit in February 2023   Type 2 diabetes mellitus with hyperglycemia (HCC)    Past Surgical History:  Procedure Laterality Date   ABDOMINAL HYSTERECTOMY     TAH&BSO   BACK SURGERY     BLADDER SUSPENSION  09/12/2011   Procedure: TRANSVAGINAL TAPE (TVT) PROCEDURE;  Surgeon: Emery LILLETTE Blaze, MD;  Location: [facility];  Service: Urology;  Laterality: N/A;   CESAREAN SECTION     X3   CHOLECYSTECTOMY     CORONARY PRESSURE/FFR STUDY N/A 04/21/2022   Procedure: INTRAVASCULAR PRESSURE WIRE/FFR STUDY;  Surgeon: Anner Alm ORN, MD;  Location: [facility] INVASIVE CV LAB;  Service: Cardiovascular;  Laterality: N/A;    ESOPHAGOGASTRODUODENOSCOPY ENDOSCOPY     throat stretched per pt.   LEFT HEART CATH AND CORONARY ANGIOGRAPHY N/A 04/21/2022   Procedure: LEFT HEART CATH AND CORONARY ANGIOGRAPHY;  Surgeon: Anner Alm ORN, MD;  Location: [facility] INVASIVE CV LAB;  Service: Cardiovascular;  Laterality: N/A;   LIVER BIOPSY     x4   LUMBAR EPIDURAL INJECTION  01/2012   TOTAL ABDOMINAL HYSTERECTOMY W/ BILATERAL SALPINGOOPHORECTOMY     TUBAL LIGATION     Bilateral   UPPER GASTROINTESTINAL ENDOSCOPY  09/13/2010   Patient Active Problem List   Diagnosis Date Noted   Abdominal bloating 10/10/2023   Pain in right knee 03/28/2023   Diabetes mellitus without complication (HCC) 03/27/2023   Liver fibrosis 03/02/2023   Pain in left shoulder 09/25/2022   Progressive angina (HCC)    Coronary artery calcification seen on CT scan    History of nuclear stress test    Abnormal nuclear stress test 04/11/2022   Atypical angina (HCC) 04/11/2022   Bilateral carpal tunnel syndrome 04/05/2022   Positive ANA (antinuclear antibody) 11/16/2021   History of colonic polyps 11/15/2021   Bilateral hand pain 06/02/2020   OAB (overactive bladder) 11/05/2019   Recurrent UTI 11/05/2019   Diabetic gastroparesis (HCC) 08/21/2019   Diarrhea 07/15/2019   Abnormal finding on urinalysis 07/15/2019   Morbid (severe)  obesity due to excess calories (HCC)    Major depressive disorder, single episode, unspecified    Restless legs syndrome    Sleep apnea    Fibromyalgia    Hereditary and idiopathic neuropathy, unspecified    Nonspecific mesenteric lymphadenitis    Pain in right foot    Type 2 diabetes mellitus with hyperglycemia (HCC)    Hypothyroidism 08/22/2018   DM type 2 causing vascular disease (HCC) 08/21/2018   Hyperlipidemia associated with type 2 diabetes mellitus (HCC) 08/21/2018   Essential hypertension, benign 08/21/2018   Influenza A 08/10/2018   SIRS (systemic inflammatory response syndrome) (HCC) 08/10/2018   Cerebral  infarction (HCC) 03/05/2015   Dyspareunia 05/06/2014   Obesity 09/30/2013   Bilateral hip pain 07/16/2013   Chronic pain syndrome 07/16/2013   Degenerative disc disease, lumbar 07/16/2013   Greater trochanteric bursitis of both hips 07/16/2013   Nightmares associated with chronic post-traumatic stress disorder 10/18/2012   Sprain of foot 03/14/2012   Weakness 01/29/2012   Irritable bowel syndrome with diarrhea 08/01/2011   Gastroesophageal reflux disease    Depression    Chest pain    Fasting hyperglycemia    TOBACCO ABUSE 04/28/2010   NARCOTIC ABUSE 04/28/2010   PULMONARY NODULE 04/28/2010   Autoimmune hepatitis (HCC) 04/28/2010   Myalgia and myositis 01/06/2009    PCP: Kahoano, Haku K, MD  REFERRING PROVIDER: Trudy Duwaine BRAVO, NPWilliams, Duwaine BRAVO, NP  REFERRING DIAG: M54.2 (ICD-10-CM) - Cervicalgia M54.42,M54.41,G89.29 (ICD-10-CM) - Chronic bilateral low back pain with bilateral sciatica M54.16 (ICD-10-CM) - Lumbar radiculopathy M79.18 (ICD-10-CM) - Myofascial pain syndrome M79.7 (ICD-10-CM) - FibromyalgiaM54.2 (ICD-10-CM) - Cervicalgia M54.42,M54.41,G89.29 (ICD-10-CM) - Chronic bilateral low back pain with bilateral sciatica M54.16 (ICD-10-CM) - Lumbar radiculopathy M79.18 (ICD-10-CM) - Myofascial pain syndrome M79.7 (ICD-10-CM) - Fibromyalgia  Rationale for Evaluation and Treatment: Rehabilitation  THERAPY DIAG:  No diagnosis found.  ONSET DATE: ***  SUBJECTIVE:                                                                                                                                                                                           SUBJECTIVE STATEMENT: ***  PERTINENT HISTORY:  ***  PAIN:  Are you having pain? {OPRCPAIN:27236}  PRECAUTIONS: {Therapy precautions:24002}  RED FLAGS: {PT Red Flags:29287}   WEIGHT BEARING RESTRICTIONS: {Yes ***/No:24003}  FALLS:  Has patient fallen in last 6 months? {fallsyesno:27318}  LIVING ENVIRONMENT: Lives  with: {OPRC lives with:25569::lives with their family} Lives in: {Lives in:25570} Stairs: {opstairs:27293} Has following equipment at home: {Assistive devices:23999}  OCCUPATION: ***  PLOF: {PLOF:24004}  PATIENT GOALS: ***  NEXT MD VISIT: ***  OBJECTIVE:  Note: Objective  measures were completed at Evaluation unless otherwise noted.  DIAGNOSTIC FINDINGS:  ***  PATIENT SURVEYS:  {rehab surveys:24030}  COGNITION: Overall cognitive status: {cognition:24006}     SENSATION: {sensation:27233}  MUSCLE LENGTH: Hamstrings: Right *** deg; Left *** deg Debby test: Right *** deg; Left *** deg  POSTURE: {posture:25561}  PALPATION: ***  CERVICAL ROM:   {AROM/PROM:27142} ROM A/PROM (deg) eval  Flexion   Extension   Right lateral flexion   Left lateral flexion   Right rotation   Left rotation    (Blank rows = not tested)  LUMBAR ROM:   AROM eval  Flexion   Extension   Right lateral flexion   Left lateral flexion   Right rotation   Left rotation    (Blank rows = not tested)  LOWER EXTREMITY ROM:     {AROM/PROM:27142}  Right eval Left eval  Hip flexion    Hip extension    Hip abduction    Hip adduction    Hip internal rotation    Hip external rotation    Knee flexion    Knee extension    Ankle dorsiflexion    Ankle plantarflexion    Ankle inversion    Ankle eversion     (Blank rows = not tested)  LOWER EXTREMITY MMT:    MMT Right eval Left eval  Hip flexion    Hip extension    Hip abduction    Hip adduction    Hip internal rotation    Hip external rotation    Knee flexion    Knee extension    Ankle dorsiflexion    Ankle plantarflexion    Ankle inversion    Ankle eversion     (Blank rows = not tested)  LUMBAR SPECIAL TESTS:  {lumbar special test:25242}  FUNCTIONAL TESTS:  {Functional tests:24029}  GAIT: Distance walked: *** Assistive device utilized: {Assistive devices:23999} Level of assistance: {Levels of  assistance:24026} Comments: ***  TREATMENT DATE: 03/19/24 physical therapy evaluation and HEP instruction                                                                                                                                 PATIENT EDUCATION:  Education details: Patient educated on exam findings, POC, scope of PT, HEP, and ***. Person educated: Patient Education method: Explanation, Demonstration, and Handouts Education comprehension: verbalized understanding, returned demonstration, verbal cues required, and tactile cues required  HOME EXERCISE PROGRAM: ***  ASSESSMENT:  CLINICAL IMPRESSION: Patient is a 53 y.o. female who was seen today for physical therapy evaluation and treatment for M54.2 (ICD-10-CM) - Cervicalgia M54.42,M54.41,G89.29 (ICD-10-CM) - Chronic bilateral low back pain with bilateral sciatica M54.16 (ICD-10-CM) - Lumbar radiculopathy M79.18 (ICD-10-CM) - Myofascial pain syndrome M79.7 (ICD-10-CM) - FibromyalgiaM54.2 (ICD-10-CM) - Cervicalgia M54.42,M54.41,G89.29 (ICD-10-CM) - Chronic bilateral low back pain with bilateral sciatica M54.16 (ICD-10-CM) - Lumbar radiculopathy M79.18 (ICD-10-CM) - Myofascial pain syndrome M79.7 (ICD-10-CM) - Fibromyalgia.   OBJECTIVE IMPAIRMENTS: {opptimpairments:25111}.  ACTIVITY LIMITATIONS: {activitylimitations:27494}  PARTICIPATION LIMITATIONS: {participationrestrictions:25113}  PERSONAL FACTORS: {Personal factors:25162} are also affecting patient's functional outcome.   REHAB POTENTIAL: Good  CLINICAL DECISION MAKING: Evolving/moderate complexity  EVALUATION COMPLEXITY: Moderate   GOALS: Goals reviewed with patient? No  SHORT TERM GOALS: Target date: ***  *** Baseline: Goal status: INITIAL  2.  *** Baseline:  Goal status: INITIAL  3.  *** Baseline:  Goal status: INITIAL  4.  *** Baseline:  Goal status: INITIAL  5.  *** Baseline:  Goal status: INITIAL  6.  *** Baseline:  Goal status:  INITIAL  LONG TERM GOALS: Target date: ***  *** Baseline:  Goal status: INITIAL  2.  *** Baseline:  Goal status: INITIAL  3.  *** Baseline:  Goal status: INITIAL  4.  *** Baseline:  Goal status: INITIAL  5.  *** Baseline:  Goal status: INITIAL  6.  *** Baseline:  Goal status: INITIAL  PLAN:  PT FREQUENCY: {rehab frequency:25116}  PT DURATION: {rehab duration:25117}  PLANNED INTERVENTIONS: 97164- PT Re-evaluation, 97110-Therapeutic exercises, 97530- Therapeutic activity, 97112- Neuromuscular re-education, 97535- Self Care, 02859- Manual therapy, [id number]- Gait training, [id number]- Orthotic Fit/training, [id number]- Canalith repositioning, [id number]- Aquatic Therapy, 97760- Splinting, 97597- Wound care (first 20 sq cm), 97598- Wound care (each additional 20 sq cm)Patient/Family education, Balance training, Stair training, Taping, Dry Needling, Joint mobilization, Joint manipulation, Spinal manipulation, Spinal mobilization, Scar mobilization, and DME instructions.  SABRA  PLAN FOR NEXT SESSION: Review HEP and goals   10:59 AM, 03/18/24 Tanji Storrs Small Roberta Kelly MPT [facility] [place] [phone]

## 2024-03-19 ENCOUNTER — Other Ambulatory Visit: Payer: Self-pay

## 2024-03-19 ENCOUNTER — Ambulatory Visit (HOSPITAL_COMMUNITY)

## 2024-03-19 DIAGNOSIS — M5442 Lumbago with sciatica, left side: Secondary | ICD-10-CM | POA: Insufficient documentation

## 2024-03-19 DIAGNOSIS — M5416 Radiculopathy, lumbar region: Secondary | ICD-10-CM | POA: Diagnosis present

## 2024-03-19 DIAGNOSIS — M7918 Myalgia, other site: Secondary | ICD-10-CM | POA: Insufficient documentation

## 2024-03-19 DIAGNOSIS — M542 Cervicalgia: Secondary | ICD-10-CM | POA: Diagnosis present

## 2024-03-19 DIAGNOSIS — M797 Fibromyalgia: Secondary | ICD-10-CM | POA: Insufficient documentation

## 2024-03-19 DIAGNOSIS — G8929 Other chronic pain: Secondary | ICD-10-CM | POA: Diagnosis present

## 2024-03-19 DIAGNOSIS — M5441 Lumbago with sciatica, right side: Secondary | ICD-10-CM | POA: Insufficient documentation

## 2024-03-24 ENCOUNTER — Ambulatory Visit (HOSPITAL_COMMUNITY)

## 2024-03-24 ENCOUNTER — Encounter (HOSPITAL_COMMUNITY): Payer: Self-pay

## 2024-03-24 DIAGNOSIS — M5416 Radiculopathy, lumbar region: Secondary | ICD-10-CM

## 2024-03-24 DIAGNOSIS — M542 Cervicalgia: Secondary | ICD-10-CM | POA: Diagnosis not present

## 2024-03-24 DIAGNOSIS — M7918 Myalgia, other site: Secondary | ICD-10-CM

## 2024-03-24 DIAGNOSIS — G8929 Other chronic pain: Secondary | ICD-10-CM

## 2024-03-24 NOTE — Therapy (Signed)
 OUTPATIENT PHYSICAL THERAPY  CERVICAL AND THORACOLUMBAR TREATMENT   Patient Name: Lindsay Walsh MRN: [id number] DOB:Sep 16, 1970, 53 y.o., female Today's Date: 03/24/2024  END OF SESSION:  PT End of Session - 03/24/24 1303     Visit Number 2    Number of Visits 8    Date for PT Re-Evaluation 04/16/24    Authorization Type [employer]; dual complete    Authorization Time Period No auth required    Progress Note Due on Visit 10    PT Start Time 1304    PT Stop Time 1346    PT Time Calculation (min) 42 min    Activity Tolerance Patient tolerated treatment well    Behavior During Therapy WFL for tasks assessed/performed          Past Medical History:  Diagnosis Date   Acid reflux    Allergic rhinitis due to pollen    Anxiety    Arthritis    back   Autoimmune hepatitis (HCC)    Back pain    Connective tissue disease (HCC)    Depression    Dizziness and giddiness    Dyspareunia 05/06/2014   Dysrhythmia    palpatations; on propafenone   Essential (primary) hypertension    Fibromyalgia    Gastroesophageal reflux disease    Chronic abdominal pain; gastroparesis; globus hystericus; irritable bowel syndrome   Glaucoma    Hereditary and idiopathic neuropathy, unspecified    Hyperlipidemia    Irritable bowel syndrome    Major depressive disorder, single episode, unspecified    Morbid (severe) obesity due to excess calories (HCC)    Narcotic dependence (HCC)    Nonspecific mesenteric lymphadenitis    Overweight(278.02)    Pain in limb    Pain in right foot    PTSD (post-traumatic stress disorder)    Pulmonary nodule    Restless legs syndrome    Sleep apnea    on CPAP machine   Stroke (HCC) 2016   Systemic lupus erythematosus (SLE) in adult [facility])    Tobacco abuse    one pack per day; 35 pack years; quit in February 2023   Type 2 diabetes mellitus with hyperglycemia (HCC)    Past Surgical History:  Procedure Laterality Date   ABDOMINAL HYSTERECTOMY      TAH&BSO   BACK SURGERY     BLADDER SUSPENSION  09/12/2011   Procedure: TRANSVAGINAL TAPE (TVT) PROCEDURE;  Surgeon: Emery LILLETTE Blaze, MD;  Location: [facility];  Service: Urology;  Laterality: N/A;   CESAREAN SECTION     X3   CHOLECYSTECTOMY     CORONARY PRESSURE/FFR STUDY N/A 04/21/2022   Procedure: INTRAVASCULAR PRESSURE WIRE/FFR STUDY;  Surgeon: Anner Alm ORN, MD;  Location: [facility] INVASIVE CV LAB;  Service: Cardiovascular;  Laterality: N/A;   ESOPHAGOGASTRODUODENOSCOPY ENDOSCOPY     throat stretched per pt.   LEFT HEART CATH AND CORONARY ANGIOGRAPHY N/A 04/21/2022   Procedure: LEFT HEART CATH AND CORONARY ANGIOGRAPHY;  Surgeon: Anner Alm ORN, MD;  Location: [facility] INVASIVE CV LAB;  Service: Cardiovascular;  Laterality: N/A;   LIVER BIOPSY     x4   LUMBAR EPIDURAL INJECTION  01/2012   TOTAL ABDOMINAL HYSTERECTOMY W/ BILATERAL SALPINGOOPHORECTOMY     TUBAL LIGATION     Bilateral   UPPER GASTROINTESTINAL ENDOSCOPY  09/13/2010   Patient Active Problem List   Diagnosis Date Noted   Abdominal bloating 10/10/2023   Pain in right knee 03/28/2023   Diabetes mellitus without complication (HCC) 03/27/2023  Liver fibrosis 03/02/2023   Pain in left shoulder 09/25/2022   Progressive angina (HCC)    Coronary artery calcification seen on CT scan    History of nuclear stress test    Abnormal nuclear stress test 04/11/2022   Atypical angina (HCC) 04/11/2022   Bilateral carpal tunnel syndrome 04/05/2022   Positive ANA (antinuclear antibody) 11/16/2021   History of colonic polyps 11/15/2021   Bilateral hand pain 06/02/2020   OAB (overactive bladder) 11/05/2019   Recurrent UTI 11/05/2019   Diabetic gastroparesis (HCC) 08/21/2019   Diarrhea 07/15/2019   Abnormal finding on urinalysis 07/15/2019   Morbid (severe) obesity due to excess calories (HCC)    Major depressive disorder, single episode, unspecified    Restless legs syndrome    Sleep apnea    Fibromyalgia    Hereditary and  idiopathic neuropathy, unspecified    Nonspecific mesenteric lymphadenitis    Pain in right foot    Type 2 diabetes mellitus with hyperglycemia (HCC)    Hypothyroidism 08/22/2018   DM type 2 causing vascular disease (HCC) 08/21/2018   Hyperlipidemia associated with type 2 diabetes mellitus (HCC) 08/21/2018   Essential hypertension, benign 08/21/2018   Influenza A 08/10/2018   SIRS (systemic inflammatory response syndrome) (HCC) 08/10/2018   Cerebral infarction (HCC) 03/05/2015   Dyspareunia 05/06/2014   Obesity 09/30/2013   Bilateral hip pain 07/16/2013   Chronic pain syndrome 07/16/2013   Degenerative disc disease, lumbar 07/16/2013   Greater trochanteric bursitis of both hips 07/16/2013   Nightmares associated with chronic post-traumatic stress disorder 10/18/2012   Sprain of foot 03/14/2012   Weakness 01/29/2012   Irritable bowel syndrome with diarrhea 08/01/2011   Gastroesophageal reflux disease    Depression    Chest pain    Fasting hyperglycemia    TOBACCO ABUSE 04/28/2010   NARCOTIC ABUSE 04/28/2010   PULMONARY NODULE 04/28/2010   Autoimmune hepatitis (HCC) 04/28/2010   Myalgia and myositis 01/06/2009    PCP: Kahoano, Haku K, MD  REFERRING PROVIDER: Trudy Duwaine BRAVO, NP  REFERRING DIAG: M54.2 (ICD-10-CM) - Cervicalgia M54.42,M54.41,G89.29 (ICD-10-CM) - Chronic bilateral low back pain with bilateral sciatica M54.16 (ICD-10-CM) - Lumbar radiculopathy M79.18 (ICD-10-CM) - Myofascial pain syndrome M79.7 (ICD-10-CM) - FibromyalgiaM54.2 (ICD-10-CM)   Rationale for Evaluation and Treatment: Rehabilitation  THERAPY DIAG:  Cervicalgia  Chronic bilateral low back pain with bilateral sciatica  Lumbar radiculopathy  Myofascial pain syndrome  ONSET DATE: chronic; for years as long as I can remember  SUBJECTIVE:  SUBJECTIVE STATEMENT: Pt reports increased pain today in neck and low back, 5/10 in both. HEP went well. No questions and concerns.    EVAL: Chronic neck and back pain.  Has Been treated at [facility] pain management and transferred her pain management care to PCP.  She has had injections in her back and had the nerve burned but neither helped for very long.  Has an appointment for neck injections coming up in 2 weeks.  Referred here to therapy. Here previously for knee pain  PERTINENT HISTORY:  DM  CPT Chronic knee pain  PAIN:  Are you having pain? Yes: NPRS scale: 5/10 neck and back after pain med Pain location: neck and back Pain description: back sore and neck aching and throbbing Aggravating factors: sit or stand or walk too long Relieving factors: change positions, pain meds, stretching; chiropractor  PRECAUTIONS: None     WEIGHT BEARING RESTRICTIONS: No  FALLS:  Has patient fallen in last 6 months? Yes. Number of falls 1; got dizzy  OCCUPATION: disabled  PLOF: Independent  PATIENT GOALS: learn some exercises to ease my pain  NEXT MD VISIT: 04/03/24 for neck injections  OBJECTIVE:  Note: Objective measures were completed at Evaluation unless otherwise noted.  DIAGNOSTIC FINDINGS:  IMPRESSION: 1. Severe facet arthropathy on the left at C3-4 with edema in the articular processes. Severe left facet arthropathy at C4-5 and moderate left facet arthropathy at C5-6  IMPRESSION: 1. Dominant degenerative finding is L4-L5 facet arthropathy, superimposed on subtle grade 1 Anterolisthesis there with some evidence of mild acute exacerbation of the chronic facet joint arthritis. No associated spinal stenosis or convincing neural impingement. But facet disease can be a source of low back pain which sometimes radiates.   2. Small central disc herniation at L5-S1 without convincing stenosis. Mild to moderate facet degeneration there and also  at L3-L4.      PATIENT SURVEYS:  NDI:  NECK DISABILITY INDEX  Date: 03/19/24 Score                                Total 24/50; 48%   Minimum Detectable Change (90% confidence): 5 points or 10% points  COGNITION: Overall cognitive status: Within functional limits for tasks assessed     SENSATION: WFL    POSTURE: rounded shoulders and forward head  PALPATION: Tightness bilateral upper traps  CERVICAL ROM: *painful  Active ROM AROM (deg) eval  Flexion 33  Extension 36*  Right lateral flexion 29  Left lateral flexion 33  Right rotation 48*  Left rotation 44*   (Blank rows = not tested)  LUMBAR ROM:   AROM eval  Flexion Fingertips to 1 past knee cap  Extension 20% available; gets dizzy  Right lateral flexion   Left lateral flexion   Right rotation   Left rotation    (Blank rows = not tested)  LOWER EXTREMITY ROM:     Active  Right eval Left eval  Hip flexion    Hip extension    Hip abduction    Hip adduction    Hip internal rotation    Hip external rotation    Knee flexion    Knee extension    Ankle dorsiflexion    Ankle plantarflexion    Ankle inversion    Ankle eversion     (Blank rows = not tested)  LOWER EXTREMITY MMT:    MMT Right eval Left eval  Hip flexion 4+  4+  Hip extension    Hip abduction    Hip adduction    Hip internal rotation    Hip external rotation    Knee flexion    Knee extension 5 4  Ankle dorsiflexion 5 5  Ankle plantarflexion    Ankle inversion    Ankle eversion     (Blank rows = not tested)  FUNCTIONAL TESTS:  5 times sit to stand: 22.34 knees popping  GAIT: Distance walked: 50 ft in clinic Assistive device utilized: None Level of assistance: Modified independence Comments: slight decreased speed and antalgic gait  TREATMENT DATE:  03/24/24: Review of goals and HEP MHP donned to mid back and neck while in supine Decompression exercises, 1-5, one round of each Lower Trunk Rotation, 10  each side SKTC, 10x each LE, pt reports feeling a stretch Hamstring length: 135 deg bilaterally Hamstring stretch, 30x2 Supine cervical rotation, x10 each way  Upper Trap stretch, 30x2    03/19/24 physical therapy evaluation and HEP instruction                                                                                                                                PATIENT EDUCATION:  Education details: Patient educated on exam findings, POC, scope of PT, HEP, and benefit of aquatic exercise. Person educated: Patient Education method: Explanation, Demonstration, and Handouts Education comprehension: verbalized understanding, returned demonstration, verbal cues required, and tactile cues required  HOME EXERCISE PROGRAM: Decompression exercises 1-5  Access Code: [email or web address] URL: [email or web address] Date: 03/24/2024 Prepared by:   Exercises - Seated Cervical Sidebending Stretch  - 2 x daily - 7 x weekly - 1 sets - 3 reps  03/24/24 - Supine Lower Trunk Rotation  - 2 x daily - 7 x weekly - 2 sets - 10 reps - Hooklying Hamstring Stretch with Strap  - 2 x daily - 7 x weekly - 3 sets - 30 hold - Supine Cervical Rotation AROM on Pillow  - 2 x daily - 7 x weekly - 2 sets - 10 reps   ASSESSMENT:  CLINICAL IMPRESSION: Session began with review of goals. Patient reports understanding and agreeable. Patient reports feeling moderate pain in both low back and cervical spine this date. Moist heat pack donned during supine activities. Began with review of HEP. Patient demonstrates good carryover of decompression exercises. Followed with general mobility activities for low back for any stiffness. Patient reports feeling a pull in low back and down both LE during [facility]. Hamstring tightness assessed. ~135 degrees on both sides, mild-moderate tightness. Added to HEP. Ended session with cervical mobility. Patient slow and careful throughout. Patient reports decreased pain, 3/10 at end  of session. Patient will benefit from continued skilled physical therapy in order to address current level of mobility, and pain to improve overall function and quality of life.      EVAL: Patient is a 53 y.o. female who was seen today for physical therapy  evaluation and treatment for M54.2 (ICD-10-CM) - Cervicalgia M54.42,M54.41,G89.29 (ICD-10-CM) - Chronic bilateral low back pain with bilateral sciatica M54.16 (ICD-10-CM) - Lumbar radiculopathy M79.18 (ICD-10-CM) - Myofascial pain syndrome M79.7 (ICD-10-CM) - Fibromyalgia. Patient demonstrates muscle weakness, reduced ROM, and fascial restrictions which are likely contributing to symptoms of pain and are negatively impacting patient ability to perform ADLs and functional mobility tasks. Patient will benefit from skilled physical therapy services to address these deficits to reduce pain and improve level of function with ADLs and functional mobility tasks.  OBJECTIVE IMPAIRMENTS: Abnormal gait, decreased activity tolerance, decreased mobility, difficulty walking, decreased ROM, and decreased strength.   ACTIVITY LIMITATIONS: carrying, lifting, bending, sitting, and standing  PARTICIPATION LIMITATIONS: meal prep, cleaning, shopping, and community activity   REHAB POTENTIAL: Good  CLINICAL DECISION MAKING: Evolving/moderate complexity  EVALUATION COMPLEXITY: Moderate   GOALS: Goals reviewed with patient? Yes  SHORT TERM GOALS: Target date: 04/02/2024  patient will be independent with initial HEP  Baseline: Goal status: INITIAL  2.  Patient will report 30% improvement overall  Baseline:  Goal status: INITIAL  LONG TERM GOALS: Target date: 04/16/2024  Patient will be independent in self management strategies to improve quality of life and functional outcomes.  Baseline:  Goal status: INITIAL  2.  Patient will report 50% improvement overall  Baseline:  Goal status: INITIAL  3.  Patient will improve NDI score 5 points,   19/50 or less to demonstrate improved perceived function  Baseline: 24/50 Goal status: INITIAL  4.  Patient will increase cervical mobility by 20 degrees throughout to improve ability to scan for safety with driving  Baseline: see above Goal status: INITIAL  5.  Patient will increase lumbar mobility flexion to fingertips to mid shin to improve ability to bend over to pick up items In her household Baseline:  Goal status: INITIAL  6.  Patient will improve 5 times sit to stand score to 15 sec or less to demonstrate improved functional mobility and increased leg strength.    Baseline: 22.34 sec Goal status: INITIAL  PLAN:  PT FREQUENCY: 2x/week  PT DURATION: 4 weeks  PLANNED INTERVENTIONS: [id number]- PT Re-evaluation, 97110-Therapeutic exercises, 97530- Therapeutic activity, 97112- Neuromuscular re-education, 97535- Self Care, [id number]- Manual therapy, [id number]- Gait training, [phone]- Orthotic Fit/training, [phone]- Canalith repositioning, [id number]- Aquatic Therapy, [phone]- Splinting, [phone]- Wound care (first 20 sq cm), 97598- Wound care (each additional 20 sq cm)Patient/Family education, Balance training, Stair training, Taping, Dry Needling, Joint mobilization, Joint manipulation, Spinal manipulation, Spinal mobilization, Scar mobilization, and DME instructions.  SABRA  PLAN FOR NEXT SESSION: try manual cervical traction and if beneficial consider ordering home unit.  Postural strengthening and spinal mobility   2:32 PM, 03/24/24 Freddie Nghiem Powell-Butler, PT, DPT [facility]

## 2024-03-25 ENCOUNTER — Encounter: Payer: Self-pay | Admitting: Podiatry

## 2024-03-25 ENCOUNTER — Ambulatory Visit (INDEPENDENT_AMBULATORY_CARE_PROVIDER_SITE_OTHER): Payer: 59 | Admitting: Podiatry

## 2024-03-25 DIAGNOSIS — E119 Type 2 diabetes mellitus without complications: Secondary | ICD-10-CM

## 2024-03-25 DIAGNOSIS — L600 Ingrowing nail: Secondary | ICD-10-CM

## 2024-03-25 NOTE — Progress Notes (Signed)
 This patient presents to the office for diabetic foot exam. This patient says there is no pain or discomfort in her big toes both feet.  She says the big toes become painful walking and wearing her shoes.  She says she has dug out the ingrown nail but the nail keeps regrowing.    This patient presents to the office for foot exam due to having a history of diabetes.  Vascular  Dorsalis pedis and posterior tibial pulses are palpable  B/L.  Capillary return  WNL.  Temperature gradient is  WNL.  Skin turgor  WNL  Sensorium  Senn Weinstein monofilament wire  WNL. Normal tactile sensation.  Nail Exam  Patient has normal nails with no evidence of bacterial or fungal infection. Incurvation medial borders great toenail  B/L.  Orthopedic  Exam  Muscle tone and muscle strength  WNL.  No limitations of motion feet  B/L.  No crepitus or joint effusion noted.  Foot type is unremarkable and digits show no abnormalities. Tailors bunion 5th MPJ right foot.  Skin  No open lesions.  Normal skin texture and turgor.   Diabetes with no complications  Diabetic foot exam was performed.  There is no evidence of vascular or neurologic pathology.  Patient to make an appointment with Dr.  Tobie for nail surgery. RTC  1 year.   [name] DPM

## 2024-03-26 ENCOUNTER — Encounter (HOSPITAL_COMMUNITY)

## 2024-04-02 ENCOUNTER — Ambulatory Visit: Admitting: Podiatry

## 2024-04-03 ENCOUNTER — Encounter: Admitting: Physical Medicine and Rehabilitation

## 2024-04-04 ENCOUNTER — Other Ambulatory Visit (HOSPITAL_COMMUNITY): Payer: Self-pay | Admitting: Adult Medicine

## 2024-04-04 ENCOUNTER — Encounter (HOSPITAL_COMMUNITY)

## 2024-04-04 DIAGNOSIS — Z1231 Encounter for screening mammogram for malignant neoplasm of breast: Secondary | ICD-10-CM

## 2024-04-08 ENCOUNTER — Encounter (HOSPITAL_COMMUNITY)

## 2024-04-09 ENCOUNTER — Ambulatory Visit (HOSPITAL_COMMUNITY)
Admission: RE | Admit: 2024-04-09 | Discharge: 2024-04-09 | Disposition: A | Discharge status: Home / Self Care | Source: Ambulatory Visit | Attending: Adult Medicine | Admitting: Adult Medicine

## 2024-04-09 DIAGNOSIS — Z1231 Encounter for screening mammogram for malignant neoplasm of breast: Secondary | ICD-10-CM | POA: Insufficient documentation

## 2024-04-10 ENCOUNTER — Ambulatory Visit (HOSPITAL_COMMUNITY): Admitting: Physical Therapy

## 2024-04-10 DIAGNOSIS — G8929 Other chronic pain: Secondary | ICD-10-CM | POA: Diagnosis present

## 2024-04-10 DIAGNOSIS — M5442 Lumbago with sciatica, left side: Secondary | ICD-10-CM | POA: Insufficient documentation

## 2024-04-10 DIAGNOSIS — M542 Cervicalgia: Secondary | ICD-10-CM | POA: Diagnosis present

## 2024-04-10 DIAGNOSIS — M5441 Lumbago with sciatica, right side: Secondary | ICD-10-CM | POA: Diagnosis present

## 2024-04-10 DIAGNOSIS — M5416 Radiculopathy, lumbar region: Secondary | ICD-10-CM | POA: Insufficient documentation

## 2024-04-10 NOTE — Therapy (Signed)
 OUTPATIENT PHYSICAL THERAPY  CERVICAL AND THORACOLUMBAR TREATMENT   Patient Name: Lindsay Walsh MRN: [id number] DOB:10/13/1970, 53 y.o., female Today's Date: 04/10/2024  END OF SESSION:  PT End of Session - 04/10/24 1340     Visit Number 3    Number of Visits 8    Date for Recertification  04/16/24    Authorization Type [employer]; dual complete    Authorization Time Period No auth required    Progress Note Due on Visit 10    PT Start Time 1336    PT Stop Time 1415    PT Time Calculation (min) 39 min    Activity Tolerance Patient tolerated treatment well    Behavior During Therapy WFL for tasks assessed/performed          Past Medical History:  Diagnosis Date   Acid reflux    Allergic rhinitis due to pollen    Anxiety    Arthritis    back   Autoimmune hepatitis (HCC)    Back pain    Connective tissue disease    Depression    Dizziness and giddiness    Dyspareunia 05/06/2014   Dysrhythmia    palpatations; on propafenone   Essential (primary) hypertension    Fibromyalgia    Gastroesophageal reflux disease    Chronic abdominal pain; gastroparesis; globus hystericus; irritable bowel syndrome   Glaucoma    Hereditary and idiopathic neuropathy, unspecified    Hyperlipidemia    Irritable bowel syndrome    Major depressive disorder, single episode, unspecified    Morbid (severe) obesity due to excess calories (HCC)    Narcotic dependence (HCC)    Nonspecific mesenteric lymphadenitis    Overweight(278.02)    Pain in limb    Pain in right foot    PTSD (post-traumatic stress disorder)    Pulmonary nodule    Restless legs syndrome    Sleep apnea    on CPAP machine   Stroke (HCC) 2016   Systemic lupus erythematosus (SLE) in adult [facility])    Tobacco abuse    one pack per day; 35 pack years; quit in February 2023   Type 2 diabetes mellitus with hyperglycemia (HCC)    Past Surgical History:  Procedure Laterality Date   ABDOMINAL HYSTERECTOMY      TAH&BSO   BACK SURGERY     BLADDER SUSPENSION  09/12/2011   Procedure: TRANSVAGINAL TAPE (TVT) PROCEDURE;  Surgeon: Emery LILLETTE Blaze, MD;  Location: [facility];  Service: Urology;  Laterality: N/A;   CESAREAN SECTION     X3   CHOLECYSTECTOMY     CORONARY PRESSURE/FFR STUDY N/A 04/21/2022   Procedure: INTRAVASCULAR PRESSURE WIRE/FFR STUDY;  Surgeon: Anner Alm ORN, MD;  Location: [facility] INVASIVE CV LAB;  Service: Cardiovascular;  Laterality: N/A;   ESOPHAGOGASTRODUODENOSCOPY ENDOSCOPY     throat stretched per pt.   LEFT HEART CATH AND CORONARY ANGIOGRAPHY N/A 04/21/2022   Procedure: LEFT HEART CATH AND CORONARY ANGIOGRAPHY;  Surgeon: Anner Alm ORN, MD;  Location: [facility] INVASIVE CV LAB;  Service: Cardiovascular;  Laterality: N/A;   LIVER BIOPSY     x4   LUMBAR EPIDURAL INJECTION  01/2012   TOTAL ABDOMINAL HYSTERECTOMY W/ BILATERAL SALPINGOOPHORECTOMY     TUBAL LIGATION     Bilateral   UPPER GASTROINTESTINAL ENDOSCOPY  09/13/2010   Patient Active Problem List   Diagnosis Date Noted   Abdominal bloating 10/10/2023   Pain in right knee 03/28/2023   Diabetes mellitus without complication (HCC) 03/27/2023  Liver fibrosis 03/02/2023   Pain in left shoulder 09/25/2022   Progressive angina (HCC)    Coronary artery calcification seen on CT scan    History of nuclear stress test    Abnormal nuclear stress test 04/11/2022   Atypical angina 04/11/2022   Bilateral carpal tunnel syndrome 04/05/2022   Positive ANA (antinuclear antibody) 11/16/2021   History of colonic polyps 11/15/2021   Bilateral hand pain 06/02/2020   OAB (overactive bladder) 11/05/2019   Recurrent UTI 11/05/2019   Diabetic gastroparesis (HCC) 08/21/2019   Diarrhea 07/15/2019   Abnormal finding on urinalysis 07/15/2019   Morbid (severe) obesity due to excess calories (HCC)    Major depressive disorder, single episode, unspecified    Restless legs syndrome    Sleep apnea    Fibromyalgia    Hereditary and idiopathic  neuropathy, unspecified    Nonspecific mesenteric lymphadenitis    Pain in right foot    Type 2 diabetes mellitus with hyperglycemia (HCC)    Hypothyroidism 08/22/2018   DM type 2 causing vascular disease (HCC) 08/21/2018   Hyperlipidemia associated with type 2 diabetes mellitus (HCC) 08/21/2018   Essential hypertension, benign 08/21/2018   Influenza A 08/10/2018   SIRS (systemic inflammatory response syndrome) (HCC) 08/10/2018   Cerebral infarction (HCC) 03/05/2015   Dyspareunia 05/06/2014   Obesity 09/30/2013   Bilateral hip pain 07/16/2013   Chronic pain syndrome 07/16/2013   Degenerative disc disease, lumbar 07/16/2013   Greater trochanteric bursitis of both hips 07/16/2013   Nightmares associated with chronic post-traumatic stress disorder 10/18/2012   Sprain of foot 03/14/2012   Weakness 01/29/2012   Irritable bowel syndrome with diarrhea 08/01/2011   Gastroesophageal reflux disease    Depression    Chest pain    Fasting hyperglycemia    TOBACCO ABUSE 04/28/2010   NARCOTIC ABUSE 04/28/2010   PULMONARY NODULE 04/28/2010   Autoimmune hepatitis (HCC) 04/28/2010   Myalgia and myositis 01/06/2009    PCP: Kahoano, Haku K, MD  REFERRING PROVIDER: Trudy Duwaine BRAVO, NP  REFERRING DIAG: M54.2 (ICD-10-CM) - Cervicalgia M54.42,M54.41,G89.29 (ICD-10-CM) - Chronic bilateral low back pain with bilateral sciatica M54.16 (ICD-10-CM) - Lumbar radiculopathy M79.18 (ICD-10-CM) - Myofascial pain syndrome M79.7 (ICD-10-CM) - FibromyalgiaM54.2 (ICD-10-CM)   Rationale for Evaluation and Treatment: Rehabilitation  THERAPY DIAG:  Cervicalgia  Chronic bilateral low back pain with bilateral sciatica  Lumbar radiculopathy  ONSET DATE: chronic; for years as long as I can remember  SUBJECTIVE:  SUBJECTIVE  STATEMENT: Pt reports pain today is the same at 5/10 in both neck and back. HEP going well.    EVAL: Chronic neck and back pain.  Has Been treated at [facility] pain management and transferred her pain management care to PCP.  She has had injections in her back and had the nerve burned but neither helped for very long.  Has an appointment for neck injections coming up in 2 weeks.  Referred here to therapy. Here previously for knee pain  PERTINENT HISTORY:  DM  CPT Chronic knee pain  PAIN:  Are you having pain? Yes: NPRS scale: 5/10 neck and back after pain med Pain location: neck and back Pain description: back sore and neck aching and throbbing Aggravating factors: sit or stand or walk too long Relieving factors: change positions, pain meds, stretching; chiropractor  PRECAUTIONS: None     WEIGHT BEARING RESTRICTIONS: No  FALLS:  Has patient fallen in last 6 months? Yes. Number of falls 1; got dizzy  OCCUPATION: disabled  PLOF: Independent  PATIENT GOALS: learn some exercises to ease my pain  NEXT MD VISIT: 04/03/24 for neck injections  OBJECTIVE:  Note: Objective measures were completed at Evaluation unless otherwise noted.  DIAGNOSTIC FINDINGS:  IMPRESSION: 1. Severe facet arthropathy on the left at C3-4 with edema in the articular processes. Severe left facet arthropathy at C4-5 and moderate left facet arthropathy at C5-6  IMPRESSION: 1. Dominant degenerative finding is L4-L5 facet arthropathy, superimposed on subtle grade 1 Anterolisthesis there with some evidence of mild acute exacerbation of the chronic facet joint arthritis. No associated spinal stenosis or convincing neural impingement. But facet disease can be a source of low back pain which sometimes radiates.   2. Small central disc herniation at L5-S1 without convincing stenosis. Mild to moderate facet degeneration there and also at L3-L4.      PATIENT SURVEYS:  NDI:  NECK DISABILITY INDEX  Date:  03/19/24 Score  Total 24/50; 48%   Minimum Detectable Change (90% confidence): 5 points or 10% points  COGNITION: Overall cognitive status: Within functional limits for tasks assessed     SENSATION: WFL    POSTURE: rounded shoulders and forward head  PALPATION: Tightness bilateral upper traps  CERVICAL ROM: *painful  Active ROM AROM (deg) eval  Flexion 33  Extension 36*  Right lateral flexion 29  Left lateral flexion 33  Right rotation 48*  Left rotation 44*   (Blank rows = not tested)  LUMBAR ROM:   AROM eval  Flexion Fingertips to 1 past knee cap  Extension 20% available; gets dizzy  Right lateral flexion   Left lateral flexion   Right rotation   Left rotation    (Blank rows = not tested)   LOWER EXTREMITY MMT:    MMT Right eval Left eval  Hip flexion 4+ 4+  Hip extension    Hip abduction    Hip adduction    Hip internal rotation    Hip external rotation    Knee flexion    Knee extension 5 4  Ankle dorsiflexion 5 5  Ankle plantarflexion    Ankle inversion    Ankle eversion     (Blank rows = not tested)  FUNCTIONAL TESTS:  5 times sit to stand: 22.34 knees popping  GAIT: Distance walked: 50 ft in clinic Assistive device utilized: None Level of assistance: Modified independence Comments: slight decreased speed and antalgic gait  TREATMENT DATE:  04/10/24 MHP donned to mid back and neck while  in supine Decompression exercises, 10X5 each Lower Trunk Rotation, 10 each side, 5 holds Abdominal bracing 10X5 Bridge 10X 3 SLR 10X each with core stabilization cervical rotation, x10 each way   03/24/24: Review of goals and HEP MHP donned to mid back and neck while in supine Decompression exercises, 1-5, one round of each Lower Trunk Rotation, 10 each side SKTC, 10x each LE, pt reports feeling a stretch Hamstring length: 135 deg bilaterally Hamstring stretch, 30x2 Supine cervical rotation, x10 each way  Upper Trap stretch,  30x2    03/19/24 physical therapy evaluation and HEP instruction                                                                                                                                PATIENT EDUCATION:  Education details: Patient educated on exam findings, POC, scope of PT, HEP, and benefit of aquatic exercise. Person educated: Patient Education method: Explanation, Demonstration, and Handouts Education comprehension: verbalized understanding, returned demonstration, verbal cues required, and tactile cues required  HOME EXERCISE PROGRAM: Decompression exercises 1-5  Access Code: [email or web address] URL: [email or web address] Date: 03/24/2024 Prepared by:   Exercises - Seated Cervical Sidebending Stretch  - 2 x daily - 7 x weekly - 1 sets - 3 reps  03/24/24 - Supine Lower Trunk Rotation  - 2 x daily - 7 x weekly - 2 sets - 10 reps - Hooklying Hamstring Stretch with Strap  - 2 x daily - 7 x weekly - 3 sets - 30 hold - Supine Cervical Rotation AROM on Pillow  - 2 x daily - 7 x weekly - 2 sets - 10 reps  Access Code: [email or web address] URL: [email or web address] Date: 04/10/2024 - Supine Transversus Abdominis Bracing - Hands on Stomach  - 2 x daily - 7 x weekly - 2 sets - 10 reps - Beginner Bridge  - 2 x daily - 7 x weekly - 2 sets - 10 reps - Supine Pelvic Tilt with Straight Leg Raise  - 2 x daily - 7 x weekly - 2 sets - 10 reps  ASSESSMENT:  CLINICAL IMPRESSION: Pt reported positive results with moist heat use with therex.  Continued this in supine increasing reps of decompression exercises.   Educated on importance of engaging core musculature with ability to complete isometric contractions with verbal cues for breathing/technique.  Added bridge and SLR today also as noted hip flexor and glut weakness. Patient  completed all exercises slowly with adequate holds and  controlled movements. New exercises added to HEP today.  Patient reports decreased pain, 3/10 at  end of session. Patient will benefit from continued skilled physical therapy in order to address current level of mobility, and pain to improve overall function and quality of life.    EVAL: Patient is a 53 y.o. female who was seen today for physical therapy evaluation and treatment for M54.2 (ICD-10-CM) - Cervicalgia M54.42,M54.41,G89.29 (ICD-10-CM) - Chronic bilateral low back pain with bilateral sciatica M54.16 (  ICD-10-CM) - Lumbar radiculopathy M79.18 (ICD-10-CM) - Myofascial pain syndrome M79.7 (ICD-10-CM) - Fibromyalgia. Patient demonstrates muscle weakness, reduced ROM, and fascial restrictions which are likely contributing to symptoms of pain and are negatively impacting patient ability to perform ADLs and functional mobility tasks. Patient will benefit from skilled physical therapy services to address these deficits to reduce pain and improve level of function with ADLs and functional mobility tasks.  OBJECTIVE IMPAIRMENTS: Abnormal gait, decreased activity tolerance, decreased mobility, difficulty walking, decreased ROM, and decreased strength.   ACTIVITY LIMITATIONS: carrying, lifting, bending, sitting, and standing  PARTICIPATION LIMITATIONS: meal prep, cleaning, shopping, and community activity   REHAB POTENTIAL: Good  CLINICAL DECISION MAKING: Evolving/moderate complexity  EVALUATION COMPLEXITY: Moderate   GOALS: Goals reviewed with patient? Yes  SHORT TERM GOALS: Target date: 04/02/2024  patient will be independent with initial HEP  Baseline: Goal status: INITIAL  2.  Patient will report 30% improvement overall  Baseline:  Goal status: INITIAL  LONG TERM GOALS: Target date: 04/16/2024  Patient will be independent in self management strategies to improve quality of life and functional outcomes.  Baseline:  Goal status: INITIAL  2.  Patient will report 50% improvement overall  Baseline:  Goal status: INITIAL  3.  Patient will improve NDI score 5 points,   19/50 or less to demonstrate improved perceived function  Baseline: 24/50 Goal status: INITIAL  4.  Patient will increase cervical mobility by 20 degrees throughout to improve ability to scan for safety with driving  Baseline: see above Goal status: INITIAL  5.  Patient will increase lumbar mobility flexion to fingertips to mid shin to improve ability to bend over to pick up items In her household Baseline:  Goal status: INITIAL  6.  Patient will improve 5 times sit to stand score to 15 sec or less to demonstrate improved functional mobility and increased leg strength.    Baseline: 22.34 sec Goal status: INITIAL  PLAN:  PT FREQUENCY: 2x/week  PT DURATION: 4 weeks  PLANNED INTERVENTIONS: [id number]- PT Re-evaluation, 97110-Therapeutic exercises, 97530- Therapeutic activity, 97112- Neuromuscular re-education, 97535- Self Care, [id number]- Manual therapy, [id number]- Gait training, [phone]- Orthotic Fit/training, [phone]- Canalith repositioning, [id number]- Aquatic Therapy, [phone]- Splinting, [phone]- Wound care (first 20 sq cm), 97598- Wound care (each additional 20 sq cm)Patient/Family education, Balance training, Stair training, Taping, Dry Needling, Joint mobilization, Joint manipulation, Spinal manipulation, Spinal mobilization, Scar mobilization, and DME instructions.  SABRA  PLAN FOR NEXT SESSION: continue to progress postural strengthening and spinal mobility.  Next session add tband decompression exercises.    Greig KATHEE Fuse, PTA/CLT [facility] Ph: [phone] 1:40 PM, 04/10/24

## 2024-04-11 ENCOUNTER — Other Ambulatory Visit (HOSPITAL_COMMUNITY): Payer: Self-pay | Admitting: Psychiatry

## 2024-04-11 ENCOUNTER — Telehealth (INDEPENDENT_AMBULATORY_CARE_PROVIDER_SITE_OTHER): Admitting: Psychiatry

## 2024-04-11 ENCOUNTER — Encounter (HOSPITAL_COMMUNITY): Payer: Self-pay | Admitting: Psychiatry

## 2024-04-11 DIAGNOSIS — F900 Attention-deficit hyperactivity disorder, predominantly inattentive type: Secondary | ICD-10-CM

## 2024-04-11 DIAGNOSIS — F419 Anxiety disorder, unspecified: Secondary | ICD-10-CM | POA: Diagnosis not present

## 2024-04-11 DIAGNOSIS — F331 Major depressive disorder, recurrent, moderate: Secondary | ICD-10-CM

## 2024-04-11 DIAGNOSIS — F515 Nightmare disorder: Secondary | ICD-10-CM

## 2024-04-11 DIAGNOSIS — F4312 Post-traumatic stress disorder, chronic: Secondary | ICD-10-CM

## 2024-04-11 MED ORDER — PRAZOSIN HCL 2 MG PO CAPS: ORAL_CAPSULE | ORAL | 2 refills | Status: DC

## 2024-04-11 MED ORDER — AMPHETAMINE-DEXTROAMPHETAMINE 20 MG PO TABS: 20.0000 mg | ORAL_TABLET | Freq: Two times a day (BID) | ORAL | 0 refills | Status: DC

## 2024-04-11 MED ORDER — ARIPIPRAZOLE 2 MG PO TABS: 2.0000 mg | ORAL_TABLET | Freq: Every day | ORAL | 2 refills | Status: DC

## 2024-04-11 MED ORDER — PAROXETINE HCL 40 MG PO TABS: 40.0000 mg | ORAL_TABLET | Freq: Every day | ORAL | 2 refills | Status: DC

## 2024-04-11 NOTE — Progress Notes (Signed)
 Virtual Visit via Video Note  I connected with Lindsay Walsh on 04/11/24 at 10:40 AM EDT by a video enabled telemedicine application and verified that I am speaking with the correct person using two identifiers.  Location: Patient: home Provider: office   I discussed the limitations of evaluation and management by telemedicine and the availability of in person appointments. The patient expressed understanding and agreed to proceed.      I discussed the assessment and treatment plan with the patient. The patient was provided an opportunity to ask questions and all were answered. The patient agreed with the plan and demonstrated an understanding of the instructions.   The patient was advised to call back or seek an in-person evaluation if the symptoms worsen or if the condition fails to improve as anticipated.  I provided 20 minutes of non-face-to-face time during this encounter.   Barnie Gull, MD  [facility] MD/PA/NP OP Progress Note  04/11/2024 10:56 AM Lindsay Walsh  MRN:  [id number]  Chief Complaint:  Chief Complaint  Patient presents with   Anxiety   Depression   Follow-up   ADD   HPI: This patient is a 53 year old divorced white female who lives with her boyfriend in [place]. She is on disability for an autoimmune liver disease. She has 3 children and 1 granddaughter   The patient returns for follow-up after 3 months regarding her depression anxiety PTSD and ADD.  She states that overall she is doing fairly well.  She is having a lot of neck pain and is in physical therapy.  She is about to have an injection put in.  She is also helping her father take care of her stepmother who has dementia.  Overall the patient states her mood has been good and her anxiety is under fair control.  She still gets anxious being out among crowds but she still makes herself go out.  She denies any nightmares and she is sleeping well.  She denies significant depression thoughts of self-harm  or suicide. Visit Diagnosis:    ICD-10-CM   1. Recurrent moderate major depressive disorder with anxiety (HCC)  F33.1    F41.9     2. Nightmares associated with chronic post-traumatic stress disorder  F51.5 prazosin  (MINIPRESS ) 2 MG capsule   F43.12     3. Attention deficit hyperactivity disorder (ADHD), predominantly inattentive type  F90.0       Past Psychiatric History: Hospitalization in her early 11s for depression  Past Medical History:  Past Medical History:  Diagnosis Date   Acid reflux    Allergic rhinitis due to pollen    Anxiety    Arthritis    back   Autoimmune hepatitis (HCC)    Back pain    Connective tissue disease    Depression    Dizziness and giddiness    Dyspareunia 05/06/2014   Dysrhythmia    palpatations; on propafenone   Essential (primary) hypertension    Fibromyalgia    Gastroesophageal reflux disease    Chronic abdominal pain; gastroparesis; globus hystericus; irritable bowel syndrome   Glaucoma    Hereditary and idiopathic neuropathy, unspecified    Hyperlipidemia    Irritable bowel syndrome    Major depressive disorder, single episode, unspecified    Morbid (severe) obesity due to excess calories (HCC)    Narcotic dependence (HCC)    Nonspecific mesenteric lymphadenitis    Overweight(278.02)    Pain in limb    Pain in right foot    PTSD (  post-traumatic stress disorder)    Pulmonary nodule    Restless legs syndrome    Sleep apnea    on CPAP machine   Stroke (HCC) 2016   Systemic lupus erythematosus (SLE) in adult [facility])    Tobacco abuse    one pack per day; 35 pack years; quit in February 2023   Type 2 diabetes mellitus with hyperglycemia (HCC)     Past Surgical History:  Procedure Laterality Date   ABDOMINAL HYSTERECTOMY     TAH&BSO   BACK SURGERY     BLADDER SUSPENSION  09/12/2011   Procedure: TRANSVAGINAL TAPE (TVT) PROCEDURE;  Surgeon: Emery LILLETTE Blaze, MD;  Location: [facility];  Service: Urology;  Laterality: N/A;   CESAREAN  SECTION     X3   CHOLECYSTECTOMY     CORONARY PRESSURE/FFR STUDY N/A 04/21/2022   Procedure: INTRAVASCULAR PRESSURE WIRE/FFR STUDY;  Surgeon: Anner Alm ORN, MD;  Location: [facility] INVASIVE CV LAB;  Service: Cardiovascular;  Laterality: N/A;   ESOPHAGOGASTRODUODENOSCOPY ENDOSCOPY     throat stretched per pt.   LEFT HEART CATH AND CORONARY ANGIOGRAPHY N/A 04/21/2022   Procedure: LEFT HEART CATH AND CORONARY ANGIOGRAPHY;  Surgeon: Anner Alm ORN, MD;  Location: [facility] INVASIVE CV LAB;  Service: Cardiovascular;  Laterality: N/A;   LIVER BIOPSY     x4   LUMBAR EPIDURAL INJECTION  01/2012   TOTAL ABDOMINAL HYSTERECTOMY W/ BILATERAL SALPINGOOPHORECTOMY     TUBAL LIGATION     Bilateral   UPPER GASTROINTESTINAL ENDOSCOPY  09/13/2010    Family Psychiatric History: See below  Family History:  Family History  Problem Relation Age of Onset   Diabetes Mother    Hypertension Mother    Anxiety disorder Mother    Depression Mother    Drug abuse Mother    Heart disease Father    Diabetes Father    Anxiety disorder Father    Alcohol abuse Father    Depression Father    OCD Father    Thyroid  disease Sister    Healthy Brother    Healthy Daughter    Healthy Son    ADD / ADHD Son    Alcohol abuse Paternal Grandfather    Depression Paternal Grandfather    Cancer Paternal Grandfather        lung,skin   Tuberculosis Paternal Grandfather    Seizures Paternal Grandmother    Lupus Paternal Grandmother    ADD / ADHD Son    Anesthesia problems Neg Hx    Malignant hyperthermia Neg Hx    Pseudochol deficiency Neg Hx    Hypotension Neg Hx    Bipolar disorder Neg Hx    Dementia Neg Hx    Paranoid behavior Neg Hx    Schizophrenia Neg Hx    Sexual abuse Neg Hx    Physical abuse Neg Hx     Social History:  Social History   Socioeconomic History   Marital status: Divorced    Spouse name: Not on file   Number of children: 3   Years of education: GED   Highest education level: Not on file   Occupational History    Employer: NOT EMPLOYED  Tobacco Use   Smoking status: Former    Current packs/day: 0.00    Average packs/day: 1.5 packs/day for 25.0 years (37.5 ttl pk-yrs)    Types: Cigarettes    Start date: 04/25/1988    Quit date: 04/25/2013    Years since quitting: 10.9    Passive exposure: Never  Smokeless tobacco: Never  Vaping Use   Vaping status: Every Day   Start date: 04/22/2013   Substances: Nicotine , Flavoring  Substance and Sexual Activity   Alcohol use: No   Drug use: No   Sexual activity: Yes    Birth control/protection: Surgical, Abstinence  Other Topics Concern   Not on file  Social History Narrative   Divorced since 1994.Lives with boyfriend of 11 years.On disability.   Social Drivers of Corporate investment banker Strain: Not on file  Food Insecurity: Not on file  Transportation Needs: Not on file  Physical Activity: Not on file  Stress: Not on file  Social Connections: Not on file    Allergies:  Allergies  Allergen Reactions   Doxycycline Shortness Of Breath and Rash    Heart racing   Fentanyl Shortness Of Breath, Itching and Other (See Comments)    Heart racing, SOB   Cefoxitin     Unknown reaction   Ceftin [Cefuroxime Axetil]     Unknown reaction   Iodinated Contrast Media Other (See Comments)    Knots on body   Iohexol  Other (See Comments)    Knots on body    Norvasc [Amlodipine Besylate]     Unknown reaction   Trintellix  [Vortioxetine ]     Tremors, heart race   Wellbutrin [Bupropion Hcl] Nausea And Vomiting   Latex Rash   Tape Rash    Metabolic Disorder Labs: Lab Results  Component Value Date   HGBA1C 11.4 (H) 08/03/2020   MPG 280 08/03/2020   MPG 237.43 08/11/2018   No results found for: PROLACTIN Lab Results  Component Value Date   CHOL 153 04/25/2019   TRIG 141 04/25/2019   HDL 37 04/25/2019   CHOLHDL 4.9 03/06/2015   VLDL 16 03/06/2015   LDLCALC 91 04/25/2019   LDLCALC 104 (H) 03/06/2015   Lab  Results  Component Value Date   TSH 4.616 (H) 05/23/2022   TSH 2.79 08/03/2020    Therapeutic Level Labs: No results found for: LITHIUM No results found for: VALPROATE No results found for: CBMZ  Current Medications: Current Outpatient Medications  Medication Sig Dispense Refill   ACCU-CHEK GUIDE test strip USE TO CHECK BLOOD SUGAR FOUR TIMES DAILY     Accu-Chek Softclix Lancets lancets CHECK SUGAR ONCE DAILY     amphetamine -dextroamphetamine  (ADDERALL) 20 MG tablet Take 1 tablet (20 mg total) by mouth 2 (two) times daily. 60 tablet 0   amphetamine -dextroamphetamine  (ADDERALL) 20 MG tablet Take 1 tablet (20 mg total) by mouth 2 (two) times daily. 60 tablet 0   amphetamine -dextroamphetamine  (ADDERALL) 20 MG tablet Take 1 tablet (20 mg total) by mouth 2 (two) times daily. 60 tablet 0   ARIPiprazole  (ABILIFY ) 2 MG tablet Take 1 tablet (2 mg total) by mouth daily. 30 tablet 2   azaTHIOprine  (IMURAN ) 50 MG tablet Take 1 tablet (50 mg total) by mouth daily. 90 tablet 1   bimatoprost (LUMIGAN) 0.01 % SOLN Place 1 drop into both eyes at bedtime.     Calcium  Citrate-Vitamin D3 (CALCIUM  CITRATE +D) 315-6.25 MG-MCG TABS Take 1 tablet by mouth 2 (two) times daily.     cholecalciferol (VITAMIN D3) 25 MCG (1000 UNIT) tablet Take 2,000 Units by mouth daily.     cycloSPORINE (RESTASIS) 0.05 % ophthalmic emulsion Place 1 drop into both eyes 2 (two) times daily.     dicyclomine  (BENTYL ) 10 MG capsule Take 1 capsule (10 mg total) by mouth 2 (two) times daily as needed. 60  capsule 2   esomeprazole  (NEXIUM ) 40 MG capsule TAKE 1 CAPSULE BY MOUTH DAILY AT 12 NOON 90 capsule 0   famotidine  (PEPCID ) 20 MG tablet Take 1 tablet (20 mg total) by mouth at bedtime. 30 tablet 3   FARXIGA 10 MG TABS tablet Take 10 mg by mouth daily.     fexofenadine (ALLEGRA) 180 MG tablet Take 180 mg by mouth daily.     gabapentin  (NEURONTIN ) 100 MG capsule Take 100 mg by mouth 3 (three) times daily.      HYDROcodone-acetaminophen  (NORCO/VICODIN) 5-325 MG tablet Take 1 tablet by mouth 2 (two) times daily as needed for moderate pain. 0.5 -1 po bid prn     hydroxychloroquine  (PLAQUENIL ) 200 MG tablet Take 1 tablet (200 mg total) by mouth 2 (two) times daily. TAKE 1 TABLET(200 MG) BY MOUTH TWICE DAILY 180 tablet 0   isosorbide  mononitrate (IMDUR ) 30 MG 24 hr tablet Take 30 mg by mouth every morning.     lisinopril  (ZESTRIL ) 40 MG tablet Take 40 mg by mouth daily.     metFORMIN  (GLUCOPHAGE ) 1000 MG tablet Take 1 tablet (1,000 mg total) by mouth 2 (two) times daily with a meal. 60 tablet 3   metFORMIN  (GLUCOPHAGE ) 500 MG tablet Take 500 mg by mouth 2 (two) times daily.     methocarbamol  (ROBAXIN ) 500 MG tablet Take 500 mg by mouth 2 (two) times daily as needed for muscle spasms.     metoCLOPramide  (REGLAN ) 5 MG tablet Take 1 tablet (5 mg total) by mouth 3 (three) times daily before meals. 210 tablet 3   Multiple Vitamins-Minerals (CENTRUM) tablet Take 1 tablet by mouth daily.     nitroGLYCERIN  (NITROLINGUAL ) 0.4 MG/SPRAY spray Place 1 spray under the tongue every 5 (five) minutes x 3 doses as needed for chest pain.     NP THYROID  60 MG tablet TAKE 1 TABLET(60 MG) BY MOUTH DAILY BEFORE BREAKFAST 30 tablet 3   OZEMPIC, 1 MG/DOSE, 4 MG/3ML SOPN Inject 1 mg into the skin once a week.     PARoxetine  (PAXIL ) 40 MG tablet Take 1 tablet (40 mg total) by mouth at bedtime. 90 tablet 2   Polyvinyl Alcohol-Povidone (REFRESH OP) Place 1 drop into both eyes daily as needed (dry eyes).     prazosin  (MINIPRESS ) 2 MG capsule TAKE 1 CAPSULE(2 MG) BY MOUTH AT BEDTIME 30 capsule 2   PRESCRIPTION MEDICATION Take 10 mg by mouth 3 (three) times daily before meals. This is MOTILLIUM ( DOMPERIDONE 10 mg) Tablet     PREVIDENT 5000 PLUS 1.1 % CREA dental cream Place 1 Application onto teeth every morning.     Probiotic Product (PROBIOTIC PO) Take 1 capsule by mouth daily.     propafenone (RYTHMOL SR) 225 MG 12 hr capsule Take 225  mg by mouth every 12 (twelve) hours.     REPATHA 140 MG/ML SOSY Inject 1 mL into the skin every 14 (fourteen) days.     rosuvastatin (CRESTOR) 40 MG tablet Take 40 mg by mouth daily.     spironolactone (ALDACTONE) 25 MG tablet Take 25 mg by mouth daily.     No current facility-administered medications for this visit.     Musculoskeletal: Strength & Muscle Tone: within normal limits Gait & Station: normal Patient leans: N/A  Psychiatric Specialty Exam: Review of Systems  Musculoskeletal:  Positive for arthralgias and neck pain.  All other systems reviewed and are negative.   There were no vitals taken for this visit.There is no  height or weight on file to calculate BMI.  General Appearance: Casual and Fairly Groomed  Eye Contact:  Good  Speech:  Clear and Coherent  Volume:  Normal  Mood:  Euthymic  Affect:  Congruent  Thought Process:  Goal Directed  Orientation:  Full (Time, Place, and Person)  Thought Content: WDL   Suicidal Thoughts:  No  Homicidal Thoughts:  No  Memory:  Immediate;   Good Recent;   Good Remote;   NA  Judgement:  Good  Insight:  Good  Psychomotor Activity:  Normal  Concentration:  Concentration: Good and Attention Span: Good  Recall:  Good  Fund of Knowledge: Good  Language: Good  Akathisia:  No  Handed:  Right  AIMS (if indicated): not done  Assets:  Communication Skills Desire for Improvement Resilience Social Support  ADL's:  Intact  Cognition: WNL  Sleep:  Good   Screenings: GAD-7    Advertising copywriter from 11/26/2023 in [facility] [place] Office Visit from 05/22/2023 in [facility] [place] Office Visit from 04/23/2023 in [facility] [place] Counselor from 09/04/2022 in [facility] [place]  Total GAD-7 Score 14 7 16 19    PHQ2-9    Flowsheet Row Counselor from 11/26/2023 in [facility] at [place] Office Visit from 05/22/2023 in [facility] [place] Office Visit from 04/23/2023 in [facility] [place] Counselor from 09/04/2022 in [facility] [place] Video Visit from 02/08/2022 in [facility] Total Score 5 2 6 6 2   PHQ-9 Total Score 16 6 19 17 7    Flowsheet Row Counselor from 11/26/2023 in [facility] [place] Office Visit from 04/23/2023 in [facility] [place] [facility] ED from 10/27/2022 in [facility] Emergency Department at [facility]  C-SSRS RISK CATEGORY Error: Question 6 not populated No Risk No Risk     Assessment and Plan: This patient is a 53 year old female with a history of PTSD, nightmares depression anxiety and ADHD.  She continues to do well on her current regimen.  She will continue Paxil  40 mg daily for depression, Abilify  2 mg daily for augmentation, prazosin  2 mg at bedtime for nightmares and Adderall 20 mg twice daily for ADHD.  She will return to see me in 3 months  Collaboration of Care: Collaboration of Care: Referral or follow-up with counselor/therapist AEB patient will continue therapy with Jerel Pepper in our office  Patient/Guardian was advised Release of Information must be obtained prior to any record release in order to collaborate their care with an outside provider. Patient/Guardian was advised if they have not already done so to contact the registration department to sign all necessary forms in order for us  to release information regarding their care.   Consent: Patient/Guardian gives verbal consent for treatment and assignment of benefits for services provided during this visit. Patient/Guardian expressed understanding and agreed to proceed.    Barnie Gull, MD 04/11/2024, 10:56 AM

## 2024-04-14 ENCOUNTER — Ambulatory Visit (INDEPENDENT_AMBULATORY_CARE_PROVIDER_SITE_OTHER): Admitting: Clinical

## 2024-04-14 DIAGNOSIS — F419 Anxiety disorder, unspecified: Secondary | ICD-10-CM

## 2024-04-14 DIAGNOSIS — F331 Major depressive disorder, recurrent, moderate: Secondary | ICD-10-CM

## 2024-04-14 DIAGNOSIS — F900 Attention-deficit hyperactivity disorder, predominantly inattentive type: Secondary | ICD-10-CM

## 2024-04-14 DIAGNOSIS — F4310 Post-traumatic stress disorder, unspecified: Secondary | ICD-10-CM | POA: Diagnosis not present
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
## 2024-04-15 ENCOUNTER — Encounter (HOSPITAL_COMMUNITY): Admitting: Physical Therapy

## 2024-04-16 ENCOUNTER — Ambulatory Visit (INDEPENDENT_AMBULATORY_CARE_PROVIDER_SITE_OTHER): Admitting: Podiatry

## 2024-04-16 DIAGNOSIS — L600 Ingrowing nail: Secondary | ICD-10-CM

## 2024-04-16 NOTE — Progress Notes (Signed)
 Subjective:  Patient ID: Lindsay Walsh, female    DOB: Aug 14, 1970,  MRN: [id number]  Chief Complaint  Patient presents with   Nail Problem    Pt stated that she has some ingrown nails     53 y.o. female presents with the above complaint.  Patient presents for bilateral hallux medial border ingrown painful to touch is progressing and worse worse with ambulation is with pressure she would like to have removed has not seen MRIs prior to seeing me denies any other acute complaints pain scale 7 out of 10 dull aching nature she would like to have removed   Review of Systems: Negative except as noted in the HPI. Denies N/V/F/Ch.  Past Medical History:  Diagnosis Date   Acid reflux    Allergic rhinitis due to pollen    Anxiety    Arthritis    back   Autoimmune hepatitis (HCC)    Back pain    Connective tissue disease    Depression    Dizziness and giddiness    Dyspareunia 05/06/2014   Dysrhythmia    palpatations; on propafenone   Essential (primary) hypertension    Fibromyalgia    Gastroesophageal reflux disease    Chronic abdominal pain; gastroparesis; globus hystericus; irritable bowel syndrome   Glaucoma    Hereditary and idiopathic neuropathy, unspecified    Hyperlipidemia    Irritable bowel syndrome    Major depressive disorder, single episode, unspecified    Morbid (severe) obesity due to excess calories (HCC)    Narcotic dependence (HCC)    Nonspecific mesenteric lymphadenitis    Overweight(278.02)    Pain in limb    Pain in right foot    PTSD (post-traumatic stress disorder)    Pulmonary nodule    Restless legs syndrome    Sleep apnea    on CPAP machine   Stroke (HCC) 2016   Systemic lupus erythematosus (SLE) in adult [facility])    Tobacco abuse    one pack per day; 35 pack years; quit in February 2023   Type 2 diabetes mellitus with hyperglycemia (HCC)     Current Outpatient Medications:    ACCU-CHEK GUIDE test strip, USE TO CHECK BLOOD SUGAR FOUR TIMES DAILY,  Disp: , Rfl:    Accu-Chek Softclix Lancets lancets, CHECK SUGAR ONCE DAILY, Disp: , Rfl:    amphetamine -dextroamphetamine  (ADDERALL) 20 MG tablet, Take 1 tablet (20 mg total) by mouth 2 (two) times daily., Disp: 60 tablet, Rfl: 0   amphetamine -dextroamphetamine  (ADDERALL) 20 MG tablet, Take 1 tablet (20 mg total) by mouth 2 (two) times daily., Disp: 60 tablet, Rfl: 0   amphetamine -dextroamphetamine  (ADDERALL) 20 MG tablet, Take 1 tablet (20 mg total) by mouth 2 (two) times daily., Disp: 60 tablet, Rfl: 0   ARIPiprazole  (ABILIFY ) 2 MG tablet, Take 1 tablet (2 mg total) by mouth daily., Disp: 30 tablet, Rfl: 2   azaTHIOprine  (IMURAN ) 50 MG tablet, Take 1 tablet (50 mg total) by mouth daily., Disp: 90 tablet, Rfl: 1   bimatoprost (LUMIGAN) 0.01 % SOLN, Place 1 drop into both eyes at bedtime., Disp: , Rfl:    Calcium  Citrate-Vitamin D3 (CALCIUM  CITRATE +D) 315-6.25 MG-MCG TABS, Take 1 tablet by mouth 2 (two) times daily., Disp: , Rfl:    cholecalciferol (VITAMIN D3) 25 MCG (1000 UNIT) tablet, Take 2,000 Units by mouth daily., Disp: , Rfl:    cycloSPORINE (RESTASIS) 0.05 % ophthalmic emulsion, Place 1 drop into both eyes 2 (two) times daily., Disp: , Rfl:  dicyclomine  (BENTYL ) 10 MG capsule, Take 1 capsule (10 mg total) by mouth 2 (two) times daily as needed., Disp: 60 capsule, Rfl: 2   esomeprazole  (NEXIUM ) 40 MG capsule, TAKE 1 CAPSULE BY MOUTH DAILY AT 12 NOON, Disp: 90 capsule, Rfl: 0   famotidine  (PEPCID ) 20 MG tablet, Take 1 tablet (20 mg total) by mouth at bedtime., Disp: 30 tablet, Rfl: 3   FARXIGA 10 MG TABS tablet, Take 10 mg by mouth daily., Disp: , Rfl:    fexofenadine (ALLEGRA) 180 MG tablet, Take 180 mg by mouth daily., Disp: , Rfl:    gabapentin  (NEURONTIN ) 100 MG capsule, Take 100 mg by mouth 3 (three) times daily., Disp: , Rfl:    HYDROcodone-acetaminophen  (NORCO/VICODIN) 5-325 MG tablet, Take 1 tablet by mouth 2 (two) times daily as needed for moderate pain. 0.5 -1 po bid prn, Disp: ,  Rfl:    hydroxychloroquine  (PLAQUENIL ) 200 MG tablet, Take 1 tablet (200 mg total) by mouth 2 (two) times daily. TAKE 1 TABLET(200 MG) BY MOUTH TWICE DAILY, Disp: 180 tablet, Rfl: 0   isosorbide  mononitrate (IMDUR ) 30 MG 24 hr tablet, Take 30 mg by mouth every morning., Disp: , Rfl:    lisinopril  (ZESTRIL ) 40 MG tablet, Take 40 mg by mouth daily., Disp: , Rfl:    metFORMIN  (GLUCOPHAGE ) 1000 MG tablet, Take 1 tablet (1,000 mg total) by mouth 2 (two) times daily with a meal., Disp: 60 tablet, Rfl: 3   metFORMIN  (GLUCOPHAGE ) 500 MG tablet, Take 500 mg by mouth 2 (two) times daily., Disp: , Rfl:    methocarbamol  (ROBAXIN ) 500 MG tablet, Take 500 mg by mouth 2 (two) times daily as needed for muscle spasms., Disp: , Rfl:    metoCLOPramide  (REGLAN ) 5 MG tablet, Take 1 tablet (5 mg total) by mouth 3 (three) times daily before meals., Disp: 210 tablet, Rfl: 3   Multiple Vitamins-Minerals (CENTRUM) tablet, Take 1 tablet by mouth daily., Disp: , Rfl:    nitroGLYCERIN  (NITROLINGUAL ) 0.4 MG/SPRAY spray, Place 1 spray under the tongue every 5 (five) minutes x 3 doses as needed for chest pain., Disp: , Rfl:    NP THYROID  60 MG tablet, TAKE 1 TABLET(60 MG) BY MOUTH DAILY BEFORE BREAKFAST, Disp: 30 tablet, Rfl: 3   OZEMPIC, 1 MG/DOSE, 4 MG/3ML SOPN, Inject 1 mg into the skin once a week., Disp: , Rfl:    PARoxetine  (PAXIL ) 40 MG tablet, Take 1 tablet (40 mg total) by mouth at bedtime., Disp: 90 tablet, Rfl: 2   Polyvinyl Alcohol-Povidone (REFRESH OP), Place 1 drop into both eyes daily as needed (dry eyes)., Disp: , Rfl:    prazosin  (MINIPRESS ) 2 MG capsule, TAKE 1 CAPSULE(2 MG) BY MOUTH AT BEDTIME, Disp: 30 capsule, Rfl: 2   PRESCRIPTION MEDICATION, Take 10 mg by mouth 3 (three) times daily before meals. This is MOTILLIUM ( DOMPERIDONE 10 mg) Tablet, Disp: , Rfl:    PREVIDENT 5000 PLUS 1.1 % CREA dental cream, Place 1 Application onto teeth every morning., Disp: , Rfl:    Probiotic Product (PROBIOTIC PO), Take 1  capsule by mouth daily., Disp: , Rfl:    propafenone (RYTHMOL SR) 225 MG 12 hr capsule, Take 225 mg by mouth every 12 (twelve) hours., Disp: , Rfl:    REPATHA 140 MG/ML SOSY, Inject 1 mL into the skin every 14 (fourteen) days., Disp: , Rfl:    rosuvastatin (CRESTOR) 40 MG tablet, Take 40 mg by mouth daily., Disp: , Rfl:    spironolactone (ALDACTONE) 25  MG tablet, Take 25 mg by mouth daily., Disp: , Rfl:   Current Facility-Administered Medications:    methylPREDNISolone  acetate (DEPO-MEDROL ) injection 40 mg, 40 mg, Other, Once,   Social History   Tobacco Use  Smoking Status Former   Current packs/day: 0.00   Average packs/day: 1.5 packs/day for 25.0 years (37.5 ttl pk-yrs)   Types: Cigarettes   Start date: 04/25/1988   Quit date: 04/25/2013   Years since quitting: 11.0   Passive exposure: Never  Smokeless Tobacco Never    Allergies  Allergen Reactions   Doxycycline Shortness Of Breath and Rash    Heart racing   Fentanyl Shortness Of Breath, Itching and Other (See Comments)    Heart racing, SOB   Cefoxitin     Unknown reaction   Ceftin [Cefuroxime Axetil]     Unknown reaction   Iodinated Contrast Media Other (See Comments)    Knots on body   Iohexol  Other (See Comments)    Knots on body    Norvasc [Amlodipine Besylate]     Unknown reaction   Trintellix  [Vortioxetine ]     Tremors, heart race   Wellbutrin [Bupropion Hcl] Nausea And Vomiting   Latex Rash   Tape Rash   Objective:  There were no vitals filed for this visit. There is no height or weight on file to calculate BMI. Constitutional Well developed. Well nourished.  Vascular Dorsalis pedis pulses palpable bilaterally. Posterior tibial pulses palpable bilaterally. Capillary refill normal to all digits.  No cyanosis or clubbing noted. Pedal hair growth normal.  Neurologic Normal speech. Oriented to person, place, and time. Epicritic sensation to light touch grossly present bilaterally.  Dermatologic Painful  ingrowing nail at medial nail borders of the hallux nail bilaterally. No other open wounds. No skin lesions.  Orthopedic: Normal joint ROM without pain or crepitus bilaterally. No visible deformities. No bony tenderness.   Radiographs: None Assessment:  No diagnosis found. Plan:  Patient was evaluated and treated and all questions answered.  Ingrown Nail, bilaterally -Patient elects to proceed with minor surgery to remove ingrown toenail removal today. Consent reviewed and signed by patient. -Ingrown nail excised. See procedure note. -Educated on post-procedure care including soaking. Written instructions provided and reviewed. -Patient to follow up in 2 weeks for nail check.  Procedure: Excision of Ingrown Toenail Location: Bilateral 1st toe medial nail borders. Anesthesia: Lidocaine  1% plain; 1.5 mL and Marcaine 0.5% plain; 1.5 mL, digital block. Skin Prep: Betadine. Dressing: Silvadene; telfa; dry, sterile, compression dressing. Technique: Following skin prep, the toe was exsanguinated and a tourniquet was secured at the base of the toe. The affected nail border was freed, split with a nail splitter, and excised. Chemical matrixectomy was then performed with phenol and irrigated out with alcohol. The tourniquet was then removed and sterile dressing applied. Disposition: Patient tolerated procedure well. Patient to return in 2 weeks for follow-up.   No follow-ups on file.

## 2024-04-16 NOTE — Patient Instructions (Signed)

## 2024-04-18 ENCOUNTER — Encounter (HOSPITAL_COMMUNITY)

## 2024-04-18 ENCOUNTER — Ambulatory Visit: Admitting: Physical Medicine and Rehabilitation

## 2024-04-18 ENCOUNTER — Other Ambulatory Visit: Payer: Self-pay

## 2024-04-18 VITALS — BP 139/93

## 2024-04-18 DIAGNOSIS — M47812 Spondylosis without myelopathy or radiculopathy, cervical region: Secondary | ICD-10-CM | POA: Diagnosis not present

## 2024-04-18 MED ORDER — METHYLPREDNISOLONE ACETATE 80 MG/ML IJ SUSP
40.0000 mg | Freq: Once | INTRAMUSCULAR | Status: AC
  Administered 2024-04-18: 40 mg

## 2024-04-18 NOTE — Progress Notes (Signed)
 Pain Scale   Average Pain 5 Patient advising she has neck pain radiating bilaterally to shoulders however it is worse on the left side        +Driver, -BT, -Dye Allergies.

## 2024-04-18 NOTE — Therapy (Deleted)
 OUTPATIENT PHYSICAL THERAPY  CERVICAL AND THORACOLUMBAR TREATMENT   Patient Name: Lindsay Walsh MRN: [id number] DOB:22-Aug-1970, 53 y.o., female Today's Date: 04/18/2024  END OF SESSION:    Past Medical History:  Diagnosis Date   Acid reflux    Allergic rhinitis due to pollen    Anxiety    Arthritis    back   Autoimmune hepatitis (HCC)    Back pain    Connective tissue disease    Depression    Dizziness and giddiness    Dyspareunia 05/06/2014   Dysrhythmia    palpatations; on propafenone   Essential (primary) hypertension    Fibromyalgia    Gastroesophageal reflux disease    Chronic abdominal pain; gastroparesis; globus hystericus; irritable bowel syndrome   Glaucoma    Hereditary and idiopathic neuropathy, unspecified    Hyperlipidemia    Irritable bowel syndrome    Major depressive disorder, single episode, unspecified    Morbid (severe) obesity due to excess calories (HCC)    Narcotic dependence (HCC)    Nonspecific mesenteric lymphadenitis    Overweight(278.02)    Pain in limb    Pain in right foot    PTSD (post-traumatic stress disorder)    Pulmonary nodule    Restless legs syndrome    Sleep apnea    on CPAP machine   Stroke (HCC) 2016   Systemic lupus erythematosus (SLE) in adult [facility])    Tobacco abuse    one pack per day; 35 pack years; quit in February 2023   Type 2 diabetes mellitus with hyperglycemia (HCC)    Past Surgical History:  Procedure Laterality Date   ABDOMINAL HYSTERECTOMY     TAH&BSO   BACK SURGERY     BLADDER SUSPENSION  09/12/2011   Procedure: TRANSVAGINAL TAPE (TVT) PROCEDURE;  Surgeon: Emery LILLETTE Blaze, MD;  Location: [facility];  Service: Urology;  Laterality: N/A;   CESAREAN SECTION     X3   CHOLECYSTECTOMY     CORONARY PRESSURE/FFR STUDY N/A 04/21/2022   Procedure: INTRAVASCULAR PRESSURE WIRE/FFR STUDY;  Surgeon: Anner Alm ORN, MD;  Location: [facility] INVASIVE CV LAB;  Service: Cardiovascular;  Laterality: N/A;    ESOPHAGOGASTRODUODENOSCOPY ENDOSCOPY     throat stretched per pt.   LEFT HEART CATH AND CORONARY ANGIOGRAPHY N/A 04/21/2022   Procedure: LEFT HEART CATH AND CORONARY ANGIOGRAPHY;  Surgeon: Anner Alm ORN, MD;  Location: [facility] INVASIVE CV LAB;  Service: Cardiovascular;  Laterality: N/A;   LIVER BIOPSY     x4   LUMBAR EPIDURAL INJECTION  01/2012   TOTAL ABDOMINAL HYSTERECTOMY W/ BILATERAL SALPINGOOPHORECTOMY     TUBAL LIGATION     Bilateral   UPPER GASTROINTESTINAL ENDOSCOPY  09/13/2010   Patient Active Problem List   Diagnosis Date Noted   Abdominal bloating 10/10/2023   Pain in right knee 03/28/2023   Diabetes mellitus without complication (HCC) 03/27/2023   Liver fibrosis 03/02/2023   Pain in left shoulder 09/25/2022   Progressive angina (HCC)    Coronary artery calcification seen on CT scan    History of nuclear stress test    Abnormal nuclear stress test 04/11/2022   Atypical angina 04/11/2022   Bilateral carpal tunnel syndrome 04/05/2022   Positive ANA (antinuclear antibody) 11/16/2021   History of colonic polyps 11/15/2021   Bilateral hand pain 06/02/2020   OAB (overactive bladder) 11/05/2019   Recurrent UTI 11/05/2019   Diabetic gastroparesis (HCC) 08/21/2019   Diarrhea 07/15/2019   Abnormal finding on urinalysis 07/15/2019   Morbid (severe) obesity  due to excess calories (HCC)    Major depressive disorder, single episode, unspecified    Restless legs syndrome    Sleep apnea    Fibromyalgia    Hereditary and idiopathic neuropathy, unspecified    Nonspecific mesenteric lymphadenitis    Pain in right foot    Type 2 diabetes mellitus with hyperglycemia (HCC)    Hypothyroidism 08/22/2018   DM type 2 causing vascular disease (HCC) 08/21/2018   Hyperlipidemia associated with type 2 diabetes mellitus (HCC) 08/21/2018   Essential hypertension, benign 08/21/2018   Influenza A 08/10/2018   SIRS (systemic inflammatory response syndrome) (HCC) 08/10/2018   Cerebral  infarction (HCC) 03/05/2015   Dyspareunia 05/06/2014   Obesity 09/30/2013   Bilateral hip pain 07/16/2013   Chronic pain syndrome 07/16/2013   Degenerative disc disease, lumbar 07/16/2013   Greater trochanteric bursitis of both hips 07/16/2013   Nightmares associated with chronic post-traumatic stress disorder 10/18/2012   Sprain of foot 03/14/2012   Weakness 01/29/2012   Irritable bowel syndrome with diarrhea 08/01/2011   Gastroesophageal reflux disease    Depression    Chest pain    Fasting hyperglycemia    TOBACCO ABUSE 04/28/2010   NARCOTIC ABUSE 04/28/2010   PULMONARY NODULE 04/28/2010   Autoimmune hepatitis (HCC) 04/28/2010   Myalgia and myositis 01/06/2009    PCP: Kahoano, Haku K, MD  REFERRING PROVIDER: Trudy Duwaine BRAVO, NP  REFERRING DIAG: M54.2 (ICD-10-CM) - Cervicalgia M54.42,M54.41,G89.29 (ICD-10-CM) - Chronic bilateral low back pain with bilateral sciatica M54.16 (ICD-10-CM) - Lumbar radiculopathy M79.18 (ICD-10-CM) - Myofascial pain syndrome M79.7 (ICD-10-CM) - FibromyalgiaM54.2 (ICD-10-CM)   Rationale for Evaluation and Treatment: Rehabilitation  THERAPY DIAG:  No diagnosis found.  ONSET DATE: chronic; for years as long as I can remember  SUBJECTIVE:                                                                                                                                                                                           SUBJECTIVE STATEMENT: ***   EVAL: Chronic neck and back pain.  Has Been treated at [facility] pain management and transferred her pain management care to PCP.  She has had injections in her back and had the nerve burned but neither helped for very long.  Has an appointment for neck injections coming up in 2 weeks.  Referred here to therapy. Here previously for knee pain  PERTINENT HISTORY:  DM  CPT Chronic knee pain  PAIN:  Are you having pain? Yes: NPRS scale: 5/10 neck and back after pain med Pain location: neck and  back Pain description: back sore and neck aching and throbbing Aggravating factors:  sit or stand or walk too long Relieving factors: change positions, pain meds, stretching; chiropractor  PRECAUTIONS: None     WEIGHT BEARING RESTRICTIONS: No  FALLS:  Has patient fallen in last 6 months? Yes. Number of falls 1; got dizzy  OCCUPATION: disabled  PLOF: Independent  PATIENT GOALS: learn some exercises to ease my pain  NEXT MD VISIT: 04/03/24 for neck injections  OBJECTIVE:  Note: Objective measures were completed at Evaluation unless otherwise noted.  DIAGNOSTIC FINDINGS:  IMPRESSION: 1. Severe facet arthropathy on the left at C3-4 with edema in the articular processes. Severe left facet arthropathy at C4-5 and moderate left facet arthropathy at C5-6  IMPRESSION: 1. Dominant degenerative finding is L4-L5 facet arthropathy, superimposed on subtle grade 1 Anterolisthesis there with some evidence of mild acute exacerbation of the chronic facet joint arthritis. No associated spinal stenosis or convincing neural impingement. But facet disease can be a source of low back pain which sometimes radiates.   2. Small central disc herniation at L5-S1 without convincing stenosis. Mild to moderate facet degeneration there and also at L3-L4.      PATIENT SURVEYS:  NDI:  NECK DISABILITY INDEX  Date: 03/19/24 Score  Total 24/50; 48%   Minimum Detectable Change (90% confidence): 5 points or 10% points  COGNITION: Overall cognitive status: Within functional limits for tasks assessed     SENSATION: WFL    POSTURE: rounded shoulders and forward head  PALPATION: Tightness bilateral upper traps  CERVICAL ROM: *painful  Active ROM AROM (deg) eval  Flexion 33  Extension 36*  Right lateral flexion 29  Left lateral flexion 33  Right rotation 48*  Left rotation 44*   (Blank rows = not tested)  LUMBAR ROM:   AROM eval  Flexion Fingertips to 1 past knee cap  Extension  20% available; gets dizzy  Right lateral flexion   Left lateral flexion   Right rotation   Left rotation    (Blank rows = not tested)   LOWER EXTREMITY MMT:    MMT Right eval Left eval  Hip flexion 4+ 4+  Hip extension    Hip abduction    Hip adduction    Hip internal rotation    Hip external rotation    Knee flexion    Knee extension 5 4  Ankle dorsiflexion 5 5  Ankle plantarflexion    Ankle inversion    Ankle eversion     (Blank rows = not tested)  FUNCTIONAL TESTS:  5 times sit to stand: 22.34 knees popping  GAIT: Distance walked: 50 ft in clinic Assistive device utilized: None Level of assistance: Modified independence Comments: slight decreased speed and antalgic gait  TREATMENT DATE:                                   04/18/24  EXERCISE LOG  Exercise Repetitions and Resistance Comments                       Blank cell = exercise not performed today   04/10/24 MHP donned to mid back and neck while in supine Decompression exercises, 10X5 each Lower Trunk Rotation, 10 each side, 5 holds Abdominal bracing 10X5 Bridge 10X 3 SLR 10X each with core stabilization cervical rotation, x10 each way   03/24/24: Review of goals and HEP MHP donned to mid back and neck while in supine Decompression exercises, 1-5, one round of  each Lower Trunk Rotation, 10 each side SKTC, 10x each LE, pt reports feeling a stretch Hamstring length: 135 deg bilaterally Hamstring stretch, 30x2 Supine cervical rotation, x10 each way  Upper Trap stretch, 30x2    03/19/24 physical therapy evaluation and HEP instruction                                                                                                                                PATIENT EDUCATION:  Education details: Patient educated on exam findings, POC, scope of PT, HEP, and benefit of aquatic exercise. Person educated: Patient Education method: Explanation, Demonstration, and Handouts Education  comprehension: verbalized understanding, returned demonstration, verbal cues required, and tactile cues required  HOME EXERCISE PROGRAM: Decompression exercises 1-5  Access Code: [email or web address] URL: [email or web address] Date: 03/24/2024 Prepared by:   Exercises - Seated Cervical Sidebending Stretch  - 2 x daily - 7 x weekly - 1 sets - 3 reps  03/24/24 - Supine Lower Trunk Rotation  - 2 x daily - 7 x weekly - 2 sets - 10 reps - Hooklying Hamstring Stretch with Strap  - 2 x daily - 7 x weekly - 3 sets - 30 hold - Supine Cervical Rotation AROM on Pillow  - 2 x daily - 7 x weekly - 2 sets - 10 reps  Access Code: [email or web address] URL: [email or web address] Date: 04/10/2024 - Supine Transversus Abdominis Bracing - Hands on Stomach  - 2 x daily - 7 x weekly - 2 sets - 10 reps - Beginner Bridge  - 2 x daily - 7 x weekly - 2 sets - 10 reps - Supine Pelvic Tilt with Straight Leg Raise  - 2 x daily - 7 x weekly - 2 sets - 10 reps  ASSESSMENT:  CLINICAL IMPRESSION: ***   EVAL: Patient is a 53 y.o. female who was seen today for physical therapy evaluation and treatment for M54.2 (ICD-10-CM) - Cervicalgia M54.42,M54.41,G89.29 (ICD-10-CM) - Chronic bilateral low back pain with bilateral sciatica M54.16 (ICD-10-CM) - Lumbar radiculopathy M79.18 (ICD-10-CM) - Myofascial pain syndrome M79.7 (ICD-10-CM) - Fibromyalgia. Patient demonstrates muscle weakness, reduced ROM, and fascial restrictions which are likely contributing to symptoms of pain and are negatively impacting patient ability to perform ADLs and functional mobility tasks. Patient will benefit from skilled physical therapy services to address these deficits to reduce pain and improve level of function with ADLs and functional mobility tasks.  OBJECTIVE IMPAIRMENTS: Abnormal gait, decreased activity tolerance, decreased mobility, difficulty walking, decreased ROM, and decreased strength.   ACTIVITY LIMITATIONS: carrying,  lifting, bending, sitting, and standing  PARTICIPATION LIMITATIONS: meal prep, cleaning, shopping, and community activity   REHAB POTENTIAL: Good  CLINICAL DECISION MAKING: Evolving/moderate complexity  EVALUATION COMPLEXITY: Moderate   GOALS: Goals reviewed with patient? Yes  SHORT TERM GOALS: Target date: 04/02/2024  patient will be independent with initial HEP  Baseline: Goal status: INITIAL  2.  Patient will report 30% improvement overall  Baseline:  Goal status:  INITIAL  LONG TERM GOALS: Target date: 04/16/2024  Patient will be independent in self management strategies to improve quality of life and functional outcomes.  Baseline:  Goal status: INITIAL  2.  Patient will report 50% improvement overall  Baseline:  Goal status: INITIAL  3.  Patient will improve NDI score 5 points,  19/50 or less to demonstrate improved perceived function  Baseline: 24/50 Goal status: INITIAL  4.  Patient will increase cervical mobility by 20 degrees throughout to improve ability to scan for safety with driving  Baseline: see above Goal status: INITIAL  5.  Patient will increase lumbar mobility flexion to fingertips to mid shin to improve ability to bend over to pick up items In her household Baseline:  Goal status: INITIAL  6.  Patient will improve 5 times sit to stand score to 15 sec or less to demonstrate improved functional mobility and increased leg strength.    Baseline: 22.34 sec Goal status: INITIAL  PLAN:  PT FREQUENCY: 2x/week  PT DURATION: 4 weeks  PLANNED INTERVENTIONS: [id number]- PT Re-evaluation, 97110-Therapeutic exercises, 97530- Therapeutic activity, 97112- Neuromuscular re-education, 97535- Self Care, [id number]- Manual therapy, [id number]- Gait training, [phone]- Orthotic Fit/training, [phone]- Canalith repositioning, [id number]- Aquatic Therapy, [phone]- Splinting, [phone]- Wound care (first 20 sq cm), 97598- Wound care (each additional 20 sq cm)Patient/Family education,  Balance training, Stair training, Taping, Dry Needling, Joint mobilization, Joint manipulation, Spinal manipulation, Spinal mobilization, Scar mobilization, and DME instructions.  SABRA  PLAN FOR NEXT SESSION: continue to progress postural strengthening and spinal mobility.  Next session add tband decompression exercises.    Lacinda Fass, PT, DPT  12:25 PM, 04/18/24

## 2024-04-21 ENCOUNTER — Encounter (HOSPITAL_COMMUNITY)

## 2024-04-23 ENCOUNTER — Encounter (HOSPITAL_COMMUNITY)

## 2024-04-23 ENCOUNTER — Encounter (INDEPENDENT_AMBULATORY_CARE_PROVIDER_SITE_OTHER): Payer: Self-pay | Admitting: Gastroenterology

## 2024-04-28 ENCOUNTER — Ambulatory Visit (HOSPITAL_COMMUNITY)

## 2024-04-28 ENCOUNTER — Encounter (HOSPITAL_COMMUNITY): Payer: Self-pay

## 2024-04-28 DIAGNOSIS — G8929 Other chronic pain: Secondary | ICD-10-CM

## 2024-04-28 DIAGNOSIS — M5416 Radiculopathy, lumbar region: Secondary | ICD-10-CM

## 2024-04-28 DIAGNOSIS — M542 Cervicalgia: Secondary | ICD-10-CM | POA: Diagnosis not present

## 2024-04-28 NOTE — Progress Notes (Signed)
 Lindsay Walsh - 53 y.o. female MRN [id number]  Date of birth: [birth year]  Office Visit Note: Visit Date: 04/18/2024 PCP: Kahoano, Haku K, MD Referred by: Kahoano, Haku K, MD  Subjective: Chief Complaint  Patient presents with   Neck - Pain   HPI:  Lindsay Walsh is a 53 y.o. female who comes in today at the request of Duwaine Pouch, FNP for planned Bilateral  C3-4 and C4-5 Cervical facet/medial branch block with fluoroscopic guidance.  The patient has failed conservative care including home exercise, medications, time and activity modification.  This injection will be diagnostic and hopefully therapeutic.  Please see requesting physician notes for further details and justification.  Exam has shown concordant pain with facet joint loading.   ROS Otherwise per HPI.  Assessment & Plan: Visit Diagnoses:    ICD-10-CM   1. Cervical spondylosis without myelopathy  M47.812 XR C-ARM NO REPORT    Facet Injection    methylPREDNISolone  acetate (DEPO-MEDROL ) injection 40 mg      Plan: No additional findings.   Meds & Orders:  Meds ordered this encounter  Medications   methylPREDNISolone  acetate (DEPO-MEDROL ) injection 40 mg    Orders Placed This Encounter  Procedures   Facet Injection   XR C-ARM NO REPORT    Follow-up: Return for visit to requesting provider as needed.   Procedures: No procedures performed  Cervical Facet Joint Intra-Articular Injection with Fluoroscopic Guidance  Patient: Lindsay Walsh      Date of Birth: [birth year] MRN: [id number] PCP: Kahoano, Haku K, MD      Visit Date: 04/18/2024   Universal Protocol:    Date/Time: 10/20/257:31 PM  Consent Given By: the patient  Position: PRONE  Additional Comments: Vital signs were monitored before and after the procedure. Patient was prepped and draped in the usual sterile fashion. The correct patient, procedure, and site was verified.   Injection Procedure Details:  Procedure Site One Meds  Administered:  Meds ordered this encounter  Medications   methylPREDNISolone  acetate (DEPO-MEDROL ) injection 40 mg     Laterality: Bilateral  Location/Site:  C3-4 C4-5  Needle size: 25 G  Needle type: Spinal  Needle Placement: Articular  Findings:  -Contrast Used: not used due to allergy  -Comments: biplanar imaging used to confirm placement  Procedure Details: The region overlying the facet joints mentioned above were localized under fluoroscopic visualization. The needle was inserted down to the level of the lateral mass of the superior articular process of the facet joint to be injected. Then, the needle was walked off inferiorly into the lateral aspect of the facet joint. Bi-planar images were used for confirming placement and spot radiographs were documented.  A 0.25 ml volume of Omnipaque -240 was injected into the facet joint and a standard partial arthrogram was obtained. Radiographs were obtained of the arthrogram. A 0.5 ml. volume of the steroid/anesthetic solution was injected into the joint. This procedure was repeated for each facet joint injected.   Additional Comments:  The patient tolerated the procedure well Dressing: Band-Aid    Post-procedure details: Patient was observed during the procedure. Post-procedure instructions were reviewed.  Patient left the clinic in stable condition.   Clinical History: CLINICAL DATA:  Chronic neck pain   EXAM: MRI CERVICAL SPINE WITHOUT CONTRAST   TECHNIQUE: Multiplanar, multisequence MR imaging of the cervical spine was performed. No intravenous contrast was administered.   COMPARISON:  Plain film August 08, 2019   FINDINGS: The craniocervical junction is normal.   There  is no significant bone marrow signal abnormality.   The cervical spinal cord is normal.   C2-C3: Normal   C3-C4: The disc is normal. There is severe facet arthropathy on the left with edema in the articular processes. There is mild  left neural foraminal stenosis   C4-C5: The disc is normal. There is severe facet arthropathy on the left. There is mild left neural foraminal stenosis   C5-C6: The disc is normal. There is moderate left facet arthropathy. No spinal stenosis or foraminal stenosis   C6-C7: Minimal disc bulge.  No significant facet disease   C7-T1: Normal   IMPRESSION: 1. Severe facet arthropathy on the left at C3-4 with edema in the articular processes. Severe left facet arthropathy at C4-5 and moderate left facet arthropathy at C5-6     Electronically Signed   By: Nancyann Burns M.D.   On: 02/18/2024 09:42     Objective:  VS:  HT:    WT:   BMI:     BP:(!) 139/93  HR: bpm  TEMP: ( )  RESP:  Physical Exam Vitals and nursing note reviewed.  Constitutional:      General: She is not in acute distress.    Appearance: Normal appearance. She is not ill-appearing.  HENT:     Head: Normocephalic and atraumatic.     Right Ear: External ear normal.     Left Ear: External ear normal.  Eyes:     Extraocular Movements: Extraocular movements intact.  Cardiovascular:     Rate and Rhythm: Normal rate.     Pulses: Normal pulses.  Musculoskeletal:     Cervical back: Tenderness present. No rigidity.     Right lower leg: No edema.     Left lower leg: No edema.     Comments: Patient has good strength in the upper extremities including 5 out of 5 strength in wrist extension long finger flexion and APB.  There is no atrophy of the hands intrinsically.  There is a negative Hoffmann's test.   Lymphadenopathy:     Cervical: No cervical adenopathy.  Skin:    Findings: No erythema, lesion or rash.  Neurological:     General: No focal deficit present.     Mental Status: She is alert and oriented to person, place, and time.     Sensory: No sensory deficit.     Motor: No weakness or abnormal muscle tone.     Coordination: Coordination normal.  Psychiatric:        Mood and Affect: Mood normal.         Behavior: Behavior normal.      Imaging: No results found.

## 2024-04-28 NOTE — Procedures (Signed)
 Cervical Facet Joint Intra-Articular Injection with Fluoroscopic Guidance  Patient: Lindsay Walsh      Date of Birth: Feb 15, 1971 MRN: [id number] PCP: Kahoano, Haku K, MD      Visit Date: 04/18/2024   Universal Protocol:    Date/Time: 10/20/257:31 PM  Consent Given By: the patient  Position: PRONE  Additional Comments: Vital signs were monitored before and after the procedure. Patient was prepped and draped in the usual sterile fashion. The correct patient, procedure, and site was verified.   Injection Procedure Details:  Procedure Site One Meds Administered:  Meds ordered this encounter  Medications   methylPREDNISolone  acetate (DEPO-MEDROL ) injection 40 mg     Laterality: Bilateral  Location/Site:  C3-4 C4-5  Needle size: 25 G  Needle type: Spinal  Needle Placement: Articular  Findings:  -Contrast Used: not used due to allergy  -Comments: biplanar imaging used to confirm placement  Procedure Details: The region overlying the facet joints mentioned above were localized under fluoroscopic visualization. The needle was inserted down to the level of the lateral mass of the superior articular process of the facet joint to be injected. Then, the needle was walked off inferiorly into the lateral aspect of the facet joint. Bi-planar images were used for confirming placement and spot radiographs were documented.  A 0.25 ml volume of Omnipaque -240 was injected into the facet joint and a standard partial arthrogram was obtained. Radiographs were obtained of the arthrogram. A 0.5 ml. volume of the steroid/anesthetic solution was injected into the joint. This procedure was repeated for each facet joint injected.   Additional Comments:  The patient tolerated the procedure well Dressing: Band-Aid    Post-procedure details: Patient was observed during the procedure. Post-procedure instructions were reviewed.  Patient left the clinic in stable condition.

## 2024-04-28 NOTE — Therapy (Signed)
 OUTPATIENT PHYSICAL THERAPY  CERVICAL AND THORACOLUMBAR TREATMENT/PN/Re-CERT   Patient Name: Lindsay Walsh MRN: [id number] DOB:1971-05-22, 53 y.o., female Today's Date: 04/28/2024   Progress Note Reporting Period 03/19/24 to 04/28/24  See note below for Objective Data and Assessment of Progress/Goals.       END OF SESSION:  PT End of Session - 04/28/24 1506     Visit Number 4    Number of Visits 8    Date for Recertification  05/09/24    Authorization Type [employer]; dual complete    Authorization Time Period No auth required    Progress Note Due on Visit 10    PT Start Time 1507    PT Stop Time 1545    PT Time Calculation (min) 38 min    Activity Tolerance Patient tolerated treatment well    Behavior During Therapy WFL for tasks assessed/performed          Past Medical History:  Diagnosis Date   Acid reflux    Allergic rhinitis due to pollen    Anxiety    Arthritis    back   Autoimmune hepatitis (HCC)    Back pain    Connective tissue disease    Depression    Dizziness and giddiness    Dyspareunia 05/06/2014   Dysrhythmia    palpatations; on propafenone   Essential (primary) hypertension    Fibromyalgia    Gastroesophageal reflux disease    Chronic abdominal pain; gastroparesis; globus hystericus; irritable bowel syndrome   Glaucoma    Hereditary and idiopathic neuropathy, unspecified    Hyperlipidemia    Irritable bowel syndrome    Major depressive disorder, single episode, unspecified    Morbid (severe) obesity due to excess calories (HCC)    Narcotic dependence (HCC)    Nonspecific mesenteric lymphadenitis    Overweight(278.02)    Pain in limb    Pain in right foot    PTSD (post-traumatic stress disorder)    Pulmonary nodule    Restless legs syndrome    Sleep apnea    on CPAP machine   Stroke (HCC) 2016   Systemic lupus erythematosus (SLE) in adult [facility])    Tobacco abuse    one pack per day; 35 pack years; quit in  February 2023   Type 2 diabetes mellitus with hyperglycemia (HCC)    Past Surgical History:  Procedure Laterality Date   ABDOMINAL HYSTERECTOMY     TAH&BSO   BACK SURGERY     BLADDER SUSPENSION  09/12/2011   Procedure: TRANSVAGINAL TAPE (TVT) PROCEDURE;  Surgeon: Emery LILLETTE Blaze, MD;  Location: [facility];  Service: Urology;  Laterality: N/A;   CESAREAN SECTION     X3   CHOLECYSTECTOMY     CORONARY PRESSURE/FFR STUDY N/A 04/21/2022   Procedure: INTRAVASCULAR PRESSURE WIRE/FFR STUDY;  Surgeon: Anner Alm ORN, MD;  Location: [facility] INVASIVE CV LAB;  Service: Cardiovascular;  Laterality: N/A;   ESOPHAGOGASTRODUODENOSCOPY ENDOSCOPY     throat stretched per pt.   LEFT HEART CATH AND CORONARY ANGIOGRAPHY N/A 04/21/2022   Procedure: LEFT HEART CATH AND CORONARY ANGIOGRAPHY;  Surgeon: Anner Alm ORN, MD;  Location: [facility] INVASIVE CV LAB;  Service: Cardiovascular;  Laterality: N/A;   LIVER BIOPSY     x4   LUMBAR EPIDURAL INJECTION  01/2012   TOTAL ABDOMINAL HYSTERECTOMY W/ BILATERAL SALPINGOOPHORECTOMY     TUBAL LIGATION     Bilateral   UPPER GASTROINTESTINAL ENDOSCOPY  09/13/2010   Patient Active Problem List  Diagnosis Date Noted   Abdominal bloating 10/10/2023   Pain in right knee 03/28/2023   Diabetes mellitus without complication (HCC) 03/27/2023   Liver fibrosis 03/02/2023   Pain in left shoulder 09/25/2022   Progressive angina (HCC)    Coronary artery calcification seen on CT scan    History of nuclear stress test    Abnormal nuclear stress test 04/11/2022   Atypical angina 04/11/2022   Bilateral carpal tunnel syndrome 04/05/2022   Positive ANA (antinuclear antibody) 11/16/2021   History of colonic polyps 11/15/2021   Bilateral hand pain 06/02/2020   OAB (overactive bladder) 11/05/2019   Recurrent UTI 11/05/2019   Diabetic gastroparesis (HCC) 08/21/2019   Diarrhea 07/15/2019   Abnormal finding on urinalysis 07/15/2019   Morbid (severe) obesity due to excess calories (HCC)     Major depressive disorder, single episode, unspecified    Restless legs syndrome    Sleep apnea    Fibromyalgia    Hereditary and idiopathic neuropathy, unspecified    Nonspecific mesenteric lymphadenitis    Pain in right foot    Type 2 diabetes mellitus with hyperglycemia (HCC)    Hypothyroidism 08/22/2018   DM type 2 causing vascular disease (HCC) 08/21/2018   Hyperlipidemia associated with type 2 diabetes mellitus (HCC) 08/21/2018   Essential hypertension, benign 08/21/2018   Influenza A 08/10/2018   SIRS (systemic inflammatory response syndrome) (HCC) 08/10/2018   Cerebral infarction (HCC) 03/05/2015   Dyspareunia 05/06/2014   Obesity 09/30/2013   Bilateral hip pain 07/16/2013   Chronic pain syndrome 07/16/2013   Degenerative disc disease, lumbar 07/16/2013   Greater trochanteric bursitis of both hips 07/16/2013   Nightmares associated with chronic post-traumatic stress disorder 10/18/2012   Sprain of foot 03/14/2012   Weakness 01/29/2012   Irritable bowel syndrome with diarrhea 08/01/2011   Gastroesophageal reflux disease    Depression    Chest pain    Fasting hyperglycemia    TOBACCO ABUSE 04/28/2010   NARCOTIC ABUSE 04/28/2010   PULMONARY NODULE 04/28/2010   Autoimmune hepatitis (HCC) 04/28/2010   Myalgia and myositis 01/06/2009    PCP: Kahoano, Haku K, MD  REFERRING PROVIDER: Trudy Duwaine BRAVO, NP  REFERRING DIAG: M54.2 (ICD-10-CM) - Cervicalgia M54.42,M54.41,G89.29 (ICD-10-CM) - Chronic bilateral low back pain with bilateral sciatica M54.16 (ICD-10-CM) - Lumbar radiculopathy M79.18 (ICD-10-CM) - Myofascial pain syndrome M79.7 (ICD-10-CM) - FibromyalgiaM54.2 (ICD-10-CM)   Rationale for Evaluation and Treatment: Rehabilitation  THERAPY DIAG:  Cervicalgia - Plan: PT plan of care cert/re-cert  Chronic bilateral low back pain with bilateral sciatica - Plan: PT plan of care cert/re-cert  Lumbar radiculopathy - Plan: PT plan of care cert/re-cert  ONSET DATE:  chronic; for years as long as I can remember  SUBJECTIVE:  SUBJECTIVE STATEMENT: Pt reports pain today is the same at 5/10, more in the low back. Reports neck feeling better since injection last week. HEP going well when able to do it. Reports she's been helping with her step mother who broke her hip.    EVAL: Chronic neck and back pain.  Has Been treated at [facility] pain management and transferred her pain management care to PCP.  She has had injections in her back and had the nerve burned but neither helped for very long.  Has an appointment for neck injections coming up in 2 weeks.  Referred here to therapy. Here previously for knee pain  PERTINENT HISTORY:  DM  CPT Chronic knee pain  PAIN:  Are you having pain? Yes: NPRS scale: 5/10 neck and back after pain med Pain location: neck and back Pain description: back sore and neck aching and throbbing Aggravating factors: sit or stand or walk too long Relieving factors: change positions, pain meds, stretching; chiropractor  PRECAUTIONS: None     WEIGHT BEARING RESTRICTIONS: No  FALLS:  Has patient fallen in last 6 months? Yes. Number of falls 1; got dizzy  OCCUPATION: disabled  PLOF: Independent  PATIENT GOALS: learn some exercises to ease my pain  NEXT MD VISIT: 04/03/24 for neck injections  OBJECTIVE:  Note: Objective measures were completed at Evaluation unless otherwise noted.  DIAGNOSTIC FINDINGS:  IMPRESSION: 1. Severe facet arthropathy on the left at C3-4 with edema in the articular processes. Severe left facet arthropathy at C4-5 and moderate left facet arthropathy at C5-6  IMPRESSION: 1. Dominant degenerative finding is L4-L5 facet arthropathy, superimposed on subtle grade 1 Anterolisthesis there with some evidence of mild  acute exacerbation of the chronic facet joint arthritis. No associated spinal stenosis or convincing neural impingement. But facet disease can be a source of low back pain which sometimes radiates.   2. Small central disc herniation at L5-S1 without convincing stenosis. Mild to moderate facet degeneration there and also at L3-L4.      PATIENT SURVEYS:  NDI:  NECK DISABILITY INDEX  Date: 03/19/24 Score  Total 24/50; 48%   Minimum Detectable Change (90% confidence): 5 points or 10% points  04/28/24: Neck Disability Index score: 14 / 50 = 28.0 %   COGNITION: Overall cognitive status: Within functional limits for tasks assessed     SENSATION: WFL    POSTURE: rounded shoulders and forward head  PALPATION: Tightness bilateral upper traps  CERVICAL ROM: *painful  Active ROM AROM (deg) eval 04/28/24:   Flexion 33 30  Extension 36* 35  Right lateral flexion 29 30*  Left lateral flexion 33 30  Right rotation 48* 58  Left rotation 44* 34*   (Blank rows = not tested)  LUMBAR ROM:   AROM eval 04/28/24:  Flexion Fingertips to 1 past knee cap ~ 1 inch above ankle, pulling in back   Extension 20% available; gets dizzy 25% avail, pressure, light headed   Right lateral flexion    Left lateral flexion    Right rotation    Left rotation     (Blank rows = not tested)   LOWER EXTREMITY MMT:    MMT Right eval Left eval R 04/28/24 L 04/28/24  Hip flexion 4+ 4+ 4+ * 4+  Hip extension      Hip abduction      Hip adduction      Hip internal rotation      Hip external rotation      Knee flexion  Knee extension 5 4 4- 4  Ankle dorsiflexion 5 5 5 5   Ankle plantarflexion      Ankle inversion      Ankle eversion       (Blank rows = not tested)  FUNCTIONAL TESTS:  5 times sit to stand: 22.34 knees popping  04/28/24: 5 times sit to stand: 17 seconds, 1 pop in L knee  GAIT: Distance walked: 50 ft in clinic Assistive device utilized: None Level of  assistance: Modified independence Comments: slight decreased speed and antalgic gait  TREATMENT DATE:  04/28/24: NuStep, seat 5, level 1. 6' NDI Cervical ROM Lumbar ROM LE MMT 5xSTS Review of goals  MHP donned to low back and neck while in supine: LTR, 2' DKTC with physio ball, 2', pt reporting pain relief with this exercise, added to HEP Bridge, 2x10, 3 holds    04/10/24 MHP donned to mid back and neck while in supine Decompression exercises, 10X5 each Lower Trunk Rotation, 10 each side, 5 holds Abdominal bracing 10X5 Bridge 10X 3 SLR 10X each with core stabilization cervical rotation, x10 each way   03/24/24: Review of goals and HEP MHP donned to mid back and neck while in supine Decompression exercises, 1-5, one round of each Lower Trunk Rotation, 10 each side SKTC, 10x each LE, pt reports feeling a stretch Hamstring length: 135 deg bilaterally Hamstring stretch, 30x2 Supine cervical rotation, x10 each way  Upper Trap stretch, 30x2    03/19/24 physical therapy evaluation and HEP instruction                                                                                                                                PATIENT EDUCATION:  Education details: Patient educated on exam findings, POC, scope of PT, HEP, and benefit of aquatic exercise. Person educated: Patient Education method: Explanation, Demonstration, and Handouts Education comprehension: verbalized understanding, returned demonstration, verbal cues required, and tactile cues required  HOME EXERCISE PROGRAM: Decompression exercises 1-5  Access Code: [email or web address] URL: [email or web address] Date: 03/24/2024 Prepared by:   Exercises - Seated Cervical Sidebending Stretch  - 2 x daily - 7 x weekly - 1 sets - 3 reps  03/24/24 - Supine Lower Trunk Rotation  - 2 x daily - 7 x weekly - 2 sets - 10 reps - Hooklying Hamstring Stretch with Strap  - 2 x daily - 7 x weekly - 3 sets - 30  hold - Supine Cervical Rotation AROM on Pillow  - 2 x daily - 7 x weekly - 2 sets - 10 reps  Access Code: [email or web address] URL: [email or web address] Date: 04/10/2024 - Supine Transversus Abdominis Bracing - Hands on Stomach  - 2 x daily - 7 x weekly - 2 sets - 10 reps - Beginner Bridge  - 2 x daily - 7 x weekly - 2 sets - 10 reps - Supine Pelvic Tilt with Straight Leg Raise  - 2 x daily - 7 x weekly -  2 sets - 10 reps  Exercises - Supine Double Knee to Chest  - 2 x daily - 7 x weekly - 3 min hold  ASSESSMENT:  CLINICAL IMPRESSION: Progress note performed this date. Patient reports moderate pain at start of session. On this date, patient demonstrates improvement with self perceived function via NDI, lumbar ROM, and some improvements noted with certain cervical motions (see above). LE strength around similar levels when assessed with MMT but functionally pt has made improvements via 5 times sit to stand. Patient reports overall 30-40% improvement since beginning PT and has met 3 of 9 goals this date. Patient reported overall decrease in pain at end of session, 3/10. Patient will benefit from continued skilled physical therapy in order to address current level of mobility, and pain to improve overall function and quality of life.       EVAL: Patient is a 53 y.o. female who was seen today for physical therapy evaluation and treatment for M54.2 (ICD-10-CM) - Cervicalgia M54.42,M54.41,G89.29 (ICD-10-CM) - Chronic bilateral low back pain with bilateral sciatica M54.16 (ICD-10-CM) - Lumbar radiculopathy M79.18 (ICD-10-CM) - Myofascial pain syndrome M79.7 (ICD-10-CM) - Fibromyalgia. Patient demonstrates muscle weakness, reduced ROM, and fascial restrictions which are likely contributing to symptoms of pain and are negatively impacting patient ability to perform ADLs and functional mobility tasks. Patient will benefit from skilled physical therapy services to address these deficits to reduce pain  and improve level of function with ADLs and functional mobility tasks.  OBJECTIVE IMPAIRMENTS: Abnormal gait, decreased activity tolerance, decreased mobility, difficulty walking, decreased ROM, and decreased strength.   ACTIVITY LIMITATIONS: carrying, lifting, bending, sitting, and standing  PARTICIPATION LIMITATIONS: meal prep, cleaning, shopping, and community activity   REHAB POTENTIAL: Good  CLINICAL DECISION MAKING: Evolving/moderate complexity  EVALUATION COMPLEXITY: Moderate   GOALS: Goals reviewed with patient? Yes  SHORT TERM GOALS: Target date: 05/09/2024  patient will be independent with initial HEP  Baseline:  Reports at least 2x/wk Goal status: IN PROGRESS  2.  Patient will report 30% improvement overall  Baseline: 30-40% improvement Goal status: MET  LONG TERM GOALS: Target date: 05/09/2024  Patient will be independent in self management strategies to improve quality of life and functional outcomes.  Baseline:  Goal status: IN PROGRESS  2.  Patient will report 50% improvement overall  Baseline:  Goal status: IN PROGRESS  3.  Patient will improve NDI score 5 points,  19/50 or less to demonstrate improved perceived function  Baseline: 24/50 --> 14/50 Goal status: MET  4.  Patient will increase cervical mobility by 20 degrees throughout to improve ability to scan for safety with driving  Baseline: see above Goal status: IN PROGRESS  5.  Patient will increase lumbar mobility flexion to fingertips to mid shin to improve ability to bend over to pick up items In her household Baseline:  Goal status: MET  6.  Patient will improve 5 times sit to stand score to 15 sec or less to demonstrate improved functional mobility and increased leg strength.    Baseline: 22.34 sec --> 17 seconds Goal status: IN PROGRESS  PLAN:  PT FREQUENCY: 2x/week  PT DURATION: 4 weeks  PLANNED INTERVENTIONS: [id number]- PT Re-evaluation, 97110-Therapeutic exercises, 97530-  Therapeutic activity, 97112- Neuromuscular re-education, 97535- Self Care, 02859- Manual therapy, [id number]- Gait training, [phone]- Orthotic Fit/training, [phone]- Canalith repositioning, [id number]- Aquatic Therapy, [id number]- Splinting, [id number]- Wound care (first 20 sq cm), 02401- Wound care (each additional 20 sq cm)Patient/Family education, Balance training, Stair training, Taping, Dry Needling, Joint  mobilization, Joint manipulation, Spinal manipulation, Spinal mobilization, Scar mobilization, and DME instructions.  SABRA  PLAN FOR NEXT SESSION: continue to progress postural strengthening and spinal mobility.  Next session add tband decompression exercise  5:16 PM, 04/28/24 Rosaria Settler, PT, DPT [facility] [place]

## 2024-05-01 ENCOUNTER — Encounter (HOSPITAL_COMMUNITY): Payer: Self-pay | Admitting: Physical Therapy

## 2024-05-01 ENCOUNTER — Encounter (HOSPITAL_COMMUNITY): Admitting: Physical Therapy

## 2024-05-01 NOTE — Therapy (Signed)
 [facility] Outpatient Rehabilitation at [facility] [address] [place], [place], [address] Phone: [phone]   Fax:  [phone]  Patient Details  Name: Lindsay Walsh MRN: [id number] Date of Birth: Oct 05, 1970 Referring Provider:  No ref. provider found  Encounter Date: 05/01/2024 First no show of this round of therapy.  PT was discharged last round to therapy due to 3 no shows.  Called PT but mailbox was full and therapist was unable to leave a message.   Montie Metro, PT CLT [phone]  05/01/2024, 3:15 PM  [facility] Outpatient Rehabilitation at [facility] [address] [place], [place], [address] Phone: [phone]   Fax:  [phone]

## 2024-05-07 ENCOUNTER — Encounter (HOSPITAL_COMMUNITY)

## 2024-05-07 ENCOUNTER — Encounter (INDEPENDENT_AMBULATORY_CARE_PROVIDER_SITE_OTHER): Payer: Self-pay | Admitting: Gastroenterology

## 2024-05-07 ENCOUNTER — Telehealth (HOSPITAL_COMMUNITY): Payer: Self-pay

## 2024-05-07 ENCOUNTER — Other Ambulatory Visit: Payer: Self-pay | Admitting: Internal Medicine

## 2024-05-07 ENCOUNTER — Ambulatory Visit (INDEPENDENT_AMBULATORY_CARE_PROVIDER_SITE_OTHER): Admitting: Gastroenterology

## 2024-05-07 VITALS — BP 107/75 | HR 98 | Temp 98.1°F | Ht 59.0 in | Wt 195.2 lb

## 2024-05-07 DIAGNOSIS — K754 Autoimmune hepatitis: Secondary | ICD-10-CM | POA: Diagnosis not present

## 2024-05-07 DIAGNOSIS — K3184 Gastroparesis: Secondary | ICD-10-CM

## 2024-05-07 DIAGNOSIS — M79642 Pain in left hand: Secondary | ICD-10-CM

## 2024-05-07 DIAGNOSIS — E1143 Type 2 diabetes mellitus with diabetic autonomic (poly)neuropathy: Secondary | ICD-10-CM

## 2024-05-07 DIAGNOSIS — R7689 Other specified abnormal immunological findings in serum: Secondary | ICD-10-CM

## 2024-05-07 NOTE — Patient Instructions (Signed)
 Continue with metoclopramide  5 mg every 8 hours as needed for nausea Attempt to minimize the use of opiates Schedule colonoscopy Continue azathioprine  50 mg daily Follow-up with hepatology at [facility]

## 2024-05-07 NOTE — Telephone Encounter (Signed)
 Patient had a MD appointment today and totally forgot she also had PT.  She is aware of her next appointment on Friday 10/31 and plans to attend.    1:53 PM, 05/07/24 [name] Small Lindsay Walsh MPT [facility] [place] [phone]

## 2024-05-07 NOTE — Progress Notes (Signed)
 Toribio Fortune, M.D. Gastroenterology & Hepatology [facility] [address] [place], [place] [address]  Primary Care Physician: Kahoano, Haku K, MD [address] [place] [address]  I will communicate my assessment and recommendations to the referring MD via EMR.  Problems: Autoimmune hepatitis with stage II-III fibrosis, improved most recent liver biopsy Gastroparesis, patient on Ozempic and opiates  History of Present Illness: Lindsay Walsh is a 53 y.o. female with past medical history of lupus, autoimmune hepatitis with stage II -III fibrosis, diabetic gastroparesis, GERD, depression, fibromyalgia, chronic opiate use, diabetes, hyperlipidemia, IBS, anxiety and obesity, who presents for follow up of autoimmune hepatitis and gastroparesis.  The patient was last seen on 10/09/2023. At that time, the patient was referred to [facility] for SIBO breath test as part of evaluation of bloating.  Patient was advised to continue align.  Also advised to follow closely with hepatology to determine if she will continue azathioprine  50 mg daily.  Patient was continued on Nexium  40 mg daily and famotidine  20 mg at bedtime.  Also advised to continue Reglan  5 mg 3 times daily for gastroparesis -it was possibly related to the use of opiates and Ozempic.  Patient was advised to continue AZA 50 mg daily for now.  FibroScan at [facility] on 04/08/2022 with median KPA 7.4, S3 steatosis and F2 fibrosis.   Patient underwent liver biopsy on 09/26/23 at [facility].  Liver parenchyma with ~20% steatosis. Patchy and mild portal inflammation. Trichrome stain shows no pathologic fibrosis.  Last labs from 03/20/2024   normal IgG 887.  She reports that she has some nausea, and sometimes vomits. She has felt improvement with Reglan  as it decreases her symptoms. Takes it with every meal, 30 minutes prior. She is still on Ozempic weekly and hydrocodone 1-2  times a day. Has occasional abdominal pain in abdomen and occasional bloating.  The patient denies having any  fever, chills, hematochezia, melena, hematemesis, abdominal pain, diarrhea, jaundice, pruritus or weight loss.  Last EGD: 04/25/2018 - normal Last Colonoscopy: 04/25/2018 - had one tubular adenomas   Repeat colonoscopy in 2025 per patient - patient was told at [facility] in last appointment to perform it this year.  Past Medical History: Past Medical History:  Diagnosis Date   Acid reflux    Allergic rhinitis due to pollen    Anxiety    Arthritis    back   Autoimmune hepatitis (HCC)    Back pain    Connective tissue disease    Depression    Dizziness and giddiness    Dyspareunia 05/06/2014   Dysrhythmia    palpatations; on propafenone   Essential (primary) hypertension    Fibromyalgia    Gastroesophageal reflux disease    Chronic abdominal pain; gastroparesis; globus hystericus; irritable bowel syndrome   Glaucoma    Hereditary and idiopathic neuropathy, unspecified    Hyperlipidemia    Irritable bowel syndrome    Major depressive disorder, single episode, unspecified    Morbid (severe) obesity due to excess calories (HCC)    Narcotic dependence (HCC)    Nonspecific mesenteric lymphadenitis    Overweight(278.02)    Pain in limb    Pain in right foot    PTSD (post-traumatic stress disorder)    Pulmonary nodule    Restless legs syndrome    Sleep apnea    on CPAP machine   Stroke (HCC) 2016   Systemic lupus erythematosus (SLE) in adult [facility])    Tobacco abuse  one pack per day; 35 pack years; quit in February 2023   Type 2 diabetes mellitus with hyperglycemia (HCC)     Past Surgical History: Past Surgical History:  Procedure Laterality Date   ABDOMINAL HYSTERECTOMY     TAH&BSO   BACK SURGERY     BLADDER SUSPENSION  09/12/2011   Procedure: TRANSVAGINAL TAPE (TVT) PROCEDURE;  Surgeon: Emery LILLETTE Blaze, MD;  Location: [facility];  Service: Urology;  Laterality: N/A;    CESAREAN SECTION     X3   CHOLECYSTECTOMY     CORONARY PRESSURE/FFR STUDY N/A 04/21/2022   Procedure: INTRAVASCULAR PRESSURE WIRE/FFR STUDY;  Surgeon: Anner Alm ORN, MD;  Location: [facility] INVASIVE CV LAB;  Service: Cardiovascular;  Laterality: N/A;   ESOPHAGOGASTRODUODENOSCOPY ENDOSCOPY     throat stretched per pt.   LEFT HEART CATH AND CORONARY ANGIOGRAPHY N/A 04/21/2022   Procedure: LEFT HEART CATH AND CORONARY ANGIOGRAPHY;  Surgeon: Anner Alm ORN, MD;  Location: [facility] INVASIVE CV LAB;  Service: Cardiovascular;  Laterality: N/A;   LIVER BIOPSY     x4   LUMBAR EPIDURAL INJECTION  01/2012   TOTAL ABDOMINAL HYSTERECTOMY W/ BILATERAL SALPINGOOPHORECTOMY     TUBAL LIGATION     Bilateral   UPPER GASTROINTESTINAL ENDOSCOPY  09/13/2010    Family History: Family History  Problem Relation Age of Onset   Diabetes Mother    Hypertension Mother    Anxiety disorder Mother    Depression Mother    Drug abuse Mother    Heart disease Father    Diabetes Father    Anxiety disorder Father    Alcohol abuse Father    Depression Father    OCD Father    Thyroid  disease Sister    Healthy Brother    Healthy Daughter    Healthy Son    ADD / ADHD Son    Alcohol abuse Paternal Grandfather    Depression Paternal Grandfather    Cancer Paternal Grandfather        lung,skin   Tuberculosis Paternal Grandfather    Seizures Paternal Grandmother    Lupus Paternal Grandmother    ADD / ADHD Son    Anesthesia problems Neg Hx    Malignant hyperthermia Neg Hx    Pseudochol deficiency Neg Hx    Hypotension Neg Hx    Bipolar disorder Neg Hx    Dementia Neg Hx    Paranoid behavior Neg Hx    Schizophrenia Neg Hx    Sexual abuse Neg Hx    Physical abuse Neg Hx     Social History: Social History   Tobacco Use  Smoking Status Former   Current packs/day: 0.00   Average packs/day: 1.5 packs/day for 25.0 years (37.5 ttl pk-yrs)   Types: Cigarettes   Start date: 04/25/1988   Quit date: 04/25/2013    Years since quitting: 11.0   Passive exposure: Never  Smokeless Tobacco Never   Social History   Substance and Sexual Activity  Alcohol Use No   Social History   Substance and Sexual Activity  Drug Use No    Allergies: Allergies  Allergen Reactions   Doxycycline Shortness Of Breath and Rash    Heart racing   Fentanyl Shortness Of Breath, Itching and Other (See Comments)    Heart racing, SOB   Cefoxitin     Unknown reaction   Ceftin [Cefuroxime Axetil]     Unknown reaction   Iodinated Contrast Media Other (See Comments)    Knots on body   Iohexol   Other (See Comments)    Knots on body    Norvasc [Amlodipine Besylate]     Unknown reaction   Trintellix  [Vortioxetine ]     Tremors, heart race   Wellbutrin [Bupropion Hcl] Nausea And Vomiting   Latex Rash   Tape Rash    Medications: Current Outpatient Medications  Medication Sig Dispense Refill   ACCU-CHEK GUIDE test strip USE TO CHECK BLOOD SUGAR FOUR TIMES DAILY     Accu-Chek Softclix Lancets lancets CHECK SUGAR ONCE DAILY     amphetamine -dextroamphetamine  (ADDERALL) 20 MG tablet Take 1 tablet (20 mg total) by mouth 2 (two) times daily. 60 tablet 0   amphetamine -dextroamphetamine  (ADDERALL) 20 MG tablet Take 1 tablet (20 mg total) by mouth 2 (two) times daily. 60 tablet 0   amphetamine -dextroamphetamine  (ADDERALL) 20 MG tablet Take 1 tablet (20 mg total) by mouth 2 (two) times daily. 60 tablet 0   ARIPiprazole  (ABILIFY ) 2 MG tablet Take 1 tablet (2 mg total) by mouth daily. 30 tablet 2   azaTHIOprine  (IMURAN ) 50 MG tablet Take 1 tablet (50 mg total) by mouth daily. 90 tablet 1   bimatoprost (LUMIGAN) 0.01 % SOLN Place 1 drop into both eyes at bedtime.     Calcium  Citrate-Vitamin D3 (CALCIUM  CITRATE +D) 315-6.25 MG-MCG TABS Take 1 tablet by mouth 2 (two) times daily.     cholecalciferol (VITAMIN D3) 25 MCG (1000 UNIT) tablet Take 2,000 Units by mouth daily.     cycloSPORINE (RESTASIS) 0.05 % ophthalmic emulsion Place  1 drop into both eyes 2 (two) times daily.     esomeprazole  (NEXIUM ) 40 MG capsule TAKE 1 CAPSULE BY MOUTH DAILY AT 12 NOON 90 capsule 0   famotidine  (PEPCID ) 20 MG tablet Take 1 tablet (20 mg total) by mouth at bedtime. 30 tablet 3   FARXIGA 10 MG TABS tablet Take 10 mg by mouth daily.     fexofenadine (ALLEGRA) 180 MG tablet Take 180 mg by mouth daily.     gabapentin  (NEURONTIN ) 100 MG capsule Take 100 mg by mouth 3 (three) times daily.     HYDROcodone-acetaminophen  (NORCO/VICODIN) 5-325 MG tablet Take 1 tablet by mouth 2 (two) times daily as needed for moderate pain. 0.5 -1 po bid prn     hydroxychloroquine  (PLAQUENIL ) 200 MG tablet Take 1 tablet (200 mg total) by mouth 2 (two) times daily. TAKE 1 TABLET(200 MG) BY MOUTH TWICE DAILY 180 tablet 0   isosorbide  mononitrate (IMDUR ) 30 MG 24 hr tablet Take 30 mg by mouth every morning.     lisinopril  (ZESTRIL ) 40 MG tablet Take 40 mg by mouth daily.     metFORMIN  (GLUCOPHAGE ) 500 MG tablet Take 500 mg by mouth 2 (two) times daily.     methocarbamol  (ROBAXIN ) 500 MG tablet Take 500 mg by mouth 2 (two) times daily as needed for muscle spasms.     metoCLOPramide  (REGLAN ) 5 MG tablet Take 1 tablet (5 mg total) by mouth 3 (three) times daily before meals. 210 tablet 3   Multiple Vitamins-Minerals (CENTRUM) tablet Take 1 tablet by mouth daily.     nitroGLYCERIN  (NITROLINGUAL ) 0.4 MG/SPRAY spray Place 1 spray under the tongue every 5 (five) minutes x 3 doses as needed for chest pain.     OZEMPIC, 1 MG/DOSE, 4 MG/3ML SOPN Inject 1 mg into the skin once a week.     PARoxetine  (PAXIL ) 40 MG tablet Take 1 tablet (40 mg total) by mouth at bedtime. 90 tablet 2   Polyvinyl Alcohol-Povidone (REFRESH OP)  Place 1 drop into both eyes daily as needed (dry eyes).     prazosin  (MINIPRESS ) 2 MG capsule TAKE 1 CAPSULE(2 MG) BY MOUTH AT BEDTIME 30 capsule 2   PRESCRIPTION MEDICATION Take 10 mg by mouth 3 (three) times daily before meals. This is MOTILLIUM ( DOMPERIDONE 10  mg) Tablet     PREVIDENT 5000 PLUS 1.1 % CREA dental cream Place 1 Application onto teeth every morning.     Probiotic Product (PROBIOTIC PO) Take 1 capsule by mouth daily.     propafenone (RYTHMOL SR) 225 MG 12 hr capsule Take 225 mg by mouth every 12 (twelve) hours.     REPATHA 140 MG/ML SOSY Inject 1 mL into the skin every 14 (fourteen) days.     rosuvastatin (CRESTOR) 40 MG tablet Take 40 mg by mouth daily.     spironolactone (ALDACTONE) 25 MG tablet Take 25 mg by mouth daily.     dicyclomine  (BENTYL ) 10 MG capsule Take 1 capsule (10 mg total) by mouth 2 (two) times daily as needed. (Patient not taking: Reported on 05/07/2024) 60 capsule 2   No current facility-administered medications for this visit.    Review of Systems: GENERAL: negative for malaise, night sweats HEENT: No changes in hearing or vision, no nose bleeds or other nasal problems. NECK: Negative for lumps, goiter, pain and significant neck swelling RESPIRATORY: Negative for cough, wheezing CARDIOVASCULAR: Negative for chest pain, leg swelling, palpitations, orthopnea GI: SEE HPI MUSCULOSKELETAL: Negative for joint pain or swelling, back pain, and muscle pain. SKIN: Negative for lesions, rash PSYCH: Negative for sleep disturbance, mood disorder and recent psychosocial stressors. HEMATOLOGY Negative for prolonged bleeding, bruising easily, and swollen nodes. ENDOCRINE: Negative for cold or heat intolerance, polyuria, polydipsia and goiter. NEURO: negative for tremor, gait imbalance, syncope and seizures. The remainder of the review of systems is noncontributory.   Physical Exam: BP 107/75   Pulse 98   Temp 98.1 F (36.7 C)   Ht 4' 11 (1.499 m)   Wt 195 lb 3.2 oz (88.5 kg)   BMI 39.43 kg/m  GENERAL: The patient is AO x3, in no acute distress. HEENT: Head is normocephalic and atraumatic. EOMI are intact. Mouth is well hydrated and without lesions. NECK: Supple. No masses LUNGS: Clear to auscultation. No presence  of rhonchi/wheezing/rales. Adequate chest expansion HEART: RRR, normal s1 and s2. ABDOMEN: Soft, nontender, no guarding, no peritoneal signs, and nondistended. BS +. No masses. EXTREMITIES: Without any cyanosis, clubbing, rash, lesions or edema. NEUROLOGIC: AOx3, no focal motor deficit. SKIN: no jaundice, no rashes  Imaging/Labs: as above  I personally reviewed and interpreted the available labs, imaging and endoscopic files.  Impression and Plan: CHANIYA GENTER is a 53 y.o. female with past medical history of lupus, autoimmune hepatitis with stage II -III fibrosis, diabetic gastroparesis, GERD, depression, fibromyalgia, chronic opiate use, diabetes, hyperlipidemia, IBS, anxiety and obesity, who presents for follow up of autoimmune hepatitis and gastroparesis.  Patient has presented significant relief of her symptoms while taking Reglan  and has been able to have better oral intake compared to prior.  She denies any worsening of her symptoms recently.  We discussed that her gastroparesis and abnormal GES were likely a result of diabetes, Ozempic and opiate use.  Fortunately, she has responded to the medication.  I advised her to minimize the use of opiates if possible, so she can try to take as less regular as possible given the potential of side effects.  She understood and agreed.  Her autoimmune hepatitis appears to be well-controlled on very low-dose of azathioprine  as she has achieved biochemical remission.  She will need to follow closely with [facility].  Finally, she was advised by [facility] to have a repeat colonoscopy as she is due for repeat colonoscopy based on the most recent procedure performed in 2019.  She would like to schedule this at our facility.  Will schedule colonoscopy today.  -Continue with metoclopramide  5 mg every 8 hours as needed for nausea -Attempt to minimize the use of opiates -Schedule colonoscopy -Continue azathioprine  50 mg daily -Follow-up with  hepatology at [facility]  All questions were answered.      Toribio Fortune, MD Gastroenterology and Hepatology [facility] Gastroenterology

## 2024-05-08 ENCOUNTER — Telehealth (INDEPENDENT_AMBULATORY_CARE_PROVIDER_SITE_OTHER): Payer: Self-pay

## 2024-05-08 NOTE — Telephone Encounter (Signed)
 ATC patient to schedule TCS, no answer. LVM for call back.

## 2024-05-08 NOTE — Telephone Encounter (Signed)
 Last Fill: 08/31/2023  Eye exam: 11/14/2022 normal   Spoke with patient she is still waiting on a cancellation    Labs: 03/20/2024 CBC and CMP WNL  Next Visit: 07/14/2024  Last Visit: 01/08/2024  IK:Endpupcz ANA   Current Dose per office note 01/08/2024: hydroxychloroquine  400 mg daily  Okay to refill Plaquenil ?

## 2024-05-09 ENCOUNTER — Telehealth (HOSPITAL_COMMUNITY): Payer: Self-pay

## 2024-05-09 ENCOUNTER — Encounter (HOSPITAL_COMMUNITY)

## 2024-05-09 NOTE — Telephone Encounter (Signed)
 Pt was called due to her no-show of today's PT appointment. PT left a VM notifying her that today was her last scheduled appointment and that she will be discharged from PT if she does not call to reschedule.  Lacinda Fass, PT, DPT

## 2024-05-12 ENCOUNTER — Encounter: Payer: Self-pay | Admitting: Radiology

## 2024-05-14 ENCOUNTER — Other Ambulatory Visit (HOSPITAL_COMMUNITY): Payer: Self-pay | Admitting: Psychiatry

## 2024-05-14 ENCOUNTER — Ambulatory Visit (HOSPITAL_COMMUNITY): Admitting: Clinical

## 2024-05-20 ENCOUNTER — Ambulatory Visit (INDEPENDENT_AMBULATORY_CARE_PROVIDER_SITE_OTHER): Admitting: Clinical

## 2024-05-20 DIAGNOSIS — F419 Anxiety disorder, unspecified: Secondary | ICD-10-CM

## 2024-05-20 DIAGNOSIS — F4310 Post-traumatic stress disorder, unspecified: Secondary | ICD-10-CM | POA: Diagnosis not present

## 2024-05-20 DIAGNOSIS — F331 Major depressive disorder, recurrent, moderate: Secondary | ICD-10-CM

## 2024-05-20 DIAGNOSIS — F900 Attention-deficit hyperactivity disorder, predominantly inattentive type: Secondary | ICD-10-CM

## 2024-05-20 NOTE — Progress Notes (Signed)
 Virtual Visit via Video Note   I connected with Lindsay Walsh on 05/20/24 at  10:00 AM EDT by a video enabled telemedicine application and verified that I am speaking with the correct person using two identifiers.   Location: Patient: home Provider: office   I discussed the limitations of evaluation and management by telemedicine and the availability of in person appointments. The patient expressed understanding and agreed to proceed.     THERAPIST PROGRESS NOTE   Session Time: 10:00 AM-10:30 AM   Participation Level: Active   Behavioral Response: CasualAlertDepressed   Type of Therapy: Individual Therapy   Treatment Goals addressed: Coping for MH diagnoses   Interventions: CBT   Summary: Lindsay Walsh is a 53 y.o. female who presents with Depression, ADHD, PTSD  . The OPT therapist worked with the patient for her ongoing OPT treatment session. The OPT therapist utilized Motivational Interviewing to assist in creating therapeutic repore. The patient in the session was engaged and work in collaboration giving feedback about her triggers and symptoms over the past few weeks. The patient spoke about her interaction with family, adjustment to time change, and recent weather changes. The patient spoke about  med therapy and feeling this continues to do well and helpful in management of her MH.The patient spoke about her plans for the upcoming Thanksgiving. The patient spoke the ongoing impact of getting out of the home when she can making her feel better. The patient spoke about her Step Mothers health decline and the impact of this on the patient and her Father who has been care-giving for her Step-Mother. The OPT therapist utilized Cognitive Behavioral Therapy through cognitive restructuring as well as worked with the patient on coping strategies to assist in management of mood. The OPT therapist worked with the patient on management of her Anxiety and ensuring she is being mindful of  her basic needs.   Suicidal/Homicidal: Nowithout intent/plan   Therapist Response: The OPT therapist worked with the patient for the patients scheduled session. The patient was engaged in her session and gave feedback in relation to triggers, symptoms, and behavior responses over the past few weeks. The OPT therapist worked with the patient utilizing an in session Cognitive Behavioral Therapy exercise. The patient was responsive in the session and verbalized, Its difficult for me to see the decline with my Step Mother dementia and health and the impact that she health situation is having on my Father.  The patient spoke about being excited to be around family for upcoming Thanksgiving holiday. The OPT therapist worked with the patient on placing her energy/focus/and attention in areas of her life she has control over and doing her self check ins throughout the week. The OPT therapist promoted the patient being active within her physical health limitations and change environment when possible.. The OPT therapist will continue treatment work with the patient in her next scheduled session   Plan: Return again in 3 weeks.   Diagnosis       Axis I: Recurrent Moderate MDD with anxiety, ADHD inattentive type, PTSD.                             Axis II: No diagnosis     Collaboration of Care: Overview of patient involvement with Dr. Okey in the med therapy program .   Patient/Guardian was advised Release of Information must be obtained prior to any record release in order to collaborate their care  with an outside provider. Patient/Guardian was advised if they have not already done so to contact the registration department to sign all necessary forms in order for us  to release information regarding their care.    Consent: Patient/Guardian gives verbal consent for treatment and assignment of benefits for services provided during this visit. Patient/Guardian expressed understanding and agreed to proceed.     I discussed the assessment and treatment plan with the patient. The patient was provided an opportunity to ask questions and all were answered. The patient agreed with the plan and demonstrated an understanding of the instructions.   The patient was advised to call back or seek an in-person evaluation if the symptoms worsen or if the condition fails to improve as anticipated.   I provided 30 minutes of non-face-to-face time during this encounter.   Lindsay ONEIDA Pepper, LCSW   05/20/2024

## 2024-05-21 NOTE — Telephone Encounter (Addendum)
 ATC, someone answer and then hung up. Called back and same thing Letter mailed   Needs colonoscopy with Dr. Eartha, any room, 2 days prep. See med list for instructions for meds to hold

## 2024-06-10 ENCOUNTER — Other Ambulatory Visit (INDEPENDENT_AMBULATORY_CARE_PROVIDER_SITE_OTHER): Payer: Self-pay | Admitting: Gastroenterology

## 2024-06-10 DIAGNOSIS — K219 Gastro-esophageal reflux disease without esophagitis: Secondary | ICD-10-CM

## 2024-06-23 ENCOUNTER — Ambulatory Visit (HOSPITAL_COMMUNITY): Admitting: Clinical

## 2024-06-30 NOTE — Telephone Encounter (Signed)
 Patient left vm wanting to schedule colonoscopy. Returned patients call, no answer. LVM for call back.

## 2024-06-30 NOTE — Progress Notes (Signed)
 "  Office Visit Note  Patient: Lindsay Walsh             Date of Birth: September 01, 1970           MRN: [id number]             PCP: Kahoano, Haku K, MD Referring: Kahoano, Haku K, MD Visit Date: 07/14/2024   Subjective:  Discussed the use of AI scribe software for clinical note transcription with the patient, who gave verbal consent to proceed.  History of Present Illness   Lindsay Walsh is a 53 y.o. female here for follow up for seronegative inflammatory arthritis on hydroxychloroquine  400 mg daily also on azathioprine  50 mg daily for autoimmune hepatitis.     She has experienced worsening knee pain, particularly in the left knee, over the past month. The pain is most pronounced when descending stairs. She previously received injections in her knees over six months ago, which provided relief at the time.  In addition to knee pain, she has persistent pain in her hands and fingers, which has also worsened over the past month, coinciding with colder weather. She experiences swelling in her fingers most days. No recent illness since her last visit.  She mentions a recent episode where she experienced a sensation of her right hip 'locking up,' preventing her from lifting her foot, which resolved after a short period. This was a new occurrence for her.  She also reports significant hair thinning, noting that she often finds a handful of hair after washing it. She is not currently taking any supplements for this issue.        Previous HPI 01/08/2024 AMIYRAH Walsh is a 53 y.o. female here for follow up for seronegative inflammatory arthritis on hydroxychloroquine  400 mg daily also on azathioprine  50 mg daily for autoimmune hepatitis.     She had a liver biopsy in March which showed no active autoimmune hepatitis or cirrhosis. She is currently on a low dose of Imuran  and is concerned about the possibility of her liver enzymes increasing if she discontinues the medication, although her lab  results have been stable.   She is undergoing physical therapy for knee pain, which has improved her condition. Previously, she received knee injections in February, which provided significant relief. However, the pain is gradually returning, especially when descending stairs, and is located at the front of the knee.   She experiences pain in her back, hands, and wrists, with the wrist pain being significant enough to have required injections in the wrists for CTS, approximately a year ago. These injections provided relief, but the pain and numbness have returned. She reports numbness in her hands and sometimes experiences swelling in the wrist area, which exacerbates her symptoms. Numbness sometimes extends into her forearm.           Previous HPI 08/31/2023 Lindsay Walsh is a 54 y.o. female here for follow up for seronegative inflammatory arthritis on hydroxychloroquine  400 mg daily also on azathioprine  50 mg daily for autoimmune hepatitis.     She has ongoing issues with her knees, worse in the right knee as before but now also involving the left knee, which is tender and painful. The pain is located around the patella and is associated with crepitus. She has a history of bone spurs at the superior aspect of the patella. The pain sometimes causes her knee to give out, especially when descending stairs, necessitating support. No swelling is noted, but the area is  tender. She is starting to walk on her treadmill as part of her physical activity.   She discusses her back issues, mentioning a previous appointment with Dr. Gillie who dismissed her concerns due to mild findings on lumbar MRI. She experiences numbness in her left arm and hand, which her cardiologist suggested might be related to her back and neck rather than her heart. She is in the process of seeking a second opinion regarding her back issues.   She is currently taking Plaquenil  and Imuran . She is on a low dose of Imuran , taking 50  mg daily, and is concerned about stopping it due to her history of liver failure. Her liver has been stable for years on this medication.       Previous HPI 03/28/2023 Lindsay Walsh is a 53 y.o. female here for follow up for seronegative inflammatory arthritis on hydroxychloroquine  400 mg daily also on azathioprine  50 mg daily for autoimmune hepatitis.  Overall feels like her arthritis problems are doing pretty well except with right knee pain.  This is aggravating more on some days especially like with weather change and is worse during movements requiring deep bending and getting up from low positions or climbing stairs.  Carpal tunnel symptoms are still doing better she is noticing some partial increase coming back recently.  She saw Dr. Gillie had MRI of the lumbar spine there was facet arthropathy noted and has follow-up scheduled may be pursuing local injections for this soon.     Previous HPI 09/25/2022 Lindsay Walsh is a 53 y.o. female here for follow up for seronegative inflammatory arthritis on hydroxychloroquine  400 mg daily.  Her hand and wrist symptoms have remained well improved after steroid injection in September.  However she is having more trouble with some joint pains predominantly on the left side.  Left shoulder is causing pain for months that is persistent she cannot raise it above horizontal without significant pain and frequently gets abrupt pain and awakens her from sleep if she rolls onto the left side at night. She had this evaluated including xray last month and was told it showed calcification. Referred to PT but has not been able to get any scheduled with her insurance so far. In the past 1 month is having more left hip pain on the side of the hip also gets worse with pressure on that area but also notices it just getting up and down from seated positions not as often while walking.  She has had some persistent bruises on the right arm present for several months  neither resolving nor expanding.  She also had heart catheterization with diagnoses of coronary artery disease started on Repatha.  She is scheduled for ophthalmology follow-up next month with [facility].   Previous HPI 03/20/22 MADELENE KAATZ is a 53 y.o. female here for follow up for suspected seronegative arthritis with positive RNP Abs and autoimmune hepatitis. She started taking hydroxychloroquine  200 mg daily for this and feels her symptoms are overall somewhat improved. She still has numbness and joint pains in bilateral hands. Not seeing significant swelling and no rashes. Labs reviewed from 8/21 at [facility] including CBC and LFTs were normal.   Previous HPI 11/16/2021 ELSE HABERMANN is a 53 y.o. female here for follow up for abnormal lab findings and continued ongoing joint pain in multiple areas.  After our last visit did not recommend any specific new anti-inflammatory medication.  She feels overall symptoms are doing worse compared to a year ago.  She has pain pretty much all over.  Some days she feels a sensitivity and pain other times feels like she has more numbness or diminished sensation throughout.  She is getting swelling in her hands and feet that is worse by the end of the day.  She keeps persistent fatigue usually regardless of her sleep quality.  Mixed constipation and diarrhea symptoms.  Repeat laboratory testing by her pain management provider concerning for worsening lab markers for connective tissue disease or inflammation.  Liver function test been remaining stable on Imuran .   Previous HPI 06/16/2020 SHARELL HILMER is a 53 y.o. female here for follow up with hand pain and sometimes all over pain, positive RNP antibodies here for repeat assessment for inflammatory joint pain, nailfold capillaroscopy, and positive RNP test. She continues to have pain in multiple places including her hands, knees, also neck and upper back. No signficant changes since her initial visit.     Review  of Systems  Constitutional:  Positive for fatigue.  HENT:  Positive for mouth dryness. Negative for mouth sores.   Eyes:  Positive for dryness.  Respiratory:  Positive for shortness of breath.   Cardiovascular:  Positive for chest pain and palpitations.  Gastrointestinal:  Positive for diarrhea. Negative for blood in stool and constipation.  Endocrine: Negative for increased urination.  Genitourinary:  Negative for involuntary urination.  Musculoskeletal:  Positive for joint pain, gait problem, joint pain, joint swelling, myalgias, muscle weakness, morning stiffness, muscle tenderness and myalgias.  Skin:  Positive for hair loss. Negative for color change, rash and sensitivity to sunlight.  Allergic/Immunologic: Positive for susceptible to infections.  Neurological:  Positive for dizziness. Negative for headaches.  Hematological:  Negative for swollen glands.  Psychiatric/Behavioral:  Positive for depressed mood and sleep disturbance. The patient is nervous/anxious.     PMFS History:  Patient Active Problem List   Diagnosis Date Noted   Abdominal bloating 10/10/2023   Pain in right knee 03/28/2023   Diabetes mellitus without complication (HCC) 03/27/2023   Liver fibrosis 03/02/2023   Pain in left shoulder 09/25/2022   Progressive angina (HCC)    Coronary artery calcification seen on CT scan    History of nuclear stress test    Abnormal nuclear stress test 04/11/2022   Atypical angina 04/11/2022   Bilateral carpal tunnel syndrome 04/05/2022   Positive ANA (antinuclear antibody) 11/16/2021   History of colonic polyps 11/15/2021   Bilateral hand pain 06/02/2020   OAB (overactive bladder) 11/05/2019   Recurrent UTI 11/05/2019   Diabetic gastroparesis (HCC) 08/21/2019   Diarrhea 07/15/2019   Abnormal finding on urinalysis 07/15/2019   Morbid (severe) obesity due to excess calories (HCC)    Major depressive disorder, single episode, unspecified    Restless legs syndrome    Sleep  apnea    Fibromyalgia    Hereditary and idiopathic neuropathy, unspecified    Nonspecific mesenteric lymphadenitis    Pain in right foot    Type 2 diabetes mellitus with hyperglycemia (HCC)    Hypothyroidism 08/22/2018   DM type 2 causing vascular disease (HCC) 08/21/2018   Hyperlipidemia associated with type 2 diabetes mellitus (HCC) 08/21/2018   Essential hypertension, benign 08/21/2018   Influenza A 08/10/2018   SIRS (systemic inflammatory response syndrome) (HCC) 08/10/2018   Cerebral infarction (HCC) 03/05/2015   Dyspareunia 05/06/2014   Obesity 09/30/2013   Bilateral hip pain 07/16/2013   Chronic pain syndrome 07/16/2013   Degenerative disc disease, lumbar 07/16/2013   Greater trochanteric bursitis of  both hips 07/16/2013   Nightmares associated with chronic post-traumatic stress disorder 10/18/2012   Sprain of foot 03/14/2012   Weakness 01/29/2012   Irritable bowel syndrome with diarrhea 08/01/2011   Gastroesophageal reflux disease    Depression    Chest pain    Fasting hyperglycemia    TOBACCO ABUSE 04/28/2010   NARCOTIC ABUSE 04/28/2010   PULMONARY NODULE 04/28/2010   Autoimmune hepatitis (HCC) 04/28/2010   Myalgia and myositis 01/06/2009    Past Medical History:  Diagnosis Date   Acid reflux    Allergic rhinitis due to pollen    Anxiety    Arthritis    back   Autoimmune hepatitis (HCC)    Back pain    Connective tissue disease    Depression    Dizziness and giddiness    Dyspareunia 05/06/2014   Dysrhythmia    palpatations; on propafenone   Essential (primary) hypertension    Fibromyalgia    Gastroesophageal reflux disease    Chronic abdominal pain; gastroparesis; globus hystericus; irritable bowel syndrome   Glaucoma    Hereditary and idiopathic neuropathy, unspecified    Hyperlipidemia    Irritable bowel syndrome    Major depressive disorder, single episode, unspecified    Morbid (severe) obesity due to excess calories (HCC)    Narcotic dependence  (HCC)    Nonspecific mesenteric lymphadenitis    Overweight(278.02)    Pain in limb    Pain in right foot    PTSD (post-traumatic stress disorder)    Pulmonary nodule    Restless legs syndrome    Sleep apnea    on CPAP machine   Stroke (HCC) 2016   Systemic lupus erythematosus (SLE) in adult [facility])    Tobacco abuse    one pack per day; 35 pack years; quit in February 2023   Type 2 diabetes mellitus with hyperglycemia (HCC)     Family History  Problem Relation Age of Onset   Diabetes Mother    Hypertension Mother    Anxiety disorder Mother    Depression Mother    Drug abuse Mother    Heart disease Father    Diabetes Father    Anxiety disorder Father    Alcohol abuse Father    Depression Father    OCD Father    Thyroid  disease Sister    Healthy Brother    Healthy Daughter    Healthy Son    ADD / ADHD Son    Alcohol abuse Paternal Grandfather    Depression Paternal Grandfather    Cancer Paternal Grandfather        lung,skin   Tuberculosis Paternal Grandfather    Seizures Paternal Grandmother    Lupus Paternal Grandmother    ADD / ADHD Son    Anesthesia problems Neg Hx    Malignant hyperthermia Neg Hx    Pseudochol deficiency Neg Hx    Hypotension Neg Hx    Bipolar disorder Neg Hx    Dementia Neg Hx    Paranoid behavior Neg Hx    Schizophrenia Neg Hx    Sexual abuse Neg Hx    Physical abuse Neg Hx    Past Surgical History:  Procedure Laterality Date   ABDOMINAL HYSTERECTOMY     TAH&BSO   BACK SURGERY     BLADDER SUSPENSION  09/12/2011   Procedure: TRANSVAGINAL TAPE (TVT) PROCEDURE;  Surgeon: Mohammad I Javaid, MD;  Location: [facility];  Service: Urology;  Laterality: N/A;   CESAREAN SECTION     X3  CHOLECYSTECTOMY     CORONARY PRESSURE/FFR STUDY N/A 04/21/2022   Procedure: INTRAVASCULAR PRESSURE WIRE/FFR STUDY;  Surgeon: Anner Alm ORN, MD;  Location: [facility] INVASIVE CV LAB;  Service: Cardiovascular;  Laterality: N/A;   ESOPHAGOGASTRODUODENOSCOPY ENDOSCOPY      throat stretched per pt.   LEFT HEART CATH AND CORONARY ANGIOGRAPHY N/A 04/21/2022   Procedure: LEFT HEART CATH AND CORONARY ANGIOGRAPHY;  Surgeon: Anner Alm ORN, MD;  Location: [facility] INVASIVE CV LAB;  Service: Cardiovascular;  Laterality: N/A;   LIVER BIOPSY     x4   LUMBAR EPIDURAL INJECTION  01/2012   TOTAL ABDOMINAL HYSTERECTOMY W/ BILATERAL SALPINGOOPHORECTOMY     TUBAL LIGATION     Bilateral   UPPER GASTROINTESTINAL ENDOSCOPY  09/13/2010   Social History   Social History Narrative   Divorced since 1994.Lives with boyfriend of 11 years.On disability.   Immunization History  Administered Date(s) Administered   Hepatitis A, Adult 06/24/2020, 02/27/2022   Influenza Inj Mdck Quad Pf 04/20/2016   Influenza Split 04/07/2013, 05/17/2017   Influenza,inj,Quad PF,6+ Mos 05/17/2017, 04/04/2018, 03/31/2019, 04/28/2019   Influenza-Unspecified 04/17/2012, 03/20/2014, 08/09/2014, 05/06/2015, 06/24/2020   Moderna Sars-Covid-2 Vaccination 02/06/2020, 03/05/2020   Pneumococcal Conjugate-13 12/18/2019   Pneumococcal Polysaccharide-23 04/20/2016     Objective: Vital Signs: BP 121/84   Pulse 88   Temp 97.6 F (36.4 C)   Resp 16   Ht 4' 11 (1.499 m)   Wt 200 lb (90.7 kg)   BMI 40.40 kg/m    Physical Exam Eyes:     Conjunctiva/sclera: Conjunctivae normal.  Cardiovascular:     Rate and Rhythm: Normal rate and regular rhythm.  Pulmonary:     Effort: Pulmonary effort is normal.     Breath sounds: Normal breath sounds.  Skin:    General: Skin is warm and dry.  Neurological:     Mental Status: She is alert.  Psychiatric:        Mood and Affect: Mood normal.      Musculoskeletal Exam:  Shoulders full ROM some pain with overhead abduction, no swelling or tenderness to palpation Elbows full ROM no tenderness or swelling Painful to percussion over flexor side of both wrists, with some radiation into fingers Fingers full ROM no tenderness to pressure over the MCP joints of both  hands but no palpable swelling Left lateral hip tenderness to pressure Right knee patellofemoral crepitus, no effusions, anterior joint line tenderness to pressure, left knee joint line tenderness to pressure and with full flexion/extension  Investigation: No additional findings.  Imaging: XR HIP UNILAT W OR W/O PELVIS 2-3 VIEWS RIGHT Result Date: 07/18/2024 Radiographs of her hips some sclerotic changes no asymmetry no evidence of fracture   Recent Labs: Lab Results  Component Value Date   WBC 8.3 07/14/2024   HGB 13.7 07/14/2024   PLT 254 07/14/2024   NA 139 07/14/2024   K 4.8 07/14/2024   CL 104 07/14/2024   CO2 26 07/14/2024   GLUCOSE 116 (H) 07/14/2024   BUN 7 07/14/2024   CREATININE 0.83 07/14/2024   BILITOT 0.5 07/14/2024   ALKPHOS 105 03/06/2023   AST 23 07/14/2024   ALT 20 07/14/2024   PROT 7.0 07/14/2024   ALBUMIN 4.1 03/06/2023   CALCIUM  9.3 07/14/2024   GFRAA 101 08/03/2020    Speciality Comments: PLQ Eye Exam 07/23/2024 normal Dr. Octavia f/u 6 months.  Procedures:  No procedures performed Allergies: Doxycycline, Fentanyl, Cefoxitin, Ceftin [cefuroxime axetil], Iodinated contrast media, Iohexol , Norvasc [amlodipine besylate], Trintellix  [vortioxetine ], Wellbutrin [bupropion  hcl], Latex, and Tape   Assessment / Plan:     Visit Diagnoses: Positive ANA (antinuclear antibody) Bilateral hand pain - Plan: hydroxychloroquine  (PLAQUENIL ) 200 MG tablet, Sedimentation rate Recent exacerbation of knee pain, particularly the left knee, with swelling in fingers. Suspect more related to her underlying left knee OA, and some compensatory hip pain as well. - Recheck sed rate for inflammatory disease activity monitoring - Continue hydroxychloroquine  400 mg daily - On azathioprine  50 mg daily, could try discontinuing if not felt important by hepatology at this time - Held off on knee injections unless pain worsens significantly.  High risk medication use - hydroxychloroquine   400 mg daily - Plan: CBC with Differential/Platelet, Comprehensive metabolic panel with GFR Tolerating medication well without new complaints.  No serious interval infections.  - Checking CBC and CMP for medication monitoring on continued long-term use of hydroxychloroquine  and azathioprine   Autoimmune hepatitis Liver enzymes were normal in September. - Ordered blood work to recheck liver enzymes.  Right hip pain Recent onset of right hip pain with an episode of hip locking. - Recommended exercises such as hip hikes and touchdowns to strengthen hip muscles.  Nonscarring hair loss Reports thinning hair and hair loss, especially after washing. - Recommended trying addition biotin and collagen peptides supplements.       Orders: Orders Placed This Encounter  Procedures   Sedimentation rate   CBC with Differential/Platelet   Comprehensive metabolic panel with GFR   Meds ordered this encounter  Medications   hydroxychloroquine  (PLAQUENIL ) 200 MG tablet    Sig: Take 1 tablet (200 mg total) by mouth 2 (two) times daily.    Dispense:  180 tablet    Refill:  1     Follow-Up Instructions: Return in about 3 months (around 10/12/2024) for RA/AIH on HCQ/AZA f/u 3mos.   Lonni LELON Ester, MD  Note - This record has been created using [employer].  Chart creation errors have been sought, but may not always  have been located. Such creation errors do not reflect on  the standard of medical care. "

## 2024-07-09 ENCOUNTER — Encounter: Admitting: Physician Assistant

## 2024-07-14 ENCOUNTER — Ambulatory Visit: Admitting: Internal Medicine

## 2024-07-14 ENCOUNTER — Encounter: Payer: Self-pay | Admitting: Internal Medicine

## 2024-07-14 VITALS — BP 121/84 | HR 88 | Temp 97.6°F | Resp 16 | Ht 59.0 in | Wt 200.0 lb

## 2024-07-14 DIAGNOSIS — M79641 Pain in right hand: Secondary | ICD-10-CM | POA: Insufficient documentation

## 2024-07-14 DIAGNOSIS — R7689 Other specified abnormal immunological findings in serum: Secondary | ICD-10-CM | POA: Diagnosis not present

## 2024-07-14 DIAGNOSIS — M79642 Pain in left hand: Secondary | ICD-10-CM | POA: Insufficient documentation

## 2024-07-14 DIAGNOSIS — Z79899 Other long term (current) drug therapy: Secondary | ICD-10-CM | POA: Diagnosis not present

## 2024-07-14 DIAGNOSIS — K754 Autoimmune hepatitis: Secondary | ICD-10-CM | POA: Diagnosis not present

## 2024-07-14 MED ORDER — HYDROXYCHLOROQUINE SULFATE 200 MG PO TABS: 200.0000 mg | ORAL_TABLET | Freq: Two times a day (BID) | ORAL | 1 refills | Status: AC

## 2024-07-14 NOTE — Patient Instructions (Signed)
 Collagen peptide supplements can help with hair, nail, and skin health by providing extra building blocks for these tissues. This is available through many brands usually as a powder additive to any drink.    Strengthening exercises Single leg dip  Stand between two chairs and put both hands on the backs of the chairs for support. Extend one leg out straight with your body weight resting on the heel of the standing leg. Slowly bend your standing knee to dip your body to the level that is comfortable for you. Hold for 3-5 seconds. Repeat __________ times. Complete this exercise __________ times a day. Hamstring curls  Stand straight, knees close together, facing the back of a chair. Hold on to the back of a chair with both hands. Keep one leg straight. Bend the other knee while bringing the heel up toward the butt until the knee is bent at a 90-degree angle (right angle). Hold for 3-5 seconds. Repeat __________ times. Complete this exercise __________ times a day. Wall squat  Stand straight with your back, hips, and head against a wall. Step forward one foot at a time with your back still against the wall. Your feet should be 2 feet (61 cm) from the wall at shoulder width. Keeping your back, hips, and head against the wall, slide down the wall to as close to a sitting position as you can get. Hold for 5-10 seconds, then slowly slide back up. Repeat __________ times. Complete this exercise __________ times a day. Step-ups  Stand in front of a sturdy platform or stool that is about 6 inches (15 cm) high. Slowly step up with your left / right foot, keeping your knee in line with your hip and foot. Do not let your knee bend so far that you cannot see your toes. Hold on to a chair for balance, but do not use it for support. Slowly unlock your knee and lower yourself to the starting position. Repeat __________ times. Complete this exercise __________ times a day.

## 2024-07-15 ENCOUNTER — Ambulatory Visit (INDEPENDENT_AMBULATORY_CARE_PROVIDER_SITE_OTHER): Admitting: Clinical

## 2024-07-15 DIAGNOSIS — F419 Anxiety disorder, unspecified: Secondary | ICD-10-CM

## 2024-07-15 DIAGNOSIS — F331 Major depressive disorder, recurrent, moderate: Secondary | ICD-10-CM

## 2024-07-15 DIAGNOSIS — F4310 Post-traumatic stress disorder, unspecified: Secondary | ICD-10-CM

## 2024-07-15 DIAGNOSIS — F900 Attention-deficit hyperactivity disorder, predominantly inattentive type: Secondary | ICD-10-CM

## 2024-07-15 LAB — CBC WITH DIFFERENTIAL/PLATELET
Absolute Lymphocytes: 1295 {cells}/uL (ref 850–3900)
Absolute Monocytes: 855 {cells}/uL (ref 200–950)
Basophils Absolute: 58 {cells}/uL (ref 0–200)
Basophils Relative: 0.7 %
Eosinophils Absolute: 183 {cells}/uL (ref 15–500)
Eosinophils Relative: 2.2 %
HCT: 42.3 % (ref 35.9–46.0)
Hemoglobin: 13.7 g/dL (ref 11.7–15.5)
MCH: 29.3 pg (ref 27.0–33.0)
MCHC: 32.4 g/dL (ref 31.6–35.4)
MCV: 90.4 fL (ref 81.4–101.7)
MPV: 10.4 fL (ref 7.5–12.5)
Monocytes Relative: 10.3 %
Neutro Abs: 5910 {cells}/uL (ref 1500–7800)
Neutrophils Relative %: 71.2 %
Platelets: 254 Thousand/uL (ref 140–400)
RBC: 4.68 Million/uL (ref 3.80–5.10)
RDW: 12.5 % (ref 11.0–15.0)
Total Lymphocyte: 15.6 %
WBC: 8.3 Thousand/uL (ref 3.8–10.8)

## 2024-07-15 LAB — COMPREHENSIVE METABOLIC PANEL WITH GFR
AG Ratio: 1.5 (calc) (ref 1.0–2.5)
ALT: 20 U/L (ref 6–29)
AST: 23 U/L (ref 10–35)
Albumin: 4.2 g/dL (ref 3.6–5.1)
Alkaline phosphatase (APISO): 92 U/L (ref 37–153)
BUN: 7 mg/dL (ref 7–25)
CO2: 26 mmol/L (ref 20–32)
Calcium: 9.3 mg/dL (ref 8.6–10.4)
Chloride: 104 mmol/L (ref 98–110)
Creat: 0.83 mg/dL (ref 0.50–1.03)
Globulin: 2.8 g/dL (ref 1.9–3.7)
Glucose, Bld: 116 mg/dL — ABNORMAL HIGH (ref 65–99)
Potassium: 4.8 mmol/L (ref 3.5–5.3)
Sodium: 139 mmol/L (ref 135–146)
Total Bilirubin: 0.5 mg/dL (ref 0.2–1.2)
Total Protein: 7 g/dL (ref 6.1–8.1)
eGFR: 84 mL/min/1.73m2

## 2024-07-15 LAB — SEDIMENTATION RATE: Sed Rate: 9 mm/h (ref 0–30)

## 2024-07-15 NOTE — Progress Notes (Signed)
 Virtual Visit via Video Note   I connected with Lindsay Walsh on 07/15/24 at  3:00 PM EDT by a video enabled telemedicine application and verified that I am speaking with the correct person using two identifiers.   Location: Patient: home Provider: office   I discussed the limitations of evaluation and management by telemedicine and the availability of in person appointments. The patient expressed understanding and agreed to proceed.     THERAPIST PROGRESS NOTE   Session Time: 3:00 PM-3:30 PM   Participation Level: Active   Behavioral Response: CasualAlertDepressed   Type of Therapy: Individual Therapy   Treatment Goals addressed: Coping for MH diagnoses   Interventions: CBT   Summary: Lindsay Walsh is a 54 y.o. female who presents with Depression, ADHD, PTSD  . The OPT therapist worked with the patient for her ongoing OPT treatment session. The OPT therapist utilized Motivational Interviewing to assist in creating therapeutic repore. The patient in the session was engaged and work in collaboration giving feedback about her triggers and symptoms over the past few weeks. The patient spoke about her interaction with family and recent weather changes. The patient spoke about  med therapy and feeling this continues to do well and helpful in management of her MH.The patient spoke about her experiences through the holidays Thanksgiving and Christmas spending time with family. The OPT therapist utilized Cognitive Behavioral Therapy through cognitive restructuring as well as worked with the patient on coping strategies to assist in management of mood. The OPT therapist worked with the patient on management of her Anxiety and ensuring she is being mindful of her basic needs. The patient spoke about her ongoing work to manage interactions and social situations.   Suicidal/Homicidal: Nowithout intent/plan   Therapist Response: The OPT therapist worked with the patient for the patients  scheduled session. The patient was engaged in her session and gave feedback in relation to triggers, symptoms, and behavior responses over the past few weeks. The OPT therapist worked with the patient utilizing an in session Cognitive Behavioral Therapy exercise. The patient was responsive in the session and verbalized, I had  good holidays with Thanksgiving and Christmas and I am doing better with being around people..  The patient spoke about having a lot a lot of empathy for others but acknowledging needing to stop putting herself on the back burner. The patient spoke about her new years resolution to do 1 thing per day for herself. The OPT therapist worked with the patient on placing her energy/focus/and attention in areas of her life she has control over and doing her self check ins throughout the week. The OPT therapist promoted the patient being active within her physical health limitations and change environment when possible. The patient spoke about her goal of working on her health. The OPT therapist will continue treatment work with the patient in her next scheduled session   Plan: Return again in 3 weeks.   Diagnosis       Axis I: Recurrent Moderate MDD with anxiety, ADHD inattentive type, PTSD.                             Axis II: No diagnosis     Collaboration of Care: Overview of patient involvement with Dr. Okey in the med therapy program .   Patient/Guardian was advised Release of Information must be obtained prior to any record release in order to collaborate their care with an outside  provider. Patient/Guardian was advised if they have not already done so to contact the registration department to sign all necessary forms in order for us  to release information regarding their care.    Consent: Patient/Guardian gives verbal consent for treatment and assignment of benefits for services provided during this visit. Patient/Guardian expressed understanding and agreed to proceed.    I  discussed the assessment and treatment plan with the patient. The patient was provided an opportunity to ask questions and all were answered. The patient agreed with the plan and demonstrated an understanding of the instructions.   The patient was advised to call back or seek an in-person evaluation if the symptoms worsen or if the condition fails to improve as anticipated.   I provided 30 minutes of non-face-to-face time during this encounter.   Lindsay ONEIDA Pepper, LCSW   07/15/2024

## 2024-07-15 NOTE — Telephone Encounter (Signed)
 Patient left vm wanting to schedule colonoscopy. This was a Dr. Eartha pt.  Dr. Cinderella, is it okay to schedule the same? TCS, any room, 2 day prep?

## 2024-07-15 NOTE — Telephone Encounter (Signed)
 Yes proceed with scheduling , extended bowel prep ( 10 days miralax twice daily and 1 day of full bowel prep )

## 2024-07-16 ENCOUNTER — Other Ambulatory Visit (HOSPITAL_COMMUNITY): Payer: Self-pay | Admitting: Psychiatry

## 2024-07-16 DIAGNOSIS — F4312 Post-traumatic stress disorder, chronic: Secondary | ICD-10-CM

## 2024-07-18 ENCOUNTER — Other Ambulatory Visit

## 2024-07-18 ENCOUNTER — Ambulatory Visit: Admitting: Physician Assistant

## 2024-07-18 ENCOUNTER — Encounter: Payer: Self-pay | Admitting: Physician Assistant

## 2024-07-18 DIAGNOSIS — M25551 Pain in right hip: Secondary | ICD-10-CM

## 2024-07-18 NOTE — Progress Notes (Signed)
 "  Office Visit Note   Patient: Lindsay Walsh           Date of Birth: [birth year]           MRN: [id number] Visit Date: 07/18/2024              Requested by: Kahoano, Haku K, MD [address] [place],  [place] [address] PCP: Kahoano, Haku K, MD   Assessment & Plan: Visit Diagnoses:  1. Pain of right hip     Plan: Patient is a pleasant 54 year old woman who comes in with a 20-month history of right groin pain.  She does not remember doing anything but has had a couple episodes of her hip locking up when she attempts to flex her hip.  She is already on chronic pain management.  She denies any fever chills or previous history.  She denies any injury.  I would like for her to try some physical therapy for 6 weeks.  If it gets worse while she is doing therapy she could contact me but if it does not get better after 6 weeks of therapy would refer her for an MRI  Follow-Up Instructions: Return in about 6 weeks (around 08/29/2024).   Orders:  Orders Placed This Encounter  Procedures   XR HIP UNILAT W OR W/O PELVIS 2-3 VIEWS RIGHT   No orders of the defined types were placed in this encounter.     Procedures: No procedures performed   Clinical Data: No additional findings.   Subjective: Chief Complaint  Patient presents with   Right Hip - Pain    HPI pleasant 54 year old woman comes in today for right groin pain for couple months.  Denies any injury.  Denies any radicular findings any numbing tingling states that pain is in her right groin has had a couple episodes of her hip locking up no real treatment she does get chronic pain management  Review of Systems  All other systems reviewed and are negative.    Objective: Vital Signs: There were no vitals taken for this visit.  Physical Exam Constitutional:      Appearance: Normal appearance.  Pulmonary:     Effort: Pulmonary effort is normal.  Skin:    General: Skin is warm and dry.  Neurological:     General:  No focal deficit present.     Mental Status: She is alert and oriented to person, place, and time.  Psychiatric:        Mood and Affect: Mood normal.     Ortho Exam Right hip she is neurovascular intact no erythema she has good strength with resisted extension and flexion of her leg negative straight leg sign good dorsiflexion plantarflexion of her ankle.  No pain with external rotation of the hip but does have pain with internal rotation of the hip reproduced in the groin.  No lateral sided hip pain has good range of motion Specialty Comments:  CLINICAL DATA:  Chronic neck pain   EXAM: MRI CERVICAL SPINE WITHOUT CONTRAST   TECHNIQUE: Multiplanar, multisequence MR imaging of the cervical spine was performed. No intravenous contrast was administered.   COMPARISON:  Plain film August 08, 2019   FINDINGS: The craniocervical junction is normal.   There is no significant bone marrow signal abnormality.   The cervical spinal cord is normal.   C2-C3: Normal   C3-C4: The disc is normal. There is severe facet arthropathy on the left with edema in the articular processes. There is mild  left neural foraminal stenosis   C4-C5: The disc is normal. There is severe facet arthropathy on the left. There is mild left neural foraminal stenosis   C5-C6: The disc is normal. There is moderate left facet arthropathy. No spinal stenosis or foraminal stenosis   C6-C7: Minimal disc bulge.  No significant facet disease   C7-T1: Normal   IMPRESSION: 1. Severe facet arthropathy on the left at C3-4 with edema in the articular processes. Severe left facet arthropathy at C4-5 and moderate left facet arthropathy at C5-6     Electronically Signed   By: Nancyann Burns M.D.   On: 02/18/2024 09:42  Imaging: XR HIP UNILAT W OR W/O PELVIS 2-3 VIEWS RIGHT Result Date: 07/18/2024 Radiographs of her hips some sclerotic changes no asymmetry no evidence of fracture    PMFS History: Patient Active  Problem List   Diagnosis Date Noted   Abdominal bloating 10/10/2023   Pain in right knee 03/28/2023   Diabetes mellitus without complication (HCC) 03/27/2023   Liver fibrosis 03/02/2023   Pain in left shoulder 09/25/2022   Progressive angina (HCC)    Coronary artery calcification seen on CT scan    History of nuclear stress test    Abnormal nuclear stress test 04/11/2022   Atypical angina 04/11/2022   Bilateral carpal tunnel syndrome 04/05/2022   Positive ANA (antinuclear antibody) 11/16/2021   History of colonic polyps 11/15/2021   Bilateral hand pain 06/02/2020   OAB (overactive bladder) 11/05/2019   Recurrent UTI 11/05/2019   Diabetic gastroparesis (HCC) 08/21/2019   Diarrhea 07/15/2019   Abnormal finding on urinalysis 07/15/2019   Morbid (severe) obesity due to excess calories (HCC)    Major depressive disorder, single episode, unspecified    Restless legs syndrome    Sleep apnea    Fibromyalgia    Hereditary and idiopathic neuropathy, unspecified    Nonspecific mesenteric lymphadenitis    Pain in right foot    Type 2 diabetes mellitus with hyperglycemia (HCC)    Hypothyroidism 08/22/2018   DM type 2 causing vascular disease (HCC) 08/21/2018   Hyperlipidemia associated with type 2 diabetes mellitus (HCC) 08/21/2018   Essential hypertension, benign 08/21/2018   Influenza A 08/10/2018   SIRS (systemic inflammatory response syndrome) (HCC) 08/10/2018   Cerebral infarction (HCC) 03/05/2015   Dyspareunia 05/06/2014   Obesity 09/30/2013   Bilateral hip pain 07/16/2013   Chronic pain syndrome 07/16/2013   Degenerative disc disease, lumbar 07/16/2013   Greater trochanteric bursitis of both hips 07/16/2013   Nightmares associated with chronic post-traumatic stress disorder 10/18/2012   Sprain of foot 03/14/2012   Weakness 01/29/2012   Irritable bowel syndrome with diarrhea 08/01/2011   Gastroesophageal reflux disease    Depression    Chest pain    Fasting hyperglycemia     TOBACCO ABUSE 04/28/2010   NARCOTIC ABUSE 04/28/2010   PULMONARY NODULE 04/28/2010   Autoimmune hepatitis (HCC) 04/28/2010   Myalgia and myositis 01/06/2009   Past Medical History:  Diagnosis Date   Acid reflux    Allergic rhinitis due to pollen    Anxiety    Arthritis    back   Autoimmune hepatitis (HCC)    Back pain    Connective tissue disease    Depression    Dizziness and giddiness    Dyspareunia 05/06/2014   Dysrhythmia    palpatations; on propafenone   Essential (primary) hypertension    Fibromyalgia    Gastroesophageal reflux disease    Chronic abdominal pain; gastroparesis; globus hystericus;  irritable bowel syndrome   Glaucoma    Hereditary and idiopathic neuropathy, unspecified    Hyperlipidemia    Irritable bowel syndrome    Major depressive disorder, single episode, unspecified    Morbid (severe) obesity due to excess calories (HCC)    Narcotic dependence (HCC)    Nonspecific mesenteric lymphadenitis    Overweight(278.02)    Pain in limb    Pain in right foot    PTSD (post-traumatic stress disorder)    Pulmonary nodule    Restless legs syndrome    Sleep apnea    on CPAP machine   Stroke (HCC) 2016   Systemic lupus erythematosus (SLE) in adult [facility])    Tobacco abuse    one pack per day; 35 pack years; quit in February 2023   Type 2 diabetes mellitus with hyperglycemia (HCC)     Family History  Problem Relation Age of Onset   Diabetes Mother    Hypertension Mother    Anxiety disorder Mother    Depression Mother    Drug abuse Mother    Heart disease Father    Diabetes Father    Anxiety disorder Father    Alcohol abuse Father    Depression Father    OCD Father    Thyroid  disease Sister    Healthy Brother    Healthy Daughter    Healthy Son    ADD / ADHD Son    Alcohol abuse Paternal Grandfather    Depression Paternal Grandfather    Cancer Paternal Grandfather        lung,skin   Tuberculosis Paternal Grandfather    Seizures Paternal  Grandmother    Lupus Paternal Grandmother    ADD / ADHD Son    Anesthesia problems Neg Hx    Malignant hyperthermia Neg Hx    Pseudochol deficiency Neg Hx    Hypotension Neg Hx    Bipolar disorder Neg Hx    Dementia Neg Hx    Paranoid behavior Neg Hx    Schizophrenia Neg Hx    Sexual abuse Neg Hx    Physical abuse Neg Hx     Past Surgical History:  Procedure Laterality Date   ABDOMINAL HYSTERECTOMY     TAH&BSO   BACK SURGERY     BLADDER SUSPENSION  09/12/2011   Procedure: TRANSVAGINAL TAPE (TVT) PROCEDURE;  Surgeon: Emery LILLETTE Blaze, MD;  Location: [facility];  Service: Urology;  Laterality: N/A;   CESAREAN SECTION     X3   CHOLECYSTECTOMY     CORONARY PRESSURE/FFR STUDY N/A 04/21/2022   Procedure: INTRAVASCULAR PRESSURE WIRE/FFR STUDY;  Surgeon: Anner Alm ORN, MD;  Location: [facility] INVASIVE CV LAB;  Service: Cardiovascular;  Laterality: N/A;   ESOPHAGOGASTRODUODENOSCOPY ENDOSCOPY     throat stretched per pt.   LEFT HEART CATH AND CORONARY ANGIOGRAPHY N/A 04/21/2022   Procedure: LEFT HEART CATH AND CORONARY ANGIOGRAPHY;  Surgeon: Anner Alm ORN, MD;  Location: [facility] INVASIVE CV LAB;  Service: Cardiovascular;  Laterality: N/A;   LIVER BIOPSY     x4   LUMBAR EPIDURAL INJECTION  01/2012   TOTAL ABDOMINAL HYSTERECTOMY W/ BILATERAL SALPINGOOPHORECTOMY     TUBAL LIGATION     Bilateral   UPPER GASTROINTESTINAL ENDOSCOPY  09/13/2010   Social History   Occupational History    Employer: NOT EMPLOYED  Tobacco Use   Smoking status: Former    Current packs/day: 0.00    Average packs/day: 1.5 packs/day for 25.0 years (37.5 ttl pk-yrs)    Types: Cigarettes  Start date: 04/25/1988    Quit date: 04/25/2013    Years since quitting: 11.2    Passive exposure: Never   Smokeless tobacco: Never  Vaping Use   Vaping status: Every Day   Start date: 04/22/2013   Substances: Nicotine , Flavoring  Substance and Sexual Activity   Alcohol use: No   Drug use: No   Sexual activity: Yes     Birth control/protection: Surgical, Abstinence        "

## 2024-07-23 ENCOUNTER — Encounter (HOSPITAL_COMMUNITY): Payer: Self-pay

## 2024-07-23 LAB — OPHTHALMOLOGY REPORT-SCANNED: A Comment: NORMAL

## 2024-07-24 ENCOUNTER — Telehealth (HOSPITAL_COMMUNITY): Admitting: Psychiatry

## 2024-07-24 MED ORDER — PEG 3350-KCL-NA BICARB-NACL 420 G PO SOLR: 4000.0000 mL | Freq: Once | ORAL | 0 refills | Status: AC

## 2024-07-24 NOTE — Addendum Note (Signed)
 Addended by: DALLIE LIONEL RAMAN on: 07/24/2024 02:30 PM   Modules accepted: Orders

## 2024-07-24 NOTE — Telephone Encounter (Signed)
 Prior shara is not required

## 2024-07-24 NOTE — Telephone Encounter (Signed)
 Spoke with patient, scheduled TCS for 08/14/2024 at 10am. Rx sent to pharmacy. Instructions mailed.

## 2024-07-31 ENCOUNTER — Telehealth (HOSPITAL_COMMUNITY): Admitting: Psychiatry

## 2024-07-31 ENCOUNTER — Encounter (HOSPITAL_COMMUNITY): Payer: Self-pay | Admitting: Psychiatry

## 2024-07-31 DIAGNOSIS — F331 Major depressive disorder, recurrent, moderate: Secondary | ICD-10-CM

## 2024-07-31 DIAGNOSIS — F515 Nightmare disorder: Secondary | ICD-10-CM | POA: Diagnosis not present

## 2024-07-31 DIAGNOSIS — F419 Anxiety disorder, unspecified: Secondary | ICD-10-CM | POA: Diagnosis not present

## 2024-07-31 DIAGNOSIS — F4310 Post-traumatic stress disorder, unspecified: Secondary | ICD-10-CM

## 2024-07-31 DIAGNOSIS — F900 Attention-deficit hyperactivity disorder, predominantly inattentive type: Secondary | ICD-10-CM | POA: Diagnosis not present

## 2024-07-31 MED ORDER — AMPHETAMINE-DEXTROAMPHETAMINE 20 MG PO TABS: 20.0000 mg | ORAL_TABLET | Freq: Two times a day (BID) | ORAL | 0 refills | Status: AC

## 2024-07-31 MED ORDER — ARIPIPRAZOLE 5 MG PO TABS: 5.0000 mg | ORAL_TABLET | Freq: Every day | ORAL | 2 refills | Status: AC

## 2024-07-31 MED ORDER — PRAZOSIN HCL 2 MG PO CAPS: ORAL_CAPSULE | ORAL | 2 refills | Status: AC

## 2024-07-31 MED ORDER — PAROXETINE HCL 40 MG PO TABS: 40.0000 mg | ORAL_TABLET | Freq: Every day | ORAL | 2 refills | Status: AC

## 2024-07-31 NOTE — Progress Notes (Signed)
 Virtual Visit via Video Note  I connected with Lindsay Walsh on 07/31/24 at  2:20 PM EST by a video enabled telemedicine application and verified that I am speaking with the correct person using two identifiers.  Location: Patient: home Provider: office   I discussed the limitations of evaluation and management by telemedicine and the availability of in person appointments. The patient expressed understanding and agreed to proceed.     I discussed the assessment and treatment plan with the patient. The patient was provided an opportunity to ask questions and all were answered. The patient agreed with the plan and demonstrated an understanding of the instructions.   The patient was advised to call back or seek an in-person evaluation if the symptoms worsen or if the condition fails to improve as anticipated.  I provided 20 minutes of non-face-to-face time during this encounter.   Barnie Gull, MD  [facility] MD/PA/NP OP Progress Note  07/31/2024 2:49 PM Lindsay Walsh  MRN:  [id number]  Chief Complaint:  Chief Complaint  Patient presents with   Anxiety   Depression   Follow-up   HPI:  This patient is a 54 year old divorced white female who lives with her boyfriend in [place]. She is on disability for an autoimmune liver disease. She has 3 children and 1 granddaughter   The patient returns for follow-up after 4 months regarding her major depression, generalized anxiety disorder PTSD and ADD.  Overall she is doing okay but she still feels tired all the time.  She does have an autoimmune disorder which probably has a lot to do with it.  In terms of mood she is okay but at times still has down periods.  They are not as frequent as they used to be since we started the Abilify .  She denies any thoughts of self-harm or suicide.  I suggested we go up a little bit more on the Abilify  and she is in agreement.  She denies any significant anxiety symptoms right now.  She is no longer has any  nightmares Visit Diagnosis:    ICD-10-CM   1. Recurrent moderate major depressive disorder with anxiety (HCC)  F33.1    F41.9     2. Nightmares associated with chronic post-traumatic stress disorder  F51.5 prazosin  (MINIPRESS ) 2 MG capsule   F43.12     3. Attention deficit hyperactivity disorder (ADHD), predominantly inattentive type  F90.0     4. PTSD (post-traumatic stress disorder)  F43.10       Past Psychiatric History: Hospitalization in her early 45s for depression  Past Medical History:  Past Medical History:  Diagnosis Date   Acid reflux    Allergic rhinitis due to pollen    Anxiety    Arthritis    back   Autoimmune hepatitis (HCC)    Back pain    Connective tissue disease    Depression    Dizziness and giddiness    Dyspareunia 05/06/2014   Dysrhythmia    palpatations; on propafenone   Essential (primary) hypertension    Fibromyalgia    Gastroesophageal reflux disease    Chronic abdominal pain; gastroparesis; globus hystericus; irritable bowel syndrome   Glaucoma    Hereditary and idiopathic neuropathy, unspecified    Hyperlipidemia    Irritable bowel syndrome    Major depressive disorder, single episode, unspecified    Morbid (severe) obesity due to excess calories (HCC)    Narcotic dependence (HCC)    Nonspecific mesenteric lymphadenitis    Overweight(278.02)  Pain in limb    Pain in right foot    PTSD (post-traumatic stress disorder)    Pulmonary nodule    Restless legs syndrome    Sleep apnea    on CPAP machine   Stroke (HCC) 2016   Systemic lupus erythematosus (SLE) in adult [facility])    Tobacco abuse    one pack per day; 35 pack years; quit in February 2023   Type 2 diabetes mellitus with hyperglycemia (HCC)     Past Surgical History:  Procedure Laterality Date   ABDOMINAL HYSTERECTOMY     TAH&BSO   BACK SURGERY     BLADDER SUSPENSION  09/12/2011   Procedure: TRANSVAGINAL TAPE (TVT) PROCEDURE;  Surgeon: Emery LILLETTE Blaze, MD;  Location: [facility];  Service: Urology;  Laterality: N/A;   CESAREAN SECTION     X3   CHOLECYSTECTOMY     CORONARY PRESSURE/FFR STUDY N/A 04/21/2022   Procedure: INTRAVASCULAR PRESSURE WIRE/FFR STUDY;  Surgeon: Anner Alm ORN, MD;  Location: [facility] INVASIVE CV LAB;  Service: Cardiovascular;  Laterality: N/A;   ESOPHAGOGASTRODUODENOSCOPY ENDOSCOPY     throat stretched per pt.   LEFT HEART CATH AND CORONARY ANGIOGRAPHY N/A 04/21/2022   Procedure: LEFT HEART CATH AND CORONARY ANGIOGRAPHY;  Surgeon: Anner Alm ORN, MD;  Location: [facility] INVASIVE CV LAB;  Service: Cardiovascular;  Laterality: N/A;   LIVER BIOPSY     x4   LUMBAR EPIDURAL INJECTION  01/2012   TOTAL ABDOMINAL HYSTERECTOMY W/ BILATERAL SALPINGOOPHORECTOMY     TUBAL LIGATION     Bilateral   UPPER GASTROINTESTINAL ENDOSCOPY  09/13/2010    Family Psychiatric History: See below  Family History:  Family History  Problem Relation Age of Onset   Diabetes Mother    Hypertension Mother    Anxiety disorder Mother    Depression Mother    Drug abuse Mother    Heart disease Father    Diabetes Father    Anxiety disorder Father    Alcohol abuse Father    Depression Father    OCD Father    Thyroid  disease Sister    Healthy Brother    Healthy Daughter    Healthy Son    ADD / ADHD Son    Alcohol abuse Paternal Grandfather    Depression Paternal Grandfather    Cancer Paternal Grandfather        lung,skin   Tuberculosis Paternal Grandfather    Seizures Paternal Grandmother    Lupus Paternal Grandmother    ADD / ADHD Son    Anesthesia problems Neg Hx    Malignant hyperthermia Neg Hx    Pseudochol deficiency Neg Hx    Hypotension Neg Hx    Bipolar disorder Neg Hx    Dementia Neg Hx    Paranoid behavior Neg Hx    Schizophrenia Neg Hx    Sexual abuse Neg Hx    Physical abuse Neg Hx     Social History:  Social History   Socioeconomic History   Marital status: Divorced    Spouse name: Not on file   Number of children: 3   Years of  education: GED   Highest education level: Not on file  Occupational History    Employer: NOT EMPLOYED  Tobacco Use   Smoking status: Former    Current packs/day: 0.00    Average packs/day: 1.5 packs/day for 25.0 years (37.5 ttl pk-yrs)    Types: Cigarettes    Start date: 04/25/1988    Quit date: 04/25/2013  Years since quitting: 11.2    Passive exposure: Never   Smokeless tobacco: Never  Vaping Use   Vaping status: Every Day   Start date: 04/22/2013   Substances: Nicotine , Flavoring  Substance and Sexual Activity   Alcohol use: No   Drug use: No   Sexual activity: Yes    Birth control/protection: Surgical, Abstinence  Other Topics Concern   Not on file  Social History Narrative   Divorced since 1994.Lives with boyfriend of 11 years.On disability.   Social Drivers of Health   Tobacco Use: Medium Risk (07/18/2024)   Patient History    Smoking Tobacco Use: Former    Smokeless Tobacco Use: Never    Passive Exposure: Never  Programmer, Applications: Not on file  Food Insecurity: Not on file  Transportation Needs: Not on file  Physical Activity: Not on file  Stress: Not on file  Social Connections: Not on file  Depression (PHQ2-9): High Risk (04/14/2024)   Depression (PHQ2-9)    PHQ-2 Score: 19  Alcohol Screen: Not on file  Housing: Not on file  Utilities: Not on file  Health Literacy: Not on file    Allergies: Allergies[1]  Metabolic Disorder Labs: Lab Results  Component Value Date   HGBA1C 11.4 (H) 08/03/2020   MPG 280 08/03/2020   MPG 237.43 08/11/2018   No results found for: PROLACTIN Lab Results  Component Value Date   CHOL 153 04/25/2019   TRIG 141 04/25/2019   HDL 37 04/25/2019   CHOLHDL 4.9 03/06/2015   VLDL 16 03/06/2015   LDLCALC 91 04/25/2019   LDLCALC 104 (H) 03/06/2015   Lab Results  Component Value Date   TSH 4.616 (H) 05/23/2022   TSH 2.79 08/03/2020    Therapeutic Level Labs: No results found for: LITHIUM No results found  for: VALPROATE No results found for: CBMZ  Current Medications: Current Outpatient Medications  Medication Sig Dispense Refill   ARIPiprazole  (ABILIFY ) 5 MG tablet Take 1 tablet (5 mg total) by mouth daily. 30 tablet 2   ACCU-CHEK GUIDE test strip USE TO CHECK BLOOD SUGAR FOUR TIMES DAILY     Accu-Chek Softclix Lancets lancets CHECK SUGAR ONCE DAILY     amphetamine -dextroamphetamine  (ADDERALL) 20 MG tablet Take 1 tablet (20 mg total) by mouth 2 (two) times daily. 60 tablet 0   amphetamine -dextroamphetamine  (ADDERALL) 20 MG tablet Take 1 tablet (20 mg total) by mouth 2 (two) times daily. 60 tablet 0   amphetamine -dextroamphetamine  (ADDERALL) 20 MG tablet Take 1 tablet (20 mg total) by mouth 2 (two) times daily. 60 tablet 0   azaTHIOprine  (IMURAN ) 50 MG tablet Take 1 tablet (50 mg total) by mouth daily. 90 tablet 1   bimatoprost (LUMIGAN) 0.01 % SOLN Place 1 drop into both eyes at bedtime.     Calcium  Citrate-Vitamin D3 (CALCIUM  CITRATE +D) 315-6.25 MG-MCG TABS Take 1 tablet by mouth 2 (two) times daily.     cholecalciferol (VITAMIN D3) 25 MCG (1000 UNIT) tablet Take 2,000 Units by mouth daily.     cycloSPORINE (RESTASIS) 0.05 % ophthalmic emulsion Place 1 drop into both eyes 2 (two) times daily.     dicyclomine  (BENTYL ) 10 MG capsule Take 1 capsule (10 mg total) by mouth 2 (two) times daily as needed. 60 capsule 2   esomeprazole  (NEXIUM ) 40 MG capsule TAKE 1 CAPSULE BY MOUTH DAILY AT 12 NOON 90 capsule 0   famotidine  (PEPCID ) 20 MG tablet Take 1 tablet (20 mg total) by mouth at bedtime. (Patient not taking:  Reported on 07/14/2024) 30 tablet 3   FARXIGA 10 MG TABS tablet Take 10 mg by mouth daily.     fexofenadine (ALLEGRA) 180 MG tablet Take 180 mg by mouth daily.     gabapentin  (NEURONTIN ) 100 MG capsule Take 100 mg by mouth 3 (three) times daily.     HYDROcodone-acetaminophen  (NORCO/VICODIN) 5-325 MG tablet Take 1 tablet by mouth 2 (two) times daily as needed for moderate pain. 0.5 -1 po  bid prn     hydroxychloroquine  (PLAQUENIL ) 200 MG tablet Take 1 tablet (200 mg total) by mouth 2 (two) times daily. 180 tablet 1   isosorbide  mononitrate (IMDUR ) 30 MG 24 hr tablet Take 30 mg by mouth every morning.     lisinopril  (ZESTRIL ) 40 MG tablet Take 40 mg by mouth daily.     metFORMIN  (GLUCOPHAGE ) 500 MG tablet Take 500 mg by mouth 2 (two) times daily.     methocarbamol  (ROBAXIN ) 500 MG tablet Take 500 mg by mouth 2 (two) times daily as needed for muscle spasms.     metoCLOPramide  (REGLAN ) 5 MG tablet Take 1 tablet (5 mg total) by mouth 3 (three) times daily before meals. 210 tablet 3   Multiple Vitamins-Minerals (CENTRUM) tablet Take 1 tablet by mouth daily.     nitroGLYCERIN  (NITROLINGUAL ) 0.4 MG/SPRAY spray Place 1 spray under the tongue every 5 (five) minutes x 3 doses as needed for chest pain.     OZEMPIC, 1 MG/DOSE, 4 MG/3ML SOPN Inject 1 mg into the skin once a week.     PARoxetine  (PAXIL ) 40 MG tablet Take 1 tablet (40 mg total) by mouth at bedtime. 90 tablet 2   Polyvinyl Alcohol-Povidone (REFRESH OP) Place 1 drop into both eyes daily as needed (dry eyes).     prazosin  (MINIPRESS ) 2 MG capsule TAKE 1 CAPSULE(2 MG) BY MOUTH AT BEDTIME 30 capsule 2   PRESCRIPTION MEDICATION Take 10 mg by mouth 3 (three) times daily before meals. This is MOTILLIUM ( DOMPERIDONE 10 mg) Tablet     PREVIDENT 5000 PLUS 1.1 % CREA dental cream Place 1 Application onto teeth every morning.     Probiotic Product (PROBIOTIC PO) Take 1 capsule by mouth daily.     propafenone (RYTHMOL SR) 225 MG 12 hr capsule Take 225 mg by mouth every 12 (twelve) hours.     REPATHA 140 MG/ML SOSY Inject 1 mL into the skin every 14 (fourteen) days.     rosuvastatin (CRESTOR) 40 MG tablet Take 40 mg by mouth daily.     spironolactone (ALDACTONE) 25 MG tablet Take 25 mg by mouth daily.     No current facility-administered medications for this visit.     Musculoskeletal: Strength & Muscle Tone: within normal limits Gait  & Station: normal Patient leans: N/A  Psychiatric Specialty Exam: Review of Systems  Constitutional:  Positive for fatigue.  Psychiatric/Behavioral:  Positive for dysphoric mood.   All other systems reviewed and are negative.   There were no vitals taken for this visit.There is no height or weight on file to calculate BMI.  General Appearance: Casual and Fairly Groomed  Eye Contact:  Good  Speech:  Clear and Coherent  Volume:  Normal  Mood:  Dysphoric at times generally euthymic  Affect:  Congruent  Thought Process:  Goal Directed  Orientation:  Full (Time, Place, and Person)  Thought Content: WDL   Suicidal Thoughts:  No  Homicidal Thoughts:  No  Memory:  Immediate;   Good Recent;   Good  Remote;   NA  Judgement:  Good  Insight:  Good  Psychomotor Activity:  Normal  Concentration:  Concentration: Good and Attention Span: Good  Recall:  Good  Fund of Knowledge: Good  Language: Good  Akathisia:  No  Handed:  Right  AIMS (if indicated): not done  Assets:  Communication Skills Desire for Improvement Resilience Social Support Talents/Skills  ADL's:  Intact  Cognition: WNL  Sleep:  Good   Screenings: GAD-7    Advertising Copywriter from 04/14/2024 in [facility] [place] Counselor from 11/26/2023 in [facility] [place] Office Visit from 05/22/2023 in [facility] [place] Office Visit from 04/23/2023 in [facility] [place] Counselor from 09/04/2022 in [facility] at [place]  Total GAD-7 Score 14 14 7 16 19    PHQ2-9    Flowsheet Row Counselor from 04/14/2024 in [facility] [place] Counselor from 11/26/2023 in [facility] at [place] Office Visit from 05/22/2023 in [facility] at [place] Office Visit from 04/23/2023  in [facility] at [place] Counselor from 09/04/2022 in [facility] at [facility] Total Score 6 5 2 6 6   PHQ-9 Total Score 19 16 6 19 17    Flowsheet Row Counselor from 04/14/2024 in [facility] [place] Counselor from 11/26/2023 in [facility] [place] Office Visit from 04/23/2023 in [facility] at [place]  C-SSRS RISK CATEGORY Error: Question 6 not populated Error: Question 6 not populated No Risk     Assessment and Plan: This patient is a 54 year old female with a history of posttraumatic stress disorder nightmares major depression generalized anxiety and ADHD.  She still has occasional periods of depression so we will increase Abilify  to 5 mg daily for augmentation of Paxil  40 mg daily for major depression.  She will continue prazosin  2 mg at bedtime for nightmares and Adderall 20 mg twice daily for ADD.  She will return to see me in 3 months  Collaboration of Care: Collaboration of Care: Referral or follow-up with counselor/therapist AEB patient will continue therapy with Jerel Pepper in our office  Patient/Guardian was advised Release of Information must be obtained prior to any record release in order to collaborate their care with an outside provider. Patient/Guardian was advised if they have not already done so to contact the registration department to sign all necessary forms in order for us  to release information regarding their care.   Consent: Patient/Guardian gives verbal consent for treatment and assignment of benefits for services provided during this visit. Patient/Guardian expressed understanding and agreed to proceed.    Barnie Gull, MD 07/31/2024, 2:49 PM     [1]  Allergies Allergen Reactions   Doxycycline Shortness Of Breath and Rash    Heart racing   Fentanyl Shortness Of Breath, Itching and Other (See  Comments)    Heart racing, SOB   Cefoxitin     Unknown reaction   Ceftin [Cefuroxime Axetil]     Unknown reaction   Iodinated Contrast Media Other (See Comments)    Knots on body   Iohexol  Other (See Comments)    Knots on body    Norvasc [Amlodipine Besylate]     Unknown reaction   Trintellix  [Vortioxetine ]     Tremors, heart race   Wellbutrin [Bupropion Hcl] Nausea And  Vomiting   Latex Rash   Tape Rash

## 2024-08-03 ENCOUNTER — Ambulatory Visit: Payer: Self-pay | Admitting: Internal Medicine

## 2024-08-07 ENCOUNTER — Telehealth (INDEPENDENT_AMBULATORY_CARE_PROVIDER_SITE_OTHER): Payer: Self-pay

## 2024-08-07 NOTE — Telephone Encounter (Signed)
 Patient called to reschedule due to weather/road conditions and upcoming weather this weekend. Rescheduled to 08/27/2024 at 11:15am. Patient has prep. Sending updated instructions through the mail.

## 2024-08-08 ENCOUNTER — Other Ambulatory Visit: Payer: Self-pay | Admitting: Internal Medicine

## 2024-08-08 DIAGNOSIS — M79641 Pain in right hand: Secondary | ICD-10-CM

## 2024-08-08 DIAGNOSIS — R7689 Other specified abnormal immunological findings in serum: Secondary | ICD-10-CM

## 2024-08-12 ENCOUNTER — Encounter (HOSPITAL_COMMUNITY)

## 2024-08-18 ENCOUNTER — Ambulatory Visit (HOSPITAL_COMMUNITY): Admitting: Clinical

## 2024-08-27 ENCOUNTER — Ambulatory Visit (HOSPITAL_COMMUNITY): Admit: 2024-08-27 | Admitting: Gastroenterology

## 2024-08-27 ENCOUNTER — Encounter (HOSPITAL_COMMUNITY): Payer: Self-pay

## 2024-09-02 ENCOUNTER — Ambulatory Visit: Admitting: Physician Assistant

## 2024-10-13 ENCOUNTER — Ambulatory Visit: Admitting: Internal Medicine

## 2025-03-25 ENCOUNTER — Ambulatory Visit: Admitting: Podiatry
